# Patient Record
Sex: Male | Born: 1960
Health system: Southern US, Community
[De-identification: ages and names within clinical notes are randomized; demographics above are authoritative.]

## PROBLEM LIST (undated history)

## (undated) DIAGNOSIS — M109 Gout, unspecified: Secondary | ICD-10-CM

## (undated) DIAGNOSIS — I10 Essential (primary) hypertension: Secondary | ICD-10-CM

## (undated) DIAGNOSIS — E119 Type 2 diabetes mellitus without complications: Secondary | ICD-10-CM

## (undated) DIAGNOSIS — G8929 Other chronic pain: Secondary | ICD-10-CM

## (undated) DIAGNOSIS — M25569 Pain in unspecified knee: Secondary | ICD-10-CM

## (undated) DIAGNOSIS — E785 Hyperlipidemia, unspecified: Secondary | ICD-10-CM

## (undated) DIAGNOSIS — Z8739 Personal history of other diseases of the musculoskeletal system and connective tissue: Secondary | ICD-10-CM

## (undated) HISTORY — PX: COLONOSCOPY: SHX174

## (undated) HISTORY — PX: NO PAST SURGERIES: SHX2092

---

## 1999-01-11 ENCOUNTER — Encounter: Payer: Self-pay | Admitting: Emergency Medicine

## 1999-01-11 ENCOUNTER — Emergency Department (HOSPITAL_COMMUNITY): Admission: EM | Admit: 1999-01-11 | Discharge: 1999-01-11 | Payer: Self-pay | Admitting: Emergency Medicine

## 1999-08-12 ENCOUNTER — Emergency Department (HOSPITAL_COMMUNITY): Admission: EM | Admit: 1999-08-12 | Discharge: 1999-08-12 | Payer: Self-pay | Admitting: Emergency Medicine

## 2001-10-23 ENCOUNTER — Emergency Department (HOSPITAL_COMMUNITY): Admission: EM | Admit: 2001-10-23 | Discharge: 2001-10-23 | Payer: Self-pay | Admitting: Emergency Medicine

## 2001-10-23 ENCOUNTER — Encounter: Payer: Self-pay | Admitting: Emergency Medicine

## 2002-01-27 ENCOUNTER — Encounter: Payer: Self-pay | Admitting: Emergency Medicine

## 2002-01-27 ENCOUNTER — Emergency Department (HOSPITAL_COMMUNITY): Admission: EM | Admit: 2002-01-27 | Discharge: 2002-01-27 | Payer: Self-pay | Admitting: Emergency Medicine

## 2003-06-17 ENCOUNTER — Emergency Department (HOSPITAL_COMMUNITY): Admission: EM | Admit: 2003-06-17 | Discharge: 2003-06-17 | Payer: Self-pay | Admitting: *Deleted

## 2003-08-24 ENCOUNTER — Emergency Department (HOSPITAL_COMMUNITY): Admission: EM | Admit: 2003-08-24 | Discharge: 2003-08-24 | Payer: Self-pay | Admitting: Emergency Medicine

## 2003-09-15 ENCOUNTER — Emergency Department (HOSPITAL_COMMUNITY): Admission: EM | Admit: 2003-09-15 | Discharge: 2003-09-15 | Payer: Self-pay | Admitting: Emergency Medicine

## 2004-04-16 ENCOUNTER — Emergency Department (HOSPITAL_COMMUNITY): Admission: EM | Admit: 2004-04-16 | Discharge: 2004-04-16 | Payer: Self-pay | Admitting: Emergency Medicine

## 2004-10-04 ENCOUNTER — Emergency Department (HOSPITAL_COMMUNITY): Admission: EM | Admit: 2004-10-04 | Discharge: 2004-10-04 | Payer: Self-pay | Admitting: Emergency Medicine

## 2005-11-27 ENCOUNTER — Emergency Department (HOSPITAL_COMMUNITY): Admission: EM | Admit: 2005-11-27 | Discharge: 2005-11-27 | Payer: Self-pay | Admitting: Emergency Medicine

## 2005-12-07 ENCOUNTER — Emergency Department (HOSPITAL_COMMUNITY): Admission: EM | Admit: 2005-12-07 | Discharge: 2005-12-07 | Payer: Self-pay | Admitting: Emergency Medicine

## 2009-12-18 ENCOUNTER — Inpatient Hospital Stay (HOSPITAL_COMMUNITY): Admission: EM | Admit: 2009-12-18 | Discharge: 2009-12-23 | Payer: Self-pay | Admitting: Emergency Medicine

## 2010-05-21 ENCOUNTER — Emergency Department (HOSPITAL_COMMUNITY): Admission: EM | Admit: 2010-05-21 | Discharge: 2010-05-21 | Payer: Self-pay | Admitting: Emergency Medicine

## 2011-02-03 LAB — DIFFERENTIAL
Basophils Absolute: 0 10*3/uL (ref 0.0–0.1)
Basophils Absolute: 0 10*3/uL (ref 0.0–0.1)
Basophils Relative: 0 % (ref 0–1)
Eosinophils Absolute: 0 10*3/uL (ref 0.0–0.7)
Eosinophils Absolute: 0 10*3/uL (ref 0.0–0.7)
Eosinophils Relative: 0 % (ref 0–5)
Eosinophils Relative: 0 % (ref 0–5)
Lymphocytes Relative: 25 % (ref 12–46)
Lymphs Abs: 1.9 10*3/uL (ref 0.7–4.0)
Monocytes Absolute: 0.5 10*3/uL (ref 0.1–1.0)
Monocytes Absolute: 1.1 10*3/uL — ABNORMAL HIGH (ref 0.1–1.0)
Monocytes Relative: 9 % (ref 3–12)
Neutrophils Relative %: 69 % (ref 43–77)

## 2011-02-03 LAB — POCT I-STAT, CHEM 8
BUN: 14 mg/dL (ref 6–23)
Calcium, Ion: 1.12 mmol/L (ref 1.12–1.32)
Chloride: 108 mEq/L (ref 96–112)
HCT: 42 % (ref 39.0–52.0)
Hemoglobin: 14.3 g/dL (ref 13.0–17.0)
TCO2: 24 mmol/L (ref 0–100)

## 2011-02-03 LAB — SEDIMENTATION RATE: Sed Rate: 102 mm/hr — ABNORMAL HIGH (ref 0–16)

## 2011-02-03 LAB — CBC
Hemoglobin: 12.4 g/dL — ABNORMAL LOW (ref 13.0–17.0)
MCH: 24.4 pg — ABNORMAL LOW (ref 26.0–34.0)
MCHC: 32.3 g/dL (ref 30.0–36.0)
MCV: 75 fL — ABNORMAL LOW (ref 78.0–100.0)
MCV: 76.6 fL — ABNORMAL LOW (ref 78.0–100.0)
RBC: 5.05 MIL/uL (ref 4.22–5.81)
RBC: 4.99 MIL/uL (ref 4.22–5.81)
RDW: 15.2 % (ref 11.5–15.5)
WBC: 7.8 10*3/uL (ref 4.0–10.5)
WBC: 12 10*3/uL — ABNORMAL HIGH (ref 4.0–10.5)

## 2011-02-03 LAB — GLUCOSE, CAPILLARY

## 2011-02-03 LAB — BASIC METABOLIC PANEL
Creatinine, Ser: 0.95 mg/dL (ref 0.4–1.5)
GFR calc Af Amer: >60 mL/min (ref >60)
Glucose, Bld: 161 mg/dL — ABNORMAL HIGH (ref 70–99)

## 2011-02-06 LAB — IRON AND TIBC
Iron: 24 ug/dL — ABNORMAL LOW (ref 42–135)
Saturation Ratios: 14 % — ABNORMAL LOW (ref 20–55)
TIBC: 175 ug/dL — ABNORMAL LOW (ref 215–435)

## 2011-02-06 LAB — CBC
HCT: 32.6 % — ABNORMAL LOW (ref 39.0–52.0)
HCT: 35.6 % — ABNORMAL LOW (ref 39.0–52.0)
Hemoglobin: 10.8 g/dL — ABNORMAL LOW (ref 13.0–17.0)
Hemoglobin: 11 g/dL — ABNORMAL LOW (ref 13.0–17.0)
MCHC: 32.3 g/dL (ref 30.0–36.0)
MCHC: 32.9 g/dL (ref 30.0–36.0)
MCV: 76.2 fL — ABNORMAL LOW (ref 78.0–100.0)
MCV: 76.4 fL — ABNORMAL LOW (ref 78.0–100.0)
MCV: 76.8 fL — ABNORMAL LOW (ref 78.0–100.0)
Platelets: 272 10*3/uL (ref 150–400)
Platelets: 302 10*3/uL (ref 150–400)
RDW: 14.7 % (ref 11.5–15.5)
RDW: 14.8 % (ref 11.5–15.5)
RDW: 15 % (ref 11.5–15.5)
WBC: 10.9 10*3/uL — ABNORMAL HIGH (ref 4.0–10.5)

## 2011-02-06 LAB — DIFFERENTIAL
Basophils Absolute: 0 10*3/uL (ref 0.0–0.1)
Basophils Absolute: 0 10*3/uL (ref 0.0–0.1)
Basophils Relative: 0 % (ref 0–1)
Eosinophils Absolute: 0 10*3/uL (ref 0.0–0.7)
Eosinophils Absolute: 0 10*3/uL (ref 0.0–0.7)
Eosinophils Relative: 0 % (ref 0–5)
Lymphocytes Relative: 22 % (ref 12–46)
Monocytes Absolute: 0.6 10*3/uL (ref 0.1–1.0)
Monocytes Absolute: 0.7 10*3/uL (ref 0.1–1.0)
Neutro Abs: 10.8 10*3/uL — ABNORMAL HIGH (ref 1.7–7.7)

## 2011-02-06 LAB — URINALYSIS, MICROSCOPIC ONLY
Glucose, UA: NEGATIVE mg/dL
Ketones, ur: NEGATIVE mg/dL
Leukocytes, UA: NEGATIVE
Nitrite: NEGATIVE
Protein, ur: NEGATIVE mg/dL
Urobilinogen, UA: 1 mg/dL (ref 0.0–1.0)

## 2011-02-06 LAB — GLUCOSE, CAPILLARY
Glucose-Capillary: 109 mg/dL — ABNORMAL HIGH (ref 70–99)
Glucose-Capillary: 121 mg/dL — ABNORMAL HIGH (ref 70–99)
Glucose-Capillary: 124 mg/dL — ABNORMAL HIGH (ref 70–99)
Glucose-Capillary: 134 mg/dL — ABNORMAL HIGH (ref 70–99)
Glucose-Capillary: 141 mg/dL — ABNORMAL HIGH (ref 70–99)
Glucose-Capillary: 141 mg/dL — ABNORMAL HIGH (ref 70–99)
Glucose-Capillary: 167 mg/dL — ABNORMAL HIGH (ref 70–99)
Glucose-Capillary: 174 mg/dL — ABNORMAL HIGH (ref 70–99)
Glucose-Capillary: 183 mg/dL — ABNORMAL HIGH (ref 70–99)

## 2011-02-06 LAB — BASIC METABOLIC PANEL
BUN: 17 mg/dL (ref 6–23)
BUN: 17 mg/dL (ref 6–23)
CO2: 26 mEq/L (ref 19–32)
CO2: 27 mEq/L (ref 19–32)
Calcium: 8.8 mg/dL (ref 8.4–10.5)
Chloride: 103 mEq/L (ref 96–112)
Creatinine, Ser: 0.77 mg/dL (ref 0.4–1.5)
Glucose, Bld: 142 mg/dL — ABNORMAL HIGH (ref 70–99)
Glucose, Bld: 97 mg/dL (ref 70–99)
Potassium: 4 mEq/L (ref 3.5–5.1)
Sodium: 136 mEq/L (ref 135–145)

## 2011-02-06 LAB — RENAL FUNCTION PANEL
Albumin: 2.3 g/dL — ABNORMAL LOW (ref 3.5–5.2)
CO2: 27 mEq/L (ref 19–32)
GFR calc Af Amer: >60 mL/min (ref >60)
Glucose, Bld: 134 mg/dL — ABNORMAL HIGH (ref 70–99)
Potassium: 4 mEq/L (ref 3.5–5.1)

## 2011-02-06 LAB — URINE CULTURE
Colony Count: NO GROWTH
Culture: NO GROWTH

## 2011-02-06 LAB — ANAEROBIC CULTURE
Gram Stain: NONE SEEN
Gram Stain: NONE SEEN

## 2011-02-06 LAB — HEMOGLOBIN A1C: Hgb A1c MFr Bld: 8.3 % — ABNORMAL HIGH (ref 4.6–6.1)

## 2011-02-06 LAB — SYNOVIAL CELL COUNT + DIFF, W/ CRYSTALS: Crystals, Fluid: NONE SEEN

## 2011-02-06 LAB — BODY FLUID CULTURE
Culture: NO GROWTH
Gram Stain: NONE SEEN

## 2011-02-06 LAB — RETICULOCYTES: RBC.: 4.79 MIL/uL (ref 4.22–5.81)

## 2011-06-14 ENCOUNTER — Inpatient Hospital Stay (HOSPITAL_COMMUNITY)
Admission: EM | Admit: 2011-06-14 | Discharge: 2011-06-20 | DRG: 638 | Disposition: A | Discharge status: Home / Self Care | Payer: Self-pay | Attending: Internal Medicine | Admitting: Internal Medicine

## 2011-06-14 DIAGNOSIS — Z79899 Other long term (current) drug therapy: Secondary | ICD-10-CM

## 2011-06-14 DIAGNOSIS — K5289 Other specified noninfective gastroenteritis and colitis: Secondary | ICD-10-CM | POA: Diagnosis present

## 2011-06-14 DIAGNOSIS — E785 Hyperlipidemia, unspecified: Secondary | ICD-10-CM | POA: Diagnosis present

## 2011-06-14 DIAGNOSIS — N179 Acute kidney failure, unspecified: Secondary | ICD-10-CM | POA: Diagnosis present

## 2011-06-14 DIAGNOSIS — E1310 Other specified diabetes mellitus with ketoacidosis without coma: Principal | ICD-10-CM | POA: Diagnosis present

## 2011-06-14 DIAGNOSIS — E860 Dehydration: Secondary | ICD-10-CM | POA: Diagnosis present

## 2011-06-14 DIAGNOSIS — K219 Gastro-esophageal reflux disease without esophagitis: Secondary | ICD-10-CM | POA: Diagnosis present

## 2011-06-14 DIAGNOSIS — M109 Gout, unspecified: Secondary | ICD-10-CM | POA: Diagnosis present

## 2011-06-14 DIAGNOSIS — I10 Essential (primary) hypertension: Secondary | ICD-10-CM | POA: Diagnosis present

## 2011-06-14 LAB — POCT I-STAT 3, VENOUS BLOOD GAS (G3P V)
Patient temperature: 98
TCO2: 14 mmol/L (ref 0–100)
pCO2, Ven: 28.4 mmHg — ABNORMAL LOW (ref 45.0–50.0)
pH, Ven: 7.255 (ref 7.250–7.300)

## 2011-06-14 LAB — DIFFERENTIAL
Basophils Absolute: 0 10*3/uL (ref 0.0–0.1)
Basophils Relative: 0 % (ref 0–1)
Eosinophils Absolute: 0 10*3/uL (ref 0.0–0.7)
Eosinophils Relative: 0 % (ref 0–5)
Monocytes Absolute: 0.3 10*3/uL (ref 0.1–1.0)
Monocytes Relative: 5 % (ref 3–12)

## 2011-06-14 LAB — CBC
MCH: 25 pg — ABNORMAL LOW (ref 26.0–34.0)
MCHC: 34.6 g/dL (ref 30.0–36.0)
Platelets: 222 10*3/uL (ref 150–400)
RDW: 14 % (ref 11.5–15.5)

## 2011-06-14 LAB — GLUCOSE, CAPILLARY: Glucose-Capillary: >600 mg/dL (ref 70–99)

## 2011-06-15 ENCOUNTER — Emergency Department (HOSPITAL_COMMUNITY): Payer: Self-pay

## 2011-06-15 LAB — BASIC METABOLIC PANEL
BUN: 49 mg/dL — ABNORMAL HIGH (ref 6–23)
BUN: 37 mg/dL — ABNORMAL HIGH (ref 6–23)
Calcium: 9.4 mg/dL (ref 8.4–10.5)
Chloride: 88 mEq/L — ABNORMAL LOW (ref 96–112)
Creatinine, Ser: 1.74 mg/dL — ABNORMAL HIGH (ref 0.50–1.35)
GFR calc Af Amer: >60 mL/min (ref >60)
GFR calc non Af Amer: 42 mL/min — ABNORMAL LOW (ref >60)
Glucose, Bld: 260 mg/dL — ABNORMAL HIGH (ref 70–99)
Glucose, Bld: 409 mg/dL — ABNORMAL HIGH (ref 70–99)
Potassium: 3.4 mEq/L — ABNORMAL LOW (ref 3.5–5.1)
Sodium: 127 mEq/L — ABNORMAL LOW (ref 135–145)

## 2011-06-15 LAB — GLUCOSE, CAPILLARY
Glucose-Capillary: 210 mg/dL — ABNORMAL HIGH (ref 70–99)
Glucose-Capillary: 264 mg/dL — ABNORMAL HIGH (ref 70–99)
Glucose-Capillary: 304 mg/dL — ABNORMAL HIGH (ref 70–99)
Glucose-Capillary: 326 mg/dL — ABNORMAL HIGH (ref 70–99)
Glucose-Capillary: 374 mg/dL — ABNORMAL HIGH (ref 70–99)
Glucose-Capillary: 426 mg/dL — ABNORMAL HIGH (ref 70–99)
Glucose-Capillary: >600 mg/dL (ref 70–99)

## 2011-06-15 LAB — URINALYSIS, ROUTINE W REFLEX MICROSCOPIC
Ketones, ur: 40 mg/dL — AB
Leukocytes, UA: NEGATIVE
Nitrite: NEGATIVE
Protein, ur: NEGATIVE mg/dL
pH: 5.5 (ref 5.0–8.0)

## 2011-06-15 LAB — COMPREHENSIVE METABOLIC PANEL
Albumin: 4.2 g/dL (ref 3.5–5.2)
BUN: 62 mg/dL — ABNORMAL HIGH (ref 6–23)
Calcium: 10.3 mg/dL (ref 8.4–10.5)
Chloride: 71 mEq/L — ABNORMAL LOW (ref 96–112)
Creatinine, Ser: 2.2 mg/dL — ABNORMAL HIGH (ref 0.50–1.35)
Total Bilirubin: 0.4 mg/dL (ref 0.3–1.2)

## 2011-06-15 LAB — LIPASE, BLOOD: Lipase: 153 U/L — ABNORMAL HIGH (ref 11–59)

## 2011-06-15 LAB — CK TOTAL AND CKMB (NOT AT ARMC)
CK, MB: 4.3 ng/mL — ABNORMAL HIGH (ref 0.3–4.0)
Total CK: 122 U/L (ref 7–232)

## 2011-06-15 LAB — URINE MICROSCOPIC-ADD ON

## 2011-06-16 LAB — BASIC METABOLIC PANEL
CO2: 24 mEq/L (ref 19–32)
Chloride: 96 mEq/L (ref 96–112)
Creatinine, Ser: 1.21 mg/dL (ref 0.50–1.35)
GFR calc Af Amer: >60 mL/min (ref >60)
GFR calc non Af Amer: >60 mL/min (ref >60)
Glucose, Bld: 428 mg/dL — ABNORMAL HIGH (ref 70–99)
Potassium: 4.1 mEq/L (ref 3.5–5.1)
Sodium: 129 mEq/L — ABNORMAL LOW (ref 135–145)

## 2011-06-16 LAB — CBC
HCT: 36.7 % — ABNORMAL LOW (ref 39.0–52.0)
MCV: 69.4 fL — ABNORMAL LOW (ref 78.0–100.0)
Platelets: 163 10*3/uL (ref 150–400)
RBC: 5.29 MIL/uL (ref 4.22–5.81)
WBC: 8.9 10*3/uL (ref 4.0–10.5)

## 2011-06-16 LAB — GLUCOSE, CAPILLARY
Glucose-Capillary: 311 mg/dL — ABNORMAL HIGH (ref 70–99)
Glucose-Capillary: 252 mg/dL — ABNORMAL HIGH (ref 70–99)

## 2011-06-16 LAB — HEMOGLOBIN A1C: Hgb A1c MFr Bld: 17.1 % — ABNORMAL HIGH (ref <5.7)

## 2011-06-17 LAB — GLUCOSE, CAPILLARY
Glucose-Capillary: >600 mg/dL (ref 70–99)
Glucose-Capillary: 370 mg/dL — ABNORMAL HIGH (ref 70–99)
Glucose-Capillary: 329 mg/dL — ABNORMAL HIGH (ref 70–99)

## 2011-06-17 LAB — CBC
MCH: 24.2 pg — ABNORMAL LOW (ref 26.0–34.0)
MCHC: 34.3 g/dL (ref 30.0–36.0)
MCV: 70.6 fL — ABNORMAL LOW (ref 78.0–100.0)
Platelets: 134 10*3/uL — ABNORMAL LOW (ref 150–400)
RDW: 13.5 % (ref 11.5–15.5)
WBC: 7.9 10*3/uL (ref 4.0–10.5)

## 2011-06-17 LAB — BASIC METABOLIC PANEL
BUN: 15 mg/dL (ref 6–23)
Calcium: 9.4 mg/dL (ref 8.4–10.5)
Creatinine, Ser: 1.02 mg/dL (ref 0.50–1.35)
GFR calc Af Amer: >60 mL/min (ref >60)
GFR calc non Af Amer: >60 mL/min (ref >60)

## 2011-06-18 LAB — GLUCOSE, CAPILLARY
Glucose-Capillary: 370 mg/dL — ABNORMAL HIGH (ref 70–99)
Glucose-Capillary: 274 mg/dL — ABNORMAL HIGH (ref 70–99)
Glucose-Capillary: 239 mg/dL — ABNORMAL HIGH (ref 70–99)

## 2011-06-18 LAB — LIPID PANEL
Cholesterol: 192 mg/dL (ref 0–200)
Triglycerides: 181 mg/dL — ABNORMAL HIGH (ref <150)

## 2011-06-18 LAB — TSH: TSH: 1.24 u[IU]/mL (ref 0.350–4.500)

## 2011-06-19 LAB — GLUCOSE, CAPILLARY
Glucose-Capillary: 332 mg/dL — ABNORMAL HIGH (ref 70–99)
Glucose-Capillary: 249 mg/dL — ABNORMAL HIGH (ref 70–99)

## 2011-06-20 LAB — BASIC METABOLIC PANEL
CO2: 26 mEq/L (ref 19–32)
Calcium: 9.5 mg/dL (ref 8.4–10.5)
Creatinine, Ser: 0.99 mg/dL (ref 0.50–1.35)

## 2011-06-20 LAB — GLUCOSE, CAPILLARY: Glucose-Capillary: 311 mg/dL — ABNORMAL HIGH (ref 70–99)

## 2011-06-23 NOTE — Discharge Summary (Signed)
[name], [name]                ACCOUNT NO.:  [id number]  MEDICAL RECORD NO.:  [id number]  LOCATION:  2924                         FACILITY:  [facility]  PHYSICIAN:  Sherryl Manges, M.D.  DATE OF BIRTH:  1961/09/15  DATE OF ADMISSION:  06/14/2011 DATE OF DISCHARGE:  06/20/2011                              DISCHARGE SUMMARY   PRIMARY MD:  None.  DISCHARGE DIAGNOSES: 1. Newly diagnosed type 2 diabetes mellitus. 2. Diabetic ketoacidosis, secondary to newly diagnosed type 2 diabetes     mellitus above. 3. Acute gastroenteritis, likely precipitant of diabetic ketoacidosis. 4. Hypertension. 5. Gout.  DISCHARGE MEDICATIONS: 1. Aspirin enteric-coated 81 mg p.o. daily OTC. 2. NovoLog mix (70/30) 40 units subcutaneously b.i.d. with meals. 3. Insulin syringes 50 units as directed. 4. Metformin 500 mg p.o. b.i.d. with meals. 5. Simvastatin 10 mg p.o. daily. 6. Lisinopril 5 mg p.o. daily (was on 10 mg p.o. daily). 7. Allopurinol 100 mg p.o. daily. 8. Atenolol 50 mg p.o. daily. 9. Diclofenac 75 mg p.o. p.r.n. for knee pain. 10.Prevacid 15 mg p.o. daily.  Note:  Promethazine and hydrochlorothiazide have been discontinued.  PROCEDURES:  Chest x-ray, June 15, 2011.  This was a normal study.  CONSULTATIONS:  None.  ADMISSION HISTORY:  As in H and P notes of June 14, 2011, dictated by Dr. Orvan Falconer.  However, in brief, this is a 50 year old male, with known history of hypertension and gout, who presents to the emergency department with a history of nausea, vomiting, and diarrhea for 2 days, increasing weakness, polydipsia, polyuria, lightheadedness without fever or chills.  On initial evaluation in the emergency department, he was found to have a random blood glucose of 894, creatinine 2.2, bicarbonate 10, and potassium 5.5.  He was admitted for further evaluation, investigation, and management.  CLINICAL COURSE: 1. Diabetic ketoacidosis.  The patient has no known prior history of  diabetes mellitus.  He presents as described above and was found to     have a random blood glucose of 894 and acidosis.  He was managed     initially with glucose stabilizer protocol and aggressive     intravenous fluid hydration, with improvement in blood glucose     levels.  He was transitioned to NovoLog mix (70/30) and dosage was     titrated according to CBGs.  Appropriate diet was instituted.  As     of June 20, 2011, with judicious titration, his CBGs were down to     192.  Further improvement is anticipated.  2. Type 2 diabetes mellitus.  As described above, this is a new     diagnosis.  The patient underwent diabetic teaching during the     course of this hospitalization and his hemoglobin A1c was found to     be 17.1.  3. Acute gastroenteritis.  This was a culprit for #1 above and     responded to intravenous fluid hydration, antiemetics, and proton     pump inhibitors.  As of June 19, 2011, the patient was     asymptomatic. Likely, this was viral in origin and self-limited.  4. Hypertension.  The patient was managed with a combination of  beta-     blocker and low-dose ACE inhibitor.  5. Gout.  The patient remained asymptomatic from this viewpoint.  DISPOSITION:  The patient was on June 20, 2011, asymptomatic, was very keen to be discharged with no new issues, and was discharged accordingly.  ACTIVITY:  As tolerated.  DIET:  Heart healthy/carbohydrate modified.  FOLLOWUP INSTRUCTIONS:  The patient has been arranged a followup appointment with [facility].  I believe it will be on July 08, 2011, at 2:30 p.m.  Telephone [phone].  After that, he will follow up with Dr. Minna Antis at [facility] on July 15, 2011, at 2:30 p.m.  Telephone [phone].  All this has been communicated to the patient, he verbalized understanding.     Sherryl Manges, M.D.     CO/MEDQ  D:  06/20/2011  T:  06/21/2011  Job:  [id number]  cc:   Dr. Minna Antis  Electronically Signed by  Sherryl Manges M.D. on 06/23/2011 06:57:12 PM

## 2011-07-09 NOTE — H&P (Signed)
[name], [name]                ACCOUNT NO.:  [id number]  MEDICAL RECORD NO.:  [id number]  LOCATION:  [facility]                         FACILITY:  [facility]  PHYSICIAN:  Orvan Falconer, MD           DATE OF BIRTH:  1960-12-22  DATE OF ADMISSION:  06/14/2011 DATE OF DISCHARGE:                             HISTORY & PHYSICAL   PRIMARY CARE PHYSICIAN:  None.  ADVANCE DIRECTIVE:  Full code.  REASON FOR ADMISSION:  Hyperosmolar hyperglycemia with possible DKA.  HISTORY OF PRESENT ILLNESS:  This is a 50 year old male with history of hypertension and gout, presents to the emergency room complaining of nausea, vomiting and loose stool for 2 days.  He had been feeling very weak, having polydipsia and polyuria and lightheadedness.  He denied any fever or chills, cough, chest pain or shortness of breath.  He had never been diagnosed with diabetes before.  Evaluation in the emergency room showed blood glucose of 894 with creatinine of 2.2, bicarb of 10.  His potassium is 5.5.  His urinalysis is positive for glycosuria but otherwise no evidence of infection.  A venous pH was done and it was 7.23.  He has a white count of 7.0 thousand, hemoglobin of 14.5.  He has not had a chest x-ray, EKG or cardiac markers performed in the emergency room.  Hospitalist was asked to admit this patient for DKA.  PAST MEDICAL HISTORY:  Hypertension and gout.  CURRENT MEDICATIONS:  Allopurinol, atenolol, hydrochlorothiazide, lisinopril, Prevacid, Phenergan.  FAMILY HISTORY:  Father with diabetes.  Mother is alive and well.  SOCIAL HISTORY:  He works in [employer].  He is not married.  He has no children.  Denied tobacco, alcohol or drug use.  ALLERGIES:  No known drug allergies.  PHYSICAL EXAMINATION:  VITAL SIGNS:  Blood pressure 120/70, pulse of 68, respiratory rate of 20, temperature 98.0. GENERAL:  He is alert, orient and conversing but he appeared to be tired.  Skin turgor was poor. HEENT:  Sclerae are  nonicteric. NECK:  No lymphadenopathy. CARDIAC:  S1 and S2 regular.  No murmur, rub or gallop. LUNGS:  Clear.  No wheezes, rales or any evidence of consolidation. ABDOMEN:  Soft, nondistended, nontender.  No right upper quadrant tenderness.  No ascites. EXTREMITIES:  No edema.  He has no joint swelling or rash.  No calf tenderness. SKIN:  Warm and dry.  No evidence of shock. NEUROLOGIC:  Unremarkable. PSYCHIATRIC:  Unremarkable as well.  OBJECTIVE FINDINGS:  Serum sodium of 120, potassium of 5.5, chloride 71, bicarb of 10, blood glucose of 894, creatinine 2.2, BUN of 62.  Liver function tests are normal.  Venous pH is 7.25.  White count of 7.0 thousand, hemoglobin of 14.5, platelet count of 222,000.  IMPRESSION:  This is a 50 year old male presents with what appeared to be DKA, but because he had diarrhea, he might have had metabolic acidosis from the diarrhea, and clinically, he presents more like hyperosmolar hyperglycemia.  In any event, he will need fluid and insulin with more need for fluid than insulin.  We will admit him to the step-down unit. Because MI is a possible common cause  for hyperglycemia, he will need EKG and cardiac markers.  We will also get a chest x-ray to make sure he do not have occult pneumonia.  He is quite ill but stable.  We will use glucose stabilizer and IV fluid and we will admit him to [facility].  He is a full code.  When his creatinine normalizes, and he is able to have his medications reconciled, we will resume his medications pending improvements of his kidneys function.     Orvan Falconer, MD     PL/MEDQ  D:  06/15/2011  T:  06/15/2011  Job:  [id number]  Electronically Signed by Orvan Falconer  on 07/09/2011 10:09:57 PM

## 2013-07-15 ENCOUNTER — Emergency Department (HOSPITAL_COMMUNITY): Payer: Self-pay

## 2013-07-15 ENCOUNTER — Emergency Department (HOSPITAL_COMMUNITY)
Admission: EM | Admit: 2013-07-15 | Discharge: 2013-07-15 | Disposition: A | Discharge status: Home / Self Care | Payer: Self-pay | Attending: Emergency Medicine | Admitting: Emergency Medicine

## 2013-07-15 ENCOUNTER — Encounter (HOSPITAL_COMMUNITY): Payer: Self-pay | Admitting: Emergency Medicine

## 2013-07-15 DIAGNOSIS — M7021 Olecranon bursitis, right elbow: Secondary | ICD-10-CM

## 2013-07-15 DIAGNOSIS — M7020 Olecranon bursitis, unspecified elbow: Secondary | ICD-10-CM | POA: Insufficient documentation

## 2013-07-15 DIAGNOSIS — W2209XA Striking against other stationary object, initial encounter: Secondary | ICD-10-CM | POA: Insufficient documentation

## 2013-07-15 DIAGNOSIS — Y9389 Activity, other specified: Secondary | ICD-10-CM | POA: Insufficient documentation

## 2013-07-15 DIAGNOSIS — E119 Type 2 diabetes mellitus without complications: Secondary | ICD-10-CM | POA: Insufficient documentation

## 2013-07-15 DIAGNOSIS — I10 Essential (primary) hypertension: Secondary | ICD-10-CM | POA: Insufficient documentation

## 2013-07-15 DIAGNOSIS — Y92009 Unspecified place in unspecified non-institutional (private) residence as the place of occurrence of the external cause: Secondary | ICD-10-CM | POA: Insufficient documentation

## 2013-07-15 HISTORY — DX: Essential (primary) hypertension: I10

## 2013-07-15 LAB — CBC WITH DIFFERENTIAL/PLATELET
Basophils Absolute: 0 10*3/uL (ref 0.0–0.1)
Basophils Relative: 0 % (ref 0–1)
Eosinophils Absolute: 0 10*3/uL (ref 0.0–0.7)
Eosinophils Relative: 0 % (ref 0–5)
HCT: 38.9 % — ABNORMAL LOW (ref 39.0–52.0)
Hemoglobin: 12.3 g/dL — ABNORMAL LOW (ref 13.0–17.0)
Lymphocytes Relative: 20 % (ref 12–46)
Lymphs Abs: 1.9 10*3/uL (ref 0.7–4.0)
MCH: 23.9 pg — ABNORMAL LOW (ref 26.0–34.0)
MCHC: 31.6 g/dL (ref 30.0–36.0)
MCV: 75.5 fL — ABNORMAL LOW (ref 78.0–100.0)
Monocytes Absolute: 0.9 10*3/uL (ref 0.1–1.0)
Monocytes Relative: 9 % (ref 3–12)
Neutro Abs: 6.8 10*3/uL (ref 1.7–7.7)
Neutrophils Relative %: 71 % (ref 43–77)
Platelets: 171 10*3/uL (ref 150–400)
RBC: 5.15 MIL/uL (ref 4.22–5.81)
RDW: 15.6 % — ABNORMAL HIGH (ref 11.5–15.5)
WBC: 9.6 10*3/uL (ref 4.0–10.5)

## 2013-07-15 LAB — SEDIMENTATION RATE: Sed Rate: 21 mm/hr — ABNORMAL HIGH (ref 0–16)

## 2013-07-15 LAB — C-REACTIVE PROTEIN: CRP: 4.5 mg/dL — ABNORMAL HIGH (ref <0.60)

## 2013-07-15 MED ORDER — LEVOFLOXACIN 500 MG PO TABS
750.0000 mg | ORAL_TABLET | Freq: Once | ORAL | Status: AC
  Administered 2013-07-15: 750 mg via ORAL
  Filled 2013-07-15: qty 2
  Filled 2013-07-15: qty 1

## 2013-07-15 MED ORDER — KETOROLAC TROMETHAMINE 30 MG/ML IJ SOLN
30.0000 mg | Freq: Once | INTRAMUSCULAR | Status: AC
  Administered 2013-07-15: 30 mg via INTRAMUSCULAR
  Filled 2013-07-15: qty 1

## 2013-07-15 MED ORDER — OXYCODONE-ACETAMINOPHEN 5-325 MG PO TABS: 1.0000 | ORAL_TABLET | ORAL | Status: DC | PRN

## 2013-07-15 MED ORDER — BUPIVACAINE HCL (PF) 0.5 % IJ SOLN
10.0000 mL | Freq: Once | INTRAMUSCULAR | Status: AC
  Administered 2013-07-15: 10 mL
  Filled 2013-07-15: qty 30

## 2013-07-15 MED ORDER — LEVOFLOXACIN 500 MG PO TABS: 500.0000 mg | ORAL_TABLET | Freq: Every day | ORAL | Status: DC

## 2013-07-15 MED ORDER — CEPHALEXIN 500 MG PO CAPS
500.0000 mg | ORAL_CAPSULE | Freq: Once | ORAL | Status: AC
  Administered 2013-07-15: 500 mg via ORAL
  Filled 2013-07-15: qty 1

## 2013-07-15 NOTE — ED Notes (Signed)
Pt presenting to ed with c/o hitting right elbow on rail [place] his apartment [place] x days ago with worsening pain and swelling today pt states difficulty with raising his arm up today

## 2013-07-15 NOTE — ED Provider Notes (Signed)
CSN: [id number]     Arrival date & time 07/15/13  1455 History   First MD Initiated Contact with Patient 07/15/13 1510     Chief Complaint  Patient presents with  . abnormal bloodwork    (Consider location/radiation/quality/duration/timing/severity/associated sxs/prior Treatment) HPI  52 year old male called by myself to come back for further evaluation and workup. Gram stain from early teen sample showed gram variable rods. Possible contaminants. Gram-negative should not be though. Patient reports symptoms have not significantly changed since last evaluation. He is not immunocompromised. Is not systemically ill. Remains afebrile. Will obtain some baseline labs. Will tx empirically until culture resulted.     Past Medical History  Diagnosis Date  . Diabetes mellitus without complication   . Hypertension    History reviewed. No pertinent past surgical history. No family history on file. History  Substance Use Topics  . Smoking status: Never Smoker   . Smokeless tobacco: Not on file  . Alcohol Use: No    Review of Systems  All systems reviewed and negative, other than as noted in HPI.   Allergies  Review of patient's allergies indicates no known allergies.  Home Medications   Current Outpatient Rx  Name  Route  Sig  Dispense  Refill  . oxyCODONE-acetaminophen (PERCOCET/ROXICET) 5-325 MG per tablet   Oral   Take 1-2 tablets by mouth every 4 (four) hours as needed for pain.   17 tablet   0    BP 163/99  Pulse 73  Resp 19  SpO2 99% Physical Exam  Nursing note and vitals reviewed. Constitutional: He appears well-developed and well-nourished. No distress.  HENT:  Head: Normocephalic and atraumatic.  Eyes: Conjunctivae are normal. Right eye exhibits no discharge. Left eye exhibits no discharge.  Neck: Neck supple.  Cardiovascular: Normal rate, regular rhythm and normal heart sounds.  Exam reveals no gallop and no friction rub.   No murmur heard. Pulmonary/Chest:  Effort normal and breath sounds normal. No respiratory distress.  Abdominal: Soft. He exhibits no distension. There is no tenderness.  Musculoskeletal:  Exam consistent with earlier today. ROM somewhat improved. Remains worse with flexion. Mild erythema and fullness over olecranon.   Neurological: He is alert.  Skin: Skin is warm and dry.  Psychiatric: He has a normal mood and affect. His behavior is normal. Thought content normal.    ED Course  Procedures (including critical care time) Labs Review Labs Reviewed  CBC WITH DIFFERENTIAL - Abnormal; Notable for the following:    Hemoglobin 12.3 (*)    HCT 38.9 (*)    MCV 75.5 (*)    MCH 23.9 (*)    RDW 15.6 (*)    All other components within normal limits  SEDIMENTATION RATE - Abnormal; Notable for the following:    Sed Rate 21 (*)    All other components within normal limits  C-REACTIVE PROTEIN - Abnormal; Notable for the following:    CRP 4.5 (*)    All other components within normal limits  CULTURE, BLOOD (ROUTINE X 2)  CULTURE, BLOOD (ROUTINE X 2)   Imaging Review No results found.  MDM   1. Olecranon bursitis of right elbow     Assessment as above. Continue NSAIDs/pain meds. Script for levaquin. Return precautions discussed. Ortho FU.   Virgel Manifold, MD 07/22/13 [phone]

## 2013-07-15 NOTE — ED Notes (Signed)
Pt was seen in ED earlier and was called by [facility] EDP to come back into ED for abnormal lab work.

## 2013-07-15 NOTE — ED Provider Notes (Addendum)
CSN: [id number]     Arrival date & time 07/15/13  0715 History   First MD Initiated Contact with Patient 07/15/13 0725     Chief Complaint  Patient presents with  . Elbow Pain   (Consider location/radiation/quality/duration/timing/severity/associated sxs/prior Treatment) HPI  52 year old male with right elbow pain. On Monday patient bumped his arm against the wall. He noticed no laceration or abrasion. Had some mild localized discomfort at initially but nothing particularly concerning to him. Over the past 2 days he's had increasing pain, swelling and some redness over the elbow. No fevers or chills. No numbness, tingling or loss of strength. Has tried taking ibuprofen with some relief.  Past Medical History  Diagnosis Date  . Diabetes mellitus without complication   . Hypertension    History reviewed. No pertinent past surgical history. No family history on file. History  Substance Use Topics  . Smoking status: Never Smoker   . Smokeless tobacco: Not on file  . Alcohol Use: No    Review of Systems  All systems reviewed and negative, other than as noted in HPI.   Allergies  Review of patient's allergies indicates no known allergies.  Home Medications  No current outpatient prescriptions on file. BP 194/97  Pulse 77  Temp(Src) 98.9 F (37.2 C) (Oral)  Resp 20  SpO2 99% Physical Exam  Nursing note and vitals reviewed. Constitutional: He appears well-developed and well-nourished. No distress.  HENT:  Head: Normocephalic and atraumatic.  Eyes: Conjunctivae are normal. Right eye exhibits no discharge. Left eye exhibits no discharge.  Neck: Neck supple.  Cardiovascular: Normal rate, regular rhythm and normal heart sounds.  Exam reveals no gallop and no friction rub.   No murmur heard. Pulmonary/Chest: Effort normal and breath sounds normal. No respiratory distress.  Abdominal: Soft. He exhibits no distension. There is no tenderness.  Musculoskeletal: He exhibits no  edema and no tenderness.  Right elbow is erythematous, has increased warmth and swelling over the olecranon. Patient is able to fully extend at the elbow albeit with pain. Pain is increased with flexion. No breaks in skin noted. Neurovascular intact distally.  Neurological: He is alert.  Skin: Skin is warm and dry.  Psychiatric: He has a normal mood and affect. His behavior is normal. Thought content normal.    ED Course  Procedures (including critical care time) Labs Review  Apiration of blood/fluid Performed by: Virgel Manifold Consent obtained. Required items: required blood products, implants, devices, and special equipment available Patient identity confirmed: verbally with patient Time out: Immediately prior to procedure a "time out" was called to verify the correct patient, procedure, equipment, support staff and site/side marked as required. Preparation: Patient was prepped and draped in the usual sterile fashion.  Procedure:Pt's elbow was flexed to 90 degrees resting on bedside table. Small wheal made w/ 1% lidocaine. 18g needle used and directed at most dependent area. 3cc of viscous straw colored fluid obtained and sent for culture.   Patient tolerance: Patient tolerated the procedure well with no immediate complications.  Location of aspiration: R olecranon bursa  Injection of joint Date/Time: 07/15/2013 8:10 AM Performed by: Virgel Manifold Authorized by: Virgel Manifold Consent: Verbal consent obtained. Risks and benefits: risks, benefits and alternatives were discussed Consent given by: patient Required items: required blood products, implants, devices, and special equipment available Patient identity confirmed: verbally with patient and arm band Time out: Immediately prior to procedure a "time out" was called to verify the correct patient, procedure, equipment, support staff and site/side  marked as required. Preparation: Patient was prepped and draped in the usual  sterile fashion. Local anesthesia used: yes Anesthesia: local infiltration Local anesthetic: 50/50 mix of lidocaine 1% without epinephrine and bupivacaine 0.5% without epinephrine. No steroids. Anesthetic total: 3 ml Procedure: Bursa aspirated as above. Once fluid was aspirated, needle was held in position and new syringe containing anesthetic used to inject lido/bupivicaine Patient sedated: no Patient tolerance: Patient tolerated the procedure well with no immediate complications.          Labs Reviewed  BODY FLUID CULTURE   Imaging Review No results found.  MDM   1. Olecranon bursitis of right elbow      52 year old male with right elbow pain after minor trauma on Monday. Exam consistent with olecranon bursitis. Very strong suspicion for traumatic versus less likely septic. Patient is requesting injection so he can go to work. I think this is reasonable. Anesthetic injected after aspiration. No steroids used. We'll send fluid for culture although I do not think that this is infectious. Will treat symptoms. No antibiotics at this time.    Virgel Manifold, MD 07/15/13 [phone]   2:14 PM Received call from [facility]. Reporting that gram stain showed gram variable rods. This may or may not be contaminant.  Called pt at work at [employer]. Explained results and need to come to ED for further evaluation.      Virgel Manifold, MD 07/15/13 1459

## 2013-07-18 LAB — BODY FLUID CULTURE: Special Requests: NORMAL

## 2013-07-21 LAB — CULTURE, BLOOD (ROUTINE X 2)
Culture: NO GROWTH
Culture: NO GROWTH

## 2013-10-13 ENCOUNTER — Ambulatory Visit: Payer: No Typology Code available for payment source | Attending: Internal Medicine | Admitting: Internal Medicine

## 2013-10-13 ENCOUNTER — Encounter: Payer: Self-pay | Admitting: Internal Medicine

## 2013-10-13 VITALS — BP 187/143 | HR 69 | Temp 97.8°F | Resp 14 | Ht 66.0 in | Wt 265.6 lb

## 2013-10-13 DIAGNOSIS — I10 Essential (primary) hypertension: Secondary | ICD-10-CM | POA: Insufficient documentation

## 2013-10-13 DIAGNOSIS — E119 Type 2 diabetes mellitus without complications: Secondary | ICD-10-CM | POA: Insufficient documentation

## 2013-10-13 LAB — LIPID PANEL
LDL Cholesterol: 89 mg/dL (ref 0–99)
Triglycerides: 113 mg/dL (ref <150)

## 2013-10-13 LAB — COMPLETE METABOLIC PANEL WITH GFR
AST: 28 U/L (ref 0–37)
Alkaline Phosphatase: 55 U/L (ref 39–117)
BUN: 22 mg/dL (ref 6–23)
Creat: 1.18 mg/dL (ref 0.50–1.35)

## 2013-10-13 LAB — CK TOTAL AND CKMB (NOT AT ARMC)
Relative Index: 2.1 (ref 0.0–4.0)
Total CK: 481 U/L — ABNORMAL HIGH (ref 7–232)

## 2013-10-13 LAB — URIC ACID: Uric Acid, Serum: 9.9 mg/dL — ABNORMAL HIGH (ref 4.0–7.8)

## 2013-10-13 LAB — CBC WITH DIFFERENTIAL/PLATELET
Basophils Absolute: 0 10*3/uL (ref 0.0–0.1)
Basophils Relative: 0 % (ref 0–1)
HCT: 42.7 % (ref 39.0–52.0)
MCHC: 31.9 g/dL (ref 30.0–36.0)
Monocytes Absolute: 0.6 10*3/uL (ref 0.1–1.0)
Neutro Abs: 3.4 10*3/uL (ref 1.7–7.7)
Neutrophils Relative %: 56 % (ref 43–77)
RDW: 17.4 % — ABNORMAL HIGH (ref 11.5–15.5)

## 2013-10-13 MED ORDER — INSULIN ASPART PROT & ASPART (70-30 MIX) 100 UNIT/ML ~~LOC~~ SUSP: 40.0000 [IU] | Freq: Two times a day (BID) | SUBCUTANEOUS | Status: DC

## 2013-10-13 MED ORDER — SIMVASTATIN 20 MG PO TABS: 20.0000 mg | ORAL_TABLET | Freq: Every evening | ORAL | Status: DC

## 2013-10-13 MED ORDER — MELOXICAM 15 MG PO TABS: 15.0000 mg | ORAL_TABLET | Freq: Every day | ORAL | Status: DC

## 2013-10-13 MED ORDER — TRIAMTERENE-HCTZ 37.5-25 MG PO TABS: 1.0000 | ORAL_TABLET | Freq: Every day | ORAL | Status: DC

## 2013-10-13 MED ORDER — TRAMADOL HCL 50 MG PO TABS: 50.0000 mg | ORAL_TABLET | Freq: Four times a day (QID) | ORAL | Status: DC | PRN

## 2013-10-13 MED ORDER — METFORMIN HCL 500 MG PO TABS: 500.0000 mg | ORAL_TABLET | Freq: Two times a day (BID) | ORAL | Status: DC

## 2013-10-13 MED ORDER — ATENOLOL 50 MG PO TABS: 50.0000 mg | ORAL_TABLET | Freq: Every day | ORAL | Status: DC

## 2013-10-13 MED ORDER — DICLOFENAC SODIUM 75 MG PO TBEC: 75.0000 mg | DELAYED_RELEASE_TABLET | Freq: Two times a day (BID) | ORAL | Status: DC

## 2013-10-13 NOTE — Progress Notes (Signed)
Pt is here to establish care. Pt is hypertensive and a diabetic. Hasn't had an HBA1c x2 years. Pt requests check up.

## 2013-10-14 LAB — VITAMIN D 25 HYDROXY (VIT D DEFICIENCY, FRACTURES): Vit D, 25-Hydroxy: 19 ng/mL — ABNORMAL LOW (ref 30–89)

## 2014-01-13 ENCOUNTER — Ambulatory Visit: Payer: Self-pay | Admitting: Internal Medicine

## 2015-03-17 ENCOUNTER — Encounter (HOSPITAL_COMMUNITY): Payer: Self-pay | Admitting: Emergency Medicine

## 2015-03-17 ENCOUNTER — Emergency Department (HOSPITAL_COMMUNITY)
Admission: EM | Admit: 2015-03-17 | Discharge: 2015-03-17 | Disposition: A | Discharge status: Home / Self Care | Payer: Worker's Compensation | Attending: Emergency Medicine | Admitting: Emergency Medicine

## 2015-03-17 ENCOUNTER — Emergency Department (HOSPITAL_COMMUNITY): Payer: Worker's Compensation

## 2015-03-17 DIAGNOSIS — Z79899 Other long term (current) drug therapy: Secondary | ICD-10-CM | POA: Diagnosis not present

## 2015-03-17 DIAGNOSIS — T1490XA Injury, unspecified, initial encounter: Secondary | ICD-10-CM

## 2015-03-17 DIAGNOSIS — S6991XA Unspecified injury of right wrist, hand and finger(s), initial encounter: Secondary | ICD-10-CM | POA: Diagnosis present

## 2015-03-17 DIAGNOSIS — Y9289 Other specified places as the place of occurrence of the external cause: Secondary | ICD-10-CM | POA: Insufficient documentation

## 2015-03-17 DIAGNOSIS — Z791 Long term (current) use of non-steroidal anti-inflammatories (NSAID): Secondary | ICD-10-CM | POA: Diagnosis not present

## 2015-03-17 DIAGNOSIS — Y990 Civilian activity done for income or pay: Secondary | ICD-10-CM | POA: Diagnosis not present

## 2015-03-17 DIAGNOSIS — Z794 Long term (current) use of insulin: Secondary | ICD-10-CM | POA: Diagnosis not present

## 2015-03-17 DIAGNOSIS — Y9389 Activity, other specified: Secondary | ICD-10-CM | POA: Diagnosis not present

## 2015-03-17 DIAGNOSIS — S6391XA Sprain of unspecified part of right wrist and hand, initial encounter: Secondary | ICD-10-CM | POA: Diagnosis not present

## 2015-03-17 DIAGNOSIS — I10 Essential (primary) hypertension: Secondary | ICD-10-CM | POA: Diagnosis not present

## 2015-03-17 DIAGNOSIS — E119 Type 2 diabetes mellitus without complications: Secondary | ICD-10-CM | POA: Diagnosis not present

## 2015-03-17 DIAGNOSIS — W228XXA Striking against or struck by other objects, initial encounter: Secondary | ICD-10-CM | POA: Diagnosis not present

## 2015-03-17 MED ORDER — IBUPROFEN 800 MG PO TABS
800.0000 mg | ORAL_TABLET | Freq: Once | ORAL | Status: AC
  Administered 2015-03-17: 800 mg via ORAL
  Filled 2015-03-17: qty 1

## 2015-03-17 MED ORDER — MELOXICAM 15 MG PO TABS: 15.0000 mg | ORAL_TABLET | Freq: Every day | ORAL | Status: DC

## 2015-03-17 NOTE — Discharge Instructions (Signed)
Joint Sprain A sprain is a tear or stretch in the ligaments that hold a joint together. Severe sprains may need as long as 3-6 weeks of immobilization and/or exercises to heal completely. Sprained joints should be rested and protected. If not, they can become unstable and prone to re-injury. Proper treatment can reduce your pain, shorten the period of disability, and reduce the risk of repeated injuries. TREATMENT   Rest and elevate the injured joint to reduce pain and swelling.  Apply ice packs to the injury for 20-30 minutes every 2-3 hours for the next 2-3 days.  Keep the injury wrapped in a compression bandage or splint as long as the joint is painful or as instructed by your caregiver.  Do not use the injured joint until it is completely healed to prevent re-injury and chronic instability. Follow the instructions of your caregiver.  Long-term sprain management may require exercises and/or treatment by a physical therapist. Taping or special braces may help stabilize the joint until it is completely better. SEEK MEDICAL CARE IF:   You develop increased pain or swelling of the joint.  You develop increasing redness and warmth of the joint.  You develop a fever.  It becomes stiff.  Your hand or foot gets cold or numb. Document Released: 12/12/2004 Document Revised: 01/27/2012 Document Reviewed: 11/21/2008 [facility] Patient Information 2015 [place], [place]. This information is not intended to replace advice given to you by your health care provider. Make sure you discuss any questions you have with your health care provider.

## 2015-03-17 NOTE — Progress Notes (Signed)
Orthopedic Tech Progress Note Patient Details:  [name] 09-05-1961 [id number]  Ortho Devices Type of Ortho Device: Ace wrap, Velcro wrist splint Ortho Device/Splint Interventions: Application   Cammer, Theodoro Parma 03/17/2015, 7:01 PM

## 2015-03-17 NOTE — ED Provider Notes (Signed)
CSN: [id number]     Arrival date & time 03/17/15  1549 History  This chart was scribed for [name] Moras, PA-C, working with Wandra Arthurs, MD by Marti Sleigh, ED Scribe. This patient was seen in room [name] and the patient's care was started at 4:42 PM.    Chief Complaint  Patient presents with  . Wrist Pain    Patient is a 54 y.o. male presenting with wrist pain. The history is provided by the patient. No language interpreter was used.  Wrist Pain  HPI Comments: [name] is a 54 y.o. male who presents to the Emergency Department complaining of right hand pain with associated swelling for the last week. Pt states he hit his hand on some coffee mugs at work one week ago and has had pain and limited movement ever since. Pt states at the time he put ice on his  hand. Pt was seen by PCP for his right hand injury and his PCP ordered a right hand x-ray at [facility], but pt had reported to the ED due to worsening pain and. Pt states he is unable to bend his fingers due to pain. Pt states he has taken ibuprofen with some relief of his pain.   Past Medical History  Diagnosis Date  . Diabetes mellitus without complication   . Hypertension    History reviewed. No pertinent past surgical history. Family History  Problem Relation Age of Onset  . Diabetes Mother   . Diabetes Father    History  Substance Use Topics  . Smoking status: Never Smoker   . Smokeless tobacco: Not on file  . Alcohol Use: No    Review of Systems  Constitutional: Negative for fever and chills.  Musculoskeletal: Positive for joint swelling and arthralgias.  Skin: Negative for color change and wound.  Neurological: Positive for weakness (Secondary to pain). Negative for numbness.    Allergies  Review of patient's allergies indicates no known allergies.  Home Medications   Prior to Admission medications   Medication Sig Start Date End Date Taking? Authorizing Provider  atenolol (TENORMIN) 50 MG  tablet Take 1 tablet (50 mg total) by mouth daily. 10/13/13   Reyne Dumas, MD  diclofenac (VOLTAREN) 75 MG EC tablet Take 1 tablet (75 mg total) by mouth 2 (two) times daily. 10/13/13   Reyne Dumas, MD  insulin aspart protamine- aspart (NOVOLOG MIX 70/30) (70-30) 100 UNIT/ML injection Inject 0.4 mLs (40 Units total) into the skin 2 (two) times daily with a meal. 10/13/13   Reyne Dumas, MD  meloxicam (MOBIC) 15 MG tablet Take 1 tablet (15 mg total) by mouth daily. 10/13/13   Reyne Dumas, MD  metFORMIN (GLUCOPHAGE) 500 MG tablet Take 1 tablet (500 mg total) by mouth 2 (two) times daily with a meal. 10/13/13   Reyne Dumas, MD  oxyCODONE-acetaminophen (PERCOCET/ROXICET) 5-325 MG per tablet Take 1-2 tablets by mouth every 4 (four) hours as needed for pain. 07/15/13   Virgel Manifold, MD  simvastatin (ZOCOR) 20 MG tablet Take 1 tablet (20 mg total) by mouth every evening. 10/13/13   Reyne Dumas, MD  traMADol (ULTRAM) 50 MG tablet Take 1 tablet (50 mg total) by mouth every 6 (six) hours as needed. 10/13/13   Reyne Dumas, MD  triamterene-hydrochlorothiazide (MAXZIDE-25) 37.5-25 MG per tablet Take 1 tablet by mouth daily. 10/13/13   Reyne Dumas, MD   BP 137/82 mmHg  Pulse 85  Temp(Src) 99.1 F (37.3 C) (Oral)  Resp 18  SpO2  99% Physical Exam  Constitutional: He is oriented to person, place, and time. He appears well-developed and well-nourished. No distress.  HENT:  Head: Normocephalic and atraumatic.  Eyes: Pupils are equal, round, and reactive to light.  Neck: Neck supple.  Cardiovascular: Normal rate.   Pulmonary/Chest: Effort normal. No respiratory distress.  Musculoskeletal: Normal range of motion.  Right eolbow and shoulder intact. Right radial pulse intact. Decreased right wrist flexion and extension due to pain, normal supination and pronation. Hand is moderately tender throughout. No bruising noted. Tenderness noted to right anatomical snuff box. Cap refill intact.   Neurological: He  is alert and oriented to person, place, and time. Coordination normal.  Skin: Skin is warm and dry. He is not diaphoretic.  Psychiatric: He has a normal mood and affect. His behavior is normal.  Nursing note and vitals reviewed.   ED Course  Procedures  DIAGNOSTIC STUDIES: Oxygen Saturation is 99% on RA, normal by my interpretation.    COORDINATION OF CARE: 4:48 PM Discussed treatment plan with pt at bedside and pt agreed to plan.  6:40 PM X-ray of right wrist and right hand shows no acute abnormalities. Suspects hand/wrist sprain from the injury. Rice therapy discussed. Wrist brace provide for support.  Labs Review Labs Reviewed - No data to display  Imaging Review Dg Wrist Complete Right  03/17/2015   CLINICAL DATA:  Initial encounter for trauma 1 week ago. Pain centered at wrist and up to second metacarpal/MCP joint.  EXAM: RIGHT WRIST - COMPLETE 3+ VIEW  COMPARISON:  Hand films of same date and hand films of 05/21/2010  FINDINGS: No acute fracture or dislocation. Scaphoid intact. Mild degenerate changes involve the radial aspect of the carpus with joint space narrowing and subchondral sclerosis.  IMPRESSION: No acute osseous abnormality.   Electronically Signed   By: Abigail Miyamoto M.D.   On: 03/17/2015 18:21   Dg Hand Complete Right  03/17/2015   CLINICAL DATA:  Initial encounter for trauma to right hand/wrist 1 week ago with pain centered about the wrist and second digit.  EXAM: RIGHT HAND - COMPLETE 3+ VIEW  COMPARISON:  Wrist films same date in the hand films of 05/21/2010  FINDINGS: Overlap of fingers on the lateral view. No acute fracture or dislocation. Degenerate changes involve metacarpal phalangeal joints, most marked at the third.  IMPRESSION: No acute osseous abnormality.   Electronically Signed   By: Abigail Miyamoto M.D.   On: 03/17/2015 18:24     EKG Interpretation None      MDM   Final diagnoses:  Injury  Hand sprain, right, initial encounter    BP 128/80 mmHg   Pulse 66  Temp(Src) 98.8 F (37.1 C) (Oral)  Resp 16  SpO2 100%  I have reviewed nursing notes and vital signs. I personally reviewed the imaging tests through PACS system  I reviewed available ER/hospitalization records thought the EMR   I personally performed the services described in this documentation, which was scribed in my presence. The recorded information has been reviewed and is accurate.     [name] Moras, PA-C 03/17/15 Rewey Yao, MD 03/18/15 [phone]

## 2015-03-17 NOTE — ED Notes (Signed)
Pt reports right wrist swelling/pain post injury last week.

## 2015-10-17 ENCOUNTER — Inpatient Hospital Stay (HOSPITAL_COMMUNITY)
Admission: EM | Admit: 2015-10-17 | Discharge: 2015-10-19 | DRG: 558 | Disposition: A | Discharge status: Home / Self Care | Payer: Self-pay | Attending: Internal Medicine | Admitting: Internal Medicine

## 2015-10-17 ENCOUNTER — Emergency Department (HOSPITAL_COMMUNITY): Payer: Self-pay

## 2015-10-17 ENCOUNTER — Encounter (HOSPITAL_COMMUNITY): Payer: Self-pay | Admitting: Emergency Medicine

## 2015-10-17 DIAGNOSIS — E109 Type 1 diabetes mellitus without complications: Secondary | ICD-10-CM | POA: Diagnosis present

## 2015-10-17 DIAGNOSIS — M10021 Idiopathic gout, right elbow: Secondary | ICD-10-CM | POA: Diagnosis present

## 2015-10-17 DIAGNOSIS — E119 Type 2 diabetes mellitus without complications: Secondary | ICD-10-CM

## 2015-10-17 DIAGNOSIS — I10 Essential (primary) hypertension: Secondary | ICD-10-CM | POA: Diagnosis present

## 2015-10-17 DIAGNOSIS — Z7984 Long term (current) use of oral hypoglycemic drugs: Secondary | ICD-10-CM

## 2015-10-17 DIAGNOSIS — M109 Gout, unspecified: Secondary | ICD-10-CM | POA: Diagnosis present

## 2015-10-17 DIAGNOSIS — Z794 Long term (current) use of insulin: Secondary | ICD-10-CM

## 2015-10-17 DIAGNOSIS — E1149 Type 2 diabetes mellitus with other diabetic neurological complication: Secondary | ICD-10-CM

## 2015-10-17 DIAGNOSIS — M71121 Other infective bursitis, right elbow: Principal | ICD-10-CM | POA: Diagnosis present

## 2015-10-17 DIAGNOSIS — Z79899 Other long term (current) drug therapy: Secondary | ICD-10-CM

## 2015-10-17 DIAGNOSIS — E785 Hyperlipidemia, unspecified: Secondary | ICD-10-CM | POA: Diagnosis present

## 2015-10-17 HISTORY — DX: Type 2 diabetes mellitus without complications: E11.9

## 2015-10-17 HISTORY — DX: Hyperlipidemia, unspecified: E78.5

## 2015-10-17 HISTORY — DX: Personal history of other diseases of the musculoskeletal system and connective tissue: Z87.39

## 2015-10-17 LAB — CBC WITH DIFFERENTIAL/PLATELET
Basophils Absolute: 0 10*3/uL (ref 0.0–0.1)
Basophils Relative: 0 %
EOS PCT: 1 %
Eosinophils Absolute: 0.1 10*3/uL (ref 0.0–0.7)
HCT: 38.2 % — ABNORMAL LOW (ref 39.0–52.0)
Hemoglobin: 12.3 g/dL — ABNORMAL LOW (ref 13.0–17.0)
LYMPHS ABS: 1.8 10*3/uL (ref 0.7–4.0)
LYMPHS PCT: 17 %
MCH: 23.6 pg — AB (ref 26.0–34.0)
MCHC: 32.2 g/dL (ref 30.0–36.0)
MCV: 73.3 fL — AB (ref 78.0–100.0)
MONO ABS: 1 10*3/uL (ref 0.1–1.0)
MONOS PCT: 9 %
Neutro Abs: 7.9 10*3/uL — ABNORMAL HIGH (ref 1.7–7.7)
Neutrophils Relative %: 73 %
PLATELETS: 262 10*3/uL (ref 150–400)
RBC: 5.21 MIL/uL (ref 4.22–5.81)
RDW: 16.6 % — AB (ref 11.5–15.5)
WBC: 10.9 10*3/uL — ABNORMAL HIGH (ref 4.0–10.5)

## 2015-10-17 LAB — BASIC METABOLIC PANEL
Anion gap: 12 (ref 5–15)
BUN: 19 mg/dL (ref 6–20)
CO2: 24 mmol/L (ref 22–32)
CREATININE: 1.18 mg/dL (ref 0.61–1.24)
Calcium: 9.8 mg/dL (ref 8.9–10.3)
Chloride: 98 mmol/L — ABNORMAL LOW (ref 101–111)
GFR calc Af Amer: >60 mL/min (ref >60)
GFR calc non Af Amer: >60 mL/min (ref >60)
Glucose, Bld: 101 mg/dL — ABNORMAL HIGH (ref 65–99)
POTASSIUM: 4.2 mmol/L (ref 3.5–5.1)
Sodium: 134 mmol/L — ABNORMAL LOW (ref 135–145)

## 2015-10-17 LAB — CBG MONITORING, ED: Glucose-Capillary: 151 mg/dL — ABNORMAL HIGH (ref 65–99)

## 2015-10-17 MED ORDER — MORPHINE SULFATE (PF) 4 MG/ML IV SOLN
4.0000 mg | Freq: Once | INTRAVENOUS | Status: AC
  Administered 2015-10-17: 4 mg via INTRAVENOUS
  Filled 2015-10-17: qty 1

## 2015-10-17 MED ORDER — VANCOMYCIN HCL IN DEXTROSE 1-5 GM/200ML-% IV SOLN
1000.0000 mg | Freq: Once | INTRAVENOUS | Status: DC
  Filled 2015-10-17: qty 200

## 2015-10-17 MED ORDER — LIDOCAINE HCL (PF) 1 % IJ SOLN
5.0000 mL | Freq: Once | INTRAMUSCULAR | Status: AC
  Administered 2015-10-17: 5 mL
  Filled 2015-10-17: qty 5

## 2015-10-17 MED ORDER — ACETAMINOPHEN 160 MG/5ML PO SOLN
650.0000 mg | Freq: Once | ORAL | Status: AC
Start: 2015-10-17 — End: 2015-10-17
  Administered 2015-10-17: 650 mg via ORAL
  Filled 2015-10-17: qty 20.3

## 2015-10-17 MED ORDER — LIDOCAINE HCL 1 % IJ SOLN: 10.0000 mL | Freq: Once | INTRAMUSCULAR | Status: DC

## 2015-10-17 NOTE — ED Provider Notes (Signed)
MSE was initiated and I personally evaluated the patient and placed orders (if any) at  8:27 PM on October 17, 2015. S: [name] is a 54 y.o male with a hx of gout, DM and HTN who presents to the Emergency Department complaining of a gradually worsening 8/10 right hand pain and swelling onset 8 days ago. He reports associated swelling of the right elbow. He denies going to see a provider for his hand. He states that he has been using icy hot and wrapped his lower hand in a partial cast and ace wrap. He reports that he received the partial cast from [facility] from a previous injury that occurred a while ago. He adds that he has been taking Ibuprofen with no relief and his last dose was yesterday. 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 He denies any recent falls or injuries. Pt is right handed. He denies any fever, chills.  O: Filed Vitals:   10/17/15 1831  BP: 127/76  Pulse: 89  Temp: 100.8 F (38.2 C)  Resp: 18    Musculoskeletal: Partial cast and Ace bandage was removed. Significant swelling to the right hand and elbow. 2+ radial pulse. <2 second cap refill. Unable to flex and extend finger secondary to swelling.  A: Patient is febrile and diabetic. Will need further workup to rule out septic joint/bursitis/gout.  P: Basic labs were ordered. Tylenol was ordered.  The patient appears stable so that the remainder of the MSE may be completed by another provider.  Ottie Glazier, PA-C 10/17/15 2028  Ripley Fraise, MD 10/18/15 [phone]

## 2015-10-17 NOTE — ED Provider Notes (Signed)
CSN: [id number]     Arrival date & time 10/17/15  1807 History   First MD Initiated Contact with Patient 10/17/15 1913     Chief Complaint  Patient presents with  . Hand Pain   Patient gave verbal permission to utilize photo for medical documentation only The image was not stored on any personal device   Patient is a 54 y.o. male presenting with wrist pain. The history is provided by the patient.  Wrist Pain This is a new problem. The current episode started more than 2 days ago. The problem occurs daily. The problem has been gradually worsening. The symptoms are aggravated by bending. The symptoms are relieved by rest.  Patient reports he has had right elbow/forearm/wrist pain/swelling for past week.  He denies trauma He reports it is worsening It is worsened with movement of his arm, and improved with rest He does not recall having fever He denies vomiting.   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 Diabetes mellitus without complication ([place])   . Hypertension    History reviewed. No pertinent past surgical history. Family History  Problem Relation Age of Onset  . Diabetes Mother   . Diabetes Father    Social History  Substance Use Topics  . Smoking status: Never Smoker   . Smokeless tobacco: None  . Alcohol Use: No    Review of Systems  Gastrointestinal: Negative for vomiting.  Musculoskeletal: Positive for joint swelling and arthralgias.  All other systems reviewed and are negative.     Allergies  Review of patient's allergies indicates no known allergies.  Home Medications   Prior to Admission medications   Medication Sig Start Date End Date Taking? Authorizing Provider  atenolol (TENORMIN) 50 MG tablet Take 1 tablet (50 mg total) by mouth daily. 10/13/13   Reyne Dumas, MD  diclofenac (VOLTAREN) 75 MG EC tablet Take 1 tablet (75 mg total) by mouth 2 (two) times daily. 10/13/13   Reyne Dumas, MD  insulin  aspart protamine- aspart (NOVOLOG MIX 70/30) (70-30) 100 UNIT/ML injection Inject 0.4 mLs (40 Units total) into the skin 2 (two) times daily with a meal. 10/13/13   Reyne Dumas, MD  meloxicam (MOBIC) 15 MG tablet Take 1 tablet (15 mg total) by mouth daily. 03/17/15   Domenic Moras, PA-C  metFORMIN (GLUCOPHAGE) 500 MG tablet Take 1 tablet (500 mg total) by mouth 2 (two) times daily with a meal. 10/13/13   Reyne Dumas, MD  oxyCODONE-acetaminophen (PERCOCET/ROXICET) 5-325 MG per tablet Take 1-2 tablets by mouth every 4 (four) hours as needed for pain. 07/15/13   Virgel Manifold, MD  simvastatin (ZOCOR) 20 MG tablet Take 1 tablet (20 mg total) by mouth every evening. 10/13/13   Reyne Dumas, MD  traMADol (ULTRAM) 50 MG tablet Take 1 tablet (50 mg total) by mouth every 6 (six) hours as needed. 10/13/13   Reyne Dumas, MD  triamterene-hydrochlorothiazide (MAXZIDE-25) 37.5-25 MG per tablet Take 1 tablet by mouth daily. 10/13/13   Reyne Dumas, MD   BP 110/76 mmHg  Pulse 66  Temp(Src) 100.8 F (38.2 C) (Oral)  Resp 18  Wt 108.665 kg  SpO2 95% Physical Exam CONSTITUTIONAL: Well developed/well nourished HEAD: Normocephalic/atraumatic EYES: EOMI ENMT: Mucous membranes moist NECK: supple no meningeal signs SPINE/BACK:entire spine nontender CV: S1/S2 noted, no murmurs/rubs/gallops noted LUNGS: Lungs are clear to auscultation bilaterally, no apparent distress ABDOMEN: soft, nontender, no rebound or guarding, bowel sounds noted  throughout abdomen NEURO: Pt is awake/alert/appropriate, moves all extremitiesx4.  No facial droop.   EXTREMITIES: pulses normal/equal, ROM is limited in both right elbow/wrist.  Diffuse soft tissue swelling to right elbow/forearm/wrist/hand.  No crepitus noted.  No abscess noted.  Olecranon bursitis noted See photo:  SKIN: warm, color normal PSYCH: no abnormalities of mood noted, alert and oriented to situation  ED Course  .Joint Aspiration/Arthrocentesis Date/Time: 10/17/2015  11:57 PM Performed by: Ripley Fraise Authorized by: Ripley Fraise Consent: Verbal consent obtained. Written consent obtained. Risks and benefits: risks, benefits and alternatives were discussed Consent given by: patient Patient identity confirmed: verbally with patient and provided demographic data Time out: Immediately prior to procedure a "time out" was called to verify the correct patient, procedure, equipment, support staff and site/side marked as required. Indications: joint swelling,  pain and possible septic joint  Body area: elbow Joint: right elbow Local anesthesia used: yes Anesthesia: local infiltration Local anesthetic: lidocaine 1% without epinephrine Anesthetic total: 5 ml Patient sedated: no Preparation: Patient was prepped and draped in the usual sterile fashion. Needle gauge: 18 G Aspirate: cloudy and foul-smelling Patient tolerance: Patient tolerated the procedure well with no immediate complications    [id number] AM Pt with fever/leukocytosis.  He has obvious olecranon bursitis on exam and diffuse edema on his arm Concern for septic joint Cultures ordered Consult ortho 12:20 AM D/w dr Berenice Primas with ortho Recommend monitoring and admit to medicine and he will see in hospital 12:31 AM D/w dr gardner, will admit to triad service, med-surg  Labs Review Labs Reviewed  CBC WITH DIFFERENTIAL/PLATELET - Abnormal; Notable for the following:    WBC 10.9 (*)    Hemoglobin 12.3 (*)    HCT 38.2 (*)    MCV 73.3 (*)    MCH 23.6 (*)    RDW 16.6 (*)    Neutro Abs 7.9 (*)    All other components within normal limits  BASIC METABOLIC PANEL - Abnormal; Notable for the following:    Sodium 134 (*)    Chloride 98 (*)    Glucose, Bld 101 (*)    All other components within normal limits  CBG MONITORING, ED - Abnormal; Notable for the following:    Glucose-Capillary 151 (*)    All other components within normal limits  BODY FLUID CULTURE  GRAM STAIN  CULTURE, BLOOD  (ROUTINE X 2)  CULTURE, BLOOD (ROUTINE X 2)  SYNOVIAL CELL COUNT + DIFF, W/ CRYSTALS    Imaging Review Dg Elbow Complete Right  10/17/2015  CLINICAL DATA:  54 year old male with right hand injury. Patient reports right elbow tenderness. EXAM: RIGHT ELBOW - COMPLETE 3+ VIEW COMPARISON:  None. FINDINGS: There is no evidence of fracture, dislocation, or joint effusion. A 5 mm bony spur noted from the olecranon. There is mild soft tissue thickening of the posterior elbow and proximal forearm. Clinical correlation is recommended to evaluate for olecranon bursitis. IMPRESSION: No acute fracture or dislocation. Mild soft tissues swelling over the olecranon. Clinical correlation is recommended to evaluate for olecranon bursitis. Electronically Signed   By: Anner Crete M.D.   On: 10/17/2015 20:40   Dg Wrist Complete Right  10/17/2015  CLINICAL DATA:  Injury last week EXAM: RIGHT WRIST - COMPLETE 3+ VIEW COMPARISON:  03/17/2015 FINDINGS: No acute fracture. No dislocation. Soft tissue swelling about the wrist is noted. IMPRESSION: No acute bony pathology. Electronically Signed   By: Marybelle Killings M.D.   On: 10/17/2015 20:33   Dg Hand Complete Right  10/17/2015  CLINICAL DATA:  Right handpain and swelling after recent trauma, pain is worse EXAM: RIGHT HAND - COMPLETE 3+ VIEW COMPARISON:  None. FINDINGS: Mild arthritic change third and fourth metacarpophalangeal joints. Limited evaluation of the interphalangeal joints due to persistent flexion of the patient's fingers. No significant degenerative change, fracture, or dislocation appreciated involving these. Capitate and hamate both show several small cystic areas. These appear similar to the prior study. There is soft tissue swelling involving the hand diffusely. IMPRESSION: Nonspecific soft tissue swelling with mild degenerative change. No acute findings and no significant change from 03/17/2015 except for soft tissue swelling. Electronically Signed   By:  Skipper Cliche M.D.   On: 10/17/2015 20:33   I have personally reviewed and evaluated these images and lab results as part of my medical decision-making.   Medications  lidocaine (PF) (XYLOCAINE) 1 % injection 5 mL (not administered)  vancomycin (VANCOCIN) IVPB 1000 mg/200 mL premix (not administered)  morphine 4 MG/ML injection 4 mg (not administered)  acetaminophen (TYLENOL) solution 650 mg (650 mg Oral Given 10/17/15 2109)  morphine 4 MG/ML injection 4 mg (4 mg Intravenous Given 10/17/15 2239)    MDM   Final diagnoses:  Septic olecranon bursitis of right elbow    Nursing notes including past medical history and social history reviewed and considered in documentation Labs/vital reviewed myself and considered during evaluation xrays/imaging reviewed by myself and considered during evaluation     Ripley Fraise, MD 10/18/15 0031

## 2015-10-17 NOTE — ED Notes (Addendum)
Pt injured his right hand last week and was seen at [facility] long and had splint applied last Monday. Pt states swelling in worse and pain is not improving. Pt also c/o of a tender area on right elbow. Elbow is warm to touch. Pt also reports hx of gout and feels like he has that in both lower legs.

## 2015-10-18 ENCOUNTER — Encounter (HOSPITAL_COMMUNITY): Payer: Self-pay | Admitting: General Practice

## 2015-10-18 DIAGNOSIS — M109 Gout, unspecified: Secondary | ICD-10-CM | POA: Diagnosis present

## 2015-10-18 DIAGNOSIS — Z794 Long term (current) use of insulin: Secondary | ICD-10-CM

## 2015-10-18 DIAGNOSIS — E1149 Type 2 diabetes mellitus with other diabetic neurological complication: Secondary | ICD-10-CM

## 2015-10-18 DIAGNOSIS — E119 Type 2 diabetes mellitus without complications: Secondary | ICD-10-CM

## 2015-10-18 DIAGNOSIS — M71121 Other infective bursitis, right elbow: Principal | ICD-10-CM

## 2015-10-18 HISTORY — DX: Gout, unspecified: M10.9

## 2015-10-18 LAB — SYNOVIAL CELL COUNT + DIFF, W/ CRYSTALS
EOSINOPHILS-SYNOVIAL: 1 % (ref 0–1)
Lymphocytes-Synovial Fld: 25 % — ABNORMAL HIGH (ref 0–20)
Monocyte-Macrophage-Synovial Fluid: 28 % — ABNORMAL LOW (ref 50–90)
Neutrophil, Synovial: 46 % — ABNORMAL HIGH (ref 0–25)
WBC, SYNOVIAL: 2260 /mm3 — AB (ref 0–200)

## 2015-10-18 LAB — GLUCOSE, CAPILLARY
GLUCOSE-CAPILLARY: 57 mg/dL — AB (ref 65–99)
GLUCOSE-CAPILLARY: 119 mg/dL — AB (ref 65–99)
GLUCOSE-CAPILLARY: 91 mg/dL (ref 65–99)
GLUCOSE-CAPILLARY: 155 mg/dL — AB (ref 65–99)
GLUCOSE-CAPILLARY: 124 mg/dL — AB (ref 65–99)
Glucose-Capillary: 170 mg/dL — ABNORMAL HIGH (ref 65–99)

## 2015-10-18 MED ORDER — SIMVASTATIN 20 MG PO TABS
20.0000 mg | ORAL_TABLET | Freq: Every evening | ORAL | Status: DC
  Administered 2015-10-18: 20 mg via ORAL
  Filled 2015-10-18: qty 1

## 2015-10-18 MED ORDER — MELOXICAM 15 MG PO TABS
15.0000 mg | ORAL_TABLET | Freq: Every day | ORAL | Status: DC
  Administered 2015-10-18 – 2015-10-19 (×2): 15 mg via ORAL
  Filled 2015-10-18: qty 1
  Filled 2015-10-18 (×2): qty 2
  Filled 2015-10-18: qty 1

## 2015-10-18 MED ORDER — DICLOFENAC SODIUM 75 MG PO TBEC
75.0000 mg | DELAYED_RELEASE_TABLET | Freq: Two times a day (BID) | ORAL | Status: DC
  Administered 2015-10-18: 75 mg via ORAL
  Filled 2015-10-18 (×2): qty 1

## 2015-10-18 MED ORDER — OXYCODONE-ACETAMINOPHEN 5-325 MG PO TABS
1.0000 | ORAL_TABLET | ORAL | Status: DC | PRN
  Administered 2015-10-18 (×2): 2 via ORAL
  Filled 2015-10-18 (×2): qty 2

## 2015-10-18 MED ORDER — MORPHINE SULFATE (PF) 4 MG/ML IV SOLN
4.0000 mg | Freq: Once | INTRAVENOUS | Status: AC
  Administered 2015-10-18: 4 mg via INTRAVENOUS
  Filled 2015-10-18: qty 1

## 2015-10-18 MED ORDER — INSULIN ASPART 100 UNIT/ML ~~LOC~~ SOLN
0.0000 [IU] | SUBCUTANEOUS | Status: DC
  Administered 2015-10-18 (×2): 3 [IU] via SUBCUTANEOUS
  Administered 2015-10-18: 2 [IU] via SUBCUTANEOUS
  Administered 2015-10-18: 3 [IU] via SUBCUTANEOUS
  Administered 2015-10-19 (×2): 2 [IU] via SUBCUTANEOUS
  Filled 2015-10-18: qty 1

## 2015-10-18 MED ORDER — ATENOLOL 50 MG PO TABS
50.0000 mg | ORAL_TABLET | Freq: Every day | ORAL | Status: DC
  Administered 2015-10-18 – 2015-10-19 (×2): 50 mg via ORAL
  Filled 2015-10-18 (×2): qty 1

## 2015-10-18 MED ORDER — TRIAMTERENE-HCTZ 37.5-25 MG PO TABS: 1.0000 | ORAL_TABLET | Freq: Every day | ORAL | Status: DC

## 2015-10-18 MED ORDER — PREDNISONE 50 MG PO TABS
60.0000 mg | ORAL_TABLET | Freq: Every day | ORAL | Status: DC
  Administered 2015-10-19: 60 mg via ORAL
  Filled 2015-10-18: qty 1

## 2015-10-18 MED ORDER — VANCOMYCIN HCL IN DEXTROSE 750-5 MG/150ML-% IV SOLN
750.0000 mg | Freq: Two times a day (BID) | INTRAVENOUS | Status: DC
  Administered 2015-10-18 – 2015-10-19 (×2): 750 mg via INTRAVENOUS
  Filled 2015-10-18 (×3): qty 150

## 2015-10-18 MED ORDER — DEXTROSE 50 % IV SOLN
INTRAVENOUS | Status: AC
  Filled 2015-10-18: qty 50

## 2015-10-18 MED ORDER — INFLUENZA VAC SPLIT QUAD 0.5 ML IM SUSY
0.5000 mL | PREFILLED_SYRINGE | INTRAMUSCULAR | Status: AC
  Administered 2015-10-19: 0.5 mL via INTRAMUSCULAR
  Filled 2015-10-18: qty 0.5

## 2015-10-18 MED ORDER — VANCOMYCIN HCL 10 G IV SOLR
2000.0000 mg | INTRAVENOUS | Status: AC
  Administered 2015-10-18: 2000 mg via INTRAVENOUS
  Filled 2015-10-18: qty 2000

## 2015-10-18 MED ORDER — DEXTROSE 50 % IV SOLN
25.0000 mL | Freq: Once | INTRAVENOUS | Status: AC
  Administered 2015-10-18: 25 mL via INTRAVENOUS

## 2015-10-18 MED ORDER — SODIUM CHLORIDE 0.9 % IV SOLN
INTRAVENOUS | Status: DC
Start: 2015-10-18 — End: 2015-10-19
  Administered 2015-10-18: 01:00:00 via INTRAVENOUS
  Administered 2015-10-18: 1000 mL via INTRAVENOUS
  Administered 2015-10-18 – 2015-10-19 (×2): via INTRAVENOUS

## 2015-10-18 MED ORDER — INSULIN GLARGINE 100 UNIT/ML ~~LOC~~ SOLN
20.0000 [IU] | Freq: Every day | SUBCUTANEOUS | Status: DC
  Administered 2015-10-18 – 2015-10-19 (×2): 20 [IU] via SUBCUTANEOUS
  Filled 2015-10-18 (×2): qty 0.2

## 2015-10-18 NOTE — Progress Notes (Signed)
Patient admitted after midnight for bursitis-- please see H&P for full documentation. Seen by ortho-- plan for IV abx until culture back and then d/c on PO abx-- follow up with ortho office in 7-10 days Non-operative [name] Bear DO

## 2015-10-18 NOTE — Progress Notes (Signed)
The patient's right upper extremity is still moderately painful and swollen. He is on IV antibiotics. It is moderately improved. His right elbow is still tender and swollen around the olecranon. Minimal redness noted. We will plan on seeing him in the office in approximately 7 days as an outpatient. I explained this to the patient. This is a nonoperative problem at this point. Aspiration of the right elbow showed monosodium urate crystals. This could be consistent with gout which is fairly common in the olecranon region. Unless there is a medical reason not to do this would probably put him on a six-day 10 mg prednisone Dosepak. I would still cover him with oral antibiotics. We will plan on seeing him in one week in the office.

## 2015-10-18 NOTE — Progress Notes (Signed)
ANTIBIOTIC CONSULT NOTE - INITIAL  Pharmacy Consult for Vancomycin Indication: septic bursitis  No Known Allergies  Patient Measurements: Height: 5\' 6"  (167.6 cm) Weight: 239 lb 9 oz (108.665 kg) IBW/kg (Calculated) : 63.8  Vital Signs: Temp: 100.8 F (38.2 C) (11/29 1831) Temp Source: Oral (11/29 1831) BP: 110/76 mmHg (11/29 2215) Pulse Rate: 66 (11/29 2330) Intake/Output from previous day:   Intake/Output from this shift:    Labs:  Recent Labs  10/17/15 2033  WBC 10.9*  HGB 12.3*  PLT 262  CREATININE 1.18   Estimated Creatinine Clearance: 80.9 mL/min (by C-G formula based on Cr of 1.18). No results for input(s): VANCOTROUGH, VANCOPEAK, VANCORANDOM, GENTTROUGH, GENTPEAK, GENTRANDOM, TOBRATROUGH, TOBRAPEAK, TOBRARND, AMIKACINPEAK, AMIKACINTROU, AMIKACIN in the last 72 hours.   Microbiology: No results found for this or any previous visit (from the past 720 hour(s)).  Medical History: Past Medical History  Diagnosis Date  . Diabetes mellitus without complication ([place])   . Hypertension     Medications:  Awaiting med rec  Assessment: 54 y.o. M presents with R hand pain and swelling. To begin vancomycin for septic bursitis. Est normalized CrCl 70-75 ml/min. WBC elevated to 10.9. Tm 100.8.  Goal of Therapy:  Vancomycin trough level 10-15 mcg/ml  Plan:  Vancomycin 2000mg  IV now then 750mg  IV q12h F/u micro data, renal function, and pt's clinical condition Will f/u trough at [facility]  Sherlon Handing, PharmD, BCPS Clinical pharmacist, pager [phone] 10/18/2015,12:39 AM

## 2015-10-18 NOTE — Consult Note (Signed)
Reason for Consult:right elbow pain and swelling Referring Physician: hospitalists  [name] is an 54 y.o. male.  HPI: the patient is a 55 shared all male with a several day history of right elbow pain.  He's been wearing a wrist splint earlier.  Because of continued points of elbow pain he presented the emergency room.  Emergency room physician felt that he had a septic olecranon bursitis and aspiration was performed.  Thick material was removed from the elbow of about 2 cc.the patient is admitted to the medical service and we are consult for evaluation and management of bursitis.  Past Medical History  Diagnosis Date  . Hypertension   . Type II diabetes mellitus ([place])   . Hyperlipidemia   . Hx of gout   . Septic arthritis of elbow, right ([place]) 10/17/2015    Past Surgical History  Procedure Laterality Date  . No past surgeries      Family History  Problem Relation Age of Onset  . Diabetes Mother   . Diabetes Father     Social History:  reports that he has never smoked. He has never used smokeless tobacco. He reports that he does not drink alcohol or use illicit drugs.  Allergies: No Known Allergies  Medications: I have reviewed the patient's current medications.  Results for orders placed or performed during the hospital encounter of 10/17/15 (from the past 48 hour(s))  CBG monitoring, ED     Status: Abnormal   Collection Time: 10/17/15  6:33 PM  Result Value Ref Range   Glucose-Capillary 151 (H) 65 - 99 mg/dL  CBC with Differential     Status: Abnormal   Collection Time: 10/17/15  8:33 PM  Result Value Ref Range   WBC 10.9 (H) 4.0 - 10.5 K/uL   RBC 5.21 4.22 - 5.81 MIL/uL   Hemoglobin 12.3 (L) 13.0 - 17.0 g/dL   HCT 38.2 (L) 39.0 - 52.0 %   MCV 73.3 (L) 78.0 - 100.0 fL   MCH 23.6 (L) 26.0 - 34.0 pg   MCHC 32.2 30.0 - 36.0 g/dL   RDW 16.6 (H) 11.5 - 15.5 %   Platelets 262 150 - 400 K/uL   Neutrophils Relative % 73 %   Neutro Abs 7.9 (H) 1.7 - 7.7 K/uL   Lymphocytes Relative 17 %   Lymphs Abs 1.8 0.7 - 4.0 K/uL   Monocytes Relative 9 %   Monocytes Absolute 1.0 0.1 - 1.0 K/uL   Eosinophils Relative 1 %   Eosinophils Absolute 0.1 0.0 - 0.7 K/uL   Basophils Relative 0 %   Basophils Absolute 0.0 0.0 - 0.1 K/uL  Basic metabolic panel     Status: Abnormal   Collection Time: 10/17/15  8:33 PM  Result Value Ref Range   Sodium 134 (L) 135 - 145 mmol/L   Potassium 4.2 3.5 - 5.1 mmol/L   Chloride 98 (L) 101 - 111 mmol/L   CO2 24 22 - 32 mmol/L   Glucose, Bld 101 (H) 65 - 99 mg/dL   BUN 19 6 - 20 mg/dL   Creatinine, Ser 1.18 0.61 - 1.24 mg/dL   Calcium 9.8 8.9 - 10.3 mg/dL   GFR calc non Af Amer >60 >60 mL/min   GFR calc Af Amer >60 >60 mL/min    Comment: (NOTE) The eGFR has been calculated using the CKD EPI equation. This calculation has not been validated in all clinical situations. eGFR's persistently <60 mL/min signify possible Chronic Kidney Disease.  Anion gap 12 5 - 15  Synovial cell count + diff, w/ crystals     Status: Abnormal   Collection Time: 10/17/15 11:50 PM  Result Value Ref Range   Color, Synovial STRAW (A) YELLOW   Appearance-Synovial TURBID (A) CLEAR   Crystals, Fluid INTRACELLULAR MONOSODIUM URATE CRYSTALS     Comment: EXTRACELLULAR MONOSODIUM URATE CRYSTALS   WBC, Synovial 2260 (H) 0 - 200 /cu mm   Neutrophil, Synovial 46 (H) 0 - 25 %   Lymphocytes-Synovial Fld 25 (H) 0 - 20 %   Monocyte-Macrophage-Synovial Fluid 28 (L) 50 - 90 %   Eosinophils-Synovial 1 0 - 1 %  Body fluid culture     Status: None (Preliminary result)   Collection Time: 10/17/15 11:50 PM  Result Value Ref Range   Specimen Description SYNOVIAL FLUID    Special Requests  LEFT ELBOW    Gram Stain      MODERATE WBC PRESENT,BOTH PMN AND MONONUCLEAR NO ORGANISMS SEEN    Culture PENDING    Report Status PENDING     Dg Elbow Complete Right  10/17/2015  CLINICAL DATA:  53 year old male with right hand injury. Patient reports right elbow  tenderness. EXAM: RIGHT ELBOW - COMPLETE 3+ VIEW COMPARISON:  None. FINDINGS: There is no evidence of fracture, dislocation, or joint effusion. A 5 mm bony spur noted from the olecranon. There is mild soft tissue thickening of the posterior elbow and proximal forearm. Clinical correlation is recommended to evaluate for olecranon bursitis. IMPRESSION: No acute fracture or dislocation. Mild soft tissues swelling over the olecranon. Clinical correlation is recommended to evaluate for olecranon bursitis. Electronically Signed   By: Anner Crete M.D.   On: 10/17/2015 20:40   Dg Wrist Complete Right  10/17/2015  CLINICAL DATA:  Injury last week EXAM: RIGHT WRIST - COMPLETE 3+ VIEW COMPARISON:  03/17/2015 FINDINGS: No acute fracture. No dislocation. Soft tissue swelling about the wrist is noted. IMPRESSION: No acute bony pathology. Electronically Signed   By: Marybelle Killings M.D.   On: 10/17/2015 20:33   Dg Hand Complete Right  10/17/2015  CLINICAL DATA:  Right handpain and swelling after recent trauma, pain is worse EXAM: RIGHT HAND - COMPLETE 3+ VIEW COMPARISON:  None. FINDINGS: Mild arthritic change third and fourth metacarpophalangeal joints. Limited evaluation of the interphalangeal joints due to persistent flexion of the patient's fingers. No significant degenerative change, fracture, or dislocation appreciated involving these. Capitate and hamate both show several small cystic areas. These appear similar to the prior study. There is soft tissue swelling involving the hand diffusely. IMPRESSION: Nonspecific soft tissue swelling with mild degenerative change. No acute findings and no significant change from 03/17/2015 except for soft tissue swelling. Electronically Signed   By: Skipper Cliche M.D.   On: 10/17/2015 20:33    ROS   ROS: I have reviewed the patient's review of systems thoroughly and there are no positive responses as relates to the HPI. Exam: Blood pressure 111/70, pulse 69, temperature  98.8 F (37.1 C), temperature source Oral, resp. rate 17, height '5\' 5"'  (1.651 m), weight 108.183 kg (238 lb 8 oz), SpO2 97 %. Physical Exam Well-developed well-nourished patient in no acute distress. Alert and oriented x3 HEENT:within normal limits Cardiac: Regular rate and rhythm Pulmonary: Lungs clear to auscultation Abdomen: Soft and nontender.  Normal active bowel sounds  Musculoskeletal: (right elbow: Mild turns palpation.  Ability to rotate and flex and extend the arm without severe pain.  He significant return to palpation around  the elbow an area of olecranon bursa. Assessment/Plan: 54 year old male admitted to the medicine service with concern for septic bursitis.  He is currently being treated with IV antibiotic therapy and has had an aspiration of the olecranon bursa in the emergency room.//I do not believe this is an operative case.  I believe the patient should have IV antibiotic therapy for a couple of days until his culture results are available.  Advised that point would be per internal medicine and/or infectious disease.  I will see him back in my office in 7-10 days and if he continues to have significant pain in this area we could consider an olecranon bursectomy but at this point I think this should be old be treated nonoperatively.  We will follow him while in the hospital.  Any questions please call Dr. Dorna Leitz at [phone].  Nyleah Mcginnis L 10/18/2015, 2:41 AM

## 2015-10-18 NOTE — Care Management Note (Signed)
Case Management Note  Patient Details  Name: [name] MRN: [id number] Date of Birth: 1961-06-18  Subjective/Objective:    Date: 10/18/15 Spoke with patient at the bedside.  Introduced self as Tourist information centre manager and explained role in discharge planning and how to be reached.  Verified patient lives in town, alone.  Expressed potential need for no other DME.  Verified patient anticipates to go to SNF at time of discharge.  Physical therapy will work with patient again tomorrow to see where he is with mobility and make final recs .  Patient confirmed needing help with their medication.  Patient  takes the bus  Or cab to MD appointments.  Verified patient has  No PCP he will follow up with [facility] and [facility].   Plan: CM will continue to follow for discharge planning and [facility] resources.                 Action/Plan:   Expected Discharge Date:                  Expected Discharge Plan:  Skilled Nursing Facility  In-House Referral:  Clinical Social Work  Discharge planning Services  CM Consult  Post Acute Care Choice:    Choice offered to:     DME Arranged:    DME Agency:     HH Arranged:    [place] Agency:     Status of Service:  In process, will continue to follow  Medicare Important Message Given:    Date Medicare IM Given:    Medicare IM give by:    Date Additional Medicare IM Given:    Additional Medicare Important Message give by:     If discussed at [place] of Stay Meetings, dates discussed:    Additional Comments:  Zenon Mayo, RN 10/18/2015, 3:54 PM

## 2015-10-18 NOTE — Progress Notes (Signed)
Nutrition Brief Note  Patient identified on the Malnutrition Screening Tool (MST) Report  Wt Readings from Last 15 Encounters:  10/18/15 238 lb 8 oz (108.183 kg)  10/13/13 265 lb 9.6 oz (120.475 kg)   [name] is a 54 y.o. male with h/o DM he states is type 1, bursitis of the R elbow in 2014 that grew out bacillus species but apparently was not operated on and apparently resolved with empiric levaquin treatment at that time.  Pt admitted with septic bursitis of rt elbow. He reports that his appetite was decreased 1 day PTA, due to "feeling sick". Appetite is fair at baseline and pt reveals he consumes 2 meals per day. He shares with this RD that he has made great efforts to better control his blood sugars (noted Hgb has been decreased from 8 to below 7 per chart review). He checks his CBGS at home and yields results in 110-120 range. He consumed about 75% of his breakfast this morning.   He reveals UBW is around 240#.   Nutrition-Focused physical exam completed. Findings are no fat depletion, no muscle depletion, and moderate edema.   Discussed importance of good PO intake to promote healing. Commended pt on his good compliance with self-management efforts.   Body mass index is 39.69 kg/(m^2). Patient meets criteria for obesity, class II based on current BMI.   Current diet order is Carb Modified, patient is consuming approximately 75% of meals at this time. Labs and medications reviewed.   No nutrition interventions warranted at this time. If nutrition issues arise, please consult RD.   Yaneth Fairbairn A. Jimmye Norman, RD, LDN, CDE Pager: [phone] After hours Pager: [phone]

## 2015-10-18 NOTE — Clinical Social Work Note (Signed)
Clinical Social Work Assessment  Patient Details  Name: [name] MRN: [id number] Date of Birth: 1961/08/19  Date of referral:  10/18/15               Reason for consult:  Facility Placement                Permission sought to share information with:  Facility Art therapist granted to share information::  No (awaiting re-evaluation by PT)  Name::        Agency::     Relationship::     Contact Information:     Housing/Transportation Living arrangements for the past 2 months:  Apartment Source of Information:  Patient Patient Interpreter Needed:  None Criminal Activity/Legal Involvement Pertinent to Current Situation/Hospitalization:  No - Comment as needed Significant Relationships:  Neighbor, Parents Lives with:  Self Do you feel safe going back to the place where you live?  Yes Need for family participation in patient care:  No (Coment)  Care giving concerns:  Pt lives alone with limited support from neighbors- pt is currently unable to ambulate due to arthritis and does not know how he would care for himself at home   Social Worker assessment / plan:  CSW spoke with pt concerning PT recommendation for SNF  Employment status:  [name] information:  Self Pay (Medicaid Pending) PT Recommendations:  [facility] / Referral to community resources:  [place]  Patient/Family's Response to care:  Patient is not sure what he wants to do at this time- pt expressed strong preference for returning home but he understands the concerns with his mobility.  Pt reports that when he has had flares of arthritis in the past he has remained bedbound until he recovers.  PT has planned to revisit pt tomorrow morning to reassess mobility- pt states he is very motivated to show improvement when working with them tomorrow and does not agree to look at SNF until he sees PT again.  Patient/Family's Understanding of and  Emotional Response to Diagnosis, Current Treatment, and Prognosis:  Pt seems to have good understanding of current condition- pt is very hopeful that he can return home and continue living independently after this hospitalization.  Emotional Assessment Appearance:  Appears stated age Attitude/Demeanor/Rapport:    Affect (typically observed):  Appropriate, Pleasant Orientation:  Oriented to Self, Oriented to Place, Oriented to  Time, Oriented to Situation Alcohol / Substance use:  Not Applicable Psych involvement (Current and /or in the community):  No (Comment)  Discharge Needs  Concerns to be addressed:  Financial / Insurance Concerns, Home Safety Concerns, Care Coordination Readmission within the last 30 days:  No Current discharge risk:  Physical Impairment Barriers to Discharge:  Continued Medical Work up   [employer], LCSW 10/18/2015, 4:02 PM

## 2015-10-18 NOTE — H&P (Signed)
[facility] History and Physical  [name] [id number] DOB: 1961-10-07 DOA: 10/17/2015  Referring physician: EDP PCP: Angelica Chessman, MD   Chief Complaint: Elbow and hand pain   HPI: [name] is a 54 y.o. male with h/o DM he states is type 1, bursitis of the R elbow in 2014 that grew out bacillus species but apparently was not operated on and apparently resolved with empiric levaquin treatment at that time.  Patient presents to the ED with c/o one week history of right elbow / wrist / forearm swelling and pain.  No trauma.  This has been worsening over the past week.  He was noted to be febrile to 100.8 in the ED with WBC of 10.9k.  EDP aspirated his olecranon bursa and got back foul smelling cloudy fluid which was sent off to the lab for culture.  Ortho will see patient in consult and hospitalist has been asked to admit.  Review of Systems: Systems reviewed.  As above, otherwise negative  Past Medical History  Diagnosis Date  . Hypertension   . Type II diabetes mellitus ([place])   . Hyperlipidemia   . Hx of gout    History reviewed. No pertinent past surgical history. Social History:  reports that he has never smoked. He does not have any smokeless tobacco history on file. He reports that he does not drink alcohol or use illicit drugs.  No Known Allergies  Family History  Problem Relation Age of Onset  . Diabetes Mother   . Diabetes Father      Prior to Admission medications   Medication Sig Start Date End Date Taking? Authorizing Provider  atenolol (TENORMIN) 50 MG tablet Take 1 tablet (50 mg total) by mouth daily. 10/13/13  Yes Reyne Dumas, MD  diclofenac (VOLTAREN) 75 MG EC tablet Take 1 tablet (75 mg total) by mouth 2 (two) times daily. 10/13/13  Yes Reyne Dumas, MD  ibuprofen (ADVIL,MOTRIN) 200 MG tablet Take 200 mg by mouth every 6 (six) hours as needed for moderate pain.   Yes Historical Provider, MD  insulin aspart protamine- aspart (NOVOLOG  MIX 70/30) (70-30) 100 UNIT/ML injection Inject 0.4 mLs (40 Units total) into the skin 2 (two) times daily with a meal. 10/13/13  Yes Reyne Dumas, MD  meloxicam (MOBIC) 15 MG tablet Take 1 tablet (15 mg total) by mouth daily. 03/17/15  Yes Domenic Moras, PA-C  metFORMIN (GLUCOPHAGE) 500 MG tablet Take 1 tablet (500 mg total) by mouth 2 (two) times daily with a meal. 10/13/13  Yes Reyne Dumas, MD  oxyCODONE-acetaminophen (PERCOCET/ROXICET) 5-325 MG per tablet Take 1-2 tablets by mouth every 4 (four) hours as needed for pain. 07/15/13  Yes Virgel Manifold, MD  simvastatin (ZOCOR) 20 MG tablet Take 1 tablet (20 mg total) by mouth every evening. 10/13/13  Yes Reyne Dumas, MD   Physical Exam: Filed Vitals:   10/17/15 2300 10/17/15 2330  BP:    Pulse: 72 66  Temp:    Resp:      BP 110/76 mmHg  Pulse 66  Temp(Src) 100.8 F (38.2 C) (Oral)  Resp 18  Ht 5\' 6"  (1.676 m)  Wt 108.665 kg (239 lb 9 oz)  BMI 38.68 kg/m2  SpO2 95%  General Appearance:    Alert, oriented, no distress, appears stated age  Head:    Normocephalic, atraumatic  Eyes:    PERRL, EOMI, sclera non-icteric        Nose:   Nares without drainage or epistaxis. Mucosa,  turbinates normal  Throat:   Moist mucous membranes. Oropharynx without erythema or exudate.  Neck:   Supple. No carotid bruits.  No thyromegaly.  No lymphadenopathy.   Back:     No CVA tenderness, no spinal tenderness  Lungs:     Clear to auscultation bilaterally, without wheezes, rhonchi or rales  Chest wall:    No tenderness to palpitation  Heart:    Regular rate and rhythm without murmurs, gallops, rubs  Abdomen:     Soft, non-tender, nondistended, normal bowel sounds, no organomegaly  Genitalia:    deferred  Rectal:    deferred  Extremities:   No clubbing, cyanosis or edema.  Pulses:   2+ and symmetric all extremities  Skin:   Skin color, texture, turgor normal, no rashes or lesions  Lymph nodes:   Cervical, supraclavicular, and axillary nodes normal   Neurologic:   CNII-XII intact. Normal strength, sensation and reflexes      throughout    Labs on Admission:  Basic Metabolic Panel:  Recent Labs Lab 10/17/15 2033  NA 134*  K 4.2  CL 98*  CO2 24  GLUCOSE 101*  BUN 19  CREATININE 1.18  CALCIUM 9.8   Liver Function Tests: No results for input(s): AST, ALT, ALKPHOS, BILITOT, PROT, ALBUMIN in the last 168 hours. No results for input(s): LIPASE, AMYLASE in the last 168 hours. No results for input(s): AMMONIA in the last 168 hours. CBC:  Recent Labs Lab 10/17/15 2033  WBC 10.9*  NEUTROABS 7.9*  HGB 12.3*  HCT 38.2*  MCV 73.3*  PLT 262   Cardiac Enzymes: No results for input(s): CKTOTAL, CKMB, CKMBINDEX, TROPONINI in the last 168 hours.  BNP (last 3 results) No results for input(s): PROBNP in the last 8760 hours. CBG:  Recent Labs Lab 10/17/15 1833  GLUCAP 151*    Radiological Exams on Admission: Dg Elbow Complete Right  10/17/2015  CLINICAL DATA:  55 year old male with right hand injury. Patient reports right elbow tenderness. EXAM: RIGHT ELBOW - COMPLETE 3+ VIEW COMPARISON:  None. FINDINGS: There is no evidence of fracture, dislocation, or joint effusion. A 5 mm bony spur noted from the olecranon. There is mild soft tissue thickening of the posterior elbow and proximal forearm. Clinical correlation is recommended to evaluate for olecranon bursitis. IMPRESSION: No acute fracture or dislocation. Mild soft tissues swelling over the olecranon. Clinical correlation is recommended to evaluate for olecranon bursitis. Electronically Signed   By: Anner Crete M.D.   On: 10/17/2015 20:40   Dg Wrist Complete Right  10/17/2015  CLINICAL DATA:  Injury last week EXAM: RIGHT WRIST - COMPLETE 3+ VIEW COMPARISON:  03/17/2015 FINDINGS: No acute fracture. No dislocation. Soft tissue swelling about the wrist is noted. IMPRESSION: No acute bony pathology. Electronically Signed   By: Marybelle Killings M.D.   On: 10/17/2015 20:33   Dg  Hand Complete Right  10/17/2015  CLINICAL DATA:  Right handpain and swelling after recent trauma, pain is worse EXAM: RIGHT HAND - COMPLETE 3+ VIEW COMPARISON:  None. FINDINGS: Mild arthritic change third and fourth metacarpophalangeal joints. Limited evaluation of the interphalangeal joints due to persistent flexion of the patient's fingers. No significant degenerative change, fracture, or dislocation appreciated involving these. Capitate and hamate both show several small cystic areas. These appear similar to the prior study. There is soft tissue swelling involving the hand diffusely. IMPRESSION: Nonspecific soft tissue swelling with mild degenerative change. No acute findings and no significant change from 03/17/2015 except for soft tissue  swelling. Electronically Signed   By: Skipper Cliche M.D.   On: 10/17/2015 20:33    EKG: Independently reviewed.  Assessment/Plan Principal Problem:   Septic bursitis of elbow Active Problems:   IDDM (insulin dependent diabetes mellitus) ([place])   1. Septic bursitis of elbow - 1. Empiric vanc 2. Cultures pending 3. NPO 4. SCDs only for dvt PPX 5. Ortho to eval 2. IDDM - 1. Hold home meds 2. lantus 20 units daily ordered 3. Q4H SSI ordered while NPO    Code Status: Full  Family Communication: No family in room Disposition Plan: Admit to inpatient   Time spent: 90 min  Maeci Kalbfleisch M. [facility] Pager [phone]  If 7AM-7PM, please contact the day team taking care of the patient [email or web address] Password [email or web address] 10/18/2015, 12:48 AM

## 2015-10-18 NOTE — Evaluation (Signed)
Physical Therapy Evaluation Patient Details Name: [name] MRN: [id number] DOB: October 10, 1961 Today's Date: 10/18/2015   History of Present Illness  Patient is a 54 y/o male presents with right elbow / wrist / forearm swelling and pain. In ED, pt febrile to 100.8 with WBC of 10.9k.EDP aspirated his olecranon bursa and got back foul smelling cloudy fluid which was sent off to the lab for culture. PMH of DM, bursitis of the R elbow in 2014 that grew out bacillus species but apparently was not operated on and resovled with empiric levaquin., HTN, HLD,gout.   Clinical Impression  Patient presents with pain in bil knees/elbow and weakness limiting safe mobility. Required Mod A to stand and to transfer to chair today. Limited by pain in knees.Might benefit from use of RW for support. Pt lives alone and does not have support at home. Would really benefit from ST SNF to maximize independence and mobility prior to return home.    Follow Up Recommendations SNF;Supervision/Assistance - 24 hour    Equipment Recommendations  Other (comment) (TBD, may need RW)    Recommendations for Other Services OT consult     Precautions / Restrictions Precautions Precautions: Fall Restrictions Weight Bearing Restrictions: No      Mobility  Bed Mobility Overal bed mobility: Needs Assistance Bed Mobility: Supine to Sit     Supine to sit: Supervision;HOB elevated     General bed mobility comments: Increased time and use of rails for support.   Transfers Overall transfer level: Needs assistance Equipment used: Straight cane Transfers: Sit to/from [facility] Sit to Stand: Mod assist Stand pivot transfers: Mod assist       General transfer comment: Mod A to boost from EOB with cues for hand placement/technique. Increased difficulty extending bil knees and obtaining upright posture. Requires BUE support - one on cane and one on therapist for handheld assist. Mod A SPT bed to chair  with difficulty placing weight through BLEs.   Ambulation/Gait                Stairs            Wheelchair Mobility    Modified Rankin (Stroke Patients Only)       Balance Overall balance assessment: Needs assistance Sitting-balance support: Feet supported;No upper extremity supported Sitting balance-Leahy Scale: Good     Standing balance support: During functional activity Standing balance-Leahy Scale: Poor Standing balance comment: relient on BUEs and Min-mod A for balance/support.                              Pertinent Vitals/Pain Pain Assessment: Faces Faces Pain Scale: Hurts even more Pain Location: right elbow, bil knees Pain Descriptors / Indicators: Sore;Aching Pain Intervention(s): Monitored during session;Repositioned    Home Living Family/patient expects to be discharged to:: Private residence Living Arrangements: Alone   Type of Home: Apartment       Home Layout: One level Home Equipment: Cane - single point      Prior Function Level of Independence: Independent with assistive device(s)         Comments: Pt reports using SPC as needed. Works for [employer] in the [employer]. Does not drive. Takes the bus everywhere.     Hand Dominance        Extremity/Trunk Assessment   Upper Extremity Assessment: Defer to OT evaluation           Lower Extremity Assessment:  (  reports arthritis in Bil knees.)         Communication   Communication: No difficulties  Cognition Arousal/Alertness: Awake/alert Behavior During Therapy: WFL for tasks assessed/performed Overall Cognitive Status: Within Functional Limits for tasks assessed (questionable safety awareness.)                      General Comments General comments (skin integrity, edema, etc.): Lengthy discussion with patient re: disposition, rehab, importance of mobility esp with arthritis.    Exercises        Assessment/Plan    PT Assessment  Patient needs continued PT services  PT Diagnosis Difficulty walking;Generalized weakness;Acute pain   PT Problem List Decreased strength;Pain;Decreased activity tolerance;Decreased balance;Decreased mobility;Decreased safety awareness  PT Treatment Interventions Balance training;Gait training;Therapeutic activities;Therapeutic exercise;Functional mobility training;Patient/family education   PT Goals (Current goals can be found in the Care Plan section) Acute Rehab PT Goals Patient Stated Goal: to go home, "i need to go back to work tomorrow." PT Goal Formulation: With patient Time For Goal Achievement: 11/01/15 Potential to Achieve Goals: Fair    Frequency Min 3X/week   Barriers to discharge Decreased caregiver support Pt lives alone    Co-evaluation               End of Session Equipment Utilized During Treatment: Gait belt Activity Tolerance: Patient limited by pain Patient left: in chair;with call bell/phone within reach Nurse Communication: Mobility status (tech notified of transfer technique back to bed)         Time: [id number] PT Time Calculation (min) (ACUTE ONLY): 23 min   Charges:   PT Evaluation $Initial PT Evaluation Tier I: 1 Procedure PT Treatments $Therapeutic Activity: 8-22 mins   PT G Codes:        Trimaine Maser A Merion Caton 10/18/2015, 2:15 PM Wray Kearns, [place], DPT [phone]

## 2015-10-19 DIAGNOSIS — E119 Type 2 diabetes mellitus without complications: Secondary | ICD-10-CM

## 2015-10-19 DIAGNOSIS — I10 Essential (primary) hypertension: Secondary | ICD-10-CM

## 2015-10-19 DIAGNOSIS — M109 Gout, unspecified: Secondary | ICD-10-CM

## 2015-10-19 DIAGNOSIS — Z794 Long term (current) use of insulin: Secondary | ICD-10-CM

## 2015-10-19 DIAGNOSIS — E785 Hyperlipidemia, unspecified: Secondary | ICD-10-CM

## 2015-10-19 LAB — GLUCOSE, CAPILLARY
GLUCOSE-CAPILLARY: 104 mg/dL — AB (ref 65–99)
GLUCOSE-CAPILLARY: 123 mg/dL — AB (ref 65–99)
GLUCOSE-CAPILLARY: 136 mg/dL — AB (ref 65–99)
GLUCOSE-CAPILLARY: 88 mg/dL (ref 65–99)
GLUCOSE-CAPILLARY: 90 mg/dL (ref 65–99)

## 2015-10-19 MED ORDER — COLCHICINE 0.6 MG PO TABS: 0.6000 mg | ORAL_TABLET | Freq: Two times a day (BID) | ORAL | Status: DC

## 2015-10-19 MED ORDER — PREDNISONE 10 MG PO TABS
ORAL_TABLET | ORAL | Status: DC
Start: 2015-10-19 — End: 2016-01-01

## 2015-10-19 MED ORDER — COLCHICINE 0.6 MG PO TABS: 0.6000 mg | ORAL_TABLET | Freq: Every day | ORAL | Status: DC

## 2015-10-19 MED ORDER — COLCHICINE 0.6 MG PO TABS
0.6000 mg | ORAL_TABLET | Freq: Every day | ORAL | Status: DC
  Administered 2015-10-19: 0.6 mg via ORAL
  Filled 2015-10-19: qty 1

## 2015-10-19 NOTE — NC FL2 (Signed)
[place] LEVEL OF CARE SCREENING TOOL     IDENTIFICATION  Patient Name: [name] Birthdate: 18-Jul-1961 Sex: male Admission Date (Current Location): 10/17/2015  [facility] and [place] Number: Herbalist and Address:  [facility], [place] [address] [place], [place] [address]      Provider Number: [id number]  Attending Physician Name and Address:  Jonetta Osgood, MD  Relative Name and Phone Number:       Current Level of Care: Hospital Recommended Level of Care: [place] Prior Approval Number:    Date Approved/Denied:   PASRR Number: [id number]  Discharge Plan: SNF    Current Diagnoses: Patient Active Problem List   Diagnosis Date Noted  . Septic bursitis of elbow 10/18/2015  . IDDM (insulin dependent diabetes mellitus) ([place]) 10/18/2015    Orientation ACTIVITIES/SOCIAL BLADDER RESPIRATION    Self, Time, Situation, Place    Continent Normal  BEHAVIORAL SYMPTOMS/MOOD NEUROLOGICAL BOWEL NUTRITION STATUS      Continent Diet (see DC summary)  PHYSICIAN VISITS COMMUNICATION OF NEEDS Height & Weight Skin    Verbally 5\' 5"  (165.1 cm) 238 lbs. Normal          AMBULATORY STATUS RESPIRATION    Assist extensive Normal      Personal Care Assistance Level of Assistance  Bathing, Dressing Bathing Assistance: Limited assistance   Dressing Assistance: Limited assistance      Functional Limitations Info                SPECIAL CARE FACTORS FREQUENCY  PT (By licensed PT)     PT Frequency: 5/wk             Additional Factors Info  Code Status, Allergies, Insulin Sliding Scale Code Status Info: FULL Allergies Info: NKA   Insulin Sliding Scale Info: 6/day       Current Medications (10/19/2015):  This is the current hospital active medication list Current Facility-Administered Medications  Medication Dose Route Frequency Provider Last Rate Last Dose  . 0.9 %  sodium chloride infusion    Intravenous Continuous Etta Quill, DO 125 mL/hr at 10/19/15 [id number]    . atenolol (TENORMIN) tablet 50 mg  50 mg Oral Daily Etta Quill, DO   50 mg at 10/18/15 1005  . Influenza vac split quadrivalent PF (FLUARIX) injection 0.5 mL  0.5 mL Intramuscular Tomorrow-1000 Jared M Gardner, DO      . insulin aspart (novoLOG) injection 0-15 Units  0-15 Units Subcutaneous 6 times per day Etta Quill, DO   2 Units at 10/19/15 0458  . insulin glargine (LANTUS) injection 20 Units  20 Units Subcutaneous Daily Etta Quill, DO   20 Units at 10/18/15 1006  . meloxicam (MOBIC) tablet 15 mg  15 mg Oral Daily Etta Quill, DO   15 mg at 10/18/15 1005  . oxyCODONE-acetaminophen (PERCOCET/ROXICET) 5-325 MG per tablet 1-2 tablet  1-2 tablet Oral Q4H PRN Etta Quill, DO   2 tablet at 10/18/15 1233  . predniSONE (DELTASONE) tablet 60 mg  60 mg Oral Q breakfast Geradine Girt, DO      . simvastatin (ZOCOR) tablet 20 mg  20 mg Oral QPM Etta Quill, DO   20 mg at 10/18/15 1756  . vancomycin (VANCOCIN) IVPB 750 mg/150 ml premix  750 mg Intravenous Q12H Franky Macho, RPH   750 mg at 10/19/15 [id number]     Discharge Medications: Please see discharge summary  for a list of discharge medications.  Relevant Imaging Results:  Relevant Lab Results:  Recent Labs    Additional Information SS#: [id number]  Cranford Mon, LCSW

## 2015-10-19 NOTE — Progress Notes (Signed)
Physical Therapy Treatment Patient Details Name: [name] MRN: [id number] DOB: 05/20/1961 Today's Date: 10/19/2015    History of Present Illness Patient is a 54 y/o male presents with right elbow / wrist / forearm swelling and pain. In ED, pt febrile to 100.8 with WBC of 10.9k.EDP aspirated his olecranon bursa and got back foul smelling cloudy fluid which was sent off to the lab for culture. PMH of DM, bursitis of the R elbow in 2014 that grew out bacillus species but apparently was not operated on and resovled with empiric levaquin., HTN, HLD,gout.     PT Comments    Pt is getting up to walk with PT unexpectedly but is demonstrating some safe mobility to maneuver inside.  His plan is to get home with a friend and will follow up with outdoor mobility there.  No especially great difficulty expected to transition home now.  Follow Up Recommendations  Home health PT     Equipment Recommendations  None recommended by PT    Recommendations for Other Services OT consult     Precautions / Restrictions Precautions Precautions: Fall Restrictions Weight Bearing Restrictions: No    Mobility  Bed Mobility Overal bed mobility: Modified Independent                Transfers Overall transfer level: Modified independent Equipment used: None Transfers: Sit to/from [facility] Sit to Stand: Modified independent (Device/Increase time) Stand pivot transfers: Modified independent (Device/Increase time)          Ambulation/Gait Ambulation/Gait assistance: Supervision Ambulation Distance (Feet): 120 Feet Assistive device: 1 person hand held assist (for safety) Gait Pattern/deviations: Step-to pattern;Step-through pattern;Wide base of support;Drifts right/left;Decreased weight shift to right Gait velocity: reduced Gait velocity interpretation: Below normal speed for age/gender     Stairs            Wheelchair Mobility    Modified Rankin (Stroke  Patients Only)       Balance Overall balance assessment: Modified Independent Sitting-balance support: Feet supported Sitting balance-Leahy Scale: Good     Standing balance support: No upper extremity supported Standing balance-Leahy Scale: Fair                      Cognition Arousal/Alertness: Awake/alert Behavior During Therapy: WFL for tasks assessed/performed Overall Cognitive Status: Within Functional Limits for tasks assessed (very unconcerned about safety)       Memory: Decreased recall of precautions              Exercises      General Comments General comments (skin integrity, edema, etc.): Pt insisted on trying to walk farther and was at supervisory level on ideal level surface,  Will likely have outdoor issues and will recommend HHPT continue to come see him.      Pertinent Vitals/Pain Pain Assessment: No/denies pain    Home Living                      Prior Function            PT Goals (current goals can now be found in the care plan section)      Frequency  Min 3X/week    PT Plan      Co-evaluation             End of Session   Activity Tolerance: Patient tolerated treatment well Patient left: in chair;with call bell/phone within reach;with chair alarm set     Time:  [id number] PT Time Calculation (min) (ACUTE ONLY): 13 min  Charges:  $Gait Training: 8-22 mins                    G Codes:      Ramond Dial 10-27-2015, 3:18 PM   Mee Hives, PT MS Acute Rehab Dept. Number: [phone] [phone] and [place] [phone]

## 2015-10-19 NOTE — Care Management Note (Signed)
Case Management Note  Patient Details  Name: [name] MRN: [id number] Date of Birth: June 11, 1961  Subjective/Objective:   Patient is for dc today to home, he has no insurance so will not be able to get hhpt unless he pays privately which he says he can not pay.  Patient states his brother will be picking him up today.  NCM gave patient [facility] [employer] and informed him about his follow up apt and NCM assisted patient with Match Letter for medications colchicine 0.6 mg which is $193 and patient does not have funds.                  Action/Plan:   Expected Discharge Date:                  Expected Discharge Plan:  Home/Self Care  In-House Referral:  Clinical Social Work  Discharge planning Services  CM Consult  Post Acute Care Choice:    Choice offered to:     DME Arranged:    DME Agency:     HH Arranged:    [place] Agency:     Status of Service:  Completed, signed off  Medicare Important Message Given:    Date Medicare IM Given:    Medicare IM give by:    Date Additional Medicare IM Given:    Additional Medicare Important Message give by:     If discussed at [place] of Stay Meetings, dates discussed:    Additional Comments:  Zenon Mayo, RN 10/19/2015, 11:57 AM

## 2015-10-19 NOTE — Discharge Summary (Addendum)
PATIENT DETAILS Name: [name] Age: 54 y.o. Sex: male Date of Birth: 1961/05/22 MRN: [id number]. Admitting Physician: Etta Quill, DO [id number] LEWIS, MD  Admit Date: 10/17/2015 Discharge date: 10/19/2015  Recommendations for Outpatient Follow-up:  1. Optimize prophylactic treatment for Gout 2. Ensure follow up with Orthopedics.  3. Please repeat CBC/BMET at next visit 4. Please follow blood and synovial fluid cultures till final  PRIMARY DISCHARGE DIAGNOSIS:  Principal Problem:   Acute Gouty Arthritis of elbow Active Problems:   IDDM (insulin dependent diabetes mellitus) ([place])     PAST MEDICAL HISTORY: Past Medical History  Diagnosis Date  . Hypertension   . Type II diabetes mellitus ([place])   . Hyperlipidemia   . Hx of gout   . Septic arthritis of elbow, right ([place]) 10/17/2015    DISCHARGE MEDICATIONS: Current Discharge Medication List    START taking these medications   Details  colchicine 0.6 MG tablet Take 1 tablet (0.6 mg total) by mouth daily. Qty: 30 tablet, Refills: 0    predniSONE (DELTASONE) 10 MG tablet Take 6 tablets (60 mg) daily for 2 days, then, Take 5 tablets (50 mg) daily for 2 days, then, Take 4 tablets (40 mg) daily for 2 days, then, Take 3 tablets (30 mg) daily for 2 days, then, Take 2 tablets (20 mg) daily for 2 days, then, Take 1 tablets (10 mg) daily for 1 days, then stop Qty: 29 tablet, Refills: 0      CONTINUE these medications which have NOT CHANGED   Details  atenolol (TENORMIN) 50 MG tablet Take 1 tablet (50 mg total) by mouth daily. Qty: 120 tablet, Refills: 2    ibuprofen (ADVIL,MOTRIN) 200 MG tablet Take 200 mg by mouth every 6 (six) hours as needed for moderate pain.    insulin aspart protamine- aspart (NOVOLOG MIX 70/30) (70-30) 100 UNIT/ML injection Inject 0.4 mLs (40 Units total) into the skin 2 (two) times daily with a meal. Qty: 10 mL, Refills: 10    metFORMIN (GLUCOPHAGE) 500 MG tablet Take 1 tablet  (500 mg total) by mouth 2 (two) times daily with a meal. Qty: 60 tablet, Refills: 2    oxyCODONE-acetaminophen (PERCOCET/ROXICET) 5-325 MG per tablet Take 1-2 tablets by mouth every 4 (four) hours as needed for pain. Qty: 17 tablet, Refills: 0    simvastatin (ZOCOR) 20 MG tablet Take 1 tablet (20 mg total) by mouth every evening. Qty: 60 tablet, Refills: 2      STOP taking these medications     meloxicam (MOBIC) 15 MG tablet      diclofenac (VOLTAREN) 75 MG EC tablet         ALLERGIES:  No Known Allergies  BRIEF HPI:  See H&P, Labs, Consult and Test reports for all details in brief, patient is a 54 yo with PMHx of DM-2, Gout-presented with right elblow pain and swelling  CONSULTATIONS:   orthopedic surgery  PERTINENT RADIOLOGIC STUDIES: Dg Elbow Complete Right  10/17/2015  CLINICAL DATA:  54 year old male with right hand injury. Patient reports right elbow tenderness. EXAM: RIGHT ELBOW - COMPLETE 3+ VIEW COMPARISON:  None. FINDINGS: There is no evidence of fracture, dislocation, or joint effusion. A 5 mm bony spur noted from the olecranon. There is mild soft tissue thickening of the posterior elbow and proximal forearm. Clinical correlation is recommended to evaluate for olecranon bursitis. IMPRESSION: No acute fracture or dislocation. Mild soft tissues swelling over the olecranon. Clinical correlation is recommended to evaluate for olecranon bursitis.  Electronically Signed   By: Anner Crete M.D.   On: 10/17/2015 20:40   Dg Wrist Complete Right  10/17/2015  CLINICAL DATA:  Injury last week EXAM: RIGHT WRIST - COMPLETE 3+ VIEW COMPARISON:  03/17/2015 FINDINGS: No acute fracture. No dislocation. Soft tissue swelling about the wrist is noted. IMPRESSION: No acute bony pathology. Electronically Signed   By: Marybelle Killings M.D.   On: 10/17/2015 20:33   Dg Hand Complete Right  10/17/2015  CLINICAL DATA:  Right handpain and swelling after recent trauma, pain is worse EXAM: RIGHT  HAND - COMPLETE 3+ VIEW COMPARISON:  None. FINDINGS: Mild arthritic change third and fourth metacarpophalangeal joints. Limited evaluation of the interphalangeal joints due to persistent flexion of the patient's fingers. No significant degenerative change, fracture, or dislocation appreciated involving these. Capitate and hamate both show several small cystic areas. These appear similar to the prior study. There is soft tissue swelling involving the hand diffusely. IMPRESSION: Nonspecific soft tissue swelling with mild degenerative change. No acute findings and no significant change from 03/17/2015 except for soft tissue swelling. Electronically Signed   By: Skipper Cliche M.D.   On: 10/17/2015 20:33     PERTINENT LAB RESULTS: CBC:  Recent Labs  10/17/15 2033  WBC 10.9*  HGB 12.3*  HCT 38.2*  PLT 262   CMET CMP     Component Value Date/Time   NA 134* 10/17/2015 2033   K 4.2 10/17/2015 2033   CL 98* 10/17/2015 2033   CO2 24 10/17/2015 2033   GLUCOSE 101* 10/17/2015 2033   BUN 19 10/17/2015 2033   CREATININE 1.18 10/17/2015 2033   CREATININE 1.18 10/13/2013 1054   CALCIUM 9.8 10/17/2015 2033   PROT 7.7 10/13/2013 1054   ALBUMIN 4.4 10/13/2013 1054   AST 28 10/13/2013 1054   ALT 28 10/13/2013 1054   ALKPHOS 55 10/13/2013 1054   BILITOT 0.6 10/13/2013 1054   GFRNONAA >60 10/17/2015 2033   GFRNONAA 70 10/13/2013 1054   GFRAA >60 10/17/2015 2033   GFRAA 80 10/13/2013 1054    GFR Estimated Creatinine Clearance: 79.3 mL/min (by C-G formula based on Cr of 1.18). No results for input(s): LIPASE, AMYLASE in the last 72 hours. No results for input(s): CKTOTAL, CKMB, CKMBINDEX, TROPONINI in the last 72 hours. Invalid input(s): POCBNP No results for input(s): DDIMER in the last 72 hours. No results for input(s): HGBA1C in the last 72 hours. No results for input(s): CHOL, HDL, LDLCALC, TRIG, CHOLHDL, LDLDIRECT in the last 72 hours. No results for input(s): TSH, T4TOTAL, T3FREE,  THYROIDAB in the last 72 hours.  Invalid input(s): FREET3 No results for input(s): VITAMINB12, FOLATE, FERRITIN, TIBC, IRON, RETICCTPCT in the last 72 hours. Coags: No results for input(s): INR in the last 72 hours.  Invalid input(s): PT Microbiology: Recent Results (from the past 240 hour(s))  Body fluid culture     Status: None (Preliminary result)   Collection Time: 10/17/15 11:50 PM  Result Value Ref Range Status   Specimen Description SYNOVIAL FLUID  Final   Special Requests  LEFT ELBOW  Final   Gram Stain   Final    MODERATE WBC PRESENT,BOTH PMN AND MONONUCLEAR NO ORGANISMS SEEN    Culture NO GROWTH < 12 HOURS  Final   Report Status PENDING  Incomplete     BRIEF HOSPITAL COURSE:  Acute Gouty Arthritis of left elbow:Initially thought to have septic bursitis and started on IV Antibioitcs. Ortho was consulted and recommended medical management with IV Abx and  outpatient follow up. A arthrocentesis was done on  admission which showed only 2260 WBC's and showed crystals. This was then thought to be more likely from acute gouty arthritis. Patient was then started on prednisone on 11/30 with rapid improvement this am (per patient 80-90% better). Synovial fluid cultures are also negative. We will discontinue all Antibiotics, start tapering prednisone and low dose colcichine and then discharge home. Note on day of discharge-significant reduction in right elbow swelling (now just minimal) and very mild pain on generous palpation/squeezing of the elbow. I have asked him to follow with PCP and ortho MD for further continued care.   DM-2:Continue regular insulin regimen and Metformin on discharge.   Dyslipidemia:continue statin  [id number] Atenolol  TODAY-DAY OF DISCHARGE:  Subjective:   [name] today has no headache,no chest abdominal pain,no new weakness tingling or numbness, feels much better wants to go home today.   Objective:   Blood pressure 116/67, pulse  62, temperature 98.6 F (37 C), temperature source Oral, resp. rate 18, height 5\' 5"  (1.651 m), weight 108.183 kg (238 lb 8 oz), SpO2 100 %.  Intake/Output Summary (Last 24 hours) at 10/19/15 1023 Last data filed at 10/19/15 0629  Gross per 24 hour  Intake 3205.42 ml  Output    900 ml  Net 2305.42 ml   Filed Weights   10/17/15 1831 10/18/15 0146  Weight: 108.665 kg (239 lb 9 oz) 108.183 kg (238 lb 8 oz)    Exam Awake Alert, Oriented *3, No new F.N deficits, Normal affect [place].AT,PERRAL Supple Neck,No JVD, No cervical lymphadenopathy appriciated.  Symmetrical Chest wall movement, Good air movement bilaterally, CTAB RRR,No Gallops,Rubs or new Murmurs, No Parasternal Heave +ve B.Sounds, Abd Soft, Non tender, No organomegaly appriciated, No rebound -guarding or rigidity. No Cyanosis, Clubbing or edema, No new Rash or bruise  DISCHARGE CONDITION: Stable  DISPOSITION: Home  DISCHARGE INSTRUCTIONS:    Activity:  As tolerated   Get Medicines reviewed and adjusted: Please take all your medications with you for your next visit with your Primary MD  Please request your Primary MD to go over all hospital tests and procedure/radiological results at the follow up, please ask your Primary MD to get all Hospital records sent to his/her office.  If you experience worsening of your admission symptoms, develop shortness of breath, life threatening emergency, suicidal or homicidal thoughts you must seek medical attention immediately by calling 911 or calling your MD immediately  if symptoms less severe.  You must read complete instructions/literature along with all the possible adverse reactions/side effects for all the Medicines you take and that have been prescribed to you. Take any new Medicines after you have completely understood and accpet all the possible adverse reactions/side effects.   Do not drive when taking Pain medications.   Do not take more than prescribed Pain, Sleep and  Anxiety Medications  Special Instructions: If you have smoked or chewed Tobacco  in the last 2 yrs please stop smoking, stop any regular Alcohol  and or any Recreational drug use.  Wear Seat belts while driving.  Please note  You were cared for by a hospitalist during your hospital stay. Once you are discharged, your primary care physician will handle any further medical issues. Please note that NO REFILLS for any discharge medications will be authorized once you are discharged, as it is imperative that you return to your primary care physician (or establish a relationship with a primary care physician if you do not have one)  for your aftercare needs so that they can reassess your need for medications and monitor your lab values.   Diet recommendation: Diabetic Diet Heart Healthy diet  Discharge Instructions    Call MD for:  redness, tenderness, or signs of infection (pain, swelling, redness, odor or green/yellow discharge around incision site)    Complete by:  As directed      Diet - low sodium heart healthy    Complete by:  As directed      Diet Carb Modified    Complete by:  As directed      Increase activity slowly    Complete by:  As directed            Follow-up Information    Follow up with [facility] On 11/01/2015.   Why:  12 noon for hospital follow up   Contact information:   [address] [place] [phone]      Follow up with GRAVES,JOHN L, MD. Schedule an appointment as soon as possible for a visit in 10 days.   Specialty:  Orthopedic Surgery   Why:  Hospital follow up   Contact information:   [address] [place] [address] [phone]       Total Time spent on discharge equals 45 minutes.  SignedOren Binet 10/19/2015 10:23 AM

## 2015-10-19 NOTE — Progress Notes (Signed)
Pharmacist Provided - Patient Medication Education Prior to Discharge   [name] is an 54 y.o. male who presented to [facility] on 10/17/2015 with a chief complaint of  Chief Complaint  Patient presents with  . Hand Pain     []  Patient will be discharged with 2 new medications []  Patient being discharged without any new medications  The following medications were discussed with the patient: colchicine, prednisone, diclofenac, meloxicam   Pain Control medications: [x]  Yes    []  No  Diabetes Medications: []  Yes    []  No  Heart Failure Medications: []  Yes    []  No  Anticoagulation Medications:  []  Yes    []  No  Antibiotics at discharge: []  Yes    []  No  Allergy Assessment Completed and Updated: [x]  Yes    []  No Identified Patient Allergies: No Known Allergies   Medication Adherence Assessment: []  Excellent (no doses missed/week)      []  Good (1 dose missed/week)      []  Partial (2-3 doses missed/week)      [x]  Poor (>3 doses missed/week)  Barriers to Obtaining Medications: []  Yes [x]  No  [name] expressed no concern with obtaining medications   Assessment: Discussed proper dosing and adverse effects for colchicine and prednisone. Pt states he has experienced with these medications previously. Also discussed discontinuation of diclofenac and meloxicam.Instructed to follow up at [facility]. Seen at [facility] previously but not in the past two years.   Time spent preparing for discharge counseling: 10 min Time spent counseling patient: 20 min  [name], PharmD 10/19/2015, 4:22 PM

## 2015-10-21 LAB — BODY FLUID CULTURE: CULTURE: NO GROWTH

## 2015-10-23 LAB — CULTURE, BLOOD (ROUTINE X 2)
Culture: NO GROWTH
Culture: NO GROWTH

## 2015-11-01 ENCOUNTER — Inpatient Hospital Stay: Payer: Self-pay | Admitting: Family Medicine

## 2015-12-04 ENCOUNTER — Emergency Department (HOSPITAL_COMMUNITY)
Admission: EM | Admit: 2015-12-04 | Discharge: 2015-12-04 | Disposition: A | Discharge status: Home / Self Care | Payer: Self-pay | Attending: Emergency Medicine | Admitting: Emergency Medicine

## 2015-12-04 ENCOUNTER — Emergency Department (HOSPITAL_COMMUNITY): Payer: Self-pay

## 2015-12-04 ENCOUNTER — Encounter (HOSPITAL_COMMUNITY): Payer: Self-pay | Admitting: Emergency Medicine

## 2015-12-04 DIAGNOSIS — Z79899 Other long term (current) drug therapy: Secondary | ICD-10-CM | POA: Insufficient documentation

## 2015-12-04 DIAGNOSIS — M25462 Effusion, left knee: Secondary | ICD-10-CM | POA: Insufficient documentation

## 2015-12-04 DIAGNOSIS — M109 Gout, unspecified: Secondary | ICD-10-CM | POA: Insufficient documentation

## 2015-12-04 DIAGNOSIS — M25562 Pain in left knee: Secondary | ICD-10-CM | POA: Insufficient documentation

## 2015-12-04 DIAGNOSIS — Z794 Long term (current) use of insulin: Secondary | ICD-10-CM | POA: Insufficient documentation

## 2015-12-04 DIAGNOSIS — M25461 Effusion, right knee: Secondary | ICD-10-CM | POA: Insufficient documentation

## 2015-12-04 DIAGNOSIS — M25561 Pain in right knee: Secondary | ICD-10-CM | POA: Insufficient documentation

## 2015-12-04 DIAGNOSIS — E785 Hyperlipidemia, unspecified: Secondary | ICD-10-CM | POA: Insufficient documentation

## 2015-12-04 DIAGNOSIS — I10 Essential (primary) hypertension: Secondary | ICD-10-CM | POA: Insufficient documentation

## 2015-12-04 DIAGNOSIS — Z7984 Long term (current) use of oral hypoglycemic drugs: Secondary | ICD-10-CM | POA: Insufficient documentation

## 2015-12-04 MED ORDER — HYDROCODONE-ACETAMINOPHEN 5-325 MG PO TABS: 1.0000 | ORAL_TABLET | Freq: Four times a day (QID) | ORAL | Status: DC | PRN

## 2015-12-04 NOTE — ED Notes (Signed)
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

## 2015-12-04 NOTE — ED Notes (Signed)
PA student at bedside.

## 2015-12-04 NOTE — ED Notes (Signed)
Patient was alert, oriented and stable upon discharge. RN went over AVS and patient had no further questions.  

## 2015-12-04 NOTE — ED Provider Notes (Signed)
CSN: [id number]     Arrival date & time 12/04/15  1434 History  By signing my name below, I, Rayna Sexton, attest that this documentation has been prepared under the direction and in the presence of [employer], PA-C. Electronically Signed: Rayna Sexton, ED Scribe. 12/04/2015. 4:00 PM.    Chief Complaint  Patient presents with  . Leg Pain   The history is provided by the patient. No language interpreter was used.    HPI Comments: [name] is a 55 y.o. male with a hx of DM, HTN, HLD, septic joints and gout who presents to the Emergency Department complaining of constant, moderate, bilateral knee pain that radiates to his ankles onset 1 week ago. He notes elevating his legs, applying ice/heat and taking ibuprofen and denies any relief. Pt denies any hx of similar symptoms noting he works at [employer] and stands for long periods of time. He notes having taken his insulin regularly and denies any recent blood glucose abnormalities. Pt notes scaling, dryness and mild swelling to his bilateral LE's which he says is chronic, unchanged and is unsure of the cause. He denies fevers, chills, numbness, tingling, n/v or any other associated symptoms at this time.    Past Medical History  Diagnosis Date  . Hypertension   . Type II diabetes mellitus ([place])   . Hyperlipidemia   . Hx of gout   . Septic arthritis of elbow, right ([place]) 10/17/2015   Past Surgical History  Procedure Laterality Date  . No past surgeries     Family History  Problem Relation Age of Onset  . Diabetes Mother   . Diabetes Father    Social History  Substance Use Topics  . Smoking status: Never Smoker   . Smokeless tobacco: Never Used  . Alcohol Use: No    Review of Systems  Constitutional: Negative for fever and chills.  Cardiovascular: Positive for leg swelling.  Gastrointestinal: Negative for nausea and vomiting.  Musculoskeletal: Positive for myalgias, joint swelling and arthralgias.  Skin:  Negative for color change and wound.  Neurological: Negative for numbness.    Allergies  Review of patient's allergies indicates no known allergies.  Home Medications   Prior to Admission medications   Medication Sig Start Date End Date Taking? Authorizing Provider  atenolol (TENORMIN) 50 MG tablet Take 1 tablet (50 mg total) by mouth daily. 10/13/13   Reyne Dumas, MD  colchicine 0.6 MG tablet Take 1 tablet (0.6 mg total) by mouth daily. 10/19/15   Shanker Kristeen Mans, MD  ibuprofen (ADVIL,MOTRIN) 200 MG tablet Take 200 mg by mouth every 6 (six) hours as needed for moderate pain.    Historical Provider, MD  insulin aspart protamine- aspart (NOVOLOG MIX 70/30) (70-30) 100 UNIT/ML injection Inject 0.4 mLs (40 Units total) into the skin 2 (two) times daily with a meal. 10/13/13   Reyne Dumas, MD  metFORMIN (GLUCOPHAGE) 500 MG tablet Take 1 tablet (500 mg total) by mouth 2 (two) times daily with a meal. 10/13/13   Reyne Dumas, MD  oxyCODONE-acetaminophen (PERCOCET/ROXICET) 5-325 MG per tablet Take 1-2 tablets by mouth every 4 (four) hours as needed for pain. 07/15/13   Virgel Manifold, MD  predniSONE (DELTASONE) 10 MG tablet Take 6 tablets (60 mg) daily for 2 days, then, Take 5 tablets (50 mg) daily for 2 days, then, Take 4 tablets (40 mg) daily for 2 days, then, Take 3 tablets (30 mg) daily for 2 days, then, Take 2 tablets (20 mg) daily  for 2 days, then, Take 1 tablets (10 mg) daily for 1 days, then stop 10/19/15   Jonetta Osgood, MD  simvastatin (ZOCOR) 20 MG tablet Take 1 tablet (20 mg total) by mouth every evening. 10/13/13   Reyne Dumas, MD   BP 131/74 mmHg  Pulse 79  Temp(Src) 98.2 F (36.8 C) (Oral)  SpO2 100%    Physical Exam  Constitutional: He is oriented to person, place, and time. He appears well-developed and well-nourished.  HENT:  Head: Normocephalic and atraumatic.  Eyes: EOM are normal.  Neck: Normal range of motion.  Cardiovascular: Normal rate, regular rhythm and  normal heart sounds.   Pulses:      Dorsalis pedis pulses are 2+ on the right side, and 2+ on the left side.  Pulmonary/Chest: Effort normal and breath sounds normal. No respiratory distress.  Abdominal: Soft.  Musculoskeletal: He exhibits tenderness.  TTP of knees bilaterally. No obvious edema, erythema or warmth.  Pain with ROM of the knees bilaterally.  ROM somewhat limited secondary to pain.  Neurological: He is alert and oriented to person, place, and time.  Distal sensation of bilateral feet intact  Skin: Skin is warm and dry.  Psychiatric: He has a normal mood and affect.  Nursing note and vitals reviewed.   ED Course  Procedures  DIAGNOSTIC STUDIES: Oxygen Saturation is 100% on RA, normal by my interpretation.    COORDINATION OF CARE: 3:57 PM Pt presents today due to bilateral knee pain. Discussed treatment plan with pt at bedside including DG's of his bilateral knees and reevaluation based on results of the imaging. Pt agreed to plan.  Labs Review Labs Reviewed - No data to display  Imaging Review Dg Knee Complete 4 Views Left  12/04/2015  CLINICAL DATA:  One week history of bilateral knee pain. EXAM: LEFT KNEE - COMPLETE 4+ VIEW; RIGHT KNEE - COMPLETE 4+ VIEW COMPARISON:  Radiographs 12/19/2009 and 05/04/2008 FINDINGS: Bilateral knees: Progressive and significant tricompartmental degenerative changes involving both knees. There is joint space narrowing, osteophytic spurring and subchondral cystic change. No chondrocalcinosis. Bilateral joint effusions. No acute fracture or osteochondral lesion. 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 No acute fracture. Bilateral joint effusions. Electronically Signed   By: Marijo Sanes M.D.   On: 12/04/2015 16:35   Dg Knee Complete 4 Views Right  12/04/2015  CLINICAL DATA:  One week history of bilateral knee pain. EXAM: LEFT KNEE - COMPLETE 4+ VIEW; RIGHT KNEE - COMPLETE 4+ VIEW COMPARISON:   Radiographs 12/19/2009 and 05/04/2008 FINDINGS: Bilateral knees: Progressive and significant tricompartmental degenerative changes involving both knees. There is joint space narrowing, osteophytic spurring and subchondral cystic change. No chondrocalcinosis. Bilateral joint effusions. No acute fracture or osteochondral lesion. 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 No acute fracture. Bilateral joint effusions. Electronically Signed   By: Marijo Sanes M.D.   On: 12/04/2015 16:35   I have personally reviewed and evaluated these images as part of my medical decision-making.   EKG Interpretation None      MDM   Final diagnoses:  None  Patient presents today with a chief complaint of bilateral knee pain.  No acute injury or trauma.  No erythema or warmth of the knees.  Patient afebrile.  Therefore, doubt septic joint.  Patient neurovascularly intact.  2+ DP pulses bilaterally.  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  Patient given referral to Orthopedics.  Feel that the patient is stable for discharge.  Return precautions  given.    I personally performed the services described in this documentation, which was scribed in my presence. The recorded information has been reviewed and is accurate.    Hyman Bible, PA-C 12/05/15 [place] Liu, MD 12/06/15 2020

## 2015-12-23 ENCOUNTER — Inpatient Hospital Stay (HOSPITAL_COMMUNITY)
Admission: EM | Admit: 2015-12-23 | Discharge: 2016-01-01 | DRG: 489 | Disposition: A | Discharge status: Skilled Nursing Facility | Payer: Self-pay | Attending: Internal Medicine | Admitting: Internal Medicine

## 2015-12-23 ENCOUNTER — Encounter (HOSPITAL_COMMUNITY): Payer: Self-pay | Admitting: Emergency Medicine

## 2015-12-23 DIAGNOSIS — S83206A Unspecified tear of unspecified meniscus, current injury, right knee, initial encounter: Secondary | ICD-10-CM

## 2015-12-23 DIAGNOSIS — S83282A Other tear of lateral meniscus, current injury, left knee, initial encounter: Secondary | ICD-10-CM | POA: Diagnosis present

## 2015-12-23 DIAGNOSIS — M546 Pain in thoracic spine: Secondary | ICD-10-CM

## 2015-12-23 DIAGNOSIS — D649 Anemia, unspecified: Secondary | ICD-10-CM | POA: Diagnosis present

## 2015-12-23 DIAGNOSIS — D638 Anemia in other chronic diseases classified elsewhere: Secondary | ICD-10-CM | POA: Diagnosis present

## 2015-12-23 DIAGNOSIS — M94262 Chondromalacia, left knee: Secondary | ICD-10-CM | POA: Diagnosis present

## 2015-12-23 DIAGNOSIS — M25561 Pain in right knee: Secondary | ICD-10-CM | POA: Diagnosis present

## 2015-12-23 DIAGNOSIS — S83207A Unspecified tear of unspecified meniscus, current injury, left knee, initial encounter: Secondary | ICD-10-CM

## 2015-12-23 DIAGNOSIS — E559 Vitamin D deficiency, unspecified: Secondary | ICD-10-CM | POA: Diagnosis present

## 2015-12-23 DIAGNOSIS — I10 Essential (primary) hypertension: Secondary | ICD-10-CM | POA: Diagnosis present

## 2015-12-23 DIAGNOSIS — M899 Disorder of bone, unspecified: Secondary | ICD-10-CM | POA: Diagnosis present

## 2015-12-23 DIAGNOSIS — M6588 Other synovitis and tenosynovitis, other site: Secondary | ICD-10-CM | POA: Diagnosis present

## 2015-12-23 DIAGNOSIS — E785 Hyperlipidemia, unspecified: Secondary | ICD-10-CM | POA: Diagnosis present

## 2015-12-23 DIAGNOSIS — E119 Type 2 diabetes mellitus without complications: Secondary | ICD-10-CM | POA: Diagnosis present

## 2015-12-23 DIAGNOSIS — M25461 Effusion, right knee: Secondary | ICD-10-CM

## 2015-12-23 DIAGNOSIS — M6250 Muscle wasting and atrophy, not elsewhere classified, unspecified site: Secondary | ICD-10-CM | POA: Diagnosis present

## 2015-12-23 DIAGNOSIS — L409 Psoriasis, unspecified: Secondary | ICD-10-CM | POA: Diagnosis present

## 2015-12-23 DIAGNOSIS — Z794 Long term (current) use of insulin: Secondary | ICD-10-CM

## 2015-12-23 DIAGNOSIS — M109 Gout, unspecified: Secondary | ICD-10-CM | POA: Insufficient documentation

## 2015-12-23 DIAGNOSIS — R262 Difficulty in walking, not elsewhere classified: Secondary | ICD-10-CM

## 2015-12-23 DIAGNOSIS — M11861 Other specified crystal arthropathies, right knee: Secondary | ICD-10-CM

## 2015-12-23 DIAGNOSIS — M11862 Other specified crystal arthropathies, left knee: Secondary | ICD-10-CM

## 2015-12-23 DIAGNOSIS — E1149 Type 2 diabetes mellitus with other diabetic neurological complication: Secondary | ICD-10-CM

## 2015-12-23 DIAGNOSIS — M170 Bilateral primary osteoarthritis of knee: Principal | ICD-10-CM | POA: Diagnosis present

## 2015-12-23 DIAGNOSIS — M25562 Pain in left knee: Secondary | ICD-10-CM

## 2015-12-23 DIAGNOSIS — M94261 Chondromalacia, right knee: Secondary | ICD-10-CM | POA: Diagnosis present

## 2015-12-23 DIAGNOSIS — M1A9XX0 Chronic gout, unspecified, without tophus (tophi): Secondary | ICD-10-CM | POA: Diagnosis present

## 2015-12-23 DIAGNOSIS — G8929 Other chronic pain: Secondary | ICD-10-CM | POA: Diagnosis present

## 2015-12-23 DIAGNOSIS — M25462 Effusion, left knee: Secondary | ICD-10-CM

## 2015-12-23 DIAGNOSIS — IMO0001 Reserved for inherently not codable concepts without codable children: Secondary | ICD-10-CM

## 2015-12-23 DIAGNOSIS — D509 Iron deficiency anemia, unspecified: Secondary | ICD-10-CM | POA: Diagnosis present

## 2015-12-23 DIAGNOSIS — Z833 Family history of diabetes mellitus: Secondary | ICD-10-CM

## 2015-12-23 DIAGNOSIS — M129 Arthropathy, unspecified: Secondary | ICD-10-CM

## 2015-12-23 DIAGNOSIS — S83241A Other tear of medial meniscus, current injury, right knee, initial encounter: Secondary | ICD-10-CM | POA: Diagnosis present

## 2015-12-23 DIAGNOSIS — Z7401 Bed confinement status: Secondary | ICD-10-CM

## 2015-12-23 DIAGNOSIS — M549 Dorsalgia, unspecified: Secondary | ICD-10-CM | POA: Diagnosis present

## 2015-12-23 DIAGNOSIS — M5136 Other intervertebral disc degeneration, lumbar region: Secondary | ICD-10-CM | POA: Diagnosis present

## 2015-12-23 DIAGNOSIS — R52 Pain, unspecified: Secondary | ICD-10-CM | POA: Diagnosis present

## 2015-12-23 DIAGNOSIS — M1710 Unilateral primary osteoarthritis, unspecified knee: Secondary | ICD-10-CM | POA: Insufficient documentation

## 2015-12-23 HISTORY — DX: Gout, unspecified: M10.9

## 2015-12-23 HISTORY — DX: Pain in unspecified knee: M25.569

## 2015-12-23 HISTORY — DX: Other chronic pain: G89.29

## 2015-12-23 LAB — CBC WITH DIFFERENTIAL/PLATELET
Basophils Absolute: 0 10*3/uL (ref 0.0–0.1)
Basophils Relative: 0 %
EOS ABS: 0.1 10*3/uL (ref 0.0–0.7)
EOS PCT: 1 %
HCT: 37.7 % — ABNORMAL LOW (ref 39.0–52.0)
Hemoglobin: 12 g/dL — ABNORMAL LOW (ref 13.0–17.0)
Lymphocytes Relative: 19 %
Lymphs Abs: 1.6 10*3/uL (ref 0.7–4.0)
MCH: 22.9 pg — AB (ref 26.0–34.0)
MCHC: 31.8 g/dL (ref 30.0–36.0)
MCV: 71.9 fL — ABNORMAL LOW (ref 78.0–100.0)
MONO ABS: 0.4 10*3/uL (ref 0.1–1.0)
MONOS PCT: 5 %
Neutro Abs: 6.3 10*3/uL (ref 1.7–7.7)
Neutrophils Relative %: 75 %
PLATELETS: 240 10*3/uL (ref 150–400)
RBC: 5.24 MIL/uL (ref 4.22–5.81)
RDW: 16 % — ABNORMAL HIGH (ref 11.5–15.5)
WBC: 8.4 10*3/uL (ref 4.0–10.5)

## 2015-12-23 LAB — BASIC METABOLIC PANEL
Anion gap: 13 (ref 5–15)
BUN: 21 mg/dL — AB (ref 6–20)
CO2: 21 mmol/L — ABNORMAL LOW (ref 22–32)
CREATININE: 0.78 mg/dL (ref 0.61–1.24)
Calcium: 9.7 mg/dL (ref 8.9–10.3)
Chloride: 106 mmol/L (ref 101–111)
GFR calc Af Amer: >60 mL/min (ref >60)
GLUCOSE: 123 mg/dL — AB (ref 65–99)
Potassium: 4.2 mmol/L (ref 3.5–5.1)
SODIUM: 140 mmol/L (ref 135–145)

## 2015-12-23 LAB — SEDIMENTATION RATE: Sed Rate: 111 mm/hr — ABNORMAL HIGH (ref 0–16)

## 2015-12-23 MED ORDER — INSULIN ASPART 100 UNIT/ML ~~LOC~~ SOLN
4.0000 [IU] | Freq: Three times a day (TID) | SUBCUTANEOUS | Status: DC
  Administered 2015-12-26 – 2015-12-27 (×4): 4 [IU] via SUBCUTANEOUS

## 2015-12-23 MED ORDER — ONDANSETRON HCL 4 MG/2ML IJ SOLN: 4.0000 mg | Freq: Four times a day (QID) | INTRAMUSCULAR | Status: DC | PRN

## 2015-12-23 MED ORDER — HYDROMORPHONE HCL 1 MG/ML IJ SOLN: 1.0000 mg | Freq: Once | INTRAMUSCULAR | Status: DC

## 2015-12-23 MED ORDER — HYDROMORPHONE HCL 1 MG/ML IJ SOLN
1.0000 mg | Freq: Once | INTRAMUSCULAR | Status: AC
  Administered 2015-12-23: 1 mg via INTRAVENOUS
  Filled 2015-12-23: qty 1

## 2015-12-23 MED ORDER — INSULIN GLARGINE 100 UNIT/ML ~~LOC~~ SOLN
25.0000 [IU] | Freq: Every day | SUBCUTANEOUS | Status: DC
  Administered 2015-12-24 – 2015-12-28 (×6): 25 [IU] via SUBCUTANEOUS
  Filled 2015-12-23 (×7): qty 0.25

## 2015-12-23 MED ORDER — ACETAMINOPHEN 650 MG RE SUPP: 650.0000 mg | Freq: Four times a day (QID) | RECTAL | Status: DC | PRN

## 2015-12-23 MED ORDER — ENOXAPARIN SODIUM 40 MG/0.4ML ~~LOC~~ SOLN
40.0000 mg | Freq: Every day | SUBCUTANEOUS | Status: DC
  Administered 2015-12-24 – 2016-01-01 (×8): 40 mg via SUBCUTANEOUS
  Filled 2015-12-23 (×8): qty 0.4

## 2015-12-23 MED ORDER — ATENOLOL 50 MG PO TABS
50.0000 mg | ORAL_TABLET | Freq: Every day | ORAL | Status: DC
  Administered 2015-12-24 – 2016-01-01 (×8): 50 mg via ORAL
  Filled 2015-12-23 (×8): qty 1

## 2015-12-23 MED ORDER — ACETAMINOPHEN 325 MG PO TABS: 650.0000 mg | ORAL_TABLET | Freq: Four times a day (QID) | ORAL | Status: DC | PRN

## 2015-12-23 MED ORDER — INSULIN ASPART 100 UNIT/ML ~~LOC~~ SOLN
0.0000 [IU] | Freq: Three times a day (TID) | SUBCUTANEOUS | Status: DC
  Administered 2015-12-24: 2 [IU] via SUBCUTANEOUS
  Administered 2015-12-26 – 2015-12-27 (×3): 3 [IU] via SUBCUTANEOUS
  Administered 2015-12-27 – 2015-12-31 (×4): 2 [IU] via SUBCUTANEOUS
  Administered 2015-12-31 – 2016-01-01 (×2): 3 [IU] via SUBCUTANEOUS

## 2015-12-23 MED ORDER — SODIUM CHLORIDE 0.9 % IV BOLUS (SEPSIS)
1000.0000 mL | Freq: Once | INTRAVENOUS | Status: AC
  Administered 2015-12-23: 1000 mL via INTRAVENOUS

## 2015-12-23 MED ORDER — SENNOSIDES-DOCUSATE SODIUM 8.6-50 MG PO TABS: 1.0000 | ORAL_TABLET | Freq: Every evening | ORAL | Status: DC | PRN

## 2015-12-23 MED ORDER — KETOROLAC TROMETHAMINE 15 MG/ML IJ SOLN
15.0000 mg | Freq: Four times a day (QID) | INTRAMUSCULAR | Status: AC | PRN
  Administered 2015-12-24 – 2015-12-26 (×3): 15 mg via INTRAVENOUS
  Filled 2015-12-23 (×3): qty 1

## 2015-12-23 MED ORDER — DOCUSATE SODIUM 100 MG PO CAPS
100.0000 mg | ORAL_CAPSULE | Freq: Two times a day (BID) | ORAL | Status: DC
  Administered 2015-12-24 – 2015-12-28 (×10): 100 mg via ORAL
  Filled 2015-12-23 (×10): qty 1

## 2015-12-23 MED ORDER — KETOROLAC TROMETHAMINE 15 MG/ML IJ SOLN
15.0000 mg | Freq: Once | INTRAMUSCULAR | Status: AC
  Administered 2015-12-23: 15 mg via INTRAVENOUS
  Filled 2015-12-23: qty 1

## 2015-12-23 MED ORDER — BISACODYL 5 MG PO TBEC
5.0000 mg | DELAYED_RELEASE_TABLET | Freq: Every day | ORAL | Status: DC | PRN
  Administered 2015-12-31: 5 mg via ORAL
  Filled 2015-12-23: qty 1

## 2015-12-23 MED ORDER — HYDROMORPHONE HCL 1 MG/ML IJ SOLN
1.0000 mg | INTRAMUSCULAR | Status: DC | PRN
  Administered 2015-12-24: 1 mg via INTRAVENOUS
  Filled 2015-12-23: qty 1

## 2015-12-23 MED ORDER — ONDANSETRON HCL 4 MG PO TABS: 4.0000 mg | ORAL_TABLET | Freq: Four times a day (QID) | ORAL | Status: DC | PRN

## 2015-12-23 MED ORDER — HYDROCODONE-ACETAMINOPHEN 5-325 MG PO TABS
1.0000 | ORAL_TABLET | ORAL | Status: DC | PRN
  Administered 2015-12-25: 2 via ORAL
  Administered 2015-12-25: 1 via ORAL
  Administered 2015-12-25 (×2): 2 via ORAL
  Administered 2015-12-25: 1 via ORAL
  Administered 2015-12-26 – 2015-12-27 (×3): 2 via ORAL
  Filled 2015-12-23: qty 1
  Filled 2015-12-23 (×3): qty 2
  Filled 2015-12-23: qty 1
  Filled 2015-12-23 (×4): qty 2

## 2015-12-23 NOTE — ED Provider Notes (Signed)
CSN: [id number]     Arrival date & time 12/23/15  1922 History   First MD Initiated Contact with Patient 12/23/15 1936     Chief Complaint  Patient presents with  . Back Pain     (Consider location/radiation/quality/duration/timing/severity/associated sxs/prior Treatment) HPI Comments: 55 year old male with history of severe arthritis knees, insulin diabetic, elbow septic bursitis presents with worsening knee pain. Patient has had this for months and was seen in the ER 2 weeks prior. Patient has severe arthritis and was told in the past he had let the swelling go down. Patient was supposed to follow-up with orthopedics and never did. Patient says he is been almost bed bound due to pain since he left the ER. No fevers or chills. No new injuries. Patient has intermittent chronic back pain no worsening back pain, no focal weakness primarily pain in the knees.  Patient is a 55 y.o. male presenting with back pain. The history is provided by the patient and medical records.  Back Pain Associated symptoms: no abdominal pain, no chest pain, no dysuria, no fever and no headaches     Past Medical History  Diagnosis Date  . Hypertension   . Type II diabetes mellitus ([place])   . Hyperlipidemia   . Hx of gout   . Septic arthritis of elbow, right ([place]) 10/17/2015   Past Surgical History  Procedure Laterality Date  . No past surgeries     Family History  Problem Relation Age of Onset  . Diabetes Mother   . Diabetes Father    Social History  Substance Use Topics  . Smoking status: Never Smoker   . Smokeless tobacco: Never Used  . Alcohol Use: No    Review of Systems  Constitutional: Positive for appetite change. Negative for fever and chills.  HENT: Negative for congestion.   Eyes: Negative for visual disturbance.  Respiratory: Negative for shortness of breath.   Cardiovascular: Negative for chest pain.  Gastrointestinal: Negative for vomiting and abdominal pain.  Genitourinary: Negative  for dysuria and flank pain.  Musculoskeletal: Positive for back pain, joint swelling and arthralgias. Negative for neck pain and neck stiffness.  Skin: Negative for rash.  Neurological: Negative for light-headedness and headaches.      Allergies  Review of patient's allergies indicates no known allergies.  Home Medications   Prior to Admission medications   Medication Sig Start Date End Date Taking? Authorizing Provider  atenolol (TENORMIN) 50 MG tablet Take 1 tablet (50 mg total) by mouth daily. 10/13/13  Yes Reyne Dumas, MD  colchicine 0.6 MG tablet Take 1 tablet (0.6 mg total) by mouth daily. 10/19/15  Yes Shanker Kristeen Mans, MD  HYDROcodone-acetaminophen (NORCO/VICODIN) 5-325 MG tablet Take 1-2 tablets by mouth every 6 (six) hours as needed. Patient taking differently: Take 1-2 tablets by mouth every 6 (six) hours as needed for moderate pain.  12/04/15  Yes Heather Laisure, PA-C  ibuprofen (ADVIL,MOTRIN) 200 MG tablet Take 600 mg by mouth every 6 (six) hours as needed for moderate pain.    Yes Historical Provider, MD  insulin aspart protamine- aspart (NOVOLOG MIX 70/30) (70-30) 100 UNIT/ML injection Inject 0.4 mLs (40 Units total) into the skin 2 (two) times daily with a meal. 10/13/13  Yes Reyne Dumas, MD  metFORMIN (GLUCOPHAGE) 500 MG tablet Take 1 tablet (500 mg total) by mouth 2 (two) times daily with a meal. 10/13/13  Yes Reyne Dumas, MD  predniSONE (DELTASONE) 10 MG tablet Take 6 tablets (60 mg) daily for 2  days, then, Take 5 tablets (50 mg) daily for 2 days, then, Take 4 tablets (40 mg) daily for 2 days, then, Take 3 tablets (30 mg) daily for 2 days, then, Take 2 tablets (20 mg) daily for 2 days, then, Take 1 tablets (10 mg) daily for 1 days, then stop Patient not taking: Reported on 12/04/2015 10/19/15   Jonetta Osgood, MD  simvastatin (ZOCOR) 20 MG tablet Take 1 tablet (20 mg total) by mouth every evening. Patient not taking: Reported on 12/04/2015 10/13/13   Reyne Dumas,  MD   BP 126/93 mmHg  Pulse 84  Temp(Src) 98.5 F (36.9 C) (Oral)  Resp 20  Ht 5\' 5"  (1.651 m)  Wt 238 lb (107.956 kg)  BMI 39.61 kg/m2  SpO2 95% Physical Exam  Constitutional: He is oriented to person, place, and time. He appears well-developed and well-nourished.  HENT:  Head: Normocephalic and atraumatic.  Eyes: Conjunctivae are normal. Right eye exhibits no discharge. Left eye exhibits no discharge.  Neck: Normal range of motion. Neck supple. No tracheal deviation present.  Cardiovascular: Normal rate and regular rhythm.   Pulmonary/Chest: Effort normal and breath sounds normal.  Abdominal: Soft. He exhibits no distension. There is no tenderness. There is no guarding.  Musculoskeletal: He exhibits edema and tenderness.  Patient has moderate knee effusion is bilateral, diffuse tenderness bilateral anterior knees. Patient has significant tenderness with flexion of both knees. No rash appreciated. Difficult exam due to pain.  Neurological: He is alert and oriented to person, place, and time.  Patient has sensation to palpation lower legs, difficult neuro exam due to pain in knees  Skin: Skin is warm. No rash noted.  Psychiatric: He has a normal mood and affect.  Nursing note and vitals reviewed.   ED Course  Procedures (including critical care time) Labs Review Labs Reviewed  CBC WITH DIFFERENTIAL/PLATELET - Abnormal; Notable for the following:    Hemoglobin 12.0 (*)    HCT 37.7 (*)    MCV 71.9 (*)    MCH 22.9 (*)    RDW 16.0 (*)    All other components within normal limits  BASIC METABOLIC PANEL - Abnormal; Notable for the following:    CO2 21 (*)    Glucose, Bld 123 (*)    BUN 21 (*)    All other components within normal limits  SEDIMENTATION RATE    Imaging Review No results found. I have personally reviewed and evaluated these images and lab results as part of my medical decision-making.   EKG Interpretation None      MDM   Final diagnoses:  Bilateral  knee pain  Arthritis of knee  Inability to ambulate due to knee   Patient resents with worsening bilateral knee pain. No fever no what blood cell count. Patient's had this for 2 weeks. Patient has been bedbound discussed with fianc. With significant pain, inability to ambulate in the ER despite pain meds. Discussed with [facility] hospitalist for admission for physical therapy pain control and possible orthopedic consult. Reviewed recent x-rays of the knees severe arthritis.  The patients results and plan were reviewed and discussed.   Any x-rays performed were independently reviewed by myself.   Differential diagnosis were considered with the presenting HPI.  Medications  HYDROmorphone (DILAUDID) injection 1 mg (1 mg Intravenous Given 12/23/15 2146)  sodium chloride 0.9 % bolus 1,000 mL (1,000 mLs Intravenous New Bag/Given 12/23/15 2146)  HYDROmorphone (DILAUDID) injection 1 mg (1 mg Intravenous Given 12/23/15 2232)  Filed Vitals:   12/23/15 2021 12/23/15 2100 12/23/15 2200 12/23/15 2230  BP: 137/80 123/88 128/88 126/93  Pulse: 78 82 79 84  Temp:      TempSrc:      Resp: 20     Height:      Weight:      SpO2: 99% 96% 94% 95%    Final diagnoses:  Bilateral knee pain  Arthritis of knee  Inability to ambulate due to knee    Admission/ observation were discussed with the admitting physician, patient and/or family and they are comfortable with the plan.      Elnora Morrison, MD 12/23/15 [phone]

## 2015-12-23 NOTE — ED Notes (Signed)
Zavitz MD at bedside.

## 2015-12-23 NOTE — H&P (Signed)
[facility] History and Physical  [name] [id number] DOB: 09/25/1961 DOA: 12/23/2015  Referring physician: ED physician PCP: Lenor Derrick, MD  Specialists: none   Chief Complaint:  Disabling knee pain, bilateral   HPI: [name] is a 55 y.o. 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 Mr. Menne was seen in the emergency department on 12/04/2015 with severe bilateral knee pain. Radiographs were obtained and demonstrated tricompartmental osteoarthritis of bilateral knees. His pain was adequately controlled and outpatient follow-up with orthopedic surgery was scheduled. Unfortunately, since returning home, the patient has been unable to get out of bed due to pain, therefore missing his outpatient orthopedic consultation.  Patient's knee pain is been going on for many years, progressively worsening. He has long history of working on his feet, most recently in a kitchen. He describes his pain as constant, severe, achy, localized to the bilateral knees, worse with any movement, and only slightly alleviated with ice, heat, Advil, and Norco. There is been no recent trauma, no discoloration or gross deformity, and no significant swelling or erythema of the knees. Patient denies any recent fever, chills, sore throat, or dysuria. Mr. Fedder also denies chest pain, palpitations, headache, or focal numbness or weakness. He reports being unable to walk, not due to weakness, but due to the excruciating pain that results. He denies numbness in the lower distal extremities. He denies incontinence, weight loss, fevers, or sweats.  In ED, patient was found to be afebrile, saturating well on room air, and with vital signs stable. Basic blood work  was obtained, revealing a microcytic anemia appears stable relative to her priors. Chem panel reveals modest elevation in serum glucose and elevated [id number] ratio. The patient was bolused with 1 L of normal saline in the emergency department, treated with 1 mg IV push Dilaudid 2, remained hemodynamically stable, but still unable to ambulate secondary to pain. He will be admitted under observation status for pain control and further evaluation of his complaints.  Where does patient live?   At home     Can patient participate in ADLs?  Yes      Review of Systems:   General: no fevers, chills, sweats, weight change, poor appetite, or fatigue HEENT: no blurry vision, hearing changes or sore throat Pulm: no dyspnea, cough, or wheeze CV: no chest pain or palpitations Abd: no nausea, vomiting, abdominal pain, diarrhea, or constipation GU: no dysuria, hematuria, increased urinary frequency, or urgency  Ext: no leg edema Neuro: no focal weakness, numbness, or tingling, no vision change or hearing loss Skin: no rash, no wounds MSK: No muscle spasm, no deformity, no red, hot, or swollen joint Heme: No easy bruising or bleeding Travel history: No recent long distant travel    Allergy: No Known Allergies  Past Medical History  Diagnosis Date  . Hypertension   . Type II diabetes mellitus ([place])   . Hyperlipidemia   . Hx of gout   . Septic arthritis of elbow, right ([place]) 10/17/2015    Past Surgical History  Procedure Laterality Date  . No past surgeries      Social History:  reports that he has never smoked. He has never used smokeless tobacco. He reports that he does not drink alcohol or use illicit drugs.  Family History:  Family History  Problem Relation Age of Onset  . Diabetes Mother   . Diabetes Father      Prior to Admission medications   Medication Sig Start Date End Date Taking? Authorizing Provider  atenolol (TENORMIN) 50 MG tablet Take 1 tablet (50 mg total) by mouth  daily. 10/13/13  Yes Reyne Dumas, MD  colchicine 0.6 MG tablet Take 1 tablet (0.6 mg total) by mouth daily. 10/19/15  Yes Shanker Kristeen Mans, MD  HYDROcodone-acetaminophen (NORCO/VICODIN) 5-325 MG tablet Take 1-2 tablets by mouth every 6 (six) hours as needed. Patient taking differently: Take 1-2 tablets by mouth every 6 (six) hours as needed for moderate pain.  12/04/15  Yes Heather Laisure, PA-C  ibuprofen (ADVIL,MOTRIN) 200 MG tablet Take 600 mg by mouth every 6 (six) hours as needed for moderate pain.    Yes Historical Provider, MD  insulin aspart protamine- aspart (NOVOLOG MIX 70/30) (70-30) 100 UNIT/ML injection Inject 0.4 mLs (40 Units total) into the skin 2 (two) times daily with a meal. 10/13/13  Yes Reyne Dumas, MD  metFORMIN (GLUCOPHAGE) 500 MG tablet Take 1 tablet (500 mg total) by mouth 2 (two) times daily with a meal. 10/13/13  Yes Reyne Dumas, MD  predniSONE (DELTASONE) 10 MG tablet Take 6 tablets (60 mg) daily for 2 days, then, Take 5 tablets (50 mg) daily for 2 days, then, Take 4 tablets (40 mg) daily for 2 days, then, Take 3 tablets (30 mg) daily for 2 days, then, Take 2 tablets (20 mg) daily for 2 days, then, Take 1 tablets (10 mg) daily for 1 days, then stop Patient not taking: Reported on 12/04/2015 10/19/15   Jonetta Osgood, MD  simvastatin (ZOCOR) 20 MG tablet Take 1 tablet (20 mg total) by mouth every evening. Patient not taking: Reported on 12/04/2015 10/13/13   Reyne Dumas, MD    Physical Exam: Filed Vitals:   12/23/15 2021 12/23/15 2100 12/23/15 2200 12/23/15 2230  BP: 137/80 123/88 128/88 126/93  Pulse: 78 82 79 84  Temp:      TempSrc:      Resp: 20   18  Height:      Weight:      SpO2: 99% 96% 94% 95%   General: Not in acute distress, but obvious discomfort HEENT:       Eyes: PERRL, EOMI, no scleral icterus or conjunctival pallor.       ENT: No discharge from the ears or nose, no pharyngeal ulcers, petechiae or exudate, no tonsillar enlargement.         Neck: No JVD, no bruit, no appreciable mass Heme: No cervical adenopathy, no pallor Cardiac: S1/S2, RRR, No murmurs, No gallops or rubs. Pulm: Good air movement bilaterally. No rales, wheezing, rhonchi or rubs. Abd: Soft, nondistended, nontender, no rebound pain or gaurding, no mass or organomegaly, BS present. Ext: No LE edema bilaterally. 2+DP/PT pulse bilaterally.  Musculoskeletal: No gross deformity, no red, hot, swollen joints. LE ROM severely limited by pain; knees with slight swelling b/l, but no erythema or heat. Knee pain reproduced with palpation, particularly the anterior aspect. Lochman's neg b/l; stable to valgus/varus stressing b/l.  No bony spine tenderness, but paraspinal soft tissues tender at mid back.  Skin: No rashes or wounds on exposed surfaces  Neuro: Alert, oriented X3, cranial nerves II-XII grossly intact, sensation to light touch intact in distal LEs b/l. Moves both feet, strength testing limited by pain. . No focal findings Psych: Patient is not overtly psychotic, appropriate mood  and affect.  Labs on Admission:  Basic Metabolic Panel:  Recent Labs Lab 12/23/15 2152  NA 140  K 4.2  CL 106  CO2 21*  GLUCOSE 123*  BUN 21*  CREATININE 0.78  CALCIUM 9.7   Liver Function Tests: No results for input(s): AST, ALT, ALKPHOS, BILITOT, PROT, ALBUMIN in the last 168 hours. No results for input(s): LIPASE, AMYLASE in the last 168 hours. No results for input(s): AMMONIA in the last 168 hours. CBC:  Recent Labs Lab 12/23/15 2152  WBC 8.4  NEUTROABS 6.3  HGB 12.0*  HCT 37.7*  MCV 71.9*  PLT 240   Cardiac Enzymes: No results for input(s): CKTOTAL, CKMB, CKMBINDEX, TROPONINI in the last 168 hours.  BNP (last 3 results) No results for input(s): BNP in the last 8760 hours.  ProBNP (last 3 results) No results for input(s): PROBNP in the last 8760 hours.  CBG: No results for input(s): GLUCAP in the last 168 hours.  Radiological Exams on Admission: No  results found.  EKG:  Not done in ED, will obtain as appropriate   Assessment/Plan  1. Osteoarthritis with intractable bilateral knee pain  - Radiographs b/l knees obtained 12/04/15, demonstrating severe and progressive tricompartmental degeneration bilaterally  - Pain not adequately controlled with heat, ice, Advil, and Norco at home; pt bed-bound since mid-January d/t pain with movement - No erythema or heat to suggest gout or infectious process; no systemic s/s of infxn; suspect this all severe OA  - Missed outpt ortho consultation d/t disabling pain and resulting inability to get out of bed  - Pain improved with Toradol and Dilaudid in ED, will continue with these agent for now, transitioning to orals as tolerated  - Pt has never had intraarticular injection, may be an option as he is hoping to avoid any near-term surgical intervention if possible  - PT/OT consultation requested; pt may need wheelchair at time of discharge    2. Sub-acute mid-back pain  - No wt-loss, sweats, fevers, or radiation - Pt attributes to not being out of bed in ~3 wks - Will obtain plain radiographs to r/o mass, treat pain as above   3. Hypertension  - At goal so far in ED - Will continue home-dose atenalol 50 mg qD  - Adjust regimen prn   4. Insulin-dependent DM  - Taking metformin 500 mg BID, and Novolog 70/30 40 units sq BID  - Will hold home meds while inpt  - Check CBG with meals and qHS  - Basal coverage with Lantus 25 units qHS, mealtime coverage, and sliding-scale correctional  - Will likely need to escalate dosing, monitoring CBG  - A1c pending   5. 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 Chronic gout  - Typically in Rt elbow, occasionally in feet  - Does not appear to be active at this time  - Check uric acid, monitor      DVT ppx:  SQ Lovenox     Code Status: Full  code Family Communication:  Yes, patient's fiance at bed side Disposition Plan: Admit to inpatient   Date of Service 12/23/2015    Vianne Bulls, MD [facility] Pager [phone]  If 7PM-7AM, please contact night-coverage [email or web address] Password [facility] 12/23/2015, 11:31 PM

## 2015-12-23 NOTE — ED Notes (Signed)
Rolled patient to assess for skin breakdown, no abnormalities noted.

## 2015-12-23 NOTE — ED Notes (Signed)
Pt arrives via EMS from home with c/o back pain ongoing since his last visit with hospital in January. Pt has not gotten out of bed since returning from hospital. Does not tolerate movement well. VSS. Alert and oriented x4, states today he just got tired of being in the bed so he called 911.

## 2015-12-23 NOTE — ED Notes (Signed)
Attempted IV access x1, unsuccessful. Second RN to attempt.

## 2015-12-23 NOTE — ED Notes (Signed)
Pt refused XRAY

## 2015-12-24 ENCOUNTER — Encounter (HOSPITAL_COMMUNITY): Payer: Self-pay | Admitting: Orthopedic Surgery

## 2015-12-24 DIAGNOSIS — M1710 Unilateral primary osteoarthritis, unspecified knee: Secondary | ICD-10-CM | POA: Insufficient documentation

## 2015-12-24 DIAGNOSIS — R262 Difficulty in walking, not elsewhere classified: Secondary | ICD-10-CM | POA: Insufficient documentation

## 2015-12-24 DIAGNOSIS — D509 Iron deficiency anemia, unspecified: Secondary | ICD-10-CM | POA: Insufficient documentation

## 2015-12-24 LAB — GLUCOSE, CAPILLARY
GLUCOSE-CAPILLARY: 105 mg/dL — AB (ref 65–99)
GLUCOSE-CAPILLARY: 115 mg/dL — AB (ref 65–99)
Glucose-Capillary: 113 mg/dL — ABNORMAL HIGH (ref 65–99)
Glucose-Capillary: 122 mg/dL — ABNORMAL HIGH (ref 65–99)
Glucose-Capillary: 96 mg/dL (ref 65–99)

## 2015-12-24 LAB — IRON AND TIBC
IRON: 53 ug/dL (ref 45–182)
Saturation Ratios: 31 % (ref 17.9–39.5)
TIBC: 171 ug/dL — AB (ref 250–450)
UIBC: 118 ug/dL

## 2015-12-24 LAB — C-REACTIVE PROTEIN: CRP: 9.4 mg/dL — ABNORMAL HIGH (ref <1.0)

## 2015-12-24 LAB — FERRITIN: FERRITIN: 415 ng/mL — AB (ref 24–336)

## 2015-12-24 LAB — URIC ACID: URIC ACID, SERUM: 7.2 mg/dL (ref 4.4–7.6)

## 2015-12-24 MED ORDER — METHYLPREDNISOLONE ACETATE 40 MG/ML IJ SUSP
40.0000 mg | INTRAMUSCULAR | Status: AC
  Filled 2015-12-24: qty 1

## 2015-12-24 MED ORDER — LIDOCAINE-EPINEPHRINE 1 %-1:100000 IJ SOLN
30.0000 mL | INTRAMUSCULAR | Status: AC
  Filled 2015-12-24: qty 30

## 2015-12-24 MED ORDER — METHYLPREDNISOLONE ACETATE 40 MG/ML IJ SUSP
40.0000 mg | INTRAMUSCULAR | Status: AC
  Filled 2015-12-24 (×2): qty 1

## 2015-12-24 MED ORDER — BUPIVACAINE HCL (PF) 0.5 % IJ SOLN
30.0000 mL | INTRAMUSCULAR | Status: AC
  Filled 2015-12-24: qty 30

## 2015-12-24 NOTE — Progress Notes (Signed)
Occupational Therapy Evaluation Patient Details Name: [name] MRN: [id number] DOB: 30-May-1961 Today's Date: 12/24/2015    History of Present Illness 55 y.o. 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 Pt with recent hospital visit in January 2017 and pt reports he has not gotten out bed since that time due to severe arthritis and swelling in bilateral knees. PMH significant for HTN, DM type II, HLD, gout, and septic arthritis of R elbow.   Clinical Impression   PTA, pt was mostly bed bound and required assistance with all ADLs. Pt currently presents with severe bilateral knee pain, decreased activity tolerance, and some cognitive deficits (unsure if this is baseline). Pt required mod-max+2 assist for bed mobility and to progress to sitting EOB. Pt completed grooming and bathing tasks sitting EOB with set up assist and required mod verbal cues to sequence through tasks. Pt will benefit from continued acute OT to increase independence and safety with ADLs and mobility. Recommend SNF for post-acute rehab stay and 3in1, RW, and wheelchair with cushion.    Follow Up Recommendations  SNF;Supervision/Assistance - 24 hour    Equipment Recommendations  3 in 1 bedside comode;Other (comment);Wheelchair (measurements OT);Wheelchair cushion (measurements OT) (RW-2 wheeled)    Recommendations for Other Services       Precautions / Restrictions Precautions Precautions: Fall Restrictions Weight Bearing Restrictions: No      Mobility Bed Mobility Overal bed mobility: Needs Assistance;+2 for physical assistance Bed Mobility: Rolling;Supine to Sit;Sit to Supine Rolling: Mod assist;+2 for physical assistance   Supine to sit: Max assist;+2 for physical assistance Sit to supine: Max assist;+2 for physical assistance   General bed mobility comments: Mod-max +2 assist to progress bilateral LE on/off bed, to pivot hips and scoot to EOB with use of bed pad.  Sequencing verbal cues for hand placement and to pull trunk up to sitting position.  Transfers                 General transfer comment: Unable to progress with transfers due to pain. Pt agreeable to very small movements of distal LEs while sitting EOB, scooting a washcloth on the floor with his foot.    Balance Overall balance assessment: Needs assistance Sitting-balance support: No upper extremity supported;Feet supported Sitting balance-Leahy Scale: Fair Sitting balance - Comments: min guard assist for sitting EOB with occassional use of UE for support                                    ADL Overall ADL's : Needs assistance/impaired     Grooming: Wash/dry face;Oral care;Set up;Sitting   Upper Body Bathing: Set up;Sitting   Lower Body Bathing: Set up;Sitting/lateral leans Lower Body Bathing Details (indicate cue type and reason): for front perineal bathing Upper Body Dressing : Set up;Sitting           Toileting- Clothing Manipulation and Hygiene: Maximal assistance;+2 for physical assistance;Bed level       Functional mobility during ADLs: Maximal assistance;+2 for physical assistance General ADL Comments: Pt with severe bilateral knee pain and unable to tolerate weight-bearing on LEs. Completed grooming and bathing tasks sitting EOB with set up assist. Pt with poor hygiene - bloody saliva noted after brushing teeth and pt washed perineal area and then face with same wash cloth - cued to use different wash cloth. Pt also incontinent of bowel discovered when returning to bed -  required max +2 assist to roll and complete pericare.     Vision Vision Assessment?: No apparent visual deficits   Perception     Praxis      Pertinent Vitals/Pain Pain Assessment: Faces Faces Pain Scale: Hurts whole lot Pain Location: bilateral knees Pain Descriptors / Indicators: Aching;Sore Pain Intervention(s): Limited activity within patient's tolerance;Monitored  during session;Repositioned     Hand Dominance Right   Extremity/Trunk Assessment Upper Extremity Assessment Upper Extremity Assessment: Overall WFL for tasks assessed   Lower Extremity Assessment Lower Extremity Assessment: RLE deficits/detail;LLE deficits/detail RLE Deficits / Details: Severe pain with light touch and movement, no AROM or PROM tolerated, resting position in approx 10 degrees of flexion, suspect probable hamstring shortening/tightness RLE: Unable to fully assess due to pain LLE Deficits / Details: Severe pain with light touch and movement (slightly less painful than R side), AROM minimal (~20 degrees) at knee, no PROM tolerated, resting position in approx 10 degrees of flexion, suspect probable hamstring shortening/tightness LLE: Unable to fully assess due to pain   Cervical / Trunk Assessment Cervical / Trunk Assessment: Normal   Communication Communication Communication: No difficulties   Cognition Arousal/Alertness: Awake/alert Behavior During Therapy: Anxious Overall Cognitive Status: Impaired/Different from baseline Area of Impairment: Following commands;Problem solving       Following Commands: Follows one step commands inconsistently;Follows one step commands with increased time     Problem Solving: Slow processing;Decreased initiation;Requires verbal cues;Requires tactile cues General Comments: Pt repeating single words stated by therapists throughout session and continually yelled "I can't, I can't. Please ma'am" even when no movement occurring and was unable to verbalize what was wrong. Pt had difficulty following commands consistently and would not respond to questions asked. RN notified.   General Comments       Exercises       Shoulder Instructions      Home Living Family/patient expects to be discharged to:: Private residence Living Arrangements: Alone Available Help at Discharge: Friend(s);Available 24 hours/day Type of Home:  Apartment Home Access: Level entry     Home Layout: One level     Bathroom Shower/Tub: Teacher, early years/pre: Standard     Home Equipment: Cane - single point          Prior Functioning/Environment Level of Independence: Needs assistance  Gait / Transfers Assistance Needed: Mostly bed bound - girlfriend assisted moving bilateral LE off bed so pt could sit EOB ADL's / Homemaking Assistance Needed: Uses urinal jug and bed pan, girlfriend assists with pericare, dressing, bathing, and all IADLs   Comments: Pt reports he has independent, 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    OT Diagnosis: Generalized weakness;Cognitive deficits;Acute pain   OT Problem List: Decreased strength;Decreased range of motion;Decreased activity tolerance;Impaired balance (sitting and/or standing);Decreased coordination;Decreased cognition;Decreased safety awareness;Decreased knowledge of use of DME or AE;Obesity;Pain   OT Treatment/Interventions: Self-care/ADL training;Therapeutic exercise;Energy conservation;DME and/or AE instruction;Therapeutic activities;Cognitive remediation/compensation;Patient/family education;Balance training    OT Goals(Current goals can be found in the care plan section) Acute Rehab OT Goals Patient Stated Goal: to get out of this bed OT Goal Formulation: With patient Time For Goal Achievement: 01/07/16 Potential to Achieve Goals: Fair ADL Goals Pt Will Perform Lower Body Bathing: with min guard assist;sitting/lateral leans Pt Will Perform Lower Body Dressing: with min guard assist;sitting/lateral leans Pt Will Transfer to Toilet: with mod assist;stand pivot transfer;bedside commode Pt Will Perform Toileting - Clothing Manipulation and hygiene: with min assist;sitting/lateral leans  OT Frequency: Min 2X/week   Barriers to D/C: Decreased caregiver support  Unsure of assistance available for pt       Co-evaluation PT/OT/SLP  Co-Evaluation/Treatment: Yes Reason for Co-Treatment: Complexity of the patient's impairments (multi-system involvement);For patient/therapist safety PT goals addressed during session: Mobility/safety with mobility;Balance OT goals addressed during session: ADL's and self-care;Strengthening/ROM      End of Session Nurse Communication: Mobility status;Other (comment) (Mental status during session, and incontinence)  Activity Tolerance: Patient limited by pain Patient left: in bed;with call bell/phone within reach;with bed alarm set   Time: [id number] OT Time Calculation (min): 33 min Charges:  OT General Charges $OT Visit: 1 Procedure OT Evaluation $OT Eval Moderate Complexity: 1 Procedure G-Codes:    Redmond Baseman 12-27-2015, 5:19 PM

## 2015-12-24 NOTE — Consult Note (Signed)
[facility]edic [facility] Service (OTS) Consult   Reason for Consult: Bilateral knee pain Referring Physician: Nada Libman, MD (internal medicine)   HPI: [name] is an 55 y.o. black male with history of diabetes, hypertension, gout and hyperlipidemia hospital yesterday bilateral knee pain. Patient was admitted to the medicine service for pain control. He was recently seen in the emergency department around 12/04/2015 with similar complaints. X-rays were performed at that time which demonstrated severe tricompartmental degenerative joint disease in both knees. Patient was instructed to follow-up with Dr. Berenice Primas whom he has seen before for olecranon bursitis back in November 2016. Patient's pain was so severe that he has been unable to walk for the last 5 days or so. He has been bedridden. He has been unable to follow-up with orthopedics. We were contacted to see the patient as the hospitalist service was having difficulty getting in touch with [facility].   Currently patient is in 5 N. room 15 complains of severe bilateral knee pain, right worse than the left. He has been unable to ambulate for the last several days. denies any fevers or chills no recent illnesses. He has been feeling well otherwise other than his bilateral knee pain. Denies any additional joint pain  Patient states that his diabetes is fairly well-controlled. His sugars usually run in the 120s.   Patient has been using combination of ice and heat as well as NSAIDs for pain control. Recently they have had little benefit at this time. Does not recall the last time he had a gout flare in his knee.  Past Medical History  Diagnosis Date  . Hypertension   . Type II diabetes mellitus ([place])   . Hyperlipidemia   . Hx of gout   . Septic bursitis of elbow 10/18/2015    Past Surgical History  Procedure Laterality Date  . No past surgeries      Family History  Problem Relation Age of Onset  . Diabetes Mother   . Diabetes Father      Social History:  reports that he has never smoked. He has never used smokeless tobacco. He reports that he does not drink alcohol or use illicit drugs.  Allergies: No Known Allergies  Medications:  I have reviewed the patient's current medications. Prior to Admission:  Prescriptions prior to admission  Medication Sig Dispense Refill Last Dose  . atenolol (TENORMIN) 50 MG tablet Take 1 tablet (50 mg total) by mouth daily. 120 tablet 2 12/23/2015 at 0700  . colchicine 0.6 MG tablet Take 1 tablet (0.6 mg total) by mouth daily. 30 tablet 0 Past Month at Unknown time  . HYDROcodone-acetaminophen (NORCO/VICODIN) 5-325 MG tablet Take 1-2 tablets by mouth every 6 (six) hours as needed. (Patient taking differently: Take 1-2 tablets by mouth every 6 (six) hours as needed for moderate pain. ) 15 tablet 0 Past Week at Unknown time  . ibuprofen (ADVIL,MOTRIN) 200 MG tablet Take 600 mg by mouth every 6 (six) hours as needed for moderate pain.    12/22/2015 at Unknown time  . insulin aspart protamine- aspart (NOVOLOG MIX 70/30) (70-30) 100 UNIT/ML injection Inject 0.4 mLs (40 Units total) into the skin 2 (two) times daily with a meal. 10 mL 10 12/22/2015 at Unknown time  . metFORMIN (GLUCOPHAGE) 500 MG tablet Take 1 tablet (500 mg total) by mouth 2 (two) times daily with a meal. 60 tablet 2 12/21/2015  . predniSONE (DELTASONE) 10 MG tablet Take 6 tablets (60 mg) daily for 2 days, then,  Take 5 tablets (50 mg) daily for 2 days, then, Take 4 tablets (40 mg) daily for 2 days, then, Take 3 tablets (30 mg) daily for 2 days, then, Take 2 tablets (20 mg) daily for 2 days, then, Take 1 tablets (10 mg) daily for 1 days, then stop (Patient not taking: Reported on 12/04/2015) 29 tablet 0 Not Taking at Unknown time  . simvastatin (ZOCOR) 20 MG tablet Take 1 tablet (20 mg total) by mouth every evening. (Patient not taking: Reported on 12/04/2015) 60 tablet 2 Not Taking at Unknown time    Results for orders placed or  performed during the hospital encounter of 12/23/15 (from the past 48 hour(s))  CBC with Differential/Platelet     Status: Abnormal   Collection Time: 12/23/15  9:52 PM  Result Value Ref Range   WBC 8.4 4.0 - 10.5 K/uL   RBC 5.24 4.22 - 5.81 MIL/uL   Hemoglobin 12.0 (L) 13.0 - 17.0 g/dL   HCT 37.7 (L) 39.0 - 52.0 %   MCV 71.9 (L) 78.0 - 100.0 fL   MCH 22.9 (L) 26.0 - 34.0 pg   MCHC 31.8 30.0 - 36.0 g/dL   RDW 16.0 (H) 11.5 - 15.5 %   Platelets 240 150 - 400 K/uL   Neutrophils Relative % 75 %   Neutro Abs 6.3 1.7 - 7.7 K/uL   Lymphocytes Relative 19 %   Lymphs Abs 1.6 0.7 - 4.0 K/uL   Monocytes Relative 5 %   Monocytes Absolute 0.4 0.1 - 1.0 K/uL   Eosinophils Relative 1 %   Eosinophils Absolute 0.1 0.0 - 0.7 K/uL   Basophils Relative 0 %   Basophils Absolute 0.0 0.0 - 0.1 K/uL  Basic metabolic panel     Status: Abnormal   Collection Time: 12/23/15  9:52 PM  Result Value Ref Range   Sodium 140 135 - 145 mmol/L   Potassium 4.2 3.5 - 5.1 mmol/L   Chloride 106 101 - 111 mmol/L   CO2 21 (L) 22 - 32 mmol/L   Glucose, Bld 123 (H) 65 - 99 mg/dL   BUN 21 (H) 6 - 20 mg/dL   Creatinine, Ser 0.78 0.61 - 1.24 mg/dL   Calcium 9.7 8.9 - 10.3 mg/dL   GFR calc non Af Amer >60 >60 mL/min   GFR calc Af Amer >60 >60 mL/min    Comment: (NOTE) The eGFR has been calculated using the CKD EPI equation. This calculation has not been validated in all clinical situations. eGFR's persistently <60 mL/min signify possible Chronic Kidney Disease.    Anion gap 13 5 - 15  Sedimentation rate     Status: Abnormal   Collection Time: 12/23/15  9:52 PM  Result Value Ref Range   Sed Rate 111 (H) 0 - 16 mm/hr  Glucose, capillary     Status: Abnormal   Collection Time: 12/24/15 12:02 AM  Result Value Ref Range   Glucose-Capillary 115 (H) 65 - 99 mg/dL  Uric acid     Status: None   Collection Time: 12/24/15  1:27 AM  Result Value Ref Range   Uric Acid, Serum 7.2 4.4 - 7.6 mg/dL  C-reactive protein      Status: Abnormal   Collection Time: 12/24/15  1:27 AM  Result Value Ref Range   CRP 9.4 (H) <1.0 mg/dL  Ferritin     Status: Abnormal   Collection Time: 12/24/15  1:27 AM  Result Value Ref Range   Ferritin 415 (H) 24 - 336  ng/mL  Iron and TIBC     Status: Abnormal   Collection Time: 12/24/15  1:27 AM  Result Value Ref Range   Iron 53 45 - 182 ug/dL   TIBC 171 (L) 250 - 450 ug/dL   Saturation Ratios 31 17.9 - 39.5 %   UIBC 118 ug/dL  Glucose, capillary     Status: Abnormal   Collection Time: 12/24/15  7:06 AM  Result Value Ref Range   Glucose-Capillary 113 (H) 65 - 99 mg/dL    No results found.  Review of Systems  Constitutional: Negative for fever and chills.  Respiratory: Negative for shortness of breath and wheezing.   Cardiovascular: Negative for chest pain and palpitations.  Gastrointestinal: Negative for nausea, vomiting and abdominal pain.  Genitourinary: Negative for dysuria.  Musculoskeletal:       Bilateral knee arthralgia  Neurological: Negative for tingling, sensory change and headaches.   Blood pressure 122/89, pulse 80, temperature 98.4 F (36.9 C), temperature source Oral, resp. rate 16, height '5\' 5"'  (1.651 m), weight 107.956 kg (238 lb), SpO2 98 %. Physical Exam  Constitutional: He is cooperative.  Older appearing black male Does appear uncomfortable  Cardiovascular: Regular rhythm.   Pulmonary/Chest:  Breathing is unlabored  Musculoskeletal:  Bilateral lower extremities Inspection:   No gross deformities   No effusions appreciated bilaterally   No erythema around his knees bilaterally Bony eval:   Exquisite tenderness with palpation of his knees bilaterally    Ankles and hips are unremarkable Soft tissue:    Patient with scabbed lesions over his lower legs bilaterally    No drainage appreciated, no erythema    Skin is very dry    muscle atrophy of the quads noted bilaterally ROM:    Patient demonstrates good active and passive range of motion  of his ankles    Patient has pain with attempted range of motion of his knees bilaterally    Also really unable to move his right knee passively as it feels as if it's ankylosed    Left knee does allow some passive motion but patient does not tolerate much beyond 30 Sensation:    DPN, SPN, TN sensory functions grossly intact bilaterally Motor:    EHL, FHL, and tibialis, posterior tibialis, peroneals and gastrocsoleus complex motor function are intact bilaterally Vascular:   + DP pulses bilaterally   Neurological: He is alert.  Nursing note and vitals reviewed.     Imaging  Bilateral knee x-rays from 12/04/2015  Extensive tricompartmental degenerative joint disease with significant joint space narrowing. Extensive osteophyte spurring as well as subchondral cystic changes. Right knee appears to be slightly worse than the left but both are severe  Assessment/Plan:  55 year old black male uncontrollable bilateral knee pain with end-stage bilateral knee degenerative joint disease  1. End-stage bilateral knee DJD with uncontrollable pain and severe limitations on ADLs   Patient has been admitted to the medicine service for pain control  Patient does not appear to have significant knee effusions. No erythema. No fever or elevated white count  I do not think that his knee pain is related to septic joints but rather his extensive osteoarthritis and possibly acute gout flare.  If I am able to obtain fluid on aspiration we will send it down but I do not expect this will be the case  Plan for bilateral knee injections with local anesthetic as well as steroid  As I plan on injecting both knees will use a 40 mg of Depo-Medrol  in each knee for total of 80 mg of Depo-Medrol. We'll need to monitor patient's sugars closely   Physical therapy and occupational therapy consults  Continue with cryo-and heat therapy  Could consider TENS unit or e-stim if easily accessible   Based on patient's  symptoms it would appear that he is in need of arthroplasties of both knees to regain function and quality of life   2. Pain management:  Continue with current regimen  will add Celebrex to see if this helps     3. Medical issues    diabetes   Monitor sugars closely after injections   May need to adjust sliding scale   Hopeful that there will be little systemic effect as these are intra-articular injections but again it is a fairly high dose  4. DVT/PE prophylaxis:   defer to medicine service  5. Metabolic Bone Disease:   patient does have a history of vitamin D deficiency based off of labs in 2014  We will recheck his vitamin D  6. Activity:   PT and OT consults after injections have been completed  Weight-bear as tolerated bilaterally  7. Dispo:   PT and OT evaluations  will contact Dr. Berenice Primas to see if we can expedite an office visit   Jari Pigg, PA-C [facility] [phone] (P) 12/24/2015, 10:25 AM

## 2015-12-24 NOTE — Procedures (Addendum)
Clinician: Jari Pigg, PA-C  Specimen: none  Complications: none  Intra-articular medications: 40 mg depo-medrol each knee, 2 cc 0.5% marcaine each knee and 2 cc 1% lidocaine with epi each knee   Description  Utilizing sterile technique the lateral joint line of the Right was prepped with alcohol swab, followed by betadine swabs x 3.  The SQ tissue was then infiltrated with 5 cc of 1% lidocaine.  After adequate anesthesia of the skin was achieved, an 18 G needle on a 10 cc syringe was then advanced into the knee joint. A palpable pop was appreciated upon entrance to the knee.  I was unable to get any return fluid. The 18 G needle was maintained and syringes were exchanged. A syringe containing the mixture noted above as placed on the needle and then injected into the joint. Needle was removed without difficulty. Gauze and tape were applied to the wound area.   This sequence was repeated for the Left knee as well. Pt tolerated procedures well.   Utilizing sterile technique the lateral joint line of the left was prepped with alcohol swab, followed by betadine swabs x 3.  The SQ tissue was then infiltrated with 5 cc of 1% lidocaine.  After adequate anesthesia of the skin was achieved, an 18 G needle on a 10 cc syringe was then advanced into the knee joint. A palpable pop was appreciated upon entrance to the knee.  I was unable to get any return fluid. The 18 G needle was maintained and syringes were exchanged. A syringe containing the mixture noted above as placed on the needle and then injected into the joint. Needle was removed without difficulty. Gauze and tape were applied to the wound area.      Pt can participate with therapies as he can tolerate. WBAT, no ROM restrictions. 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 He will need to follow up with a joint replacement surgeon as soon as possible to explore his options. Pt was supposed to follow up with Dr. Berenice Primas and he can  reschedule this appointment at discharge.  He does not need to follow up with [place] as we do not do primary TKA's but we are more than happy and willing to facilitate care for the pt   Jari Pigg, PA-C [facility] [phone] (P)

## 2015-12-24 NOTE — Evaluation (Signed)
Physical Therapy Evaluation Patient Details Name: [name] MRN: [id number] DOB: 15-Feb-1961 Today's Date: 12/24/2015   History of Present Illness  55 y.o. 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 Pt with recent hospital visit in January 2017 and pt reports he has not gotten out bed since that time due to severe arthritis and swelling in bilateral knees. PMH significant for HTN, DM type II, HLD, gout, and septic arthritis of R elbow.  Clinical Impression  Pt admitted with above diagnosis. Pt currently with functional limitations due to the deficits listed below (see PT Problem List). On eval, pt required +2 [name] assist for bed mobility. He was unable to progress passed EOB sitting due to bilateral knee pain. Pt will benefit from skilled PT to increase their independence and safety with mobility to allow discharge to the venue listed below.       Follow Up Recommendations SNF;Supervision/Assistance - 24 hour    Equipment Recommendations  Wheelchair (measurements PT);Rolling walker with 5" wheels;Wheelchair cushion (measurements PT)    Recommendations for Other Services       Precautions / Restrictions Precautions Precautions: Fall Restrictions Weight Bearing Restrictions: No      Mobility  Bed Mobility Overal bed mobility: Needs Assistance;+2 for physical assistance Bed Mobility: Rolling;Supine to Sit;Sit to Supine Rolling: Mod assist;+2 for physical assistance   Supine to sit: [name] assist;+2 for physical assistance Sit to supine: [name] assist;+2 for physical assistance   General bed mobility comments: Mod-[name] +2 assist to progress bilateral LE on/off bed, to pivot hips and scoot to EOB with use of bed pad. Sequencing verbal cues for hand placement and to pull trunk up to sitting position.  Transfers                 General transfer comment: Unable to progress with transfers due to pain. Pt agreeable to very small movements of distal  LEs while sitting EOB, scooting a washcloth on the floor with his foot.  Ambulation/Gait             General Gait Details: unable due to pain  Stairs            Wheelchair Mobility    Modified Rankin (Stroke Patients Only)       Balance Overall balance assessment: Needs assistance Sitting-balance support: No upper extremity supported;Feet supported Sitting balance-Leahy Scale: Fair Sitting balance - Comments: min guard assist for sitting EOB with occassional use of UE for support                                     Pertinent Vitals/Pain Pain Assessment: Faces Faces Pain Scale: Hurts whole lot Pain Location: bilateral knees Pain Descriptors / Indicators: Aching;Sore Pain Intervention(s): Limited activity within patient's tolerance;Monitored during session;Repositioned    Home Living Family/patient expects to be discharged to:: Private residence Living Arrangements: Alone Available Help at Discharge: Friend(s);Available 24 hours/day Type of Home: Apartment Home Access: Level entry     Home Layout: One level Home Equipment: Cane - single point      Prior Function Level of Independence: Needs assistance   Gait / Transfers Assistance Needed: Mostly bed bound - girlfriend assisted moving bilateral LE off bed so pt could sit EOB  ADL's / Homemaking Assistance Needed: Uses urinal jug and bed pan, girlfriend assists with pericare, dressing, bathing, and all IADLs  Comments: Pt reports he has independent,  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     Hand Dominance   Dominant Hand: Right    Extremity/Trunk Assessment   Upper Extremity Assessment: Overall WFL for tasks assessed           Lower Extremity Assessment: RLE deficits/detail;LLE deficits/detail RLE Deficits / Details: Severe pain with light touch and movement, no AROM or PROM tolerated, resting position in approx 10 degrees of flexion, suspect probable  hamstring shortening/tightness LLE Deficits / Details: Severe pain with light touch and movement (slightly less painful than R side), AROM minimal (~20 degrees) at knee, no PROM tolerated, resting position in approx 10 degrees of flexion, suspect probable hamstring shortening/tightness  Cervical / Trunk Assessment: Normal  Communication   Communication: No difficulties  Cognition Arousal/Alertness: Awake/alert Behavior During Therapy: Anxious Overall Cognitive Status: Impaired/Different from baseline Area of Impairment: Following commands;Problem solving       Following Commands: Follows one step commands inconsistently;Follows one step commands with increased time     Problem Solving: Slow processing;Decreased initiation;Requires verbal cues;Requires tactile cues General Comments: Pt repeating single words stated by therapists throughout session and continually yelled "I can't, I can't. Please ma'am" even when no movement occurring and was unable to verbalize what was wrong. Pt had difficulty following commands consistently and would not respond to questions asked. RN notified.    General Comments General comments (skin integrity, edema, etc.): Overall very poor hygiene. Dry, scaly skin noted BLE distally. Pt with bloody saliva after brushing teeth and remote BM discovered while performing bed mobility.     Exercises        Assessment/Plan    PT Assessment Patient needs continued PT services  PT Diagnosis Difficulty walking;Acute pain;Generalized weakness;Altered mental status   PT Problem List Decreased strength;Decreased range of motion;Decreased activity tolerance;Decreased balance;Decreased mobility;Pain;Decreased knowledge of use of DME;Decreased cognition  PT Treatment Interventions DME instruction;Gait training;Functional mobility training;Therapeutic activities;Therapeutic exercise;Wheelchair mobility training;Patient/family education;Cognitive remediation;Balance training    PT Goals (Current goals can be found in the Care Plan section) Acute Rehab PT Goals Patient Stated Goal: to get out of this bed PT Goal Formulation: With patient Time For Goal Achievement: 12/24/15 Potential to Achieve Goals: Fair    Frequency Min 3X/week   Barriers to discharge        Co-evaluation PT/OT/SLP Co-Evaluation/Treatment: Yes Reason for Co-Treatment: Complexity of the patient's impairments (multi-system involvement);For patient/therapist safety PT goals addressed during session: Mobility/safety with mobility;Balance OT goals addressed during session: ADL's and self-care;Strengthening/ROM       End of Session   Activity Tolerance: Patient limited by pain Patient left: in bed;with call bell/phone within reach;with bed alarm set Nurse Communication: Mobility status;Other (comment) (cognition diffirent from baseline)    Functional Assessment Tool Used: clinical judgement Functional Limitation: Mobility: Walking and moving around Mobility: Walking and Moving Around Current Status [phone]): At least 80 percent but less than 100 percent impaired, limited or restricted Mobility: Walking and Moving Around Goal Status [phone]): At least 20 percent but less than 40 percent impaired, limited or restricted    Time: 1531-1610 PT Time Calculation (min) (ACUTE ONLY): 39 min   Charges:   PT Evaluation $PT Eval High Complexity: 1 Procedure     PT G Codes:   PT G-Codes **NOT FOR INPATIENT CLASS** Functional Assessment Tool Used: clinical judgement Functional Limitation: Mobility: Walking and moving around Mobility: Walking and Moving Around Current Status [id number]): At least 80 percent but less than 100 percent impaired, limited or restricted  Mobility: Walking and Moving Around Goal Status [phone]): At least 20 percent but less than 40 percent impaired, limited or restricted    Lorriane Shire 12/24/2015, 4:55 PM

## 2015-12-24 NOTE — Progress Notes (Signed)
[name] [id number] DOB: 1961-10-30 DOA: 12/23/2015 PCP: Lenor Derrick, MD  Brief narrative: 55 year old male Insulin-dependent diabetes Hypertension Gout Bilateral arthritis in knees-severe Was seen in the emergency room and 16th of January and given pain medication but has not been mobile  represented to the emergency room 12/23/15 with severe lower extremity pain and inability to mobilize  Past medical history-As per Problem list Chart reviewed as below-   Consultants:  Orthopedics  Procedures:  Bilateral knee injections   Antibiotics:  None   Subjective   Alert pleasant in severe pain Significant hyperalgesia Eating fairly No fever no chills no lower extremity swelling   Objective      Objective: Filed Vitals:   12/23/15 2200 12/23/15 2230 12/23/15 2359 12/24/15 0616  BP: 128/88 126/93 145/92 122/89  Pulse: 79 84 87 80  Temp:   98.3 F (36.8 C) 98.4 F (36.9 C)  TempSrc:   Oral Oral  Resp:  18 18 16   Height:      Weight:      SpO2: 94% 95% 98% 98%    Intake/Output Summary (Last 24 hours) at 12/24/15 0925 Last data filed at 12/24/15 0908  Gross per 24 hour  Intake   1240 ml  Output      0 ml  Net   1240 ml    Exam:  General: EOMI NCAT Cardiovascular: S1-S2 no murmur rub or gallop Respiratory: Clinically clear no added sound Abdomen: Soft nontender nondistended no rebound no guarding Skin significant tenderness to both knees and significant hyperkeratosis of lower extremities as well Neuro power limited by pain after  Data Reviewed: Basic Metabolic Panel:  Recent Labs Lab 12/23/15 2152  NA 140  K 4.2  CL 106  CO2 21*  GLUCOSE 123*  BUN 21*  CREATININE 0.78  CALCIUM 9.7   Liver Function Tests: No results for input(s): AST, ALT, ALKPHOS, BILITOT, PROT, ALBUMIN in the last 168 hours. No results for input(s): LIPASE, AMYLASE in the last 168 hours. No results for input(s): AMMONIA in the last 168  hours. CBC:  Recent Labs Lab 12/23/15 2152  WBC 8.4  NEUTROABS 6.3  HGB 12.0*  HCT 37.7*  MCV 71.9*  PLT 240   Cardiac Enzymes: No results for input(s): CKTOTAL, CKMB, CKMBINDEX, TROPONINI in the last 168 hours. BNP: Invalid input(s): POCBNP CBG:  Recent Labs Lab 12/24/15 0002 12/24/15 0706  GLUCAP 115* 113*    No results found for this or any previous visit (from the past 240 hour(s)).   Studies:              All Imaging reviewed and is as per above notation   Scheduled Meds: . atenolol  50 mg Oral Daily  . docusate sodium  100 mg Oral BID  . enoxaparin (LOVENOX) injection  40 mg Subcutaneous Daily  . insulin aspart  0-15 Units Subcutaneous TID WC  . insulin aspart  4 Units Subcutaneous TID WC  . insulin glargine  25 Units Subcutaneous QHS   Continuous Infusions:    Assessment/Plan:  1. Severe tricompartmental bilateral knee arthritis-appreciate orthopedics input. It appears he was given colchicine and a steroid taper on discharge from the ED recently which has not really helped Monitor patient once injections are done. We will get therapy to see him at around the same time and see if we can make a decision regarding management 2. Insulin-dependent diabetes mellitus-usual dose of 7030 insulin 40 twice a day . He was started on Lantus 25  units. We will keep him on sliding scale and monitor and may need changes to resistant coverage overnight as he will receive Solu-Medrol and injections.  3. Gout-this is clinically quiescent.  We will continue patient's usual home medications colchicine in the next 24 hours if needed 4. Microcytic anemia-monitor. Iron studies pending. 5. Hypertension continue atenolol 50 daily 6. Back pain-improved.  Monitor.  Hold xray for now    Updated fiance at the bedside Inpatient  Verneita Griffes, MD  [facility] Pager [phone] 12/24/2015, 9:25 AM

## 2015-12-25 ENCOUNTER — Encounter (HOSPITAL_COMMUNITY): Payer: Self-pay | Admitting: Orthopedic Surgery

## 2015-12-25 LAB — GLUCOSE, CAPILLARY
GLUCOSE-CAPILLARY: 95 mg/dL (ref 65–99)
GLUCOSE-CAPILLARY: 119 mg/dL — AB (ref 65–99)
Glucose-Capillary: 81 mg/dL (ref 65–99)
Glucose-Capillary: 80 mg/dL (ref 65–99)

## 2015-12-25 LAB — HEMOGLOBIN A1C
HEMOGLOBIN A1C: 6.9 % — AB (ref 4.8–5.6)
Mean Plasma Glucose: 151 mg/dL

## 2015-12-25 MED ORDER — CELECOXIB 200 MG PO CAPS
200.0000 mg | ORAL_CAPSULE | Freq: Every day | ORAL | Status: DC
  Administered 2015-12-25 – 2015-12-27 (×3): 200 mg via ORAL
  Filled 2015-12-25 (×3): qty 1

## 2015-12-25 NOTE — Progress Notes (Signed)
[name] [id number] DOB: 1961-03-24 DOA: 12/23/2015 PCP: Lenor Derrick, MD  Brief narrative: 55 year old male Insulin-dependent diabetes Hypertension Gout Bilateral arthritis in knees-severe Was seen in the emergency room and 16th of January and given pain medication but has not been mobile  represented to the emergency room 12/23/15 with severe lower extremity pain and inability to mobilize  Past medical history-As per Problem list Chart reviewed as below-   Consultants:  Orthopedics  Procedures:  Bilateral knee injections   Antibiotics:  None   Subjective   Severe Le pain still but slightly better on the L>R side No cp No n/v   Objective      Objective: Filed Vitals:   12/24/15 1241 12/24/15 2100 12/25/15 0230 12/25/15 0500  BP: 142/88 82/50 97/51  105/55  Pulse: 73 84  73  Temp: 99 F (37.2 C) 99.7 F (37.6 C)  99 F (37.2 C)  TempSrc: Oral Oral  Oral  Resp: 18 18  20   Height:      Weight:      SpO2: 97% 99%  98%    Intake/Output Summary (Last 24 hours) at 12/25/15 1358 Last data filed at 12/25/15 0500  Gross per 24 hour  Intake      0 ml  Output    300 ml  Net   -300 ml    Exam:  General: EOMI NCAT Cardiovascular: S1-S2 no murmur rub or gallop Respiratory: Clinically clear no added sound Abdomen: Soft nontender nondistended no rebound no guarding Skin significant tenderness to both knees and significant hyperkeratosis of lower extremities as well Neuro power limited by pain after  Data Reviewed: Basic Metabolic Panel:  Recent Labs Lab 12/23/15 2152  NA 140  K 4.2  CL 106  CO2 21*  GLUCOSE 123*  BUN 21*  CREATININE 0.78  CALCIUM 9.7   Liver Function Tests: No results for input(s): AST, ALT, ALKPHOS, BILITOT, PROT, ALBUMIN in the last 168 hours. No results for input(s): LIPASE, AMYLASE in the last 168 hours. No results for input(s): AMMONIA in the last 168 hours. CBC:  Recent Labs Lab 12/23/15 2152  WBC 8.4    NEUTROABS 6.3  HGB 12.0*  HCT 37.7*  MCV 71.9*  PLT 240   Cardiac Enzymes: No results for input(s): CKTOTAL, CKMB, CKMBINDEX, TROPONINI in the last 168 hours. BNP: Invalid input(s): POCBNP CBG:  Recent Labs Lab 12/24/15 1243 12/24/15 1640 12/24/15 2148 12/25/15 0644 12/25/15 1143  GLUCAP 122* 96 105* 81 80    No results found for this or any previous visit (from the past 240 hour(s)).   Studies:              All Imaging reviewed and is as per above notation   Scheduled Meds: . atenolol  50 mg Oral Daily  . celecoxib  200 mg Oral Daily  . docusate sodium  100 mg Oral BID  . enoxaparin (LOVENOX) injection  40 mg Subcutaneous Daily  . insulin aspart  0-15 Units Subcutaneous TID WC  . insulin aspart  4 Units Subcutaneous TID WC  . insulin glargine  25 Units Subcutaneous QHS   Continuous Infusions:    Assessment/Plan:  1. Severe tricompartmental bilateral knee arthritis-appreciate orthopedics input. It appears he was given colchicine and a steroid taper on discharge from the ED recently which has not really helped.  patient still in severe pain.  Primary orthopedic service Dr. Berenice Primas consulted for eval.  Might need SNF but patient not enthusiastic about this plan 2.  Insulin-dependent diabetes mellitus-usual dose of 7030 insulin 40 twice a day . He was started on Lantus 25 units. We will keep him on sliding scale.  Monitor closely-well controlled currently 3. Gout-this is clinically quiescent.  We will continue patient's usual home medications colchicine in the next 24 hours if needed 4. Microcytic anemia-monitor. Iron studies shoe Iron 53, saturation31 so no need for IV iron and slow replacement as OP.  Should consider OP colonoscopy if not done prior 5. Hypertension continue atenolol 50 daily.  Controlled.   Monitor the patient's pressures as drops at night 6. Back pain-improved.  Monitor.  Hold xray for now   No family at the bedside Inpatient  Verneita Griffes,  MD  [facility] Pager [phone] 12/25/2015, 1:58 PM

## 2015-12-25 NOTE — Clinical Social Work Note (Signed)
CSW spoke with patient to discuss SNF placement option, patient states he does not want to go to SNF would like to return back home.  CSW notified Case manager, CSW to sign off, please reconsult if other social work needs arise.  Jones Broom. [place], MSW, [place] 12/25/2015 6:39 PM

## 2015-12-25 NOTE — Progress Notes (Signed)
Physical Therapy Treatment Patient Details Name: [name] MRN: [id number] DOB: [birth year] Today's Date: 12/25/2015    History of Present Illness 55 y.o. 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 Pt with recent hospital visit in January 2017 and pt reports he has not gotten out bed since that time due to severe arthritis and swelling in bilateral knees. PMH significant for HTN, DM type II, HLD, gout, and septic arthritis of R elbow.    PT Comments    Patient limited by pain but willing to participate in PT. Attempted sit to stand with Stedy but unable to stand due to pain. Pt would benefit from  AP and/or later scoot transfer training next session. Continue to progress as tolerated with anticipated d/c to SNF for further skilled PT services.    Follow Up Recommendations  SNF;Supervision/Assistance - 24 hour     Equipment Recommendations  Wheelchair (measurements PT);Rolling walker with 5" wheels;Wheelchair cushion (measurements PT)    Recommendations for Other Services       Precautions / Restrictions Precautions Precautions: Fall Restrictions Weight Bearing Restrictions: No    Mobility  Bed Mobility Overal bed mobility: Needs Assistance;+2 for physical assistance Bed Mobility: Supine to Sit;Sit to Supine     Supine to sit: Max assist;+2 for physical assistance;HOB elevated Sit to supine: Max assist;+2 for physical assistance   General bed mobility comments: assistance to bring bilat LE to EOB and elevate trunk into sitting; assistance to guide trunk and bring bilat LE into bed when returned to supine; use of bed rails with verbal and tactile cues for technqiue  Transfers Overall transfer level: Needs assistance   Transfers: Sit to/from Stand Sit to Stand: +2 physical assistance;Max assist;+2 safety/equipment;From elevated surface         General transfer comment: attempted sit to stand with stedy and max A +2 but unable to  elevate from EOB due to pain in bilat knees  Ambulation/Gait                 Stairs            Wheelchair Mobility    Modified Rankin (Stroke Patients Only)       Balance Overall balance assessment: Needs assistance Sitting-balance support: Feet supported;Single extremity supported Sitting balance-Leahy Scale: Fair                              Cognition Arousal/Alertness: Awake/alert Behavior During Therapy: Anxious Overall Cognitive Status: Impaired/Different from baseline Area of Impairment: Following commands;Problem solving       Following Commands: Follows one step commands inconsistently;Follows one step commands with increased time     Problem Solving: Slow processing;Decreased initiation;Requires verbal cues;Requires tactile cues General Comments: pt repeating to hold on and please ma'am without any movement; pt inconsistently answered questions asked by therapist    Exercises General Exercises - Lower Extremity Ankle Circles/Pumps: AROM;Both;10 reps;Supine Quad Sets: AROM;Both;5 reps;Supine Long Arc Quad: AAROM;Both;5 reps;Seated    General Comments General comments (skin integrity, edema, etc.): pt reported that he has not ambulated in "a long time" but could not remember how long and that he spends most of his time in bed due to not being able to stand      Pertinent Vitals/Pain Pain Assessment: Faces Faces Pain Scale: Hurts worst Pain Location: bilat knees Pain Descriptors / Indicators: Grimacing;Guarding;Crying;Sore Pain Intervention(s): Limited activity within patient's tolerance;Monitored during session;Premedicated before session;Repositioned  Home Living                      Prior Function            PT Goals (current goals can now be found in the care plan section) Acute Rehab PT Goals Patient Stated Goal: to get out of this bed Progress towards PT goals: Progressing toward goals    Frequency  Min  3X/week    PT Plan Current plan remains appropriate    Co-evaluation             End of Session Equipment Utilized During Treatment: Gait belt;Other (comment) Charlaine Dalton) Activity Tolerance: Patient limited by pain Patient left: in bed;with call bell/phone within reach;with family/visitor present     Time: [id number] PT Time Calculation (min) (ACUTE ONLY): 35 min  Charges:  $Therapeutic Activity: 23-37 mins                    G Codes:      Salina April, PTA Pager: [phone]   12/25/2015, 4:39 PM

## 2015-12-25 NOTE — Progress Notes (Signed)
[facility] Progress Note  Subjective  Looks better this am States some relief in B knees after injections. R hurts more than L still  Worked with PT yesterday, only got to the EOB   ROS As above  Objective   BP 105/55 mmHg  Pulse 73  Temp(Src) 99 F (37.2 C) (Oral)  Resp 20  Ht 5\' 5"  (1.651 m)  Wt 107.956 kg (238 lb)  BMI 39.61 kg/m2  SpO2 98%  Intake/Output      02/05 0701 - 02/06 0700 02/06 0701 - 02/07 0700   P.O. 240    I.V. (mL/kg)     Total Intake(mL/kg) 240 (2.2)    Urine (mL/kg/hr) 625 (0.2)    Total Output 625     Net -385            Labs CBG (last 3)   Recent Labs  12/24/15 1640 12/24/15 2148 12/25/15 0644  GLUCAP 96 105* 81      Exam  Gen: resting comfortably in bed, NAD, remains afebrile  Ext:       Bilateral lower extremities  Really unable to range knees at all   Feels like there is a mechanical block  Significant muscle wasting of quads B   Scaly lesions B lower legs  No signs of infection distally   exts warm   + DP pulses    Assessment and Plan   POD/HD#: 33  55 year old black male uncontrollable bilateral knee pain with end-stage bilateral knee degenerative joint disease  1. End-stage bilateral knee DJD with uncontrollable pain and severe limitations on ADLs             WBAT B  ROM as tolerated B   Ice/heat PRN  Continue with therapies    Suspect pt has had little to no ROM at his knees for quite some time given amount of muscle wasting to his quads   Did contact Gaspar Skeeters, PA-C (Dr. Berenice Primas), they will review pts xrays and possibly eval in hospital   Continue to work on placement as therapies recommending SNF     2. Pain management:             Continue with current regimen             will add Celebrex to see if this helps                           3. Medical issues                diabetes                         sugars stable     4. DVT/PE prophylaxis:              defer to medicine  service  5. Metabolic Bone Disease:              patient does have a history of vitamin D deficiency based off of labs in 2014             new vitamin D pending   6. Activity:              PT and OT              Weight-bear as tolerated bilaterally  7. Dispo:              PT  and OT   Further ortho evals              will contact Dr. Berenice Primas to see if we can expedite an office visit    Jari Pigg, PA-C [facility] [phone] (O) 12/25/2015 9:38 AM

## 2015-12-26 LAB — GLUCOSE, CAPILLARY
GLUCOSE-CAPILLARY: 158 mg/dL — AB (ref 65–99)
GLUCOSE-CAPILLARY: 200 mg/dL — AB (ref 65–99)
Glucose-Capillary: 77 mg/dL (ref 65–99)
Glucose-Capillary: 95 mg/dL (ref 65–99)

## 2015-12-26 LAB — COMPREHENSIVE METABOLIC PANEL
ALBUMIN: 2 g/dL — AB (ref 3.5–5.0)
ALT: 42 U/L (ref 17–63)
ANION GAP: 11 (ref 5–15)
AST: 28 U/L (ref 15–41)
Alkaline Phosphatase: 104 U/L (ref 38–126)
BILIRUBIN TOTAL: 1 mg/dL (ref 0.3–1.2)
BUN: 24 mg/dL — AB (ref 6–20)
CO2: 24 mmol/L (ref 22–32)
Calcium: 9.1 mg/dL (ref 8.9–10.3)
Chloride: 102 mmol/L (ref 101–111)
Creatinine, Ser: 0.81 mg/dL (ref 0.61–1.24)
GFR calc Af Amer: >60 mL/min (ref >60)
GFR calc non Af Amer: >60 mL/min (ref >60)
GLUCOSE: 74 mg/dL (ref 65–99)
POTASSIUM: 3.9 mmol/L (ref 3.5–5.1)
SODIUM: 137 mmol/L (ref 135–145)
TOTAL PROTEIN: 8.3 g/dL — AB (ref 6.5–8.1)

## 2015-12-26 LAB — VITAMIN D 25 HYDROXY (VIT D DEFICIENCY, FRACTURES): VIT D 25 HYDROXY: 14.2 ng/mL — AB (ref 30.0–100.0)

## 2015-12-26 MED ORDER — DEXAMETHASONE SODIUM PHOSPHATE 10 MG/ML IJ SOLN
10.0000 mg | Freq: Four times a day (QID) | INTRAMUSCULAR | Status: AC
  Administered 2015-12-26 – 2015-12-27 (×3): 10 mg via INTRAVENOUS
  Filled 2015-12-26 (×4): qty 1

## 2015-12-26 MED ORDER — BUPIVACAINE HCL (PF) 0.5 % IJ SOLN
20.0000 mL | Freq: Once | INTRAMUSCULAR | Status: AC
  Administered 2015-12-26: 20 mL
  Filled 2015-12-26: qty 20

## 2015-12-26 MED ORDER — DEXAMETHASONE SODIUM PHOSPHATE 10 MG/ML IJ SOLN: 10.0000 mg | Freq: Once | INTRAMUSCULAR | Status: DC

## 2015-12-26 MED ORDER — METHYLPREDNISOLONE ACETATE 80 MG/ML IJ SUSP
160.0000 mg | Freq: Once | INTRAMUSCULAR | Status: AC
  Administered 2015-12-26: 160 mg via INTRA_ARTICULAR
  Filled 2015-12-26: qty 2

## 2015-12-26 MED ORDER — VITAMIN D (ERGOCALCIFEROL) 1.25 MG (50000 UNIT) PO CAPS
50000.0000 [IU] | ORAL_CAPSULE | ORAL | Status: DC
  Administered 2015-12-26: 50000 [IU] via ORAL
  Filled 2015-12-26: qty 1

## 2015-12-26 NOTE — Progress Notes (Signed)
Occupational Therapy Treatment Patient Details Name: [name] MRN: [id number] DOB: 07-Feb-1961 Today's Date: 12/26/2015    History of present illness 55 y.o. 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 Pt with recent hospital visit in January 2017 and pt reports he has not gotten out bed since that time due to severe arthritis and swelling in bilateral knees. PMH significant for HTN, DM type II, HLD, gout, and septic arthritis of R elbow.   OT comments  Pt making gradual progress towards occupational therapy goals and is still significantly limited by bilateral knee pain. Attempted x3 sit-stand transfers with max +2 assist. Used 2 person hand-held assist and bed pad underneath pt to facilitate hip extension with minimal success and pt was unable to achieve full standing position. Pt cannot tolerate weight-bearing on bilateral knees to allow for further transfer practice. Will continue to follow acutely.   Follow Up Recommendations  SNF;Supervision/Assistance - 24 hour    Equipment Recommendations  3 in 1 bedside comode;Other (comment);Wheelchair (measurements OT);Wheelchair cushion (measurements OT) (RW-2 wheeled)    Recommendations for Other Services      Precautions / Restrictions Precautions Precautions: Fall Restrictions Weight Bearing Restrictions: No       Mobility Bed Mobility               General bed mobility comments: Pt sitting EOB after receiving bilateral knee injections  Transfers Overall transfer level: Needs assistance Equipment used: 2 person hand held assist Transfers: Sit to/from Stand Sit to Stand: Max assist;+2 physical assistance         General transfer comment: Attempted sit-stand x3 without success using 2 person hand-held assist and bed pad underneath pt's pelvis to facilitate hip extension. Pt able to lift buttocks off bed approximately 3-4 inches which is an improvement from previous PT and OT sessions. Pt  unable to tolerate any more attempts at weight-bearing on bilateral LE or pivotal steps to chair    Balance Overall balance assessment: Needs assistance Sitting-balance support: Bilateral upper extremity supported;Feet supported Sitting balance-Leahy Scale: Fair Sitting balance - Comments: Due to pain                           ADL Overall ADL's : Needs assistance/impaired                 Upper Body Dressing : Set up;Sitting   Lower Body Dressing: Moderate assistance;Sitting/lateral leans Lower Body Dressing Details (indicate cue type and reason): Unable to reach bilateral feet and cannot tolerate crossing ankle-over-knee             Functional mobility during ADLs: Maximal assistance;+2 for physical assistance General ADL Comments: Focus of session on attempting transfers - pt received injection 10 minutes before session      Vision                     Perception     Praxis      Cognition   Behavior During Therapy: Anxious Overall Cognitive Status: No family/caregiver present to determine baseline cognitive functioning Area of Impairment: Following commands;Problem solving        Following Commands: Follows one step commands inconsistently;Follows one step commands with increased time     Problem Solving: Slow processing;Decreased initiation;Requires verbal cues;Requires tactile cues      Extremity/Trunk Assessment               Exercises  Shoulder Instructions       General Comments      Pertinent Vitals/ Pain       Pain Assessment: 0-10 Pain Score: 9  Pain Location: bilateral knees Pain Descriptors / Indicators: Aching;Grimacing;Crying Pain Intervention(s): Limited activity within patient's tolerance;Monitored during session;Premedicated before session;Repositioned  Home Living                                          Prior Functioning/Environment              Frequency       Progress  Toward Goals  OT Goals(current goals can now be found in the care plan section)  Progress towards OT goals: Progressing toward goals  Acute Rehab OT Goals Patient Stated Goal: to get out of this bed OT Goal Formulation: With patient Time For Goal Achievement: 01/07/16 Potential to Achieve Goals: Fair ADL Goals Pt Will Perform Lower Body Bathing: with min guard assist;sitting/lateral leans Pt Will Perform Lower Body Dressing: with min guard assist;sitting/lateral leans Pt Will Transfer to Toilet: with mod assist;stand pivot transfer;bedside commode Pt Will Perform Toileting - Clothing Manipulation and hygiene: with min assist;sitting/lateral leans  Plan Discharge plan remains appropriate    Co-evaluation                 End of Session Equipment Utilized During Treatment: Gait belt;Rolling walker   Activity Tolerance Patient limited by pain   Patient Left in bed;with call bell/phone within reach;with bed alarm set (sitting EOB)   Nurse Communication Mobility status        Time: [id number] OT Time Calculation (min): 13 min  Charges: OT General Charges $OT Visit: 1 Procedure OT Treatments $Self Care/Home Management : 8-22 mins  Redmond Baseman. OTR/L Pager: [phone] 12/26/2015, 2:39 PM

## 2015-12-26 NOTE — Consult Note (Signed)
Reason for Consult:Bilateral intractable knee pain Referring Physician: Dr. Legrand Como handy and hospitalists  [name] is an 55 y.o. male.  HPI: this is a 55 year old male unknown to our service who has a long history of bilateral knee pain.  Recently he began having severely worsening bilateral knee pain.  He was admitted to the hospital for severe bilateral knee pain.  He underwent cortisone injection which has helped some that he continues to have significant bilateral knee pain.  Her trauma service admitted him and began initial treatment and we're consulted for further evaluation and possible treatment options.  Past Medical History  Diagnosis Date  . Hypertension   . Type II diabetes mellitus ([place])   . Hyperlipidemia   . Hx of gout   . Gouty bursitis, elbow  10/18/2015    Past Surgical History  Procedure Laterality Date  . No past surgeries      Family History  Problem Relation Age of Onset  . Diabetes Mother   . Diabetes Father     Social History:  reports that he has never smoked. He has never used smokeless tobacco. He reports that he does not drink alcohol or use illicit drugs.  Allergies: No Known Allergies  Medications: I have reviewed the patient's current medications.  Results for orders placed or performed during the hospital encounter of 12/23/15 (from the past 48 hour(s))  Glucose, capillary     Status: Abnormal   Collection Time: 12/24/15 12:43 PM  Result Value Ref Range   Glucose-Capillary 122 (H) 65 - 99 mg/dL  Glucose, capillary     Status: None   Collection Time: 12/24/15  4:40 PM  Result Value Ref Range   Glucose-Capillary 96 65 - 99 mg/dL  Glucose, capillary     Status: Abnormal   Collection Time: 12/24/15  9:48 PM  Result Value Ref Range   Glucose-Capillary 105 (H) 65 - 99 mg/dL  VITAMIN D 25 Hydroxy (Vit-D Deficiency, Fractures)     Status: Abnormal   Collection Time: 12/25/15  2:53 AM  Result Value Ref Range   Vit D, 25-Hydroxy 14.2 (L)  30.0 - 100.0 ng/mL    Comment: (NOTE) Vitamin D deficiency has been defined by the [place] practice guideline as a level of serum 25-OH vitamin D less than 20 ng/mL (1,2). The Endocrine Society went on to further define vitamin D insufficiency as a level between 21 and 29 ng/mL (2). 1. IOM (Institute of Medicine). 2010. Dietary reference   intakes for calcium and D. [place]: The   [employer]. 2. Holick MF, Binkley Vazquez, Bischoff-Ferrari HA, et al.   Evaluation, treatment, and prevention of vitamin D   deficiency: an Endocrine Society clinical practice   guideline. JCEM. 2011 Jul; 96(7):1911-30. Performed At: [facility] [place], [place] [id number] Lindon Romp MD [id number]   Glucose, capillary     Status: None   Collection Time: 12/25/15  6:44 AM  Result Value Ref Range   Glucose-Capillary 81 65 - 99 mg/dL  Glucose, capillary     Status: None   Collection Time: 12/25/15 11:43 AM  Result Value Ref Range   Glucose-Capillary 80 65 - 99 mg/dL  Glucose, capillary     Status: None   Collection Time: 12/25/15  4:05 PM  Result Value Ref Range   Glucose-Capillary 95 65 - 99 mg/dL  Glucose, capillary     Status: Abnormal   Collection Time: 12/25/15  9:27 PM  Result Value Ref Range   Glucose-Capillary 119 (H) 65 - 99 mg/dL  Comprehensive metabolic panel     Status: Abnormal   Collection Time: 12/26/15  2:45 AM  Result Value Ref Range   Sodium 137 135 - 145 mmol/L   Potassium 3.9 3.5 - 5.1 mmol/L   Chloride 102 101 - 111 mmol/L   CO2 24 22 - 32 mmol/L   Glucose, Bld 74 65 - 99 mg/dL   BUN 24 (H) 6 - 20 mg/dL   Creatinine, Ser 0.81 0.61 - 1.24 mg/dL   Calcium 9.1 8.9 - 10.3 mg/dL   Total Protein 8.3 (H) 6.5 - 8.1 g/dL   Albumin 2.0 (L) 3.5 - 5.0 g/dL   AST 28 15 - 41 U/L   ALT 42 17 - 63 U/L   Alkaline Phosphatase 104 38 - 126 U/L   Total Bilirubin 1.0 0.3 - 1.2 mg/dL   GFR calc non Af Amer  >60 >60 mL/min   GFR calc Af Amer >60 >60 mL/min    Comment: (NOTE) The eGFR has been calculated using the CKD EPI equation. This calculation has not been validated in all clinical situations. eGFR's persistently <60 mL/min signify possible Chronic Kidney Disease.    Anion gap 11 5 - 15  Glucose, capillary     Status: None   Collection Time: 12/26/15  6:57 AM  Result Value Ref Range   Glucose-Capillary 77 65 - 99 mg/dL    No results found.  ROS  ROS: I have reviewed the patient's review of systems thoroughly and there are no positive responses as relates to the HPI. Blood pressure 136/86, pulse 72, temperature 98.5 F (36.9 C), temperature source Oral, resp. rate 20, height '5\' 5"'  (1.651 m), weight 107.956 kg (238 lb), SpO2 96 %. Physical Exam Well-developed well-nourished patient in no acute distress. Alert and oriented x3 HEENT:within normal limits Cardiac: Regular rate and rhythm Pulmonary: Lungs clear to auscultation Abdomen: Soft and nontender.  Normal active bowel sounds  Musculoskeletal: (bilateral lower extremity shows severe plaque psoriasis from just below the knee down.he has pain with all range of motion.  He has no instability.  There is mild warmth.  Is no erythema.) Assessment/Plan: 55 yo male with bilateral intractable knee pain in the setting of prolonged off and on knee pain.  He has had injection therapy which has begun to help some.//There is absolutely no indication for emergent knee replacement.  Ultimately the patient may need knee replacement but certainly is not a decision we would make at this point.  Severe knee pain such as this comes from some inflammatory situation which in his case could be related to his psoriasis or could be related to gout or is just some exacerbation of his long-standing mechanical problems.  Injection therapy has begun to be helpful and we will proceed with a larger injection today.  I would also start him on a steroid Dosepak and I  would follow him up as an outpatient.  He will need supportive pain medication until his outpatient appointment.  I also feel that rheumatology consult may be appropriate.  We will be happy to follow him as an outpatient until his psoriasis is under better control he is not a candidate for joint replacement at this time anyway.  We will be happy to follow him as an outpatient.  Aurelio Mccamy L 12/26/2015, 7:49 AM

## 2015-12-26 NOTE — Progress Notes (Addendum)
Subjective: Since still complains of incapacitating bilateral knee pain with inability to bend his knees are weight-bear on them. He does have a past history of gouty arthritis with positive aspiration for monosodium urate crystals. He got some improvement with a steroid injection in both knees a couple of days ago by Ainsley Spinner PA-C. He continues to have significant knee pain in both knees of acute onset.  Objective: Vital signs in last 24 hours: Temp:  [98.4 F (36.9 C)-98.6 F (37 C)] 98.5 F (36.9 C) (02/07 0655) Pulse Rate:  [69-98] 72 (02/07 0655) Resp:  [18-20] 20 (02/07 0655) BP: (126-136)/(78-86) 136/86 mmHg (02/07 0655) SpO2:  [96 %-98 %] 96 % (02/07 0655)  Intake/Output from previous day: 02/06 0701 - 02/07 0700 In: 240 [P.O.:240] Out: 1175 [Urine:1175] Intake/Output this shift: Total I/O In: 240 [P.O.:240] Out: -    Recent Labs  12/23/15 2152  HGB 12.0*    Recent Labs  12/23/15 2152  WBC 8.4  RBC 5.24  HCT 37.7*  PLT 240    Recent Labs  12/23/15 2152 12/26/15 0245  NA 140 137  K 4.2 3.9  CL 106 102  CO2 21* 24  BUN 21* 24*  CREATININE 0.78 0.81  GLUCOSE 123* 74  CALCIUM 9.7 9.1   No results for input(s): LABPT, INR in the last 72 hours. Bilateral knee exams: He has exquisite tenderness of both knees. His range of motion is only -30 to about 45 of flexion. He does not have effusions. He has no ligamentous laxity. Both calves are soft and nontender. There is no redness on either knee. He has full range of motion of both hips without pain.   Assessment/Plan: Incapacitating bilateral knee pain with swelling. osteoarthritis bilateral knees. He was arthritic radiographically, but they were not weightbearing x-rays. Possible gout of knees as etiology. Plan: Today I attempted to aspirate the right knee but was unable to get any fluid out. I then under sterile conditions injected both knees with 2 mL 80 mg per mL Depo-Medrol mixture with 8 mL of 0.5%  plain Marcaine. After the injections he was able to move his knees a little better. I will order 3 doses of IV Decadron 10 mg every 6 hours.Marland Kitchen He will have continued diabetes management. If his knee pain is not improved tomorrow than we should consider getting MRI scans of both knees. We will follow the patient.   Joeline Freer G 12/26/2015, 1:33 PM

## 2015-12-26 NOTE — Progress Notes (Addendum)
[name] [id number] DOB: 1961-06-26 DOA: 12/23/2015 PCP: Lenor Derrick, MD  Brief narrative: 55 year old male Insulin-dependent diabetes Hypertension Gout Bilateral arthritis in knees-severe Probable psoriasis Was seen in the emergency room and 16th of January and given pain medication but has not been mobile  represented to the emergency room 12/23/15 with severe lower extremity pain and inability to mobilize  Past medical history-As per Problem list Chart reviewed as below-   Consultants:  Orthopedics  Procedures:  Bilateral knee injections   Antibiotics:  None   Subjective   Severe Le pain still but slightly better on the L>R side Was only able to stand at the bedside Tells me is scheduled for another injection later today by ortho No cp n/v/weakness   Objective      Objective: Filed Vitals:   12/25/15 0500 12/25/15 1439 12/25/15 2100 12/26/15 0655  BP: 105/55 126/78 134/85 136/86  Pulse: 73 69 98 72  Temp: 99 F (37.2 C) 98.4 F (36.9 C) 98.6 F (37 C) 98.5 F (36.9 C)  TempSrc: Oral Oral Oral Oral  Resp: 20 18 18 20   Height:      Weight:      SpO2: 98% 98% 98% 96%    Intake/Output Summary (Last 24 hours) at 12/26/15 1220 Last data filed at 12/26/15 0730  Gross per 24 hour  Intake    480 ml  Output   1175 ml  Net   -695 ml    Exam:  General: EOMI NCAT Cardiovascular: S1-S2 no murmur rub or gallop Respiratory: Clinically clear no added sound Abdomen: Soft nontender nondistended no rebound no guarding Skin significant tenderness to both knees and significant hyperkeratosis of lower extremities as well Neuro power limited by pain after  Data Reviewed: Basic Metabolic Panel:  Recent Labs Lab 12/23/15 2152 12/26/15 0245  NA 140 137  K 4.2 3.9  CL 106 102  CO2 21* 24  GLUCOSE 123* 74  BUN 21* 24*  CREATININE 0.78 0.81  CALCIUM 9.7 9.1   Liver Function Tests:  Recent Labs Lab 12/26/15 0245  AST 28  ALT 42    ALKPHOS 104  BILITOT 1.0  PROT 8.3*  ALBUMIN 2.0*   No results for input(s): LIPASE, AMYLASE in the last 168 hours. No results for input(s): AMMONIA in the last 168 hours. CBC:  Recent Labs Lab 12/23/15 2152  WBC 8.4  NEUTROABS 6.3  HGB 12.0*  HCT 37.7*  MCV 71.9*  PLT 240   Cardiac Enzymes: No results for input(s): CKTOTAL, CKMB, CKMBINDEX, TROPONINI in the last 168 hours. BNP: Invalid input(s): POCBNP CBG:  Recent Labs Lab 12/25/15 0644 12/25/15 1143 12/25/15 1605 12/25/15 2127 12/26/15 0657  GLUCAP 81 80 95 119* 77    No results found for this or any previous visit (from the past 240 hour(s)).   Studies:              All Imaging reviewed and is as per above notation   Scheduled Meds: . atenolol  50 mg Oral Daily  . bupivacaine  20 mL Infiltration Once  . celecoxib  200 mg Oral Daily  . docusate sodium  100 mg Oral BID  . enoxaparin (LOVENOX) injection  40 mg Subcutaneous Daily  . insulin aspart  0-15 Units Subcutaneous TID WC  . insulin aspart  4 Units Subcutaneous TID WC  . insulin glargine  25 Units Subcutaneous QHS  . methylPREDNISolone acetate  160 mg Intra-articular Once  . methylPREDNISolone acetate  160 mg  Intra-articular Once  . Vitamin D (Ergocalciferol)  50,000 Units Oral Q7 days   Continuous Infusions:    Assessment/Plan:  1. Severe tricompartmental bilateral knee arthritis-appreciate orthopedics inputpatient still in severe pain.  Primary orthopedic service Dr. Berenice Primas consulted for eval-will get Knee injections today  Might need SNF but patient not enthusiastic about this plan-I had a logn chat with him about options on 12/26/15 2. Insulin-dependent diabetes mellitus-usual dose of 7030 insulin 40 twice a day . He was started on Lantus 25 units. We will keep him on sliding scale. Blood sugars are well controlled int he 70 range today 3. Gout-this is clinically quiescent.  We will continue patient's usual home medications colchicine in the next  24 hours if needed. 4. Possible psoriasis-there is no Skin or Rheum service that comes to the Hospital.  Patient will need OP follow up. 5. Microcytic anemia-monitor. Iron studies shoe Iron 53, saturation31 so no need for IV iron and slow replacement as OP.  Should consider OP colonoscopy if not done prior 6. Hypertension continue atenolol 50 daily.  Controlled.   Monitor the patient's pressures as drops at night 7. Back pain-improved.  Monitor.  Hold xray for now 8. Vit D def.  placed on once weekly replacement of Vit D 50,000 units   No family at the bedside Inpatient  Verneita Griffes, MD  [facility] Pager [phone] 12/26/2015, 12:20 PM

## 2015-12-27 ENCOUNTER — Observation Stay (HOSPITAL_COMMUNITY): Payer: Self-pay

## 2015-12-27 LAB — COMPREHENSIVE METABOLIC PANEL
ALBUMIN: 2.1 g/dL — AB (ref 3.5–5.0)
ALT: 43 U/L (ref 17–63)
ANION GAP: 15 (ref 5–15)
AST: 26 U/L (ref 15–41)
Alkaline Phosphatase: 104 U/L (ref 38–126)
BILIRUBIN TOTAL: 0.7 mg/dL (ref 0.3–1.2)
BUN: 23 mg/dL — AB (ref 6–20)
CHLORIDE: 102 mmol/L (ref 101–111)
CO2: 20 mmol/L — AB (ref 22–32)
Calcium: 9.6 mg/dL (ref 8.9–10.3)
Creatinine, Ser: 0.83 mg/dL (ref 0.61–1.24)
GFR calc Af Amer: >60 mL/min (ref >60)
GFR calc non Af Amer: >60 mL/min (ref >60)
GLUCOSE: 174 mg/dL — AB (ref 65–99)
POTASSIUM: 3.7 mmol/L (ref 3.5–5.1)
SODIUM: 137 mmol/L (ref 135–145)
Total Protein: 8.4 g/dL — ABNORMAL HIGH (ref 6.5–8.1)

## 2015-12-27 LAB — SEDIMENTATION RATE: SED RATE: 115 mm/h — AB (ref 0–16)

## 2015-12-27 LAB — CBC WITH DIFFERENTIAL/PLATELET
BASOS ABS: 0 10*3/uL (ref 0.0–0.1)
BASOS PCT: 0 %
EOS ABS: 0 10*3/uL (ref 0.0–0.7)
Eosinophils Relative: 0 %
HEMATOCRIT: 33.7 % — AB (ref 39.0–52.0)
Hemoglobin: 10.6 g/dL — ABNORMAL LOW (ref 13.0–17.0)
Lymphocytes Relative: 15 %
Lymphs Abs: 1 10*3/uL (ref 0.7–4.0)
MCH: 22.1 pg — ABNORMAL LOW (ref 26.0–34.0)
MCHC: 31.5 g/dL (ref 30.0–36.0)
MCV: 70.4 fL — ABNORMAL LOW (ref 78.0–100.0)
MONO ABS: 0.2 10*3/uL (ref 0.1–1.0)
Monocytes Relative: 3 %
NEUTROS ABS: 5.9 10*3/uL (ref 1.7–7.7)
Neutrophils Relative %: 82 %
PLATELETS: 284 10*3/uL (ref 150–400)
RBC: 4.79 MIL/uL (ref 4.22–5.81)
RDW: 15.8 % — AB (ref 11.5–15.5)
WBC: 7.2 10*3/uL (ref 4.0–10.5)

## 2015-12-27 LAB — GLUCOSE, CAPILLARY
Glucose-Capillary: 155 mg/dL — ABNORMAL HIGH (ref 65–99)
Glucose-Capillary: 167 mg/dL — ABNORMAL HIGH (ref 65–99)
Glucose-Capillary: 143 mg/dL — ABNORMAL HIGH (ref 65–99)
Glucose-Capillary: 114 mg/dL — ABNORMAL HIGH (ref 65–99)

## 2015-12-27 LAB — C-REACTIVE PROTEIN: CRP: 14.1 mg/dL — AB (ref <1.0)

## 2015-12-27 MED ORDER — LORAZEPAM 2 MG/ML IJ SOLN: 1.0000 mg | Freq: Four times a day (QID) | INTRAMUSCULAR | Status: DC | PRN

## 2015-12-27 MED ORDER — COLCHICINE 0.6 MG PO TABS
0.6000 mg | ORAL_TABLET | Freq: Every day | ORAL | Status: DC
  Administered 2015-12-27 – 2016-01-01 (×5): 0.6 mg via ORAL
  Filled 2015-12-27 (×5): qty 1

## 2015-12-27 NOTE — Progress Notes (Signed)
Physical Therapy Treatment Patient Details Name: [name] MRN: [id number] DOB: Nov 17, 1961 Today's Date: 12/27/2015    History of Present Illness 55 y.o. 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 Pt with recent hospital visit in January 2017 and pt reports he has not gotten out bed since that time due to severe arthritis and swelling in bilateral knees. PMH significant for HTN, DM type II, HLD, gout, and septic arthritis of R elbow.    PT Comments    Patient with improved bed mobility this session. Current plan remains appropriate.   Follow Up Recommendations  SNF;Supervision/Assistance - 24 hour     Equipment Recommendations  Wheelchair (measurements PT);Rolling walker with 5" wheels;Wheelchair cushion (measurements PT)    Recommendations for Other Services       Precautions / Restrictions Precautions Precautions: Fall Restrictions Weight Bearing Restrictions: No    Mobility  Bed Mobility Overal bed mobility: Needs Assistance;+2 for physical assistance       Supine to sit: Min assist;Mod assist;+2 for physical assistance Sit to supine: Mod assist   General bed mobility comments: assist with bilat LE into bed and vc for technique; pt able to scoot in bed without assist with bilat UE and bed rails  Transfers Overall transfer level: Needs assistance Equipment used: Rolling walker (2 wheeled) Transfers: Sit to/from [employer] Transfers Sit to Stand: Max assist;+2 physical assistance Stand pivot transfers: Max assist;+2 physical assistance       General transfer comment: assistance to power up into standing and maintain balance throughout transfers; bilat knees flexed throughout; max vc for technique/sequencing and posture  Ambulation/Gait             General Gait Details: unable    Stairs            Wheelchair Mobility    Modified Rankin (Stroke Patients Only)       Balance Overall balance  assessment: Needs assistance Sitting-balance support: No upper extremity supported;Feet supported Sitting balance-Leahy Scale: Fair     Standing balance support: Bilateral upper extremity supported Standing balance-Leahy Scale: Zero                      Cognition Arousal/Alertness: Awake/alert Behavior During Therapy: Anxious Overall Cognitive Status: No family/caregiver present to determine baseline cognitive functioning Area of Impairment: Following commands;Problem solving       Following Commands: Follows one step commands inconsistently;Follows one step commands with increased time     Problem Solving: Slow processing;Decreased initiation;Requires verbal cues;Requires tactile cues      Exercises General Exercises - Lower Extremity Ankle Circles/Pumps: AROM;Both;10 reps;Supine Quad Sets: AROM;Both;10 reps;Seated Long Arc Quad: AAROM;Both;10 reps;Seated    General Comments        Pertinent Vitals/Pain Pain Assessment: Faces Pain Score: 7  Faces Pain Scale: Hurts little more Pain Location: bilat knees Pain Descriptors / Indicators: Sore Pain Intervention(s): Limited activity within patient's tolerance;Monitored during session;Repositioned    Home Living                      Prior Function            PT Goals (current goals can now be found in the care plan section) Acute Rehab PT Goals Patient Stated Goal: to get out of this bed Progress towards PT goals: Progressing toward goals    Frequency  Min 3X/week    PT Plan Current plan remains appropriate    Co-evaluation  End of Session Equipment Utilized During Treatment: Gait belt Activity Tolerance: Patient tolerated treatment well Patient left: in chair;with call bell/phone within reach;with family/visitor present     Time: [id number] PT Time Calculation (min) (ACUTE ONLY): 17 min  Charges:  $Therapeutic Activity: 8-22 mins                    G Codes:       Salina April, PTA Pager: [phone]   12/27/2015, 4:32 PM

## 2015-12-27 NOTE — Progress Notes (Signed)
Subjective: Still c/o bilat knee pain and inability to walk. Not OOB yet. Pain " a little better" after repeat steroid injections in knees and 3 doses of IV steroids.  Objective: Vital signs in last 24 hours: Temp:  [98 F (36.7 C)-99.7 F (37.6 C)] 98 F (36.7 C) (02/08 0533) Pulse Rate:  [76-91] 76 (02/08 0533) Resp:  [16-18] 16 (02/08 0533) BP: (127-148)/(88-92) 148/92 mmHg (02/08 0533) SpO2:  [98 %-99 %] 98 % (02/08 0533)  Intake/Output from previous day: 02/07 0701 - 02/08 0700 In: 600 [P.O.:600] Out: 1300 [Urine:1300] Intake/Output this shift: Total I/O In: 120 [P.O.:120] Out: -   No results for input(s): HGB in the last 72 hours. No results for input(s): WBC, RBC, HCT, PLT in the last 72 hours.  Recent Labs  12/26/15 0245  NA 137  K 3.9  CL 102  CO2 24  BUN 24*  CREATININE 0.81  GLUCOSE 74  CALCIUM 9.1   No results for input(s): LABPT, INR in the last 72 hours. Bilat knees: Rom Bilat -15 degrees to about 30 degrees of flexion. No redness. Calves soft. C/o pain with any attempts at ROM.   Assessment/Plan: Puzzling bilat knee pain with DJD ? Related to psoriasis.. Doubt septic knees Plan: Ordered MRIs of both knees with contrast Labs per Ainsley Spinner PA-C Will follow. Pt seen by Dr Berenice Primas today.   Ashara Lounsbury G 12/27/2015, 11:26 AM

## 2015-12-27 NOTE — Progress Notes (Signed)
Nutrition Brief Note  Patient identified on the Malnutrition Screening Tool (MST) Report  Wt Readings from Last 15 Encounters:  12/23/15 238 lb (107.956 kg)  10/18/15 238 lb 8 oz (108.183 kg)  10/13/13 265 lb 9.6 oz (120.475 kg)    Body mass index is 39.61 kg/(m^2). Patient meets criteria for class II obesity based on current BMI. Weight has been stable.   Current diet order is carbohydrate modifed, patient is consuming mostly 75-100% of meals at this time. Appetite is fine currently and PTA. Labs and medications reviewed.   No nutrition interventions warranted at this time. If nutrition issues arise, please consult RD.   Corrin Parker, MS, RD, LDN Pager # [phone] After hours/ weekend pager # [phone]

## 2015-12-27 NOTE — Progress Notes (Signed)
[name] [id number] DOB: [birth year] DOA: 12/23/2015 PCP: Lenor Derrick, MD  Brief narrative: 55 year old male Insulin-dependent diabetes Hypertension Gout Bilateral arthritis in knees-severe Probable psoriasis Was seen in the emergency room and 16th of January and given pain medication but has not been mobile  represented to the emergency room 12/23/15 with severe lower extremity pain and inability to mobilize  Orthopedics consulted and initally did injection to Knees 2/5 He was seen again and higher dose steroids injected on 2/7  Currently working up for further Auaotimmune cause sof arhtritis  Past medical history-As per Problem list Chart reviewed as below-   Consultants:  Orthopedics  Procedures:  Bilateral knee injections   Antibiotics:  None   Subjective   3/10 pain LLE 7/10 LLE Seems very worried about outcome    Objective      Objective: Filed Vitals:   12/26/15 0655 12/26/15 1502 12/26/15 2106 12/27/15 0533  BP: 136/86 127/88 131/90 148/92  Pulse: 72 78 91 76  Temp: 98.5 F (36.9 C) 98.1 F (36.7 C) 99.7 F (37.6 C) 98 F (36.7 C)  TempSrc: Oral Oral Oral Oral  Resp: 20 18 18 16   Height:      Weight:      SpO2: 96% 98% 99% 98%    Intake/Output Summary (Last 24 hours) at 12/27/15 1145 Last data filed at 12/27/15 0830  Gross per 24 hour  Intake    480 ml  Output   1300 ml  Net   -820 ml    Exam:  General: EOMI NCAT Cardiovascular: S1-S2 no murmur rub or gallop Respiratory: Clinically clear no added sound Abdomen: Soft nontender nondistended no rebound no guarding Skin significant tenderness to both knees and significant hyperkeratosis of lower extremities as well Neuro power limited by pain after  Data Reviewed: Basic Metabolic Panel:  Recent Labs Lab 12/23/15 2152 12/26/15 0245  NA 140 137  K 4.2 3.9  CL 106 102  CO2 21* 24  GLUCOSE 123* 74  BUN 21* 24*  CREATININE 0.78 0.81  CALCIUM 9.7 9.1   Liver  Function Tests:  Recent Labs Lab 12/26/15 0245  AST 28  ALT 42  ALKPHOS 104  BILITOT 1.0  PROT 8.3*  ALBUMIN 2.0*   No results for input(s): LIPASE, AMYLASE in the last 168 hours. No results for input(s): AMMONIA in the last 168 hours. CBC:  Recent Labs Lab 12/23/15 2152  WBC 8.4  NEUTROABS 6.3  HGB 12.0*  HCT 37.7*  MCV 71.9*  PLT 240   Cardiac Enzymes: No results for input(s): CKTOTAL, CKMB, CKMBINDEX, TROPONINI in the last 168 hours. BNP: Invalid input(s): POCBNP CBG:  Recent Labs Lab 12/26/15 0657 12/26/15 1225 12/26/15 1814 12/26/15 2141 12/27/15 0627  GLUCAP 77 95 158* 200* 155*    No results found for this or any previous visit (from the past 240 hour(s)).   Studies:              All Imaging reviewed and is as per above notation   Scheduled Meds: . atenolol  50 mg Oral Daily  . celecoxib  200 mg Oral Daily  . docusate sodium  100 mg Oral BID  . enoxaparin (LOVENOX) injection  40 mg Subcutaneous Daily  . insulin aspart  0-15 Units Subcutaneous TID WC  . insulin aspart  4 Units Subcutaneous TID WC  . insulin glargine  25 Units Subcutaneous QHS  . Vitamin D (Ergocalciferol)  50,000 Units Oral Q7 days   Continuous Infusions:  Assessment/Plan:  1. Severe tricompartmental bilateral knee arthritis-appreciate orthopedics inputpatient still in severe pain.  Primary orthopedic service Dr. Berenice Primas consulted for eval-and received higher dose injection in knee and has been place don IV steroids Might need SNF but patient not enthusiastic about this plan--MRI both knees pending--Autoimmune labs ordere dby Orthopedics--recommend caution with toradol ordered by Ortho as could affect kidney function 2. Insulin-dependent diabetes mellitus-usual dose of 7030 insulin 40 twice a day . He was started on Lantus 25 units. We will keep him on sliding scale. Blood sugars are fairlycontrolled in the 77-200 range.  Increase insulin as prn.  metfromin on hold 3. Gout-this  is clinically quiescent.  restart  colchicine  4. Possible psoriasis-there is no Skin or Rheum service that comes to the Hospital.  Patient will need OP follow up. 5. Microcytic anemia-monitor. Iron studies show Iron 53, saturation 31 so no need for IV iron and slow replacement as OP.  Should consider OP colonoscopy if not done prior 6. Hypertension continue atenolol 50 daily. mod Controlled.  Increase atenolol in am if persistently above sys 140 7. Back pain-improved.  Monitor.  Hold xray for now 8. Vit D def.  placed on once weekly replacement of Vit D 50,000 units   No family at the bedside Inpatient  Verneita Griffes, MD  [facility] Pager [phone] 12/27/2015, 11:45 AM

## 2015-12-27 NOTE — Progress Notes (Signed)
New orders in for ativan prn. Offered patient prn Ativan prior to going down for MRI again but patient refused. Patient is told scan needs to be done but refuses tonight for any MRI or xrays to be done. Offer to try in the morning first thing and patient said he wants to "play it by ear". Patient explained that it has to be done and will try again in the morning.

## 2015-12-27 NOTE — Progress Notes (Signed)
Orthopaedic Trauma Service Progress Note  Subjective  Pt seen and evaluated by Dr. Berenice Primas and Gaspar Skeeters, PA-C yesterday. Appreciate their assistance Another attempt was made at aspiration of R knee. This did not yield any fluid.  Pt then received another 80 mg of depo-medrol in each knee, along with marcaine Pt also received 3 doses of IV decadron yesterday as well   Pt feels a little better this am but not improved as would be expected with arthritic or gouty flare   Pt states that his knees have been hurting him around 4 weeks. He has not been to work in 4 weeks Denies any antecedent illnesses Denies travel outside of the state  No family hx of autoimmune diseases Pt denies hx of STI's  Denies back pain or other current joint pain other than B knees   Hx of gout, that was in his R olecranon bursa (11/16)  Diabetes under fair control - hgba1c: 6.9%  Sed rate: 111 mm/hr CRP: 9.4 mg/dL Uric acid: 7.2 mg/dL (normal) Vitamin d : 14.2 ng/mL  Pt afebrile, no elevate WBC count or left shift    Review of Systems  Constitutional: Negative for fever, chills, weight loss and malaise/fatigue.  Cardiovascular: Negative for chest pain and palpitations.  Gastrointestinal: Negative for nausea, vomiting and abdominal pain.  Neurological: Negative for headaches.      Objective   BP 148/92 mmHg  Pulse 76  Temp(Src) 98 F (36.7 C) (Oral)  Resp 16  Ht 5\' 5"  (1.651 m)  Wt 107.956 kg (238 lb)  BMI 39.61 kg/m2  SpO2 98%  Intake/Output      02/07 0701 - 02/08 0700 02/08 0701 - 02/09 0700   P.O. 600    Total Intake(mL/kg) 600 (5.6)    Urine (mL/kg/hr) 1300 (0.5)    Total Output 1300     Net -700          Urine Occurrence 1 x      Labs  See above   Exam  Gen: resting in bed, NAD Ext:      B Lower Extremities  No change in exam  Pt trying to actively move knees but only able to achieve about 30 degrees of flexion   Assessment and Plan   POD/HD#: 4  1.  Uncontrollable B knee pain, oligoarthropathy   Would have expected more significant improvement with additional steroid injections  MRI's of knees ordered   ? If this is of more systemic etiology then endstage DJD   Will check additional labs    Consider RA, seroneg arthropathy   Check RPR, hepatitis panel   Lyme panel       Continue with therapies    2. Pain management:  Continue with current management   May need to consider oral steroids    3. Medical issues   Continue per medicine service   4. Metabolic Bone Disease:  Vitamin d deficiency    Check additional labs    Likely related to diabetes   5. Dispo:  Continue with current care  Follow up on labs and MRI's     Jari Pigg, PA-C [facility] [phone] (O) 12/27/2015 9:17 AM

## 2015-12-27 NOTE — Progress Notes (Signed)
Pt brought down for bilat knee MRI's without and with contrast.  Pt stated he was claustrophobic when he came down, we explained that his head would no be in the scanner and he agreed to try.  Pt was in scanner and was unable to hold still for exam, then stated that he did not want to continue he was too scared.  Pt will have to be medicated for not only claustrophobia but pain so maybe he would be able to hold still for images.  Floor nurse called and offered to get med order tonight but pt did not want to continue further.

## 2015-12-27 NOTE — Progress Notes (Signed)
Physical Therapy Treatment Patient Details Name: [name] MRN: [id number] DOB: 01-15-1961 Today's Date: 12/27/2015    History of Present Illness 55 y.o. 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 Pt with recent hospital visit in January 2017 and pt reports he has not gotten out bed since that time due to severe arthritis and swelling in bilateral knees. PMH significant for HTN, DM type II, HLD, gout, and septic arthritis of R elbow.    PT Comments    Patient is making progress toward mobility goals and with less c/o pain this session. Continue to progress as tolerated.   Follow Up Recommendations  SNF;Supervision/Assistance - 24 hour     Equipment Recommendations  Wheelchair (measurements PT);Rolling walker with 5" wheels;Wheelchair cushion (measurements PT)    Recommendations for Other Services       Precautions / Restrictions Precautions Precautions: Fall Restrictions Weight Bearing Restrictions: No    Mobility  Bed Mobility Overal bed mobility: Needs Assistance;+2 for physical assistance       Supine to sit: Min assist;Mod assist;+2 for physical assistance     General bed mobility comments: assistance to bring bilat LE to EOB and elevate trunk into sitting; vc for technique  Transfers Overall transfer level: Needs assistance Equipment used: Rolling walker (2 wheeled) Transfers: Sit to/from [facility] Sit to Stand: Max assist;+2 physical assistance Stand pivot transfers: Max assist;+2 physical assistance       General transfer comment: assistance to power up into standing and maintain balance throughout transfers; max vc for techniique/sequencin and posture  Ambulation/Gait             General Gait Details: unable    Stairs            Wheelchair Mobility    Modified Rankin (Stroke Patients Only)       Balance Overall balance assessment: Needs assistance Sitting-balance support: No  upper extremity supported;Feet supported Sitting balance-Leahy Scale: Fair     Standing balance support: Bilateral upper extremity supported Standing balance-Leahy Scale: Zero                      Cognition Arousal/Alertness: Awake/alert Behavior During Therapy: Anxious Overall Cognitive Status: No family/caregiver present to determine baseline cognitive functioning Area of Impairment: Following commands;Problem solving       Following Commands: Follows one step commands inconsistently;Follows one step commands with increased time     Problem Solving: Slow processing;Decreased initiation;Requires verbal cues;Requires tactile cues      Exercises General Exercises - Lower Extremity Ankle Circles/Pumps: AROM;Both;10 reps;Supine Quad Sets: AROM;Both;10 reps;Seated Long Arc Quad: AAROM;Both;10 reps;Seated    General Comments        Pertinent Vitals/Pain Pain Assessment: 0-10 Pain Score: 7  Pain Location: R knee more than L knee Pain Descriptors / Indicators: Sore Pain Intervention(s): Limited activity within patient's tolerance;Monitored during session;Premedicated before session;Repositioned    Home Living                      Prior Function            PT Goals (current goals can now be found in the care plan section) Acute Rehab PT Goals Patient Stated Goal: to get out of this bed Progress towards PT goals: Progressing toward goals    Frequency  Min 3X/week    PT Plan Current plan remains appropriate    Co-evaluation  End of Session Equipment Utilized During Treatment: Gait belt Activity Tolerance: Patient tolerated treatment well Patient left: in chair;with call bell/phone within reach;with family/visitor present     Time: [id number] PT Time Calculation (min) (ACUTE ONLY): 19 min  Charges:  $Therapeutic Activity: 8-22 mins                    G Codes:      [place] April, PTA Pager: [phone]   12/27/2015, 4:28 PM

## 2015-12-28 ENCOUNTER — Observation Stay (HOSPITAL_COMMUNITY): Payer: Self-pay

## 2015-12-28 ENCOUNTER — Encounter (HOSPITAL_COMMUNITY): Payer: Self-pay | Admitting: General Practice

## 2015-12-28 DIAGNOSIS — M25562 Pain in left knee: Secondary | ICD-10-CM

## 2015-12-28 DIAGNOSIS — G8929 Other chronic pain: Secondary | ICD-10-CM

## 2015-12-28 DIAGNOSIS — M5489 Other dorsalgia: Secondary | ICD-10-CM

## 2015-12-28 DIAGNOSIS — M25561 Pain in right knee: Secondary | ICD-10-CM

## 2015-12-28 DIAGNOSIS — R52 Pain, unspecified: Secondary | ICD-10-CM

## 2015-12-28 LAB — CYCLIC CITRUL PEPTIDE ANTIBODY, IGG/IGA: CCP ANTIBODIES IGG/IGA: 6 U (ref 0–19)

## 2015-12-28 LAB — ROCKY MTN SPOTTED FVR ABS PNL(IGG+IGM)
RMSF IGG: NEGATIVE
RMSF IgM: 0.48 index (ref 0.00–0.89)

## 2015-12-28 LAB — BASIC METABOLIC PANEL
Anion gap: 10 (ref 5–15)
BUN: 25 mg/dL — AB (ref 6–20)
CHLORIDE: 104 mmol/L (ref 101–111)
CO2: 26 mmol/L (ref 22–32)
CREATININE: 0.73 mg/dL (ref 0.61–1.24)
Calcium: 9.3 mg/dL (ref 8.9–10.3)
GFR calc Af Amer: >60 mL/min (ref >60)
GFR calc non Af Amer: >60 mL/min (ref >60)
GLUCOSE: 109 mg/dL — AB (ref 65–99)
Potassium: 3.6 mmol/L (ref 3.5–5.1)
SODIUM: 140 mmol/L (ref 135–145)

## 2015-12-28 LAB — CBC
HCT: 33.4 % — ABNORMAL LOW (ref 39.0–52.0)
HEMOGLOBIN: 10.6 g/dL — AB (ref 13.0–17.0)
MCH: 22.7 pg — AB (ref 26.0–34.0)
MCHC: 31.7 g/dL (ref 30.0–36.0)
MCV: 71.7 fL — AB (ref 78.0–100.0)
Platelets: 259 10*3/uL (ref 150–400)
RBC: 4.66 MIL/uL (ref 4.22–5.81)
RDW: 16.3 % — ABNORMAL HIGH (ref 11.5–15.5)
WBC: 7.7 10*3/uL (ref 4.0–10.5)

## 2015-12-28 LAB — HEPATITIS PANEL, ACUTE
HCV Ab: <0.1 s/co ratio (ref 0.0–0.9)
HEP B C IGM: NEGATIVE
HEP B S AG: NEGATIVE
Hep A IgM: NEGATIVE

## 2015-12-28 LAB — PREALBUMIN: Prealbumin: 14.4 mg/dL — ABNORMAL LOW (ref 18–38)

## 2015-12-28 LAB — PHOSPHORUS: Phosphorus: 3.2 mg/dL (ref 2.5–4.6)

## 2015-12-28 LAB — GLUCOSE, CAPILLARY
GLUCOSE-CAPILLARY: 98 mg/dL (ref 65–99)
GLUCOSE-CAPILLARY: 108 mg/dL — AB (ref 65–99)
Glucose-Capillary: 113 mg/dL — ABNORMAL HIGH (ref 65–99)
Glucose-Capillary: 111 mg/dL — ABNORMAL HIGH (ref 65–99)

## 2015-12-28 LAB — ANTINUCLEAR ANTIBODIES, IFA: ANTINUCLEAR ANTIBODIES, IFA: NEGATIVE

## 2015-12-28 LAB — MAGNESIUM: Magnesium: 1.8 mg/dL (ref 1.7–2.4)

## 2015-12-28 LAB — RHEUMATOID FACTOR: Rhuematoid fact SerPl-aCnc: 13.5 IU/mL (ref 0.0–13.9)

## 2015-12-28 LAB — RPR: RPR Ser Ql: NONREACTIVE

## 2015-12-28 LAB — HIV ANTIBODY (ROUTINE TESTING W REFLEX): HIV Screen 4th Generation wRfx: NONREACTIVE

## 2015-12-28 LAB — TSH: TSH: 0.994 u[IU]/mL (ref 0.350–4.500)

## 2015-12-28 MED ORDER — OXYCODONE-ACETAMINOPHEN 5-325 MG PO TABS
1.0000 | ORAL_TABLET | ORAL | Status: DC | PRN
  Administered 2015-12-28 – 2015-12-30 (×6): 2 via ORAL
  Administered 2015-12-30: 1 via ORAL
  Administered 2015-12-31: 2 via ORAL
  Filled 2015-12-28 (×8): qty 2

## 2015-12-28 MED ORDER — INSULIN ASPART 100 UNIT/ML ~~LOC~~ SOLN
3.0000 [IU] | Freq: Three times a day (TID) | SUBCUTANEOUS | Status: DC
  Administered 2015-12-30 – 2016-01-01 (×5): 3 [IU] via SUBCUTANEOUS

## 2015-12-28 MED ORDER — SENNOSIDES-DOCUSATE SODIUM 8.6-50 MG PO TABS
1.0000 | ORAL_TABLET | Freq: Two times a day (BID) | ORAL | Status: DC
  Administered 2015-12-28 – 2016-01-01 (×6): 1 via ORAL
  Filled 2015-12-28 (×5): qty 1

## 2015-12-28 MED ORDER — NAPROXEN 250 MG PO TABS
500.0000 mg | ORAL_TABLET | Freq: Two times a day (BID) | ORAL | Status: DC
  Administered 2015-12-28: 500 mg via ORAL
  Filled 2015-12-28: qty 2

## 2015-12-28 MED ORDER — PANTOPRAZOLE SODIUM 40 MG PO TBEC
40.0000 mg | DELAYED_RELEASE_TABLET | Freq: Every day | ORAL | Status: DC
  Administered 2015-12-30 – 2016-01-01 (×3): 40 mg via ORAL
  Filled 2015-12-28 (×3): qty 1

## 2015-12-28 NOTE — Progress Notes (Signed)
Orthopaedic Trauma Service Progress Note  Subjective  Patient seen and evaluated this morning No real changes So with bilateral knee pain Was out of bed to chair yesterday As of 11:30 this morning he has been in bed and not in a chair yet  Patient unable to go through with MRI yesterday and does not want MRI at this time  Several labs are still pending however rheumatoid factor is negative Pre-albumin as low as is albumin  ROS As above  Objective   BP 142/84 mmHg  Pulse 61  Temp(Src) 98 F (36.7 C) (Oral)  Resp 18  Ht 5\' 5"  (1.651 m)  Wt 107.956 kg (238 lb)  BMI 39.61 kg/m2  SpO2 100%  Intake/Output      02/08 0701 - 02/09 0700 02/09 0701 - 02/10 0700   P.O. 1000    Total Intake(mL/kg) 1000 (9.3)    Urine (mL/kg/hr) 1200 (0.5) 400 (0.6)   Total Output 1200 400   Net -200 -400            Exam  Gen: Lying in bed. He was quite comfortable appearing when I walked in the room and he was talking to the social worker however after talking to him for a little bit and having him starting him of his legs he was in excruciating pain  Ext:       Bilateral lower extremities  No change in exam     Assessment and Plan   POD/HD#:   1. Uncontrollable B knee pain, oligoarthropathy, seronegative                Continue with therapies  Still awaiting additonal autoimmu  Negative RF, RPR, HIV, hepatitis    xrays of Lumbar spine and pelvis show DDD. No obvious sacroiliitis or evidence of ankylosing spondylitis    May want to consider dc'ing on steroid dose pack    Agree with percocet   Will likely need Rheum follow up and either steroid or DMARD   May also be severe case of endstage ankylosing arthritis but again being bilateral with minimal relief with intra-articular steroids very unusual                2. Pain management:             Continue with current management               May need to consider oral steroids    3. Medical issues    Continue per medicine service   4. Metabolic Bone Disease:             Vitamin d deficiency                           Check additional labs                           Likely related to diabetes     Supplement   5. Dispo:             Continue with current care             Follow up on labs and MRI's    Jari Pigg, PA-C [facility] [phone] (O) 12/28/2015 1:15 PM

## 2015-12-28 NOTE — Progress Notes (Signed)
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 Insulin-dependent diabetes mellitus-usual dose of 7030 insulin 40 twice a day .  -continue lantus, SSI, cut down meal coverage  3. Gout- stable, on colchicine   4. Severe Chronic hyperkeratosis/not typical for psoriasis, needs Derm FU -there is no Skin or Rheum service that comes to the Hospital.  Patient will need OP follow up.  5. Anemia of chronic disease-stable, monitor  6. Hypertension continue atenolol 50 daily. mod Controlled  7. Back pain-improved.    8. Vit D def.  placed on once weekly replacement of Vit D 50,000 units  Full Code No family at the bedside Dispo: recommended SNF, he will d/w girlfriend and notify us   Consultants:  Orthopedics  Procedures:  Bilateral knee injections   Antibiotics:  None   Subjective   Still in mod amount of pain, some  improvement noted    Objective      Objective: Filed Vitals:   12/27/15 0533 12/27/15 1442 12/27/15 2101 12/28/15 0500  BP: 148/92 122/85 128/85 142/84  Pulse: 76 72 65 61  Temp: 98 F (36.7 C) 98.1 F (36.7 C) 98.2 F (36.8 C) 98 F (36.7 C)  TempSrc: Oral Oral Oral Oral  Resp: '16 18 18 18  ' Height:      Weight:      SpO2: 98% 99% 96% 100%    Intake/Output Summary (Last 24 hours) at 12/28/15 1047 Last data filed at 12/28/15 0606  Gross per 24 hour  Intake    880 ml  Output   1000 ml  Net   -120 ml    Exam:  General: AAOx3, no distress Cardiovascular: S1-S2 no murmur rub or gallop Respiratory: Clinically clear no added sound Abdomen: Soft nontender nondistended no rebound no guarding Skin moderate tenderness to both knees, swelling, injection site noted and severe hyperkeratosis of lower extremitiesll  Data Reviewed: Basic Metabolic Panel:  Recent Labs Lab 12/23/15 2152 12/26/15 0245 12/27/15 1114 12/28/15 0726  NA 140 137 137 140  K 4.2 3.9 3.7 3.6  CL 106 102 102 104  CO2 21* 24 20* 26  GLUCOSE 123* 74 174* 109*  BUN 21* 24* 23* 25*  CREATININE 0.78 0.81 0.83 0.73  CALCIUM 9.7 9.1 9.6 9.3  MG  --   --   --  1.8  PHOS  --   --   --  3.2   Liver Function Tests:  Recent Labs Lab 12/26/15 0245 12/27/15 1114  AST 28 26  ALT 42 43  ALKPHOS 104 104  BILITOT 1.0 0.7  PROT 8.3* 8.4*  ALBUMIN 2.0* 2.1*   No results for input(s): LIPASE, AMYLASE in the last 168 hours. No results for input(s): AMMONIA in the last 168 hours. CBC:  Recent Labs Lab 12/23/15 2152 12/27/15 1114 12/28/15 0726  WBC 8.4 7.2 7.7  NEUTROABS 6.3 5.9  --   HGB 12.0* 10.6* 10.6*  HCT 37.7* 33.7* 33.4*  MCV 71.9* 70.4* 71.7*  PLT 240 284 259   Cardiac Enzymes: No results for input(s): CKTOTAL, CKMB, CKMBINDEX, TROPONINI in the last 168 hours. BNP: Invalid input(s): POCBNP CBG:  Recent Labs Lab 12/27/15 0627 12/27/15 1218 12/27/15 1651 12/27/15 2158  12/28/15 0706  GLUCAP 155* 167* 143* 114* 98    No results found for this or any previous visit (from the past 240 hour(s)).   Studies:              All Imaging reviewed and is as per above notation   Scheduled Meds: . atenolol  50 mg Oral Daily  . colchicine  0.6 mg Oral Daily  . docusate sodium  100 mg Oral BID  . enoxaparin (LOVENOX) injection  40 mg Subcutaneous Daily  . insulin aspart  0-15 Units Subcutaneous TID WC  . insulin aspart  4 Units Subcutaneous TID WC  . insulin glargine  25 Units Subcutaneous QHS  . naproxen  500 mg Oral BID WC  . Vitamin D (Ergocalciferol)  50,000 Units Oral Q7 days   Continuous Infusions:    Domenic Polite, MD  [facility] Pager [phone] 12/28/2015, 10:47 AM

## 2015-12-28 NOTE — Progress Notes (Signed)
Subjective: Still complains of bilateral knee pain.  Some mild improvement.  Does not want  to go to skilled nursing facility.  Was out of bed to chair yesterday.Patient reports that he could not handle the MRI.  He does not want to do an MRI.  Objective: Vital signs in last 24 hours: Temp:  [98 F (36.7 C)-98.2 F (36.8 C)] 98 F (36.7 C) (02/09 0500) Pulse Rate:  [61-72] 61 (02/09 0500) Resp:  [18] 18 (02/09 0500) BP: (122-142)/(84-85) 142/84 mmHg (02/09 0500) SpO2:  [96 %-100 %] 100 % (02/09 0500)  Intake/Output from previous day: 02/08 0701 - 02/09 0700 In: 1000 [P.O.:1000] Out: 1200 [Urine:1200] Intake/Output this shift: Total I/O In: -  Out: 400 [Urine:400]   Recent Labs  12/27/15 1114 12/28/15 0726  HGB 10.6* 10.6*    Recent Labs  12/27/15 1114 12/28/15 0726  WBC 7.2 7.7  RBC 4.79 4.66  HCT 33.7* 33.4*  PLT 284 259    Recent Labs  12/27/15 1114 12/28/15 0726  NA 137 140  K 3.7 3.6  CL 102 104  CO2 20* 26  BUN 23* 25*  CREATININE 0.83 0.73  GLUCOSE 174* 109*  CALCIUM 9.6 9.3  ESR 115 CRP 14.1. Rheumatoid factor   , 13.5  X-rays of lumbar spine and pelvis show lumbar DDD.  No significant hip arthritis. Bilateral knee exams: Less swelling of both knees.  Still has very limited range of motion.  Apparent pain through range of motion.  Full range of motion of both hips.   Lumbar spine is nontender.  Assessment/Plan: Puzzling bilateral incapacitating knee pain. 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 Plan: Will increase pain medication to Percocet 5 mg 1 or 2 every 3-4 hours as needed for pain. .  Once he is able to ambulate with a walker he can be discharged home on oral pain meds.Would use Percocet 5 mg one or 2 every 4-6 hours as needed for pain.  Will probably need referral to rheumatology clinic.  His knee pain can be managed as an outpatient. Followup with Dr. Berenice Primas in 5-7 days.    Mineral G 12/28/2015, 12:47 PM

## 2015-12-28 NOTE — Clinical Social Work Note (Signed)
CSW spoke with patient and his girlfriend, patient still would like to go home instead of a SNF.  CSW explained patient the benefit of going to a SNF for short term rehab, patient stated he would still rather go home, but will talk with his girlfriend about it.  CSW to follow up with patient on Friday to see what his decision was.  Jones Broom. [place], MSW, [place] 12/28/2015 5:54 PM

## 2015-12-29 ENCOUNTER — Observation Stay (HOSPITAL_COMMUNITY): Payer: Self-pay | Admitting: Certified Registered Nurse Anesthetist

## 2015-12-29 ENCOUNTER — Encounter (HOSPITAL_COMMUNITY)
Admission: EM | Disposition: A | Discharge status: Skilled Nursing Facility | Payer: Self-pay | Source: Home / Self Care | Attending: Internal Medicine

## 2015-12-29 ENCOUNTER — Encounter (HOSPITAL_COMMUNITY): Payer: Self-pay | Admitting: Certified Registered Nurse Anesthetist

## 2015-12-29 DIAGNOSIS — M11861 Other specified crystal arthropathies, right knee: Secondary | ICD-10-CM

## 2015-12-29 DIAGNOSIS — S83206A Unspecified tear of unspecified meniscus, current injury, right knee, initial encounter: Secondary | ICD-10-CM

## 2015-12-29 DIAGNOSIS — S83207A Unspecified tear of unspecified meniscus, current injury, left knee, initial encounter: Secondary | ICD-10-CM

## 2015-12-29 DIAGNOSIS — S83207D Unspecified tear of unspecified meniscus, current injury, left knee, subsequent encounter: Secondary | ICD-10-CM

## 2015-12-29 DIAGNOSIS — M11862 Other specified crystal arthropathies, left knee: Secondary | ICD-10-CM

## 2015-12-29 HISTORY — PX: KNEE ARTHROSCOPY: SHX127

## 2015-12-29 LAB — BASIC METABOLIC PANEL
ANION GAP: 7 (ref 5–15)
BUN: 30 mg/dL — AB (ref 6–20)
CALCIUM: 9.2 mg/dL (ref 8.9–10.3)
CO2: 26 mmol/L (ref 22–32)
CREATININE: 0.8 mg/dL (ref 0.61–1.24)
Chloride: 106 mmol/L (ref 101–111)
GFR calc Af Amer: >60 mL/min (ref >60)
GLUCOSE: 97 mg/dL (ref 65–99)
Potassium: 4 mmol/L (ref 3.5–5.1)
Sodium: 139 mmol/L (ref 135–145)

## 2015-12-29 LAB — GLUCOSE, CAPILLARY
GLUCOSE-CAPILLARY: 82 mg/dL (ref 65–99)
GLUCOSE-CAPILLARY: 137 mg/dL — AB (ref 65–99)
GLUCOSE-CAPILLARY: 100 mg/dL — AB (ref 65–99)
Glucose-Capillary: 98 mg/dL (ref 65–99)
Glucose-Capillary: 82 mg/dL (ref 65–99)
Glucose-Capillary: 95 mg/dL (ref 65–99)

## 2015-12-29 LAB — GRAM STAIN

## 2015-12-29 LAB — CBC
HCT: 32.6 % — ABNORMAL LOW (ref 39.0–52.0)
Hemoglobin: 10.4 g/dL — ABNORMAL LOW (ref 13.0–17.0)
MCH: 23.1 pg — ABNORMAL LOW (ref 26.0–34.0)
MCHC: 31.9 g/dL (ref 30.0–36.0)
MCV: 72.3 fL — AB (ref 78.0–100.0)
PLATELETS: 236 10*3/uL (ref 150–400)
RBC: 4.51 MIL/uL (ref 4.22–5.81)
RDW: 16.4 % — AB (ref 11.5–15.5)
WBC: 6.9 10*3/uL (ref 4.0–10.5)

## 2015-12-29 LAB — SEX HORMONE BINDING GLOBULIN: SEX HORMONE BINDING: 30.7 nmol/L (ref 19.3–76.4)

## 2015-12-29 LAB — PTH, INTACT AND CALCIUM
CALCIUM TOTAL (PTH): 9 mg/dL (ref 8.7–10.2)
PTH: 13 pg/mL — AB (ref 15–65)

## 2015-12-29 LAB — TESTOSTERONE: TESTOSTERONE: 612 ng/dL (ref 348–1197)

## 2015-12-29 LAB — SURGICAL PCR SCREEN
MRSA, PCR: NEGATIVE
STAPHYLOCOCCUS AUREUS: NEGATIVE

## 2015-12-29 LAB — TESTOSTERONE, FREE: Testosterone, Free: 14.4 pg/mL (ref 7.2–24.0)

## 2015-12-29 LAB — CALCIUM, IONIZED: CALCIUM, IONIZED, SERUM: 5.6 mg/dL (ref 4.5–5.6)

## 2015-12-29 SURGERY — ARTHROSCOPY, KNEE, BILATERAL
Anesthesia: General | Site: Knee | Laterality: Bilateral

## 2015-12-29 MED ORDER — FENTANYL CITRATE (PF) 250 MCG/5ML IJ SOLN
INTRAMUSCULAR | Status: AC
  Filled 2015-12-29: qty 5

## 2015-12-29 MED ORDER — FENTANYL CITRATE (PF) 100 MCG/2ML IJ SOLN
INTRAMUSCULAR | Status: DC | PRN
  Administered 2015-12-29 (×2): 25 ug via INTRAVENOUS
  Administered 2015-12-29: 50 ug via INTRAVENOUS
  Administered 2015-12-29 (×2): 25 ug via INTRAVENOUS
  Administered 2015-12-29 (×2): 50 ug via INTRAVENOUS

## 2015-12-29 MED ORDER — PROMETHAZINE HCL 25 MG/ML IJ SOLN: 6.2500 mg | INTRAMUSCULAR | Status: DC | PRN

## 2015-12-29 MED ORDER — MIDAZOLAM HCL 2 MG/2ML IJ SOLN
INTRAMUSCULAR | Status: AC
  Filled 2015-12-29: qty 2

## 2015-12-29 MED ORDER — ONDANSETRON HCL 4 MG/2ML IJ SOLN
INTRAMUSCULAR | Status: DC | PRN
  Administered 2015-12-29: 4 mg via INTRAVENOUS

## 2015-12-29 MED ORDER — INSULIN GLARGINE 100 UNIT/ML ~~LOC~~ SOLN
20.0000 [IU] | Freq: Every day | SUBCUTANEOUS | Status: DC
  Administered 2015-12-29 – 2015-12-31 (×3): 20 [IU] via SUBCUTANEOUS
  Filled 2015-12-29 (×4): qty 0.2

## 2015-12-29 MED ORDER — KETOROLAC TROMETHAMINE 30 MG/ML IJ SOLN
INTRAMUSCULAR | Status: AC
  Administered 2015-12-29: 30 mg via INTRAVENOUS
  Filled 2015-12-29: qty 1

## 2015-12-29 MED ORDER — HYDROMORPHONE HCL 1 MG/ML IJ SOLN
INTRAMUSCULAR | Status: AC
  Filled 2015-12-29: qty 1

## 2015-12-29 MED ORDER — SODIUM CHLORIDE 0.9 % IR SOLN
Status: DC | PRN
  Administered 2015-12-29 (×3): 3000 mL

## 2015-12-29 MED ORDER — CEFAZOLIN SODIUM 1-5 GM-% IV SOLN
1.0000 g | Freq: Three times a day (TID) | INTRAVENOUS | Status: AC
  Administered 2015-12-29 – 2015-12-30 (×3): 1 g via INTRAVENOUS
  Filled 2015-12-29 (×3): qty 50

## 2015-12-29 MED ORDER — KETOROLAC TROMETHAMINE 30 MG/ML IJ SOLN
15.0000 mg | Freq: Three times a day (TID) | INTRAMUSCULAR | Status: AC
  Administered 2015-12-29 – 2015-12-30 (×2): 15 mg via INTRAVENOUS
  Filled 2015-12-29 (×2): qty 1

## 2015-12-29 MED ORDER — MIDAZOLAM HCL 5 MG/5ML IJ SOLN
INTRAMUSCULAR | Status: DC | PRN
  Administered 2015-12-29 (×2): 1 mg via INTRAVENOUS

## 2015-12-29 MED ORDER — BUPIVACAINE HCL 0.5 % IJ SOLN
INTRAMUSCULAR | Status: DC | PRN
  Administered 2015-12-29 (×2): 20 mL

## 2015-12-29 MED ORDER — ONDANSETRON HCL 4 MG/2ML IJ SOLN
INTRAMUSCULAR | Status: AC
  Filled 2015-12-29: qty 2

## 2015-12-29 MED ORDER — ARTIFICIAL TEARS OP OINT
TOPICAL_OINTMENT | OPHTHALMIC | Status: AC
  Filled 2015-12-29: qty 3.5

## 2015-12-29 MED ORDER — PROPOFOL 10 MG/ML IV BOLUS
INTRAVENOUS | Status: AC
  Filled 2015-12-29: qty 20

## 2015-12-29 MED ORDER — LACTATED RINGERS IV SOLN
INTRAVENOUS | Status: DC
  Administered 2015-12-29: 09:00:00 via INTRAVENOUS

## 2015-12-29 MED ORDER — LIDOCAINE HCL (CARDIAC) 20 MG/ML IV SOLN
INTRAVENOUS | Status: DC | PRN
  Administered 2015-12-29: 100 mg via INTRAVENOUS

## 2015-12-29 MED ORDER — KETOROLAC TROMETHAMINE 30 MG/ML IJ SOLN
30.0000 mg | Freq: Once | INTRAMUSCULAR | Status: AC
  Administered 2015-12-29: 30 mg via INTRAVENOUS

## 2015-12-29 MED ORDER — BUPIVACAINE HCL (PF) 0.5 % IJ SOLN
INTRAMUSCULAR | Status: AC
  Filled 2015-12-29: qty 60

## 2015-12-29 MED ORDER — HYDROMORPHONE HCL 1 MG/ML IJ SOLN
0.2500 mg | INTRAMUSCULAR | Status: DC | PRN
  Administered 2015-12-29: 0.5 mg via INTRAVENOUS

## 2015-12-29 MED ORDER — CEFAZOLIN SODIUM-DEXTROSE 2-3 GM-% IV SOLR
INTRAVENOUS | Status: DC | PRN
  Administered 2015-12-29: 2 g via INTRAVENOUS

## 2015-12-29 MED ORDER — PROPOFOL 10 MG/ML IV BOLUS
INTRAVENOUS | Status: DC | PRN
  Administered 2015-12-29: 200 mg via INTRAVENOUS

## 2015-12-29 SURGICAL SUPPLY — 53 items
BANDAGE ELASTIC 6 VELCRO ST LF (GAUZE/BANDAGES/DRESSINGS) ×4 IMPLANT
BLADE CUDA 5.5 (BLADE) IMPLANT
BLADE CUTTER GATOR 3.5 (BLADE) IMPLANT
BLADE GREAT WHITE 4.2 (BLADE) ×2 IMPLANT
BLADE GREAT WHITE 4.2MM (BLADE) ×1
BNDG COHESIVE 4X5 TAN STRL (GAUZE/BANDAGES/DRESSINGS) ×2 IMPLANT
BNDG GAUZE ELAST 4 BULKY (GAUZE/BANDAGES/DRESSINGS) ×4 IMPLANT
CONT SPECI 4OZ STER CLIK (MISCELLANEOUS) ×8 IMPLANT
CUFF TOURNIQUET SINGLE 44IN (TOURNIQUET CUFF) IMPLANT
DECANTER SPIKE VIAL GLASS SM (MISCELLANEOUS) ×4 IMPLANT
DRAPE ARTHROSCOPY W/POUCH 114 (DRAPES) ×6 IMPLANT
DRAPE EXTREMITY BILATERAL (DRAPES) ×2 IMPLANT
DRAPE IMP U-DRAPE 54X76 (DRAPES) ×2 IMPLANT
DRAPE PROXIMA HALF (DRAPES) ×8 IMPLANT
DRAPE STERI 35X30 U-POUCH (DRAPES) ×4 IMPLANT
DRAPE U-SHAPE 47X51 STRL (DRAPES) ×6 IMPLANT
DRSG ADAPTIC 3X8 NADH LF (GAUZE/BANDAGES/DRESSINGS) ×2 IMPLANT
DRSG EMULSION OIL 3X3 NADH (GAUZE/BANDAGES/DRESSINGS) ×6 IMPLANT
DRSG PAD ABDOMINAL 8X10 ST (GAUZE/BANDAGES/DRESSINGS) ×6 IMPLANT
DURAPREP 26ML APPLICATOR (WOUND CARE) ×3 IMPLANT
FILTER STRAW FLUID ASPIR (MISCELLANEOUS) ×6 IMPLANT
GAUZE SPONGE 4X4 12PLY STRL (GAUZE/BANDAGES/DRESSINGS) ×6 IMPLANT
GLOVE BIOGEL PI IND STRL 6.5 (GLOVE) IMPLANT
GLOVE BIOGEL PI IND STRL 8 (GLOVE) ×2 IMPLANT
GLOVE BIOGEL PI INDICATOR 6.5 (GLOVE) ×6
GLOVE BIOGEL PI INDICATOR 8 (GLOVE) ×4
GLOVE ECLIPSE 7.5 STRL STRAW (GLOVE) ×6 IMPLANT
GLOVE SURG SS PI 6.0 STRL IVOR (GLOVE) ×2 IMPLANT
GOWN STRL REUS W/ TWL LRG LVL3 (GOWN DISPOSABLE) ×1 IMPLANT
GOWN STRL REUS W/ TWL XL LVL3 (GOWN DISPOSABLE) ×3 IMPLANT
GOWN STRL REUS W/TWL LRG LVL3 (GOWN DISPOSABLE) ×3
GOWN STRL REUS W/TWL XL LVL3 (GOWN DISPOSABLE) ×9
KIT BASIN OR (CUSTOM PROCEDURE TRAY) ×3 IMPLANT
KIT ROOM TURNOVER OR (KITS) ×6 IMPLANT
NDL 18GX1X1/2 (RX/OR ONLY) (NEEDLE) ×2 IMPLANT
NEEDLE 18GX1X1/2 (RX/OR ONLY) (NEEDLE) ×6 IMPLANT
PACK ARTHROSCOPY DSU (CUSTOM PROCEDURE TRAY) ×3 IMPLANT
PAD ABD 8X10 STRL (GAUZE/BANDAGES/DRESSINGS) ×2 IMPLANT
PAD ARMBOARD 7.5X6 YLW CONV (MISCELLANEOUS) ×6 IMPLANT
PAD CAST 4YDX4 CTTN HI CHSV (CAST SUPPLIES) ×4 IMPLANT
PADDING CAST COTTON 4X4 STRL (CAST SUPPLIES) ×12
SET ARTHROSCOPY TUBING (MISCELLANEOUS) ×3
SET ARTHROSCOPY TUBING LN (MISCELLANEOUS) ×1 IMPLANT
SPONGE GAUZE 4X4 12PLY STER LF (GAUZE/BANDAGES/DRESSINGS) ×2 IMPLANT
STOCKINETTE IMPERVIOUS 9X36 MD (GAUZE/BANDAGES/DRESSINGS) ×2 IMPLANT
SUT ETHILON 4 0 PS 2 18 (SUTURE) ×6 IMPLANT
SWAB COLLECTION DEVICE MRSA (MISCELLANEOUS) ×4 IMPLANT
SWAB CULTURE LIQUID MINI MALE (MISCELLANEOUS) ×4 IMPLANT
SYR 5ML LL (SYRINGE) ×6 IMPLANT
SYR CONTROL 10ML LL (SYRINGE) ×2 IMPLANT
TOWEL OR 17X24 6PK STRL BLUE (TOWEL DISPOSABLE) ×3 IMPLANT
TOWEL OR 17X26 10 PK STRL BLUE (TOWEL DISPOSABLE) ×3 IMPLANT
WRAP KNEE MAXI GEL POST OP (GAUZE/BANDAGES/DRESSINGS) ×6 IMPLANT

## 2015-12-29 NOTE — Anesthesia Procedure Notes (Signed)
Procedure Name: LMA Insertion Date/Time: 12/29/2015 9:34 AM Performed by: [place] Pink Pre-anesthesia Checklist: Patient identified, Emergency Drugs available, Suction available, Patient being monitored and Timeout performed Patient Re-evaluated:Patient Re-evaluated prior to inductionOxygen Delivery Method: Circle system utilized Preoxygenation: Pre-oxygenation with 100% oxygen Intubation Type: IV induction Ventilation: Mask ventilation without difficulty LMA: LMA inserted LMA Size: 5.0 Number of attempts: 1 Placement Confirmation: positive ETCO2 and breath sounds checked- equal and bilateral Tube secured with: Tape Dental Injury: Teeth and Oropharynx as per pre-operative assessment

## 2015-12-29 NOTE — Transfer of Care (Signed)
Immediate Anesthesia Transfer of Care Note  Patient: [name]  Procedure(s) Performed: Procedure(s): ARTHROSCOPY KNEE BILATERAL WITH  PARTIAL MEDIAL AND LATERAL MENISCECTOMY AND FOUR COMPARTMENT SYNOVECTOMY , CHONDRALPLASTIES, PATELLA-FEMORAL CHONDRALPLASTIES BILATERALLY (Bilateral)  Patient Location: PACU  Anesthesia Type:General  Level of Consciousness: awake, alert  and oriented  Airway & Oxygen Therapy: Patient Spontanous Breathing and Patient connected to nasal cannula oxygen  Post-op Assessment: Report given to RN and Post -op Vital signs reviewed and stable  Post vital signs: Reviewed and stable  Last Vitals:  Filed Vitals:   12/29/15 0612 12/29/15 0824  BP: 140/79 156/93  Pulse: 54 58  Temp: 36.5 C 36.8 C  Resp: 16 18    Complications: No apparent anesthesia complications

## 2015-12-29 NOTE — Anesthesia Preprocedure Evaluation (Addendum)
Anesthesia Evaluation  Patient identified by MRN, date of birth, ID band Patient awake    Reviewed: Allergy & Precautions  Airway Mallampati: II  TM Distance: >3 FB Neck ROM: Full    Dental   Pulmonary neg pulmonary ROS,    breath sounds clear to auscultation       Cardiovascular hypertension,  Rhythm:Regular Rate:Normal     Neuro/Psych    GI/Hepatic negative GI ROS, Neg liver ROS,   Endo/Other  diabetes  Renal/GU negative Renal ROS     Musculoskeletal   Abdominal   Peds  Hematology   Anesthesia Other Findings   Reproductive/Obstetrics                            Anesthesia Physical Anesthesia Plan  ASA: III  Anesthesia Plan: General   Post-op Pain Management:    Induction: Intravenous  Airway Management Planned: LMA  Additional Equipment:   Intra-op Plan:   Post-operative Plan: Extubation in OR  Informed Consent: I have reviewed the patients History and Physical, chart, labs and discussed the procedure including the risks, benefits and alternatives for the proposed anesthesia with the patient or authorized representative who has indicated his/her understanding and acceptance.   Dental advisory given  Plan Discussed with: CRNA and Anesthesiologist  Anesthesia Plan Comments:         Anesthesia Quick Evaluation

## 2015-12-29 NOTE — Progress Notes (Signed)
Subjective: Pt still c/o severe bilat knee pain.  Minimal improvement with medicine and injections.  Pt still unable to flex and extend knees well. Continues to c/o severe pain.   Objective: Vital signs in last 24 hours: Temp:  [97.7 F (36.5 C)-98.2 F (36.8 C)] 97.7 F (36.5 C) (02/10 0612) Pulse Rate:  [54-66] 54 (02/10 0612) Resp:  [16-18] 16 (02/10 0612) BP: (102-140)/(72-92) 140/79 mmHg (02/10 0612) SpO2:  [99 %-100 %] 100 % (02/10 0612)  Intake/Output from previous day: 02/09 0701 - 02/10 0700 In: 720 [P.O.:720] Out: 1150 [Urine:1150] Intake/Output this shift:     Recent Labs  12/27/15 1114 12/28/15 0726 12/29/15 0604  HGB 10.6* 10.6* 10.4*    Recent Labs  12/28/15 0726 12/29/15 0604  WBC 7.7 6.9  RBC 4.66 4.51  HCT 33.4* 32.6*  PLT 259 236    Recent Labs  12/28/15 0726 12/29/15 0604  NA 140 139  K 3.6 4.0  CL 104 106  CO2 26 26  BUN 25* 30*  CREATININE 0.73 0.80  GLUCOSE 109* 97  CALCIUM 9.3 9.2   No results for input(s): LABPT, INR in the last 72 hours.  Neurologically intact ABD soft Neurovascular intact Sensation intact distally Compartment soft hyperkeratosis bilat le.  painful rom with severly limited rom bilat  Assessment/Plan: 55 yo male with relatively sudden and severe onset bilat knee pain who has failed injection of cortisone X2 and continues to have severe pain and inability to walk. Pt unable to undergo mri as he is unable to do that. // I had a long discussion with the patient and his girlfried and i feel that arthroscopy will give [place] information about his condition and perhaps will give him symptomatic relief.   He understands the risks, benefits and limitations of the procedure and wants to proceed.  I will plan on doing this procedure when OR time available.  I spoke with his internal med coverage and they agree with the procedure.     Cristina Mattern L 12/29/2015, 8:21 AM

## 2015-12-29 NOTE — Clinical Social Work Note (Signed)
Clinical Social Work Assessment  Patient Details  Name: [name] MRN: [id number] Date of Birth: 1961/08/26  Date of referral:  12/29/15               Reason for consult:  Facility Placement                Permission sought to share information with:  Facility Art therapist granted to share information::  Yes, Verbal Permission Granted  Name::        Agency::  SNF admissions  Relationship::     Contact Information:     Housing/Transportation Living arrangements for the past 2 months:  Single Family Home Source of Information:  Patient Patient Interpreter Needed:  None Criminal Activity/Legal Involvement Pertinent to Current Situation/Hospitalization:  No - Comment as needed Significant Relationships:  Significant Other Lives with:  Significant Other Do you feel safe going back to the place where you live?  No (Patient is feel he needs some short term rehab before he can go home.) Need for family participation in patient care:  Yes (Comment) (Patient requests that his girlfriend be involved in decision making)  Care giving concerns: Patient needs some short term rehab before he can safely return back home.   Social Worker assessment / plan:  Patient is a pleasant 55 year old male who lives with his girlfriend.  Patient is alert and oriented x4 and able to make his own decisions.  Patient expressed that he has not been to SNF rehab before, and would rather go home, but realizes that he needs some short term rehab before he can safely return home.  CSW explained process for SNF search to patient and told him that since he does not have insurance his options will be limited on where he can go for rehab, patient expressed understanding.  Patient expressed concerns that he will not be home with his girlfriend, but understands that he will only be at SNF for short period of time.  Patient is a self-pay, and states he has applied for Medicaid.  Patient expresses concern  that if he goes to facility and he does not like it he will not be able to leave.  CSW explained to patient that it is his right to leave if he decides it is not working out for him.  Patient expressed he did not have any other questions.   Employment status:  Unemployed Forensic scientist:  Self Pay (Medicaid Pending) PT Recommendations:  [place] / Referral to community resources:  [place]  Patient/Family's Response to care:  Patient agreeable to going to SNF for short term rehab.  Patient/Family's Understanding of and Emotional Response to Diagnosis, Current Treatment, and Prognosis: Patient aware of current treatment plan and diagnosis.  Emotional Assessment Appearance:  Appears stated age Attitude/Demeanor/Rapport:    Affect (typically observed):  Appropriate, Calm, Stable Orientation:  Oriented to Self, Oriented to Place, Oriented to  Time, Oriented to Situation Alcohol / Substance use:  Not Applicable Psych involvement (Current and /or in the community):  No (Comment)  Discharge Needs  Concerns to be addressed:  No discharge needs identified Readmission within the last 30 days:  No Current discharge risk:  None Barriers to Discharge:  Inadequate or no insurance   [name] 12/29/2015, 6:26 PM

## 2015-12-29 NOTE — Brief Op Note (Signed)
12/23/2015 - 12/29/2015  10:58 AM  PATIENT:  [name]  55 y.o. male  PRE-OPERATIVE DIAGNOSIS:  arthritis of the knee  POST-OPERATIVE DIAGNOSIS:  arthritis of the knee  PROCEDURE:  Procedure(s): ARTHROSCOPY KNEE BILATERAL WITH  PARTIAL MEDIAL AND LATERAL MENISCECTOMY AND FOUR COMPARTMENT SYNOVECTOMY , CHONDRALPLASTIES, PATELLA-FEMORAL CHONDRALPLASTIES BILATERALLY (Bilateral)  SURGEON:  Surgeon(s) and Role:    * Dorna Leitz, MD - Primary  PHYSICIAN ASSISTANT:   ASSISTANTS: bethune   ANESTHESIA:   general  EBL:     BLOOD ADMINISTERED:none  DRAINS: none   LOCAL MEDICATIONS USED:  MARCAINE     SPECIMEN:  Source of Specimen:  synovium bilateral knees  DISPOSITION OF SPECIMEN:  PATHOLOGY  COUNTS:  YES  TOURNIQUET:  * No tourniquets in log *  DICTATION: .Other Dictation: Dictation Number [id number]  PLAN OF CARE: Admit to inpatient   PATIENT DISPOSITION:  PACU - hemodynamically stable.   Delay start of Pharmacological VTE agent (>24hrs) due to surgical blood loss or risk of bleeding: no

## 2015-12-29 NOTE — Progress Notes (Signed)
[name] [id number] DOB: [birth year] DOA: 12/23/2015 PCP: Lenor Derrick, MD  Brief narrative: 55 year old male Insulin-dependent diabetes, Hypertension, Gout, Bilateral arthritis in knees-severe Was seen in the emergency room and 16th of January and given pain medication but has not been mobile and presented to the emergency room 12/23/15 with severe lower extremity pain and inability to mobilize  Orthopedics consulted and underwent Steroid injection to Knees 2/5 He was seen again and higher dose steroids injected on 2/7 Unable to undergo MRI despite 2 attempts with sedation 2/10 Bilateral arthroscopies  Assessment/Plan:  1. 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-appreciate orthopedics input -underwent Steroid injection to Knees 2/5 and then again had higher dose steroids injected 2/7 -ESR 115, CRP 14, RF negative -He has severe chronic hyperkeratosis does not look like typical Psoriasis though -was given IV decadron -now on Toradol PRN, add Naproxen -Unable to tolerate MRI -Today 2/10 underwent Bilateral Arthroscopies with PARTIAL MEDIAL AND LATERAL MENISCECTOMY AND FOUR COMPARTMENT SYNOVECTOMY , CHONDRALPLASTIES, PATELLA-FEMORAL CHONDRALPLASTIES BILATERALLY  -gram stain negative -will need Rheum FU and Ortho FU for Knee replacement -strongly advised pt again to consider Rehab for Discharge soon to prevent longterm disability  2. Insulin-dependent diabetes mellitus-usual dose of 7030 insulin 40 twice a day .  -continue lantus, SSI, cut down lantus and  meal coverage  3. Gout- stable, on colchicine   4. Severe Chronic hyperkeratosis/not typical for psoriasis, needs Derm FU -there is no Skin or Rheum service that comes to the Hospital.  Patient will need OP follow up.  5. Anemia of chronic disease-stable, monitor  6. Hypertension continue atenolol 50 daily. mod Controlled  7. Back pain-improved.    8. Vit D def.  placed on once weekly  replacement of Vit D 50,000 units  Full Code No family at the bedside, friend at bedside Dispo: Strongly recommended SNF   Consultants:  Orthopedics  Procedures:  Bilateral knee injections   Antibiotics:  None   Subjective   Still in mod amount of pain, some improvement noted    Objective      Objective: Filed Vitals:   12/29/15 1130 12/29/15 1145 12/29/15 1200 12/29/15 1403  BP: 147/80 154/93 144/96   Pulse: 54 58 57   Temp:   99 F (37.2 C)   TempSrc:      Resp: '10 13 11   ' Height:      Weight:      SpO2: 100% 100% 100% 100%    Intake/Output Summary (Last 24 hours) at 12/29/15 1420 Last data filed at 12/29/15 1200  Gross per 24 hour  Intake   1140 ml  Output    790 ml  Net    350 ml    Exam:  General: AAOx3, no distress Cardiovascular: S1-S2 no murmur rub or gallop Respiratory: Clinically clear no added sound Abdomen: Soft nontender nondistended no rebound no guarding Skin moderate tenderness to both knees, swelling, injection site noted and severe hyperkeratosis of lower extremitiesll  Data Reviewed: Basic Metabolic Panel:  Recent Labs Lab 12/23/15 2152 12/26/15 0245 12/27/15 1114 12/28/15 0726 12/29/15 0604  NA 140 137 137 140 139  K 4.2 3.9 3.7 3.6 4.0  CL 106 102 102 104 106  CO2 21* 24 20* 26 26  GLUCOSE 123* 74 174* 109* 97  BUN 21* 24* 23* 25* 30*  CREATININE 0.78 0.81 0.83 0.73 0.80  CALCIUM 9.7 9.1 9.6 9.3  9.0 9.2  MG  --   --   --  1.8  --  PHOS  --   --   --  3.2  --    Liver Function Tests:  Recent Labs Lab 12/26/15 0245 12/27/15 1114  AST 28 26  ALT 42 43  ALKPHOS 104 104  BILITOT 1.0 0.7  PROT 8.3* 8.4*  ALBUMIN 2.0* 2.1*   No results for input(s): LIPASE, AMYLASE in the last 168 hours. No results for input(s): AMMONIA in the last 168 hours. CBC:  Recent Labs Lab 12/23/15 2152 12/27/15 1114 12/28/15 0726 12/29/15 0604  WBC 8.4 7.2 7.7 6.9  NEUTROABS 6.3 5.9  --   --   HGB 12.0* 10.6* 10.6* 10.4*    HCT 37.7* 33.7* 33.4* 32.6*  MCV 71.9* 70.4* 71.7* 72.3*  PLT 240 284 259 236   Cardiac Enzymes: No results for input(s): CKTOTAL, CKMB, CKMBINDEX, TROPONINI in the last 168 hours. BNP: Invalid input(s): POCBNP CBG:  Recent Labs Lab 12/28/15 2129 12/29/15 0617 12/29/15 0830 12/29/15 1114 12/29/15 1235  GLUCAP 111* 98 82 82 95    Recent Results (from the past 240 hour(s))  Surgical pcr screen     Status: None   Collection Time: 12/29/15  8:38 AM  Result Value Ref Range Status   MRSA, PCR NEGATIVE NEGATIVE Final   Staphylococcus aureus NEGATIVE NEGATIVE Final    Comment:        The Xpert SA Assay (FDA approved for NASAL specimens in patients over 24 years of age), is one component of a comprehensive surveillance program.  Test performance has been validated by [facility] for patients greater than or equal to 20 year old. It is not intended to diagnose infection nor to guide or monitor treatment.   Gram stain     Status: None   Collection Time: 12/29/15  9:58 AM  Result Value Ref Range Status   Specimen Description FLUID SYNOVIAL LEFT KNEE  Final   Special Requests NONE  Final   Gram Stain   Final    FEW WBC PRESENT, PREDOMINANTLY MONONUCLEAR NO ORGANISMS SEEN    Report Status 12/29/2015 FINAL  Final  Gram stain     Status: None   Collection Time: 12/29/15 10:00 AM  Result Value Ref Range Status   Specimen Description FLUID SYNOVIAL RIGHT KNEE  Final   Special Requests NONE  Final   Gram Stain   Final    FEW WBC PRESENT,BOTH PMN AND MONONUCLEAR NO ORGANISMS SEEN    Report Status 12/29/2015 FINAL  Final     Studies:              All Imaging reviewed and is as per above notation   Scheduled Meds: . atenolol  50 mg Oral Daily  .  ceFAZolin (ANCEF) IV  1 g Intravenous 3 times per day  . colchicine  0.6 mg Oral Daily  . enoxaparin (LOVENOX) injection  40 mg Subcutaneous Daily  . HYDROmorphone      . insulin aspart  0-15 Units Subcutaneous TID WC  .  insulin aspart  3 Units Subcutaneous TID WC  . insulin glargine  25 Units Subcutaneous QHS  . ketorolac  15 mg Intravenous 3 times per day on Sun Mon Tue Wed Thu Sat  . pantoprazole  40 mg Oral Q1200  . senna-docusate  1 tablet Oral BID  . Vitamin D (Ergocalciferol)  50,000 Units Oral Q7 days   Continuous Infusions: . lactated ringers 50 mL/hr at 12/29/15 8032     Domenic Polite, MD  [facility] Pager [phone] 12/29/2015, 2:20  PM    LOS: 0 days

## 2015-12-29 NOTE — Clinical Social Work Note (Addendum)
CSW spoke with patient and his girlfriend to present bed offers.  Patient agreed to go to SNF for short term rehab, patient will be going to [facility] once he is medically ready for discharge and orders have been received.  CSW contacted Fransisco Beau at [facility] at [phone] to find out if they can admit over the weekend, he said yes if patient is ready they can accept him.  CSW to continue to follow patient's progress.  [name]. [place], MSW, [place] 12/29/2015 5:37 PM

## 2015-12-29 NOTE — Anesthesia Postprocedure Evaluation (Signed)
Anesthesia Post Note  Patient: [name]  Procedure(s) Performed: Procedure(s) (LRB): ARTHROSCOPY KNEE BILATERAL WITH  PARTIAL MEDIAL AND LATERAL MENISCECTOMY AND FOUR COMPARTMENT SYNOVECTOMY , CHONDRALPLASTIES, PATELLA-FEMORAL CHONDRALPLASTIES BILATERALLY (Bilateral)  Patient location during evaluation: PACU Anesthesia Type: General Level of consciousness: awake Vital Signs Assessment: post-procedure vital signs reviewed and stable Respiratory status: spontaneous breathing Cardiovascular status: stable Anesthetic complications: no    Last Vitals:  Filed Vitals:   12/29/15 1130 12/29/15 1145  BP: 147/80 154/93  Pulse: 54 58  Temp:    Resp: 10 13    Last Pain:  Filed Vitals:   12/29/15 1147  PainSc: Asleep                 EDWARDS,Samadhi Mahurin

## 2015-12-29 NOTE — Progress Notes (Signed)
Physical Therapy Treatment Patient Details Name: [name] MRN: [id number] DOB: 1961/02/01 Today's Date: 12/29/2015    History of Present Illness 55 y.o. 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 2/10 s/p bil knee scope PMH significant for HTN, DM type II, HLD, gout, and septic arthritis of R elbow.    PT Comments    Pt with increased mobility and ROm with bil knees today s/p bil knee scope. Pt continues to have limited strength and ROM but able to stand and pivot OOB as well as perform grossly 30degrees flexion bil knees. Pt educated for HEP with encouragement to perform even with assist of belt throughout the day as well as to increase mobility with nursing staff. Goals updated but  D/C plans remains the same. Will continue to follow.   Follow Up Recommendations  SNF;Supervision/Assistance - 24 hour     Equipment Recommendations  Wheelchair (measurements PT);Rolling walker with 5" wheels;Wheelchair cushion (measurements PT)    Recommendations for Other Services       Precautions / Restrictions Precautions Precautions: Fall Restrictions Weight Bearing Restrictions: Yes RLE Weight Bearing: Weight bearing as tolerated LLE Weight Bearing: Weight bearing as tolerated    Mobility  Bed Mobility Overal bed mobility: Modified Independent             General bed mobility comments: mod I with rail and HOB 20 degrees  Transfers Overall transfer level: Needs assistance   Transfers: Sit to/from Stand Sit to Stand: Mod assist;+2 physical assistance;From elevated surface Stand pivot transfers: Min assist;+2 physical assistance       General transfer comment: pt required elevated bed with cues for sequence, anterior translation and 2 attempts before able to rise from surface, minguard to control descent to chair. Min assist with cues for posture and sequence with small pivotal steps to chair with assist to direct RW  Ambulation/Gait             General Gait Details: pt denied attempting further than pivot   Stairs            Wheelchair Mobility    Modified Rankin (Stroke Patients Only)       Balance                                    Cognition Arousal/Alertness: Awake/alert Behavior During Therapy: WFL for tasks assessed/performed Overall Cognitive Status: Within Functional Limits for tasks assessed                      Exercises General Exercises - Lower Extremity Ankle Circles/Pumps: AROM;Seated;Both;10 reps Short Arc Quad: AAROM;Both;10 reps Hip ABduction/ADduction: AAROM;Both;10 reps Straight Leg Raises: AAROM;Both;10 reps    General Comments        Pertinent Vitals/Pain Pain Score: 5  Pain Location: bil knees Pain Descriptors / Indicators: Throbbing;Sore Pain Intervention(s): Limited activity within patient's tolerance;Monitored during session;Repositioned;Premedicated before session;Ice applied    Home Living                      Prior Function            PT Goals (current goals can now be found in the care plan section) Acute Rehab PT Goals Patient Stated Goal: to be able to walk  PT Goal Formulation: With patient Time For Goal Achievement: 01/12/16 Potential to Achieve Goals: Fair Progress towards PT goals:  Goals met and updated - see care plan    Frequency       PT Plan Current plan remains appropriate    Co-evaluation             End of Session Equipment Utilized During Treatment: Gait belt Activity Tolerance: Patient tolerated treatment well Patient left: in chair;with call bell/phone within reach     Time: 2241-1464 PT Time Calculation (min) (ACUTE ONLY): 21 min  Charges:  $Therapeutic Activity: 8-22 mins                    G Codes:      Melford Aase Jan 10, 2016, 2:08 PM Elwyn Reach, [place]

## 2015-12-29 NOTE — Clinical Social Work Placement (Signed)
   CLINICAL SOCIAL WORK PLACEMENT  NOTE  Date:  12/29/2015  Patient Details  Name: [name] MRN: [id number] Date of Birth: [birth year]  Clinical Social Work is seeking post-discharge placement for this patient at the [place] level of care (*CSW will initial, date and re-position this form in  chart as items are completed):  Yes   Patient/family provided with [facility] Work Department's list of facilities offering this level of care within the geographic area requested by the patient (or if unable, by the patient's family).  Yes   Patient/family informed of their freedom to choose among providers that offer the needed level of care, that participate in Medicare, Medicaid or managed care program needed by the patient, have an available bed and are willing to accept the patient.  Yes   Patient/family informed of [facility]'s ownership interest in [facility] and [facility], as well as of the fact that they are under no obligation to receive care at these facilities.  PASRR submitted to EDS on 12/29/15     PASRR number received on 12/29/15     Existing PASRR number confirmed on       FL2 transmitted to all facilities in geographic area requested by pt/family on 12/29/15     FL2 transmitted to all facilities within larger geographic area on 12/29/15     Patient informed that his/her managed care company has contracts with or will negotiate with certain facilities, including the following:        Yes   Patient/family informed of bed offers received.  Patient chooses bed at [facility] recommends and patient chooses bed at      Patient to be transferred to [facility] on  .  Patient to be transferred to facility by       Patient family notified on   of transfer.  Name of family member notified:        PHYSICIAN Please sign FL2     Additional Comment:     _______________________________________________ Ross Ludwig, LCSWA 12/29/2015, 6:33 PM

## 2015-12-29 NOTE — NC FL2 (Signed)
[place] LEVEL OF CARE SCREENING TOOL     IDENTIFICATION  Patient Name: [name] Birthdate: [birth year] Sex: male Admission Date (Current Location): 12/23/2015  [facility] and [place] Number:  Herbalist and Address:  [facility] [place], [place] [address] [place], [place] [address]      Provider Number: [id number]  Attending Physician Name and Address:  Domenic Polite, MD  Relative Name and Phone Number:  [name], Mother [phone]    Current Level of Care: Hospital Recommended Level of Care: [place] Prior Approval Number:    Date Approved/Denied:   PASRR Number:    Discharge Plan: SNF    Current Diagnoses: Patient Active Problem List   Diagnosis Date Noted  . Acute meniscal tear of right knee 12/29/2015  . Acute meniscal tear of left knee 12/29/2015  . Other specified crystal arthropathies, left knee 12/29/2015  . Other specified crystal arthropathies, right knee 12/29/2015  . Arthritis of knee   . Inability to ambulate due to knee   . Microcytic anemia   . Bilateral knee pain 12/23/2015  . Hypertension 12/23/2015  . Chronic mid back pain 12/23/2015  . Gout 12/23/2015  . Osteoarthritis of both knees 12/23/2015  . Intractable pain 12/23/2015  . Normocytic anemia 12/23/2015  . Gouty bursitis, elbow  10/18/2015  . IDDM (insulin dependent diabetes mellitus) ([place]) 10/18/2015    Orientation RESPIRATION BLADDER Height & Weight     Self, Time, Situation, Place  O2 (2L per minute) Continent Weight: 238 lb (107.956 kg) Height:  5\' 5"  (165.1 cm)  BEHAVIORAL SYMPTOMS/MOOD NEUROLOGICAL BOWEL NUTRITION STATUS      Continent Diet  AMBULATORY STATUS COMMUNICATION OF NEEDS Skin   Extensive Assist Verbally Surgical wounds                       Personal Care Assistance Level of Assistance  Bathing, Dressing Bathing Assistance: Limited assistance   Dressing Assistance: Limited assistance      Functional Limitations Info             SPECIAL CARE FACTORS FREQUENCY  PT (By licensed PT)     PT Frequency: 5x a day              Contractures      Additional Factors Info  Code Status, Allergies, Insulin Sliding Scale Code Status Info: Full Code Allergies Info: NKA   Insulin Sliding Scale Info: 3x a day       Current Medications (12/29/2015):  This is the current hospital active medication list Current Facility-Administered Medications  Medication Dose Route Frequency Provider Last Rate Last Dose  . acetaminophen (TYLENOL) tablet 650 mg  650 mg Oral Q6H PRN Vianne Bulls, MD       Or  . acetaminophen (TYLENOL) suppository 650 mg  650 mg Rectal Q6H PRN Vianne Bulls, MD      . atenolol (TENORMIN) tablet 50 mg  50 mg Oral Daily Vianne Bulls, MD   50 mg at 12/28/15 1100  . bisacodyl (DULCOLAX) EC tablet 5 mg  5 mg Oral Daily PRN Vianne Bulls, MD      . ceFAZolin (ANCEF) IVPB 1 g/50 mL premix  1 g Intravenous 3 times per day Gary Fleet, PA-C      . colchicine tablet 0.6 mg  0.6 mg Oral Daily Nita Sells, MD   0.6 mg at 12/28/15 1100  . enoxaparin (LOVENOX) injection 40  mg  40 mg Subcutaneous Daily Vianne Bulls, MD   40 mg at 12/28/15 1100  . HYDROmorphone (DILAUDID) 1 MG/ML injection           . HYDROmorphone (DILAUDID) injection 1 mg  1 mg Intravenous Q2H PRN Vianne Bulls, MD   1 mg at 12/24/15 1217  . insulin aspart (novoLOG) injection 0-15 Units  0-15 Units Subcutaneous TID WC Vianne Bulls, MD   2 Units at 12/27/15 1700  . insulin aspart (novoLOG) injection 3 Units  3 Units Subcutaneous TID WC Domenic Polite, MD   3 Units at 12/28/15 1245  . insulin glargine (LANTUS) injection 25 Units  25 Units Subcutaneous QHS Vianne Bulls, MD   25 Units at 12/28/15 2202  . ketorolac (TORADOL) 30 MG/ML injection 15 mg  15 mg Intravenous 3 times per day on Sun Mon Tue Wed Thu Sat Gary Fleet, PA-C      . lactated ringers infusion   Intravenous Continuous  Finis Bud, MD 50 mL/hr at 12/29/15 [id number]    . LORazepam (ATIVAN) injection 1-2 mg  1-2 mg Intravenous Q6H PRN Nita Sells, MD      . ondansetron (ZOFRAN) tablet 4 mg  4 mg Oral Q6H PRN Vianne Bulls, MD       Or  . ondansetron (ZOFRAN) injection 4 mg  4 mg Intravenous Q6H PRN Vianne Bulls, MD      . oxyCODONE-acetaminophen (PERCOCET/ROXICET) 5-325 MG per tablet 1-2 tablet  1-2 tablet Oral Q3H PRN Gary Fleet, PA-C   2 tablet at 12/28/15 2202  . pantoprazole (PROTONIX) EC tablet 40 mg  40 mg Oral Q1200 Domenic Polite, MD   40 mg at 12/28/15 1235  . senna-docusate (Senokot-S) tablet 1 tablet  1 tablet Oral QHS PRN Vianne Bulls, MD      . senna-docusate (Senokot-S) tablet 1 tablet  1 tablet Oral BID Domenic Polite, MD   1 tablet at 12/28/15 2202  . Vitamin D (Ergocalciferol) (DRISDOL) capsule 50,000 Units  50,000 Units Oral Q7 days Ainsley Spinner, PA-C   50,000 Units at 12/26/15 1233     Discharge Medications: Please see discharge summary for a list of discharge medications.  Relevant Imaging Results:  Relevant Lab Results:   Additional Information SS#: [id number]  Anell Barr

## 2015-12-30 LAB — CREATININE, SERUM
Creatinine, Ser: 0.67 mg/dL (ref 0.61–1.24)
GFR calc Af Amer: >60 mL/min (ref >60)
GFR calc non Af Amer: >60 mL/min (ref >60)

## 2015-12-30 LAB — CBC
HCT: 33.2 % — ABNORMAL LOW (ref 39.0–52.0)
Hemoglobin: 10.3 g/dL — ABNORMAL LOW (ref 13.0–17.0)
MCH: 22.7 pg — ABNORMAL LOW (ref 26.0–34.0)
MCHC: 31 g/dL (ref 30.0–36.0)
MCV: 73.3 fL — ABNORMAL LOW (ref 78.0–100.0)
Platelets: 209 10*3/uL (ref 150–400)
RBC: 4.53 MIL/uL (ref 4.22–5.81)
RDW: 16.7 % — ABNORMAL HIGH (ref 11.5–15.5)
WBC: 7.4 10*3/uL (ref 4.0–10.5)

## 2015-12-30 LAB — BASIC METABOLIC PANEL
Anion gap: 9 (ref 5–15)
BUN: 27 mg/dL — ABNORMAL HIGH (ref 6–20)
CO2: 23 mmol/L (ref 22–32)
Calcium: 8.6 mg/dL — ABNORMAL LOW (ref 8.9–10.3)
Chloride: 105 mmol/L (ref 101–111)
Creatinine, Ser: 0.8 mg/dL (ref 0.61–1.24)
GFR calc Af Amer: >60 mL/min (ref >60)
GFR calc non Af Amer: >60 mL/min (ref >60)
Glucose, Bld: 103 mg/dL — ABNORMAL HIGH (ref 65–99)
Potassium: 4.3 mmol/L (ref 3.5–5.1)
Sodium: 137 mmol/L (ref 135–145)

## 2015-12-30 LAB — GLUCOSE, CAPILLARY
Glucose-Capillary: 95 mg/dL (ref 65–99)
Glucose-Capillary: 99 mg/dL (ref 65–99)
Glucose-Capillary: 135 mg/dL — ABNORMAL HIGH (ref 65–99)

## 2015-12-30 MED ORDER — PREDNISONE 20 MG PO TABS
50.0000 mg | ORAL_TABLET | Freq: Every day | ORAL | Status: DC
  Administered 2015-12-30 – 2015-12-31 (×2): 50 mg via ORAL
  Filled 2015-12-30 (×2): qty 2

## 2015-12-30 NOTE — Op Note (Signed)
NAMECLERANCE, UZZLE                ACCOUNT NO.:  [id number]  MEDICAL RECORD NO.:  [id number]  LOCATION:  5N15C                        FACILITY:  [facility]  PHYSICIAN:  Alta Corning, M.D.   DATE OF BIRTH:  Jul 18, 1961  DATE OF PROCEDURE:  12/29/2015 DATE OF DISCHARGE:                              OPERATIVE REPORT   PREOPERATIVE DIAGNOSIS:  Bilateral severe knee arthritis with unrelenting pain and failure of cortisone injection and IV steroids to help ease or modify pain in a patient, who suddenly became nonambulatory.  POSTOPERATIVE DIAGNOSES: 1. Severe chondromalacia tricompartmental, right and left knees. 2. Severe synovial proliferation, all compartments right and left     knees. 3. Medial and lateral meniscal tearing, right and left knees.  PROCEDURE: 1. Left knee arthroscopy with partial medial and partial lateral     meniscectomy. 2. Left knee arthroscopy with 4-compartment synovectomy. 3. Left knee arthroscopy with chondroplasty of the medial, lateral,     and patellofemoral joints. 4. Right knee arthroscopy with medial and lateral meniscal     debridement. 5. Right knee arthroscopy with 4-compartment synovectomy. 6. Right knee arthroscopy with chondroplasty medial, lateral, and     patellofemoral joints.  BRIEF HISTORY:  Mr. Grubert is a 55 year old male, who presented to the hospital about a week ago with sudden onset of severe and unrelenting pain in bilateral knees and inability to walk or bend his knees.  He was admitted to the medical service and Orthopedics was consulted.  Trauma surgeon was evaluating him and got [place] involved because he was not certain exactly what the diagnosis was or where to go at a very high sed rate and CRP, but his knees had no warmth, erythema, or effusion.  He had tremendous difficulty bending his knees.  He had tremendous difficulty standing.  Cortisone injections were performed bilaterally with minimal success.  We repeated the cortisone  injections bilateral just to make sure that we got a good dose and appropriate location, and this again gave him minimal relief.  The patient did not have a white count, did not have a fever, did not look sick, but continued to complain of severe and unrelenting pain.  We felt that a gadolinium- enhanced MRI bilateral knees would be the appropriate course of action just to make sure there was not something untoward going on either mechanically or in terms of infection.  The patient refused to have MRIs performed which showed and left [place] in a difficult situation as to what was his diagnosis and what was going on.  At that point, I had a discussion with the patient and his girlfriend about treatment options. I felt that it may be reasonable to pursue arthroscopic intervention just to see if we could one helped him, but to aid and trying to make a diagnosis.  After a long discussion of understanding that we would probably not hurt him although success in the case was certainly no guarantee.  We felt that we gained some diagnostic ability he was accepting and desirous of proceeding with arthroscopy.  He was brought to the operating room for this procedure.  DESCRIPTION OF PROCEDURE:  The patient was brought to the operating  room.  After adequate anesthesia obtained with general anesthetic, the patient was placed supine on the operating table.  Both knees were then evaluated under anesthesia.  They did come within about 10 degrees of full extension, but with gentle manipulation, we were able to come straight both sides.  Left knee was flexing to about 90, but with manipulation and some breakdown of scar tissue, went to about 125 degrees.  Left knee was stopped at about 80 and then with manipulation, it went to about 125 degrees.  Once that was done, it easily went back through that range of motion.  At this point, both legs were prepped and draped in usual sterile fashion.  Attention was  turned to the left knee. Initially, a routine arthroscopic examination revealed that there was significant synovitic reaction superiorly, anteriorly, medially, and laterally.  Four-compartment synovectomy was performed and 6 L of fluid was instilled throughout the knee during the arthroscopic procedure.  As we were able to get some of the synovium broken down, we could see there was significant chondromalacia of the patellofemoral compartment with significant crystalline deposition.  We debrided this thoroughly and went into the medial compartment, where there was significant chondromalacia which was debrided.  Medial meniscus was torn.  We debrided this with a straight biting forceps and meniscal rim was contoured down with the suction shaver.  Went anteriorly, where there was significant synovium.  Took some synovial biopsies to sent to path. Went laterally, where there was significant synovial reaction.  There was a lateral meniscal tear and there was significant chondromalacia in the lateral compartment.  We debrided the meniscus tear and the chondromalacia in the lateral compartment.  Once this was completed, we then turned back to the knee, where we did a thorough irrigation and continued debridement of the synovial proliferation within the knee, and this went for 6 L of irrigant fluid and probably over about 25-minute period of time.  Once we were satisfied that we had debrided this knee adequately and thoroughly and taken down.  There were some pretty significant bands up in the suprapatellar pouch.  We then removed the instruments from the left knee.  Of note when we put the instruments in the left knee, we got some fluid maybe 5 mL of bloody type fluid.  We sent this for Gram stain, culture, crystals and AFB and fungal cultures. We then, at this point, turned to the left knee.  Upon introducing instruments into the left knee, we got about 8 or 10 mL of bloody-type fluid.  This  was sent for gram stain, culture, crystals, and AFB and fungal cultures.  We then did a routine arthroscopic examination of the right knee.  Left knee was done first.  The right knee which showed that there was proliferative synovium in the superior pouch which was debrided.  There was chondromalacia in the patellofemoral joint which was debrided.  We went over medially, where the medial gutter was debrided thoroughly to gain entrance into the medial compartment, where the medial meniscus had evidence of fraying and tearing and the medial femoral condyle had some chondromalacia.  There was some synovium which had burred into the articular surface, and we got some of this and sent to path as well.  We debrided out this medial compartment, went centrally and debrided this out, went laterally and did a debridement. There was an anterior horn lateral meniscal tear which was debrided as well as some chondromalacia full-thickness in the lateral compartment. This  was debrided thoroughly, went back up in the lateral gutter and debrided this as well, so essentially 4-compartment synovectomy and chondroplasty of all compartments, medial and lateral meniscal debridement.  Once this was completed, we irrigated 6 L through the knee while we continued to debride and once this was completed, we were satisfied that we had done an adequate debridement and washout.  The instruments were removed.  Put stitches in each of the portals just because of the concern of hygiene and the issues related to the hyperkeratosis of both legs.  Sterile compressive dressings were applied, and the patient was taken to the recovery and noted to be in satisfactory condition.  Of note, we did not give antibiotics prior to the procedure because we wanted to send cultures.  As soon as cultures have been taken, we started the antibiotics and we will continue that for 24 hours.  At this point, the patient was taken to the recovery  room and was noted to be in satisfactory condition.  We will attempt to start physical therapy on the patient as soon as possible.     Alta Corning, M.D.     Corliss Skains  D:  12/29/2015  T:  12/30/2015  Job:  [id number]

## 2015-12-30 NOTE — Progress Notes (Signed)
[name]:[id number] DOB: 07/04/1961 DOA: 12/23/2015 PCP: Lenor Derrick, MD  Brief narrative: 55 year old male Insulin-dependent diabetes, Hypertension, Gout, Bilateral arthritis in knees-severe Was seen in the emergency room and 16th of January and given pain medication but has not been mobile and presented to the emergency room 12/23/15 with severe lower extremity pain and inability to mobilize  Orthopedics consulted and underwent Steroid injection to Knees 2/5 He was seen again and higher dose steroids injected on 2/7 Unable to undergo MRI despite 2 attempts with sedation 2/10 Bilateral arthroscopies  Assessment/Plan:  1. 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-appreciate orthopedics input -underwent Steroid injection to Knees 2/5 and then again had higher dose steroids injected 2/7 -ESR 115, CRP 14, ANa negative, RF negative, CCP negative -He has severe chronic hyperkeratosis does not look like typical Psoriasis though -was given IV decadron -now on Toradol PRN,  Naproxen -Unable to tolerate MRI -On 2/10 underwent Bilateral Arthroscopies with PARTIAL MEDIAL AND LATERAL MENISCECTOMY AND FOUR COMPARTMENT SYNOVECTOMY , CHONDRALPLASTIES, PATELLA-FEMORAL CHONDRALPLASTIES BILATERALLY  -gram stain negative, cultures negative thus far -d/w Dr.Graves will give Steroids, prednisone 57m now and slow taper as outpatient -will need Rheum FU and Ortho FU for Knee replacement -strongly advised pt again to consider Rehab for Discharge soon to prevent longterm disability, plan for DC to Rehab tomorrow  2. Insulin-dependent diabetes mellitus-usual dose of 7030 insulin 40 twice a day .  -continue lantus, SSI, cut down lantus and  meal coverage  3. Gout- stable, on colchicine   4. Severe Chronic hyperkeratosis/not typical for psoriasis, needs Derm FU -there is no Skin or Rheum service that comes to the Hospital.  Patient will need OP follow up.  5. Anemia of  chronic disease-stable, monitor  6. Hypertension continue atenolol 50 daily. mod Controlled  7. Back pain-improved.    8. Vit D def.  placed on once weekly replacement of Vit D 50,000 units  Full Code No family at the bedside Dispo: Strongly recommended SNF, DC to SNF tomorrow if stable   Consultants:  Orthopedics  Procedures:  Bilateral knee injections   Antibiotics:  None   Subjective   Still in mod amount of pain, some improvement noted, s/p OR yetserday    Objective      Objective: Filed Vitals:   12/29/15 1403 12/29/15 2112 12/29/15 2314 12/30/15 0535  BP:  111/68 108/71 133/72  Pulse:  82 72 90  Temp:  99.6 F (37.6 C) 98.7 F (37.1 C) 99 F (37.2 C)  TempSrc:  Oral Oral Oral  Resp:  '14 14 16  ' Height:      Weight:      SpO2: 100% 98% 97% 99%    Intake/Output Summary (Last 24 hours) at 12/30/15 1245 Last data filed at 12/30/15 0820  Gross per 24 hour  Intake 1022.5 ml  Output    975 ml  Net   47.5 ml    Exam:  General: AAOx3, no distress Cardiovascular: S1-S2 no murmur rub or gallop Respiratory: Clinically clear no added sound Abdomen: Soft nontender nondistended no rebound no guarding Skin moderate tenderness to both knees, swelling, injection site noted and severe hyperkeratosis of lower extremitiesll  Data Reviewed: Basic Metabolic Panel:  Recent Labs Lab 12/26/15 0245 12/27/15 1114 12/28/15 0726 12/29/15 0604 12/30/15 0003 12/30/15 0550  NA 137 137 140 139 137  --   K 3.9 3.7 3.6 4.0 4.3  --   CL 102 102 104 106 105  --   CO2 24 20*  '26 26 23  ' --   GLUCOSE 74 174* 109* 97 103*  --   BUN 24* 23* 25* 30* 27*  --   CREATININE 0.81 0.83 0.73 0.80 0.80 0.67  CALCIUM 9.1 9.6 9.3  9.0 9.2 8.6*  --   MG  --   --  1.8  --   --   --   PHOS  --   --  3.2  --   --   --    Liver Function Tests:  Recent Labs Lab 12/26/15 0245 12/27/15 1114  AST 28 26  ALT 42 43  ALKPHOS 104 104  BILITOT 1.0 0.7  PROT 8.3* 8.4*  ALBUMIN 2.0*  2.1*   No results for input(s): LIPASE, AMYLASE in the last 168 hours. No results for input(s): AMMONIA in the last 168 hours. CBC:  Recent Labs Lab 12/23/15 2152 12/27/15 1114 12/28/15 0726 12/29/15 0604 12/30/15 0550  WBC 8.4 7.2 7.7 6.9 7.4  NEUTROABS 6.3 5.9  --   --   --   HGB 12.0* 10.6* 10.6* 10.4* 10.3*  HCT 37.7* 33.7* 33.4* 32.6* 33.2*  MCV 71.9* 70.4* 71.7* 72.3* 73.3*  PLT 240 284 259 236 209   Cardiac Enzymes: No results for input(s): CKTOTAL, CKMB, CKMBINDEX, TROPONINI in the last 168 hours. BNP: Invalid input(s): POCBNP CBG:  Recent Labs Lab 12/29/15 1235 12/29/15 1627 12/29/15 2110 12/30/15 0608 12/30/15 1129  GLUCAP 95 137* 100* 95 99    Recent Results (from the past 240 hour(s))  Surgical pcr screen     Status: None   Collection Time: 12/29/15  8:38 AM  Result Value Ref Range Status   MRSA, PCR NEGATIVE NEGATIVE Final   Staphylococcus aureus NEGATIVE NEGATIVE Final    Comment:        The Xpert SA Assay (FDA approved for NASAL specimens in patients over 29 years of age), is one component of a comprehensive surveillance program.  Test performance has been validated by [facility] for patients greater than or equal to 110 year old. It is not intended to diagnose infection nor to guide or monitor treatment.   Anaerobic culture     Status: None (Preliminary result)   Collection Time: 12/29/15  9:58 AM  Result Value Ref Range Status   Specimen Description FLUID SYNOVIAL LEFT KNEE  Final   Special Requests NONE  Final   Gram Stain   Final    FEW WBC PRESENT, PREDOMINANTLY MONONUCLEAR NO ORGANISMS SEEN Performed at [facility]    Culture PENDING  Incomplete   Report Status PENDING  Incomplete  Fungus Culture with Smear     Status: None (Preliminary result)   Collection Time: 12/29/15  9:58 AM  Result Value Ref Range Status   Specimen Description FLUID SYNOVIAL LEFT KNEE  Final   Special Requests NONE  Final   Fungal Smear    Final    NO YEAST OR FUNGAL ELEMENTS SEEN Performed at [facility]    Culture   Final    CULTURE IN PROGRESS FOR FOUR WEEKS Performed at [facility]    Report Status PENDING  Incomplete  Gram stain     Status: None   Collection Time: 12/29/15  9:58 AM  Result Value Ref Range Status   Specimen Description FLUID SYNOVIAL LEFT KNEE  Final   Special Requests NONE  Final   Gram Stain   Final    FEW WBC PRESENT, PREDOMINANTLY MONONUCLEAR NO ORGANISMS SEEN  Report Status 12/29/2015 FINAL  Final  Body fluid culture     Status: None (Preliminary result)   Collection Time: 12/29/15  9:58 AM  Result Value Ref Range Status   Specimen Description FLUID SYNOVIAL LEFT KNEE  Final   Special Requests NONE  Final   Gram Stain   Final    FEW WBC PRESENT, PREDOMINANTLY MONONUCLEAR NO ORGANISMS SEEN    Culture NO GROWTH < 24 HOURS  Final   Report Status PENDING  Incomplete  Anaerobic culture     Status: None (Preliminary result)   Collection Time: 12/29/15 10:00 AM  Result Value Ref Range Status   Specimen Description FLUID SYNOVIAL RIGHT KNEE  Final   Special Requests NONE  Final   Gram Stain   Final    ABUNDANT WBC PRESENT, PREDOMINANTLY MONONUCLEAR NO ORGANISMS SEEN Performed at [facility]    Culture PENDING  Incomplete   Report Status PENDING  Incomplete  Body fluid culture     Status: None (Preliminary result)   Collection Time: 12/29/15 10:00 AM  Result Value Ref Range Status   Specimen Description FLUID SYNOVIAL RIGHT KNEE  Final   Special Requests NONE  Final   Gram Stain   Final    FEW WBC PRESENT,BOTH PMN AND MONONUCLEAR NO ORGANISMS SEEN    Culture NO GROWTH < 24 HOURS  Final   Report Status PENDING  Incomplete  Fungus Culture with Smear     Status: None (Preliminary result)   Collection Time: 12/29/15 10:00 AM  Result Value Ref Range Status   Specimen Description FLUID SYNOVIAL RIGHT KNEE  Final   Special Requests NONE  Final   Fungal  Smear   Final    NO YEAST OR FUNGAL ELEMENTS SEEN Performed at [facility]    Culture   Final    CULTURE IN PROGRESS FOR FOUR WEEKS Performed at [facility]    Report Status PENDING  Incomplete  Gram stain     Status: None   Collection Time: 12/29/15 10:00 AM  Result Value Ref Range Status   Specimen Description FLUID SYNOVIAL RIGHT KNEE  Final   Special Requests NONE  Final   Gram Stain   Final    FEW WBC PRESENT,BOTH PMN AND MONONUCLEAR NO ORGANISMS SEEN    Report Status 12/29/2015 FINAL  Final     Studies:              All Imaging reviewed and is as per above notation   Scheduled Meds: . atenolol  50 mg Oral Daily  .  ceFAZolin (ANCEF) IV  1 g Intravenous 3 times per day  . colchicine  0.6 mg Oral Daily  . enoxaparin (LOVENOX) injection  40 mg Subcutaneous Daily  . insulin aspart  0-15 Units Subcutaneous TID WC  . insulin aspart  3 Units Subcutaneous TID WC  . insulin glargine  20 Units Subcutaneous QHS  . pantoprazole  40 mg Oral Q1200  . predniSONE  50 mg Oral Q breakfast  . senna-docusate  1 tablet Oral BID  . Vitamin D (Ergocalciferol)  50,000 Units Oral Q7 days   Continuous Infusions:     Domenic Polite, MD  [facility] Pager [phone] 12/30/2015, 12:45 PM    LOS: 1 day

## 2015-12-30 NOTE — Progress Notes (Signed)
Subjective: 1 Day Post-Op Procedure(s) (LRB): ARTHROSCOPY KNEE BILATERAL WITH  PARTIAL MEDIAL AND LATERAL MENISCECTOMY AND FOUR COMPARTMENT SYNOVECTOMY , CHONDRALPLASTIES, PATELLA-FEMORAL CHONDRALPLASTIES BILATERALLY (Bilateral) Patient reports pain as moderate.  Some improvement in pain postoperatively. Has been getting out of bed to chair.  Objective: Vital signs in last 24 hours: Temp:  [98.7 F (37.1 C)-99.6 F (37.6 C)] 99 F (37.2 C) (02/11 0535) Pulse Rate:  [72-90] 90 (02/11 0535) Resp:  [14-16] 16 (02/11 0535) BP: (108-133)/(68-72) 133/72 mmHg (02/11 0535) SpO2:  [97 %-100 %] 99 % (02/11 0535)  Intake/Output from previous day: 02/10 0701 - 02/11 0700 In: 1802.5 [I.V.:1752.5; IV Piggyback:50] Out: 865 [Urine:825; Blood:40] Intake/Output this shift: Total I/O In: 120 [P.O.:120] Out: 150 [Urine:150]   Recent Labs  12/28/15 0726 12/29/15 0604 12/30/15 0550  HGB 10.6* 10.4* 10.3*    Recent Labs  12/29/15 0604 12/30/15 0550  WBC 6.9 7.4  RBC 4.51 4.53  HCT 32.6* 33.2*  PLT 236 209    Recent Labs  12/29/15 0604 12/30/15 0003 12/30/15 0550  NA 139 137  --   K 4.0 4.3  --   CL 106 105  --   CO2 26 23  --   BUN 30* 27*  --   CREATININE 0.80 0.80 0.67  GLUCOSE 97 103*  --   CALCIUM 9.2 8.6*  --    Gram stain negative. Cultures negative thus far. Bilateral knee exams: Bilateral knee dressings intact. Calves soft. Has significant hyperkeratosis of both lower extremities. Still has pain with range of motion, but improved.  Assessment/Plan: 1 Day Post-Op Procedure(s) (LRB): ARTHROSCOPY KNEE BILATERAL WITH  PARTIAL MEDIAL AND LATERAL MENISCECTOMY AND FOUR COMPARTMENT SYNOVECTOMY , CHONDRALPLASTIES, PATELLA-FEMORAL CHONDRALPLASTIES BILATERALLY (Bilateral) Plan: Start oral prednisone taper. Physical therapy. Weight-bear as tolerated bilaterally. Should be ready for discharge home or to skilled nursing facility anytime. Will need follow-up with Dr. Berenice Primas in  one week. We will continue to see while in the hospital.   Gary Fleet G 12/30/2015, 1:21 PM

## 2015-12-31 DIAGNOSIS — S83206D Unspecified tear of unspecified meniscus, current injury, right knee, subsequent encounter: Secondary | ICD-10-CM

## 2015-12-31 LAB — BASIC METABOLIC PANEL
ANION GAP: 7 (ref 5–15)
BUN: 21 mg/dL — ABNORMAL HIGH (ref 6–20)
CALCIUM: 8.7 mg/dL — AB (ref 8.9–10.3)
CHLORIDE: 104 mmol/L (ref 101–111)
CO2: 26 mmol/L (ref 22–32)
CREATININE: 0.7 mg/dL (ref 0.61–1.24)
GFR calc non Af Amer: >60 mL/min (ref >60)
Glucose, Bld: 127 mg/dL — ABNORMAL HIGH (ref 65–99)
POTASSIUM: 4.1 mmol/L (ref 3.5–5.1)
Sodium: 137 mmol/L (ref 135–145)

## 2015-12-31 LAB — SEDIMENTATION RATE: SED RATE: 95 mm/h — AB (ref 0–16)

## 2015-12-31 LAB — GLUCOSE, CAPILLARY
GLUCOSE-CAPILLARY: 98 mg/dL (ref 65–99)
GLUCOSE-CAPILLARY: 124 mg/dL — AB (ref 65–99)
GLUCOSE-CAPILLARY: 120 mg/dL — AB (ref 65–99)
Glucose-Capillary: 147 mg/dL — ABNORMAL HIGH (ref 65–99)

## 2015-12-31 LAB — CBC
HEMATOCRIT: 28.6 % — AB (ref 39.0–52.0)
Hemoglobin: 8.9 g/dL — ABNORMAL LOW (ref 13.0–17.0)
MCH: 22.3 pg — ABNORMAL LOW (ref 26.0–34.0)
MCHC: 31.1 g/dL (ref 30.0–36.0)
MCV: 71.5 fL — ABNORMAL LOW (ref 78.0–100.0)
Platelets: 197 10*3/uL (ref 150–400)
RBC: 4 MIL/uL — ABNORMAL LOW (ref 4.22–5.81)
RDW: 16.4 % — ABNORMAL HIGH (ref 11.5–15.5)
WBC: 6.8 10*3/uL (ref 4.0–10.5)

## 2015-12-31 MED ORDER — HYDROMORPHONE HCL 2 MG PO TABS
2.0000 mg | ORAL_TABLET | ORAL | Status: DC | PRN
  Administered 2016-01-01 (×2): 2 mg via ORAL
  Filled 2015-12-31: qty 1
  Filled 2015-12-31: qty 2
  Filled 2015-12-31: qty 1

## 2015-12-31 MED ORDER — HYDROMORPHONE HCL 2 MG PO TABS: 2.0000 mg | ORAL_TABLET | ORAL | Status: DC | PRN

## 2015-12-31 MED ORDER — SENNOSIDES-DOCUSATE SODIUM 8.6-50 MG PO TABS: 1.0000 | ORAL_TABLET | Freq: Two times a day (BID) | ORAL | Status: DC

## 2015-12-31 MED ORDER — BISACODYL 5 MG PO TBEC: 5.0000 mg | DELAYED_RELEASE_TABLET | Freq: Every day | ORAL | Status: DC | PRN

## 2015-12-31 MED ORDER — PREDNISONE 20 MG PO TABS: 60.0000 mg | ORAL_TABLET | Freq: Every day | ORAL | Status: DC

## 2015-12-31 MED ORDER — PREDNISONE 10 MG PO TABS
10.0000 mg | ORAL_TABLET | Freq: Once | ORAL | Status: AC
  Administered 2015-12-31: 10 mg via ORAL
  Filled 2015-12-31: qty 1

## 2015-12-31 MED ORDER — PREDNISONE 20 MG PO TABS
60.0000 mg | ORAL_TABLET | Freq: Every day | ORAL | Status: DC
  Administered 2016-01-01: 60 mg via ORAL
  Filled 2015-12-31: qty 3

## 2015-12-31 NOTE — Progress Notes (Signed)
Per MD, Pt not ready for d/c today.  Facility aware.  Bernita Raisin, [place] [facility] [phone]

## 2015-12-31 NOTE — Progress Notes (Addendum)
Subjective: 2 Days Post-Op Procedure(s) (LRB): ARTHROSCOPY KNEE BILATERAL WITH  PARTIAL MEDIAL AND LATERAL MENISCECTOMY AND FOUR COMPARTMENT SYNOVECTOMY , CHONDRALPLASTIES, PATELLA-FEMORAL CHONDRALPLASTIES BILATERALLY (Bilateral) Patient reports pain as 8 on 0-10 scale.  The patient reports that he has severe pain in both knees. He reports that he is unable to go home because of this pain. He does not think he can walk.  Objective: Vital signs in last 24 hours: Temp:  [97.3 F (36.3 C)-98.5 F (36.9 C)] 97.3 F (36.3 C) (02/12 0650) Pulse Rate:  [59-73] 59 (02/12 0650) Resp:  [16] 16 (02/12 0650) BP: (116-150)/(69-84) 150/84 mmHg (02/12 0650) SpO2:  [98 %-100 %] 98 % (02/12 0650)  Intake/Output from previous day: 02/11 0701 - 02/12 0700 In: 360 [P.O.:360] Out: 1175 [Urine:1175] Intake/Output this shift:     Recent Labs  12/29/15 0604 12/30/15 0550 12/31/15 0330  HGB 10.4* 10.3* 8.9*    Recent Labs  12/30/15 0550 12/31/15 0330  WBC 7.4 6.8  RBC 4.53 4.00*  HCT 33.2* 28.6*  PLT 209 197    Recent Labs  12/30/15 0003 12/30/15 0550 12/31/15 0330  NA 137  --  137  K 4.3  --  4.1  CL 105  --  104  CO2 23  --  26  BUN 27*  --  21*  CREATININE 0.80 0.67 0.70  GLUCOSE 103*  --  127*  CALCIUM 8.6*  --  8.7*   cultures negative. No results for input(s): LABPT, INR in the last 72 hours. Bilateral knee exams: Dressings are clean and dry. He does not have significant effusions. He does have pain with any range of motion of both knees. Both calves are soft and nontender.   Assessment/Plan: 2 Days Post-Op Procedure(s) (LRB): ARTHROSCOPY KNEE BILATERAL WITH  PARTIAL MEDIAL AND LATERAL MENISCECTOMY AND FOUR COMPARTMENT SYNOVECTOMY , CHONDRALPLASTIES, PATELLA-FEMORAL CHONDRALPLASTIES BILATERALLY (Bilateral) Seronegative DJD with crystalline deposit disease. Gout. Severe unrelenting pain. Plan: Will use oral Dilaudid for pain management. I would not use IV pain management  at this point. He reports that he does not feel like he can make it at home. Therefore he will need to go to a skilled nursing facility. He will need a follow-up with Dr. Berenice Primas in his office in 7-10 days. Continue oral steroids.   Halea Lieb G 12/31/2015, 10:08 AM

## 2015-12-31 NOTE — Discharge Summary (Addendum)
Physician Discharge Summary  [name] UXN:[id number] DOB: 12/29/1960 DOA: 12/23/2015  PCP: Lenor Derrick, MD  Admit date: 12/23/2015 Discharge date: 01/01/2016  Time spent: 45 minutes  Recommendations for Outpatient Follow-up:  1. PCP in 1 week 2. Dr.Graves, Orthopedics in 7-10days 3. 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   IDDM (insulin dependent diabetes mellitus) ([place])   Bilateral knee pain   Hypertension   Chronic mid back pain   Osteoarthritis of both knees   Normocytic anemia   Arthritis of knee   Inability to ambulate due to knee   Microcytic anemia   Acute meniscal tear of right knee   Acute meniscal tear of left knee   Other specified crystal arthropathies, left knee   Other specified crystal arthropathies, right knee   Discharge Condition: stable  Diet recommendation: Diabetic  Filed Weights   12/23/15 1935  Weight: 107.956 kg (238 lb)    History of present illness:  Chief Complaint: Disabling knee pain, bilateral   HPI: [name] is a 55 y.o. male with PMH of insulin-dependent diabetes mellitus, hypertension, chronic gout, and severe tricompartmental osteoarthritis of the bilateral knees presented to the ED with intractable bilateral knee pain, as well as subacute mid back pain, leaving patient essentially bedbound for the past 3 weeks. [name] was seen in the emergency department on 12/04/2015 with severe bilateral knee pain. Radiographs were obtained and demonstrated tricompartmental osteoarthritis of bilateral knees. His pain was adequately controlled and outpatient follow-up with orthopedic surgery was scheduled. Unfortunately, since returning home, the patient has been unable to get out of bed due to pain, therefore missed his outpatient orthopedic consultation. Patient's knee pain was been going on for many years, progressively worsening. He  has long history of working on his feet, most recently in a kitchen. He described his pain as constant, severe, achy, localized to the bilateral knees, worse with any movement, and only slightly alleviated with ice, heat, Advil, and Norco. There is been no recent trauma, no discoloration or gross deformity, and no significant swelling or erythema of the knees.  He reported being unable to walk, not due to weakness, but due to the excruciating pain that results  Hospital Course:  1. 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 -Orthopedics Dr.Graves consulted -underwent Steroid injection to Knees 2/5 and then again had higher dose steroids injected 2/7 -ESR 115, CRP 14, ANa negative, RF negative, CCP negative, Etiology unclear, could be combination of Gout and OA -He has severe chronic hyperkeratosis does not look like typical Psoriasis though -was given IV decadron and then Toradol PRN, Naproxen without much response -Unable to tolerate MRI despite 2 attempts with sedation -Then on 2/10 Dr.Graves took him to the OR, he underwent Bilateral Arthroscopies with PARTIAL MEDIAL AND LATERAL MENISCECTOMY AND FOUR COMPARTMENT SYNOVECTOMY , CHONDRALPLASTIES, PATELLA-FEMORAL CHONDRALPLASTIES BILATERALLY  -gram stain negative, cultures negative thus far -d/w Dr.Graves will give Steroids, prednisone 61m daily and slow taper as outpatient, Cut down by 173mevery 3days for now -will need Rheum FU and Ortho FU for Knee replacement -strongly advised pt to consider Rehab for Discharge soon to prevent longterm disability, plan for DC to Rehab today -[place] [facility] for RHeum FU, they will review labs/notes and call pt with FU if appropriate  2. Insulin-dependent diabetes mellitus-usual dose of 7030 insulin 40 twice a day . -continue lantus, SSI, cut down lantus and meal coverage  3. Gout- stable, on colchicine  4. Severe Chronic hyperkeratosis/not typical for psoriasis,  needs Derm FU -there is no Skin or Rheum service that comes to the Hospital. Patient will need OP follow up.  5. Anemia of chronic disease-stable, monitor  6. Hypertension continue atenolol 50 daily. mod Controlled  7. Back pain-improved.  8. Vit D def. placed on once weekly replacement of Vit D 50,000 units   Procedures:  2/5:Steroid injection to Knees  2/7: again had higher dose steroids injected 2/10 underwent Bilateral Arthroscopies with PARTIAL MEDIAL AND LATERAL MENISCECTOMY AND FOUR COMPARTMENT SYNOVECTOMY , [place], [place]   Consultations:  Ortho Dr.Graves  Discharge Exam: Filed Vitals:   12/30/15 2252 12/31/15 0650  BP: 116/77 150/84  Pulse: 71 59  Temp: 98.2 F (36.8 C) 97.3 F (36.3 C)  Resp: 16 16    General: AAOx3 Cardiovascular: S1S2/RRR Respiratory: CTAB  Discharge Instructions   Discharge Instructions    Diet Carb Modified    Complete by:  As directed      Increase activity slowly    Complete by:  As directed           Current Discharge Medication List    START taking these medications   Details  bisacodyl (DULCOLAX) 5 MG EC tablet Take 1 tablet (5 mg total) by mouth daily as needed for moderate constipation. Qty: 30 tablet, Refills: 0    HYDROmorphone (DILAUDID) 2 MG tablet Take 1-2 tablets (2-4 mg total) by mouth every 4 (four) hours as needed for severe pain. Qty: 30 tablet, Refills: 0    senna-docusate (SENOKOT-S) 8.6-50 MG tablet Take 1 tablet by mouth 2 (two) times daily.      CONTINUE these medications which have CHANGED   Details  predniSONE (DELTASONE) 20 MG tablet Take 3 tablets (60 mg total) by mouth daily with breakfast. For 1 week, then decrease by 71m every 3days      CONTINUE these medications which have NOT CHANGED   Details  atenolol (TENORMIN) 50 MG tablet Take 1 tablet (50 mg total) by mouth daily. Qty: 120 tablet, Refills: 2    colchicine 0.6 MG tablet  Take 1 tablet (0.6 mg total) by mouth daily. Qty: 30 tablet, Refills: 0    insulin aspart protamine- aspart (NOVOLOG MIX 70/30) (70-30) 100 UNIT/ML injection Inject 0.4 mLs (40 Units total) into the skin 2 (two) times daily with a meal. Qty: 10 mL, Refills: 10    metFORMIN (GLUCOPHAGE) 500 MG tablet Take 1 tablet (500 mg total) by mouth 2 (two) times daily with a meal. Qty: 60 tablet, Refills: 2    simvastatin (ZOCOR) 20 MG tablet Take 1 tablet (20 mg total) by mouth every evening. Qty: 60 tablet, Refills: 2      STOP taking these medications     HYDROcodone-acetaminophen (NORCO/VICODIN) 5-325 MG tablet      ibuprofen (ADVIL,MOTRIN) 200 MG tablet        No Known Allergies Follow-up Information    Follow up with GRAVES,JOHN L, MD. Schedule an appointment as soon as possible for a visit in 5 days.   Specialty:  Orthopedic Surgery   Contact information:   [address] [place] [phone]      Follow up with ATobie Lords MD In 1 week.   Specialty:  Rheumatology   Contact information:   [address] [phone]       The results of significant diagnostics from this  hospitalization (including imaging, microbiology, ancillary and laboratory) are listed below for reference.    Significant Diagnostic Studies: Dg Lumbar Spine 2-3 Views  12/28/2015  CLINICAL DATA:  Working up for autoimmune disease.no injury.soreness in hips/lower back EXAM: LUMBAR SPINE - 2-3 VIEW COMPARISON:  None. FINDINGS: There are moderate degenerative changes in the lumbar spine, with disc height loss and anterior osteophytes at multiple levels. No suspicious lytic or blastic lesions are identified. No acute fracture or traumatic subluxation identified. Significant amount of stool throughout belt the visualized portion of the abdomen. IMPRESSION: Degenerative disc disease. Electronically Signed   By: Nolon Nations M.D.   On: 12/28/2015 11:16    Dg Pelvis 1-2 Views  12/28/2015  CLINICAL DATA:  Working up for autoimmune disease.soreness in pelvis/lower back,no injury EXAM: PELVIS - 1-2 VIEW COMPARISON:  None. FINDINGS: There is no evidence of pelvic fracture or diastasis. No pelvic bone lesions are seen. SI joints have a normal appearance. There is mild degenerative change in the hips bilaterally. IMPRESSION: No evidence for acute  abnormality. Electronically Signed   By: Nolon Nations M.D.   On: 12/28/2015 11:13   Dg Knee Complete 4 Views Left  12/04/2015  CLINICAL DATA:  One week history of bilateral knee pain. EXAM: LEFT KNEE - COMPLETE 4+ VIEW; RIGHT KNEE - COMPLETE 4+ VIEW COMPARISON:  Radiographs 12/19/2009 and 05/04/2008 FINDINGS: Bilateral knees: Progressive and significant tricompartmental degenerative changes involving both knees. There is joint space narrowing, osteophytic spurring and subchondral cystic change. No chondrocalcinosis. Bilateral joint effusions. No acute fracture or osteochondral lesion. 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 No acute fracture. Bilateral joint effusions. Electronically Signed   By: Marijo Sanes M.D.   On: 12/04/2015 16:35   Dg Knee Complete 4 Views Right  12/04/2015  CLINICAL DATA:  One week history of bilateral knee pain. EXAM: LEFT KNEE - COMPLETE 4+ VIEW; RIGHT KNEE - COMPLETE 4+ VIEW COMPARISON:  Radiographs 12/19/2009 and 05/04/2008 FINDINGS: Bilateral knees: Progressive and significant tricompartmental degenerative changes involving both knees. There is joint space narrowing, osteophytic spurring and subchondral cystic change. No chondrocalcinosis. Bilateral joint effusions. No acute fracture or osteochondral lesion. 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 No acute fracture. Bilateral joint effusions. Electronically Signed   By: Marijo Sanes M.D.   On: 12/04/2015 16:35    Microbiology: Recent  Results (from the past 240 hour(s))  Surgical pcr screen     Status: None   Collection Time: 12/29/15  8:38 AM  Result Value Ref Range Status   MRSA, PCR NEGATIVE NEGATIVE Final   Staphylococcus aureus NEGATIVE NEGATIVE Final    Comment:        The Xpert SA Assay (FDA approved for NASAL specimens in patients over 28 years of age), is one component of a comprehensive surveillance program.  Test performance has been validated by [facility] for patients greater than or equal to 66 year old. It is not intended to diagnose infection nor to guide or monitor treatment.   Anaerobic culture     Status: None (Preliminary result)   Collection Time: 12/29/15  9:58 AM  Result Value Ref Range Status   Specimen Description FLUID SYNOVIAL LEFT KNEE  Final   Special Requests NONE  Final   Gram Stain   Final    FEW WBC PRESENT, PREDOMINANTLY MONONUCLEAR NO ORGANISMS SEEN Performed at [facility]    Culture   Final    NO ANAEROBES ISOLATED; CULTURE IN PROGRESS FOR 5 DAYS  Performed at [facility]    Report Status PENDING  Incomplete  Fungus Culture with Smear     Status: None (Preliminary result)   Collection Time: 12/29/15  9:58 AM  Result Value Ref Range Status   Specimen Description FLUID SYNOVIAL LEFT KNEE  Final   Special Requests NONE  Final   Fungal Smear   Final    NO YEAST OR FUNGAL ELEMENTS SEEN Performed at [facility]    Culture   Final    CULTURE IN PROGRESS FOR FOUR WEEKS Performed at [facility]    Report Status PENDING  Incomplete  Gram stain     Status: None   Collection Time: 12/29/15  9:58 AM  Result Value Ref Range Status   Specimen Description FLUID SYNOVIAL LEFT KNEE  Final   Special Requests NONE  Final   Gram Stain   Final    FEW WBC PRESENT, PREDOMINANTLY MONONUCLEAR NO ORGANISMS SEEN    Report Status 12/29/2015 FINAL  Final  Body fluid culture     Status: None (Preliminary result)   Collection Time: 12/29/15  9:58  AM  Result Value Ref Range Status   Specimen Description FLUID SYNOVIAL LEFT KNEE  Final   Special Requests NONE  Final   Gram Stain   Final    FEW WBC PRESENT, PREDOMINANTLY MONONUCLEAR NO ORGANISMS SEEN    Culture NO GROWTH 2 DAYS  Final   Report Status PENDING  Incomplete  AFB culture with smear (NOT at [facility])     Status: None (Preliminary result)   Collection Time: 12/29/15  9:58 AM  Result Value Ref Range Status   Specimen Description FLUID SYNOVIAL LEFT KNEE  Final   Special Requests NONE  Final   Acid Fast Smear   Final    NO ACID FAST BACILLI SEEN Performed at [facility]    Culture   Final    CULTURE WILL BE EXAMINED FOR 6 WEEKS BEFORE ISSUING A FINAL REPORT Performed at [facility]    Report Status PENDING  Incomplete  Anaerobic culture     Status: None (Preliminary result)   Collection Time: 12/29/15 10:00 AM  Result Value Ref Range Status   Specimen Description FLUID SYNOVIAL RIGHT KNEE  Final   Special Requests NONE  Final   Gram Stain   Final    ABUNDANT WBC PRESENT, PREDOMINANTLY MONONUCLEAR NO ORGANISMS SEEN Performed at [facility]    Culture   Final    NO ANAEROBES ISOLATED; CULTURE IN PROGRESS FOR 5 DAYS Performed at [facility]    Report Status PENDING  Incomplete  Body fluid culture     Status: None (Preliminary result)   Collection Time: 12/29/15 10:00 AM  Result Value Ref Range Status   Specimen Description FLUID SYNOVIAL RIGHT KNEE  Final   Special Requests NONE  Final   Gram Stain   Final    FEW WBC PRESENT,BOTH PMN AND MONONUCLEAR NO ORGANISMS SEEN    Culture NO GROWTH 2 DAYS  Final   Report Status PENDING  Incomplete  Fungus Culture with Smear     Status: None (Preliminary result)   Collection Time: 12/29/15 10:00 AM  Result Value Ref Range Status   Specimen Description FLUID SYNOVIAL RIGHT KNEE  Final   Special Requests NONE  Final   Fungal Smear   Final    NO YEAST OR FUNGAL ELEMENTS  SEEN Performed at [facility]  Final    CULTURE IN PROGRESS FOR FOUR WEEKS Performed at [facility]    Report Status PENDING  Incomplete  Gram stain     Status: None   Collection Time: 12/29/15 10:00 AM  Result Value Ref Range Status   Specimen Description FLUID SYNOVIAL RIGHT KNEE  Final   Special Requests NONE  Final   Gram Stain   Final    FEW WBC PRESENT,BOTH PMN AND MONONUCLEAR NO ORGANISMS SEEN    Report Status 12/29/2015 FINAL  Final  AFB culture with smear (NOT at [facility])     Status: None (Preliminary result)   Collection Time: 12/29/15 10:00 AM  Result Value Ref Range Status   Specimen Description FLUID SYNOVIAL RIGHT KNEE  Final   Special Requests NONE  Final   Acid Fast Smear   Final    NO ACID FAST BACILLI SEEN Performed at [facility]    Culture   Final    CULTURE WILL BE EXAMINED FOR 6 WEEKS BEFORE ISSUING A FINAL REPORT Performed at [facility]    Report Status PENDING  Incomplete     Labs: Basic Metabolic Panel:  Recent Labs Lab 12/27/15 1114 12/28/15 0726 12/29/15 0604 12/30/15 0003 12/30/15 0550 12/31/15 0330  NA 137 140 139 137  --  137  K 3.7 3.6 4.0 4.3  --  4.1  CL 102 104 106 105  --  104  CO2 20* '26 26 23  ' --  26  GLUCOSE 174* 109* 97 103*  --  127*  BUN 23* 25* 30* 27*  --  21*  CREATININE 0.83 0.73 0.80 0.80 0.67 0.70  CALCIUM 9.6 9.3  9.0 9.2 8.6*  --  8.7*  MG  --  1.8  --   --   --   --   PHOS  --  3.2  --   --   --   --    Liver Function Tests:  Recent Labs Lab 12/26/15 0245 12/27/15 1114  AST 28 26  ALT 42 43  ALKPHOS 104 104  BILITOT 1.0 0.7  PROT 8.3* 8.4*  ALBUMIN 2.0* 2.1*   No results for input(s): LIPASE, AMYLASE in the last 168 hours. No results for input(s): AMMONIA in the last 168 hours. CBC:  Recent Labs Lab 12/27/15 1114 12/28/15 0726 12/29/15 0604 12/30/15 0550 12/31/15 0330  WBC 7.2 7.7 6.9 7.4 6.8  NEUTROABS 5.9  --   --   --   --   HGB 10.6*  10.6* 10.4* 10.3* 8.9*  HCT 33.7* 33.4* 32.6* 33.2* 28.6*  MCV 70.4* 71.7* 72.3* 73.3* 71.5*  PLT 284 259 236 209 197   Cardiac Enzymes: No results for input(s): CKTOTAL, CKMB, CKMBINDEX, TROPONINI in the last 168 hours. BNP: BNP (last 3 results) No results for input(s): BNP in the last 8760 hours.  ProBNP (last 3 results) No results for input(s): PROBNP in the last 8760 hours.  CBG:  Recent Labs Lab 12/30/15 0608 12/30/15 1129 12/30/15 1619 12/31/15 0648 12/31/15 1214  GLUCAP 95 99 135* 98 124*       Signed:  Hadden Steig MD.  [facility] 12/31/2015, 1:53 PM

## 2016-01-01 ENCOUNTER — Encounter (HOSPITAL_COMMUNITY): Payer: Self-pay | Admitting: Orthopedic Surgery

## 2016-01-01 LAB — BODY FLUID CULTURE
CULTURE: NO GROWTH
CULTURE: NO GROWTH

## 2016-01-01 LAB — GLUCOSE, CAPILLARY
Glucose-Capillary: 155 mg/dL — ABNORMAL HIGH (ref 65–99)
Glucose-Capillary: 79 mg/dL (ref 65–99)
Glucose-Capillary: 105 mg/dL — ABNORMAL HIGH (ref 65–99)
Glucose-Capillary: 190 mg/dL — ABNORMAL HIGH (ref 65–99)

## 2016-01-01 LAB — CBC
HCT: 28.4 % — ABNORMAL LOW (ref 39.0–52.0)
Hemoglobin: 8.7 g/dL — ABNORMAL LOW (ref 13.0–17.0)
MCH: 21.9 pg — AB (ref 26.0–34.0)
MCHC: 30.6 g/dL (ref 30.0–36.0)
MCV: 71.5 fL — AB (ref 78.0–100.0)
PLATELETS: 213 10*3/uL (ref 150–400)
RBC: 3.97 MIL/uL — ABNORMAL LOW (ref 4.22–5.81)
RDW: 16.6 % — AB (ref 11.5–15.5)
WBC: 7.2 10*3/uL (ref 4.0–10.5)

## 2016-01-01 LAB — LYME, WESTERN BLOT, SERUM (REFLEXED)
IGG P28 AB.: ABSENT
IGM P23 AB.: ABSENT
IGM P41 AB.: ABSENT
IgG P18 Ab.: ABSENT
IgG P30 Ab.: ABSENT
IgG P39 Ab.: ABSENT
IgG P41 Ab.: ABSENT
IgG P93 Ab.: ABSENT
IgM P39 Ab.: ABSENT
LYME IGG WB: NEGATIVE
LYME IGM WB: NEGATIVE

## 2016-01-01 LAB — BASIC METABOLIC PANEL
Anion gap: 6 (ref 5–15)
BUN: 22 mg/dL — AB (ref 6–20)
CHLORIDE: 106 mmol/L (ref 101–111)
CO2: 24 mmol/L (ref 22–32)
CREATININE: 0.71 mg/dL (ref 0.61–1.24)
Calcium: 8.8 mg/dL — ABNORMAL LOW (ref 8.9–10.3)
GFR calc Af Amer: >60 mL/min (ref >60)
GFR calc non Af Amer: >60 mL/min (ref >60)
Glucose, Bld: 94 mg/dL (ref 65–99)
Potassium: 3.8 mmol/L (ref 3.5–5.1)
SODIUM: 136 mmol/L (ref 135–145)

## 2016-01-01 LAB — HLA-B27 ANTIGEN: HLA-B27: NEGATIVE

## 2016-01-01 LAB — TESTOSTERONE, % FREE: Testosterone-% Free: 2.2 % — ABNORMAL HIGH (ref 0.2–0.7)

## 2016-01-01 LAB — B. BURGDORFI ANTIBODIES: B BURGDORFERI AB IGG+ IGM: 1.31 {ISR} — AB (ref 0.00–0.90)

## 2016-01-01 NOTE — Progress Notes (Signed)
Report called to [facility]

## 2016-01-01 NOTE — Clinical Social Work Note (Addendum)
CSW notified [facility] that patient should be ready for discharge today, SNF confirmed they can take patient today.  CSW to arrange transportation for patient, patient to be transferred via EMS transportation via [place].  Patient has notified his family members.  Patient to be d/c'ed today to [facility].  RN to call report to [phone].  Jones Broom. [place], MSW, [place] 01/01/2016 11:33 AM

## 2016-01-01 NOTE — Progress Notes (Signed)
Physical Therapy Treatment Patient Details Name: [name] MRN: [id number] DOB: 18-Oct-1961 Today's Date: 01/01/2016    History of Present Illness 55 y.o. 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 2/10 s/p bil knee scope PMH significant for HTN, DM type II, HLD, gout, and septic arthritis of R elbow.    PT Comments    Pt in chair on arrival initially stating he has moved back and forth bed to chair over the weekend and then stated he has been up in chair for several days. Pt with increased bil LE ROM and HEP today but decreased ability to perform sit to stand trials and currently will require maximove transfer for bed to chair with nursing notified. Pt with education for need to maintain mobility and not static position on any surface. Will continue to follow to maximize strength and function.   Follow Up Recommendations  SNF;Supervision/Assistance - 24 hour     Equipment Recommendations       Recommendations for Other Services       Precautions / Restrictions Precautions Precautions: Fall Restrictions Weight Bearing Restrictions: No RLE Weight Bearing: Weight bearing as tolerated LLE Weight Bearing: Weight bearing as tolerated    Mobility  Bed Mobility               General bed mobility comments: pt in chair on arrival  Transfers Overall transfer level: Needs assistance   Transfers: Sit to/from Stand Sit to Stand: Max assist;+2 physical assistance         General transfer comment: attempted to stand from chair x 4 trials with use of belt and pad to assist sacral elevation. Max cues for hand placement and anterior translation. Pt able to rise from surface to clear sacrum but would not push through arms and extend hips to achieve fully upright position. Pt with decreased ability to follow commands and perform transfers today compared to last session.   Ambulation/Gait                 Stairs             Wheelchair Mobility    Modified Rankin (Stroke Patients Only)       Balance                                    Cognition Arousal/Alertness: Awake/alert Behavior During Therapy: Flat affect Overall Cognitive Status: Impaired/Different from baseline Area of Impairment: Following commands       Following Commands: Follows one step commands inconsistently     Problem Solving: Slow processing;Decreased initiation      Exercises General Exercises - Lower Extremity Long Arc Quad: AAROM;Both;10 reps;Seated Hip ABduction/ADduction: AAROM;Both;10 reps;Seated Hip Flexion/Marching: AAROM;Seated;Both;10 reps    General Comments        Pertinent Vitals/Pain Pain Assessment: 0-10 Pain Score: 3  Pain Location: bil knees Pain Descriptors / Indicators: Aching Pain Intervention(s): Limited activity within patient's tolerance;Monitored during session;Repositioned    Home Living                      Prior Function            PT Goals (current goals can now be found in the care plan section) Progress towards PT goals: Not progressing toward goals - comment    Frequency       PT Plan Current plan remains appropriate  Co-evaluation             End of Session Equipment Utilized During Treatment: Gait belt Activity Tolerance: Patient tolerated treatment well Patient left: in chair;with call bell/phone within reach;with nursing/sitter in room     Time: 0913-0936 PT Time Calculation (min) (ACUTE ONLY): 23 min  Charges:  $Therapeutic Exercise: 8-22 mins $Therapeutic Activity: 8-22 mins                    G Codes:      Melford Aase 01/05/2016, 10:21 AM Elwyn Reach, [place]

## 2016-01-03 LAB — ANAEROBIC CULTURE

## 2016-01-26 LAB — FUNGUS CULTURE W SMEAR
Fungal Smear: NONE SEEN
Fungal Smear: NONE SEEN

## 2016-02-10 LAB — AFB CULTURE WITH SMEAR (NOT AT ARMC)
ACID FAST SMEAR: NONE SEEN
Acid Fast Smear: NONE SEEN

## 2016-10-09 ENCOUNTER — Emergency Department (HOSPITAL_COMMUNITY): Payer: Self-pay

## 2016-10-09 ENCOUNTER — Encounter (HOSPITAL_COMMUNITY): Payer: Self-pay | Admitting: *Deleted

## 2016-10-09 ENCOUNTER — Inpatient Hospital Stay (HOSPITAL_COMMUNITY)
Admission: EM | Admit: 2016-10-09 | Discharge: 2016-10-13 | DRG: 728 | Disposition: A | Discharge status: Home Health | Payer: Self-pay | Attending: Internal Medicine | Admitting: Internal Medicine

## 2016-10-09 DIAGNOSIS — R52 Pain, unspecified: Secondary | ICD-10-CM

## 2016-10-09 DIAGNOSIS — E119 Type 2 diabetes mellitus without complications: Secondary | ICD-10-CM | POA: Diagnosis present

## 2016-10-09 DIAGNOSIS — Z794 Long term (current) use of insulin: Secondary | ICD-10-CM

## 2016-10-09 DIAGNOSIS — E785 Hyperlipidemia, unspecified: Secondary | ICD-10-CM | POA: Diagnosis present

## 2016-10-09 DIAGNOSIS — B353 Tinea pedis: Secondary | ICD-10-CM | POA: Diagnosis present

## 2016-10-09 DIAGNOSIS — Z7952 Long term (current) use of systemic steroids: Secondary | ICD-10-CM

## 2016-10-09 DIAGNOSIS — I10 Essential (primary) hypertension: Secondary | ICD-10-CM | POA: Diagnosis present

## 2016-10-09 DIAGNOSIS — Z23 Encounter for immunization: Secondary | ICD-10-CM

## 2016-10-09 DIAGNOSIS — D509 Iron deficiency anemia, unspecified: Secondary | ICD-10-CM | POA: Diagnosis present

## 2016-10-09 DIAGNOSIS — N492 Inflammatory disorders of scrotum: Principal | ICD-10-CM | POA: Diagnosis present

## 2016-10-09 DIAGNOSIS — I2510 Atherosclerotic heart disease of native coronary artery without angina pectoris: Secondary | ICD-10-CM | POA: Diagnosis present

## 2016-10-09 DIAGNOSIS — B351 Tinea unguium: Secondary | ICD-10-CM | POA: Diagnosis present

## 2016-10-09 DIAGNOSIS — Z79899 Other long term (current) drug therapy: Secondary | ICD-10-CM

## 2016-10-09 DIAGNOSIS — Z8249 Family history of ischemic heart disease and other diseases of the circulatory system: Secondary | ICD-10-CM

## 2016-10-09 DIAGNOSIS — B9620 Unspecified Escherichia coli [E. coli] as the cause of diseases classified elsewhere: Secondary | ICD-10-CM | POA: Diagnosis present

## 2016-10-09 DIAGNOSIS — Z833 Family history of diabetes mellitus: Secondary | ICD-10-CM

## 2016-10-09 DIAGNOSIS — M10029 Idiopathic gout, unspecified elbow: Secondary | ICD-10-CM | POA: Diagnosis present

## 2016-10-09 LAB — BASIC METABOLIC PANEL
Anion gap: 10 (ref 5–15)
BUN: 12 mg/dL (ref 6–20)
CHLORIDE: 103 mmol/L (ref 101–111)
CO2: 25 mmol/L (ref 22–32)
Calcium: 10 mg/dL (ref 8.9–10.3)
Creatinine, Ser: 0.9 mg/dL (ref 0.61–1.24)
Glucose, Bld: 153 mg/dL — ABNORMAL HIGH (ref 65–99)
POTASSIUM: 3.4 mmol/L — AB (ref 3.5–5.1)
SODIUM: 138 mmol/L (ref 135–145)

## 2016-10-09 LAB — CBC WITH DIFFERENTIAL/PLATELET
BASOS ABS: 0 10*3/uL (ref 0.0–0.1)
BASOS PCT: 0 %
Eosinophils Absolute: 0.1 10*3/uL (ref 0.0–0.7)
Eosinophils Relative: 1 %
HEMATOCRIT: 37.1 % — AB (ref 39.0–52.0)
HEMOGLOBIN: 12.1 g/dL — AB (ref 13.0–17.0)
LYMPHS PCT: 20 %
Lymphs Abs: 1.8 10*3/uL (ref 0.7–4.0)
MCH: 23 pg — ABNORMAL LOW (ref 26.0–34.0)
MCHC: 32.6 g/dL (ref 30.0–36.0)
MCV: 70.4 fL — AB (ref 78.0–100.0)
MONO ABS: 0.5 10*3/uL (ref 0.1–1.0)
MONOS PCT: 6 %
NEUTROS ABS: 6.7 10*3/uL (ref 1.7–7.7)
NEUTROS PCT: 73 %
Platelets: 201 10*3/uL (ref 150–400)
RBC: 5.27 MIL/uL (ref 4.22–5.81)
RDW: 16.9 % — AB (ref 11.5–15.5)
WBC: 9.1 10*3/uL (ref 4.0–10.5)

## 2016-10-09 LAB — GLUCOSE, CAPILLARY: Glucose-Capillary: 149 mg/dL — ABNORMAL HIGH (ref 65–99)

## 2016-10-09 MED ORDER — IBUPROFEN 400 MG PO TABS
400.0000 mg | ORAL_TABLET | ORAL | Status: DC | PRN
  Administered 2016-10-09 – 2016-10-12 (×6): 400 mg via ORAL
  Filled 2016-10-09 (×7): qty 1

## 2016-10-09 MED ORDER — VANCOMYCIN HCL IN DEXTROSE 1-5 GM/200ML-% IV SOLN
1000.0000 mg | Freq: Once | INTRAVENOUS | Status: AC
  Administered 2016-10-09: 1000 mg via INTRAVENOUS
  Filled 2016-10-09: qty 200

## 2016-10-09 MED ORDER — PNEUMOCOCCAL VAC POLYVALENT 25 MCG/0.5ML IJ INJ
0.5000 mL | INJECTION | INTRAMUSCULAR | Status: AC
  Administered 2016-10-10: 0.5 mL via INTRAMUSCULAR
  Filled 2016-10-09: qty 0.5

## 2016-10-09 MED ORDER — INSULIN ASPART 100 UNIT/ML ~~LOC~~ SOLN: 0.0000 [IU] | SUBCUTANEOUS | Status: DC

## 2016-10-09 MED ORDER — ACETAMINOPHEN 650 MG RE SUPP: 650.0000 mg | Freq: Four times a day (QID) | RECTAL | Status: DC | PRN

## 2016-10-09 MED ORDER — ACETAMINOPHEN 325 MG PO TABS
650.0000 mg | ORAL_TABLET | Freq: Four times a day (QID) | ORAL | Status: DC | PRN
  Administered 2016-10-09 – 2016-10-13 (×3): 650 mg via ORAL
  Filled 2016-10-09 (×3): qty 2

## 2016-10-09 MED ORDER — SIMVASTATIN 20 MG PO TABS: 20.0000 mg | ORAL_TABLET | Freq: Every evening | ORAL | Status: DC

## 2016-10-09 MED ORDER — HYDROMORPHONE HCL 1 MG/ML IJ SOLN: 1.0000 mg | Freq: Once | INTRAMUSCULAR | Status: DC

## 2016-10-09 MED ORDER — INSULIN GLARGINE 100 UNIT/ML ~~LOC~~ SOLN
35.0000 [IU] | Freq: Every day | SUBCUTANEOUS | Status: DC
  Administered 2016-10-09 – 2016-10-12 (×4): 35 [IU] via SUBCUTANEOUS
  Filled 2016-10-09 (×5): qty 0.35

## 2016-10-09 MED ORDER — OXYCODONE HCL 5 MG PO TABS
5.0000 mg | ORAL_TABLET | Freq: Four times a day (QID) | ORAL | Status: DC | PRN
  Administered 2016-10-09 – 2016-10-12 (×4): 5 mg via ORAL
  Filled 2016-10-09 (×4): qty 1

## 2016-10-09 MED ORDER — VANCOMYCIN HCL IN DEXTROSE 1-5 GM/200ML-% IV SOLN
1000.0000 mg | Freq: Three times a day (TID) | INTRAVENOUS | Status: DC
  Filled 2016-10-09: qty 200

## 2016-10-09 MED ORDER — VANCOMYCIN HCL IN DEXTROSE 1-5 GM/200ML-% IV SOLN
1000.0000 mg | Freq: Three times a day (TID) | INTRAVENOUS | Status: DC
  Filled 2016-10-09 (×2): qty 200

## 2016-10-09 MED ORDER — SODIUM CHLORIDE 0.9 % IV SOLN
INTRAVENOUS | Status: DC
  Administered 2016-10-09 (×2): via INTRAVENOUS
  Administered 2016-10-10: 1000 mL via INTRAVENOUS

## 2016-10-09 MED ORDER — ONDANSETRON HCL 4 MG/2ML IJ SOLN
4.0000 mg | Freq: Once | INTRAMUSCULAR | Status: DC
  Filled 2016-10-09: qty 2

## 2016-10-09 MED ORDER — ATENOLOL 50 MG PO TABS
50.0000 mg | ORAL_TABLET | Freq: Every day | ORAL | Status: DC
  Administered 2016-10-10 – 2016-10-13 (×4): 50 mg via ORAL
  Filled 2016-10-09 (×4): qty 1

## 2016-10-09 MED ORDER — ONDANSETRON HCL 4 MG PO TABS: 4.0000 mg | ORAL_TABLET | Freq: Four times a day (QID) | ORAL | Status: DC | PRN

## 2016-10-09 MED ORDER — HYDROMORPHONE HCL 2 MG/ML IJ SOLN
1.0000 mg | Freq: Once | INTRAMUSCULAR | Status: AC
  Administered 2016-10-09: 1 mg via INTRAVENOUS
  Filled 2016-10-09: qty 1

## 2016-10-09 MED ORDER — SENNA 8.6 MG PO TABS
1.0000 | ORAL_TABLET | Freq: Two times a day (BID) | ORAL | Status: DC
  Administered 2016-10-09 – 2016-10-13 (×8): 8.6 mg via ORAL
  Filled 2016-10-09 (×8): qty 1

## 2016-10-09 MED ORDER — LIDOCAINE-EPINEPHRINE (PF) 2 %-1:200000 IJ SOLN
20.0000 mL | Freq: Once | INTRAMUSCULAR | Status: AC
  Administered 2016-10-09: 20 mL via INTRADERMAL
  Filled 2016-10-09: qty 20

## 2016-10-09 MED ORDER — LIDOCAINE HCL (PF) 1 % IJ SOLN
5.0000 mL | Freq: Once | INTRAMUSCULAR | Status: DC
  Filled 2016-10-09: qty 5

## 2016-10-09 MED ORDER — ONDANSETRON HCL 4 MG/2ML IJ SOLN: 4.0000 mg | Freq: Four times a day (QID) | INTRAMUSCULAR | Status: DC | PRN

## 2016-10-09 MED ORDER — SODIUM CHLORIDE 0.9 % IV SOLN
2000.0000 mg | Freq: Once | INTRAVENOUS | Status: AC
  Administered 2016-10-09: 2000 mg via INTRAVENOUS
  Filled 2016-10-09: qty 2000

## 2016-10-09 MED ORDER — VANCOMYCIN HCL IN DEXTROSE 1-5 GM/200ML-% IV SOLN
1000.0000 mg | Freq: Two times a day (BID) | INTRAVENOUS | Status: DC
  Administered 2016-10-10 – 2016-10-12 (×5): 1000 mg via INTRAVENOUS
  Filled 2016-10-09 (×6): qty 200

## 2016-10-09 MED ORDER — ENOXAPARIN SODIUM 40 MG/0.4ML ~~LOC~~ SOLN
40.0000 mg | SUBCUTANEOUS | Status: DC
  Administered 2016-10-09 – 2016-10-12 (×4): 40 mg via SUBCUTANEOUS
  Filled 2016-10-09 (×4): qty 0.4

## 2016-10-09 MED ORDER — INSULIN ASPART 100 UNIT/ML ~~LOC~~ SOLN
0.0000 [IU] | Freq: Three times a day (TID) | SUBCUTANEOUS | Status: DC
  Administered 2016-10-10 – 2016-10-13 (×2): 2 [IU] via SUBCUTANEOUS

## 2016-10-09 NOTE — ED Notes (Signed)
Pt. Not complaining of nausea at this time, no zofran given

## 2016-10-09 NOTE — ED Triage Notes (Signed)
Pt reports using bodywash and now has swelling and drainage from groin area.

## 2016-10-09 NOTE — ED Notes (Signed)
Report given.

## 2016-10-09 NOTE — H&P (Signed)
Date: 10/09/2016               Patient Name:  [name] MRN: [id number]  DOB: 1961/07/24 Age / Sex: 55 y.o., male   PCP: Lenor Derrick, MD         Medical Service: Internal Medicine Teaching Service         Attending Physician: Dr. Sid Falcon, MD    First Contact: Dr. Jari Favre Pager: [phone]  Second Contact: Dr. Tiburcio Pea Pager: [phone]       After Hours (After 5p/  First Contact Pager: [phone]  weekends / holidays): Second Contact Pager: [phone]   Chief Complaint: Scrotal pain  History of Present Illness: [name] is a 55yo male with PMH of T2DM, HTN, and HLD presenting to the [facility] with scrotal pain. Patient endorses that he had a small pimple develop after starting use of body wash instead of regular soap with progressive irritation, tenderness, and increase in size since then. He states that the area started draining purulent and bloody fluid on its own one day prior to admission. He states he has used hydrogen peroxide and neosporin with no relief. Patient denies fevers, chills, dysuria, pyuria, hematuria, nausea, vomiting, abdominal pain, diarrhea, appetite change, headache, chest pain, shortness of breath or cough. He denies previous similar episodes.   Patient also endorses chronic lesions on bilateral lower extremities that he states have been there since he was a teenager. These lesions are nonpruritic, nontender, nonbleeding, and do not appear to be spreading as far as the patient can tell. He denies previous work up for these lesions.   In the ED, patient was evaluated by Urology who performed bedside I&D and began IV Vanc.   Labs:  WBC 9.1, Hgb 12.1, Hct 37.1, MCV 70.4, Plts 201 Na 138, K 3.4, Cl 103, CO2 25, glu 153, BUN 12, Cr 0.9, Ca 10  Meds:  Current Meds  Medication Sig  . atenolol (TENORMIN) 50 MG tablet Take 1 tablet (50 mg total) by mouth daily.  . bisacodyl (DULCOLAX) 5 MG EC tablet Take 1 tablet (5 mg total) by mouth daily as needed for  moderate constipation.  . insulin aspart protamine- aspart (NOVOLOG MIX 70/30) (70-30) 100 UNIT/ML injection Inject 0.4 mLs (40 Units total) into the skin 2 (two) times daily with a meal.  . metFORMIN (GLUCOPHAGE) 500 MG tablet Take 1 tablet (500 mg total) by mouth 2 (two) times daily with a meal.  . predniSONE (DELTASONE) 20 MG tablet Take 3 tablets (60 mg total) by mouth daily with breakfast. For 1 week, then decrease by 10mg  every 3days  . senna-docusate (SENOKOT-S) 8.6-50 MG tablet Take 1 tablet by mouth 2 (two) times daily.  . simvastatin (ZOCOR) 20 MG tablet Take 1 tablet (20 mg total) by mouth every evening.   Allergies: Allergies as of 10/09/2016  . (No Known Allergies)   Past Medical History:  Diagnosis Date  . Gouty bursitis, elbow  10/18/2015  . Hx of gout   . Hyperlipidemia   . Hypertension   . Knee pain, chronic   . Type II diabetes mellitus ([place])     Family History: Mother with DM and HTN  Social History: Denies past or current tobacco, alcohol or drug use. Lives in [place].   Review of Systems: A complete ROS was negative except as per HPI.   Physical Exam: Blood pressure (!) 176/95, pulse 91, temperature 98.6 F (37 C), temperature source Oral, resp. rate 18,  SpO2 100 %. Constitutional: NAD, pleasant Eyes: no scleral icterus, EOMI ENT: oropharynx clear of erythema or exudates CV: RRR, no murmurs, rubs or gallops appreciated. Pulses intact throughout, minimal LE edema. Resp: CTAB, no increased work of breathing, no wheezing or crackles appreciated Abd: soft, +BS, NDNT. Heme/Lymph: left sided inguinal lymphadenopathy Neuro: CN 2-12 grossly intact Psych: affect and behavior appropriate, alert and oriented x 3 Skin: diffuse crusted lesions on bilateral LE that are nontender, non-indurated and non-erythematous; appear that they can be easily displaced from skin. Extensive dry skin over bilateral feet with fungal growth on bil plantar aspects of feet, and  thickened, cracked nails. Right sided scrotal wound packed and bandaged with mild sanguinous drainage on dressing.  US scrotum -R with doppler 10/09/2016: No defined right testicle was identified. Severe heterogeneous soft tissue mass with adjacent scrotal edema noted on the right. These changes could be secondary to a right testicular/ scrotal infection and/or trauma. An underlying torsion cannot be excluded. Tumor cannot be excluded --Pulsed Doppler interrogation of both testes demonstrates normal low resistance arterial and venous waveforms on the left.  Assessment & Plan by Problem: Active Problems:   Scrotal abscess  Scrotal Abscess: Patient presented with a couple week history of increasing swelling, tenderness and discomfort in his scrotum, [place] consistent with scrotal abscess. He is afebrile, without leukocytosis. Urology evaluated patient in ED and proceeded with bedside I&D which produced about 2ml of seropurulent fluid which was extracted. Gram stain of fluid did not reveal WBC or organisms --urology following - appreciate their assistance --IV vanc --f/u wound culture and narrow spectrum as appropriate --f/u AM CBC --tylenol, ibuprofen, oxy IR PRN for pain control  T2DM: Patient with history of T2DM managed at home with Novolog mix 35U BID and metformin 500mg  BID. On admission CBG 153.  --Lantus 35U qhs --SSI-S TIC wc  Hypertension: Patient with HTN managed at home with atenolol 50mg  daily. Hypertensive at admission. --continue atenolol 50mg  daily --consider adding Ace-I in diabetic with hypertension for renal protection.   HDL: Patient continued on simvastatin 20mg  daily.   LE lesions: Unsure of etiology of these chronic lesions. Would consider outpatient biopsy.  Onychomycosis and tenia pedis: Consider starting terbinafine 250mg  daily x 12 weeks. Consider outpatient podiatry referral.   Dispo: Admit patient to Inpatient with expected length of stay greater than  2 midnights.  Signed: Alphonzo Grieve, MD 10/09/2016, 7:37 PM  Pager [phone]

## 2016-10-09 NOTE — Consult Note (Signed)
Urology Consult  Consulting MD: Lacretia Leigh, M.D.  CC: Right testicular swelling/pain  HPI: This is a 55 year old male with diabetes, presenting the emergency room with a several day history of right testicular pain, swelling, and yesterday, purulent drainage.  Evaluation revealed significant induration of the skin and testicle, as well as a draining sinus.  Urologic consultation is requested due to the significant swelling in this area, as well as the patient's medical issues/diabetes.  PMH: Past Medical History:  Diagnosis Date  . Gouty bursitis, elbow  10/18/2015  . Hx of gout   . Hyperlipidemia   . Hypertension   . Knee pain, chronic   . Type II diabetes mellitus (HCC)     PSH: Past Surgical History:  Procedure Laterality Date  . KNEE ARTHROSCOPY Bilateral 12/29/2015   Procedure: ARTHROSCOPY KNEE BILATERAL WITH  PARTIAL MEDIAL AND LATERAL MENISCECTOMY AND FOUR COMPARTMENT SYNOVECTOMY , CHONDRALPLASTIES, PATELLA-FEMORAL CHONDRALPLASTIES BILATERALLY;  Surgeon: Dorna Leitz, MD;  Location: [place];  Service: Orthopedics;  Laterality: Bilateral;  . NO PAST SURGERIES      Allergies: No Known Allergies  Medications:  (Not in a hospital admission)   Social History: Social History   Social History  . Marital status: Single    Spouse name: N/A  . Number of children: N/A  . Years of education: N/A   Occupational History  . Not on file.   Social History Main Topics  . Smoking status: Never Smoker  . Smokeless tobacco: Never Used  . Alcohol use No  . Drug use: No  . Sexual activity: Not Currently   Other Topics Concern  . Not on file   Social History Narrative  . No narrative on file    Family History: Family History  Problem Relation Age of Onset  . Diabetes Mother   . Diabetes Father     Review of Systems: Positive: Right testicular pain, swelling, right scrotal drainage Negative:   A further 10 point review of systems was negative except what is listed  in the HPI.  Physical Exam: @VITALS2 @ General: No acute distress.  Awake. Head:  Normocephalic.  Atraumatic. ENT:  EOMI.  Mucous membranes moist Neck:  Supple.  No lymphadenopathy. CV:  S1 present. S2 present. Regular rate. Pulmonary: Equal effort bilaterally.  Clear to auscultation bilaterally. Abdomen: Obese, nontender Skin:  Normal turgor.  No visible rash. Extremity: No gross deformity of bilateral upper extremities.  No gross deformity of    bilateral lower extremities. Neurologic: Alert. Appropriate mood.  Penis:  Uncircumcised.  No lesions. Urethra:  Orthotopic meatus. Scrotum: Anteriorly, scrotal skin is tender and indurated.  Draining sinus in the right anterior superior scrotum.  Just below this, there is still a fluctuant area.  There is significant tenderness of the entire right hemiscrotum and the underlying, indurated testicle. Testicles: Descended bilaterally.  Significant right testicular tenderness/induration Epididymis: Palpable bilaterally.    Studies:  Recent Labs     10/09/16  1110  HGB  12.1*  WBC  9.1  PLT  201    Recent Labs     10/09/16  1110  NA  138  K  3.4*  CL  103  CO2  25  BUN  12  CREATININE  0.90  CALCIUM  10.0  GFRNONAA  >60  GFRAA  >60     No results for input(s): INR, APTT in the last 72 hours.  Invalid input(s): PT   Invalid input(s): ABG  After sterile prep and drape, and following  patient's verbal consent, his right anterior superior hemi-scrotum was infiltrated with 2% lidocaine with epinephrine.  A 2 centimeter incision was then made deep into the subcutaneous tissue, into the abscess cavity.  Hemostat was used to break up loculations and approximate 30 mL of seropurulent material was liberated.  This was cultured.  The cavity was then packed with one quarter inch iodoform gauze.  Gauze dressing was placed over top of this.  Patient tolerated procedure well.  Assessment:  Scrotal abscess with testicular induration in a  diabetic 55 year old male  Plan: I would recommend admission to the hospital for medical management with broad-spectrum antibiotics over the next day or 2, as the patient is a diabetic, and I think that this probably has been a chronic process.  We will follow over the next 2-3 days.  He will need follow-up next week in our office.  For now, packing can be left in until his follow-up in the office.    [phone]

## 2016-10-09 NOTE — Progress Notes (Addendum)
Pharmacy Antibiotic Note  [name] is a 55 y.o. male admitted on 10/09/2016 with purulent abscess of right testicle. He is now s/p source control with I&D and pharmacy has been consulted for vancomycin dosing. No weight entered yet, but previous weight from February is in line with nurses estimate of ~100 kg. CrCl between 92-125 mL/min. Currently afebrile, wbc wnl. Surgical cultures sent.   Of note, pt received 1gm IV Vancomycin ~ 1100 today.  Since this was 8 hours ago, will continue with full loading dose.   Plan: Vancomycin 2000mg  x1 Vancomycin 1000 mg IV every 12 hours.  Goal trough 10-15 mcg/mL. Monitor trough at [facility] Monitor c/s - ability to de-escalate Monitor renal function    Temp (24hrs), Avg:98.1 F (36.7 C), Min:97.6 F (36.4 C), Max:98.6 F (37 C)   Recent Labs Lab 10/09/16 1110  WBC 9.1  CREATININE 0.90    CrCl cannot be calculated (Unknown ideal weight.).    No Known Allergies  Antimicrobials this admission: Vancomycin 11/22 >>   Dose adjustments this admission: Dosing for obesity  Microbiology results: 11/22 Surgical Cx: Sent   Thank you for allowing pharmacy to be a part of this patient's care.  Dierdre Harness, [place] Sieve, PharmD Clinical Pharmacy Resident [phone] (Pager) 10/09/2016 6:55 PM   Horton Chin, Pharm.D., BCPS Clinical Pharmacist Pager [phone] 10/09/2016 7:15 PM

## 2016-10-09 NOTE — ED Provider Notes (Signed)
[facility] DEPT Provider Note   CSN: [id number] Arrival date & time: 10/09/16  1033     History   Chief Complaint Chief Complaint  Patient presents with  . Allergic Reaction    HPI [name] is a 55 y.o. male.  55 year old male presents with several-day history of swelling and purulent drainage from his right scrotum. Denies any fever or chills. No perineal discomfort. Patient has had some right groin pain with this. No abdominal discomfort. Pain is sharp and worse with movement to his scrotum and better with remaining still. Has been using topical medications without relief.      Past Medical History:  Diagnosis Date  . Gouty bursitis, elbow  10/18/2015  . Hx of gout   . Hyperlipidemia   . Hypertension   . Knee pain, chronic   . Type II diabetes mellitus [facility])     Patient Active Problem List   Diagnosis Date Noted  . Acute meniscal tear of right knee 12/29/2015  . Acute meniscal tear of left knee 12/29/2015  . Other specified crystal arthropathies, left knee 12/29/2015  . Other specified crystal arthropathies, right knee 12/29/2015  . Arthritis of knee   . Inability to ambulate due to knee   . Microcytic anemia   . Bilateral knee pain 12/23/2015  . Hypertension 12/23/2015  . Chronic mid back pain 12/23/2015  . Gout 12/23/2015  . Osteoarthritis of both knees 12/23/2015  . Intractable pain 12/23/2015  . Normocytic anemia 12/23/2015  . Gouty bursitis, elbow  10/18/2015  . IDDM (insulin dependent diabetes mellitus) ([place]) 10/18/2015    Past Surgical History:  Procedure Laterality Date  . KNEE ARTHROSCOPY Bilateral 12/29/2015   Procedure: ARTHROSCOPY KNEE BILATERAL WITH  PARTIAL MEDIAL AND LATERAL MENISCECTOMY AND FOUR COMPARTMENT SYNOVECTOMY , CHONDRALPLASTIES, PATELLA-FEMORAL CHONDRALPLASTIES BILATERALLY;  Surgeon: Dorna Leitz, MD;  Location: [place];  Service: Orthopedics;  Laterality: Bilateral;  . NO PAST SURGERIES         Home Medications     Prior to Admission medications   Medication Sig Start Date End Date Taking? Authorizing Provider  atenolol (TENORMIN) 50 MG tablet Take 1 tablet (50 mg total) by mouth daily. 10/13/13   Reyne Dumas, MD  bisacodyl (DULCOLAX) 5 MG EC tablet Take 1 tablet (5 mg total) by mouth daily as needed for moderate constipation. 12/31/15   Domenic Polite, MD  colchicine 0.6 MG tablet Take 1 tablet (0.6 mg total) by mouth daily. 10/19/15   Shanker Kristeen Mans, MD  HYDROmorphone (DILAUDID) 2 MG tablet Take 1-2 tablets (2-4 mg total) by mouth every 4 (four) hours as needed for severe pain. 12/31/15   Domenic Polite, MD  insulin aspart protamine- aspart (NOVOLOG MIX 70/30) (70-30) 100 UNIT/ML injection Inject 0.4 mLs (40 Units total) into the skin 2 (two) times daily with a meal. 10/13/13   Reyne Dumas, MD  metFORMIN (GLUCOPHAGE) 500 MG tablet Take 1 tablet (500 mg total) by mouth 2 (two) times daily with a meal. 10/13/13   Reyne Dumas, MD  predniSONE (DELTASONE) 20 MG tablet Take 3 tablets (60 mg total) by mouth daily with breakfast. For 1 week, then decrease by 10mg  every 3days 01/01/16   Domenic Polite, MD  senna-docusate (SENOKOT-S) 8.6-50 MG tablet Take 1 tablet by mouth 2 (two) times daily. 12/31/15   Domenic Polite, MD  simvastatin (ZOCOR) 20 MG tablet Take 1 tablet (20 mg total) by mouth every evening. Patient not taking: Reported on 12/04/2015 10/13/13   Reyne Dumas, MD  Family History Family History  Problem Relation Age of Onset  . Diabetes Mother   . Diabetes Father     Social History Social History  Substance Use Topics  . Smoking status: Never Smoker  . Smokeless tobacco: Never Used  . Alcohol use No     Allergies   Patient has no known allergies.   Review of Systems Review of Systems  All other systems reviewed and are negative.    Physical Exam Updated Vital Signs BP (!) 191/136 (BP Location: Right Arm)   Pulse 62   Temp 97.6 F (36.4 C) (Oral)   Resp (!) 64   SpO2 99%    Physical Exam  Constitutional: He is oriented to person, place, and time. He appears well-developed and well-nourished.  Non-toxic appearance. No distress.  HENT:  Head: Normocephalic and atraumatic.  Eyes: Conjunctivae, EOM and lids are normal. Pupils are equal, round, and reactive to light.  Neck: Normal range of motion. Neck supple. No tracheal deviation present. No thyroid mass present.  Cardiovascular: Normal rate, regular rhythm and normal heart sounds.  Exam reveals no gallop.   No murmur heard. Pulmonary/Chest: Effort normal and breath sounds normal. No stridor. No respiratory distress. He has no decreased breath sounds. He has no wheezes. He has no rhonchi. He has no rales.  Abdominal: Soft. Normal appearance and bowel sounds are normal. He exhibits no distension. There is no tenderness. There is no rebound and no CVA tenderness.  Genitourinary:     Musculoskeletal: Normal range of motion. He exhibits no edema or tenderness.  Neurological: He is alert and oriented to person, place, and time. He has normal strength. No cranial nerve deficit or sensory deficit. GCS eye subscore is 4. GCS verbal subscore is 5. GCS motor subscore is 6.  Skin: Skin is warm and dry. No abrasion and no rash noted.  Psychiatric: He has a normal mood and affect. His speech is normal and behavior is normal.  Nursing note and vitals reviewed.    ED Treatments / Results  Labs (all labs ordered are listed, but only abnormal results are displayed) Labs Reviewed  CBC WITH DIFFERENTIAL/PLATELET  BASIC METABOLIC PANEL    EKG  EKG Interpretation None       Radiology No results found.  Procedures Procedures (including critical care time)  Medications Ordered in ED Medications  0.9 %  sodium chloride infusion (not administered)  HYDROmorphone (DILAUDID) injection 1 mg (not administered)  ondansetron (ZOFRAN) injection 4 mg (not administered)     Initial Impression / Assessment and Plan /  ED Course  I have reviewed the triage vital signs and the nursing notes.  Pertinent labs & imaging results that were available during my care of the patient were reviewed by me and considered in my medical decision making (see chart for details).  Clinical Course   Patient started on IV vancomycin.  Patient seen by urologist and abscess drain. Recommends that patient be admitted to the medicine service for IV antibiotics  Final Clinical Impressions(s) / ED Diagnoses   Final diagnoses:  None    New Prescriptions New Prescriptions   No medications on file     Lacretia Leigh, MD 10/09/16 1501

## 2016-10-09 NOTE — ED Notes (Signed)
Attempted report 

## 2016-10-10 DIAGNOSIS — R52 Pain, unspecified: Secondary | ICD-10-CM

## 2016-10-10 LAB — BASIC METABOLIC PANEL
ANION GAP: 9 (ref 5–15)
BUN: 12 mg/dL (ref 6–20)
CALCIUM: 9.3 mg/dL (ref 8.9–10.3)
CO2: 22 mmol/L (ref 22–32)
Chloride: 107 mmol/L (ref 101–111)
Creatinine, Ser: 0.79 mg/dL (ref 0.61–1.24)
GFR calc Af Amer: >60 mL/min (ref >60)
GFR calc non Af Amer: >60 mL/min (ref >60)
GLUCOSE: 109 mg/dL — AB (ref 65–99)
POTASSIUM: 3.8 mmol/L (ref 3.5–5.1)
Sodium: 138 mmol/L (ref 135–145)

## 2016-10-10 LAB — CBC
HEMATOCRIT: 35.7 % — AB (ref 39.0–52.0)
Hemoglobin: 11.3 g/dL — ABNORMAL LOW (ref 13.0–17.0)
MCH: 22.3 pg — AB (ref 26.0–34.0)
MCHC: 31.7 g/dL (ref 30.0–36.0)
MCV: 70.6 fL — AB (ref 78.0–100.0)
Platelets: 182 10*3/uL (ref 150–400)
RBC: 5.06 MIL/uL (ref 4.22–5.81)
RDW: 16.7 % — AB (ref 11.5–15.5)
WBC: 5.9 10*3/uL (ref 4.0–10.5)

## 2016-10-10 LAB — LIPID PANEL
CHOL/HDL RATIO: 4.5 ratio
CHOLESTEROL: 158 mg/dL (ref 0–200)
HDL: 35 mg/dL — ABNORMAL LOW (ref >40)
LDL CALC: 100 mg/dL — AB (ref 0–99)
TRIGLYCERIDES: 117 mg/dL (ref <150)
VLDL: 23 mg/dL (ref 0–40)

## 2016-10-10 LAB — GLUCOSE, CAPILLARY
GLUCOSE-CAPILLARY: 132 mg/dL — AB (ref 65–99)
GLUCOSE-CAPILLARY: 81 mg/dL (ref 65–99)
Glucose-Capillary: 113 mg/dL — ABNORMAL HIGH (ref 65–99)
Glucose-Capillary: 115 mg/dL — ABNORMAL HIGH (ref 65–99)

## 2016-10-10 LAB — FERRITIN: Ferritin: 200 ng/mL (ref 24–336)

## 2016-10-10 MED ORDER — FERROUS SULFATE 325 (65 FE) MG PO TABS
325.0000 mg | ORAL_TABLET | Freq: Every day | ORAL | Status: DC
  Administered 2016-10-11 – 2016-10-13 (×3): 325 mg via ORAL
  Filled 2016-10-10 (×3): qty 1

## 2016-10-10 MED ORDER — ASPIRIN EC 81 MG PO TBEC
81.0000 mg | DELAYED_RELEASE_TABLET | Freq: Every day | ORAL | Status: DC
  Administered 2016-10-10 – 2016-10-13 (×4): 81 mg via ORAL
  Filled 2016-10-10 (×4): qty 1

## 2016-10-10 MED ORDER — LISINOPRIL 5 MG PO TABS
5.0000 mg | ORAL_TABLET | Freq: Every day | ORAL | Status: DC
  Administered 2016-10-10 – 2016-10-13 (×4): 5 mg via ORAL
  Filled 2016-10-10 (×4): qty 1

## 2016-10-10 MED ORDER — ATORVASTATIN CALCIUM 40 MG PO TABS
40.0000 mg | ORAL_TABLET | Freq: Every day | ORAL | Status: DC
  Administered 2016-10-10 – 2016-10-12 (×3): 40 mg via ORAL
  Filled 2016-10-10 (×4): qty 1

## 2016-10-10 NOTE — Progress Notes (Signed)
Right hemiscrotum still swollen and tender Dressing damp Labs OK Continue iv antibiotics Will need to stay at least to saturday

## 2016-10-10 NOTE — Progress Notes (Signed)
   Subjective:   No acute events overnight. Pt feeling well and watching TV. Pain well controlled. He denies abdominal pain, nausea, vomiting, shortness of breath or chest pain.   He states that he would be interested in following up with [facility] clinic as his PCP after discharge.  Objective:  Vital signs in last 24 hours: Vitals:   10/09/16 1954 10/09/16 2044 10/10/16 0445 10/10/16 0950  BP:  103/70 133/86 139/64  Pulse:  83 67 82  Resp:  18 17 18   Temp:  98.2 F (36.8 C) 97.8 F (36.6 C) 97.8 F (36.6 C)  TempSrc:  Oral Oral Oral  SpO2:  98% 100% 100%  Weight: 96.6 kg (213 lb)     Height: 5\' 5"  (1.651 m)      General: Vital signs reviewed. Patient in no acute distress Cardiovascular: regular rate, rhythm, no murmur appreciated  Pulmonary/Chest: Clear to auscultation bilaterally, no wheezes, rales, or rhonchi. Abdominal: Soft, non-tender, non-distended, BS + GU: right scrotal wound packed and bandaged. Somewhat tender to palpation in right inguinal area.  Extremities: No lower extremity edema bilaterally, Skin: chronic eczematous changes on both legs bilaterally with some crusting and peeling. Has onychomycosis.  Assessment/Plan:  Active Problems:   Scrotal abscess  Scrotal abscess:  S/p day 1 I&D per urology, on IV vanc. Patient still has tenderness but overall pain is well controlled and improving. Gram stain without evidence of WBC or organisms and culture is pending. -appreciate urology recs - tentative d/c date from urology on 11/25 -continue IV vanc -follow cultures -tylenol, ibuprofen and oxy IR PRN for pain control   DM:  CBGs have been well controlled -lantus 35 units daily -SSI TID WC -added lisinopril -added atorvastatin and stopped simvastatin -added aspirin  HTN:  Home regimen is atenolol. -continue atenolol -added lisinopril mainly for DM  HLD: -changed from simvastatin to atorvastatin due to history of diabetes -check lipid panel  Microcytic  iron deficiency anemia:  MCV of 70 with elevated RDW suggestive of iron-deficiency anemia. He has had iron studies in the past (most recently in Feb 2017 before knee procedure). -start oral iron therapy daily  -consider ferritin check at outpatient appointment once infection has resolved as it may be falsely elevated.   Severe onychomycosis -outpatient follow up with podiatry  DVT ppx: lovenox Code full  Dispo: Anticipated discharge in approximately 1-2 day(s).   Alphonzo Grieve, MD 10/10/2016, 12:49 PM

## 2016-10-11 LAB — BASIC METABOLIC PANEL
Anion gap: 11 (ref 5–15)
BUN: 11 mg/dL (ref 6–20)
CHLORIDE: 103 mmol/L (ref 101–111)
CO2: 23 mmol/L (ref 22–32)
CREATININE: 0.84 mg/dL (ref 0.61–1.24)
Calcium: 9.6 mg/dL (ref 8.9–10.3)
GFR calc Af Amer: >60 mL/min (ref >60)
GFR calc non Af Amer: >60 mL/min (ref >60)
GLUCOSE: 101 mg/dL — AB (ref 65–99)
Potassium: 3.6 mmol/L (ref 3.5–5.1)
SODIUM: 137 mmol/L (ref 135–145)

## 2016-10-11 LAB — CBC
HCT: 35.9 % — ABNORMAL LOW (ref 39.0–52.0)
HEMOGLOBIN: 11.2 g/dL — AB (ref 13.0–17.0)
MCH: 22 pg — AB (ref 26.0–34.0)
MCHC: 31.2 g/dL (ref 30.0–36.0)
MCV: 70.7 fL — ABNORMAL LOW (ref 78.0–100.0)
Platelets: 187 10*3/uL (ref 150–400)
RBC: 5.08 MIL/uL (ref 4.22–5.81)
RDW: 16.6 % — ABNORMAL HIGH (ref 11.5–15.5)
WBC: 6.3 10*3/uL (ref 4.0–10.5)

## 2016-10-11 LAB — HEMOGLOBIN A1C
HEMOGLOBIN A1C: 7.7 % — AB (ref 4.8–5.6)
MEAN PLASMA GLUCOSE: 174 mg/dL

## 2016-10-11 LAB — GLUCOSE, CAPILLARY
GLUCOSE-CAPILLARY: 120 mg/dL — AB (ref 65–99)
Glucose-Capillary: 119 mg/dL — ABNORMAL HIGH (ref 65–99)
Glucose-Capillary: 145 mg/dL — ABNORMAL HIGH (ref 65–99)
Glucose-Capillary: 140 mg/dL — ABNORMAL HIGH (ref 65–99)

## 2016-10-11 MED ORDER — DEXTROSE 5 % IV SOLN
1.0000 g | INTRAVENOUS | Status: DC
  Administered 2016-10-11: 1 g via INTRAVENOUS
  Filled 2016-10-11 (×2): qty 10

## 2016-10-11 NOTE — Care Management Note (Signed)
Case Management Note  Patient Details  Name: [name] MRN: [id number] Date of Birth: Jan 05, 1961  Subjective/Objective:      CM following for progression and d/c planning.               Action/Plan: 10/11/2016 Met with pt re [place] needs, and have asked [facility] to meet with pt and attempt to qualify for [facility], as pt is uninsured. HH explained to pt as, HH is unable to provide daily dressing changes in the home and is dependent upon pt, family or friends to learn to change the dressing and assist the pt. This was explained in detail to the pt as the concept of [place] services regardless to pt insurance status. Pt voices understanding and states that his girlfriend works all the time, however, would be able to learn to change his dressing . This was discussed with pt attending.   Expected Discharge Date:                  Expected Discharge Plan:  [place]  In-House Referral:  Clinical Social Work  Discharge planning Services  CM Consult  Post Acute Care Choice:  Home Health Choice offered to:  Patient  DME Arranged:  N/A DME Agency:  NA  HH Arranged:  RN [place] Agency:  [facility]  Status of Service:  In process, will continue to follow  If discussed at Long Length of Stay Meetings, dates discussed:    Additional Comments:  Adron Bene, RN 10/11/2016, 11:32 AM

## 2016-10-11 NOTE — Discharge Summary (Signed)
Name: [name] MRN: [id number] DOB: 1960-12-08 55 y.o. PCP: Lenor Derrick, MD  Date of Admission: 10/09/2016 10:58 AM Date of Discharge: 10/13/2016 Attending Physician: Gilles Chiquito  Discharge Diagnosis: Scrotal Abscess Active Problems:   Scrotal abscess   Discharge Medications:   Medication List    STOP taking these medications   colchicine 0.6 MG tablet   HYDROmorphone 2 MG tablet Commonly known as:  DILAUDID   predniSONE 20 MG tablet Commonly known as:  DELTASONE   senna-docusate 8.6-50 MG tablet Commonly known as:  Senokot-S   simvastatin 20 MG tablet Commonly known as:  ZOCOR     TAKE these medications   aspirin 81 MG EC tablet Take 1 tablet (81 mg total) by mouth daily.   atenolol 50 MG tablet Commonly known as:  TENORMIN Take 1 tablet (50 mg total) by mouth daily.   atorvastatin 40 MG tablet Commonly known as:  LIPITOR Take 1 tablet (40 mg total) by mouth daily at 6 PM.   bisacodyl 5 MG EC tablet Commonly known as:  DULCOLAX Take 1 tablet (5 mg total) by mouth daily as needed for moderate constipation.   ciprofloxacin 500 MG tablet Commonly known as:  CIPRO Take 1 tablet (500 mg total) by mouth 2 (two) times daily. For 2 weeks starting 10/12/16   ferrous sulfate 325 (65 FE) MG tablet Take 1 tablet (325 mg total) by mouth daily with breakfast.   HYDROcodone-acetaminophen 5-325 MG tablet Commonly known as:  NORCO Take 1 tablet by mouth every 8 (eight) hours as needed for moderate pain.   insulin aspart protamine- aspart (70-30) 100 UNIT/ML injection Commonly known as:  NOVOLOG MIX 70/30 Inject 0.25 mLs (25 Units total) into the skin 2 (two) times daily with a meal. What changed:  how much to take   lisinopril 5 MG tablet Commonly known as:  PRINIVIL,ZESTRIL Take 1 tablet (5 mg total) by mouth daily.   metFORMIN 500 MG tablet Commonly known as:  GLUCOPHAGE Take 1 tablet (500 mg total) by mouth 2 (two) times daily with a meal.       Disposition and follow-up:   [name] was discharged from [facility] in Good condition.  At the hospital follow up visit please address:  1.   -Has he followed up with urology? Was informed to follow up within week of discharge. -Has home health been by to check on his wound and repack? -How is his pain controlled? -Any fever or chills? Any increased swelling, drainage or pain? -Please re-evaluate patient's CBG's and adjust insulin regimen (was on Novolog 70/30 mix 35U BID at home but discharged at 25U BID based on insulin requirement during hospitalization. A1c was 7.7. -Please refer patient to outpatient podiatry for severe onychomycosis as able in setting of no insurance coverage.  2.  Labs / imaging needed at time of follow-up: CBC for WBC and anemia; BMP for kidney function change with addition of lisinopril; consider ferritin check once infection has cleared  3.  Pending labs/ test needing follow-up: none  Follow-up Appointments: Follow-up Information    [facility] Follow up.   Why:  Home Health RN for dressing changes Contact information: [place] [address] [place]. Call.   Why:  Call clinic Case Manager to schedule follow up appointment on Monday 11/27 [phone]  Contact information: [place]  [place] [address] [phone]       Jorja Loa, MD Follow up.   Specialty:  Urology Why:  Call for appointment to be seen later this week to check your wound Contact information: [place] [address] [phone]          Hospital Course by problem list: Active Problems:   Scrotal abscess   Right sided scrotal abscess: Patient presented with two weeks of progressive swelling, tenderness and recent draining of scrotal lesion. Urology evaluated patient at bedside in the ED and  performed I&D of the abscess after scrotal ultrasound revealed abscess and no interruption of blood flow - yielded 46ml sero-purulent fluid. The wound was packed and patient placed on IV antibiotics pending culture and sensitivities. On admission, patient was afebrile, without leukocytosis, with stable vital signs. During hospitalization, swelling and tenderness was progressively decreasing and patient was able to ambulate without problem. Pain was well controlled with minimal opioids. Cultures of the abscess yielded E coli that was sensitive to ciprofloxacin, ceftriaxone,and bactrim. At discharge, patient was placed on 2 week course of ciprofloxacin 500mg  BID. He was to call urology to set up appointment for wound check. He was set up with [facility] wound care since he does not have adequate support and is unable to do it for himself.   T2DM: Patient with history of T2DM managed at home with Novolog mix 35U BID and metformin 500mg  BID. While inpatient we managed with with lantus and SS-insulin. A1c was 7.7. Patient was discharged with decreased home Novolog mix at 25U BID based on his insulin requirement during hospitalization. Please address this at follow up visits for further adjustment of insulin regimen. He was continued on his metformin 500mg  BID.   HTN: Patient with history of hypertension controlled with home regimen of atenolol 50mg  daily. In setting of T2DM, we began lisinopril 5mg  daily as well. His BP continued to be well controlled and he did not have any hypotension. Please check BMP for kidney function change with addition of lisinopril.  HLD: Patient was originally on simvastatin 20mg  daily. Lipid panel was checked and showed LDL 100. ASCVD risk was 33% so he was transitioned to atorvastatin 40mg  daily.   Microcytic iron-deficiency anemia: Patient with MCV 70 and elevated RDW suggestive of iron-deficiency anemia. He denies known bleeding. He was started on oral iron therapy. Please consider  ferritin check or formal iron panel at outpatient visit once infection has cleared so there would not be false elevation of ferritin.   Severe onychomycosis, tinea pedis: Patient with bilateral severe onychomycosis. Please consider outpatient referral to podiatry.   Discharge Vitals:   BP 136/89 (BP Location: Right Arm)   Pulse (!) 57   Temp 98.2 F (36.8 C) (Oral)   Resp 18   Ht 5\' 5"  (1.651 m)   Wt 214 lb (97.1 kg)   SpO2 100%   BMI 35.61 kg/m   Pertinent Labs, Studies, and Procedures:  CBC Latest Ref Rng & Units 10/12/2016 10/11/2016 10/10/2016  WBC 4.0 - 10.5 K/uL 6.8 6.3 5.9  Hemoglobin 13.0 - 17.0 g/dL 11.1(L) 11.2(L) 11.3(L)  Hematocrit 39.0 - 52.0 % 34.7(L) 35.9(L) 35.7(L)  Platelets 150 - 400 K/uL 178 187 182   BMP Latest Ref Rng & Units 10/12/2016 10/11/2016 10/10/2016  Glucose 65 - 99 mg/dL 92 101(H) 109(H)  BUN 6 - 20 mg/dL 15 11 12   Creatinine 0.61 - 1.24 mg/dL 0.99 0.84 0.79  Sodium 135 - 145 mmol/L 138 137  138  Potassium 3.5 - 5.1 mmol/L 3.9 3.6 3.8  Chloride 101 - 111 mmol/L 103 103 107  CO2 22 - 32 mmol/L 26 23 22   Calcium 8.9 - 10.3 mg/dL 9.4 9.6 9.3    Ref. Range 10/10/2016 11:25  Cholesterol Latest Ref Range: 0 - 200 mg/dL 158  Triglycerides Latest Ref Range: <150 mg/dL 117  HDL Cholesterol Latest Ref Range: >40 mg/dL 35 (L)  LDL (calc) Latest Ref Range: 0 - 99 mg/dL 100 (H)  VLDL Latest Ref Range: 0 - 40 mg/dL 23  Total CHOL/HDL Ratio Latest Units: RATIO 4.5   Escherichia coli      MIC    AMPICILLIN >=32 RESIST... Resistant    AMPICILLIN/SULBACTAM 16 INTERMED... Intermediate    CEFAZOLIN <=4 SENSITIVE "><=4 SENSITIVE  Sensitive    CEFEPIME <=1 SENSITIVE "><=1 SENSITIVE  Sensitive    CEFTAZIDIME <=1 SENSITIVE "><=1 SENSITIVE  Sensitive    CEFTRIAXONE <=1 SENSITIVE "><=1 SENSITIVE  Sensitive    CIPROFLOXACIN <=0.25 SENSITIVE "><=0.25 SENS... Sensitive    Extended ESBL NEGATIVE  Sensitive    GENTAMICIN <=1 SENSITIVE "><=1 SENSITIVE  Sensitive     IMIPENEM <=0.25 SENSITIVE "><=0.25 SENS... Sensitive    PIP/TAZO <=4 SENSITIVE "><=4 SENSITIVE  Sensitive    TRIMETH/SULFA <=20 SENSITIVE "><=20 SENSIT... Sensitive    HbA1c 10/10/2016: 7.7   Discharge Instructions: Discharge Instructions    Call MD for:  redness, tenderness, or signs of infection (pain, swelling, redness, odor or green/yellow discharge around incision site)    Complete by:  As directed    Call MD for:  temperature >100.4    Complete by:  As directed    Diet - low sodium heart healthy    Complete by:  As directed    Diet - low sodium heart healthy    Complete by:  As directed    Discharge instructions    Complete by:  As directed    Pick up ciprofloxacin today and begin taking it. You will take it every 12 hours for 2 weeks.  Please call the urology office tomorrow (number listed in this paperwork) to set up a follow up appointment with them later this week.  Please call the [facility] to set up a hospital follow up appointment. They can also be your new primary care providers and have great resources for medications.   Increase activity slowly    Complete by:  As directed    Increase activity slowly    Complete by:  As directed       Signed: Alphonzo Grieve, MD 10/17/2016, 7:49 AM   Pager [phone]

## 2016-10-11 NOTE — Progress Notes (Signed)
   Subjective:  No acute events overnight. Patient states he still has pain in his groin but is stable and he has been able to sleep through the night. Denies chest pain, shortness of breath, abdominal pain, N/V.   Patient states he has not looked at his wound yet and does not think he will in the future.   Objective:  Vital signs in last 24 hours: Vitals:   10/10/16 0950 10/10/16 1718 10/10/16 2147 10/11/16 0455  BP: 139/64 (!) 163/93 131/81 (!) 151/90  Pulse: 82 65 64 63  Resp: 18 18 18 18   Temp: 97.8 F (36.6 C) 99.5 F (37.5 C) 98 F (36.7 C) 98 F (36.7 C)  TempSrc: Oral Oral Oral Oral  SpO2: 100% 100% 96% 98%  Weight:   97.8 kg (215 lb 11.2 oz)   Height:       General: Vital signs reviewed. Patient in no acute distress Cardiovascular: regular rate, rhythm, no murmur appreciated  Pulmonary/Chest: Clear to auscultation bilaterally, no wheezes, rales, or rhonchi. Abdominal: Soft, non-tender, non-distended, BS + GU: right scrotal wound packed and bandaged with small amount of drainage present Extremities: No lower extremity edema bilaterally, Skin: chronic eczematous changes on both legs bilaterally with some crusting and peeling; onychomycosis.  Assessment/Plan:  Active Problems:   Scrotal abscess  Scrotal abscess:  S/p day 2 I&D per urology, on IV vanc. Patient still has tenderness but overall pain is well controlled and improving. Gram stain now showing G negative rods. -appreciate urology recs - tentative d/c date from urology on 11/25 - will need to plan for wound management at discharge -continue IV vanc -follow up cultures -tylenol, ibuprofen and oxy IR PRN for pain control   DM:  CBGs have been well controlled -lantus 35 units daily -SSI TID WC -started on lisinopril, atorvastatin  HTN:  Home regimen is atenolol. -continue atenolol -lisinopril 5mg  daily  HLD: -changed from simvastatin to atorvastatin due to history of diabetes, ASCVD risk 33% -lipid  panel - LDL 100  Microcytic iron deficiency anemia:  MCV of 70 with elevated RDW suggestive of iron-deficiency anemia. He has had iron studies in the past (most recently in Feb 2017 before knee procedure). -start oral iron therapy daily  -consider ferritin check at outpatient appointment once infection has resolved as it may be falsely elevated.   Severe onychomycosis -outpatient follow up with podiatry  DVT ppx: lovenox Code full  Dispo: Anticipated discharge in approximately 1-2 day(s); plans to follow up in [facility] clinic for establishing PCP.   Alphonzo Grieve, MD 10/11/2016, 7:32 AM

## 2016-10-12 LAB — CBC
HCT: 34.7 % — ABNORMAL LOW (ref 39.0–52.0)
HEMOGLOBIN: 11.1 g/dL — AB (ref 13.0–17.0)
MCH: 22.6 pg — ABNORMAL LOW (ref 26.0–34.0)
MCHC: 32 g/dL (ref 30.0–36.0)
MCV: 70.7 fL — ABNORMAL LOW (ref 78.0–100.0)
Platelets: 178 10*3/uL (ref 150–400)
RBC: 4.91 MIL/uL (ref 4.22–5.81)
RDW: 16.8 % — ABNORMAL HIGH (ref 11.5–15.5)
WBC: 6.8 10*3/uL (ref 4.0–10.5)

## 2016-10-12 LAB — BASIC METABOLIC PANEL
Anion gap: 9 (ref 5–15)
BUN: 15 mg/dL (ref 6–20)
CALCIUM: 9.4 mg/dL (ref 8.9–10.3)
CHLORIDE: 103 mmol/L (ref 101–111)
CO2: 26 mmol/L (ref 22–32)
CREATININE: 0.99 mg/dL (ref 0.61–1.24)
GFR calc non Af Amer: >60 mL/min (ref >60)
Glucose, Bld: 92 mg/dL (ref 65–99)
Potassium: 3.9 mmol/L (ref 3.5–5.1)
SODIUM: 138 mmol/L (ref 135–145)

## 2016-10-12 LAB — GLUCOSE, CAPILLARY
GLUCOSE-CAPILLARY: 84 mg/dL (ref 65–99)
GLUCOSE-CAPILLARY: 119 mg/dL — AB (ref 65–99)
GLUCOSE-CAPILLARY: 77 mg/dL (ref 65–99)
Glucose-Capillary: 119 mg/dL — ABNORMAL HIGH (ref 65–99)

## 2016-10-12 MED ORDER — INSULIN ASPART PROT & ASPART (70-30 MIX) 100 UNIT/ML ~~LOC~~ SUSP: 25.0000 [IU] | Freq: Two times a day (BID) | SUBCUTANEOUS | 10 refills | Status: DC

## 2016-10-12 MED ORDER — FERROUS SULFATE 325 (65 FE) MG PO TABS: 325.0000 mg | ORAL_TABLET | Freq: Every day | ORAL | 3 refills | Status: DC

## 2016-10-12 MED ORDER — HYDROCODONE-ACETAMINOPHEN 5-325 MG PO TABS: 1.0000 | ORAL_TABLET | Freq: Three times a day (TID) | ORAL | 0 refills | Status: DC | PRN

## 2016-10-12 MED ORDER — LISINOPRIL 5 MG PO TABS: 5.0000 mg | ORAL_TABLET | Freq: Every day | ORAL | 1 refills | Status: DC

## 2016-10-12 MED ORDER — OXYCODONE HCL 5 MG PO TABS
5.0000 mg | ORAL_TABLET | Freq: Four times a day (QID) | ORAL | Status: DC | PRN
  Administered 2016-10-12 – 2016-10-13 (×2): 5 mg via ORAL
  Filled 2016-10-12 (×2): qty 1

## 2016-10-12 MED ORDER — ATORVASTATIN CALCIUM 40 MG PO TABS: 40.0000 mg | ORAL_TABLET | Freq: Every day | ORAL | 1 refills | Status: DC

## 2016-10-12 MED ORDER — CIPROFLOXACIN HCL 500 MG PO TABS: 500.0000 mg | ORAL_TABLET | Freq: Two times a day (BID) | ORAL | 0 refills | Status: DC

## 2016-10-12 MED ORDER — ASPIRIN 81 MG PO TBEC: 81.0000 mg | DELAYED_RELEASE_TABLET | Freq: Every day | ORAL | 1 refills | Status: DC

## 2016-10-12 MED ORDER — CIPROFLOXACIN HCL 500 MG PO TABS
500.0000 mg | ORAL_TABLET | Freq: Two times a day (BID) | ORAL | Status: DC
  Administered 2016-10-12 – 2016-10-13 (×3): 500 mg via ORAL
  Filled 2016-10-12 (×3): qty 1

## 2016-10-12 NOTE — Progress Notes (Signed)
   Subjective:  No acute events overnight. Patient states he still has pain in his groin but is stable. He understands the need to follow up with urology outpatient in 1 week, and knows that he will be on oral antibiotics after discharge.   Objective:  Vital signs in last 24 hours: Vitals:   10/11/16 1704 10/11/16 2135 10/12/16 0445 10/12/16 0459  BP: 138/84 126/81 135/84   Pulse: 66 66 (!) 57   Resp: 17 18 16    Temp: 98.5 F (36.9 C) 98.3 F (36.8 C) 98.2 F (36.8 C)   TempSrc: Oral Oral Oral   SpO2: 99% 99% 96%   Weight:  216 lb 1.6 oz (98 kg)  216 lb 0.8 oz (98 kg)  Height:       General: Vital signs reviewed. Patient in no acute distress Cardiovascular: regular rate, rhythm, no murmur appreciated  Pulmonary/Chest: Clear to auscultation bilaterally, no wheezes, rales, or rhonchi. Abdominal: Soft, non-tender, non-distended,  GU: right scrotal wound packed and bandaged with small amount of drainage present  Assessment/Plan:  Active Problems:   Scrotal abscess  Scrotal abscess:  Patient still has tenderness but overall pain is well controlled and improving. Gram stain now showing G negative rods- Ecoli sensitive to cipro. Seen by urology today and pt will follow up with them. Transition to 2 weeks of oral cipro. Pt will change outside dressings and urology will change the packing at the appointment. Stopped IV antibiotics today.  Eval by PT and do not recommend any interventions.  -transition to cipro today -follow up with urology    DM:  CBGs have been well controlled -lantus 35 units daily -SSI TID WC- has not required much -started on lisinopril, atorvastatin  HTN:  Home regimen is atenolol. -continue atenolol -lisinopril 5mg  daily  HLD: -changed from simvastatin to atorvastatin due to history of diabetes, ASCVD risk 33% -lipid panel - LDL 100  Microcytic iron deficiency anemia:  MCV of 70 with elevated RDW suggestive of iron-deficiency anemia. He has had  iron studies in the past (most recently in Feb 2017 before knee procedure). -start oral iron therapy daily  -consider ferritin check at outpatient appointment once infection has resolved as it may be falsely elevated.   Severe onychomycosis -outpatient follow up with podiatry  DVT ppx: lovenox Code full  Dispo: Anticipated discharge in approximately today. Burgess Estelle, MD 10/12/2016, 10:48 AM

## 2016-10-12 NOTE — Evaluation (Signed)
Physical Therapy Evaluation Patient Details Name: [name] MRN: [id number] DOB: [birth year] Today's Date: 10/12/2016   History of Present Illness   [name] is a 55yo man with PMH of DM2, HTN, HLD who presented with scrotal pain to the [facility] ED. Found to have scrotal abscess.   Clinical Impression  Pt admitted with above diagnosis. Pt currently with functional limitations due to the deficits listed below (see PT Problem List). Pt ambulated to doorway and back with great effort and heavy UE use on RW. Min A given for safety. Instructed to ambulate to bathroom with nursing in between PT sessions to increase mobility. Will follow acutely but do not expect him to need PT as pain subsides.  Pt will benefit from skilled PT to increase their independence and safety with mobility to allow discharge to the venue listed below.       Follow Up Recommendations No PT follow up    Equipment Recommendations  None recommended by PT    Recommendations for Other Services       Precautions / Restrictions Precautions Precautions: None Restrictions Weight Bearing Restrictions: No      Mobility  Bed Mobility Overal bed mobility: Needs Assistance Bed Mobility: Supine to Sit     Supine to sit: Supervision     General bed mobility comments: pt did not need physical assist to get to EOB but took 3 tries and multiple vc's due to pain with mvmt  Transfers Overall transfer level: Needs assistance Equipment used: Rolling walker (2 wheeled) Transfers: Sit to/from Stand Sit to Stand: Min guard         General transfer comment: pt very painful but able to stand without physical assist  Ambulation/Gait Ambulation/Gait assistance: Min assist Ambulation Distance (Feet): 25 Feet Assistive device: Rolling walker (2 wheeled) Gait Pattern/deviations: Wide base of support;Trunk flexed;Shuffle Gait velocity: decreased Gait velocity interpretation: <1.8 ft/sec, indicative of risk for recurrent  falls General Gait Details: pt needed step by step vc's for use of RW, kept leaning fwd over front of RW, increased wt through [employer] Mobility    Modified Rankin (Stroke Patients Only)       Balance Overall balance assessment: Needs assistance Sitting-balance support: Feet supported Sitting balance-Leahy Scale: Fair Sitting balance - Comments: pt had difficulty maintaining sitting due to pain, had a hard time finding a balance point that he could tolerate   Standing balance support: Bilateral upper extremity supported Standing balance-Leahy Scale: Poor Standing balance comment: unable to maintain standing without UE support                             Pertinent Vitals/Pain Pain Assessment: 0-10 Pain Score: 6  Pain Location: scrotum Pain Descriptors / Indicators: Aching;Discomfort Pain Intervention(s): Limited activity within patient's tolerance;Repositioned    Home Living Family/patient expects to be discharged to:: Private residence Living Arrangements: Spouse/significant other Available Help at Discharge: Friend(s);Available 24 hours/day Type of Home: Apartment Home Access: Level entry     Home Layout: One level Home Equipment: Cane - single point;Wheelchair - [employer] - 2 wheels      Prior Function Level of Independence: Needs assistance   Gait / Transfers Assistance Needed: pt uses RW for ambulation in apt, w/c for out of apt, he reports because of pain in his knees  ADL's / Homemaking Assistance Needed: girlfriend helps him with ADL's but  she is working a lot. Neighbors check on him        Hand Dominance   Dominant Hand: Right    Extremity/Trunk Assessment   Upper Extremity Assessment: Overall WFL for tasks assessed           Lower Extremity Assessment: Overall WFL for tasks assessed      Cervical / Trunk Assessment: Kyphotic  Communication   Communication: No difficulties  Cognition  Arousal/Alertness: Awake/alert Behavior During Therapy: WFL for tasks assessed/performed Overall Cognitive Status: Within Functional Limits for tasks assessed                      General Comments General comments (skin integrity, edema, etc.): instructed pt to ambulate with nursing into bathroom for toileting to increase mobility    Exercises     Assessment/Plan    PT Assessment Patient needs continued PT services  PT Problem List Decreased activity tolerance;Decreased balance;Decreased mobility          PT Treatment Interventions DME instruction;Gait training;Functional mobility training;Therapeutic activities;Therapeutic exercise;Balance training;Patient/family education    PT Goals (Current goals can be found in the Care Plan section)  Acute Rehab PT Goals Patient Stated Goal: return home PT Goal Formulation: With patient Time For Goal Achievement: 10/26/16 Potential to Achieve Goals: Good    Frequency Min 3X/week   Barriers to discharge Decreased caregiver support      Co-evaluation               End of Session Equipment Utilized During Treatment: Gait belt Activity Tolerance: Patient limited by pain Patient left: in chair;with call bell/phone within reach;with chair alarm set Nurse Communication: Mobility status         Time: 4401-0272 PT Time Calculation (min) (ACUTE ONLY): 31 min   Charges:   PT Evaluation $PT Eval Moderate Complexity: 1 Procedure PT Treatments $Gait Training: 8-22 mins   PT G Codes:      Leighton Roach, PT  Acute Rehab Services  [place] 10/12/2016, 10:33 AM

## 2016-10-12 NOTE — Discharge Instructions (Signed)
Please follow up with the primary doctor Please [facility] with the [facility] for dressing changes.  Please follow up with the urologist for the packing changes. Please take the antibiotics for 2 weeks.

## 2016-10-12 NOTE — Progress Notes (Signed)
Right scrotum draining mild blood and much less swollen Change dressing E coli Send home with 10 days of po antibiotics when able Will need dressing changes by home nurse/patient  (Patient should be able to do) See Dr Vernie Shanks in f/up

## 2016-10-13 LAB — GLUCOSE, CAPILLARY
Glucose-Capillary: 84 mg/dL (ref 65–99)
Glucose-Capillary: 134 mg/dL — ABNORMAL HIGH (ref 65–99)

## 2016-10-13 NOTE — Progress Notes (Signed)
Reviewed discharge instructions and medications with patient; all questions answered. Discussed at length the importance of taking antibiotics as prescribed; patient verbalized understanding. Provided extensive teaching regarding dressing changes with teach back.  Patient leaving unit via his wheelchair in stable condition with all of his belongings and printed prescription.  Assessment is as charted.

## 2016-10-13 NOTE — Progress Notes (Signed)
  Subjective: Patient reports that scrotal wound feeling a little bit better.  Less tender.  Objective: Vital signs in last 24 hours: Temp:  [98.1 F (36.7 C)-98.4 F (36.9 C)] 98.1 F (36.7 C) (11/26 0550) Pulse Rate:  [52-62] 52 (11/26 0550) Resp:  [18] 18 (11/26 0550) BP: (132-146)/(73-85) 139/73 (11/26 0550) SpO2:  [98 %-100 %] 100 % (11/26 0550) Weight:  [97.1 kg (214 lb)] 97.1 kg (214 lb) (11/25 2052)  Intake/Output from previous day: 11/25 0701 - 11/26 0700 In: 840 [P.O.:840] Out: 1050 [Urine:1050] Intake/Output this shift: No intake/output data recorded.  Physical Exam:  Constitutional: Vital signs reviewed. WD WN in NAD   Eyes: PERRL, No scleral icterus.   Pulmonary/Chest: Normal effort Abdominal: Soft. Non-tender, non-distended, bowel sounds are normal, no masses, organomegaly, or guarding present.  Genitourinary: Less induration of right-sided scrotal skin.  Less tenderness.  Still significant induration of testicle.   Lab Results:  Recent Labs  10/11/16 0453 10/12/16 0513  HGB 11.2* 11.1*  HCT 35.9* 34.7*   BMET  Recent Labs  10/11/16 0453 10/12/16 0513  NA 137 138  K 3.6 3.9  CL 103 103  CO2 23 26  GLUCOSE 101* 92  BUN 11 15  CREATININE 0.84 0.99  CALCIUM 9.6 9.4   No results for input(s): LABPT, INR in the last 72 hours. No results for input(s): LABURIN in the last 72 hours. Results for orders placed or performed during the hospital encounter of 10/09/16  Aerobic/Anaerobic Culture (surgical/deep wound)     Status: None (Preliminary result)   Collection Time: 10/09/16  2:30 PM  Result Value Ref Range Status   Specimen Description ABSCESS SCROTUM  Final   Special Requests Normal  Final   Gram Stain NO WBC SEEN NO ORGANISMS SEEN   Final   Culture   Final    FEW ESCHERICHIA COLI NO ANAEROBES ISOLATED; CULTURE IN PROGRESS FOR 5 DAYS    Report Status PENDING  Incomplete   Organism ID, Bacteria ESCHERICHIA COLI  Final      Susceptibility    Escherichia coli - MIC*    AMPICILLIN >=32 RESISTANT Resistant     CEFAZOLIN <=4 SENSITIVE Sensitive     CEFEPIME <=1 SENSITIVE Sensitive     CEFTAZIDIME <=1 SENSITIVE Sensitive     CEFTRIAXONE <=1 SENSITIVE Sensitive     CIPROFLOXACIN <=0.25 SENSITIVE Sensitive     GENTAMICIN <=1 SENSITIVE Sensitive     IMIPENEM <=0.25 SENSITIVE Sensitive     TRIMETH/SULFA <=20 SENSITIVE Sensitive     AMPICILLIN/SULBACTAM 16 INTERMEDIATE Intermediate     PIP/TAZO <=4 SENSITIVE Sensitive     Extended ESBL NEGATIVE Sensitive     * FEW ESCHERICHIA COLI    Studies/Results: No results found.  Assessment/Plan:   Status post incision and drainage of scrotal abscess.  Culture positive for sensitive Escherichia coli.  I would suggest sending home on cephalexin 500 milligrams 3 times a day for 10 days, rather than Cipro (cephalosporins generally better tolerated than fluoroquinolones with less side effects).    I notified the patient that he should follow-up in my office later this week.   LOS: 4 days   Franchot Gallo M 10/13/2016, 7:06 AM

## 2016-10-13 NOTE — Progress Notes (Signed)
   Subjective:  Patient endorses feeling better and pain being under good control. He states he is excited to go home to see his family. He was able to walk yesterday without too much discomfort which was reassuring to him. Patient denies further complaints at this time.   Objective:  Vital signs in last 24 hours: Vitals:   10/12/16 0459 10/12/16 1000 10/12/16 2052 10/13/16 0550  BP:  132/83 (!) 146/85 139/73  Pulse:  (!) 58 62 (!) 52  Resp:  18 18 18   Temp:  98.2 F (36.8 C) 98.4 F (36.9 C) 98.1 F (36.7 C)  TempSrc:  Oral Oral Oral  SpO2:  98% 98% 100%  Weight: 98 kg (216 lb 0.8 oz)  97.1 kg (214 lb)   Height:       General: Vital signs reviewed. Patient in no acute distress Cardiovascular: regular rate, rhythm, no murmur appreciated  Pulmonary/Chest: Clear to auscultation bilaterally, no wheezing or crackles appreciated. Abdominal: Soft, non-tender, non-distended,  GU: right scrotal wound packed and bandaged with small amount of drainage present  Assessment/Plan:  Active Problems:   Scrotal abscess  Scrotal abscess:  Patient still has tenderness but overall pain is well controlled and improving. Gram stain now showing G negative rods- Ecoli sensitive to cipro. Seen by urology today and pt will follow up with them. Transition to 2 weeks of oral cipro. Pt will change outside dressings and urology will change the packing at the appointment. Stopped IV antibiotics today.  Eval by PT and do not recommend any interventions. -Cipro x 14 days - End date 12/08 -follow up with urology - patient to call to set up appointment on 11/27.  DM:  CBGs have been well controlled -lantus 35 units daily -SSI TID WC- has not required much -started on lisinopril, atorvastatin  HTN:  Home regimen is atenolol. -continue atenolol -lisinopril 5mg  daily  HLD: -changed from simvastatin to atorvastatin due to history of diabetes, ASCVD risk 33% -lipid panel - LDL 100  Microcytic iron  deficiency anemia:  MCV of 70 with elevated RDW suggestive of iron-deficiency anemia. He has had iron studies in the past (most recently in Feb 2017 before knee procedure). -start oral iron therapy daily  -consider ferritin check at outpatient appointment once infection has resolved as it may be falsely elevated.   Severe onychomycosis -outpatient follow up with podiatry  DVT ppx: lovenox Code full  Dispo: Anticipated discharge today. Patient to follow up with [facility] and urology - he is to call both tomorrow to set up appointments.  Alphonzo Grieve, MD 10/13/2016, 8:41 AM

## 2016-10-14 ENCOUNTER — Telehealth: Payer: Self-pay

## 2016-10-14 LAB — AEROBIC/ANAEROBIC CULTURE (SURGICAL/DEEP WOUND): SPECIAL REQUESTS: NORMAL

## 2016-10-14 LAB — AEROBIC/ANAEROBIC CULTURE W GRAM STAIN (SURGICAL/DEEP WOUND): Gram Stain: NONE SEEN

## 2016-10-14 NOTE — Telephone Encounter (Signed)
Message received from Laurena Slimmer, RN CM requesting a hospital follow up appointment for  the patient at [facility]. Call placed to # [phone] and the call was answered as [place] [facility] [employer] and this CM was informed that Mr Willner no longer works there. No other contact # for rhe patient was noted.  Call placed to his emergency contact, [name] ( mother) # [phone] and the phone rang endlessly, no option to leave a voicemail message.   Update provided to Wendi Maya, RN CM

## 2016-10-16 ENCOUNTER — Telehealth: Payer: Self-pay

## 2016-10-16 NOTE — Telephone Encounter (Signed)
Call received from [place], [place] with [facility]. She stated that she was with the patient and had the phone on speaker. The patient then confirmed his identity/birth date. He was interested in scheduling an appointment at [facility] and an appointment was scheduled for 10/18/16 @ 1630. This CM and Linus Orn stressed the importance of coming to that appointment. This CM also explained the services provided at [facility] including pharmacy assistance and social work.  The patient stated that he can take a bus to the clinic but it may be difficult as he is in a wheelchair but if he is not able to take the bus, he will take a cab. He noted that there is not anyone available to drive him to the clinic. He said that his significant other, [name], can meet him at the clinic because she works until 1500/1530. The clinic address was then confirmed.   This CM informed him that the contact # that we have for him is for [facility]. He said that he is no longer there and provided the cell # for [name] # [phone].  He said that she works during the day but a message can be left and she will get it to him,.  Linus Orn stated that she will call the patient on Friday, 10/18/16 in the morning to remind him of the importance of keeping the appointment at [facility].    Update provided to Laurena Slimmer, RN CM

## 2016-10-18 ENCOUNTER — Ambulatory Visit: Payer: Self-pay | Attending: Physician Assistant | Admitting: Physician Assistant

## 2016-10-18 VITALS — BP 140/108 | HR 87 | Temp 98.6°F | Resp 16

## 2016-10-18 DIAGNOSIS — N492 Inflammatory disorders of scrotum: Secondary | ICD-10-CM | POA: Insufficient documentation

## 2016-10-18 DIAGNOSIS — Z7982 Long term (current) use of aspirin: Secondary | ICD-10-CM | POA: Insufficient documentation

## 2016-10-18 DIAGNOSIS — I10 Essential (primary) hypertension: Secondary | ICD-10-CM | POA: Insufficient documentation

## 2016-10-18 DIAGNOSIS — E7800 Pure hypercholesterolemia, unspecified: Secondary | ICD-10-CM | POA: Insufficient documentation

## 2016-10-18 DIAGNOSIS — Z794 Long term (current) use of insulin: Secondary | ICD-10-CM | POA: Insufficient documentation

## 2016-10-18 DIAGNOSIS — IMO0001 Reserved for inherently not codable concepts without codable children: Secondary | ICD-10-CM

## 2016-10-18 DIAGNOSIS — Z79899 Other long term (current) drug therapy: Secondary | ICD-10-CM | POA: Insufficient documentation

## 2016-10-18 DIAGNOSIS — E119 Type 2 diabetes mellitus without complications: Secondary | ICD-10-CM | POA: Insufficient documentation

## 2016-10-18 DIAGNOSIS — M109 Gout, unspecified: Secondary | ICD-10-CM | POA: Insufficient documentation

## 2016-10-18 DIAGNOSIS — D509 Iron deficiency anemia, unspecified: Secondary | ICD-10-CM | POA: Insufficient documentation

## 2016-10-18 LAB — CBC WITH DIFFERENTIAL/PLATELET
BASOS ABS: 0 {cells}/uL (ref 0–200)
Basophils Relative: 0 %
EOS ABS: 68 {cells}/uL (ref 15–500)
Eosinophils Relative: 1 %
HEMATOCRIT: 36.4 % — AB (ref 38.5–50.0)
HEMOGLOBIN: 11.5 g/dL — AB (ref 13.2–17.1)
LYMPHS ABS: 1972 {cells}/uL (ref 850–3900)
Lymphocytes Relative: 29 %
MCH: 22.7 pg — AB (ref 27.0–33.0)
MCHC: 31.6 g/dL — AB (ref 32.0–36.0)
MCV: 71.9 fL — AB (ref 80.0–100.0)
MONO ABS: 408 {cells}/uL (ref 200–950)
MPV: 9.4 fL (ref 7.5–12.5)
Monocytes Relative: 6 %
NEUTROS PCT: 64 %
Neutro Abs: 4352 cells/uL (ref 1500–7800)
Platelets: 241 10*3/uL (ref 140–400)
RBC: 5.06 MIL/uL (ref 4.20–5.80)
RDW: 18 % — ABNORMAL HIGH (ref 11.0–15.0)
WBC: 6.8 10*3/uL (ref 3.8–10.8)

## 2016-10-18 LAB — GLUCOSE, POCT (MANUAL RESULT ENTRY): POC Glucose: 80 mg/dl (ref 70–99)

## 2016-10-18 MED ORDER — ATORVASTATIN CALCIUM 40 MG PO TABS: 40.0000 mg | ORAL_TABLET | Freq: Every day | ORAL | 1 refills | Status: DC

## 2016-10-18 MED ORDER — "INSULIN SYRINGE-NEEDLE U-100 30G X 5/16"" 0.5 ML MISC": 1 refills | Status: DC

## 2016-10-18 MED ORDER — ASPIRIN 81 MG PO TBEC: 81.0000 mg | DELAYED_RELEASE_TABLET | Freq: Every day | ORAL | 1 refills | Status: DC

## 2016-10-18 MED ORDER — TRUE METRIX METER W/DEVICE KIT
1.0000 | PACK | Freq: Three times a day (TID) | 0 refills | Status: DC
Start: 2016-10-18 — End: 2018-08-10

## 2016-10-18 MED ORDER — INSULIN ASPART PROT & ASPART (70-30 MIX) 100 UNIT/ML ~~LOC~~ SUSP: 25.0000 [IU] | Freq: Two times a day (BID) | SUBCUTANEOUS | 10 refills | Status: DC

## 2016-10-18 MED ORDER — TRUEPLUS LANCETS 28G MISC: 1.0000 | Freq: Three times a day (TID) | 2 refills | Status: DC

## 2016-10-18 MED ORDER — METFORMIN HCL 500 MG PO TABS: 500.0000 mg | ORAL_TABLET | Freq: Two times a day (BID) | ORAL | 2 refills | Status: DC

## 2016-10-18 MED ORDER — FERROUS SULFATE 325 (65 FE) MG PO TABS: 325.0000 mg | ORAL_TABLET | Freq: Every day | ORAL | 3 refills | Status: DC

## 2016-10-18 MED ORDER — GLUCOSE BLOOD VI STRP: ORAL_STRIP | 12 refills | Status: DC

## 2016-10-18 MED ORDER — ATENOLOL 50 MG PO TABS: 50.0000 mg | ORAL_TABLET | Freq: Every day | ORAL | 2 refills | Status: DC

## 2016-10-18 MED FILL — TRUE METRIX TEST STRIP: 30 days supply | Qty: 100 | Fill #0

## 2016-10-18 MED FILL — ATORVASTATIN 40 MG TABLET: 40 | 30 days supply | Qty: 30 | Fill #0

## 2016-10-18 MED FILL — FERROUS SULFATE 325 MG TAB: 325 (65 FE) | 30 days supply | Qty: 30 | Fill #0

## 2016-10-18 MED FILL — metFORMIN HCL 500 MG TABS: 500 | 30 days supply | Qty: 60 | Fill #0

## 2016-10-18 MED FILL — TRUEplus LANCETS 28G MISC: 30 days supply | Qty: 100 | Fill #0

## 2016-10-18 MED FILL — TRUE METRIX BLOOD GLUCOSE M: W/DEVICE | 1 days supply | Qty: 1 | Fill #0

## 2016-10-18 MED FILL — !NOVOLOG MIX 70/30 VIAL: 70-30/ML | 20 days supply | Qty: 10 | Fill #0

## 2016-10-18 MED FILL — TRUEPLUS SYR 0.5ML 30GX5/16: 30G X 5/16" | 30 days supply | Qty: 100 | Fill #0

## 2016-10-18 NOTE — Patient Instructions (Addendum)
Make appt for next week:    Jorja Loa, MD Follow up.   Specialty:  Urology Why:  Call for appointment to be seen later this week to check your wound Contact information: [place] [address] [phone]  Check blood sugars 3 times daily and record and bring to next visit.

## 2016-10-18 NOTE — Progress Notes (Signed)
[name], is a 55 y.o. male  WGY:[id number]  XBL:[id number]  DOB - [birth year]  Subjective:  Chief Complaint and HPI: [name] is a 55 y.o. male here today to establish care and for a follow up visit after being inpatient for scrotal abscess 10/09/2016-10/13/2016. He was discharged on Cipro.  Home health is coming to his house 2 X/week. His gf is helping him change the dressings and with wound care daily.  He denies f/c.  Pain is improving.  He is feeling better overall.  He was supposed to f/up with urology this week but did not schedule the appt.  He needs RFs on all his meds and needs a glucometer.   From Hospital note:  PMH of DM2, HTN, HLD who presented with scrotal pain to the [facility] ED.  In the ED, he was evaluated by Urology with an ultrasound and an abscess was drained with packing left in place.  He further noted to have chronic LE wounds, and severely dry skin which he reports being there for many years.  He was started on IV vancomycin and admitted.   He is compliant on his insulin regimen and meds.  He is out of atenolol and lisinopril for BP.  He is not having any new complaints today.  He says he has Type 1 DM, the ED note says type 2.  Due to the fact he is on metformin and has an A1C of 7.7 and does not check his sugars regularly, and no ketoacidosis,  I doubt he is type 1, but this is unclear. He is a new patient to [place] today.    Patient is a poor historian.   ED/Hospital notes reviewed.     ROS:   Constitutional:  No f/c, No night sweats, No unexplained weight loss. EENT:  No vision changes, No blurry vision, No hearing changes. No mouth, throat, or ear problems.  Respiratory: No cough, No SOB Cardiac: No CP, no palpitations GI:  No abd pain, No N/V/D. GU: No Urinary s/sx Musculoskeletal: No joint pain Neuro: No headache, no dizziness, no motor weakness.  Skin: No rash Endocrine:  No polydipsia. No polyuria.  Psych: Denies SI/HI  No problems  updated.  ALLERGIES: No Known Allergies  PAST MEDICAL HISTORY: Past Medical History:  Diagnosis Date  . Gouty bursitis, elbow  10/18/2015  . Hx of gout   . Hyperlipidemia   . Hypertension   . Knee pain, chronic   . Type II diabetes mellitus ([place])     MEDICATIONS AT HOME: Prior to Admission medications   Medication Sig Start Date End Date Taking? Authorizing Provider  colchicine 0.6 MG tablet Take 0.6 mg by mouth daily.   Yes Historical Provider, MD  aspirin 81 MG EC tablet Take 1 tablet (81 mg total) by mouth daily. 10/18/16   Argentina Donovan, PA-C  atenolol (TENORMIN) 50 MG tablet Take 1 tablet (50 mg total) by mouth daily. 10/18/16   Argentina Donovan, PA-C  atorvastatin (LIPITOR) 40 MG tablet Take 1 tablet (40 mg total) by mouth daily at 6 PM. 10/18/16   Argentina Donovan, PA-C  bisacodyl (DULCOLAX) 5 MG EC tablet Take 1 tablet (5 mg total) by mouth daily as needed for moderate constipation. 12/31/15   Domenic Polite, MD  Blood Glucose Monitoring Suppl (TRUE METRIX METER) w/Device KIT 1 Device by Does not apply route 3 (three) times daily. 10/18/16   Argentina Donovan, PA-C  ciprofloxacin (CIPRO) 500 MG tablet Take 1 tablet (  500 mg total) by mouth 2 (two) times daily. For 2 weeks starting 10/12/16 10/12/16   Burgess Estelle, MD  ferrous sulfate 325 (65 FE) MG tablet Take 1 tablet (325 mg total) by mouth daily with breakfast. 10/18/16   Argentina Donovan, PA-C  glucose blood (TRUE METRIX BLOOD GLUCOSE TEST) test strip Use as instructed 10/18/16   Argentina Donovan, PA-C  HYDROcodone-acetaminophen (NORCO) 5-325 MG tablet Take 1 tablet by mouth every 8 (eight) hours as needed for moderate pain. 10/12/16   Burgess Estelle, MD  insulin aspart protamine- aspart (NOVOLOG MIX 70/30) (70-30) 100 UNIT/ML injection Inject 0.25 mLs (25 Units total) into the skin 2 (two) times daily with a meal. 10/18/16   Argentina Donovan, PA-C  Insulin Syringe-Needle U-100 (SAFETY INSULIN SYRINGES) 30G X 5/16" 0.5 ML MISC Use  for insulin injection as directed 10/18/16   Argentina Donovan, PA-C  lisinopril (PRINIVIL,ZESTRIL) 5 MG tablet Take 1 tablet (5 mg total) by mouth daily. 10/13/16   Burgess Estelle, MD  metFORMIN (GLUCOPHAGE) 500 MG tablet Take 1 tablet (500 mg total) by mouth 2 (two) times daily with a meal. 10/18/16   Argentina Donovan, PA-C  TRUEPLUS LANCETS 28G MISC 1 Device by Does not apply route 3 (three) times daily. 10/18/16   Argentina Donovan, PA-C     Objective:  EXAM:   Vitals:   10/18/16 1609 10/18/16 1617  BP: (!) 169/117 (!) 140/108  Pulse: 87   Resp: 16   Temp: 98.6 F (37 C)   TempSrc: Oral   SpO2: 98%     General appearance : A&OX3. NAD. Non-toxic-appearing, in a wheel chair HEENT: Atraumatic and Normocephalic.  PERRLA. EOM intact.   Neck: supple, no JVD. No cervical lymphadenopathy. No thyromegaly Chest/Lungs:  Breathing-non-labored, Good air entry bilaterally, breath sounds normal without rales, rhonchi, or wheezing  CVS: S1 S2 regular, no murmurs, gallops, rubs  I did not examine the scrotum per the patient's request and that home health and his gf is checking regularly.   Extremities: Bilateral Lower Ext shows no edema, both legs are warm to touch with = pulse throughout Neurology:  CN II-XII grossly intact, Non focal.   Psych:  TP linear. J/I poor to fair. Normal speech. Appropriate eye contact and affect.  Skin:  No Rash  Data Review Lab Results  Component Value Date   HGBA1C 7.7 (H) 10/10/2016   HGBA1C 6.9 (H) 12/24/2015   HGBA1C 6.7 10/13/2013     Assessment & Plan   1. IDDM (insulin dependent diabetes mellitus) (HCC) - POCT glucose (manual entry) - insulin aspart protamine- aspart (NOVOLOG MIX 70/30) (70-30) 100 UNIT/ML injection; Inject 0.25 mLs (25 Units total) into the skin 2 (two) times daily with a meal.  Dispense: 10 mL; Refill: 10 - metFORMIN (GLUCOPHAGE) 500 MG tablet; Take 1 tablet (500 mg total) by mouth 2 (two) times daily with a meal.  Dispense: 60  tablet; Refill: 2 I am not sure why he is on metformin since he is listed as type 1 diabetic.  I discussed this with the clinical pharmacist, Nicoletta Ba, and we will leave him on it for now and allow the PCP he is assigned to decide whether or not it should be continued from there.   - Blood Glucose Monitoring Suppl (TRUE METRIX METER) w/Device KIT; 1 Device by Does not apply route 3 (three) times daily.  Dispense: 1 kit; Refill: 0 - glucose blood (TRUE METRIX BLOOD GLUCOSE TEST) test strip; Use  as instructed  Dispense: 100 each; Refill: 12 - TRUEPLUS LANCETS 28G MISC; 1 Device by Does not apply route 3 (three) times daily.  Dispense: 90 each; Refill: 2 - Insulin Syringe-Needle U-100 (SAFETY INSULIN SYRINGES) 30G X 5/16" 0.5 ML MISC; Use for insulin injection as directed  Dispense: 100 each; Refill: 1  2. Essential hypertension Out of meds-uncontrolled.   - aspirin 81 MG EC tablet; Take 1 tablet (81 mg total) by mouth daily.  Dispense: 90 tablet; Refill: 1 - atenolol (TENORMIN) 50 MG tablet; Take 1 tablet (50 mg total) by mouth daily.  Dispense: 90 tablet; Refill: 2 - Comprehensive metabolic panel  3. Scrotal abscess Continue Cipro and home health.  Make f/up appt eityh Dr. Corrinne Eagle information provided again.    4. Microcytic anemia - ferrous sulfate 325 (65 FE) MG tablet; Take 1 tablet (325 mg total) by mouth daily with breakfast.  Dispense: 30 tablet; Refill: 3 - CBC with Differential/Platelet  5. Pure hypercholesterolemia - atorvastatin (LIPITOR) 40 MG tablet; Take 1 tablet (40 mg total) by mouth daily at 6 PM.  Dispense: 30 tablet; Refill: 1   Patient have been counseled extensively about nutrition and exercise and adequate water intake.    Return in about 3 weeks (around 11/08/2016) for establish care; f/up diabetes and BP.  The patient was given clear instructions to go to ER or return to medical center if symptoms don't improve, worsen or new problems develop. The patient  verbalized understanding. The patient was told to call to get lab results if they haven't heard anything in the next week.     Freeman Caldron, PA-C [facility] [place]   10/18/2016, 4:50 PMPatient ID: [name], male   DOB: 12/27/1960, 55 y.o.   MRN: [id number]

## 2016-10-19 LAB — COMPREHENSIVE METABOLIC PANEL
ALBUMIN: 3.9 g/dL (ref 3.6–5.1)
ALT: 11 U/L (ref 9–46)
AST: 14 U/L (ref 10–35)
Alkaline Phosphatase: 77 U/L (ref 40–115)
BILIRUBIN TOTAL: 0.6 mg/dL (ref 0.2–1.2)
BUN: 9 mg/dL (ref 7–25)
CALCIUM: 9.6 mg/dL (ref 8.6–10.3)
CO2: 26 mmol/L (ref 20–31)
Chloride: 105 mmol/L (ref 98–110)
Creat: 0.95 mg/dL (ref 0.70–1.33)
Glucose, Bld: 79 mg/dL (ref 65–99)
POTASSIUM: 3.5 mmol/L (ref 3.5–5.3)
Sodium: 141 mmol/L (ref 135–146)
Total Protein: 8.2 g/dL — ABNORMAL HIGH (ref 6.1–8.1)

## 2016-10-22 ENCOUNTER — Other Ambulatory Visit: Payer: Self-pay | Admitting: Pharmacist

## 2016-10-22 MED ORDER — METOPROLOL SUCCINATE ER 25 MG PO TB24: 25.0000 mg | ORAL_TABLET | Freq: Every day | ORAL | 0 refills | Status: DC

## 2016-10-22 MED FILL — METOPROLOL SUCC ER 25 MG TA: 25 | 30 days supply | Qty: 30 | Fill #0

## 2016-10-22 NOTE — Telephone Encounter (Signed)
Atenolol on backorder, switched to metoprolol succinate at equivalent dose. Patient will need office visit to assess response to metoprolol

## 2016-10-25 ENCOUNTER — Telehealth: Payer: Self-pay

## 2016-10-25 NOTE — Telephone Encounter (Signed)
Contacted pt to go over lab results pt didn't answer lvm asking him to give me a call back at his earliest convenience

## 2016-10-30 ENCOUNTER — Telehealth: Payer: Self-pay | Admitting: Internal Medicine

## 2016-10-30 NOTE — Telephone Encounter (Signed)
Amy stated his bp has not been good since he has started the metoprolol. Amy stated she will go back an check an patient bp tomorrow or friday

## 2016-10-30 NOTE — Telephone Encounter (Signed)
Amy from [place] called to speak with nurse regarding pt's blood pressure reading. Pt bp yesterday was 115/100 and today its 170/110. Pt is currently taking to medications for bp. Amy would like to know if PCP wants to increase dosage or prescribe new medication. Please follow up.

## 2016-10-30 NOTE — Telephone Encounter (Signed)
Will forward to Dr. Langeland 

## 2016-10-30 NOTE — Telephone Encounter (Signed)
Please call. I would advised rechecking the blood pressure today, maybe he was stressed. No changes on meds for now. Would recd keeping appt w/ Dr Jarold Song on 12/21. thx

## 2016-11-01 ENCOUNTER — Telehealth: Payer: Self-pay

## 2016-11-01 NOTE — Telephone Encounter (Signed)
Will forward to [employer]

## 2016-11-01 NOTE — Telephone Encounter (Signed)
Patient contacted regarding his appt and bringing all his meds in with him when he comes.  Writer LVM and encouraged him to call back if he has any questions.

## 2016-11-01 NOTE — Telephone Encounter (Signed)
Amy from [place] called to let the nurse know that pt. BP on 12/14 was 160/100 and today is was 170/108.  She also stated that pt. Heart rate was 60 and irregular. Pt. Was also having headaches. She would like to know if the PCP would like to increase his lisinopril But he is only taking 5 mg. Please f/u

## 2016-11-01 NOTE — Telephone Encounter (Signed)
Looks like his BP swings from high to low. I will need him to bring in all his meds for his visit with me.

## 2016-11-04 ENCOUNTER — Telehealth: Payer: Self-pay | Admitting: Internal Medicine

## 2016-11-04 NOTE — Telephone Encounter (Signed)
Will forward to [facility]

## 2016-11-04 NOTE — Telephone Encounter (Signed)
Amy, from [facility] called to speak with nurse to give her patient's bp reading 160/100.

## 2016-11-04 NOTE — Telephone Encounter (Signed)
Dr. Jarold Song aware.

## 2016-11-05 NOTE — Telephone Encounter (Signed)
We'll see him on his scheduled appointment.

## 2016-11-05 NOTE — Telephone Encounter (Signed)
Per Dr. Jarold Song patient will be seen for his scheduled appt.

## 2016-11-07 ENCOUNTER — Encounter: Payer: Self-pay | Admitting: Family Medicine

## 2016-11-07 ENCOUNTER — Ambulatory Visit: Payer: Self-pay | Attending: Family Medicine | Admitting: Family Medicine

## 2016-11-07 VITALS — BP 170/104 | HR 69 | Temp 98.3°F | Ht 65.0 in

## 2016-11-07 DIAGNOSIS — N492 Inflammatory disorders of scrotum: Secondary | ICD-10-CM | POA: Insufficient documentation

## 2016-11-07 DIAGNOSIS — E785 Hyperlipidemia, unspecified: Secondary | ICD-10-CM | POA: Insufficient documentation

## 2016-11-07 DIAGNOSIS — M25561 Pain in right knee: Secondary | ICD-10-CM

## 2016-11-07 DIAGNOSIS — M170 Bilateral primary osteoarthritis of knee: Secondary | ICD-10-CM | POA: Insufficient documentation

## 2016-11-07 DIAGNOSIS — E119 Type 2 diabetes mellitus without complications: Secondary | ICD-10-CM | POA: Insufficient documentation

## 2016-11-07 DIAGNOSIS — M25562 Pain in left knee: Secondary | ICD-10-CM

## 2016-11-07 DIAGNOSIS — G8929 Other chronic pain: Secondary | ICD-10-CM

## 2016-11-07 DIAGNOSIS — Z7982 Long term (current) use of aspirin: Secondary | ICD-10-CM | POA: Insufficient documentation

## 2016-11-07 DIAGNOSIS — I10 Essential (primary) hypertension: Secondary | ICD-10-CM | POA: Insufficient documentation

## 2016-11-07 DIAGNOSIS — Z794 Long term (current) use of insulin: Secondary | ICD-10-CM | POA: Insufficient documentation

## 2016-11-07 DIAGNOSIS — E1169 Type 2 diabetes mellitus with other specified complication: Secondary | ICD-10-CM

## 2016-11-07 LAB — GLUCOSE, POCT (MANUAL RESULT ENTRY): POC GLUCOSE: 124 mg/dL — AB (ref 70–99)

## 2016-11-07 MED ORDER — LISINOPRIL 20 MG PO TABS: 20.0000 mg | ORAL_TABLET | Freq: Every day | ORAL | 3 refills | Status: DC

## 2016-11-07 MED FILL — LISINOPRIL 20 MG TABLET: 20 | 30 days supply | Qty: 30 | Fill #0

## 2016-11-07 NOTE — Progress Notes (Signed)
Not taking atenolol

## 2016-11-07 NOTE — Patient Instructions (Signed)
Diabetes Mellitus and Food It is important for you to manage your blood sugar (glucose) level. Your blood glucose level can be greatly affected by what you eat. Eating healthier foods in the appropriate amounts throughout the day at about the same time each day will help you control your blood glucose level. It can also help slow or prevent worsening of your diabetes mellitus. Healthy eating may even help you improve the level of your blood pressure and reach or maintain a healthy weight. General recommendations for healthful eating and cooking habits include:  Eating meals and snacks regularly. Avoid going long periods of time without eating to lose weight.  Eating a diet that consists mainly of plant-based foods, such as fruits, vegetables, nuts, legumes, and whole grains.  Using low-heat cooking methods, such as baking, instead of high-heat cooking methods, such as deep frying.  Work with your dietitian to make sure you understand how to use the Nutrition Facts information on food labels. How can food affect me? Carbohydrates Carbohydrates affect your blood glucose level more than any other type of food. Your dietitian will help you determine how many carbohydrates to eat at each meal and teach you how to count carbohydrates. Counting carbohydrates is important to keep your blood glucose at a healthy level, especially if you are using insulin or taking certain medicines for diabetes mellitus. Alcohol Alcohol can cause sudden decreases in blood glucose (hypoglycemia), especially if you use insulin or take certain medicines for diabetes mellitus. Hypoglycemia can be a life-threatening condition. Symptoms of hypoglycemia (sleepiness, dizziness, and disorientation) are similar to symptoms of having too much alcohol. If your health care provider has given you approval to drink alcohol, do so in moderation and use the following guidelines:  Women should not have more than one drink per day, and men  should not have more than two drinks per day. One drink is equal to: ? 12 oz of beer. ? 5 oz of wine. ? 1 oz of hard liquor.  Do not drink on an empty stomach.  Keep yourself hydrated. Have water, diet soda, or unsweetened iced tea.  Regular soda, juice, and other mixers might contain a lot of carbohydrates and should be counted.  What foods are not recommended? As you make food choices, it is important to remember that all foods are not the same. Some foods have fewer nutrients per serving than other foods, even though they might have the same number of calories or carbohydrates. It is difficult to get your body what it needs when you eat foods with fewer nutrients. Examples of foods that you should avoid that are high in calories and carbohydrates but low in nutrients include:  Trans fats (most processed foods list trans fats on the Nutrition Facts label).  Regular soda.  Juice.  Candy.  Sweets, such as cake, pie, doughnuts, and cookies.  Fried foods.  What foods can I eat? Eat nutrient-rich foods, which will nourish your body and keep you healthy. The food you should eat also will depend on several factors, including:  The calories you need.  The medicines you take.  Your weight.  Your blood glucose level.  Your blood pressure level.  Your cholesterol level.  You should eat a variety of foods, including:  Protein. ? Lean cuts of meat. ? Proteins low in saturated fats, such as fish, egg whites, and beans. Avoid processed meats.  Fruits and vegetables. ? Fruits and vegetables that may help control blood glucose levels, such as apples,   mangoes, and yams.  Dairy products. ? Choose fat-free or low-fat dairy products, such as milk, yogurt, and cheese.  Grains, bread, pasta, and rice. ? Choose whole grain products, such as multigrain bread, whole oats, and brown rice. These foods may help control blood pressure.  Fats. ? Foods containing healthful fats, such as  nuts, avocado, olive oil, canola oil, and fish.  Does everyone with diabetes mellitus have the same meal plan? Because every person with diabetes mellitus is different, there is not one meal plan that works for everyone. It is very important that you meet with a dietitian who will help you create a meal plan that is just right for you. This information is not intended to replace advice given to you by your health care provider. Make sure you discuss any questions you have with your health care provider. Document Released: 08/01/2005 Document Revised: 04/11/2016 Document Reviewed: 10/01/2013 Elsevier Interactive Patient Education  2017 [employer]  

## 2016-11-07 NOTE — Progress Notes (Signed)
Subjective:  Patient ID: [name], male    DOB: Oct 18, 1961  Age: 55 y.o. MRN: [id number]  CC: Diabetes and Hypertension (BP has been running high for a week)   HPI [name] is a 55 year old male with a history of type 2 diabetes mellitus, hypertension, hyperlipidemia, osteoarthritis of the knee who presents today for follow-up visit.  There have been calls from his home care nurse with reports of elevated blood pressure despite compliance with antihypertensives. The patient informs me he has been taking 10 mg of lisinopril and his blood pressure is still 170/104 today.  He also has a scrotal abscess which is being dressed by his home care nurse daily; he is yet to follow-up with his surgeon- Dr Luberta Robertson but denies any drainage from abscess and reports improvement. Denies fever.  He currently has pain in both knees and is unable to bear weight on the knees ever since his knee surgery in 2016; he ambulates with a walker or wheelchair.  Past Medical History:  Diagnosis Date  . Gouty bursitis, elbow  10/18/2015  . Hx of gout   . Hyperlipidemia   . Hypertension   . Knee pain, chronic   . Type II diabetes mellitus ([place])     Past Surgical History:  Procedure Laterality Date  . KNEE ARTHROSCOPY Bilateral 12/29/2015   Procedure: ARTHROSCOPY KNEE BILATERAL WITH  PARTIAL MEDIAL AND LATERAL MENISCECTOMY AND FOUR COMPARTMENT SYNOVECTOMY , CHONDRALPLASTIES, PATELLA-FEMORAL CHONDRALPLASTIES BILATERALLY;  Surgeon: Dorna Leitz, MD;  Location: [facility];  Service: Orthopedics;  Laterality: Bilateral;  . NO PAST SURGERIES      No Known Allergies   Outpatient Medications Prior to Visit  Medication Sig Dispense Refill  . aspirin 81 MG EC tablet Take 1 tablet (81 mg total) by mouth daily. 90 tablet 1  . atorvastatin (LIPITOR) 40 MG tablet Take 1 tablet (40 mg total) by mouth daily at 6 PM. 30 tablet 1  . bisacodyl (DULCOLAX) 5 MG EC tablet Take 1 tablet (5 mg total) by mouth daily as  needed for moderate constipation. 30 tablet 0  . Blood Glucose Monitoring Suppl (TRUE METRIX METER) w/Device KIT 1 Device by Does not apply route 3 (three) times daily. 1 kit 0  . ferrous sulfate 325 (65 FE) MG tablet Take 1 tablet (325 mg total) by mouth daily with breakfast. 30 tablet 3  . glucose blood (TRUE METRIX BLOOD GLUCOSE TEST) test strip Use as instructed 100 each 12  . insulin aspart protamine- aspart (NOVOLOG MIX 70/30) (70-30) 100 UNIT/ML injection Inject 0.25 mLs (25 Units total) into the skin 2 (two) times daily with a meal. 10 mL 10  . Insulin Syringe-Needle U-100 (SAFETY INSULIN SYRINGES) 30G X 5/16" 0.5 ML MISC Use for insulin injection as directed 100 each 1  . metFORMIN (GLUCOPHAGE) 500 MG tablet Take 1 tablet (500 mg total) by mouth 2 (two) times daily with a meal. 60 tablet 2  . metoprolol succinate (TOPROL-XL) 25 MG 24 hr tablet Take 1 tablet (25 mg total) by mouth daily. 30 tablet 0  . TRUEPLUS LANCETS 28G MISC 1 Device by Does not apply route 3 (three) times daily. 90 each 2  . lisinopril (PRINIVIL,ZESTRIL) 5 MG tablet Take 1 tablet (5 mg total) by mouth daily. 30 tablet 1  . colchicine 0.6 MG tablet Take 0.6 mg by mouth daily.    Marland Kitchen HYDROcodone-acetaminophen (NORCO) 5-325 MG tablet Take 1 tablet by mouth every 8 (eight) hours as needed for moderate pain. (  Patient not taking: Reported on 11/07/2016) 20 tablet 0  . atenolol (TENORMIN) 50 MG tablet Take 1 tablet (50 mg total) by mouth daily. (Patient not taking: Reported on 11/07/2016) 90 tablet 2  . ciprofloxacin (CIPRO) 500 MG tablet Take 1 tablet (500 mg total) by mouth 2 (two) times daily. For 2 weeks starting 10/12/16 28 tablet 0   No facility-administered medications prior to visit.     ROS Review of Systems  Constitutional: Negative for activity change and appetite change.  HENT: Negative for sinus pressure and sore throat.   Eyes: Negative for visual disturbance.  Respiratory: Negative for cough, chest tightness  and shortness of breath.   Cardiovascular: Negative for chest pain and leg swelling.  Gastrointestinal: Negative for abdominal distention, abdominal pain, constipation and diarrhea.  Endocrine: Negative.   Genitourinary: Negative for dysuria.  Musculoskeletal:       See hpi  Skin: Positive for wound. Negative for rash.  Allergic/Immunologic: Negative.   Neurological: Negative for weakness, light-headedness and numbness.  Psychiatric/Behavioral: Negative for dysphoric mood and suicidal ideas.    Objective:  BP (!) 170/104 (BP Location: Right Arm, Patient Position: Sitting, Cuff Size: Large)   Pulse 69   Temp 98.3 F (36.8 C) (Oral)   Ht _0  (1.651 m)   SpO2 99%   BP/Weight 11/07/2016 10/18/2016 25/49/8264  Systolic BP 158 309 407  Diastolic BP 680 881 89  Wt. (Lbs) - - -  BMI - - -      Physical Exam  Constitutional: He is oriented to person, place, and time. He appears well-developed and well-nourished.  Cardiovascular: Normal rate, normal heart sounds and intact distal pulses.   No murmur heard. Pulmonary/Chest: Effort normal and breath sounds normal. He has no wheezes. He has no rales. He exhibits no tenderness.  Abdominal: Soft. Bowel sounds are normal. He exhibits no distension and no mass. There is no tenderness.  Genitourinary:  Genitourinary Comments: 0.5 x0.5cm scrotal abscess with no drainage with granulation tissue on base  Musculoskeletal: He exhibits tenderness (tenderness on flexion, extension; unstable).  Neurological: He is alert and oriented to person, place, and time.  Skin: Skin is warm and dry.     Lab Results  Component Value Date   HGBA1C 7.7 (H) 10/10/2016    Assessment & Plan:   1. Type 2 diabetes mellitus with other specified complication, without long-term current use of insulin (HCC) Not fully optimized with A1c of 7.7 Continue diabetic diet and medications - Glucose (CBG)  2. Essential hypertension Uncontrolled Increase dose of  lisinopril We'll check her potassium level at next visit - lisinopril (PRINIVIL,ZESTRIL) 20 MG tablet; Take 1 tablet (20 mg total) by mouth daily.  Dispense: 30 tablet; Refill: 3  3. Scrotal abscess Healing Continue daily dressing changes at home  4. Bilateral knee osteoarthritis Intermittent pain High risk for fall.  Meds ordered this encounter  Medications  . lisinopril (PRINIVIL,ZESTRIL) 20 MG tablet    Sig: Take 1 tablet (20 mg total) by mouth daily.    Dispense:  30 tablet    Refill:  3    Discontinue previous dose    Follow-up: Return in about 1 month (around 12/08/2016) for follow up on Hypertension.   Arnoldo Morale MD

## 2016-11-22 MED FILL — FERROUS SULFATE 325 MG TAB: 325 (65 FE) | 30 days supply | Qty: 30 | Fill #1

## 2016-11-22 MED FILL — ?METFORMIN HCL 500MG TABLET: 500 | 30 days supply | Qty: 60 | Fill #1

## 2016-12-09 ENCOUNTER — Ambulatory Visit: Payer: Self-pay | Attending: Family Medicine | Admitting: Family Medicine

## 2016-12-09 ENCOUNTER — Encounter: Payer: Self-pay | Admitting: Family Medicine

## 2016-12-09 ENCOUNTER — Telehealth: Payer: Self-pay

## 2016-12-09 VITALS — BP 160/106 | HR 87 | Temp 98.4°F | Ht 65.0 in | Wt 245.0 lb

## 2016-12-09 DIAGNOSIS — E785 Hyperlipidemia, unspecified: Secondary | ICD-10-CM | POA: Insufficient documentation

## 2016-12-09 DIAGNOSIS — Z9119 Patient's noncompliance with other medical treatment and regimen: Secondary | ICD-10-CM | POA: Insufficient documentation

## 2016-12-09 DIAGNOSIS — Z79899 Other long term (current) drug therapy: Secondary | ICD-10-CM | POA: Insufficient documentation

## 2016-12-09 DIAGNOSIS — Z7982 Long term (current) use of aspirin: Secondary | ICD-10-CM | POA: Insufficient documentation

## 2016-12-09 DIAGNOSIS — IMO0001 Reserved for inherently not codable concepts without codable children: Secondary | ICD-10-CM

## 2016-12-09 DIAGNOSIS — Z794 Long term (current) use of insulin: Secondary | ICD-10-CM | POA: Insufficient documentation

## 2016-12-09 DIAGNOSIS — I10 Essential (primary) hypertension: Secondary | ICD-10-CM | POA: Insufficient documentation

## 2016-12-09 DIAGNOSIS — E119 Type 2 diabetes mellitus without complications: Secondary | ICD-10-CM | POA: Insufficient documentation

## 2016-12-09 LAB — GLUCOSE, POCT (MANUAL RESULT ENTRY): POC Glucose: 127 mg/dl — AB (ref 70–99)

## 2016-12-09 MED ORDER — METOPROLOL SUCCINATE ER 25 MG PO TB24: 25.0000 mg | ORAL_TABLET | Freq: Every day | ORAL | 5 refills | Status: DC

## 2016-12-09 MED ORDER — CLONIDINE HCL 0.1 MG PO TABS
0.2000 mg | ORAL_TABLET | Freq: Once | ORAL | Status: AC
  Administered 2016-12-09: 0.2 mg via ORAL

## 2016-12-09 MED ORDER — AMLODIPINE BESYLATE 10 MG PO TABS: 10.0000 mg | ORAL_TABLET | Freq: Every day | ORAL | 5 refills | Status: DC

## 2016-12-09 MED FILL — AMLODIPINE BESYLATE 10 MG T: 10 | 30 days supply | Qty: 30 | Fill #0

## 2016-12-09 MED FILL — ?LISINOPRIL 20 MG TABLET: 20 | 30 days supply | Qty: 30 | Fill #1

## 2016-12-09 MED FILL — METOPROLOL SUCC ER 25 MG TA: 25 | 30 days supply | Qty: 30 | Fill #0

## 2016-12-09 NOTE — Telephone Encounter (Signed)
Met with the patient when he was in the clinic today and assisted him with completing a SCAT application. The completed application was faxed to # [phone] and the patient was instructed to contact the SCAT office at # [phone] at the end of the week to inquire about scheduling an eligibility assessment.  Also provided him with an application for the [employer] and explained to him what information he needs to collect. Then explained that he needs to schedule an appointment to meet with the [employer] Financial Counselor to review the application. He stated that he understood.  He stated that he has a disability application pending. Encouraged him to follow up to check on the status of the disability application and to inquire at DSS if he is eligible for food stamps and/or medicaid.

## 2016-12-09 NOTE — Progress Notes (Signed)
Subjective:  Patient ID: [name], male    DOB: 27-May-1961  Age: 56 y.o. MRN: [id number]  CC: Diabetes and Hypertension   HPI [name] is a 56 year old male with a history of type 2 diabetes mellitus, hypertension, hyperlipidemia, gout, osteoarthritis of the knee who presents today for follow-up on his high blood pressure and blood pressure is severely elevated at 201/102.  He informs me he ran out of lisinopril and review of his med list indicates he has not been taking metoprolol either. He has not been compliant with a low-sodium diet. Denies headaches or chest pains.  For diabetes he remains on metformin. Denies any recent gout flares.  Past Medical History:  Diagnosis Date  . Gouty bursitis, elbow  10/18/2015  . Hx of gout   . Hyperlipidemia   . Hypertension   . Knee pain, chronic   . Type II diabetes mellitus ([place])     Past Surgical History:  Procedure Laterality Date  . KNEE ARTHROSCOPY Bilateral 12/29/2015   Procedure: ARTHROSCOPY KNEE BILATERAL WITH  PARTIAL MEDIAL AND LATERAL MENISCECTOMY AND FOUR COMPARTMENT SYNOVECTOMY , CHONDRALPLASTIES, PATELLA-FEMORAL CHONDRALPLASTIES BILATERALLY;  Surgeon: Dorna Leitz, MD;  Location: [facility];  Service: Orthopedics;  Laterality: Bilateral;  . NO PAST SURGERIES      No Known Allergies   Outpatient Medications Prior to Visit  Medication Sig Dispense Refill  . aspirin 81 MG EC tablet Take 1 tablet (81 mg total) by mouth daily. 90 tablet 1  . atorvastatin (LIPITOR) 40 MG tablet Take 1 tablet (40 mg total) by mouth daily at 6 PM. 30 tablet 1  . bisacodyl (DULCOLAX) 5 MG EC tablet Take 1 tablet (5 mg total) by mouth daily as needed for moderate constipation. 30 tablet 0  . Blood Glucose Monitoring Suppl (TRUE METRIX METER) w/Device KIT 1 Device by Does not apply route 3 (three) times daily. 1 kit 0  . ferrous sulfate 325 (65 FE) MG tablet Take 1 tablet (325 mg total) by mouth daily with breakfast. 30 tablet 3  .  glucose blood (TRUE METRIX BLOOD GLUCOSE TEST) test strip Use as instructed 100 each 12  . insulin aspart protamine- aspart (NOVOLOG MIX 70/30) (70-30) 100 UNIT/ML injection Inject 0.25 mLs (25 Units total) into the skin 2 (two) times daily with a meal. 10 mL 10  . Insulin Syringe-Needle U-100 (SAFETY INSULIN SYRINGES) 30G X 5/16" 0.5 ML MISC Use for insulin injection as directed 100 each 1  . lisinopril (PRINIVIL,ZESTRIL) 20 MG tablet Take 1 tablet (20 mg total) by mouth daily. 30 tablet 3  . metFORMIN (GLUCOPHAGE) 500 MG tablet Take 1 tablet (500 mg total) by mouth 2 (two) times daily with a meal. 60 tablet 2  . TRUEPLUS LANCETS 28G MISC 1 Device by Does not apply route 3 (three) times daily. 90 each 2  . metoprolol succinate (TOPROL-XL) 25 MG 24 hr tablet Take 1 tablet (25 mg total) by mouth daily. 30 tablet 0  . colchicine 0.6 MG tablet Take 0.6 mg by mouth daily.    Marland Kitchen HYDROcodone-acetaminophen (NORCO) 5-325 MG tablet Take 1 tablet by mouth every 8 (eight) hours as needed for moderate pain. (Patient not taking: Reported on 11/07/2016) 20 tablet 0   No facility-administered medications prior to visit.     ROS Review of Systems Constitutional: Negative for activity change and appetite change.  HENT: Negative for sinus pressure and sore throat.   Eyes: Negative for visual disturbance.  Respiratory: Negative for cough, chest  tightness and shortness of breath.   Cardiovascular: Negative for chest pain and leg swelling.  Gastrointestinal: Negative for abdominal distention, abdominal pain, constipation and diarrhea.  Endocrine: Negative.   Genitourinary: Negative for dysuria.  Musculoskeletal:       See hpi  Skin:  Negative for rash.  Allergic/Immunologic: Negative.   Neurological: Negative for weakness, light-headedness and numbness.  Psychiatric/Behavioral: Negative for dysphoric mood and suicidal ideas.   Objective:  BP (!) 201/102 (BP Location: Right Arm, Patient Position: Sitting,  Cuff Size: Large)   Pulse 87   Temp 98.4 F (36.9 C) (Oral)   Ht _0  (1.651 m)   Wt 245 lb (111.1 kg)   SpO2 98%   BMI 40.77 kg/m   BP/Weight 12/09/2016 11/07/2016 14/02/8184  Systolic BP 631 497 026  Diastolic BP 378 588 502  Wt. (Lbs) 245 - -  BMI 40.77 - -      Physical Exam Constitutional: He is oriented to person, place, and time. He appears well-developed and well-nourished.  Cardiovascular: Normal rate, normal heart sounds and intact distal pulses.   No murmur heard. Pulmonary/Chest: Effort normal and breath sounds normal. He has no wheezes. He has no rales. He exhibits no tenderness.  Abdominal: Soft. Bowel sounds are normal. He exhibits no distension and no mass. There is no tenderness.  Musculoskeletal: He exhibits tenderness (tenderness on flexion, extension; unstable).  Neurological: He is alert and oriented to person, place, and time.   Lab Results  Component Value Date   HGBA1C 7.7 (H) 10/10/2016    Assessment & Plan:   1. IDDM (insulin dependent diabetes mellitus) ([place]) Not fully optimized with A1c of 7.7 Continue current management - Glucose (CBG)  2. Essential hypertension UncontrolledDue to noncompliance Clonidine 0.2 mg administered Amlodipine added to regimen noncompliance with antihypertensives have been emphasized Low-sodium diet. Complete metabolic panel at next visit - cloNIDine (CATAPRES) tablet 0.2 mg; Take 2 tablets (0.2 mg total) by mouth once.   Meds ordered this encounter  Medications  . metoprolol succinate (TOPROL-XL) 25 MG 24 hr tablet    Sig: Take 1 tablet (25 mg total) by mouth daily.    Dispense:  30 tablet    Refill:  5  . amLODipine (NORVASC) 10 MG tablet    Sig: Take 1 tablet (10 mg total) by mouth daily.    Dispense:  30 tablet    Refill:  5  . cloNIDine (CATAPRES) tablet 0.2 mg    Follow-up: Return in about 3 weeks (around 12/30/2016) for Follow-up on hypertension.   Arnoldo Morale MD

## 2016-12-20 ENCOUNTER — Ambulatory Visit: Payer: Self-pay

## 2016-12-26 ENCOUNTER — Encounter: Payer: Self-pay | Admitting: Family Medicine

## 2016-12-26 ENCOUNTER — Ambulatory Visit: Payer: Self-pay | Attending: Family Medicine | Admitting: Family Medicine

## 2016-12-26 VITALS — BP 170/91 | HR 81 | Temp 98.6°F | Ht 65.0 in | Wt 267.6 lb

## 2016-12-26 DIAGNOSIS — M109 Gout, unspecified: Secondary | ICD-10-CM | POA: Insufficient documentation

## 2016-12-26 DIAGNOSIS — IMO0001 Reserved for inherently not codable concepts without codable children: Secondary | ICD-10-CM

## 2016-12-26 DIAGNOSIS — I10 Essential (primary) hypertension: Secondary | ICD-10-CM | POA: Insufficient documentation

## 2016-12-26 DIAGNOSIS — Z794 Long term (current) use of insulin: Secondary | ICD-10-CM

## 2016-12-26 DIAGNOSIS — G4709 Other insomnia: Secondary | ICD-10-CM

## 2016-12-26 DIAGNOSIS — G4700 Insomnia, unspecified: Secondary | ICD-10-CM | POA: Insufficient documentation

## 2016-12-26 DIAGNOSIS — Z7982 Long term (current) use of aspirin: Secondary | ICD-10-CM | POA: Insufficient documentation

## 2016-12-26 DIAGNOSIS — E785 Hyperlipidemia, unspecified: Secondary | ICD-10-CM | POA: Insufficient documentation

## 2016-12-26 DIAGNOSIS — E119 Type 2 diabetes mellitus without complications: Secondary | ICD-10-CM | POA: Insufficient documentation

## 2016-12-26 DIAGNOSIS — Z7984 Long term (current) use of oral hypoglycemic drugs: Secondary | ICD-10-CM | POA: Insufficient documentation

## 2016-12-26 LAB — GLUCOSE, POCT (MANUAL RESULT ENTRY): POC GLUCOSE: 171 mg/dL — AB (ref 70–99)

## 2016-12-26 MED ORDER — GLUCOSE BLOOD VI STRP: ORAL_STRIP | 12 refills | Status: DC

## 2016-12-26 MED ORDER — TRAZODONE HCL 50 MG PO TABS: 50.0000 mg | ORAL_TABLET | Freq: Every day | ORAL | 3 refills | Status: DC

## 2016-12-26 MED ORDER — TRUEPLUS LANCETS 28G MISC: 1.0000 | Freq: Three times a day (TID) | 12 refills | Status: DC

## 2016-12-26 MED FILL — FERROUS SULFATE 325 MG TAB: 325 (65 FE) | 30 days supply | Qty: 30 | Fill #2

## 2016-12-26 MED FILL — TRUE METRIX TEST STRIP: 33 days supply | Qty: 100 | Fill #0

## 2016-12-26 MED FILL — TRUEplus LANCETS 28G MISC: 90 days supply | Qty: 90 | Fill #0

## 2016-12-26 MED FILL — ATORVASTATIN 40 MG TABLET: 40 | 30 days supply | Qty: 30 | Fill #1

## 2016-12-26 MED FILL — ?METFORMIN HCL 500MG TABLET: 500 | 30 days supply | Qty: 60 | Fill #2

## 2016-12-26 MED FILL — traZODone HCL 50 MG TABS: 50 | 30 days supply | Qty: 30 | Fill #0

## 2016-12-26 NOTE — Progress Notes (Signed)
Subjective:  Patient ID: [name], male    DOB: [birth year]  Age: 56 y.o. MRN: [id number]  CC: Hypertension; Diabetes; and Insomnia   HPI [name] is a 56 year old male with a history of type 2 diabetes mellitus, hypertension, hyperlipidemia, gout, osteoarthritis of the knee who presents today for follow-up of Hypertension and his BP is elevated-he endorses not taking his medications this morning. "Thought he was not supposed to take any medications when going to the doctor's office".  He complains of insomnia and multiple nighttime awakenings. Denies caffeine use.  Past Medical History:  Diagnosis Date  . Gouty bursitis, elbow  10/18/2015  . Hx of gout   . Hyperlipidemia   . Hypertension   . Knee pain, chronic   . Type II diabetes mellitus ([place])     Past Surgical History:  Procedure Laterality Date  . KNEE ARTHROSCOPY Bilateral 12/29/2015   Procedure: ARTHROSCOPY KNEE BILATERAL WITH  PARTIAL MEDIAL AND LATERAL MENISCECTOMY AND FOUR COMPARTMENT SYNOVECTOMY , CHONDRALPLASTIES, PATELLA-FEMORAL CHONDRALPLASTIES BILATERALLY;  Surgeon: Dorna Leitz, MD;  Location: [facility];  Service: Orthopedics;  Laterality: Bilateral;  . NO PAST SURGERIES      No Known Allergies   Outpatient Medications Prior to Visit  Medication Sig Dispense Refill  . amLODipine (NORVASC) 10 MG tablet Take 1 tablet (10 mg total) by mouth daily. 30 tablet 5  . aspirin 81 MG EC tablet Take 1 tablet (81 mg total) by mouth daily. 90 tablet 1  . atorvastatin (LIPITOR) 40 MG tablet Take 1 tablet (40 mg total) by mouth daily at 6 PM. 30 tablet 1  . bisacodyl (DULCOLAX) 5 MG EC tablet Take 1 tablet (5 mg total) by mouth daily as needed for moderate constipation. 30 tablet 0  . Blood Glucose Monitoring Suppl (TRUE METRIX METER) w/Device KIT 1 Device by Does not apply route 3 (three) times daily. 1 kit 0  . ferrous sulfate 325 (65 FE) MG tablet Take 1 tablet (325 mg total) by mouth daily with breakfast. 30  tablet 3  . insulin aspart protamine- aspart (NOVOLOG MIX 70/30) (70-30) 100 UNIT/ML injection Inject 0.25 mLs (25 Units total) into the skin 2 (two) times daily with a meal. 10 mL 10  . Insulin Syringe-Needle U-100 (SAFETY INSULIN SYRINGES) 30G X 5/16" 0.5 ML MISC Use for insulin injection as directed 100 each 1  . lisinopril (PRINIVIL,ZESTRIL) 20 MG tablet Take 1 tablet (20 mg total) by mouth daily. 30 tablet 3  . metFORMIN (GLUCOPHAGE) 500 MG tablet Take 1 tablet (500 mg total) by mouth 2 (two) times daily with a meal. 60 tablet 2  . metoprolol succinate (TOPROL-XL) 25 MG 24 hr tablet Take 1 tablet (25 mg total) by mouth daily. 30 tablet 5  . glucose blood (TRUE METRIX BLOOD GLUCOSE TEST) test strip Use as instructed 100 each 12  . TRUEPLUS LANCETS 28G MISC 1 Device by Does not apply route 3 (three) times daily. 90 each 2  . colchicine 0.6 MG tablet Take 0.6 mg by mouth daily.    Marland Kitchen HYDROcodone-acetaminophen (NORCO) 5-325 MG tablet Take 1 tablet by mouth every 8 (eight) hours as needed for moderate pain. (Patient not taking: Reported on 11/07/2016) 20 tablet 0   No facility-administered medications prior to visit.     ROS Review of Systems  Constitutional: Negative for activity change and appetite change.  HENT: Negative for sinus pressure and sore throat.   Respiratory: Negative for chest tightness, shortness of breath and wheezing.  Cardiovascular: Negative for chest pain and palpitations.  Gastrointestinal: Negative for abdominal distention, abdominal pain and constipation.  Genitourinary: Negative.   Musculoskeletal: Negative.   Psychiatric/Behavioral: Positive for sleep disturbance. Negative for behavioral problems and dysphoric mood.    Objective:  BP (!) 170/91 (BP Location: Right Arm, Patient Position: Sitting, Cuff Size: Large)   Pulse 81   Temp 98.6 F (37 C) (Oral)   Ht _0  (1.651 m)   Wt 267 lb 9.6 oz (121.4 kg)   SpO2 99%   BMI 44.53 kg/m   BP/Weight 12/26/2016  12/09/2016 37/08/6268  Systolic BP 485 462 703  Diastolic BP 91 500 938  Wt. (Lbs) 267.6 245 -  BMI 44.53 40.77 -      Physical Exam Constitutional: He is oriented to person, place, and time. He appears well-developed and well-nourished.  Cardiovascular: Normal rate, normal heart sounds and intact distal pulses.   No murmur heard. Pulmonary/Chest: Effort normal and breath sounds normal. He has no wheezes. He has no rales. He exhibits no tenderness.  Abdominal: Soft. Bowel sounds are normal. He exhibits no distension and no mass. There is no tenderness.  Musculoskeletal: He exhibits tenderness (tenderness on flexion, extension; unstable).  Neurological: He is alert and oriented to person, place, and time.    Lab Results  Component Value Date   HGBA1C 7.7 (H) 10/10/2016    Assessment & Plan:   1.Type 2 diabetes mellitus Not fully optimized with A1c of 7.7 Continue medications and lifestyle modifications - Glucose (CBG) - glucose blood (TRUE METRIX BLOOD GLUCOSE TEST) test strip; Use as instructed 3 times daily  Dispense: 100 each; Refill: 12 - TRUEPLUS LANCETS 28G MISC; 1 Device by Does not apply route 3 (three) times daily.  Dispense: 90 each; Refill: 12  2. Other insomnia Discussed sleep hygiene - traZODone (DESYREL) 50 MG tablet; Take 1 tablet (50 mg total) by mouth at bedtime.  Dispense: 30 tablet; Refill: 3  3. Essential hypertension Uncontrolled as he is yet to take his antihypertensives Low-sodium diet Advised to take medications prior to coming in for appointment.   Meds ordered this encounter  Medications  . glucose blood (TRUE METRIX BLOOD GLUCOSE TEST) test strip    Sig: Use as instructed 3 times daily    Dispense:  100 each    Refill:  12  . TRUEPLUS LANCETS 28G MISC    Sig: 1 Device by Does not apply route 3 (three) times daily.    Dispense:  90 each    Refill:  12  . traZODone (DESYREL) 50 MG tablet    Sig: Take 1 tablet (50 mg total) by mouth at  bedtime.    Dispense:  30 tablet    Refill:  3    Follow-up: Return in about 1 month (around 01/23/2017) for Follow-up on hypertension.   Arnoldo Morale MD

## 2017-01-21 ENCOUNTER — Other Ambulatory Visit: Payer: Self-pay | Admitting: Physician Assistant

## 2017-01-21 ENCOUNTER — Ambulatory Visit: Payer: Self-pay | Attending: Family Medicine | Admitting: Family Medicine

## 2017-01-21 ENCOUNTER — Encounter: Payer: Self-pay | Admitting: Family Medicine

## 2017-01-21 ENCOUNTER — Encounter: Payer: Self-pay | Admitting: Licensed Clinical Social Worker

## 2017-01-21 VITALS — BP 198/100 | HR 88 | Temp 98.5°F | Ht 65.0 in | Wt 241.0 lb

## 2017-01-21 DIAGNOSIS — E119 Type 2 diabetes mellitus without complications: Secondary | ICD-10-CM | POA: Insufficient documentation

## 2017-01-21 DIAGNOSIS — Z79899 Other long term (current) drug therapy: Secondary | ICD-10-CM | POA: Insufficient documentation

## 2017-01-21 DIAGNOSIS — Z794 Long term (current) use of insulin: Secondary | ICD-10-CM | POA: Insufficient documentation

## 2017-01-21 DIAGNOSIS — M1A00X0 Idiopathic chronic gout, unspecified site, without tophus (tophi): Secondary | ICD-10-CM | POA: Insufficient documentation

## 2017-01-21 DIAGNOSIS — Z9119 Patient's noncompliance with other medical treatment and regimen: Secondary | ICD-10-CM | POA: Insufficient documentation

## 2017-01-21 DIAGNOSIS — IMO0001 Reserved for inherently not codable concepts without codable children: Secondary | ICD-10-CM

## 2017-01-21 DIAGNOSIS — E785 Hyperlipidemia, unspecified: Secondary | ICD-10-CM | POA: Insufficient documentation

## 2017-01-21 DIAGNOSIS — I10 Essential (primary) hypertension: Secondary | ICD-10-CM

## 2017-01-21 DIAGNOSIS — Z7982 Long term (current) use of aspirin: Secondary | ICD-10-CM | POA: Insufficient documentation

## 2017-01-21 DIAGNOSIS — L603 Nail dystrophy: Secondary | ICD-10-CM | POA: Insufficient documentation

## 2017-01-21 DIAGNOSIS — M170 Bilateral primary osteoarthritis of knee: Secondary | ICD-10-CM | POA: Insufficient documentation

## 2017-01-21 LAB — GLUCOSE, POCT (MANUAL RESULT ENTRY): POC GLUCOSE: 134 mg/dL — AB (ref 70–99)

## 2017-01-21 LAB — POCT GLYCOSYLATED HEMOGLOBIN (HGB A1C): HEMOGLOBIN A1C: 7

## 2017-01-21 MED ORDER — CLONIDINE HCL 0.1 MG PO TABS
0.2000 mg | ORAL_TABLET | Freq: Once | ORAL | Status: AC
  Administered 2017-01-21: 0.2 mg via ORAL

## 2017-01-21 MED ORDER — METFORMIN HCL 500 MG PO TABS: 500.0000 mg | ORAL_TABLET | Freq: Two times a day (BID) | ORAL | 3 refills | Status: DC

## 2017-01-21 MED ORDER — ATORVASTATIN CALCIUM 40 MG PO TABS: 40.0000 mg | ORAL_TABLET | Freq: Every day | ORAL | 3 refills | Status: DC

## 2017-01-21 MED FILL — traZODone HCL 50 MG TABS: 50 | 30 days supply | Qty: 30 | Fill #1

## 2017-01-21 MED FILL — METOPROLOL SUCC ER 25 MG TA: 25 | 30 days supply | Qty: 30 | Fill #1

## 2017-01-21 MED FILL — metFORMIN HCL 500 MG TABS: 500 | 30 days supply | Qty: 60 | Fill #0

## 2017-01-21 MED FILL — ?LISINOPRIL 20 MG TABLET: 20 | 30 days supply | Qty: 30 | Fill #2

## 2017-01-21 MED FILL — FERROUS SULFATE 325 MG TAB: 325 (65 FE) | 30 days supply | Qty: 30 | Fill #3

## 2017-01-21 MED FILL — AMLODIPINE BESYLATE 10 MG T: 10 | 30 days supply | Qty: 30 | Fill #1

## 2017-01-21 NOTE — Progress Notes (Signed)
Subjective:    Patient ID: [name], male    DOB: 10-14-1961, 56 y.o.   MRN: [id number]  HPI [name] is a 56 year old African American male with a history of type 2 diabetes mellitus, hypertension, hyperlipidemia, gout, osteoarthritis of the knee who presents today for follow-up on his high blood pressure.  Today his blood pressure is elevated (198/100), he informs me today that he ran out of all his blood pressure medications four days ago. He expressed frustration about not not being able to afford his medications or get around due to his unemployment issues and problem getting disability. He feels depressed but denies suicide or homicidal ideation. He has not been compliant with a low-sodium diet or exercises. He denies headaches or chest pains.  For diabetes he remains on metformin and insulin, he came in today with a log of his fasting blood sugars and random blood sugar. His log shows a blood sugar in the range of 100-120s. He denies diarrhea, denies tingling or numbness in his extremities. He has not had an eye exam or a podiatric foot exam. Denies any recent gout flares. He takes a statin, denies arthralgia. He presents today with a complaints of continual pain in his bilateral knee. He denies taking any thing for pain. He endorsed not being able to stand for long periods of times due to pain, he uses walker for ambulation around his house. Today he is on a wheel chair. He denies swelling, or redness of his knees, he denies fever or chills.  Past Medical History:  Diagnosis Date  . Gouty bursitis, elbow  10/18/2015  . Hx of gout   . Hyperlipidemia   . Hypertension   . Knee pain, chronic   . Type II diabetes mellitus ([place])    Past Surgical History:  Procedure Laterality Date  . KNEE ARTHROSCOPY Bilateral 12/29/2015   Procedure: ARTHROSCOPY KNEE BILATERAL WITH  PARTIAL MEDIAL AND LATERAL MENISCECTOMY AND FOUR COMPARTMENT SYNOVECTOMY , CHONDRALPLASTIES, PATELLA-FEMORAL  CHONDRALPLASTIES BILATERALLY;  Surgeon: Dorna Leitz, MD;  Location: [facility];  Service: Orthopedics;  Laterality: Bilateral;  . NO PAST SURGERIES     Current Outpatient Prescriptions on File Prior to Visit  Medication Sig Dispense Refill  . amLODipine (NORVASC) 10 MG tablet Take 1 tablet (10 mg total) by mouth daily. 30 tablet 5  . aspirin 81 MG EC tablet Take 1 tablet (81 mg total) by mouth daily. 90 tablet 1  . atorvastatin (LIPITOR) 40 MG tablet Take 1 tablet (40 mg total) by mouth daily at 6 PM. 30 tablet 1  . Blood Glucose Monitoring Suppl (TRUE METRIX METER) w/Device KIT 1 Device by Does not apply route 3 (three) times daily. 1 kit 0  . ferrous sulfate 325 (65 FE) MG tablet Take 1 tablet (325 mg total) by mouth daily with breakfast. 30 tablet 3  . glucose blood (TRUE METRIX BLOOD GLUCOSE TEST) test strip Use as instructed 3 times daily 100 each 12  . HYDROcodone-acetaminophen (NORCO) 5-325 MG tablet Take 1 tablet by mouth every 8 (eight) hours as needed for moderate pain. 20 tablet 0  . insulin aspart protamine- aspart (NOVOLOG MIX 70/30) (70-30) 100 UNIT/ML injection Inject 0.25 mLs (25 Units total) into the skin 2 (two) times daily with a meal. 10 mL 10  . Insulin Syringe-Needle U-100 (SAFETY INSULIN SYRINGES) 30G X 5/16" 0.5 ML MISC Use for insulin injection as directed 100 each 1  . lisinopril (PRINIVIL,ZESTRIL) 20 MG tablet Take 1 tablet (20  mg total) by mouth daily. 30 tablet 3  . metFORMIN (GLUCOPHAGE) 500 MG tablet Take 1 tablet (500 mg total) by mouth 2 (two) times daily with a meal. 60 tablet 2  . metoprolol succinate (TOPROL-XL) 25 MG 24 hr tablet Take 1 tablet (25 mg total) by mouth daily. 30 tablet 5  . traZODone (DESYREL) 50 MG tablet Take 1 tablet (50 mg total) by mouth at bedtime. 30 tablet 3  . TRUEPLUS LANCETS 28G MISC 1 Device by Does not apply route 3 (three) times daily. 90 each 12  . bisacodyl (DULCOLAX) 5 MG EC tablet Take 1 tablet (5 mg total) by mouth daily as needed  for moderate constipation. (Patient not taking: Reported on 01/21/2017) 30 tablet 0  . colchicine 0.6 MG tablet Take 0.6 mg by mouth daily.     No current facility-administered medications on file prior to visit.    No Known Allergies   Review of Systems  Constitutional: Negative for appetite change, chills, fatigue, fever and unexpected weight change.  HENT: Negative.   Eyes: Negative for photophobia and visual disturbance.  Respiratory: Negative for cough, chest tightness and shortness of breath.   Cardiovascular: Negative for chest pain, palpitations and leg swelling.  Gastrointestinal: Negative for abdominal pain, blood in stool, constipation, diarrhea and vomiting.  Endocrine: Negative for polydipsia, polyphagia and polyuria.  Genitourinary: Negative for decreased urine volume and dysuria.  Musculoskeletal: Positive for gait problem (from knee pain). Negative for joint swelling.  Skin: Positive for color change (dryness on bilateral lower legs). Negative for wound.  Allergic/Immunologic: Negative.   Neurological: Positive for headaches. Negative for dizziness, weakness, light-headedness and numbness.  Psychiatric/Behavioral: Negative for suicidal ideas.       Objective: BP (!) 198/100 (BP Location: Left Arm, Cuff Size: Large)   Pulse 88   Temp 98.5 F (36.9 C) (Oral)   Ht _0  (1.651 m)   Wt 241 lb (109.3 kg)   SpO2 98%   BMI 40.10 kg/m    HgbeA1C 7% down from 7.7% at last visit.   Physical Exam  Constitutional: He is oriented to person, place, and time. He appears well-nourished. He appears distressed.  Obese, unkempt  HENT:  Head: Normocephalic.  Mouth/Throat: Oropharynx is clear and moist.  Eyes: Conjunctivae and EOM are normal. Pupils are equal, round, and reactive to light.  Neck: Normal range of motion. No thyromegaly present.  Cardiovascular: Normal rate, regular rhythm, normal heart sounds and intact distal pulses.   No murmur heard. Pulmonary/Chest: Effort  normal and breath sounds normal. No respiratory distress.  Abdominal: Soft. Bowel sounds are normal.  Musculoskeletal: He exhibits tenderness (bilateral knees and left foot).  Lymphadenopathy:    He has no cervical adenopathy.  Neurological: He is alert and oriented to person, place, and time.  Skin: Skin is warm and dry.  Dry scaly raised patches on bilateral lower legs and foot. Thick, untrimmed toes nails, dry thick calluses on bilateral heels and soles. No ulcers or wound, no erythema or swelling noted  Psychiatric: Thought content normal.  Te[place],       Assessment & Plan:  1. IDDM (insulin dependent diabetes mellitus) ([place])  controlled, HgbA1C 7% down from 7.7%.   Continue current medication regimen, low carb diet encouraged    Patient asked to make appointment with Opthalmologist or Optometrist for eye exam. Aim for 30 minutes of exercise most days. Rethink what you drink. Water is great! Aim for 2-3 Carb Choices per meal (30-45 grams) +/- 1  either way  Aim for 0-15 Carbs per snack if hungry  Include protein in moderation with your meals and snacks  Consider reading food labels for Total Carbohydrate and Fat Grams of foods  Consider checking BG at alternate times per day  Continue taking medication as directed Be mindful about how much sugar you are adding to beverages and other foods. Fruit Punch - find one with no sugar  Measure and decrease portions of carbohydrate foods  Make your plate and don't go back for seconds  - Glucose (CBG) - HgB A1c - Ambulatory referral to Podiatry  2. Essential hypertension Not controlled, patient runs out of medications and has problem refilling them. Will give Clonidine per clinic protocol. He was advised to pick up meds today at the clinic's pharmacy. Will see him back in 2 weeks to evaluate his blood pressure Patient to see a personal from LCSW to discussed options for social issues. Low salt diet discussed and encouraged, do not add  extra salt to meals. - Glucose (CBG) - HgB A1c - cloNIDine (CATAPRES) tablet 0.2 mg; Take 2 tablets (0.2 mg total) by mouth once.  3. Primary osteoarthritis of both knees     This is a chronic condition, continue with supportive therapy. Ice and heat pack as needed. Take over the counter Acetaminophen as needed for pain.  4. Idiopathic chronic gout without tophus, unspecified site Symptom currently controlled.  5. Dystrophic nail     Proper foot hygiene discussed and encouraged. - Ambulatory referral to [facility] maintenance reviewed Diet and exercise encouraged Continue all meds Follow up  In 2 weeks   Jari Favre, RN, BSN, AGNP-Student

## 2017-01-21 NOTE — BH Specialist Note (Signed)
Session Start time: 10:20 AM   End Time: 10:50 AM Total Time:  30 minutes Type of Service: [place] Interpreter: No.   Interpreter Name & Language: N/A # [facility] Visits July 2017-June 2018: 1st   SUBJECTIVE: [name] is a 56 y.o. male  Pt. was referred by Dr. Jarold [name] for:  depression. Pt. reports the following symptoms/concerns: overwhelming feelings of sadness, difficulty sleeping, low energy/motivation, and frustration regarding inability to provide for the household Duration of problem:  Ongoing Severity: moderate Previous treatment: None reported   OBJECTIVE: Mood: Depressed & Affect: Appropriate Risk of harm to self or others: Pt denied SI/HI/AVH Assessments administered: PHQ-9; GAD-7  LIFE CONTEXT:  Family & Social: Pt resides with his girlfriend, [name]. School/ Work: Pt is unemployed. He is uninsured and has applied for disability.  Self-Care: No report of substance use Life changes: Pt is experiencing financial strain resulting in difficulty obtaining medication and increased feelings of sadness What is important to pt/family (values): Family, Good Health, and Independence   GOALS ADDRESSED:  Decrease symptoms of depression Increase knowledge of coping skills  INTERVENTIONS: Solution Focused, Strength-based and Supportive   ASSESSMENT:  Pt currently experiencing depression triggered by ongoing medical concerns and financial strain. Pt reports overwhelming feelings of sadness, difficulty sleeping, low energy/motivation, and frustration regarding inability to provide for the household. He receives support from significant other. Pt may benefit from psychotherapy and medication management. LCSWA educated pt on the cycle of depression and discussed healthy coping skills to decrease symptoms. Pt verbalized understanding of how mental and physical health correlates to one another, in addition, to the importance of medication compliance to  manage diabetes and hypertension. LCSWA strongly encouraged pt to schedule appointment with Financial Counseling to assist with financial strain and transportation. Pt was provided buss passes to assist in returning home, in addition, to resources for crisis intervention, therapy, and medication management.     PLAN: 1. F/U with behavioral health clinician: Pt was encouraged to contact [facility] if symptoms worsen or fail to improve to schedule behavioral appointments at [facility]. 2. Behavioral Health meds: Desyrel 3. Behavioral recommendations: LCSWA recommends that pt apply healthy coping skills discussed and schedule appointment with financial counseling. Pt is encouraged to schedule follow up appointment with LCSWA 4. Referral: Brief Counseling/Psychotherapy, Screening Tool(s)  Administered, [employer], Problem-solving teaching/coping strategies, Psychoeducation and Supportive Counseling 5. From scale of 1-10, how likely are you to follow plan: 7/10   Rebekah Chesterfield, MSW, [place] Worker 01/23/17 11:12 AM  Warmhandoff:   Warm Hand Off Completed.

## 2017-01-21 NOTE — Progress Notes (Signed)
Need refills- "all meds", has been out of BP meds  Needs to see [facility]

## 2017-02-04 ENCOUNTER — Ambulatory Visit: Payer: Self-pay | Admitting: Family Medicine

## 2017-02-25 ENCOUNTER — Other Ambulatory Visit: Payer: Self-pay | Admitting: Physician Assistant

## 2017-02-25 DIAGNOSIS — D509 Iron deficiency anemia, unspecified: Secondary | ICD-10-CM

## 2017-02-25 MED FILL — !NOVOLOG MIX 70/30 VIAL: 70-30/ML | 20 days supply | Qty: 10 | Fill #1

## 2017-02-25 MED FILL — LISINOPRIL 20 MG TABLET: 20 | 30 days supply | Qty: 30 | Fill #3

## 2017-02-25 MED FILL — AMLODIPINE BESYLATE 10 MG T: 10 | 30 days supply | Qty: 30 | Fill #2

## 2017-02-25 MED FILL — TRUEPLUS SYR 0.5ML 30GX5/16: 30G X 5/16" | 30 days supply | Qty: 100 | Fill #1

## 2017-02-25 MED FILL — metFORMIN HCL 500 MG TABS: 500 | 30 days supply | Qty: 60 | Fill #1

## 2017-02-25 MED FILL — FERROUS SULFATE 325 MG TAB: 325 (65 FE) | 30 days supply | Qty: 30 | Fill #0

## 2017-02-25 MED FILL — METOPROLOL SUCC ER 25 MG TA: 25 | 30 days supply | Qty: 30 | Fill #2

## 2017-02-25 MED FILL — ?ATORVASTATIN 40MG TABLET: 40 | 30 days supply | Qty: 30 | Fill #0

## 2017-03-18 ENCOUNTER — Ambulatory Visit: Payer: Self-pay | Admitting: Family Medicine

## 2017-03-20 ENCOUNTER — Ambulatory Visit: Payer: Self-pay | Attending: Family Medicine | Admitting: Family Medicine

## 2017-03-20 ENCOUNTER — Encounter: Payer: Self-pay | Admitting: Family Medicine

## 2017-03-20 VITALS — BP 150/89 | HR 82 | Temp 97.6°F | Resp 18 | Ht 65.0 in | Wt 249.0 lb

## 2017-03-20 DIAGNOSIS — IMO0001 Reserved for inherently not codable concepts without codable children: Secondary | ICD-10-CM

## 2017-03-20 DIAGNOSIS — E119 Type 2 diabetes mellitus without complications: Secondary | ICD-10-CM | POA: Insufficient documentation

## 2017-03-20 DIAGNOSIS — Z794 Long term (current) use of insulin: Secondary | ICD-10-CM | POA: Insufficient documentation

## 2017-03-20 DIAGNOSIS — Z7982 Long term (current) use of aspirin: Secondary | ICD-10-CM | POA: Insufficient documentation

## 2017-03-20 DIAGNOSIS — E785 Hyperlipidemia, unspecified: Secondary | ICD-10-CM | POA: Insufficient documentation

## 2017-03-20 DIAGNOSIS — M170 Bilateral primary osteoarthritis of knee: Secondary | ICD-10-CM | POA: Insufficient documentation

## 2017-03-20 DIAGNOSIS — M109 Gout, unspecified: Secondary | ICD-10-CM | POA: Insufficient documentation

## 2017-03-20 DIAGNOSIS — I10 Essential (primary) hypertension: Secondary | ICD-10-CM | POA: Insufficient documentation

## 2017-03-20 LAB — GLUCOSE, POCT (MANUAL RESULT ENTRY): POC GLUCOSE: 163 mg/dL — AB (ref 70–99)

## 2017-03-20 MED ORDER — LISINOPRIL-HYDROCHLOROTHIAZIDE 20-25 MG PO TABS: 1.0000 | ORAL_TABLET | Freq: Every day | ORAL | 3 refills | Status: DC

## 2017-03-20 NOTE — Progress Notes (Signed)
F/U HTN Taking medication daily  No tobacco user  No suicidal thoughts in the past two weeks  C/C Shortness on Knee, Bilateral Hx Surgery 2017 Pain scale #6

## 2017-03-20 NOTE — Patient Instructions (Signed)

## 2017-03-20 NOTE — Progress Notes (Signed)
Subjective:  Patient ID: [name], male    DOB: [birth year]  Age: 56 y.o. MRN: [id number]  CC: Hypertension   HPI [name]  is a 56 year old male with a history of type 2 diabetes mellitus(A1c 7.0), hypertension, hyperlipidemia, gout, osteoarthritis of the knee who presents today for follow-up of Hypertension.  He endorses compliance with his medications and his blood pressure is improved compared to his last visit but is still elevated.  He does have improvement in his bilateral knee pain as he has been undergoing physical therapy and has improved from using a wheelchair to now ambulating with a walker. He takes Advil as needed for knee pain with control of symptoms.   Past Medical History:  Diagnosis Date  . Gouty bursitis, elbow  10/18/2015  . Hx of gout   . Hyperlipidemia   . Hypertension   . Knee pain, chronic   . Type II diabetes mellitus ([place])     Past Surgical History:  Procedure Laterality Date  . KNEE ARTHROSCOPY Bilateral 12/29/2015   Procedure: ARTHROSCOPY KNEE BILATERAL WITH  PARTIAL MEDIAL AND LATERAL MENISCECTOMY AND FOUR COMPARTMENT SYNOVECTOMY , CHONDRALPLASTIES, PATELLA-FEMORAL CHONDRALPLASTIES BILATERALLY;  Surgeon: Dorna Leitz, MD;  Location: [place];  Service: Orthopedics;  Laterality: Bilateral;  . NO PAST SURGERIES      No Known Allergies   Outpatient Medications Prior to Visit  Medication Sig Dispense Refill  . amLODipine (NORVASC) 10 MG tablet Take 1 tablet (10 mg total) by mouth daily. 30 tablet 5  . aspirin 81 MG EC tablet Take 1 tablet (81 mg total) by mouth daily. 90 tablet 1  . atorvastatin (LIPITOR) 40 MG tablet Take 1 tablet (40 mg total) by mouth daily at 6 PM. 30 tablet 3  . Blood Glucose Monitoring Suppl (TRUE METRIX METER) w/Device KIT 1 Device by Does not apply route 3 (three) times daily. 1 kit 0  . ferrous sulfate 325 (65 FE) MG tablet TAKE 1 TABLET BY MOUTH DAILY WITH BREAKFAST. 30 tablet 3  . glucose blood (TRUE METRIX  BLOOD GLUCOSE TEST) test strip Use as instructed 3 times daily 100 each 12  . insulin aspart protamine- aspart (NOVOLOG MIX 70/30) (70-30) 100 UNIT/ML injection Inject 0.25 mLs (25 Units total) into the skin 2 (two) times daily with a meal. 10 mL 10  . Insulin Syringe-Needle U-100 (SAFETY INSULIN SYRINGES) 30G X 5/16" 0.5 ML MISC Use for insulin injection as directed 100 each 1  . metFORMIN (GLUCOPHAGE) 500 MG tablet Take 1 tablet (500 mg total) by mouth 2 (two) times daily with a meal. 60 tablet 3  . metoprolol succinate (TOPROL-XL) 25 MG 24 hr tablet Take 1 tablet (25 mg total) by mouth daily. 30 tablet 5  . traZODone (DESYREL) 50 MG tablet Take 1 tablet (50 mg total) by mouth at bedtime. 30 tablet 3  . TRUEPLUS LANCETS 28G MISC 1 Device by Does not apply route 3 (three) times daily. 90 each 12  . lisinopril (PRINIVIL,ZESTRIL) 20 MG tablet Take 1 tablet (20 mg total) by mouth daily. 30 tablet 3  . bisacodyl (DULCOLAX) 5 MG EC tablet Take 1 tablet (5 mg total) by mouth daily as needed for moderate constipation. (Patient not taking: Reported on 01/21/2017) 30 tablet 0  . colchicine 0.6 MG tablet Take 0.6 mg by mouth daily.    Marland Kitchen HYDROcodone-acetaminophen (NORCO) 5-325 MG tablet Take 1 tablet by mouth every 8 (eight) hours as needed for moderate pain. (Patient not taking: Reported on  03/20/2017) 20 tablet 0   No facility-administered medications prior to visit.     ROS Review of Systems Constitutional: Negative for activity change and appetite change.  HENT: Negative for sinus pressure and sore throat.   Respiratory: Negative for chest tightness, shortness of breath and wheezing.   Cardiovascular: Negative for chest pain and palpitations, Positive for pedal edema.  Gastrointestinal: Negative for abdominal distention, abdominal pain and constipation.  Genitourinary: Negative.   Musculoskeletal: Positive for knee pain  Skin: No rash Neurological: No seizures, no headache Psychiatric/Behavioral:  Negative for behavioral problems and dysphoric mood.   Objective:  BP (!) 150/89 (BP Location: Left Arm, Patient Position: Sitting, Cuff Size: Large)   Pulse 82   Temp 97.6 F (36.4 C) (Oral)   Resp 18   Ht '5\' 5"'  (1.651 m)   Wt 249 lb (112.9 kg)   SpO2 98%   BMI 41.44 kg/m   BP/Weight 03/20/2017 03/25/4834 0/05/5731  Systolic BP 256 720 919  Diastolic BP 89 802 91  Wt. (Lbs) 249 241 267.6  BMI 41.44 40.1 44.53      Physical Exam Constitutional: He is oriented to person, place, and time. He appears well-developed and well-nourished.  Cardiovascular: Normal rate, normal heart sounds and intact distal pulses. Bilateral 1+ nonpitting pedal edema   No murmur heard. Pulmonary/Chest: Effort normal and breath sounds normal. He has no wheezes. He has no rales. He exhibits no tenderness.  Abdominal: Soft. Bowel sounds are normal. He exhibits no distension and no mass. There is no tenderness.  Musculoskeletal: He exhibits tenderness (tenderness on flexion, extension; unstable).  Neurological: He is alert and oriented to person, place, and time.  Skin: Xerosis with associated flaking and scabs in  lower extremities  Lab Results  Component Value Date   HGBA1C 7.0 01/21/2017    CMP Latest Ref Rng & Units 10/18/2016 10/12/2016 10/11/2016  Glucose 65 - 99 mg/dL 79 92 101(H)  BUN 7 - 25 mg/dL '9 15 11  ' Creatinine 0.70 - 1.33 mg/dL 0.95 0.99 0.84  Sodium 135 - 146 mmol/L 141 138 137  Potassium 3.5 - 5.3 mmol/L 3.5 3.9 3.6  Chloride 98 - 110 mmol/L 105 103 103  CO2 20 - 31 mmol/L '26 26 23  ' Calcium 8.6 - 10.3 mg/dL 9.6 9.4 9.6  Total Protein 6.1 - 8.1 g/dL 8.2(H) - -  Total Bilirubin 0.2 - 1.2 mg/dL 0.6 - -  Alkaline Phos 40 - 115 U/L 77 - -  AST 10 - 35 U/L 14 - -  ALT 9 - 46 U/L 11 - -    Lipid Panel     Component Value Date/Time   CHOL 158 10/10/2016 1125   TRIG 117 10/10/2016 1125   HDL 35 (L) 10/10/2016 1125   CHOLHDL 4.5 10/10/2016 1125   VLDL 23 10/10/2016 1125   LDLCALC 100  (H) 10/10/2016 1125    Lab Results  Component Value Date   HGBA1C 7.0 01/21/2017    Assessment & Plan:   1. IDDM (insulin dependent diabetes mellitus) ([place]) Controlled with A1c of 7.0 from 01/2017 Continue current medications Diabetic diet  2. Essential hypertension Uncontrolled Switched from lisinopril to lisinopril/HCTZ HCTZ will help with pedal edema CMET at next visit Low-sodium diet - lisinopril/Hydrochlorothiazide (PRINZIDE,ZESTORETIC) 20-25 MG tablet; Take 1 tablet by mouth daily.  Dispense: 30 tablet; Refill: 3  3. Primary osteoarthritis of both knees Status post cortisone injections in the past Controlled on OTC Advil Continue physical therapy   Meds ordered this  encounter  Medications  . lisinopril-hydrochlorothiazide (PRINZIDE,ZESTORETIC) 20-25 MG tablet    Sig: Take 1 tablet by mouth daily.    Dispense:  30 tablet    Refill:  3    Discontinue Lisinopril    Follow-up: Return in about 1 month (around 04/20/2017) for Follow-up of hypertension.   Arnoldo Morale MD

## 2017-03-21 ENCOUNTER — Telehealth: Payer: Self-pay | Admitting: Family Medicine

## 2017-03-21 NOTE — Telephone Encounter (Signed)
Called pt, pt dropped off CAFA/OC application but needs to schedule an appointment to start process. NO VM

## 2017-03-27 ENCOUNTER — Ambulatory Visit: Payer: Self-pay | Admitting: Family Medicine

## 2017-03-31 ENCOUNTER — Other Ambulatory Visit: Payer: Self-pay | Admitting: Physician Assistant

## 2017-03-31 DIAGNOSIS — Z794 Long term (current) use of insulin: Principal | ICD-10-CM

## 2017-03-31 DIAGNOSIS — E119 Type 2 diabetes mellitus without complications: Principal | ICD-10-CM

## 2017-03-31 DIAGNOSIS — IMO0001 Reserved for inherently not codable concepts without codable children: Secondary | ICD-10-CM

## 2017-03-31 MED FILL — !NOVOLOG MIX 70/30 VIAL: 70-30/ML | 20 days supply | Qty: 10 | Fill #2

## 2017-03-31 MED FILL — ATORVASTATIN 40 MG TABLET: 40 | 30 days supply | Qty: 30 | Fill #1

## 2017-03-31 MED FILL — METOPROLOL SUCC ER 25 MG TA: 25 | 30 days supply | Qty: 30 | Fill #3

## 2017-03-31 MED FILL — LISINOPRIL-HCTZ 20-25 MG TA: 20-25 | 30 days supply | Qty: 30 | Fill #0

## 2017-03-31 MED FILL — TRUE METRIX TEST STRIP: 33 days supply | Qty: 100 | Fill #1

## 2017-03-31 MED FILL — ?METFORMIN HCL 500MG TABLET: 500 | 30 days supply | Qty: 60 | Fill #2

## 2017-03-31 MED FILL — TRUEplus LANCETS 28G MISC: 90 days supply | Qty: 90 | Fill #1

## 2017-03-31 MED FILL — AMLODIPINE BESYLATE 10 MG T: 10 | 30 days supply | Qty: 30 | Fill #3

## 2017-03-31 MED FILL — FERROUS SULFATE 325 MG TAB: 325 (65 FE) | 30 days supply | Qty: 30 | Fill #1

## 2017-04-01 MED FILL — TRUEPLUS SYR 0.5ML 30GX5/16: 30G X 5/16" | 25 days supply | Qty: 100 | Fill #0

## 2017-04-29 ENCOUNTER — Other Ambulatory Visit: Payer: Self-pay | Admitting: Family Medicine

## 2017-04-29 ENCOUNTER — Ambulatory Visit: Payer: Self-pay | Attending: Family Medicine

## 2017-04-29 DIAGNOSIS — Z794 Long term (current) use of insulin: Principal | ICD-10-CM

## 2017-04-29 DIAGNOSIS — E119 Type 2 diabetes mellitus without complications: Principal | ICD-10-CM

## 2017-04-29 DIAGNOSIS — IMO0001 Reserved for inherently not codable concepts without codable children: Secondary | ICD-10-CM

## 2017-04-29 MED FILL — LISINOPRIL-HCTZ 20-25 MG TA: 20-25 | 30 days supply | Qty: 30 | Fill #1

## 2017-04-29 MED FILL — ?METFORMIN HCL 500MG TABLET: 500 | 30 days supply | Qty: 60 | Fill #3

## 2017-04-29 MED FILL — FERROUS SULFATE 325 MG TAB: 325 (65 FE) | 30 days supply | Qty: 30 | Fill #2

## 2017-04-29 MED FILL — METOPROLOL SUCC ER 25 MG TA: 25 | 30 days supply | Qty: 30 | Fill #4

## 2017-04-29 MED FILL — AMLODIPINE BESYLATE 10 MG T: 10 | 30 days supply | Qty: 30 | Fill #4

## 2017-05-05 MED FILL — ATORVASTATIN 40 MG TABLET: 40 | 30 days supply | Qty: 30 | Fill #2

## 2017-05-07 ENCOUNTER — Ambulatory Visit: Payer: Self-pay | Attending: Family Medicine | Admitting: Family Medicine

## 2017-05-07 ENCOUNTER — Encounter: Payer: Self-pay | Admitting: Family Medicine

## 2017-05-07 VITALS — BP 135/83 | HR 96 | Temp 98.0°F | Wt 247.4 lb

## 2017-05-07 DIAGNOSIS — R05 Cough: Secondary | ICD-10-CM | POA: Insufficient documentation

## 2017-05-07 DIAGNOSIS — I10 Essential (primary) hypertension: Secondary | ICD-10-CM | POA: Insufficient documentation

## 2017-05-07 DIAGNOSIS — Z79899 Other long term (current) drug therapy: Secondary | ICD-10-CM | POA: Insufficient documentation

## 2017-05-07 DIAGNOSIS — M109 Gout, unspecified: Secondary | ICD-10-CM | POA: Insufficient documentation

## 2017-05-07 DIAGNOSIS — Z7982 Long term (current) use of aspirin: Secondary | ICD-10-CM | POA: Insufficient documentation

## 2017-05-07 DIAGNOSIS — IMO0001 Reserved for inherently not codable concepts without codable children: Secondary | ICD-10-CM

## 2017-05-07 DIAGNOSIS — E119 Type 2 diabetes mellitus without complications: Secondary | ICD-10-CM | POA: Insufficient documentation

## 2017-05-07 DIAGNOSIS — R059 Cough, unspecified: Secondary | ICD-10-CM

## 2017-05-07 DIAGNOSIS — E785 Hyperlipidemia, unspecified: Secondary | ICD-10-CM | POA: Insufficient documentation

## 2017-05-07 DIAGNOSIS — Z9889 Other specified postprocedural states: Secondary | ICD-10-CM | POA: Insufficient documentation

## 2017-05-07 DIAGNOSIS — Z794 Long term (current) use of insulin: Secondary | ICD-10-CM | POA: Insufficient documentation

## 2017-05-07 LAB — POCT GLYCOSYLATED HEMOGLOBIN (HGB A1C): Hemoglobin A1C: 14

## 2017-05-07 LAB — GLUCOSE, POCT (MANUAL RESULT ENTRY): POC Glucose: 507 mg/dl — AB (ref 70–99)

## 2017-05-07 MED ORDER — CETIRIZINE HCL 10 MG PO TABS: 10.0000 mg | ORAL_TABLET | Freq: Every day | ORAL | 1 refills | Status: DC

## 2017-05-07 MED ORDER — INSULIN ASPART 100 UNIT/ML ~~LOC~~ SOLN
25.0000 [IU] | Freq: Once | SUBCUTANEOUS | Status: AC
  Administered 2017-05-07: 25 [IU] via SUBCUTANEOUS

## 2017-05-07 MED ORDER — METFORMIN HCL 500 MG PO TABS: 1000.0000 mg | ORAL_TABLET | Freq: Two times a day (BID) | ORAL | 3 refills | Status: DC

## 2017-05-07 MED FILL — ?CETIRIZINE HCL 10 MG TABLE: 10 | 30 days supply | Qty: 30 | Fill #0

## 2017-05-07 MED FILL — ?METFORMIN HCL 500MG TABLET: 500 | 30 days supply | Qty: 120 | Fill #0

## 2017-05-08 MED FILL — !NOVOLOG MIX 70/30 VIAL: 70-30/ML | 20 days supply | Qty: 10 | Fill #3

## 2017-05-08 NOTE — Progress Notes (Signed)
Subjective:  Patient ID: [name], male    DOB: 10-14-1961  Age: 56 y.o. MRN: [id number]  CC: Hypertension   HPI FEDOR KAZMIERSKI s a 56 year old male with a history of type 2 diabetes mellitus(A1c 14.0 up from 7.0, three months ago ), hypertension, hyperlipidemia, gout, osteoarthritis of the knee who presents today for follow-up of Hypertension.  At his last visit I had switched from lisinopril to lisinopril/HCTZ for optimization of blood pressure and also because he complained of pedal edema and his blood pressure is better today.  His blood sugar is 507 in the clinic today and this is greater than 2 hours post breakfast. He endorses forgetting to take his NovoLog 70/30 today and on further questioning he is unsure if he has missed his doses on other occasions.  I have reviewed his blood sugar log which reveals his fasting sugars in the 300-400 range in the last 1 week but then he has fluctuating sugars between 180-300 and he is unable to differentiate between his fasting and random sugars. Denies episodes of hypoglycemia.  Yesterday he started been taking OTC Coricidin for cough which he has had for the last 2 days with associated postnasal drip    Past Medical History:  Diagnosis Date  . Gouty bursitis, elbow  10/18/2015  . Hx of gout   . Hyperlipidemia   . Hypertension   . Knee pain, chronic   . Type II diabetes mellitus ([place])     Past Surgical History:  Procedure Laterality Date  . KNEE ARTHROSCOPY Bilateral 12/29/2015   Procedure: ARTHROSCOPY KNEE BILATERAL WITH  PARTIAL MEDIAL AND LATERAL MENISCECTOMY AND FOUR COMPARTMENT SYNOVECTOMY , CHONDRALPLASTIES, PATELLA-FEMORAL CHONDRALPLASTIES BILATERALLY;  Surgeon: Dorna Leitz, MD;  Location: [facility];  Service: Orthopedics;  Laterality: Bilateral;  . NO PAST SURGERIES      No Known Allergies   Outpatient Medications Prior to Visit  Medication Sig Dispense Refill  . amLODipine (NORVASC) 10 MG tablet Take 1 tablet (10 mg  total) by mouth daily. 30 tablet 5  . aspirin 81 MG EC tablet Take 1 tablet (81 mg total) by mouth daily. 90 tablet 1  . atorvastatin (LIPITOR) 40 MG tablet Take 1 tablet (40 mg total) by mouth daily at 6 PM. 30 tablet 3  . Blood Glucose Monitoring Suppl (TRUE METRIX METER) w/Device KIT 1 Device by Does not apply route 3 (three) times daily. 1 kit 0  . colchicine 0.6 MG tablet Take 0.6 mg by mouth daily.    . ferrous sulfate 325 (65 FE) MG tablet TAKE 1 TABLET BY MOUTH DAILY WITH BREAKFAST. 30 tablet 3  . glucose blood (TRUE METRIX BLOOD GLUCOSE TEST) test strip Use as instructed 3 times daily 100 each 12  . insulin aspart protamine- aspart (NOVOLOG MIX 70/30) (70-30) 100 UNIT/ML injection Inject 0.25 mLs (25 Units total) into the skin 2 (two) times daily with a meal. 10 mL 10  . lisinopril-hydrochlorothiazide (PRINZIDE,ZESTORETIC) 20-25 MG tablet Take 1 tablet by mouth daily. 30 tablet 3  . metoprolol succinate (TOPROL-XL) 25 MG 24 hr tablet Take 1 tablet (25 mg total) by mouth daily. 30 tablet 5  . TRUEPLUS INSULIN SYRINGE 30G X 5/16" 0.5 ML MISC USE FOR INSULIN INJECTION AS DIRECTED 100 each 2  . TRUEPLUS LANCETS 28G MISC 1 Device by Does not apply route 3 (three) times daily. 90 each 12  . metFORMIN (GLUCOPHAGE) 500 MG tablet Take 1 tablet (500 mg total) by mouth 2 (two) times daily with  a meal. 60 tablet 3  . bisacodyl (DULCOLAX) 5 MG EC tablet Take 1 tablet (5 mg total) by mouth daily as needed for moderate constipation. (Patient not taking: Reported on 01/21/2017) 30 tablet 0  . HYDROcodone-acetaminophen (NORCO) 5-325 MG tablet Take 1 tablet by mouth every 8 (eight) hours as needed for moderate pain. (Patient not taking: Reported on 03/20/2017) 20 tablet 0  . traZODone (DESYREL) 50 MG tablet Take 1 tablet (50 mg total) by mouth at bedtime. (Patient not taking: Reported on 05/07/2017) 30 tablet 3   No facility-administered medications prior to visit.     ROS Review of Systems Constitutional:  Negative for activity change and appetite change.  HENT: Negative for sinus pressure and sore throat.   Respiratory: Negative for chest tightness, shortness of breath and wheezing.   Cardiovascular: Negative for chest pain and palpitations, Positive for pedal edema.  Gastrointestinal: Negative for abdominal distention, abdominal pain and constipation.  Genitourinary: Negative.   Musculoskeletal: Positive for knee pain  Skin: old rash on legs Neurological: No seizures, no headache Psychiatric/Behavioral: Negative for behavioral problems and dysphoric mood.   Objective:  BP 135/83   Pulse 96   Temp 98 F (36.7 C) (Oral)   Wt 247 lb 6.4 oz (112.2 kg)   SpO2 97%   BMI 41.17 kg/m   BP/Weight 05/07/2017 03/23/2129 06/24/5783  Systolic BP 696 295 284  Diastolic BP 83 89 132  Wt. (Lbs) 247.4 249 241  BMI 41.17 41.44 40.1      Physical Exam Constitutional: He is oriented to person, place, and time. He appears well-developed and well-nourished.  Cardiovascular: Normal rate, normal heart sounds and intact distal pulses. Bilateral 1+ nonpitting pedal edema   No murmur heard. Pulmonary/Chest: Effort normal and breath sounds normal. He has no wheezes. He has no rales. He exhibits no tenderness.  Abdominal: Soft. Bowel sounds are normal. He exhibits no distension and no mass. There is no tenderness.  Musculoskeletal: He exhibits tenderness (tenderness on flexion, extension; unstable).  Neurological: He is alert and oriented to person, place, and time.  Skin: Xerosis with associated flaking and scabs in  lower extremities   Lab Results  Component Value Date   HGBA1C 14.0 05/07/2017    Assessment & Plan:   1. IDDM (insulin dependent diabetes mellitus) ([place]) Uncontrolled with A1c of 14.0 which has trended up significantly from 7.0 three months ago Compliance with medications cannot be ascertained - he does have some problems with comprehension Blood sugar is 507 today and he endorses  forgetting to take his morning dose of NovoLog 70/30 25 units of NovoLog administered, patient observed and blood sugar repeated Increased dose of metformin I'll review blood sugar log at next visit and up titrate his NovoLog 70/30 Schedule with clinical pharmacist for diabetic teaching re: Diabetic diet, monitoring her blood sugars - POCT glucose (manual entry) - POCT glycosylated hemoglobin (Hb A1C) - metFORMIN (GLUCOPHAGE) 500 MG tablet; Take 2 tablets (1,000 mg total) by mouth 2 (two) times daily with a meal.  Dispense: 120 tablet; Refill: 3 - insulin aspart (novoLOG) injection 25 Units; Inject 0.25 mLs (25 Units total) into the skin once.  2. Cough Could be secondary to postnasal drip Advised to use Zyrtec  3. Essential hypertension Blood pressure is controlled Continue antihypertensive   Meds ordered this encounter  Medications  . metFORMIN (GLUCOPHAGE) 500 MG tablet    Sig: Take 2 tablets (1,000 mg total) by mouth 2 (two) times daily with a meal.  Dispense:  120 tablet    Refill:  3    Discontinue previous dose  . cetirizine (ZYRTEC) 10 MG tablet    Sig: Take 1 tablet (10 mg total) by mouth daily.    Dispense:  30 tablet    Refill:  1  . insulin aspart (novoLOG) injection 25 Units    Follow-up: Return in about 1 month (around 06/06/2017) for follow up of Diabetes; please schedule with stcaey - next avaialble for diabetic teaching.   Arnoldo Morale MD

## 2017-05-13 ENCOUNTER — Ambulatory Visit: Payer: Self-pay | Attending: Family Medicine | Admitting: Pharmacist

## 2017-05-13 DIAGNOSIS — Z794 Long term (current) use of insulin: Secondary | ICD-10-CM | POA: Insufficient documentation

## 2017-05-13 DIAGNOSIS — IMO0001 Reserved for inherently not codable concepts without codable children: Secondary | ICD-10-CM

## 2017-05-13 DIAGNOSIS — E119 Type 2 diabetes mellitus without complications: Secondary | ICD-10-CM | POA: Insufficient documentation

## 2017-05-13 NOTE — Patient Instructions (Addendum)
Thanks for coming to see [name]!  Follow up with Dr. Jarold Song as directed  Replace ice cream with [place] yogurt Try to eat salads with chicken, cabbage and fish, and broccoli and [place] slices You can eat watermelon and cantaloupe  You can try the diet juices or V8 You can eat cottage cheese  Carbohydrate Counting for Diabetes Mellitus, Adult Carbohydrate counting is a method for keeping track of how many carbohydrates you eat. Eating carbohydrates naturally increases the amount of sugar (glucose) in the blood. Counting how many carbohydrates you eat helps keep your blood glucose within normal limits, which helps you manage your diabetes (diabetes mellitus). It is important to know how many carbohydrates you can safely have in each meal. This is different for every person. A diet and nutrition specialist (registered dietitian) can help you make a meal plan and calculate how many carbohydrates you should have at each meal and snack. Carbohydrates are found in the following foods:  Grains, such as breads and cereals.  Dried beans and soy products.  Starchy vegetables, such as potatoes, peas, and corn.  Fruit and fruit juices.  Milk and yogurt.  Sweets and snack foods, such as cake, cookies, candy, chips, and soft drinks.  How do I count carbohydrates? There are two ways to count carbohydrates in food. You can use either of the methods or a combination of both. Reading "Nutrition Facts" on packaged food The "Nutrition Facts" list is included on the labels of almost all packaged foods and beverages in the [place] It includes:  The serving size.  Information about nutrients in each serving, including the grams (g) of carbohydrate per serving.  To use the "Nutrition Facts":  Decide how many servings you will have.  Multiply the number of servings by the number of carbohydrates per serving.  The resulting number is the total amount of carbohydrates that you will be having.  Learning standard  serving sizes of other foods When you eat foods containing carbohydrates that are not packaged or do not include "Nutrition Facts" on the label, you need to measure the servings in order to count the amount of carbohydrates:  Measure the foods that you will eat with a food scale or measuring cup, if needed.  Decide how many standard-size servings you will eat.  Multiply the number of servings by 15. Most carbohydrate-rich foods have about 15 g of carbohydrates per serving. ? For example, if you eat 8 oz (170 g) of strawberries, you will have eaten 2 servings and 30 g of carbohydrates (2 servings x 15 g = 30 g).  For foods that have more than one food mixed, such as soups and casseroles, you must count the carbohydrates in each food that is included.  The following list contains standard serving sizes of common carbohydrate-rich foods. Each of these servings has about 15 g of carbohydrates:   hamburger bun or  English muffin.   oz (15 mL) syrup.   oz (14 g) jelly.  1 slice of bread.  1 six-inch tortilla.  3 oz (85 g) cooked rice or pasta.  4 oz (113 g) cooked dried beans.  4 oz (113 g) starchy vegetable, such as peas, corn, or potatoes.  4 oz (113 g) hot cereal.  4 oz (113 g) mashed potatoes or  of a large baked potato.  4 oz (113 g) canned or frozen fruit.  4 oz (120 mL) fruit juice.  4-6 crackers.  6 chicken nuggets.  6 oz (170 g) unsweetened  dry cereal.  6 oz (170 g) plain fat-free yogurt or yogurt sweetened with artificial sweeteners.  8 oz (240 mL) milk.  8 oz (170 g) fresh fruit or one small piece of fruit.  24 oz (680 g) popped popcorn.  Example of carbohydrate counting Sample meal  3 oz (85 g) chicken breast.  6 oz (170 g) brown rice.  4 oz (113 g) corn.  8 oz (240 mL) milk.  8 oz (170 g) strawberries with sugar-free whipped topping. Carbohydrate calculation 1. Identify the foods that contain  carbohydrates: ? Rice. ? Corn. ? Milk. ? Strawberries. 2. Calculate how many servings you have of each food: ? 2 servings rice. ? 1 serving corn. ? 1 serving milk. ? 1 serving strawberries. 3. Multiply each number of servings by 15 g: ? 2 servings rice x 15 g = 30 g. ? 1 serving corn x 15 g = 15 g. ? 1 serving milk x 15 g = 15 g. ? 1 serving strawberries x 15 g = 15 g. 4. Add together all of the amounts to find the total grams of carbohydrates eaten: ? 30 g + 15 g + 15 g + 15 g = 75 g of carbohydrates total. This information is not intended to replace advice given to you by your health care provider. Make sure you discuss any questions you have with your health care provider. Document Released: 11/04/2005 Document Revised: 05/24/2016 Document Reviewed: 04/17/2016 Elsevier Interactive Patient Education  2018 [employer].   Type 2 Diabetes Mellitus, Diagnosis, Adult Type 2 diabetes (type 2 diabetes mellitus) is a long-term (chronic) disease. It may be caused by one or both of these problems:  Your body does not make enough of a hormone called insulin.  Your body does not react in a normal way to insulin that it makes.  Insulin lets sugars (glucose) go into cells in the body. This gives you energy. If you have type 2 diabetes, sugars cannot get into cells. This causes high blood sugar (hyperglycemia). Your doctor will set treatment goals for you. Generally, you should have these blood sugar levels:  Before meals (preprandial): 80-130 mg/dL (4.4-7.2 mmol/L).  After meals (postprandial): below 180 mg/dL (10 mmol/L).  A1c (hemoglobin A1c) level: less than 7%.  Follow these instructions at home: Questions to Ask Your Doctor  You may want to ask these questions:  Do I need to meet with a diabetes educator?  Where can I find a support group for people with diabetes?  What equipment will I need to care for myself at home?  What diabetes medicines do I need? When should I take  them?  How often do I need to check my blood sugar?  What number can I call if I have questions?  When is my next doctor's visit?  General instructions  Take over-the-counter and prescription medicines only as told by your doctor.  Keep all follow-up visits as told by your doctor. This is important. Contact a doctor if:  Your blood sugar is at or above 240 mg/dL (13.3 mmol/L) for 2 days in a row.  You have been sick or have had a fever for 2 days or more and you are not getting better.  You have any of these problems for more than 6 hours: ? You cannot eat or drink. ? You feel sick to your stomach (nauseous). ? You throw up (vomit). ? You have watery poop (diarrhea). Get help right away if:  Your blood sugar is  lower than 54 mg/dL (3 mmol/L).  You get confused.  You have trouble: ? Thinking clearly. ? Breathing.  You have moderate or large ketone levels in your pee (urine). This information is not intended to replace advice given to you by your health care provider. Make sure you discuss any questions you have with your health care provider. Document Released: 08/13/2008 Document Revised: 04/11/2016 Document Reviewed: 12/08/2015 Elsevier Interactive Patient Education  2018 [employer].   Type 2 Diabetes Mellitus, Self Care, Adult When you have type 2 diabetes (type 2 diabetes mellitus), you must keep your blood sugar (glucose) under control. You can do this with:  Nutrition.  Exercise.  Lifestyle changes.  Medicines or insulin, if needed.  Support from your doctors and others.  How do I manage my blood sugar?  Check your blood sugar level every day, as often as told.  Call your doctor if your blood sugar is above your goal numbers for 2 tests in a row.  Have your A1c (hemoglobin A1c) level checked at least two times a year. Have it checked more often if your doctor tells you to. Your doctor will set treatment goals for you. Generally, you should have  these blood sugar levels:  Before meals (preprandial): 80-130 mg/dL (4.4-7.2 mmol/L).  After meals (postprandial): lower than 180 mg/dL (10 mmol/L).  A1c level: less than 7%.  What do I need to know about high blood sugar? High blood sugar is called hyperglycemia. Know the signs of high blood sugar. Signs may include:  Feeling: ? Thirsty. ? Hungry. ? Very tired.  Needing to pee (urinate) more than usual.  Blurry vision.  What do I need to know about low blood sugar? Low blood sugar is called hypoglycemia. This is when blood sugar is at or below 70 mg/dL (3.9 mmol/L). Symptoms may include:  Feeling: ? Hungry. ? Worried or nervous (anxious). ? Sweaty and clammy. ? Confused. ? Dizzy. ? Sleepy. ? Sick to your stomach (nauseous).  Having: ? A fast heartbeat (palpitations). ? A headache. ? A change in your vision. ? Jerky movements that you cannot control (seizure). ? Nightmares. ? Tingling or no feeling (numbness) around the mouth, lips, or tongue.  Having trouble with: ? Talking. ? Paying attention (concentrating). ? Moving (coordination). ? Sleeping.  Shaking.  Passing out (fainting).  Getting upset easily (irritability).  Treating low blood sugar  To treat low blood sugar, eat or drink something sugary right away. If you can think clearly and swallow safely, follow the 15:15 rule:  Take 15 grams of a fast-acting carb (carbohydrate). Some fast-acting carbs are: ? 1 tube of glucose gel. ? 3 sugar tablets (glucose pills). ? 6-8 pieces of hard candy. ? 4 oz (120 mL) of fruit juice. ? 4 oz (120 mL) regular (not diet) soda.  Check your blood sugar 15 minutes after you take the carb.  If your blood sugar is still at or below 70 mg/dL (3.9 mmol/L), take 15 grams of a carb again.  If your blood sugar does not go above 70 mg/dL (3.9 mmol/L) after 3 tries, get help right away.  After your blood sugar goes back to normal, eat a meal or a snack within 1  hour.  Treating very low blood sugar If your blood sugar is at or below 54 mg/dL (3 mmol/L), you have very low blood sugar (severe hypoglycemia). This is an emergency. Do not wait to see if the symptoms will go away. Get medical help right away.  Call your local emergency services (911 in the [place] Do not drive yourself to the hospital. If you have very low blood sugar and you cannot eat or drink, you may need a glucagon shot (injection). A family member or friend should learn how to check your blood sugar and how to give you a glucagon shot. Ask your doctor if you need to have a glucagon shot kit at home. What else is important to manage my diabetes? Medicine Follow these instructions about insulin and diabetes medicines:  Take them as told by your doctor.  Adjust them as told by your doctor.  Do not run out of them.  Having diabetes can raise your risk for other long-term conditions. These include heart or kidney disease. Your doctor may prescribe medicines to help prevent problems from diabetes. Food   Make healthy food choices. These include: ? Chicken, fish, egg whites, and beans. ? Oats, whole wheat, bulgur, brown rice, quinoa, and millet. ? Fresh fruits and vegetables. ? Low-fat dairy products. ? Nuts, avocado, olive oil, and canola oil.  Make a food plan with a specialist (dietitian).  Follow instructions from your doctor about what you cannot eat or drink.  Drink enough fluid to keep your pee (urine) clear or pale yellow.  Eat healthy snacks between healthy meals.  Keep track of carbs that you eat. Read food labels. Learn food serving sizes.  Follow your sick day plan when you cannot eat or drink normally. Make this plan with your doctor so it is ready to use. Activity  Exercise at least 3 times a week.  Do not go more than 2 days without exercising.  Talk with your doctor before you start a new exercise. Your doctor may need to adjust your insulin, medicines, or  food. Lifestyle   Do not use any tobacco products. These include cigarettes, chewing tobacco, and e-cigarettes.If you need help quitting, ask your doctor.  Ask your doctor how much alcohol is safe for you.  Learn to deal with stress. If you need help with this, ask your doctor. Body care  Stay up to date with your shots (immunizations).  Have your eyes and feet checked by a doctor as often as told.  Check your skin and feet every day. Check for cuts, bruises, redness, blisters, or sores.  Brush your teeth and gums two times a day.  Floss at least one time a day.  Go to the dentist least one time every 6 months.  Stay at a healthy weight. General instructions   Take over-the-counter and prescription medicines only as told by your doctor.  Share your diabetes care plan with: ? Your work or school. ? People you live with.  Check your pee (urine) for ketones: ? When you are sick. ? As told by your doctor.  Carry a card or wear jewelry that says that you have diabetes.  Ask your doctor: ? Do I need to meet with a diabetes educator? ? Where can I find a support group for people with diabetes?  Keep all follow-up visits as told by your doctor. This is important. Where to find more information: To learn more about diabetes, visit:  American Diabetes Association: www.diabetes.org  American Association of Diabetes Educators: www.diabeteseducator.org/patient-resources  This information is not intended to replace advice given to you by your health care provider. Make sure you discuss any questions you have with your health care provider. Document Released: 02/26/2016 Document Revised: 04/11/2016 Document Reviewed: 12/08/2015 Elsevier Interactive Patient Education  2018 [employer].

## 2017-05-13 NOTE — Progress Notes (Signed)
    S:     Chief Complaint  Patient presents with  . Diabetes    Patient arrives in good spirits.  Presents for diabetes education at the request of Dr. Jarold Song. Patient was referred on 05/07/17.  Patient was last seen by Primary Care Provider on 05/07/17.   Patient reports adherence with medications.  Current diabetes medications include: Novolog 70/30 25 units BID  He reports that he is motivated to make changes because he is afraid that he will have a stroke.   Patient denies hypoglycemic events.  Patient reported dietary habits: Trying to eat better. He has ideas on what to eat but wants to know if he is correct. He reports drinking juice and will add water to his orange juice to make to less sugary. He also reports eating a lot of ice cream and mac and cheese.  Patient reported exercise habits: none   Patient reports nocturia.  Patient reports neuropathy. Patient reports visual changes. Patient reports self foot exams.    O:  Physical Exam   ROS   Lab Results  Component Value Date   HGBA1C 14.0 05/07/2017   There were no vitals filed for this visit.  Home fasting CBG: 300s    A/P: Diabetes longstanding currently UNcontrolled based on A1c of 14. Patient denies hypoglycemic events and is able to verbalize appropriate hypoglycemia management plan. Patient reports adherence with medication. Control is suboptimal due to noncompliance to medications in past, dietary indiscretion, and sedentary lifestyle.  Provided education on the following: what diabetes is, what the A1c represents, goal A1c, goal blood glucose readings, and dietary changes that can lower blood glucose. Patient came up with meal ideas that were low carb that he will try to eat. He appears to be very motivated to make changes upon understanding the complications of diabetes.  Next A1C anticipated September 2018.    Written patient instructions provided.  Total time in face to face counseling 30 minutes.     Follow up in Pharmacist Clinic Visit PRN, next visit with Dr. Jarold Song.   Patient seen with Maple Mirza, PharmD Candidate and Mechele Claude, PharmD Candidate

## 2017-05-19 ENCOUNTER — Other Ambulatory Visit: Payer: Self-pay | Admitting: *Deleted

## 2017-05-19 DIAGNOSIS — Z794 Long term (current) use of insulin: Principal | ICD-10-CM

## 2017-05-19 DIAGNOSIS — IMO0001 Reserved for inherently not codable concepts without codable children: Secondary | ICD-10-CM

## 2017-05-19 DIAGNOSIS — E119 Type 2 diabetes mellitus without complications: Principal | ICD-10-CM

## 2017-05-19 MED ORDER — INSULIN ASPART PROT & ASPART (70-30 MIX) 100 UNIT/ML ~~LOC~~ SUSP: 25.0000 [IU] | Freq: Two times a day (BID) | SUBCUTANEOUS | 3 refills | Status: DC

## 2017-05-19 NOTE — Telephone Encounter (Signed)
PRINTED FOR PASS PROGRAM 

## 2017-05-26 ENCOUNTER — Other Ambulatory Visit: Payer: Self-pay | Admitting: *Deleted

## 2017-05-26 DIAGNOSIS — IMO0001 Reserved for inherently not codable concepts without codable children: Secondary | ICD-10-CM

## 2017-05-26 DIAGNOSIS — E119 Type 2 diabetes mellitus without complications: Principal | ICD-10-CM

## 2017-05-26 DIAGNOSIS — Z794 Long term (current) use of insulin: Principal | ICD-10-CM

## 2017-05-26 MED ORDER — INSULIN ASPART PROT & ASPART (70-30 MIX) 100 UNIT/ML ~~LOC~~ SUSP: 25.0000 [IU] | Freq: Two times a day (BID) | SUBCUTANEOUS | 3 refills | Status: DC

## 2017-05-26 NOTE — Telephone Encounter (Signed)
PRINTED FOR PASS PROGRAM 

## 2017-05-29 ENCOUNTER — Other Ambulatory Visit: Payer: Self-pay | Admitting: Family Medicine

## 2017-05-29 DIAGNOSIS — E119 Type 2 diabetes mellitus without complications: Principal | ICD-10-CM

## 2017-05-29 DIAGNOSIS — IMO0001 Reserved for inherently not codable concepts without codable children: Secondary | ICD-10-CM

## 2017-05-29 DIAGNOSIS — Z794 Long term (current) use of insulin: Principal | ICD-10-CM

## 2017-05-29 DIAGNOSIS — I10 Essential (primary) hypertension: Secondary | ICD-10-CM

## 2017-05-29 MED FILL — AMLODIPINE BESYLATE 10 MG T: 10 | 30 days supply | Qty: 30 | Fill #5

## 2017-05-29 MED FILL — ?CETIRIZINE HCL 10 MG TABLE: 10 | 30 days supply | Qty: 30 | Fill #1

## 2017-05-29 MED FILL — FERROUS SULFATE 325 MG TAB: 325 (65 FE) | 30 days supply | Qty: 30 | Fill #3

## 2017-05-29 MED FILL — METOPROLOL SUCC ER 25 MG TA: 25 | 30 days supply | Qty: 30 | Fill #5

## 2017-05-29 MED FILL — !NOVOLOG MIX 70/30 VIAL: 70-30/ML | 20 days supply | Qty: 10 | Fill #4

## 2017-05-29 MED FILL — ?ATORVASTATIN 40MG TABLET: 40 | 30 days supply | Qty: 30 | Fill #3

## 2017-05-29 MED FILL — LISINOPRIL-HCTZ 20-25 MG TA: 20-25 | 30 days supply | Qty: 30 | Fill #2

## 2017-06-04 ENCOUNTER — Encounter: Payer: Self-pay | Admitting: Family Medicine

## 2017-06-04 ENCOUNTER — Ambulatory Visit: Payer: Self-pay | Attending: Family Medicine | Admitting: Family Medicine

## 2017-06-04 VITALS — BP 153/82 | HR 80 | Temp 97.7°F | Ht 65.0 in | Wt 253.2 lb

## 2017-06-04 DIAGNOSIS — Z7982 Long term (current) use of aspirin: Secondary | ICD-10-CM | POA: Insufficient documentation

## 2017-06-04 DIAGNOSIS — Z79899 Other long term (current) drug therapy: Secondary | ICD-10-CM | POA: Insufficient documentation

## 2017-06-04 DIAGNOSIS — I10 Essential (primary) hypertension: Secondary | ICD-10-CM | POA: Insufficient documentation

## 2017-06-04 DIAGNOSIS — E785 Hyperlipidemia, unspecified: Secondary | ICD-10-CM | POA: Insufficient documentation

## 2017-06-04 DIAGNOSIS — E119 Type 2 diabetes mellitus without complications: Secondary | ICD-10-CM | POA: Insufficient documentation

## 2017-06-04 DIAGNOSIS — M109 Gout, unspecified: Secondary | ICD-10-CM | POA: Insufficient documentation

## 2017-06-04 DIAGNOSIS — Z794 Long term (current) use of insulin: Secondary | ICD-10-CM | POA: Insufficient documentation

## 2017-06-04 DIAGNOSIS — IMO0001 Reserved for inherently not codable concepts without codable children: Secondary | ICD-10-CM

## 2017-06-04 LAB — GLUCOSE, POCT (MANUAL RESULT ENTRY): POC GLUCOSE: 304 mg/dL — AB (ref 70–99)

## 2017-06-04 MED ORDER — INSULIN ASPART PROT & ASPART (70-30 MIX) 100 UNIT/ML ~~LOC~~ SUSP: 35.0000 [IU] | Freq: Two times a day (BID) | SUBCUTANEOUS | 3 refills | Status: DC

## 2017-06-04 MED ORDER — METOPROLOL SUCCINATE ER 50 MG PO TB24: 50.0000 mg | ORAL_TABLET | Freq: Every day | ORAL | 3 refills | Status: DC

## 2017-06-04 MED ORDER — INSULIN ASPART PROT & ASPART (70-30 MIX) 100 UNIT/ML ~~LOC~~ SUSP
35.0000 [IU] | Freq: Two times a day (BID) | SUBCUTANEOUS | 3 refills | Status: DC
Start: 2017-06-04 — End: 2017-08-11

## 2017-06-04 MED FILL — METOPROLOL SUCC ER 50 MG TA: 50 | 30 days supply | Qty: 30 | Fill #0

## 2017-06-04 MED FILL — !NOVOLOG MIX 70/30 VIAL: 70-30/ML | 28 days supply | Qty: 20 | Fill #0

## 2017-06-04 NOTE — Patient Instructions (Signed)
-  Increase NovoLog 70/30 to 35 units twice daily. -Increase Toprol-XL to 50 mg daily.

## 2017-06-04 NOTE — Progress Notes (Signed)
Subjective:  Patient ID: [name], male    DOB: [birth year]  Age: 56 y.o. MRN: [id number]  CC: Diabetes   HPI [name] is a 56 year old male with a history of type 2 diabetes mellitus(A1c 14.0 up from 7.0),hypertension, hyperlipidemia, gout, osteoarthritis of the knee who presents today for follow-up of Hypertension and diabetes mellitus.  His blood sugar in the clinic is 304 and he endorses compliance with his medications but not with exercise or diabetic diet. He denies hypoglycemia and does not have his blood sugar log with him. He has been taking 25 units twice daily of NovoLog 70/30  His blood pressure is elevated today despite taking his antihypertensive. He denies chest pains, shortness of breath.  He has no other acute concerns today.  Past Medical History:  Diagnosis Date  . Gouty bursitis, elbow  10/18/2015  . Hx of gout   . Hyperlipidemia   . Hypertension   . Knee pain, chronic   . Type II diabetes mellitus ([place])     Past Surgical History:  Procedure Laterality Date  . KNEE ARTHROSCOPY Bilateral 12/29/2015   Procedure: ARTHROSCOPY KNEE BILATERAL WITH  PARTIAL MEDIAL AND LATERAL MENISCECTOMY AND FOUR COMPARTMENT SYNOVECTOMY , CHONDRALPLASTIES, PATELLA-FEMORAL CHONDRALPLASTIES BILATERALLY;  Surgeon: Dorna Leitz, MD;  Location: [facility];  Service: Orthopedics;  Laterality: Bilateral;  . NO PAST SURGERIES      No Known Allergies    Outpatient Medications Prior to Visit  Medication Sig Dispense Refill  . amLODipine (NORVASC) 10 MG tablet Take 1 tablet (10 mg total) by mouth daily. 30 tablet 5  . aspirin 81 MG EC tablet Take 1 tablet (81 mg total) by mouth daily. 90 tablet 1  . atorvastatin (LIPITOR) 40 MG tablet Take 1 tablet (40 mg total) by mouth daily at 6 PM. 30 tablet 3  . Blood Glucose Monitoring Suppl (TRUE METRIX METER) w/Device KIT 1 Device by Does not apply route 3 (three) times daily. 1 kit 0  . cetirizine (ZYRTEC) 10 MG tablet Take 1 tablet (10  mg total) by mouth daily. 30 tablet 1  . colchicine 0.6 MG tablet Take 0.6 mg by mouth daily.    . ferrous sulfate 325 (65 FE) MG tablet TAKE 1 TABLET BY MOUTH DAILY WITH BREAKFAST. 30 tablet 3  . glucose blood (TRUE METRIX BLOOD GLUCOSE TEST) test strip Use as instructed 3 times daily 100 each 12  . lisinopril-hydrochlorothiazide (PRINZIDE,ZESTORETIC) 20-25 MG tablet Take 1 tablet by mouth daily. 30 tablet 3  . metFORMIN (GLUCOPHAGE) 500 MG tablet Take 2 tablets (1,000 mg total) by mouth 2 (two) times daily with a meal. 120 tablet 3  . traZODone (DESYREL) 50 MG tablet Take 1 tablet (50 mg total) by mouth at bedtime. 30 tablet 3  . TRUEPLUS INSULIN SYRINGE 30G X 5/16" 0.5 ML MISC USE FOR INSULIN INJECTION AS DIRECTED 100 each 2  . TRUEPLUS LANCETS 28G MISC 1 Device by Does not apply route 3 (three) times daily. 90 each 12  . insulin aspart protamine- aspart (NOVOLOG MIX 70/30) (70-30) 100 UNIT/ML injection Inject 0.25 mLs (25 Units total) into the skin 2 (two) times daily with a meal. 60 mL 3  . metoprolol succinate (TOPROL-XL) 25 MG 24 hr tablet Take 1 tablet (25 mg total) by mouth daily. 30 tablet 5   No facility-administered medications prior to visit.     ROS Review of Systems Constitutional: Negative for activity change and appetite change.  HENT: Negative for sinus pressure and  sore throat.   Respiratory: Negative for chest tightness, shortness of breath and wheezing.   Cardiovascular: Negative for chest pain and palpitations, Positive for pedal edema.  Gastrointestinal: Negative for abdominal distention, abdominal pain and constipation.  Genitourinary: Negative.   Musculoskeletal: Positive for knee pain  Skin: old rash on legs Neurological: No seizures, no headache Psychiatric/Behavioral: Negative for behavioral problems and dysphoric mood.   Objective:  BP (!) 153/82   Pulse 80   Temp 97.7 F (36.5 C) (Oral)   Ht 5' 5" (1.651 m)   Wt 253 lb 3.2 oz (114.9 kg)   SpO2 97%    BMI 42.13 kg/m   BP/Weight 06/04/2017 5/64/3329 03/18/8840  Systolic BP 660 630 160  Diastolic BP 82 83 89  Wt. (Lbs) 253.2 247.4 249  BMI 42.13 41.17 41.44      Physical Exam Constitutional: He is oriented to person, place, and time. He appears well-developed and well-nourished.  Cardiovascular: Normal rate, normal heart sounds and intact distal pulses. Bilateral 1+ nonpitting pedal edema   No murmur heard. Pulmonary/Chest: Effort normal and breath sounds normal. He has no wheezes. He has no rales. He exhibits no tenderness.  Abdominal: Soft. Bowel sounds are normal. He exhibits no distension and no mass. There is no tenderness.  Musculoskeletal: He exhibits tenderness (tenderness on flexion, extension; unstable).  Neurological: He is alert and oriented to person, place, and time.  Skin: Xerosis with associated flaking and scabs in  lower extremities  Lab Results  Component Value Date   HGBA1C 14.0 05/07/2017   CMP Latest Ref Rng & Units 10/18/2016 10/12/2016 10/11/2016  Glucose 65 - 99 mg/dL 79 92 101(H)  BUN 7 - 25 mg/dL _0 Creatinine 0.70 - 1.33 mg/dL 0.95 0.99 0.84  Sodium 135 - 146 mmol/L 141 138 137  Potassium 3.5 - 5.3 mmol/L 3.5 3.9 3.6  Chloride 98 - 110 mmol/L 105 103 103  CO2 20 - 31 mmol/L _1 Calcium 8.6 - 10.3 mg/dL 9.6 9.4 9.6  Total Protein 6.1 - 8.1 g/dL 8.2(H) - -  Total Bilirubin 0.2 - 1.2 mg/dL 0.6 - -  Alkaline Phos 40 - 115 U/L 77 - -  AST 10 - 35 U/L 14 - -  ALT 9 - 46 U/L 11 - -    Lipid Panel     Component Value Date/Time   CHOL 158 10/10/2016 1125   TRIG 117 10/10/2016 1125   HDL 35 (L) 10/10/2016 1125   CHOLHDL 4.5 10/10/2016 1125   VLDL 23 10/10/2016 1125   LDLCALC 100 (H) 10/10/2016 1125     Assessment & Plan:   1. IDDM (insulin dependent diabetes mellitus) ([place]) Uncontrolled with A1c of 14.0, CBG of 304 Increased dose of NovoLog 70/30 Diabetic diet - POCT glucose (manual entry) - insulin aspart protamine- aspart  (NOVOLOG MIX 70/30) (70-30) 100 UNIT/ML injection; Inject 0.35 mLs (35 Units total) into the skin 2 (two) times daily with a meal.  Dispense: 60 mL; Refill: 3  2. Essential hypertension Uncontrolled Low-sodium diet Increased dose of metoprolol Low-sodium diet, lifestyle modification - metoprolol succinate (TOPROL-XL) 50 MG 24 hr tablet; Take 1 tablet (50 mg total) by mouth daily.  Dispense: 30 tablet; Refill: 3   Meds ordered this encounter  Medications  . metoprolol succinate (TOPROL-XL) 50 MG 24 hr tablet    Sig: Take 1 tablet (50 mg total) by mouth daily.    Dispense:  30 tablet    Refill:  3  Discontinue previous dose  . insulin aspart protamine- aspart (NOVOLOG MIX 70/30) (70-30) 100 UNIT/ML injection    Sig: Inject 0.35 mLs (35 Units total) into the skin 2 (two) times daily with a meal.    Dispense:  60 mL    Refill:  3    Discontinue previous dose    Follow-up: Return in about 3 months (around 09/04/2017) for Follow-up on diabetes mellitus.   This note has been created with Surveyor, quantity. Any transcriptional errors are unintentional.     Arnoldo Morale MD

## 2017-07-03 ENCOUNTER — Other Ambulatory Visit: Payer: Self-pay | Admitting: Family Medicine

## 2017-07-03 DIAGNOSIS — IMO0001 Reserved for inherently not codable concepts without codable children: Secondary | ICD-10-CM

## 2017-07-03 DIAGNOSIS — E119 Type 2 diabetes mellitus without complications: Principal | ICD-10-CM

## 2017-07-03 DIAGNOSIS — I10 Essential (primary) hypertension: Secondary | ICD-10-CM

## 2017-07-03 DIAGNOSIS — Z794 Long term (current) use of insulin: Principal | ICD-10-CM

## 2017-07-03 DIAGNOSIS — D509 Iron deficiency anemia, unspecified: Secondary | ICD-10-CM

## 2017-07-03 MED FILL — METOPROLOL SUCC ER 50 MG TA: 50 | 30 days supply | Qty: 30 | Fill #1

## 2017-07-03 MED FILL — ?ATORVASTATIN 40MG TABLET: 40 | 30 days supply | Qty: 30 | Fill #0

## 2017-07-03 MED FILL — TRUE METRIX TEST STRIP: 33 days supply | Qty: 100 | Fill #2

## 2017-07-03 MED FILL — LISINOPRIL-HCTZ 20-25 MG TA: 20-25 | 30 days supply | Qty: 30 | Fill #3

## 2017-07-03 MED FILL — ?CETIRIZINE HCL 10 MG TABLE: 10 | 30 days supply | Qty: 30 | Fill #0

## 2017-07-03 MED FILL — TRUEPLUS SYR 0.5ML 30GX5/16: 30G X 5/16" | 25 days supply | Qty: 100 | Fill #1

## 2017-07-03 MED FILL — AMLODIPINE BESYLATE 10 MG T: 10 | 30 days supply | Qty: 30 | Fill #0

## 2017-07-03 MED FILL — FERROUS SULFATE 325 MG TAB: 325 (65 FE) | 30 days supply | Qty: 30 | Fill #0

## 2017-08-06 ENCOUNTER — Other Ambulatory Visit: Payer: Self-pay | Admitting: Family Medicine

## 2017-08-06 DIAGNOSIS — I10 Essential (primary) hypertension: Secondary | ICD-10-CM

## 2017-08-06 MED FILL — LISINOPRIL-HCTZ 20-25 MG TA: 20-25 | 30 days supply | Qty: 30 | Fill #0

## 2017-08-06 MED FILL — TRUEplus LANCETS 28G MISC: 30 days supply | Qty: 100 | Fill #1

## 2017-08-06 MED FILL — ?METFORMIN HCL 500MG TABLET: 500 | 30 days supply | Qty: 120 | Fill #1

## 2017-08-06 MED FILL — METOPROLOL SUCC ER 50 MG TA: 50 | 30 days supply | Qty: 30 | Fill #2

## 2017-08-06 MED FILL — !NOVOLOG MIX 70/30 VIAL: 70-30/ML | 28 days supply | Qty: 20 | Fill #1

## 2017-08-06 MED FILL — AMLODIPINE BESYLATE 10 MG T: 10 | 30 days supply | Qty: 30 | Fill #1

## 2017-08-06 MED FILL — FERROUS SULFATE 325 MG TAB: 325 (65 FE) | 30 days supply | Qty: 30 | Fill #1

## 2017-08-06 MED FILL — ?CETIRIZINE HCL 10 MG TABLE: 10 | 30 days supply | Qty: 30 | Fill #1

## 2017-08-06 MED FILL — ?ATORVASTATIN 40MG TABLET: 40 | 30 days supply | Qty: 30 | Fill #1

## 2017-08-11 ENCOUNTER — Ambulatory Visit: Payer: Self-pay | Attending: Family Medicine | Admitting: Family Medicine

## 2017-08-11 ENCOUNTER — Encounter: Payer: Self-pay | Admitting: Family Medicine

## 2017-08-11 VITALS — BP 116/71 | HR 89 | Temp 98.3°F | Ht 65.0 in | Wt 251.4 lb

## 2017-08-11 DIAGNOSIS — Z794 Long term (current) use of insulin: Secondary | ICD-10-CM | POA: Insufficient documentation

## 2017-08-11 DIAGNOSIS — G4709 Other insomnia: Secondary | ICD-10-CM

## 2017-08-11 DIAGNOSIS — I10 Essential (primary) hypertension: Secondary | ICD-10-CM | POA: Insufficient documentation

## 2017-08-11 DIAGNOSIS — E785 Hyperlipidemia, unspecified: Secondary | ICD-10-CM | POA: Insufficient documentation

## 2017-08-11 DIAGNOSIS — IMO0001 Reserved for inherently not codable concepts without codable children: Secondary | ICD-10-CM

## 2017-08-11 DIAGNOSIS — E119 Type 2 diabetes mellitus without complications: Secondary | ICD-10-CM | POA: Insufficient documentation

## 2017-08-11 DIAGNOSIS — M170 Bilateral primary osteoarthritis of knee: Secondary | ICD-10-CM | POA: Insufficient documentation

## 2017-08-11 DIAGNOSIS — M109 Gout, unspecified: Secondary | ICD-10-CM | POA: Insufficient documentation

## 2017-08-11 DIAGNOSIS — Z7982 Long term (current) use of aspirin: Secondary | ICD-10-CM | POA: Insufficient documentation

## 2017-08-11 DIAGNOSIS — G4700 Insomnia, unspecified: Secondary | ICD-10-CM | POA: Insufficient documentation

## 2017-08-11 DIAGNOSIS — G44209 Tension-type headache, unspecified, not intractable: Secondary | ICD-10-CM | POA: Insufficient documentation

## 2017-08-11 DIAGNOSIS — Z79899 Other long term (current) drug therapy: Secondary | ICD-10-CM | POA: Insufficient documentation

## 2017-08-11 DIAGNOSIS — G4489 Other headache syndrome: Secondary | ICD-10-CM | POA: Insufficient documentation

## 2017-08-11 LAB — GLUCOSE, POCT (MANUAL RESULT ENTRY)
POC GLUCOSE: 412 mg/dL — AB (ref 70–99)
POC Glucose: 398 mg/dl — AB (ref 70–99)

## 2017-08-11 LAB — POCT GLYCOSYLATED HEMOGLOBIN (HGB A1C): Hemoglobin A1C: >15

## 2017-08-11 MED ORDER — AMLODIPINE BESYLATE 10 MG PO TABS: 10.0000 mg | ORAL_TABLET | Freq: Every day | ORAL | 5 refills | Status: DC

## 2017-08-11 MED ORDER — INSULIN ASPART 100 UNIT/ML ~~LOC~~ SOLN
25.0000 [IU] | Freq: Once | SUBCUTANEOUS | Status: AC
  Administered 2017-08-11: 25 [IU] via SUBCUTANEOUS

## 2017-08-11 MED ORDER — TRAZODONE HCL 50 MG PO TABS: 50.0000 mg | ORAL_TABLET | Freq: Every day | ORAL | 3 refills | Status: DC

## 2017-08-11 MED ORDER — BUTALBITAL-APAP-CAFFEINE 50-325-40 MG PO TABS: 1.0000 | ORAL_TABLET | Freq: Four times a day (QID) | ORAL | 0 refills | Status: DC | PRN

## 2017-08-11 MED ORDER — INSULIN ASPART PROT & ASPART (70-30 MIX) 100 UNIT/ML ~~LOC~~ SUSP: 45.0000 [IU] | Freq: Two times a day (BID) | SUBCUTANEOUS | 3 refills | Status: DC

## 2017-08-11 MED ORDER — METFORMIN HCL 500 MG PO TABS: 1000.0000 mg | ORAL_TABLET | Freq: Two times a day (BID) | ORAL | 3 refills | Status: DC

## 2017-08-11 MED ORDER — ATORVASTATIN CALCIUM 40 MG PO TABS: ORAL_TABLET | ORAL | 5 refills | Status: DC

## 2017-08-11 MED ORDER — METOPROLOL SUCCINATE ER 50 MG PO TB24: 50.0000 mg | ORAL_TABLET | Freq: Every day | ORAL | 3 refills | Status: DC

## 2017-08-11 MED FILL — BUTALB-ACETAMIN-CAFF 50-325: 50-325-40 | 7 days supply | Qty: 30 | Fill #0

## 2017-08-11 MED FILL — traZODone HCL 50 MG TABS: 50 | 30 days supply | Qty: 30 | Fill #0

## 2017-08-11 NOTE — Patient Instructions (Signed)
Diabetes Mellitus and Food It is important for you to manage your blood sugar (glucose) level. Your blood glucose level can be greatly affected by what you eat. Eating healthier foods in the appropriate amounts throughout the day at about the same time each day will help you control your blood glucose level. It can also help slow or prevent worsening of your diabetes mellitus. Healthy eating may even help you improve the level of your blood pressure and reach or maintain a healthy weight. General recommendations for healthful eating and cooking habits include:  Eating meals and snacks regularly. Avoid going long periods of time without eating to lose weight.  Eating a diet that consists mainly of plant-based foods, such as fruits, vegetables, nuts, legumes, and whole grains.  Using low-heat cooking methods, such as baking, instead of high-heat cooking methods, such as deep frying.  Work with your dietitian to make sure you understand how to use the Nutrition Facts information on food labels. How can food affect me? Carbohydrates Carbohydrates affect your blood glucose level more than any other type of food. Your dietitian will help you determine how many carbohydrates to eat at each meal and teach you how to count carbohydrates. Counting carbohydrates is important to keep your blood glucose at a healthy level, especially if you are using insulin or taking certain medicines for diabetes mellitus. Alcohol Alcohol can cause sudden decreases in blood glucose (hypoglycemia), especially if you use insulin or take certain medicines for diabetes mellitus. Hypoglycemia can be a life-threatening condition. Symptoms of hypoglycemia (sleepiness, dizziness, and disorientation) are similar to symptoms of having too much alcohol. If your health care provider has given you approval to drink alcohol, do so in moderation and use the following guidelines:  Women should not have more than one drink per day, and men  should not have more than two drinks per day. One drink is equal to: ? 12 oz of beer. ? 5 oz of wine. ? 1 oz of hard liquor.  Do not drink on an empty stomach.  Keep yourself hydrated. Have water, diet soda, or unsweetened iced tea.  Regular soda, juice, and other mixers might contain a lot of carbohydrates and should be counted.  What foods are not recommended? As you make food choices, it is important to remember that all foods are not the same. Some foods have fewer nutrients per serving than other foods, even though they might have the same number of calories or carbohydrates. It is difficult to get your body what it needs when you eat foods with fewer nutrients. Examples of foods that you should avoid that are high in calories and carbohydrates but low in nutrients include:  Trans fats (most processed foods list trans fats on the Nutrition Facts label).  Regular soda.  Juice.  Candy.  Sweets, such as cake, pie, doughnuts, and cookies.  Fried foods.  What foods can I eat? Eat nutrient-rich foods, which will nourish your body and keep you healthy. The food you should eat also will depend on several factors, including:  The calories you need.  The medicines you take.  Your weight.  Your blood glucose level.  Your blood pressure level.  Your cholesterol level.  You should eat a variety of foods, including:  Protein. ? Lean cuts of meat. ? Proteins low in saturated fats, such as fish, egg whites, and beans. Avoid processed meats.  Fruits and vegetables. ? Fruits and vegetables that may help control blood glucose levels, such as apples,   mangoes, and yams.  Dairy products. ? Choose fat-free or low-fat dairy products, such as milk, yogurt, and cheese.  Grains, bread, pasta, and rice. ? Choose whole grain products, such as multigrain bread, whole oats, and brown rice. These foods may help control blood pressure.  Fats. ? Foods containing healthful fats, such as  nuts, avocado, olive oil, canola oil, and fish.  Does everyone with diabetes mellitus have the same meal plan? Because every person with diabetes mellitus is different, there is not one meal plan that works for everyone. It is very important that you meet with a dietitian who will help you create a meal plan that is just right for you. This information is not intended to replace advice given to you by your health care provider. Make sure you discuss any questions you have with your health care provider. Document Released: 08/01/2005 Document Revised: 04/11/2016 Document Reviewed: 10/01/2013 Elsevier Interactive Patient Education  2017 [employer]  

## 2017-08-12 ENCOUNTER — Ambulatory Visit: Payer: Self-pay | Attending: Family Medicine

## 2017-08-12 DIAGNOSIS — E119 Type 2 diabetes mellitus without complications: Secondary | ICD-10-CM | POA: Insufficient documentation

## 2017-08-12 DIAGNOSIS — IMO0001 Reserved for inherently not codable concepts without codable children: Secondary | ICD-10-CM

## 2017-08-12 DIAGNOSIS — Z794 Long term (current) use of insulin: Secondary | ICD-10-CM | POA: Insufficient documentation

## 2017-08-12 NOTE — Progress Notes (Signed)
Subjective:  Patient ID: LAKSHYA MCGILLICUDDY, male    DOB: 1961-07-02  Age: 56 y.o. MRN: [id number]  CC: Headache and Diabetes   HPI KRYSTOPHER KUENZEL  is a 56 year old male with a history of type 2 diabetes mellitus(A1c >15.0),hypertension, hyperlipidemia, gout, osteoarthritis of the knee who presents today for follow-up of Hypertension and diabetes mellitus.  At his last visit his NovoLog 70/30 has been increased to 35 units twice daily and he endorses compliance however blood sugar in the clinic is 412 today; 25 units of NovoLog has been administered. He informs me he tries to eat right but does not exercise much.  With regards to his hypertension he has been compliant with his medications and denies any adverse effects. He also tolerates his statins and does not have myalgias.  He complains of temporal headaches which he has had from 4 days ago and sometimes has frontal headaches. Denies postnasal drip, urinary symptoms, blurry vision, nausea or vomiting. Pain is rated at moderate.  Past Medical History:  Diagnosis Date  . Gouty bursitis, elbow  10/18/2015  . Hx of gout   . Hyperlipidemia   . Hypertension   . Knee pain, chronic   . Type II diabetes mellitus ([place])     Past Surgical History:  Procedure Laterality Date  . KNEE ARTHROSCOPY Bilateral 12/29/2015   Procedure: ARTHROSCOPY KNEE BILATERAL WITH  PARTIAL MEDIAL AND LATERAL MENISCECTOMY AND FOUR COMPARTMENT SYNOVECTOMY , CHONDRALPLASTIES, PATELLA-FEMORAL CHONDRALPLASTIES BILATERALLY;  Surgeon: Dorna Leitz, MD;  Location: [facility];  Service: Orthopedics;  Laterality: Bilateral;  . NO PAST SURGERIES      No Known Allergies   Outpatient Medications Prior to Visit  Medication Sig Dispense Refill  . aspirin 81 MG EC tablet Take 1 tablet (81 mg total) by mouth daily. 90 tablet 1  . Blood Glucose Monitoring Suppl (TRUE METRIX METER) w/Device KIT 1 Device by Does not apply route 3 (three) times daily. 1 kit 0  . cetirizine (ZYRTEC)  10 MG tablet TAKE 1 TABLET BY MOUTH DAILY. 30 tablet 1  . colchicine 0.6 MG tablet Take 0.6 mg by mouth daily.    . ferrous sulfate 325 (65 FE) MG tablet TAKE 1 TABLET BY MOUTH DAILY WITH BREAKFAST. 30 tablet 2  . glucose blood (TRUE METRIX BLOOD GLUCOSE TEST) test strip Use as instructed 3 times daily 100 each 12  . lisinopril-hydrochlorothiazide (PRINZIDE,ZESTORETIC) 20-25 MG tablet TAKE 1 TABLET BY MOUTH DAILY. 30 tablet 3  . TRUEPLUS INSULIN SYRINGE 30G X 5/16" 0.5 ML MISC USE FOR INSULIN INJECTION AS DIRECTED 100 each 2  . TRUEPLUS LANCETS 28G MISC 1 Device by Does not apply route 3 (three) times daily. 90 each 12  . amLODipine (NORVASC) 10 MG tablet TAKE 1 TABLET BY MOUTH DAILY. 30 tablet 2  . atorvastatin (LIPITOR) 40 MG tablet TAKE 1 TABLET BY MOUTH DAILY AT 6 PM. 30 tablet 2  . insulin aspart protamine- aspart (NOVOLOG MIX 70/30) (70-30) 100 UNIT/ML injection Inject 0.35 mLs (35 Units total) into the skin 2 (two) times daily with a meal. 60 mL 3  . metFORMIN (GLUCOPHAGE) 500 MG tablet Take 2 tablets (1,000 mg total) by mouth 2 (two) times daily with a meal. 120 tablet 3  . metoprolol succinate (TOPROL-XL) 50 MG 24 hr tablet Take 1 tablet (50 mg total) by mouth daily. 30 tablet 3  . traZODone (DESYREL) 50 MG tablet Take 1 tablet (50 mg total) by mouth at bedtime. 30 tablet 3   No  facility-administered medications prior to visit.     ROS Review of Systems  Constitutional: Negative for activity change and appetite change.  HENT: Negative for sinus pressure and sore throat.   Eyes: Negative for visual disturbance.  Respiratory: Negative for cough, chest tightness and shortness of breath.   Cardiovascular: Negative for chest pain and leg swelling.  Gastrointestinal: Negative for abdominal distention, abdominal pain, constipation and diarrhea.  Endocrine: Negative.   Genitourinary: Negative for dysuria.  Musculoskeletal: Negative for joint swelling and myalgias.  Skin: Negative for  rash.  Allergic/Immunologic: Negative.   Neurological: Negative for weakness, light-headedness and numbness.  Psychiatric/Behavioral: Negative for dysphoric mood and suicidal ideas.    Objective:  BP 116/71   Pulse 89   Temp 98.3 F (36.8 C) (Oral)   Ht 5' 5" (1.651 m)   Wt 251 lb 6.4 oz (114 kg)   SpO2 98%   BMI 41.84 kg/m   BP/Weight 08/11/2017 06/04/2017 1/61/0960  Systolic BP 454 098 119  Diastolic BP 71 82 83  Wt. (Lbs) 251.4 253.2 247.4  BMI 41.84 42.13 41.17      Physical Exam  Constitutional: He is oriented to person, place, and time. He appears well-developed and well-nourished.  Cardiovascular: Normal rate, normal heart sounds and intact distal pulses.   No murmur heard. Pulmonary/Chest: Effort normal and breath sounds normal. He has no wheezes. He has no rales. He exhibits no tenderness.  Abdominal: Soft. Bowel sounds are normal. He exhibits no distension and no mass. There is no tenderness.  Musculoskeletal: Normal range of motion. He exhibits no tenderness (on ROM of both knees; crepitus present bilaterally).  Neurological: He is alert and oriented to person, place, and time.  Psychiatric: He has a normal mood and affect.     Lab Results  Component Value Date   HGBA1C >15 08/11/2017    Assessment & Plan:   1. IDDM (insulin dependent diabetes mellitus) ([place]) Uncontrolled with A1c of greater than 15 ? Poor technique of insulin administration CBG of 412 and clinic, 25 units of NovoLog administered and repeat CBG after 30 minutes was 398 Increased dose of NovoLog 70/30 Demonstrated proper technique of insulin administration and patient was able to verbalize and demonstrate as well. - metoprolol succinate (TOPROL-XL) 50 MG 24 hr tablet; Take 1 tablet (50 mg total) by mouth daily.  Dispense: 30 tablet; Refill: 3 - insulin aspart protamine- aspart (NOVOLOG MIX 70/30) (70-30) 100 UNIT/ML injection; Inject 0.45 mLs (45 Units total) into the skin 2 (two) times  daily with a meal.  Dispense: 60 mL; Refill: 3 - metFORMIN (GLUCOPHAGE) 500 MG tablet; Take 2 tablets (1,000 mg total) by mouth 2 (two) times daily with a meal.  Dispense: 120 tablet; Refill: 3 - amLODipine (NORVASC) 10 MG tablet; Take 1 tablet (10 mg total) by mouth daily.  Dispense: 30 tablet; Refill: 5 - atorvastatin (LIPITOR) 40 MG tablet; TAKE 1 TABLET BY MOUTH DAILY AT 6 PM.  Dispense: 30 tablet; Refill: 5 - Comprehensive metabolic panel; Future - Lipid panel; Future - Microalbumin/Creatinine Ratio, Urine; Future - POCT glucose (manual entry) - insulin aspart (novoLOG) injection 25 Units; Inject 0.25 mLs (25 Units total) into the skin once. - POCT glucose (manual entry) - POCT glycosylated hemoglobin (Hb A1C)  2. Other insomnia Controlled - traZODone (DESYREL) 50 MG tablet; Take 1 tablet (50 mg total) by mouth at bedtime.  Dispense: 30 tablet; Refill: 3  3. Essential hypertension Controlled - metoprolol succinate (TOPROL-XL) 50 MG 24 hr tablet; Take  1 tablet (50 mg total) by mouth daily.  Dispense: 30 tablet; Refill: 3 - amLODipine (NORVASC) 10 MG tablet; Take 1 tablet (10 mg total) by mouth daily.  Dispense: 30 tablet; Refill: 5 - atorvastatin (LIPITOR) 40 MG tablet; TAKE 1 TABLET BY MOUTH DAILY AT 6 PM.  Dispense: 30 tablet; Refill: 5  4. Other headache syndrome Unknown etiology Currently on antihistamine Will treat for tension headache   5. Primary osteoarthritis of both knees OTC NSAIDs as needed  Meds ordered this encounter  Medications  . butalbital-acetaminophen-caffeine (FIORICET, ESGIC) 50-325-40 MG tablet    Sig: Take 1 tablet by mouth every 6 (six) hours as needed for headache.    Dispense:  30 tablet    Refill:  0  . traZODone (DESYREL) 50 MG tablet    Sig: Take 1 tablet (50 mg total) by mouth at bedtime.    Dispense:  30 tablet    Refill:  3  . metoprolol succinate (TOPROL-XL) 50 MG 24 hr tablet    Sig: Take 1 tablet (50 mg total) by mouth daily.     Dispense:  30 tablet    Refill:  3    Discontinue previous dose  . insulin aspart protamine- aspart (NOVOLOG MIX 70/30) (70-30) 100 UNIT/ML injection    Sig: Inject 0.45 mLs (45 Units total) into the skin 2 (two) times daily with a meal.    Dispense:  60 mL    Refill:  3    Discontinue previous dose  . metFORMIN (GLUCOPHAGE) 500 MG tablet    Sig: Take 2 tablets (1,000 mg total) by mouth 2 (two) times daily with a meal.    Dispense:  120 tablet    Refill:  3    Discontinue previous dose  . amLODipine (NORVASC) 10 MG tablet    Sig: Take 1 tablet (10 mg total) by mouth daily.    Dispense:  30 tablet    Refill:  5  . atorvastatin (LIPITOR) 40 MG tablet    Sig: TAKE 1 TABLET BY MOUTH DAILY AT 6 PM.    Dispense:  30 tablet    Refill:  5  . insulin aspart (novoLOG) injection 25 Units    Follow-up: Return in about 3 months (around 11/10/2017) for Follow-up on diabetes mellitus.   Arnoldo Morale MD

## 2017-08-12 NOTE — Progress Notes (Signed)
Patient here for lab visit only 

## 2017-08-13 LAB — COMPREHENSIVE METABOLIC PANEL
A/G RATIO: 1.5 (ref 1.2–2.2)
ALBUMIN: 4.6 g/dL (ref 3.5–5.5)
ALK PHOS: 112 IU/L (ref 39–117)
ALT: 20 IU/L (ref 0–44)
AST: 23 IU/L (ref 0–40)
BILIRUBIN TOTAL: 0.3 mg/dL (ref 0.0–1.2)
BUN / CREAT RATIO: 15 (ref 9–20)
BUN: 18 mg/dL (ref 6–24)
CHLORIDE: 98 mmol/L (ref 96–106)
CO2: 20 mmol/L (ref 20–29)
Calcium: 9.6 mg/dL (ref 8.7–10.2)
Creatinine, Ser: 1.24 mg/dL (ref 0.76–1.27)
GFR calc Af Amer: 74 mL/min/{1.73_m2} (ref >59)
GFR calc non Af Amer: 64 mL/min/{1.73_m2} (ref >59)
GLOBULIN, TOTAL: 3 g/dL (ref 1.5–4.5)
Glucose: 282 mg/dL — ABNORMAL HIGH (ref 65–99)
POTASSIUM: 4.4 mmol/L (ref 3.5–5.2)
SODIUM: 137 mmol/L (ref 134–144)
Total Protein: 7.6 g/dL (ref 6.0–8.5)

## 2017-08-13 LAB — LIPID PANEL
CHOLESTEROL TOTAL: 111 mg/dL (ref 100–199)
Chol/HDL Ratio: 3.7 ratio (ref 0.0–5.0)
HDL: 30 mg/dL — ABNORMAL LOW (ref >39)
LDL CALC: 51 mg/dL (ref 0–99)
Triglycerides: 152 mg/dL — ABNORMAL HIGH (ref 0–149)
VLDL Cholesterol Cal: 30 mg/dL (ref 5–40)

## 2017-08-13 LAB — MICROALBUMIN / CREATININE URINE RATIO
Creatinine, Urine: 85.9 mg/dL
MICROALB/CREAT RATIO: 104.4 mg/g{creat} — AB (ref 0.0–30.0)
MICROALBUM., U, RANDOM: 89.7 ug/mL

## 2017-08-14 ENCOUNTER — Telehealth: Payer: Self-pay

## 2017-08-14 NOTE — Telephone Encounter (Signed)
Pt was called and a VM was left informing pt to return phone call for lab results.

## 2017-08-14 NOTE — Telephone Encounter (Signed)
Pt was called and informed of lab results. 

## 2017-09-04 ENCOUNTER — Other Ambulatory Visit: Payer: Self-pay | Admitting: Family Medicine

## 2017-09-04 DIAGNOSIS — Z794 Long term (current) use of insulin: Principal | ICD-10-CM

## 2017-09-04 DIAGNOSIS — D509 Iron deficiency anemia, unspecified: Secondary | ICD-10-CM

## 2017-09-04 DIAGNOSIS — E119 Type 2 diabetes mellitus without complications: Principal | ICD-10-CM

## 2017-09-04 DIAGNOSIS — I10 Essential (primary) hypertension: Secondary | ICD-10-CM

## 2017-09-04 DIAGNOSIS — IMO0001 Reserved for inherently not codable concepts without codable children: Secondary | ICD-10-CM

## 2017-09-04 MED FILL — FERROUS SULFATE 325 MG TAB: 325 (65 FE) | 30 days supply | Qty: 30 | Fill #2

## 2017-09-04 MED FILL — LISINOPRIL-HCTZ 20-25 MG TA: 20-25 | 30 days supply | Qty: 30 | Fill #1

## 2017-09-04 MED FILL — METOPROLOL SUCC ER 50 MG TA: 50 | 30 days supply | Qty: 30 | Fill #3

## 2017-09-04 MED FILL — TRUEPLUS SYR 0.5ML 30GX5/16: 30G X 5/16" | 25 days supply | Qty: 100 | Fill #2

## 2017-09-04 MED FILL — !NOVOLOG MIX 70/30 VIAL: 70-30/ML | 33 days supply | Qty: 30 | Fill #0

## 2017-09-04 MED FILL — AMLODIPINE BESYLATE 10 MG T: 10 | 30 days supply | Qty: 30 | Fill #2

## 2017-09-04 MED FILL — ?ATORVASTATIN 40MG TABLET: 40 | 30 days supply | Qty: 30 | Fill #2

## 2017-09-08 ENCOUNTER — Encounter: Payer: Self-pay | Admitting: Family Medicine

## 2017-09-08 ENCOUNTER — Ambulatory Visit: Payer: Self-pay | Attending: Family Medicine | Admitting: Family Medicine

## 2017-09-08 VITALS — BP 143/90 | HR 81 | Temp 98.1°F | Ht 65.0 in | Wt 268.4 lb

## 2017-09-08 DIAGNOSIS — I10 Essential (primary) hypertension: Secondary | ICD-10-CM

## 2017-09-08 DIAGNOSIS — L603 Nail dystrophy: Secondary | ICD-10-CM

## 2017-09-08 DIAGNOSIS — M1710 Unilateral primary osteoarthritis, unspecified knee: Secondary | ICD-10-CM | POA: Insufficient documentation

## 2017-09-08 DIAGNOSIS — E119 Type 2 diabetes mellitus without complications: Secondary | ICD-10-CM

## 2017-09-08 DIAGNOSIS — M109 Gout, unspecified: Secondary | ICD-10-CM | POA: Insufficient documentation

## 2017-09-08 DIAGNOSIS — E1165 Type 2 diabetes mellitus with hyperglycemia: Secondary | ICD-10-CM | POA: Insufficient documentation

## 2017-09-08 DIAGNOSIS — IMO0001 Reserved for inherently not codable concepts without codable children: Secondary | ICD-10-CM

## 2017-09-08 DIAGNOSIS — E1149 Type 2 diabetes mellitus with other diabetic neurological complication: Secondary | ICD-10-CM

## 2017-09-08 DIAGNOSIS — Z794 Long term (current) use of insulin: Secondary | ICD-10-CM

## 2017-09-08 DIAGNOSIS — E1140 Type 2 diabetes mellitus with diabetic neuropathy, unspecified: Secondary | ICD-10-CM | POA: Insufficient documentation

## 2017-09-08 DIAGNOSIS — E785 Hyperlipidemia, unspecified: Secondary | ICD-10-CM | POA: Insufficient documentation

## 2017-09-08 DIAGNOSIS — Z79899 Other long term (current) drug therapy: Secondary | ICD-10-CM | POA: Insufficient documentation

## 2017-09-08 DIAGNOSIS — Z7982 Long term (current) use of aspirin: Secondary | ICD-10-CM | POA: Insufficient documentation

## 2017-09-08 LAB — GLUCOSE, POCT (MANUAL RESULT ENTRY): POC GLUCOSE: 164 mg/dL — AB (ref 70–99)

## 2017-09-08 MED ORDER — GABAPENTIN 300 MG PO CAPS: 300.0000 mg | ORAL_CAPSULE | Freq: Two times a day (BID) | ORAL | 3 refills | Status: DC

## 2017-09-08 MED ORDER — CETIRIZINE HCL 10 MG PO TABS: 10.0000 mg | ORAL_TABLET | Freq: Every day | ORAL | 3 refills | Status: DC

## 2017-09-08 MED FILL — ?CETIRIZINE HCL 10 MG TABLE: 10 | 30 days supply | Qty: 30 | Fill #0

## 2017-09-08 MED FILL — GABAPENTIN 300 MG CAPSULE: 300 | 30 days supply | Qty: 60 | Fill #0

## 2017-09-08 NOTE — Progress Notes (Signed)
Subjective:  Patient ID: [name], male    DOB: 1961-01-02  Age: 56 y.o. MRN: [id number]  CC: Diabetes   HPI [name]  is a 56 year old male with a history of type 2 diabetes mellitus(A1c >15.0), hypertension, hyperlipidemia, gout, osteoarthritis of the knee who presents today for follow-up of Hypertension and diabetes mellitus.  At his last visit he had received instructions on proper administration of insulin as he had had persistent hyperglycemia despite compliance with his insulin. His blood sugar today in the clinic is 164 and his blood sugars have been in the 170s at home revealing significant improvement. He is also working on a diabetic diet.  He complains of burning in his feet and associated pain which has been present for several months. He has never had a podiatry exam he states.  His blood pressure is slightly elevated but he endorses compliance with his antihypertensive.  Past Medical History:  Diagnosis Date  . Gouty bursitis, elbow  10/18/2015  . Hx of gout   . Hyperlipidemia   . Hypertension   . Knee pain, chronic   . Type II diabetes mellitus ([place])     Past Surgical History:  Procedure Laterality Date  . KNEE ARTHROSCOPY Bilateral 12/29/2015   Procedure: ARTHROSCOPY KNEE BILATERAL WITH  PARTIAL MEDIAL AND LATERAL MENISCECTOMY AND FOUR COMPARTMENT SYNOVECTOMY , CHONDRALPLASTIES, PATELLA-FEMORAL CHONDRALPLASTIES BILATERALLY;  Surgeon: Dorna Leitz, MD;  Location: [facility];  Service: Orthopedics;  Laterality: Bilateral;  . NO PAST SURGERIES      No Known Allergies    Outpatient Medications Prior to Visit  Medication Sig Dispense Refill  . amLODipine (NORVASC) 10 MG tablet Take 1 tablet (10 mg total) by mouth daily. 30 tablet 5  . aspirin 81 MG EC tablet Take 1 tablet (81 mg total) by mouth daily. 90 tablet 1  . atorvastatin (LIPITOR) 40 MG tablet TAKE 1 TABLET BY MOUTH DAILY AT 6 PM. 30 tablet 5  . Blood Glucose Monitoring Suppl (TRUE METRIX  METER) w/Device KIT 1 Device by Does not apply route 3 (three) times daily. 1 kit 0  . butalbital-acetaminophen-caffeine (FIORICET, ESGIC) 50-325-40 MG tablet Take 1 tablet by mouth every 6 (six) hours as needed for headache. 30 tablet 0  . colchicine 0.6 MG tablet Take 0.6 mg by mouth daily.    . ferrous sulfate 325 (65 FE) MG tablet TAKE 1 TABLET BY MOUTH DAILY WITH BREAKFAST. 30 tablet 2  . glucose blood (TRUE METRIX BLOOD GLUCOSE TEST) test strip Use as instructed 3 times daily 100 each 12  . insulin aspart protamine- aspart (NOVOLOG MIX 70/30) (70-30) 100 UNIT/ML injection Inject 0.45 mLs (45 Units total) into the skin 2 (two) times daily with a meal. 60 mL 3  . lisinopril-hydrochlorothiazide (PRINZIDE,ZESTORETIC) 20-25 MG tablet TAKE 1 TABLET BY MOUTH DAILY. 30 tablet 3  . metFORMIN (GLUCOPHAGE) 500 MG tablet Take 2 tablets (1,000 mg total) by mouth 2 (two) times daily with a meal. 120 tablet 3  . metoprolol succinate (TOPROL-XL) 50 MG 24 hr tablet Take 1 tablet (50 mg total) by mouth daily. 30 tablet 3  . traZODone (DESYREL) 50 MG tablet Take 1 tablet (50 mg total) by mouth at bedtime. 30 tablet 3  . TRUEPLUS INSULIN SYRINGE 30G X 5/16" 0.5 ML MISC USE FOR INSULIN INJECTION AS DIRECTED 100 each 2  . TRUEPLUS LANCETS 28G MISC 1 Device by Does not apply route 3 (three) times daily. 90 each 12  . cetirizine (ZYRTEC) 10 MG  tablet TAKE 1 TABLET BY MOUTH DAILY. 30 tablet 1   No facility-administered medications prior to visit.     ROS Review of Systems  Constitutional: Negative for activity change and appetite change.  HENT: Negative for sinus pressure and sore throat.   Eyes: Negative for visual disturbance.  Respiratory: Negative for cough, chest tightness and shortness of breath.   Cardiovascular: Negative for chest pain and leg swelling.  Gastrointestinal: Negative for abdominal distention, abdominal pain, constipation and diarrhea.  Endocrine: Negative.   Genitourinary: Negative for  dysuria.  Musculoskeletal: Negative for joint swelling and myalgias.  Skin: Negative for rash.  Allergic/Immunologic: Negative.   Neurological: Positive for numbness. Negative for weakness and light-headedness.  Psychiatric/Behavioral: Negative for dysphoric mood and suicidal ideas.    Objective:  BP (!) 143/90   Pulse 81   Temp 98.1 F (36.7 C) (Oral)   Ht 5' 5" (1.651 m)   Wt 268 lb 6.4 oz (121.7 kg)   SpO2 99%   BMI 44.66 kg/m   BP/Weight 09/08/2017 08/11/2017 3/72/9426  Systolic BP 270 048 498  Diastolic BP 90 71 82  Wt. (Lbs) 268.4 251.4 253.2  BMI 44.66 41.84 42.13      Physical Exam  Constitutional: He is oriented to person, place, and time. He appears well-developed and well-nourished.  Cardiovascular: Normal rate, normal heart sounds and intact distal pulses.   No murmur heard. Pulmonary/Chest: Effort normal and breath sounds normal. He has no wheezes. He has no rales. He exhibits no tenderness.  Abdominal: Soft. Bowel sounds are normal. He exhibits no distension and no mass. There is no tenderness.  Musculoskeletal: Normal range of motion. He exhibits edema (1+ bilateral nonpitting edema of both dorsum). He exhibits no tenderness.  Crepitus on range of motion of both knees; no tenderness  Neurological: He is alert and oriented to person, place, and time.  Hyperesthesia of both feet.  Skin:  Lichenified skin on feet dystrophic toenails.    Lab Results  Component Value Date   HGBA1C >15 08/11/2017     Assessment & Plan:   1. IDDM (insulin dependent diabetes mellitus) ([place]) Uncontrolled with A1c of greater than 15 Blood sugar logs revealed improvement No regimen change - POCT glucose (manual entry)  2. Essential hypertension Slightly elevated above goal of less than 130/80 Low-sodium, DASH diet No regimen change today.  3. Dystrophic nail Discussed diabetic foot care - Ambulatory referral to Podiatry  4. Other diabetic neurological complication  associated with type 2 diabetes mellitus ([place]) Uncontrolled Poorly controlled diabetes could be contributory - Ambulatory referral to Podiatry - gabapentin (NEURONTIN) 300 MG capsule; Take 1 capsule (300 mg total) by mouth 2 (two) times daily.  Dispense: 60 capsule; Refill: 3   Meds ordered this encounter  Medications  . cetirizine (ZYRTEC) 10 MG tablet    Sig: Take 1 tablet (10 mg total) by mouth daily.    Dispense:  30 tablet    Refill:  3  . gabapentin (NEURONTIN) 300 MG capsule    Sig: Take 1 capsule (300 mg total) by mouth 2 (two) times daily.    Dispense:  60 capsule    Refill:  3    Follow-up: Return in about 3 months (around 12/09/2017) for follow up on Diabetes.   Arnoldo Morale MD

## 2017-09-08 NOTE — Patient Instructions (Signed)
Diabetes Mellitus and Food It is important for you to manage your blood sugar (glucose) level. Your blood glucose level can be greatly affected by what you eat. Eating healthier foods in the appropriate amounts throughout the day at about the same time each day will help you control your blood glucose level. It can also help slow or prevent worsening of your diabetes mellitus. Healthy eating may even help you improve the level of your blood pressure and reach or maintain a healthy weight. General recommendations for healthful eating and cooking habits include:  Eating meals and snacks regularly. Avoid going long periods of time without eating to lose weight.  Eating a diet that consists mainly of plant-based foods, such as fruits, vegetables, nuts, legumes, and whole grains.  Using low-heat cooking methods, such as baking, instead of high-heat cooking methods, such as deep frying.  Work with your dietitian to make sure you understand how to use the Nutrition Facts information on food labels. How can food affect me? Carbohydrates Carbohydrates affect your blood glucose level more than any other type of food. Your dietitian will help you determine how many carbohydrates to eat at each meal and teach you how to count carbohydrates. Counting carbohydrates is important to keep your blood glucose at a healthy level, especially if you are using insulin or taking certain medicines for diabetes mellitus. Alcohol Alcohol can cause sudden decreases in blood glucose (hypoglycemia), especially if you use insulin or take certain medicines for diabetes mellitus. Hypoglycemia can be a life-threatening condition. Symptoms of hypoglycemia (sleepiness, dizziness, and disorientation) are similar to symptoms of having too much alcohol. If your health care provider has given you approval to drink alcohol, do so in moderation and use the following guidelines:  Women should not have more than one drink per day, and men  should not have more than two drinks per day. One drink is equal to: ? 12 oz of beer. ? 5 oz of wine. ? 1 oz of hard liquor.  Do not drink on an empty stomach.  Keep yourself hydrated. Have water, diet soda, or unsweetened iced tea.  Regular soda, juice, and other mixers might contain a lot of carbohydrates and should be counted.  What foods are not recommended? As you make food choices, it is important to remember that all foods are not the same. Some foods have fewer nutrients per serving than other foods, even though they might have the same number of calories or carbohydrates. It is difficult to get your body what it needs when you eat foods with fewer nutrients. Examples of foods that you should avoid that are high in calories and carbohydrates but low in nutrients include:  Trans fats (most processed foods list trans fats on the Nutrition Facts label).  Regular soda.  Juice.  Candy.  Sweets, such as cake, pie, doughnuts, and cookies.  Fried foods.  What foods can I eat? Eat nutrient-rich foods, which will nourish your body and keep you healthy. The food you should eat also will depend on several factors, including:  The calories you need.  The medicines you take.  Your weight.  Your blood glucose level.  Your blood pressure level.  Your cholesterol level.  You should eat a variety of foods, including:  Protein. ? Lean cuts of meat. ? Proteins low in saturated fats, such as fish, egg whites, and beans. Avoid processed meats.  Fruits and vegetables. ? Fruits and vegetables that may help control blood glucose levels, such as apples,   mangoes, and yams.  Dairy products. ? Choose fat-free or low-fat dairy products, such as milk, yogurt, and cheese.  Grains, bread, pasta, and rice. ? Choose whole grain products, such as multigrain bread, whole oats, and brown rice. These foods may help control blood pressure.  Fats. ? Foods containing healthful fats, such as  nuts, avocado, olive oil, canola oil, and fish.  Does everyone with diabetes mellitus have the same meal plan? Because every person with diabetes mellitus is different, there is not one meal plan that works for everyone. It is very important that you meet with a dietitian who will help you create a meal plan that is just right for you. This information is not intended to replace advice given to you by your health care provider. Make sure you discuss any questions you have with your health care provider. Document Released: 08/01/2005 Document Revised: 04/11/2016 Document Reviewed: 10/01/2013 Elsevier Interactive Patient Education  2017 [employer]  

## 2017-10-06 ENCOUNTER — Other Ambulatory Visit: Payer: Self-pay | Admitting: Family Medicine

## 2017-10-06 DIAGNOSIS — D509 Iron deficiency anemia, unspecified: Secondary | ICD-10-CM

## 2017-10-06 MED FILL — ?ATORVASTATIN 40MG TABLET: 40 | 30 days supply | Qty: 30 | Fill #0

## 2017-10-06 MED FILL — AMLODIPINE BESYLATE 10 MG T: 10 | 30 days supply | Qty: 30 | Fill #0

## 2017-10-06 MED FILL — ?METFORMIN HCL 500MG TABLET: 500 | 30 days supply | Qty: 120 | Fill #2

## 2017-10-06 MED FILL — FERROUS SULFATE 325 MG TAB: 325 (65 FE) | 30 days supply | Qty: 30 | Fill #0

## 2017-10-06 MED FILL — METOPROLOL SUCC ER 50 MG TA: 50 | 30 days supply | Qty: 30 | Fill #0

## 2017-10-06 MED FILL — ?CETIRIZINE HCL 10 MG TABLE: 10 | 30 days supply | Qty: 30 | Fill #1

## 2017-10-06 MED FILL — LISINOPRIL-HCTZ 20-25 MG TA: 20-25 | 30 days supply | Qty: 30 | Fill #2

## 2017-10-06 MED FILL — !NOVOLOG MIX 70/30 VIAL: 70-30/ML | 33 days supply | Qty: 30 | Fill #1

## 2017-10-06 MED FILL — GABAPENTIN 300 MG CAPSULE: 300 | 30 days supply | Qty: 60 | Fill #1

## 2017-11-06 MED FILL — TRUE METRIX TEST STRIP: 33 days supply | Qty: 100 | Fill #3

## 2017-11-06 MED FILL — METOPROLOL SUCC ER 50 MG TA: 50 | 30 days supply | Qty: 30 | Fill #1

## 2017-11-06 MED FILL — LISINOPRIL-HCTZ 20-25 MG TA: 20-25 | 30 days supply | Qty: 30 | Fill #3

## 2017-11-06 MED FILL — AMLODIPINE BESYLATE 10 MG T: 10 | 30 days supply | Qty: 30 | Fill #1

## 2017-11-06 MED FILL — GABAPENTIN 300 MG CAPSULE: 300 | 30 days supply | Qty: 60 | Fill #2

## 2017-11-06 MED FILL — FERROUS SULFATE 325 MG TAB: 325 (65 FE) | 30 days supply | Qty: 30 | Fill #1

## 2017-11-06 MED FILL — !NOVOLOG MIX 70/30 VIAL: 70-30/ML | 33 days supply | Qty: 30 | Fill #2

## 2017-11-06 MED FILL — ?ATORVASTATIN 40MG TABLET: 40 | 30 days supply | Qty: 30 | Fill #1

## 2017-11-06 MED FILL — ?CETIRIZINE HCL 10 MG TABLE: 10 | 30 days supply | Qty: 30 | Fill #2

## 2017-11-12 ENCOUNTER — Ambulatory Visit: Payer: Self-pay | Admitting: Family Medicine

## 2017-11-25 ENCOUNTER — Other Ambulatory Visit: Payer: Self-pay | Admitting: Family Medicine

## 2017-11-25 DIAGNOSIS — E119 Type 2 diabetes mellitus without complications: Principal | ICD-10-CM

## 2017-11-25 DIAGNOSIS — IMO0001 Reserved for inherently not codable concepts without codable children: Secondary | ICD-10-CM

## 2017-11-25 DIAGNOSIS — Z794 Long term (current) use of insulin: Principal | ICD-10-CM

## 2017-12-01 ENCOUNTER — Encounter: Payer: Self-pay | Admitting: Family Medicine

## 2017-12-01 ENCOUNTER — Ambulatory Visit: Payer: Self-pay | Attending: Family Medicine | Admitting: Family Medicine

## 2017-12-01 VITALS — BP 130/81 | HR 83 | Temp 97.9°F | Ht 65.0 in | Wt 268.0 lb

## 2017-12-01 DIAGNOSIS — Z9889 Other specified postprocedural states: Secondary | ICD-10-CM | POA: Insufficient documentation

## 2017-12-01 DIAGNOSIS — Z7982 Long term (current) use of aspirin: Secondary | ICD-10-CM | POA: Insufficient documentation

## 2017-12-01 DIAGNOSIS — E1169 Type 2 diabetes mellitus with other specified complication: Secondary | ICD-10-CM

## 2017-12-01 DIAGNOSIS — Z794 Long term (current) use of insulin: Secondary | ICD-10-CM | POA: Insufficient documentation

## 2017-12-01 DIAGNOSIS — Z79899 Other long term (current) drug therapy: Secondary | ICD-10-CM | POA: Insufficient documentation

## 2017-12-01 DIAGNOSIS — E119 Type 2 diabetes mellitus without complications: Secondary | ICD-10-CM

## 2017-12-01 DIAGNOSIS — G4700 Insomnia, unspecified: Secondary | ICD-10-CM | POA: Insufficient documentation

## 2017-12-01 DIAGNOSIS — L603 Nail dystrophy: Secondary | ICD-10-CM

## 2017-12-01 DIAGNOSIS — E1142 Type 2 diabetes mellitus with diabetic polyneuropathy: Secondary | ICD-10-CM | POA: Insufficient documentation

## 2017-12-01 DIAGNOSIS — G4709 Other insomnia: Secondary | ICD-10-CM

## 2017-12-01 DIAGNOSIS — L03115 Cellulitis of right lower limb: Secondary | ICD-10-CM

## 2017-12-01 DIAGNOSIS — I10 Essential (primary) hypertension: Secondary | ICD-10-CM

## 2017-12-01 DIAGNOSIS — E785 Hyperlipidemia, unspecified: Secondary | ICD-10-CM | POA: Insufficient documentation

## 2017-12-01 DIAGNOSIS — L03116 Cellulitis of left lower limb: Secondary | ICD-10-CM | POA: Insufficient documentation

## 2017-12-01 DIAGNOSIS — E1149 Type 2 diabetes mellitus with other diabetic neurological complication: Secondary | ICD-10-CM

## 2017-12-01 DIAGNOSIS — M109 Gout, unspecified: Secondary | ICD-10-CM | POA: Insufficient documentation

## 2017-12-01 LAB — POCT GLYCOSYLATED HEMOGLOBIN (HGB A1C): Hemoglobin A1C: 12.9

## 2017-12-01 LAB — GLUCOSE, POCT (MANUAL RESULT ENTRY): POC GLUCOSE: 196 mg/dL — AB (ref 70–99)

## 2017-12-01 MED ORDER — LISINOPRIL-HYDROCHLOROTHIAZIDE 20-25 MG PO TABS: 1.0000 | ORAL_TABLET | Freq: Every day | ORAL | 3 refills | Status: DC

## 2017-12-01 MED ORDER — CETIRIZINE HCL 10 MG PO TABS: 10.0000 mg | ORAL_TABLET | Freq: Every day | ORAL | 3 refills | Status: DC

## 2017-12-01 MED ORDER — GABAPENTIN 300 MG PO CAPS: 300.0000 mg | ORAL_CAPSULE | Freq: Two times a day (BID) | ORAL | 3 refills | Status: DC

## 2017-12-01 MED ORDER — TRAZODONE HCL 50 MG PO TABS: 50.0000 mg | ORAL_TABLET | Freq: Every day | ORAL | 3 refills | Status: DC

## 2017-12-01 MED ORDER — AMLODIPINE BESYLATE 10 MG PO TABS: 10.0000 mg | ORAL_TABLET | Freq: Every day | ORAL | 5 refills | Status: DC

## 2017-12-01 MED ORDER — ASPIRIN 81 MG PO TBEC: 81.0000 mg | DELAYED_RELEASE_TABLET | Freq: Every day | ORAL | 1 refills | Status: DC

## 2017-12-01 MED ORDER — METOPROLOL SUCCINATE ER 50 MG PO TB24: 50.0000 mg | ORAL_TABLET | Freq: Every day | ORAL | 3 refills | Status: DC

## 2017-12-01 MED ORDER — ATORVASTATIN CALCIUM 40 MG PO TABS: ORAL_TABLET | ORAL | 5 refills | Status: DC

## 2017-12-01 MED ORDER — INSULIN ASPART PROT & ASPART (70-30 MIX) 100 UNIT/ML ~~LOC~~ SUSP: 50.0000 [IU] | Freq: Two times a day (BID) | SUBCUTANEOUS | 3 refills | Status: DC

## 2017-12-01 MED ORDER — METFORMIN HCL 500 MG PO TABS: 1000.0000 mg | ORAL_TABLET | Freq: Two times a day (BID) | ORAL | 3 refills | Status: DC

## 2017-12-01 MED ORDER — CEPHALEXIN 500 MG PO CAPS
500.0000 mg | ORAL_CAPSULE | Freq: Four times a day (QID) | ORAL | 0 refills | Status: DC
Start: 2017-12-01 — End: 2018-03-23

## 2017-12-01 MED ORDER — FUROSEMIDE 20 MG PO TABS: 20.0000 mg | ORAL_TABLET | Freq: Every day | ORAL | 0 refills | Status: DC

## 2017-12-01 MED FILL — !NOVOLOG MIX 70/30 VIAL: 70-30/ML | 30 days supply | Qty: 30 | Fill #0

## 2017-12-01 MED FILL — CEPHALEXIN 500 MG CAPSULE: 500 | 10 days supply | Qty: 40 | Fill #0

## 2017-12-01 MED FILL — ?FUROSEMIDE 20 MG TABLET: 20 | 20 days supply | Qty: 20 | Fill #0

## 2017-12-01 MED FILL — ?METFORMIN HCL 500MG TABLET: 500 | 30 days supply | Qty: 120 | Fill #0

## 2017-12-01 MED FILL — traZODone HCL 50 MG TABS: 50 | 30 days supply | Qty: 30 | Fill #2

## 2017-12-01 NOTE — Progress Notes (Signed)
Subjective:  Patient ID: [name], male    DOB: 01/30/1961  Age: 57 y.o. MRN: [id number]  CC: Diabetes   HPI [name]  is a 57 year old male with a history of type 2 diabetes mellitus(A1c 12.9), hypertension, hyperlipidemia, gout, osteoarthritis of the knee who presents today for follow-up of Hypertension and diabetes mellitus.  His A1c is 12.9 which has improved from greater than 15 previously and he endorses cutting out sweets, avoiding sodas and drinking water and is concerned that his diabetes is still uncontrolled despite his compliant with medications as well.. I have reviewed his blood sugar log from his meter which reveals random sugars in the 200-300 range. His neuropathy is controlled on gabapentin.  He has been compliant with his antihypertensive and tolerates his statin with no complaints of adverse effects. He complains of bilateral lower extremity swelling and pain and is unable to give me an exact duration of symptoms.  He denies fever or recent trauma.  Past Medical History:  Diagnosis Date  . Gouty bursitis, elbow  10/18/2015  . Hx of gout   . Hyperlipidemia   . Hypertension   . Knee pain, chronic   . Type II diabetes mellitus ([place])     Past Surgical History:  Procedure Laterality Date  . KNEE ARTHROSCOPY Bilateral 12/29/2015   Procedure: ARTHROSCOPY KNEE BILATERAL WITH  PARTIAL MEDIAL AND LATERAL MENISCECTOMY AND FOUR COMPARTMENT SYNOVECTOMY , CHONDRALPLASTIES, PATELLA-FEMORAL CHONDRALPLASTIES BILATERALLY;  Surgeon: Dorna Leitz, MD;  Location: [facility];  Service: Orthopedics;  Laterality: Bilateral;  . NO PAST SURGERIES      No Known Allergies   Outpatient Medications Prior to Visit  Medication Sig Dispense Refill  . Blood Glucose Monitoring Suppl (TRUE METRIX METER) w/Device KIT 1 Device by Does not apply route 3 (three) times daily. 1 kit 0  . butalbital-acetaminophen-caffeine (FIORICET, ESGIC) 50-325-40 MG tablet Take 1 tablet by mouth every 6  (six) hours as needed for headache. 30 tablet 0  . colchicine 0.6 MG tablet Take 0.6 mg by mouth daily.    . FEROSUL 325 (65 Fe) MG tablet TAKE 1 TABLET BY MOUTH DAILY WITH BREAKFAST. 30 tablet 2  . glucose blood (TRUE METRIX BLOOD GLUCOSE TEST) test strip Use as instructed 3 times daily 100 each 12  . TRUEPLUS INSULIN SYRINGE 30G X 5/16" 0.5 ML MISC USE FOR INSULIN INJECTION AS DIRECTED 100 each 2  . TRUEPLUS LANCETS 28G MISC 1 Device by Does not apply route 3 (three) times daily. 90 each 12  . amLODipine (NORVASC) 10 MG tablet Take 1 tablet (10 mg total) by mouth daily. 30 tablet 5  . aspirin 81 MG EC tablet Take 1 tablet (81 mg total) by mouth daily. 90 tablet 1  . atorvastatin (LIPITOR) 40 MG tablet TAKE 1 TABLET BY MOUTH DAILY AT 6 PM. 30 tablet 5  . cetirizine (ZYRTEC) 10 MG tablet Take 1 tablet (10 mg total) by mouth daily. 30 tablet 3  . gabapentin (NEURONTIN) 300 MG capsule Take 1 capsule (300 mg total) by mouth 2 (two) times daily. 60 capsule 3  . insulin aspart protamine- aspart (NOVOLOG MIX 70/30) (70-30) 100 UNIT/ML injection Inject 0.45 mLs (45 Units total) into the skin 2 (two) times daily with a meal. 60 mL 3  . lisinopril-hydrochlorothiazide (PRINZIDE,ZESTORETIC) 20-25 MG tablet TAKE 1 TABLET BY MOUTH DAILY. 30 tablet 3  . metFORMIN (GLUCOPHAGE) 500 MG tablet Take 2 tablets (1,000 mg total) by mouth 2 (two) times daily with a meal.  120 tablet 3  . metoprolol succinate (TOPROL-XL) 50 MG 24 hr tablet Take 1 tablet (50 mg total) by mouth daily. 30 tablet 3  . traZODone (DESYREL) 50 MG tablet Take 1 tablet (50 mg total) by mouth at bedtime. 30 tablet 3   No facility-administered medications prior to visit.     ROS Review of Systems  Constitutional: Negative for activity change and appetite change.  HENT: Negative for sinus pressure and sore throat.   Eyes: Negative for visual disturbance.  Respiratory: Negative for cough, chest tightness and shortness of breath.     Cardiovascular: Positive for leg swelling. Negative for chest pain.  Gastrointestinal: Negative for abdominal distention, abdominal pain, constipation and diarrhea.  Endocrine: Negative.   Genitourinary: Negative for dysuria.  Musculoskeletal:       See hpi  Skin: Negative for rash.  Allergic/Immunologic: Negative.   Neurological: Negative for weakness, light-headedness and numbness.  Psychiatric/Behavioral: Negative for dysphoric mood and suicidal ideas.    Objective:  BP 130/81   Pulse 83   Temp 97.9 F (36.6 C) (Oral)   Ht '5\' 5"'  (1.651 m)   Wt 268 lb (121.6 kg)   SpO2 99%   BMI 44.60 kg/m   BP/Weight 12/01/2017 09/08/2017 9/79/8921  Systolic BP 194 174 081  Diastolic BP 81 90 71  Wt. (Lbs) 268 268.4 251.4  BMI 44.6 44.66 41.84      Physical Exam  Constitutional: He is oriented to person, place, and time. He appears well-developed and well-nourished.  Cardiovascular: Normal rate and normal heart sounds.  No murmur heard. Able to palpate dorsalis pedis due to pedal edema  Pulmonary/Chest: Effort normal and breath sounds normal. He has no wheezes. He has no rales. He exhibits no tenderness.  Abdominal: Soft. Bowel sounds are normal. He exhibits no distension and no mass. There is no tenderness.  Musculoskeletal: He exhibits edema (3+ bilateral lower extremity edema up to the knees with associated erthema and warts on left calf) and tenderness (tenderness to palpation of boh legs).  Neurological: He is alert and oriented to person, place, and time.  Skin: Skin is warm and dry.  Psychiatric: He has a normal mood and affect.    CMP Latest Ref Rng & Units 08/12/2017 10/18/2016 10/12/2016  Glucose 65 - 99 mg/dL 282(H) 79 92  BUN 6 - 24 mg/dL '18 9 15  ' Creatinine 0.76 - 1.27 mg/dL 1.24 0.95 0.99  Sodium 134 - 144 mmol/L 137 141 138  Potassium 3.5 - 5.2 mmol/L 4.4 3.5 3.9  Chloride 96 - 106 mmol/L 98 105 103  CO2 20 - 29 mmol/L '20 26 26  ' Calcium 8.7 - 10.2 mg/dL 9.6 9.6  9.4  Total Protein 6.0 - 8.5 g/dL 7.6 8.2(H) -  Total Bilirubin 0.0 - 1.2 mg/dL 0.3 0.6 -  Alkaline Phos 39 - 117 IU/L 112 77 -  AST 0 - 40 IU/L 23 14 -  ALT 0 - 44 IU/L 20 11 -    Lipid Panel     Component Value Date/Time   CHOL 111 08/12/2017 0900   TRIG 152 (H) 08/12/2017 0900   HDL 30 (L) 08/12/2017 0900   CHOLHDL 3.7 08/12/2017 0900   CHOLHDL 4.5 10/10/2016 1125   VLDL 23 10/10/2016 1125   LDLCALC 51 08/12/2017 0900    Lab Results  Component Value Date   HGBA1C 12.9 12/01/2017     Assessment & Plan:   1. Type 2 diabetes mellitus with other specified complication, with long-term current  use of insulin (HCC) Uncontrolled with A1c of 12.9 which has improved from greater than 15 previously Increase dose of NovoLog 70/30 Encouraged to continue with diabetic diet and lifestyle modifications as tolerated - POCT glucose (manual entry) - POCT glycosylated hemoglobin (Hb A1C) - metFORMIN (GLUCOPHAGE) 500 MG tablet; Take 2 tablets (1,000 mg total) by mouth 2 (two) times daily with a meal.  Dispense: 120 tablet; Refill: 3 - insulin aspart protamine- aspart (NOVOLOG MIX 70/30) (70-30) 100 UNIT/ML injection; Inject 0.5 mLs (50 Units total) into the skin 2 (two) times daily with a meal.  Dispense: 60 mL; Refill: 3 - atorvastatin (LIPITOR) 40 MG tablet; TAKE 1 TABLET BY MOUTH DAILY AT 6 PM.  Dispense: 30 tablet; Refill: 5 - amLODipine (NORVASC) 10 MG tablet; Take 1 tablet (10 mg total) by mouth daily.  Dispense: 30 tablet; Refill: 5 - aspirin 81 MG EC tablet; Take 1 tablet (81 mg total) by mouth daily.  Dispense: 90 tablet; Refill: 1 - metoprolol succinate (TOPROL-XL) 50 MG 24 hr tablet; Take 1 tablet (50 mg total) by mouth daily.  Dispense: 30 tablet; Refill: 3  2. Other diabetic neurological complication associated with type 2 diabetes mellitus (HCC) Controlled - gabapentin (NEURONTIN) 300 MG capsule; Take 1 capsule (300 mg total) by mouth 2 (two) times daily.  Dispense: 60 capsule;  Refill: 3 - Ambulatory referral to Podiatry  3. Essential hypertension Controlled - atorvastatin (LIPITOR) 40 MG tablet; TAKE 1 TABLET BY MOUTH DAILY AT 6 PM.  Dispense: 30 tablet; Refill: 5 - amLODipine (NORVASC) 10 MG tablet; Take 1 tablet (10 mg total) by mouth daily.  Dispense: 30 tablet; Refill: 5 - aspirin 81 MG EC tablet; Take 1 tablet (81 mg total) by mouth daily.  Dispense: 90 tablet; Refill: 1 - lisinopril-hydrochlorothiazide (PRINZIDE,ZESTORETIC) 20-25 MG tablet; Take 1 tablet by mouth daily.  Dispense: 30 tablet; Refill: 3 - metoprolol succinate (TOPROL-XL) 50 MG 24 hr tablet; Take 1 tablet (50 mg total) by mouth daily.  Dispense: 30 tablet; Refill: 3  4. Other insomnia Controlled - traZODone (DESYREL) 50 MG tablet; Take 1 tablet (50 mg total) by mouth at bedtime.  Dispense: 30 tablet; Refill: 3  5. Bilateral cellulitis of lower leg Currently with bilateral lower extremity cellulitis superimposed on chronic venous insufficiency Advised to obtain compression stockings once acute episode resolves. - cephALEXin (KEFLEX) 500 MG capsule; Take 1 capsule (500 mg total) by mouth 4 (four) times daily.  Dispense: 40 capsule; Refill: 0 - furosemide (LASIX) 20 MG tablet; Take 1 tablet (20 mg total) by mouth daily.  Dispense: 20 tablet; Refill: 0  6. Dystrophic nail - Ambulatory referral to Podiatry   Meds ordered this encounter  Medications  . metFORMIN (GLUCOPHAGE) 500 MG tablet    Sig: Take 2 tablets (1,000 mg total) by mouth 2 (two) times daily with a meal.    Dispense:  120 tablet    Refill:  3    Discontinue previous dose  . insulin aspart protamine- aspart (NOVOLOG MIX 70/30) (70-30) 100 UNIT/ML injection    Sig: Inject 0.5 mLs (50 Units total) into the skin 2 (two) times daily with a meal.    Dispense:  60 mL    Refill:  3    Discontinue previous dose  . gabapentin (NEURONTIN) 300 MG capsule    Sig: Take 1 capsule (300 mg total) by mouth 2 (two) times daily.    Dispense:   60 capsule    Refill:  3  . atorvastatin (LIPITOR)  40 MG tablet    Sig: TAKE 1 TABLET BY MOUTH DAILY AT 6 PM.    Dispense:  30 tablet    Refill:  5  . amLODipine (NORVASC) 10 MG tablet    Sig: Take 1 tablet (10 mg total) by mouth daily.    Dispense:  30 tablet    Refill:  5  . aspirin 81 MG EC tablet    Sig: Take 1 tablet (81 mg total) by mouth daily.    Dispense:  90 tablet    Refill:  1  . cetirizine (ZYRTEC) 10 MG tablet    Sig: Take 1 tablet (10 mg total) by mouth daily.    Dispense:  30 tablet    Refill:  3  . lisinopril-hydrochlorothiazide (PRINZIDE,ZESTORETIC) 20-25 MG tablet    Sig: Take 1 tablet by mouth daily.    Dispense:  30 tablet    Refill:  3  . metoprolol succinate (TOPROL-XL) 50 MG 24 hr tablet    Sig: Take 1 tablet (50 mg total) by mouth daily.    Dispense:  30 tablet    Refill:  3    Discontinue previous dose  . traZODone (DESYREL) 50 MG tablet    Sig: Take 1 tablet (50 mg total) by mouth at bedtime.    Dispense:  30 tablet    Refill:  3  . cephALEXin (KEFLEX) 500 MG capsule    Sig: Take 1 capsule (500 mg total) by mouth 4 (four) times daily.    Dispense:  40 capsule    Refill:  0  . furosemide (LASIX) 20 MG tablet    Sig: Take 1 tablet (20 mg total) by mouth daily.    Dispense:  20 tablet    Refill:  0    Follow-up: Return in about 3 weeks (around 12/22/2017) for Follow-up of lower extremity cellulitis.   Arnoldo Morale MD

## 2017-12-01 NOTE — Patient Instructions (Signed)

## 2017-12-03 MED FILL — METOPROLOL SUCCINATE ER 50: 50 | 30 days supply | Qty: 30 | Fill #2

## 2017-12-03 MED FILL — AMLODIPINE BESYLATE 10 MG T: 10 | 30 days supply | Qty: 30 | Fill #2

## 2017-12-03 MED FILL — GABAPENTIN 300 MG CAPSULE: 300 | 30 days supply | Qty: 60 | Fill #3

## 2017-12-03 MED FILL — FERROUS SULFATE 325 MG TAB: 325 (65 FE) | 30 days supply | Qty: 30 | Fill #2

## 2017-12-03 MED FILL — ?ATORVASTATIN 40MG TABLET: 40 | 30 days supply | Qty: 30 | Fill #2

## 2017-12-03 MED FILL — LISINOPRIL-HCTZ 20-25 MG TA: 20-25 | 30 days supply | Qty: 30 | Fill #0

## 2017-12-03 MED FILL — ?CETIRIZINE HCL 10 MG TABLE: 10 | 30 days supply | Qty: 30 | Fill #3

## 2017-12-05 ENCOUNTER — Ambulatory Visit: Payer: Self-pay | Attending: Family Medicine

## 2017-12-22 ENCOUNTER — Encounter: Payer: Self-pay | Admitting: Family Medicine

## 2017-12-22 ENCOUNTER — Ambulatory Visit: Payer: Self-pay | Attending: Family Medicine | Admitting: Family Medicine

## 2017-12-22 VITALS — BP 122/78 | HR 92 | Temp 97.5°F | Ht 65.0 in | Wt 273.0 lb

## 2017-12-22 DIAGNOSIS — R109 Unspecified abdominal pain: Secondary | ICD-10-CM | POA: Insufficient documentation

## 2017-12-22 DIAGNOSIS — E119 Type 2 diabetes mellitus without complications: Secondary | ICD-10-CM

## 2017-12-22 DIAGNOSIS — R059 Cough, unspecified: Secondary | ICD-10-CM

## 2017-12-22 DIAGNOSIS — Z7982 Long term (current) use of aspirin: Secondary | ICD-10-CM | POA: Insufficient documentation

## 2017-12-22 DIAGNOSIS — Z794 Long term (current) use of insulin: Secondary | ICD-10-CM | POA: Insufficient documentation

## 2017-12-22 DIAGNOSIS — I10 Essential (primary) hypertension: Secondary | ICD-10-CM | POA: Insufficient documentation

## 2017-12-22 DIAGNOSIS — E785 Hyperlipidemia, unspecified: Secondary | ICD-10-CM | POA: Insufficient documentation

## 2017-12-22 DIAGNOSIS — E11649 Type 2 diabetes mellitus with hypoglycemia without coma: Secondary | ICD-10-CM | POA: Insufficient documentation

## 2017-12-22 DIAGNOSIS — Z79899 Other long term (current) drug therapy: Secondary | ICD-10-CM | POA: Insufficient documentation

## 2017-12-22 DIAGNOSIS — M109 Gout, unspecified: Secondary | ICD-10-CM | POA: Insufficient documentation

## 2017-12-22 DIAGNOSIS — M7918 Myalgia, other site: Secondary | ICD-10-CM | POA: Insufficient documentation

## 2017-12-22 DIAGNOSIS — E1165 Type 2 diabetes mellitus with hyperglycemia: Secondary | ICD-10-CM | POA: Insufficient documentation

## 2017-12-22 DIAGNOSIS — R05 Cough: Secondary | ICD-10-CM | POA: Insufficient documentation

## 2017-12-22 DIAGNOSIS — IMO0001 Reserved for inherently not codable concepts without codable children: Secondary | ICD-10-CM

## 2017-12-22 LAB — GLUCOSE, POCT (MANUAL RESULT ENTRY): POC GLUCOSE: 55 mg/dL — AB (ref 70–99)

## 2017-12-22 MED ORDER — BENZONATATE 100 MG PO CAPS: 100.0000 mg | ORAL_CAPSULE | Freq: Two times a day (BID) | ORAL | 0 refills | Status: DC | PRN

## 2017-12-22 MED ORDER — METHOCARBAMOL 750 MG PO TABS: 750.0000 mg | ORAL_TABLET | Freq: Two times a day (BID) | ORAL | 1 refills | Status: DC | PRN

## 2017-12-22 MED ORDER — KETOROLAC TROMETHAMINE 60 MG/2ML IM SOLN
60.0000 mg | Freq: Once | INTRAMUSCULAR | Status: AC
  Administered 2017-12-22: 60 mg via INTRAMUSCULAR

## 2017-12-22 MED FILL — METHOCARBAMOL 750 MG TABS: 750 | 30 days supply | Qty: 60 | Fill #0

## 2017-12-22 MED FILL — BENZONATATE 100 MG CAPSULE: 100 | 10 days supply | Qty: 20 | Fill #0

## 2017-12-22 NOTE — Progress Notes (Signed)
Subjective:  Patient ID: [name], male    DOB: 11-06-1961  Age: 57 y.o. MRN: [id number]  CC: Cough; Hypertension; and Diabetes   HPI [name]  is a 57 year old male with a history of type 2 diabetes mellitus(A1c 12.9), hypertension, hyperlipidemia, gout, osteoarthritis of the knee who presents today for follow-up of Hypertension and diabetes mellitus.  He was treated for bilateral lower extremity cellulitis at his last office visit and reports resolution of symptoms; he has completed his course of antibiotics.  Today he complains of a 2-week history of cough productive of blood-tinged sputum, stuffy nose and associated right upper quadrant pain whenever he coughs.  He denies the presence of fever but endorses chills.  History is sort of confusing as he is unable to endorse or deny certain symptoms.  His blood sugar in the clinic is 55 and he endorses eating eating a ham and cheese sandwich this morning about 2 hours ago but also endorses taking his NovoLog 70/30, 50 units last night and this morning.  He is asymptomatic and hence the glucose has been administered.  Past Medical History:  Diagnosis Date  . Gouty bursitis, elbow  10/18/2015  . Hx of gout   . Hyperlipidemia   . Hypertension   . Knee pain, chronic   . Type II diabetes mellitus ([place])     Past Surgical History:  Procedure Laterality Date  . KNEE ARTHROSCOPY Bilateral 12/29/2015   Procedure: ARTHROSCOPY KNEE BILATERAL WITH  PARTIAL MEDIAL AND LATERAL MENISCECTOMY AND FOUR COMPARTMENT SYNOVECTOMY , CHONDRALPLASTIES, PATELLA-FEMORAL CHONDRALPLASTIES BILATERALLY;  Surgeon: Dorna Leitz, MD;  Location: [facility];  Service: Orthopedics;  Laterality: Bilateral;  . NO PAST SURGERIES      No Known Allergies   Outpatient Medications Prior to Visit  Medication Sig Dispense Refill  . amLODipine (NORVASC) 10 MG tablet Take 1 tablet (10 mg total) by mouth daily. 30 tablet 5  . aspirin 81 MG EC tablet Take 1 tablet (81  mg total) by mouth daily. 90 tablet 1  . atorvastatin (LIPITOR) 40 MG tablet TAKE 1 TABLET BY MOUTH DAILY AT 6 PM. 30 tablet 5  . Blood Glucose Monitoring Suppl (TRUE METRIX METER) w/Device KIT 1 Device by Does not apply route 3 (three) times daily. 1 kit 0  . butalbital-acetaminophen-caffeine (FIORICET, ESGIC) 50-325-40 MG tablet Take 1 tablet by mouth every 6 (six) hours as needed for headache. 30 tablet 0  . cephALEXin (KEFLEX) 500 MG capsule Take 1 capsule (500 mg total) by mouth 4 (four) times daily. 40 capsule 0  . cetirizine (ZYRTEC) 10 MG tablet Take 1 tablet (10 mg total) by mouth daily. 30 tablet 3  . colchicine 0.6 MG tablet Take 0.6 mg by mouth daily.    . FEROSUL 325 (65 Fe) MG tablet TAKE 1 TABLET BY MOUTH DAILY WITH BREAKFAST. 30 tablet 2  . furosemide (LASIX) 20 MG tablet Take 1 tablet (20 mg total) by mouth daily. 20 tablet 0  . gabapentin (NEURONTIN) 300 MG capsule Take 1 capsule (300 mg total) by mouth 2 (two) times daily. 60 capsule 3  . glucose blood (TRUE METRIX BLOOD GLUCOSE TEST) test strip Use as instructed 3 times daily 100 each 12  . insulin aspart protamine- aspart (NOVOLOG MIX 70/30) (70-30) 100 UNIT/ML injection Inject 0.5 mLs (50 Units total) into the skin 2 (two) times daily with a meal. 60 mL 3  . lisinopril-hydrochlorothiazide (PRINZIDE,ZESTORETIC) 20-25 MG tablet Take 1 tablet by mouth daily. 30 tablet 3  .  metFORMIN (GLUCOPHAGE) 500 MG tablet Take 2 tablets (1,000 mg total) by mouth 2 (two) times daily with a meal. 120 tablet 3  . metoprolol succinate (TOPROL-XL) 50 MG 24 hr tablet Take 1 tablet (50 mg total) by mouth daily. 30 tablet 3  . traZODone (DESYREL) 50 MG tablet Take 1 tablet (50 mg total) by mouth at bedtime. 30 tablet 3  . TRUEPLUS INSULIN SYRINGE 30G X 5/16" 0.5 ML MISC USE FOR INSULIN INJECTION AS DIRECTED 100 each 2  . TRUEPLUS LANCETS 28G MISC 1 Device by Does not apply route 3 (three) times daily. 90 each 12   No facility-administered  medications prior to visit.     ROS Review of Systems  Constitutional: Negative for activity change and appetite change.  HENT: Positive for congestion. Negative for sinus pressure and sore throat.   Eyes: Negative for visual disturbance.  Respiratory: Positive for cough. Negative for chest tightness and shortness of breath.   Cardiovascular: Negative for chest pain and leg swelling.  Gastrointestinal: Positive for abdominal pain. Negative for abdominal distention, constipation and diarrhea.  Endocrine: Negative.   Genitourinary: Negative for dysuria.  Musculoskeletal: Negative for joint swelling and myalgias.  Skin: Negative for rash.  Allergic/Immunologic: Negative.   Neurological: Negative for weakness, light-headedness and numbness.  Psychiatric/Behavioral: Negative for dysphoric mood and suicidal ideas.    Objective:  BP 122/78   Pulse 92   Temp (!) 97.5 F (36.4 C) (Oral)   Ht _0  (1.651 m)   Wt 273 lb (123.8 kg)   SpO2 95%   BMI 45.43 kg/m   BP/Weight 12/22/2017 12/01/2017 09/03/5101  Systolic BP 585 277 824  Diastolic BP 78 81 90  Wt. (Lbs) 273 268 268.4  BMI 45.43 44.6 44.66      Physical Exam  Constitutional: He is oriented to person, place, and time. He appears well-developed and well-nourished.  Cardiovascular: Normal rate, normal heart sounds and intact distal pulses.  No murmur heard. Pulmonary/Chest: Effort normal and breath sounds normal. He has no wheezes. He has no rales. He exhibits no tenderness.  Abdominal: Soft. Bowel sounds are normal. He exhibits no distension and no mass. There is no tenderness.  Musculoskeletal: Normal range of motion. He exhibits tenderness (Exquisite TTP of right upper quadrant even on slight palpation; no rash visible.).  Neurological: He is alert and oriented to person, place, and time.  Psychiatric: He has a normal mood and affect.     Lab Results  Component Value Date   HGBA1C 12.9 12/01/2017    Assessment &  Plan:   1. IDDM (insulin dependent diabetes mellitus) ([place]) Uncontrolled with last A1c of 12.9 from 11/2017 Insulin regimen being adjusted at his last visit He is hypoglycemic today with a blood sugar of 55 and instant glucose administered in the clinic with repeat blood sugar 79 We have discussed management of hypoglycemia and the fact that he would need to reduce his insulin dose and notify the clinic in that event.  He denies any prior episodes of hypoglycemia. - POCT glucose (manual entry)  2. Musculoskeletal pain Right-sided abdominal wall pain from persistent coughing We will place on muscle relaxant - methocarbamol (ROBAXIN-750) 750 MG tablet; Take 1 tablet (750 mg total) by mouth 2 (two) times daily as needed for muscle spasms.  Dispense: 60 tablet; Refill: 1 - ketorolac (TORADOL) injection 60 mg  3. Cough Could be from postnasal drip Need to exclude pneumonia - DG Chest 2 View; Future - benzonatate (TESSALON) 100 MG  capsule; Take 1 capsule (100 mg total) by mouth 2 (two) times daily as needed for cough.  Dispense: 20 capsule; Refill: 0   Meds ordered this encounter  Medications  . benzonatate (TESSALON) 100 MG capsule    Sig: Take 1 capsule (100 mg total) by mouth 2 (two) times daily as needed for cough.    Dispense:  20 capsule    Refill:  0  . methocarbamol (ROBAXIN-750) 750 MG tablet    Sig: Take 1 tablet (750 mg total) by mouth 2 (two) times daily as needed for muscle spasms.    Dispense:  60 tablet    Refill:  1  . ketorolac (TORADOL) injection 60 mg    Follow-up: Return in about 3 months (around 03/21/2018) for follow up of chronic medical conditions.   Charlott Rakes MD

## 2017-12-22 NOTE — Patient Instructions (Signed)
Hypoglycemia Hypoglycemia is when the sugar (glucose) level in the blood is too low. Symptoms of low blood sugar may include:  Feeling: ? Hungry. ? Worried or nervous (anxious). ? Sweaty and clammy. ? Confused. ? Dizzy. ? Sleepy. ? Sick to your stomach (nauseous).  Having: ? A fast heartbeat. ? A headache. ? A change in your vision. ? Jerky movements that you cannot control (seizure). ? Nightmares. ? Tingling or no feeling (numbness) around the mouth, lips, or tongue.  Having trouble with: ? Talking. ? Paying attention (concentrating). ? Moving (coordination). ? Sleeping.  Shaking.  Passing out (fainting).  Getting upset easily (irritability).  Low blood sugar can happen to people who have diabetes and people who do not have diabetes. Low blood sugar can happen quickly, and it can be an emergency. Treating Low Blood Sugar Low blood sugar is often treated by eating or drinking something sugary right away. If you can think clearly and swallow safely, follow the 15:15 rule:  Take 15 grams of a fast-acting carb (carbohydrate). Some fast-acting carbs are: ? 1 tube of glucose gel. ? 3 sugar tablets (glucose pills). ? 6-8 pieces of hard candy. ? 4 oz (120 mL) of fruit juice. ? 4 oz (120 mL) of regular (not diet) soda.  Check your blood sugar 15 minutes after you take the carb.  If your blood sugar is still at or below 70 mg/dL (3.9 mmol/L), take 15 grams of a carb again.  If your blood sugar does not go above 70 mg/dL (3.9 mmol/L) after 3 tries, get help right away.  After your blood sugar goes back to normal, eat a meal or a snack within 1 hour.  Treating Very Low Blood Sugar If your blood sugar is at or below 54 mg/dL (3 mmol/L), you have very low blood sugar (severe hypoglycemia). This is an emergency. Do not wait to see if the symptoms will go away. Get medical help right away. Call your local emergency services (911 in the [place] Do not drive yourself to the  hospital. If you have very low blood sugar and you cannot eat or drink, you may need a glucagon shot (injection). A family member or friend should learn how to check your blood sugar and how to give you a glucagon shot. Ask your doctor if you need to have a glucagon shot kit at home. Follow these instructions at home: General instructions  Avoid any diets that cause you to not eat enough food. Talk with your doctor before you start any new diet.  Take over-the-counter and prescription medicines only as told by your doctor.  Limit alcohol to no more than 1 drink per day for nonpregnant women and 2 drinks per day for men. One drink equals 12 oz of beer, 5 oz of wine, or 1 oz of hard liquor.  Keep all follow-up visits as told by your doctor. This is important. If You Have Diabetes:   Make sure you know the symptoms of low blood sugar.  Always keep a source of sugar with you, such as: ? Sugar. ? Sugar tablets. ? Glucose gel. ? Fruit juice. ? Regular soda (not diet soda). ? Milk. ? Hard candy. ? Honey.  Take your medicines as told.  Follow your exercise and meal plan. ? Eat on time. Do not skip meals. ? Follow your sick day plan when you cannot eat or drink normally. Make this plan ahead of time with your doctor.  Check your blood sugar as often   as told by your doctor. Always check before and after exercise.  Share your diabetes care plan with: ? Your work or school. ? People you live with.  Check your pee (urine) for ketones: ? When you are sick. ? As told by your doctor.  Carry a card or wear jewelry that says you have diabetes. If You Have Low Blood Sugar From Other Causes:   Check your blood sugar as often as told by your doctor.  Follow instructions from your doctor about what you cannot eat or drink. Contact a doctor if:  You have trouble keeping your blood sugar in your target range.  You have low blood sugar often. Get help right away if:  You still have  symptoms after you eat or drink something sugary.  Your blood sugar is at or below 54 mg/dL (3 mmol/L).  You have jerky movements that you cannot control.  You pass out. These symptoms may be an emergency. Do not wait to see if the symptoms will go away. Get medical help right away. Call your local emergency services (911 in the [place] Do not drive yourself to the hospital. This information is not intended to replace advice given to you by your health care provider. Make sure you discuss any questions you have with your health care provider. Document Released: 01/29/2010 Document Revised: 04/11/2016 Document Reviewed: 12/08/2015 Elsevier Interactive Patient Education  [employer].

## 2018-01-06 ENCOUNTER — Other Ambulatory Visit: Payer: Self-pay | Admitting: Physician Assistant

## 2018-01-06 DIAGNOSIS — IMO0001 Reserved for inherently not codable concepts without codable children: Secondary | ICD-10-CM

## 2018-01-06 DIAGNOSIS — E119 Type 2 diabetes mellitus without complications: Principal | ICD-10-CM

## 2018-01-06 DIAGNOSIS — Z794 Long term (current) use of insulin: Principal | ICD-10-CM

## 2018-01-06 MED FILL — LISINOPRIL-HCTZ 20-25 MG TA: 20-25 | 30 days supply | Qty: 30 | Fill #1

## 2018-01-06 MED FILL — ?ATORVASTATIN 40MG TABLET: 40 | 30 days supply | Qty: 30 | Fill #3

## 2018-01-06 MED FILL — ?CETIRIZINE HCL 10 MG TABLE: 10 | 30 days supply | Qty: 30 | Fill #0

## 2018-01-06 MED FILL — METOPROLOL SUCCINATE ER 50: 50 | 30 days supply | Qty: 30 | Fill #3

## 2018-01-06 MED FILL — AMLODIPINE BESYLATE 10 MG T: 10 | 30 days supply | Qty: 30 | Fill #3

## 2018-01-06 MED FILL — TRUEplus LANCETS 28G MISC: 30 days supply | Qty: 100 | Fill #0

## 2018-01-20 MED FILL — !NOVOLOG MIX 70/30 VIAL: 70-30/ML | 30 days supply | Qty: 30 | Fill #1

## 2018-01-22 MED FILL — GABAPENTIN 300 MG CAPSULE: 300 | 30 days supply | Qty: 60 | Fill #0

## 2018-01-22 MED FILL — ?METFORMIN HCL 500MG TABLET: 500 | 30 days supply | Qty: 120 | Fill #0

## 2018-02-04 MED FILL — AMLODIPINE BESYLATE 10 MG T: 10 | 30 days supply | Qty: 30 | Fill #4

## 2018-02-04 MED FILL — ?CETIRIZINE HCL 10 MG TABLE: 10 | 30 days supply | Qty: 30 | Fill #1

## 2018-02-04 MED FILL — LISINOPRIL-HCTZ 20-25 MG TA: 20-25 | 30 days supply | Qty: 30 | Fill #2

## 2018-02-04 MED FILL — ?ATORVASTATIN 40MG TABLET: 40 | 30 days supply | Qty: 30 | Fill #4

## 2018-02-04 MED FILL — METOPROLOL SUCCINATE ER 50: 50 | 30 days supply | Qty: 30 | Fill #0

## 2018-02-05 ENCOUNTER — Ambulatory Visit: Payer: Self-pay | Attending: Family Medicine | Admitting: Physician Assistant

## 2018-02-05 VITALS — BP 143/93 | HR 92 | Temp 97.6°F | Ht 65.0 in | Wt 274.0 lb

## 2018-02-05 DIAGNOSIS — IMO0001 Reserved for inherently not codable concepts without codable children: Secondary | ICD-10-CM

## 2018-02-05 DIAGNOSIS — E119 Type 2 diabetes mellitus without complications: Secondary | ICD-10-CM | POA: Insufficient documentation

## 2018-02-05 DIAGNOSIS — L03115 Cellulitis of right lower limb: Secondary | ICD-10-CM

## 2018-02-05 DIAGNOSIS — M109 Gout, unspecified: Secondary | ICD-10-CM | POA: Insufficient documentation

## 2018-02-05 DIAGNOSIS — Z7982 Long term (current) use of aspirin: Secondary | ICD-10-CM | POA: Insufficient documentation

## 2018-02-05 DIAGNOSIS — E785 Hyperlipidemia, unspecified: Secondary | ICD-10-CM | POA: Insufficient documentation

## 2018-02-05 DIAGNOSIS — Z794 Long term (current) use of insulin: Secondary | ICD-10-CM | POA: Insufficient documentation

## 2018-02-05 DIAGNOSIS — I10 Essential (primary) hypertension: Secondary | ICD-10-CM | POA: Insufficient documentation

## 2018-02-05 DIAGNOSIS — Z79899 Other long term (current) drug therapy: Secondary | ICD-10-CM | POA: Insufficient documentation

## 2018-02-05 DIAGNOSIS — L03116 Cellulitis of left lower limb: Secondary | ICD-10-CM | POA: Insufficient documentation

## 2018-02-05 LAB — GLUCOSE, POCT (MANUAL RESULT ENTRY): POC GLUCOSE: 337 mg/dL — AB (ref 70–99)

## 2018-02-05 MED ORDER — FUROSEMIDE 20 MG PO TABS: 20.0000 mg | ORAL_TABLET | Freq: Every day | ORAL | 0 refills | Status: DC

## 2018-02-05 MED ORDER — DOXYCYCLINE HYCLATE 100 MG PO TABS: 100.0000 mg | ORAL_TABLET | Freq: Two times a day (BID) | ORAL | 0 refills | Status: DC

## 2018-02-05 MED ORDER — MUPIROCIN 2 % EX OINT: TOPICAL_OINTMENT | CUTANEOUS | 2 refills | Status: DC

## 2018-02-05 MED FILL — ?DOXYCYCLINE 100MG TABLET: 100 | 10 days supply | Qty: 20 | Fill #0

## 2018-02-05 MED FILL — MUPIROCIN 2% OINTMENT: 2 | 10 days supply | Qty: 22 | Fill #0

## 2018-02-05 MED FILL — ?FUROSEMIDE 20 MG TABLET: 20 | 20 days supply | Qty: 20 | Fill #0

## 2018-02-05 NOTE — Progress Notes (Signed)
[name], is a 57 y.o. male  [id number]:[id number]  MGN:[id number]  DOB - Jan 11, 1961  Subjective:  Chief Complaint and HPI: [name] is a 57 y.o. male here today having recurrent problem with cellulitis in L lower leg.  + skin break down and erythema is worse on L lower leg the last few days.  No f/c.  Blood sugars running >200.  Admits to poor diet.  Doesn't drink water-drinks mostly orange juice.     ROS:   Constitutional:  No f/c, No night sweats, No unexplained weight loss. EENT:  No vision changes, No blurry vision, No hearing changes. No mouth, throat, or ear problems.  Respiratory: No cough, No SOB Cardiac: No CP, no palpitations GI:  No abd pain, No N/V/D. GU: No Urinary s/sx Musculoskeletal: No joint pain Neuro: No headache, no dizziness, no motor weakness.  Skin: No rash Endocrine:  No polydipsia. No polyuria.  Psych: Denies SI/HI  No problems updated.  ALLERGIES: No Known Allergies  PAST MEDICAL HISTORY: Past Medical History:  Diagnosis Date  . Gouty bursitis, elbow  10/18/2015  . Hx of gout   . Hyperlipidemia   . Hypertension   . Knee pain, chronic   . Type II diabetes mellitus ([place])     MEDICATIONS AT HOME: Prior to Admission medications   Medication Sig Start Date End Date Taking? Authorizing Provider  amLODipine (NORVASC) 10 MG tablet Take 1 tablet (10 mg total) by mouth daily. 12/01/17  Yes Charlott Rakes, MD  aspirin 81 MG EC tablet Take 1 tablet (81 mg total) by mouth daily. 12/01/17  Yes Newlin, Charlane Ferretti, MD  atorvastatin (LIPITOR) 40 MG tablet TAKE 1 TABLET BY MOUTH DAILY AT 6 PM. 12/01/17  Yes Newlin, Enobong, MD  benzonatate (TESSALON) 100 MG capsule Take 1 capsule (100 mg total) by mouth 2 (two) times daily as needed for cough. 12/22/17  Yes Charlott Rakes, MD  Blood Glucose Monitoring Suppl (TRUE METRIX METER) w/Device KIT 1 Device by Does not apply route 3 (three) times daily. 10/18/16  Yes Jaqualyn Juday, Dionne Bucy, PA-C    butalbital-acetaminophen-caffeine (FIORICET, ESGIC) [phone] MG tablet Take 1 tablet by mouth every 6 (six) hours as needed for headache. 08/11/17 08/11/18 Yes Charlott Rakes, MD  cephALEXin (KEFLEX) 500 MG capsule Take 1 capsule (500 mg total) by mouth 4 (four) times daily. 12/01/17  Yes Charlott Rakes, MD  cetirizine (ZYRTEC) 10 MG tablet Take 1 tablet (10 mg total) by mouth daily. 12/01/17  Yes Charlott Rakes, MD  colchicine 0.6 MG tablet Take 0.6 mg by mouth daily.   Yes [provider]  FEROSUL 325 (65 Fe) MG tablet TAKE 1 TABLET BY MOUTH DAILY WITH BREAKFAST. 10/06/17  Yes Charlott Rakes, MD  furosemide (LASIX) 20 MG tablet Take 1 tablet (20 mg total) by mouth daily. 02/05/18  Yes Argentina Donovan, PA-C  gabapentin (NEURONTIN) 300 MG capsule Take 1 capsule (300 mg total) by mouth 2 (two) times daily. 12/01/17  Yes Charlott Rakes, MD  glucose blood (TRUE METRIX BLOOD GLUCOSE TEST) test strip Use as instructed 3 times daily 12/26/16  Yes Newlin, Enobong, MD  insulin aspart protamine- aspart (NOVOLOG MIX 70/30) (70-30) 100 UNIT/ML injection Inject 0.5 mLs (50 Units total) into the skin 2 (two) times daily with a meal. 12/01/17  Yes Newlin, Enobong, MD  lisinopril-hydrochlorothiazide (PRINZIDE,ZESTORETIC) 20-25 MG tablet Take 1 tablet by mouth daily. 12/01/17  Yes Charlott Rakes, MD  metFORMIN (GLUCOPHAGE) 500 MG tablet Take 2 tablets (1,000 mg total) by mouth  2 (two) times daily with a meal. 12/01/17  Yes Newlin, Enobong, MD  methocarbamol (ROBAXIN-750) 750 MG tablet Take 1 tablet (750 mg total) by mouth 2 (two) times daily as needed for muscle spasms. 12/22/17  Yes Charlott Rakes, MD  metoprolol succinate (TOPROL-XL) 50 MG 24 hr tablet Take 1 tablet (50 mg total) by mouth daily. 12/01/17  Yes Charlott Rakes, MD  traZODone (DESYREL) 50 MG tablet Take 1 tablet (50 mg total) by mouth at bedtime. 12/01/17  Yes Charlott Rakes, MD  TRUEPLUS INSULIN SYRINGE 30G X 5/16" 0.5 ML MISC USE FOR INSULIN  INJECTION AS DIRECTED 11/25/17  Yes Charlott Rakes, MD  TRUEPLUS LANCETS 28G MISC USE AS DIRECTED 3 TIMES DAILY. 01/06/18  Yes Charlott Rakes, MD  doxycycline (VIBRA-TABS) 100 MG tablet Take 1 tablet (100 mg total) by mouth 2 (two) times daily. 02/05/18   Argentina Donovan, PA-C  mupirocin ointment (BACTROBAN) 2 % Apply bid to AA X 10 days 02/05/18   Argentina Donovan, PA-C     Objective:  EXAM:   Vitals:   02/05/18 1025  BP: (!) 143/93  Pulse: 92  Temp: 97.6 F (36.4 C)  TempSrc: Oral  SpO2: 97%  Weight: 274 lb (124.3 kg)  Height: '5\' 5"'  (1.651 m)    General appearance : A&OX3. NAD. Non-toxic-appearing HEENT: Atraumatic and Normocephalic.  PERRLA. EOM intact.   Neck: supple, no JVD. No cervical lymphadenopathy. No thyromegaly Chest/Lungs:  Breathing-non-labored, Good air entry bilaterally, breath sounds normal without rales, rhonchi, or wheezing  CVS: S1 S2 regular, no murmurs, gallops, rubs  Extremities: Bilateral Lower Ext shows no edema, both legs are warm to touch with = pulse throughout.  BLE with severe skin dryness and erythema.  He has 2+ pitting edema.  L lateral lower leg with small area ~2cm of concern for early abscess.  No induration or central fluctuance.   Neurology:  CN II-XII grossly intact, Non focal.   Psych:  TP linear. J/I WNL. Normal speech. Appropriate eye contact and affect.  Skin:  No Rash  Data Review Lab Results  Component Value Date   HGBA1C 12.9 12/01/2017   HGBA1C >15 08/11/2017   HGBA1C 14.0 05/07/2017     Assessment & Plan   1. IDDM (insulin dependent diabetes mellitus) ([place]) Uncontrolled.-  Work on diet.  Drink more water.  Take all meds as directed - Glucose (CBG) - Basic metabolic panel  2. Left leg cellulitis Wound care discussed - doxycycline (VIBRA-TABS) 100 MG tablet; Take 1 tablet (100 mg total) by mouth 2 (two) times daily.  Dispense: 20 tablet; Refill: 0 - mupirocin ointment (BACTROBAN) 2 %; Apply bid to AA X 10 days  Dispense:  22 g; Refill: 2 - furosemide (LASIX) 20 MG tablet; Take 1 tablet (20 mg total) by mouth daily.  Dispense: 20 tablet; Refill: 0(take twice a day for 3 days if labs are normal)  elevate your legs.  Drink water instead of orange juice.  Go to the emergency room if this worsens or your develop fever.     Patient have been counseled extensively about nutrition and exercise  Return for keep appt in [place] with Dr Margarita Rana.  The patient was given clear instructions to go to ER or return to medical center if symptoms don't improve, worsen or new problems develop. The patient verbalized understanding. The patient was told to call to get lab results if they haven't heard anything in the next week.     Freeman Caldron, PA-C [facility] and [place], [place]   02/05/2018, 11:13 AMPatient ID: [name], male   DOB: 18-Jun-1961, 57 y.o.   MRN: [id number]

## 2018-02-05 NOTE — Patient Instructions (Addendum)
elevate your legs.  Drink water instead of orange juice.  Go to the emergency room if this worsens or your develop fever.

## 2018-02-06 LAB — BASIC METABOLIC PANEL
BUN / CREAT RATIO: 15 (ref 9–20)
BUN: 18 mg/dL (ref 6–24)
CHLORIDE: 100 mmol/L (ref 96–106)
CO2: 24 mmol/L (ref 20–29)
CREATININE: 1.2 mg/dL (ref 0.76–1.27)
Calcium: 9.6 mg/dL (ref 8.7–10.2)
GFR calc non Af Amer: 66 mL/min/{1.73_m2} (ref >59)
GFR, EST AFRICAN AMERICAN: 77 mL/min/{1.73_m2} (ref >59)
Glucose: 294 mg/dL — ABNORMAL HIGH (ref 65–99)
Potassium: 4.8 mmol/L (ref 3.5–5.2)
Sodium: 142 mmol/L (ref 134–144)

## 2018-02-12 ENCOUNTER — Telehealth: Payer: Self-pay

## 2018-02-12 NOTE — Telephone Encounter (Signed)
CMA attempt to call patient to inform on lab results.   No answer and left a message for patient to call back for his results.   If patient call back, please inform:  Please call patient.  His labs other than his blood sugar were normal.  Check blood sugars, work on diet, and take meds as prescribed and follow-up as planned. Thanks, Freeman Caldron, PA-C

## 2018-02-12 NOTE — Telephone Encounter (Signed)
-----   Message from Argentina Donovan, [place] sent at 02/09/2018 10:27 AM EDT ----- Please call patient.  His labs other than his blood sugar were normal.  Check blood sugars, work on diet, and take meds as prescribed and follow-up as planned. Thanks, Freeman Caldron, PA-C

## 2018-02-16 MED FILL — !NOVOLOG MIX 70/30 VIAL: 70-30/ML | 30 days supply | Qty: 30 | Fill #2

## 2018-02-19 ENCOUNTER — Ambulatory Visit: Payer: Self-pay | Attending: Family Medicine | Admitting: Physician Assistant

## 2018-02-19 VITALS — BP 125/83 | HR 76 | Temp 98.5°F | Resp 16 | Ht 65.0 in | Wt 286.0 lb

## 2018-02-19 DIAGNOSIS — I10 Essential (primary) hypertension: Secondary | ICD-10-CM | POA: Insufficient documentation

## 2018-02-19 DIAGNOSIS — L03116 Cellulitis of left lower limb: Secondary | ICD-10-CM | POA: Insufficient documentation

## 2018-02-19 DIAGNOSIS — E1169 Type 2 diabetes mellitus with other specified complication: Secondary | ICD-10-CM | POA: Insufficient documentation

## 2018-02-19 DIAGNOSIS — M109 Gout, unspecified: Secondary | ICD-10-CM | POA: Insufficient documentation

## 2018-02-19 DIAGNOSIS — Z7982 Long term (current) use of aspirin: Secondary | ICD-10-CM | POA: Insufficient documentation

## 2018-02-19 DIAGNOSIS — Z794 Long term (current) use of insulin: Secondary | ICD-10-CM | POA: Insufficient documentation

## 2018-02-19 DIAGNOSIS — Z79899 Other long term (current) drug therapy: Secondary | ICD-10-CM | POA: Insufficient documentation

## 2018-02-19 DIAGNOSIS — E785 Hyperlipidemia, unspecified: Secondary | ICD-10-CM | POA: Insufficient documentation

## 2018-02-19 LAB — GLUCOSE, POCT (MANUAL RESULT ENTRY): POC GLUCOSE: 214 mg/dL — AB (ref 70–99)

## 2018-02-19 MED ORDER — MUPIROCIN 2 % EX OINT: TOPICAL_OINTMENT | CUTANEOUS | 2 refills | Status: DC

## 2018-02-19 MED ORDER — DOXYCYCLINE HYCLATE 100 MG PO TABS: 100.0000 mg | ORAL_TABLET | Freq: Two times a day (BID) | ORAL | 0 refills | Status: DC

## 2018-02-19 MED ORDER — ACETAMINOPHEN-CODEINE #3 300-30 MG PO TABS
1.0000 | ORAL_TABLET | Freq: Four times a day (QID) | ORAL | 0 refills | Status: DC | PRN
Start: 2018-02-19 — End: 2019-06-29

## 2018-02-19 MED FILL — MUPIROCIN 2% OINTMENT: 2 | 10 days supply | Qty: 22 | Fill #0

## 2018-02-19 MED FILL — ACETAMINOPHEN/COD #3 TABLET: 300-30 | 5 days supply | Qty: 20 | Fill #0

## 2018-02-19 MED FILL — ?DOXYCYCLINE 100MG TABLET: 100 | 10 days supply | Qty: 20 | Fill #0

## 2018-02-19 NOTE — Progress Notes (Signed)
cPt. Stated his left leg are painful with swelling. Pt. Request if he can get something for pain. Pt. Stated "throb like a toothache"  Pt. Stated he don't have any more Mupirocin Ointment.

## 2018-02-19 NOTE — Progress Notes (Signed)
Antony Sian, is a 57 y.o. male  YJE:[id number]  OVZ:[id number]  DOB - Mar 05, 1961  Subjective:  Chief Complaint and HPI: [name] is a 57 y.o. male here today to recheck L lower leg cellulitis.  No f/c.  Feels it has improved but isn't well.  Throbs at night.  Says swelling in legs has improved.  Requesting something for pain for night-time.  Blood sugars not controlled but says he is drinking more water and ot drinking orange juice any more since last visit.     ROS:   Constitutional:  No f/c, No night sweats, No unexplained weight loss. EENT:  No vision changes, No blurry vision, No hearing changes. No mouth, throat, or ear problems.  Respiratory: No cough, No SOB Cardiac: No CP, no palpitations GI:  No abd pain, No N/V/D. GU: No Urinary s/sx Musculoskeletal: + leg pain on L Neuro: No headache, no dizziness, no motor weakness.  Skin: No rash Endocrine:  No polydipsia. No polyuria.  Psych: Denies SI/HI  No problems updated.  ALLERGIES: No Known Allergies  PAST MEDICAL HISTORY: Past Medical History:  Diagnosis Date  . Gouty bursitis, elbow  10/18/2015  . Hx of gout   . Hyperlipidemia   . Hypertension   . Knee pain, chronic   . Type II diabetes mellitus ([place])     MEDICATIONS AT HOME: Prior to Admission medications   Medication Sig Start Date End Date Taking? Authorizing Provider  amLODipine (NORVASC) 10 MG tablet Take 1 tablet (10 mg total) by mouth daily. 12/01/17  Yes Charlott Rakes, MD  aspirin 81 MG EC tablet Take 1 tablet (81 mg total) by mouth daily. 12/01/17  Yes Newlin, Charlane Ferretti, MD  atorvastatin (LIPITOR) 40 MG tablet TAKE 1 TABLET BY MOUTH DAILY AT 6 PM. 12/01/17  Yes Newlin, Enobong, MD  benzonatate (TESSALON) 100 MG capsule Take 1 capsule (100 mg total) by mouth 2 (two) times daily as needed for cough. 12/22/17  Yes Charlott Rakes, MD  Blood Glucose Monitoring Suppl (TRUE METRIX METER) w/Device KIT 1 Device by Does not apply route 3 (three) times daily.  10/18/16  Yes Shirrell Solinger, Dionne Bucy, PA-C  butalbital-acetaminophen-caffeine (FIORICET, ESGIC) [phone] MG tablet Take 1 tablet by mouth every 6 (six) hours as needed for headache. 08/11/17 08/11/18 Yes Charlott Rakes, MD  cetirizine (ZYRTEC) 10 MG tablet Take 1 tablet (10 mg total) by mouth daily. 12/01/17  Yes Newlin, Enobong, MD  FEROSUL 325 (65 Fe) MG tablet TAKE 1 TABLET BY MOUTH DAILY WITH BREAKFAST. 10/06/17  Yes Charlott Rakes, MD  furosemide (LASIX) 20 MG tablet Take 1 tablet (20 mg total) by mouth daily. 02/05/18  Yes Argentina Donovan, PA-C  gabapentin (NEURONTIN) 300 MG capsule Take 1 capsule (300 mg total) by mouth 2 (two) times daily. 12/01/17  Yes Charlott Rakes, MD  glucose blood (TRUE METRIX BLOOD GLUCOSE TEST) test strip Use as instructed 3 times daily 12/26/16  Yes Newlin, Enobong, MD  insulin aspart protamine- aspart (NOVOLOG MIX 70/30) (70-30) 100 UNIT/ML injection Inject 0.5 mLs (50 Units total) into the skin 2 (two) times daily with a meal. 12/01/17  Yes Newlin, Enobong, MD  lisinopril-hydrochlorothiazide (PRINZIDE,ZESTORETIC) 20-25 MG tablet Take 1 tablet by mouth daily. 12/01/17  Yes Charlott Rakes, MD  metFORMIN (GLUCOPHAGE) 500 MG tablet Take 2 tablets (1,000 mg total) by mouth 2 (two) times daily with a meal. 12/01/17  Yes Charlott Rakes, MD  mupirocin ointment (BACTROBAN) 2 % Apply bid to AA X 10 days 02/19/18  Yes  Freeman Caldron M, PA-C  traZODone (DESYREL) 50 MG tablet Take 1 tablet (50 mg total) by mouth at bedtime. 12/01/17  Yes Charlott Rakes, MD  TRUEPLUS INSULIN SYRINGE 30G X 5/16" 0.5 ML MISC USE FOR INSULIN INJECTION AS DIRECTED 11/25/17  Yes Charlott Rakes, MD  TRUEPLUS LANCETS 28G MISC USE AS DIRECTED 3 TIMES DAILY. 01/06/18  Yes Charlott Rakes, MD  acetaminophen-codeine (TYLENOL #3) 300-30 MG tablet Take 1 tablet by mouth every 6 (six) hours as needed for moderate pain. 02/19/18   Argentina Donovan, PA-C  cephALEXin (KEFLEX) 500 MG capsule Take 1 capsule (500 mg total) by  mouth 4 (four) times daily. Patient not taking: Reported on 02/19/2018 12/01/17   Charlott Rakes, MD  colchicine 0.6 MG tablet Take 0.6 mg by mouth daily.    [provider]  doxycycline (VIBRA-TABS) 100 MG tablet Take 1 tablet (100 mg total) by mouth 2 (two) times daily. 02/19/18   Argentina Donovan, PA-C  methocarbamol (ROBAXIN-750) 750 MG tablet Take 1 tablet (750 mg total) by mouth 2 (two) times daily as needed for muscle spasms. Patient not taking: Reported on 02/19/2018 12/22/17   Charlott Rakes, MD  metoprolol succinate (TOPROL-XL) 50 MG 24 hr tablet Take 1 tablet (50 mg total) by mouth daily. Patient not taking: Reported on 02/19/2018 12/01/17   Charlott Rakes, MD     Objective:  EXAM:   Vitals:   02/19/18 0938  BP: 125/83  Pulse: 76  Resp: 16  Temp: 98.5 F (36.9 C)  TempSrc: Oral  SpO2: 100%  Weight: 286 lb (129.7 kg)  Height: '5\' 5"'  (1.651 m)    General appearance : A&OX3. NAD. Non-toxic-appearing HEENT: Atraumatic and Normocephalic.  PERRLA. EOM intact.   Neck: supple, no JVD. No cervical lymphadenopathy. No thyromegaly Chest/Lungs:  Breathing-non-labored, Good air entry bilaterally, breath sounds normal without rales, rhonchi, or wheezing  CVS: S1 S2 regular, no murmurs, gallops, rubs  Extremities: Bilateral Lower Ext shows no edema, both legs are warm to touch with = pulse throughout.  L lower lateral leg with thickened skin and scabbing with central ulceration.  It appears a little improved, but still does not look good.  He has thick and dry skin.   Neurology:  CN II-XII grossly intact, Non focal.   Psych:  TP linear. J/I WNL. Normal speech. Appropriate eye contact and affect.  Skin:  No Rash  Data Review Lab Results  Component Value Date   HGBA1C 12.9 12/01/2017   HGBA1C >15 08/11/2017   HGBA1C 14.0 05/07/2017     Assessment & Plan   1. Left leg cellulitis Concern due to skin break down-he may need debridement or more aggressive management.  Will do  urgent referral.   - doxycycline (VIBRA-TABS) 100 MG tablet; Take 1 tablet (100 mg total) by mouth 2 (two) times daily.  Dispense: 20 tablet; Refill: 0 - AMB referral to wound care center - mupirocin ointment (BACTROBAN) 2 %; Apply bid to AA X 10 days  Dispense: 22 g; Refill: 2 - acetaminophen-codeine (TYLENOL #3) 300-30 MG tablet; Take 1 tablet by mouth every 6 (six) hours as needed for moderate pain.  Dispense: 20 tablet; Refill: 0  2. Type 2 diabetes mellitus with other specified complication, with long-term current use of insulin ([facility]) Uncontrolled.  Continue current regimen-great work on increasing water and no juices!  Keep up the good work on choices such as those!! - Glucose (CBG)   Patient have been counseled extensively about nutrition and exercise  Return in about 2 weeks (around 03/05/2018) for appt with me;  recheck leg.  The patient was given clear instructions to go to ER or return to medical center if symptoms don't improve, worsen or new problems develop. The patient verbalized understanding. The patient was told to call to get lab results if they haven't heard anything in the next week.     Freeman Caldron, PA-C [facility] and [place], [place]   02/19/2018, 9:59 AMPatient ID: [name], male   DOB: 03-Feb-1961, 57 y.o.   MRN: [id number]

## 2018-02-20 ENCOUNTER — Encounter (HOSPITAL_BASED_OUTPATIENT_CLINIC_OR_DEPARTMENT_OTHER): Payer: Self-pay | Attending: Internal Medicine

## 2018-02-20 DIAGNOSIS — L97821 Non-pressure chronic ulcer of other part of left lower leg limited to breakdown of skin: Secondary | ICD-10-CM | POA: Insufficient documentation

## 2018-02-20 DIAGNOSIS — L97811 Non-pressure chronic ulcer of other part of right lower leg limited to breakdown of skin: Secondary | ICD-10-CM | POA: Insufficient documentation

## 2018-02-20 DIAGNOSIS — M109 Gout, unspecified: Secondary | ICD-10-CM | POA: Insufficient documentation

## 2018-02-20 DIAGNOSIS — I10 Essential (primary) hypertension: Secondary | ICD-10-CM | POA: Insufficient documentation

## 2018-02-20 DIAGNOSIS — I890 Lymphedema, not elsewhere classified: Secondary | ICD-10-CM | POA: Insufficient documentation

## 2018-02-20 DIAGNOSIS — E11622 Type 2 diabetes mellitus with other skin ulcer: Secondary | ICD-10-CM | POA: Insufficient documentation

## 2018-02-20 DIAGNOSIS — E1165 Type 2 diabetes mellitus with hyperglycemia: Secondary | ICD-10-CM | POA: Insufficient documentation

## 2018-03-05 ENCOUNTER — Other Ambulatory Visit: Payer: Self-pay | Admitting: Physician Assistant

## 2018-03-05 ENCOUNTER — Ambulatory Visit: Payer: Self-pay | Attending: Family Medicine | Admitting: Physician Assistant

## 2018-03-05 VITALS — BP 144/87 | HR 67 | Temp 98.4°F | Resp 16 | Ht 65.0 in | Wt 283.0 lb

## 2018-03-05 DIAGNOSIS — L03116 Cellulitis of left lower limb: Secondary | ICD-10-CM | POA: Insufficient documentation

## 2018-03-05 DIAGNOSIS — Z794 Long term (current) use of insulin: Secondary | ICD-10-CM | POA: Insufficient documentation

## 2018-03-05 DIAGNOSIS — M25569 Pain in unspecified knee: Secondary | ICD-10-CM | POA: Insufficient documentation

## 2018-03-05 DIAGNOSIS — Z7982 Long term (current) use of aspirin: Secondary | ICD-10-CM | POA: Insufficient documentation

## 2018-03-05 DIAGNOSIS — Z79899 Other long term (current) drug therapy: Secondary | ICD-10-CM | POA: Insufficient documentation

## 2018-03-05 DIAGNOSIS — R05 Cough: Secondary | ICD-10-CM | POA: Insufficient documentation

## 2018-03-05 DIAGNOSIS — I10 Essential (primary) hypertension: Secondary | ICD-10-CM | POA: Insufficient documentation

## 2018-03-05 DIAGNOSIS — E119 Type 2 diabetes mellitus without complications: Secondary | ICD-10-CM | POA: Insufficient documentation

## 2018-03-05 DIAGNOSIS — R059 Cough, unspecified: Secondary | ICD-10-CM

## 2018-03-05 DIAGNOSIS — IMO0001 Reserved for inherently not codable concepts without codable children: Secondary | ICD-10-CM

## 2018-03-05 DIAGNOSIS — M109 Gout, unspecified: Secondary | ICD-10-CM | POA: Insufficient documentation

## 2018-03-05 DIAGNOSIS — E785 Hyperlipidemia, unspecified: Secondary | ICD-10-CM | POA: Insufficient documentation

## 2018-03-05 LAB — GLUCOSE, POCT (MANUAL RESULT ENTRY): POC Glucose: 128 mg/dl — AB (ref 70–99)

## 2018-03-05 MED ORDER — BENZONATATE 100 MG PO CAPS: 200.0000 mg | ORAL_CAPSULE | Freq: Two times a day (BID) | ORAL | 0 refills | Status: DC | PRN

## 2018-03-05 MED FILL — GABAPENTIN 300 MG CAPSULE: 300 | 30 days supply | Qty: 60 | Fill #1

## 2018-03-05 MED FILL — BENZONATATE 100 MG CAP: 100 | 10 days supply | Qty: 40 | Fill #0

## 2018-03-05 NOTE — Progress Notes (Signed)
Pt. Is here for his leg check-up. Pt. Seen the wound care center.

## 2018-03-05 NOTE — Progress Notes (Signed)
Patient ID: [name], male   DOB: 1960-12-12, 57 y.o.   MRN: [id number]   [name], is a 57 y.o. male  MPN:[id number]  MGQ:[id number]  DOB - [birth year]  Subjective:  Chief Complaint and HPI: [name] is a 57 y.o. male here today for recheck of leg cellulitis.  He is now being followed by wound care clinic.  He is doing much better and has an appointment with them tomorrow.  Both legs are wrapped currently.  No f/c.  He has finished the antibiotics.    He still occasionally has a dry cough-esp with allergies right now.  Cough is non-productive.     ROS:   Constitutional:  No f/c, No night sweats, No unexplained weight loss. EENT:  No vision changes, No blurry vision, No hearing changes. No mouth, throat, or ear problems.  Respiratory: some cough, No SOB Cardiac: No CP, no palpitations GI:  No abd pain, No N/V/D. GU: No Urinary s/sx Musculoskeletal: No joint pain Neuro: No headache, no dizziness, no motor weakness.  Skin: No rash Endocrine:  No polydipsia. No polyuria.  Psych: Denies SI/HI  No problems updated.  ALLERGIES: No Known Allergies  PAST MEDICAL HISTORY: Past Medical History:  Diagnosis Date  . Gouty bursitis, elbow  10/18/2015  . Hx of gout   . Hyperlipidemia   . Hypertension   . Knee pain, chronic   . Type II diabetes mellitus ([place])     MEDICATIONS AT HOME: Prior to Admission medications   Medication Sig Start Date End Date Taking? Authorizing Provider  amLODipine (NORVASC) 10 MG tablet Take 1 tablet (10 mg total) by mouth daily. 12/01/17  Yes Charlott Rakes, MD  aspirin 81 MG EC tablet Take 1 tablet (81 mg total) by mouth daily. 12/01/17  Yes Newlin, Charlane Ferretti, MD  atorvastatin (LIPITOR) 40 MG tablet TAKE 1 TABLET BY MOUTH DAILY AT 6 PM. 12/01/17  Yes Newlin, Enobong, MD  Blood Glucose Monitoring Suppl (TRUE METRIX METER) w/Device KIT 1 Device by Does not apply route 3 (three) times daily. 10/18/16  Yes Roxine Whittinghill, Dionne Bucy, PA-C    butalbital-acetaminophen-caffeine (FIORICET, ESGIC) [phone] MG tablet Take 1 tablet by mouth every 6 (six) hours as needed for headache. 08/11/17 08/11/18 Yes Charlott Rakes, MD  cetirizine (ZYRTEC) 10 MG tablet Take 1 tablet (10 mg total) by mouth daily. 12/01/17  Yes Newlin, Enobong, MD  FEROSUL 325 (65 Fe) MG tablet TAKE 1 TABLET BY MOUTH DAILY WITH BREAKFAST. 10/06/17  Yes Charlott Rakes, MD  furosemide (LASIX) 20 MG tablet Take 1 tablet (20 mg total) by mouth daily. 02/05/18  Yes Argentina Donovan, PA-C  gabapentin (NEURONTIN) 300 MG capsule Take 1 capsule (300 mg total) by mouth 2 (two) times daily. 12/01/17  Yes Charlott Rakes, MD  glucose blood (TRUE METRIX BLOOD GLUCOSE TEST) test strip Use as instructed 3 times daily 12/26/16  Yes Newlin, Enobong, MD  insulin aspart protamine- aspart (NOVOLOG MIX 70/30) (70-30) 100 UNIT/ML injection Inject 0.5 mLs (50 Units total) into the skin 2 (two) times daily with a meal. 12/01/17  Yes Newlin, Enobong, MD  lisinopril-hydrochlorothiazide (PRINZIDE,ZESTORETIC) 20-25 MG tablet Take 1 tablet by mouth daily. 12/01/17  Yes Charlott Rakes, MD  metFORMIN (GLUCOPHAGE) 500 MG tablet Take 2 tablets (1,000 mg total) by mouth 2 (two) times daily with a meal. 12/01/17  Yes Charlott Rakes, MD  mupirocin ointment (BACTROBAN) 2 % Apply bid to AA X 10 days 02/19/18  Yes Argentina Donovan, PA-C  traZODone (DESYREL) 50  MG tablet Take 1 tablet (50 mg total) by mouth at bedtime. 12/01/17  Yes Charlott Rakes, MD  TRUEPLUS INSULIN SYRINGE 30G X 5/16" 0.5 ML MISC USE FOR INSULIN INJECTION AS DIRECTED 11/25/17  Yes Charlott Rakes, MD  TRUEPLUS LANCETS 28G MISC USE AS DIRECTED 3 TIMES DAILY. 01/06/18  Yes Charlott Rakes, MD  acetaminophen-codeine (TYLENOL #3) 300-30 MG tablet Take 1 tablet by mouth every 6 (six) hours as needed for moderate pain. Patient not taking: Reported on 03/05/2018 02/19/18   Argentina Donovan, PA-C  benzonatate (TESSALON) 100 MG capsule Take 2 capsules (200 mg  total) by mouth 2 (two) times daily as needed for cough. 03/05/18   Argentina Donovan, PA-C  cephALEXin (KEFLEX) 500 MG capsule Take 1 capsule (500 mg total) by mouth 4 (four) times daily. Patient not taking: Reported on 02/19/2018 12/01/17   Charlott Rakes, MD  colchicine 0.6 MG tablet Take 0.6 mg by mouth daily.    [provider]  methocarbamol (ROBAXIN-750) 750 MG tablet Take 1 tablet (750 mg total) by mouth 2 (two) times daily as needed for muscle spasms. Patient not taking: Reported on 02/19/2018 12/22/17   Charlott Rakes, MD  metoprolol succinate (TOPROL-XL) 50 MG 24 hr tablet Take 1 tablet (50 mg total) by mouth daily. Patient not taking: Reported on 02/19/2018 12/01/17   Charlott Rakes, MD     Objective:  EXAM:   Vitals:   03/05/18 0924  BP: (!) 144/87  Pulse: 67  Resp: 16  Temp: 98.4 F (36.9 C)  TempSrc: Oral  SpO2: 100%  Weight: 283 lb (128.4 kg)  Height: '5\' 5"'$  (1.651 m)    General appearance : A&OX3. NAD. Non-toxic-appearing HEENT: Atraumatic and Normocephalic.  PERRLA. Neck: supple, no JVD. No cervical lymphadenopathy. No thyromegaly Chest/Lungs:  Breathing-non-labored, Good air entry bilaterally, breath sounds normal without rales, rhonchi, or wheezing  CVS: S1 S2 regular, no murmurs, gallops, rubs  Extremities: Bilateral Lower Ext shows no edema, both legs are warm to touch with = pulse throughout.  Both legs wrapped by wound care and were not uncovered today as he is doing well and sees them tomorrow.   Neurology:  CN II-XII grossly intact, Non focal.   Psych:  TP linear. J/I WNL. Normal speech. Appropriate eye contact and affect.  Skin:  No Rash  Data Review Lab Results  Component Value Date   HGBA1C 12.9 12/01/2017   HGBA1C >15 08/11/2017   HGBA1C 14.0 05/07/2017     Assessment & Plan   1. Cellulitis of left lower extremity Much improving-now followed by wound clinic  2. IDDM (insulin dependent diabetes mellitus) ([place]) Blood sugar is good today.   Continue current regimen.  Improve diet.   - Glucose (CBG)  3. Cough No sign of infection.  Just finished Doxy.   - benzonatate (TESSALON) 100 MG capsule; Take 2 capsules (200 mg total) by mouth 2 (two) times daily as needed for cough.  Dispense: 40 capsule; Refill: 0     Patient have been counseled extensively about nutrition and exercise  Return for keep 03/23/2018 with Dr Margarita Rana.  The patient was given clear instructions to go to ER or return to medical center if symptoms don't improve, worsen or new problems develop. The patient verbalized understanding. The patient was told to call to get lab results if they haven't heard anything in the next week.     Freeman Caldron, PA-C [facility] and [facility] [place], [place]   03/05/2018, 9:41  AM   

## 2018-03-06 ENCOUNTER — Ambulatory Visit: Payer: Self-pay | Admitting: Family Medicine

## 2018-03-09 MED FILL — ?CETIRIZINE HCL 10 MG TABLE: 10 | 30 days supply | Qty: 30 | Fill #2

## 2018-03-09 MED FILL — AMLODIPINE BESYLATE 10 MG T: 10 | 30 days supply | Qty: 30 | Fill #5

## 2018-03-09 MED FILL — METOPROLOL SUCCINATE ER 50: 50 | 30 days supply | Qty: 30 | Fill #1

## 2018-03-09 MED FILL — ?ATORVASTATIN 40MG TABLET: 40 | 30 days supply | Qty: 30 | Fill #5

## 2018-03-09 MED FILL — LISINOPRIL-HCTZ 20-25 MG TA: 20-25 | 30 days supply | Qty: 30 | Fill #3

## 2018-03-23 ENCOUNTER — Ambulatory Visit: Payer: Self-pay | Attending: Family Medicine | Admitting: Family Medicine

## 2018-03-23 ENCOUNTER — Encounter: Payer: Self-pay | Admitting: Family Medicine

## 2018-03-23 ENCOUNTER — Other Ambulatory Visit: Payer: Self-pay | Admitting: Family Medicine

## 2018-03-23 VITALS — BP 144/86 | HR 93 | Temp 98.0°F | Ht 65.0 in | Wt 287.6 lb

## 2018-03-23 DIAGNOSIS — M109 Gout, unspecified: Secondary | ICD-10-CM | POA: Insufficient documentation

## 2018-03-23 DIAGNOSIS — L03116 Cellulitis of left lower limb: Secondary | ICD-10-CM | POA: Insufficient documentation

## 2018-03-23 DIAGNOSIS — E785 Hyperlipidemia, unspecified: Secondary | ICD-10-CM | POA: Insufficient documentation

## 2018-03-23 DIAGNOSIS — Z794 Long term (current) use of insulin: Secondary | ICD-10-CM

## 2018-03-23 DIAGNOSIS — E1169 Type 2 diabetes mellitus with other specified complication: Secondary | ICD-10-CM

## 2018-03-23 DIAGNOSIS — G8929 Other chronic pain: Secondary | ICD-10-CM | POA: Insufficient documentation

## 2018-03-23 DIAGNOSIS — E1149 Type 2 diabetes mellitus with other diabetic neurological complication: Secondary | ICD-10-CM

## 2018-03-23 DIAGNOSIS — M170 Bilateral primary osteoarthritis of knee: Secondary | ICD-10-CM | POA: Insufficient documentation

## 2018-03-23 DIAGNOSIS — Z79899 Other long term (current) drug therapy: Secondary | ICD-10-CM | POA: Insufficient documentation

## 2018-03-23 DIAGNOSIS — M7989 Other specified soft tissue disorders: Secondary | ICD-10-CM | POA: Insufficient documentation

## 2018-03-23 DIAGNOSIS — Z1211 Encounter for screening for malignant neoplasm of colon: Secondary | ICD-10-CM

## 2018-03-23 DIAGNOSIS — R600 Localized edema: Secondary | ICD-10-CM

## 2018-03-23 DIAGNOSIS — M25561 Pain in right knee: Secondary | ICD-10-CM | POA: Insufficient documentation

## 2018-03-23 DIAGNOSIS — M25562 Pain in left knee: Secondary | ICD-10-CM | POA: Insufficient documentation

## 2018-03-23 DIAGNOSIS — L03115 Cellulitis of right lower limb: Secondary | ICD-10-CM | POA: Insufficient documentation

## 2018-03-23 DIAGNOSIS — I10 Essential (primary) hypertension: Secondary | ICD-10-CM

## 2018-03-23 DIAGNOSIS — Z7982 Long term (current) use of aspirin: Secondary | ICD-10-CM | POA: Insufficient documentation

## 2018-03-23 LAB — GLUCOSE, POCT (MANUAL RESULT ENTRY): POC GLUCOSE: 199 mg/dL — AB (ref 70–99)

## 2018-03-23 LAB — POCT GLYCOSYLATED HEMOGLOBIN (HGB A1C): Hemoglobin A1C: 12.3

## 2018-03-23 MED ORDER — LISINOPRIL-HYDROCHLOROTHIAZIDE 20-25 MG PO TABS: 1.0000 | ORAL_TABLET | Freq: Every day | ORAL | 3 refills | Status: DC

## 2018-03-23 MED ORDER — "INSULIN SYRINGE/NEEDLE 28G X 1/2"" 1 ML MISC": 1.0000 | Freq: Two times a day (BID) | 12 refills | Status: DC

## 2018-03-23 MED ORDER — INSULIN ASPART PROT & ASPART (70-30 MIX) 100 UNIT/ML ~~LOC~~ SUSP: 55.0000 [IU] | Freq: Two times a day (BID) | SUBCUTANEOUS | 3 refills | Status: DC

## 2018-03-23 MED ORDER — ATORVASTATIN CALCIUM 40 MG PO TABS: ORAL_TABLET | ORAL | 3 refills | Status: DC

## 2018-03-23 MED ORDER — AMLODIPINE BESYLATE 10 MG PO TABS: 10.0000 mg | ORAL_TABLET | Freq: Every day | ORAL | 3 refills | Status: DC

## 2018-03-23 MED ORDER — GABAPENTIN 300 MG PO CAPS: 300.0000 mg | ORAL_CAPSULE | Freq: Two times a day (BID) | ORAL | 3 refills | Status: DC

## 2018-03-23 MED ORDER — METOPROLOL SUCCINATE ER 50 MG PO TB24: 50.0000 mg | ORAL_TABLET | Freq: Every day | ORAL | 3 refills | Status: DC

## 2018-03-23 MED ORDER — METFORMIN HCL 500 MG PO TABS: 1000.0000 mg | ORAL_TABLET | Freq: Two times a day (BID) | ORAL | 3 refills | Status: DC

## 2018-03-23 MED FILL — TRUEPLUS SYR 1ML 30GX5/16": 30G X 5/16" | 30 days supply | Qty: 60 | Fill #0

## 2018-03-23 MED FILL — TRUEPLUS SYR 1ML 30GX5/16: 30G X 5/16" | 30 days supply | Qty: 60 | Fill #0

## 2018-03-23 MED FILL — !NOVOLOG MIX 70/30 VIAL: 70-30/ML | 27 days supply | Qty: 30 | Fill #0

## 2018-03-23 MED FILL — ?METFORMIN HCL 500MG TABLET: 500 | 30 days supply | Qty: 120 | Fill #0

## 2018-03-23 NOTE — Patient Instructions (Signed)
Diabetes Mellitus and Nutrition When you have diabetes (diabetes mellitus), it is very important to have healthy eating habits because your blood sugar (glucose) levels are greatly affected by what you eat and drink. Eating healthy foods in the appropriate amounts, at about the same times every day, can help you:  Control your blood glucose.  Lower your risk of heart disease.  Improve your blood pressure.  Reach or maintain a healthy weight.  Every person with diabetes is different, and each person has different needs for a meal plan. Your health care provider may recommend that you work with a diet and nutrition specialist (dietitian) to make a meal plan that is best for you. Your meal plan may vary depending on factors such as:  The calories you need.  The medicines you take.  Your weight.  Your blood glucose, blood pressure, and cholesterol levels.  Your activity level.  Other health conditions you have, such as heart or kidney disease.  How do carbohydrates affect me? Carbohydrates affect your blood glucose level more than any other type of food. Eating carbohydrates naturally increases the amount of glucose in your blood. Carbohydrate counting is a method for keeping track of how many carbohydrates you eat. Counting carbohydrates is important to keep your blood glucose at a healthy level, especially if you use insulin or take certain oral diabetes medicines. It is important to know how many carbohydrates you can safely have in each meal. This is different for every person. Your dietitian can help you calculate how many carbohydrates you should have at each meal and for snack. Foods that contain carbohydrates include:  Bread, cereal, rice, pasta, and crackers.  Potatoes and corn.  Peas, beans, and lentils.  Milk and yogurt.  Fruit and juice.  Desserts, such as cakes, cookies, ice cream, and candy.  How does alcohol affect me? Alcohol can cause a sudden decrease in blood  glucose (hypoglycemia), especially if you use insulin or take certain oral diabetes medicines. Hypoglycemia can be a life-threatening condition. Symptoms of hypoglycemia (sleepiness, dizziness, and confusion) are similar to symptoms of having too much alcohol. If your health care provider says that alcohol is safe for you, follow these guidelines:  Limit alcohol intake to no more than 1 drink per day for nonpregnant women and 2 drinks per day for men. One drink equals 12 oz of beer, 5 oz of wine, or 1 oz of hard liquor.  Do not drink on an empty stomach.  Keep yourself hydrated with water, diet soda, or unsweetened iced tea.  Keep in mind that regular soda, juice, and other mixers may contain a lot of sugar and must be counted as carbohydrates.  What are tips for following this plan? Reading food labels  Start by checking the serving size on the label. The amount of calories, carbohydrates, fats, and other nutrients listed on the label are based on one serving of the food. Many foods contain more than one serving per package.  Check the total grams (g) of carbohydrates in one serving. You can calculate the number of servings of carbohydrates in one serving by dividing the total carbohydrates by 15. For example, if a food has 30 g of total carbohydrates, it would be equal to 2 servings of carbohydrates.  Check the number of grams (g) of saturated and trans fats in one serving. Choose foods that have low or no amount of these fats.  Check the number of milligrams (mg) of sodium in one serving. Most people   should limit total sodium intake to less than 2,300 mg per day.  Always check the nutrition information of foods labeled as "low-fat" or "nonfat". These foods may be higher in added sugar or refined carbohydrates and should be avoided.  Talk to your dietitian to identify your daily goals for nutrients listed on the label. Shopping  Avoid buying canned, premade, or processed foods. These  foods tend to be high in fat, sodium, and added sugar.  Shop around the outside edge of the grocery store. This includes fresh fruits and vegetables, bulk grains, fresh meats, and fresh dairy. Cooking  Use low-heat cooking methods, such as baking, instead of high-heat cooking methods like deep frying.  Cook using healthy oils, such as olive, canola, or sunflower oil.  Avoid cooking with butter, cream, or high-fat meats. Meal planning  Eat meals and snacks regularly, preferably at the same times every day. Avoid going long periods of time without eating.  Eat foods high in fiber, such as fresh fruits, vegetables, beans, and whole grains. Talk to your dietitian about how many servings of carbohydrates you can eat at each meal.  Eat 4-6 ounces of lean protein each day, such as lean meat, chicken, fish, eggs, or tofu. 1 ounce is equal to 1 ounce of meat, chicken, or fish, 1 egg, or 1/4 cup of tofu.  Eat some foods each day that contain healthy fats, such as avocado, nuts, seeds, and fish. Lifestyle   Check your blood glucose regularly.  Exercise at least 30 minutes 5 or more days each week, or as told by your health care provider.  Take medicines as told by your health care provider.  Do not use any products that contain nicotine or tobacco, such as cigarettes and e-cigarettes. If you need help quitting, ask your health care provider.  Work with a counselor or diabetes educator to identify strategies to manage stress and any emotional and social challenges. What are some questions to ask my health care provider?  Do I need to meet with a diabetes educator?  Do I need to meet with a dietitian?  What number can I call if I have questions?  When are the best times to check my blood glucose? Where to find more information:  American Diabetes Association: diabetes.org/food-and-fitness/food  Academy of Nutrition and Dietetics:  www.eatright.org/resources/health/diseases-and-conditions/diabetes  National Institute of Diabetes and Digestive and Kidney Diseases (NIH): www.niddk.nih.gov/health-information/diabetes/overview/diet-eating-physical-activity Summary  A healthy meal plan will help you control your blood glucose and maintain a healthy lifestyle.  Working with a diet and nutrition specialist (dietitian) can help you make a meal plan that is best for you.  Keep in mind that carbohydrates and alcohol have immediate effects on your blood glucose levels. It is important to count carbohydrates and to use alcohol carefully. This information is not intended to replace advice given to you by your health care provider. Make sure you discuss any questions you have with your health care provider. Document Released: 08/01/2005 Document Revised: 12/09/2016 Document Reviewed: 12/09/2016 Elsevier Interactive Patient Education  2018 Else[employer] Inc.  

## 2018-03-23 NOTE — Progress Notes (Signed)
Subjective:  Patient ID: [name], male    DOB: 08/16/1961  Age: 57 y.o. MRN: [id number]  CC: Diabetes   HPI [name]  is a 57 year old male with a history of type 2 diabetes mellitus(A1c 12.3), hypertension, hyperlipidemia, gout, osteoarthritis of the knee who presents today for follow-up of Hypertension and diabetes mellitus. He endorses compliance with his insulin but his A1c is 12.3 and was 12.9 previously.  Compliance with a diabetic diet cannot be ascertained.  He denies hypoglycemia or numbness in his extremities.  Doing well on his antihypertensive and his statin and denies adverse effects from his medications. He has had no recent gout flares. He ambulates with a walker due to osteoarthritis of both knees. At his last visit he was treated for bilateral lower extremity cellulitis and edema was referred to the wound care and has noticed significant improvement in his lower extremities with improvement in edema and he now wears compression stockings; he has been discharged by the wound care clinic. He has no acute concerns today.  Past Medical History:  Diagnosis Date  . Gouty bursitis, elbow  10/18/2015  . Hx of gout   . Hyperlipidemia   . Hypertension   . Knee pain, chronic   . Type II diabetes mellitus ([place])     Past Surgical History:  Procedure Laterality Date  . KNEE ARTHROSCOPY Bilateral 12/29/2015   Procedure: ARTHROSCOPY KNEE BILATERAL WITH  PARTIAL MEDIAL AND LATERAL MENISCECTOMY AND FOUR COMPARTMENT SYNOVECTOMY , CHONDRALPLASTIES, PATELLA-FEMORAL CHONDRALPLASTIES BILATERALLY;  Surgeon: Dorna Leitz, MD;  Location: [facility];  Service: Orthopedics;  Laterality: Bilateral;  . NO PAST SURGERIES      No Known Allergies   Outpatient Medications Prior to Visit  Medication Sig Dispense Refill  . aspirin 81 MG EC tablet Take 1 tablet (81 mg total) by mouth daily. 90 tablet 1  . benzonatate (TESSALON) 100 MG capsule Take 2 capsules (200 mg total) by mouth 2  (two) times daily as needed for cough. 40 capsule 0  . Blood Glucose Monitoring Suppl (TRUE METRIX METER) w/Device KIT 1 Device by Does not apply route 3 (three) times daily. 1 kit 0  . butalbital-acetaminophen-caffeine (FIORICET, ESGIC) 50-325-40 MG tablet Take 1 tablet by mouth every 6 (six) hours as needed for headache. 30 tablet 0  . cetirizine (ZYRTEC) 10 MG tablet Take 1 tablet (10 mg total) by mouth daily. 30 tablet 3  . colchicine 0.6 MG tablet Take 0.6 mg by mouth daily.    . FEROSUL 325 (65 Fe) MG tablet TAKE 1 TABLET BY MOUTH DAILY WITH BREAKFAST. 30 tablet 2  . furosemide (LASIX) 20 MG tablet Take 1 tablet (20 mg total) by mouth daily. 20 tablet 0  . glucose blood (TRUE METRIX BLOOD GLUCOSE TEST) test strip Use as instructed 3 times daily 100 each 12  . methocarbamol (ROBAXIN-750) 750 MG tablet Take 1 tablet (750 mg total) by mouth 2 (two) times daily as needed for muscle spasms. 60 tablet 1  . mupirocin ointment (BACTROBAN) 2 % Apply bid to AA X 10 days 22 g 2  . traZODone (DESYREL) 50 MG tablet Take 1 tablet (50 mg total) by mouth at bedtime. 30 tablet 3  . TRUEPLUS INSULIN SYRINGE 30G X 5/16" 0.5 ML MISC USE FOR INSULIN INJECTION AS DIRECTED 100 each 2  . TRUEPLUS LANCETS 28G MISC USE AS DIRECTED 3 TIMES DAILY. 100 each 2  . amLODipine (NORVASC) 10 MG tablet Take 1 tablet (10 mg total) by  mouth daily. 30 tablet 5  . atorvastatin (LIPITOR) 40 MG tablet TAKE 1 TABLET BY MOUTH DAILY AT 6 PM. 30 tablet 5  . cephALEXin (KEFLEX) 500 MG capsule Take 1 capsule (500 mg total) by mouth 4 (four) times daily. 40 capsule 0  . gabapentin (NEURONTIN) 300 MG capsule Take 1 capsule (300 mg total) by mouth 2 (two) times daily. 60 capsule 3  . insulin aspart protamine- aspart (NOVOLOG MIX 70/30) (70-30) 100 UNIT/ML injection Inject 0.5 mLs (50 Units total) into the skin 2 (two) times daily with a meal. 60 mL 3  . lisinopril-hydrochlorothiazide (PRINZIDE,ZESTORETIC) 20-25 MG tablet Take 1 tablet by  mouth daily. 30 tablet 3  . metFORMIN (GLUCOPHAGE) 500 MG tablet Take 2 tablets (1,000 mg total) by mouth 2 (two) times daily with a meal. 120 tablet 3  . metoprolol succinate (TOPROL-XL) 50 MG 24 hr tablet Take 1 tablet (50 mg total) by mouth daily. 30 tablet 3  . acetaminophen-codeine (TYLENOL #3) 300-30 MG tablet Take 1 tablet by mouth every 6 (six) hours as needed for moderate pain. (Patient not taking: Reported on 03/05/2018) 20 tablet 0   No facility-administered medications prior to visit.     ROS Review of Systems  Constitutional: Negative for activity change and appetite change.  HENT: Negative for sinus pressure and sore throat.   Eyes: Negative for visual disturbance.  Respiratory: Negative for cough, chest tightness and shortness of breath.   Cardiovascular: Positive for leg swelling. Negative for chest pain.  Gastrointestinal: Negative for abdominal distention, abdominal pain, constipation and diarrhea.  Endocrine: Negative.   Genitourinary: Negative for dysuria.  Musculoskeletal: Negative for joint swelling and myalgias.  Skin: Negative for rash.  Allergic/Immunologic: Negative.   Neurological: Negative for weakness, light-headedness and numbness.  Psychiatric/Behavioral: Negative for dysphoric mood and suicidal ideas.    Objective:  BP (!) 144/86   Pulse 93   Temp 98 F (36.7 C) (Oral)   Ht 5' 5" (1.651 m)   Wt 287 lb 9.6 oz (130.5 kg)   SpO2 97%   BMI 47.86 kg/m   BP/Weight 03/23/2018 1/61/0960 02/20/4097  Systolic BP 119 147 829  Diastolic BP 86 87 83  Wt. (Lbs) 287.6 283 286  BMI 47.86 47.09 47.59      Physical Exam  Constitutional: He is oriented to person, place, and time. He appears well-developed and well-nourished.  Cardiovascular: Normal rate, normal heart sounds and intact distal pulses.  No murmur heard. Pulmonary/Chest: Effort normal and breath sounds normal. He has no wheezes. He has no rales. He exhibits no tenderness.  Abdominal: Soft. Bowel  sounds are normal. He exhibits no distension and no mass. There is no tenderness.  Musculoskeletal: Normal range of motion. He exhibits edema (2+ non pitting b/l pedal edema).  Neurological: He is alert and oriented to person, place, and time.  Skin: Skin is warm and dry.    Lab Results  Component Value Date   HGBA1C 12.3 03/23/2018     Assessment & Plan:   1. Type 2 diabetes mellitus with other specified complication, with long-term current use of insulin (HCC) Controlled with A1c of 12.3 Increase Lantus to 50 units twice daily Continue diabetic diet, lifestyle modifications Referred to endocrine for optimization of management - POCT glucose (manual entry) - POCT glycosylated hemoglobin (Hb A1C) - Ambulatory referral to Endocrinology - insulin aspart protamine- aspart (NOVOLOG MIX 70/30) (70-30) 100 UNIT/ML injection; Inject 0.55 mLs (55 Units total) into the skin 2 (two) times daily with  a meal.  Dispense: 60 mL; Refill: 3 - Microalbumin/Creatinine Ratio, Urine - metFORMIN (GLUCOPHAGE) 500 MG tablet; Take 2 tablets (1,000 mg total) by mouth 2 (two) times daily with a meal.  Dispense: 120 tablet; Refill: 3 - metoprolol succinate (TOPROL-XL) 50 MG 24 hr tablet; Take 1 tablet (50 mg total) by mouth daily.  Dispense: 30 tablet; Refill: 3 - amLODipine (NORVASC) 10 MG tablet; Take 1 tablet (10 mg total) by mouth daily.  Dispense: 30 tablet; Refill: 3 - atorvastatin (LIPITOR) 40 MG tablet; TAKE 1 TABLET BY MOUTH DAILY AT 6 PM.  Dispense: 30 tablet; Refill: 3  2. Other diabetic neurological complication associated with type 2 diabetes mellitus (HCC) Stable - gabapentin (NEURONTIN) 300 MG capsule; Take 1 capsule (300 mg total) by mouth 2 (two) times daily.  Dispense: 60 capsule; Refill: 3  3. Essential hypertension Slightly elevated, No regimen change - lisinopril-hydrochlorothiazide (PRINZIDE,ZESTORETIC) 20-25 MG tablet; Take 1 tablet by mouth daily.  Dispense: 30 tablet; Refill: 3 -  metoprolol succinate (TOPROL-XL) 50 MG 24 hr tablet; Take 1 tablet (50 mg total) by mouth daily.  Dispense: 30 tablet; Refill: 3 - amLODipine (NORVASC) 10 MG tablet; Take 1 tablet (10 mg total) by mouth daily.  Dispense: 30 tablet; Refill: 3 - atorvastatin (LIPITOR) 40 MG tablet; TAKE 1 TABLET BY MOUTH DAILY AT 6 PM.  Dispense: 30 tablet; Refill: 3  4. Screening for colon cancer - Ambulatory referral to Gastroenterology  5. Pedal edema Continue compression stockings Elevate feet   Meds ordered this encounter  Medications  . insulin aspart protamine- aspart (NOVOLOG MIX 70/30) (70-30) 100 UNIT/ML injection    Sig: Inject 0.55 mLs (55 Units total) into the skin 2 (two) times daily with a meal.    Dispense:  60 mL    Refill:  3    Discontinue previous dose  . gabapentin (NEURONTIN) 300 MG capsule    Sig: Take 1 capsule (300 mg total) by mouth 2 (two) times daily.    Dispense:  60 capsule    Refill:  3  . lisinopril-hydrochlorothiazide (PRINZIDE,ZESTORETIC) 20-25 MG tablet    Sig: Take 1 tablet by mouth daily.    Dispense:  30 tablet    Refill:  3  . metFORMIN (GLUCOPHAGE) 500 MG tablet    Sig: Take 2 tablets (1,000 mg total) by mouth 2 (two) times daily with a meal.    Dispense:  120 tablet    Refill:  3    Discontinue previous dose  . metoprolol succinate (TOPROL-XL) 50 MG 24 hr tablet    Sig: Take 1 tablet (50 mg total) by mouth daily.    Dispense:  30 tablet    Refill:  3    Discontinue previous dose  . amLODipine (NORVASC) 10 MG tablet    Sig: Take 1 tablet (10 mg total) by mouth daily.    Dispense:  30 tablet    Refill:  3  . atorvastatin (LIPITOR) 40 MG tablet    Sig: TAKE 1 TABLET BY MOUTH DAILY AT 6 PM.    Dispense:  30 tablet    Refill:  3  . INS SYRINGE/NEEDLE 1CC/28G (TRUEPLUS INSULIN SYRINGE) 28G X 1/2" 1 ML MISC    Sig: 1 each by Does not apply route 2 (two) times daily.    Dispense:  60 each    Refill:  12    Follow-up: Return in about 3 months (around  06/23/2018) for follow up of chronic medical conditions.   [place]  Margarita Rana MD

## 2018-03-24 LAB — MICROALBUMIN / CREATININE URINE RATIO
Creatinine, Urine: 94.6 mg/dL
MICROALB/CREAT RATIO: 50.5 mg/g{creat} — AB (ref 0.0–30.0)
MICROALBUM., U, RANDOM: 47.8 ug/mL

## 2018-03-31 ENCOUNTER — Ambulatory Visit: Payer: Self-pay | Attending: Family Medicine

## 2018-04-06 MED FILL — LISINOPRIL-HCTZ 20-25 MG TA: 20-25 | 30 days supply | Qty: 30 | Fill #0

## 2018-04-06 MED FILL — ?CETIRIZINE HCL 10 MG TABLE: 10 | 30 days supply | Qty: 30 | Fill #3

## 2018-04-06 MED FILL — ?ATORVASTATIN 40MG TABLET: 40 | 30 days supply | Qty: 30 | Fill #0

## 2018-04-06 MED FILL — METOPROLOL SUCCINATE ER 50: 50 | 30 days supply | Qty: 30 | Fill #2

## 2018-04-06 MED FILL — GABAPENTIN 300 MG CAPSULE: 300 | 30 days supply | Qty: 60 | Fill #2

## 2018-04-06 MED FILL — AMLODIPINE BESYLATE 10 MG T: 10 | 30 days supply | Qty: 30 | Fill #0

## 2018-04-16 ENCOUNTER — Encounter: Payer: Self-pay | Admitting: Internal Medicine

## 2018-04-17 MED FILL — !NOVOLOG MIX 70/30 VIAL: 70-30/ML | 27 days supply | Qty: 30 | Fill #1

## 2018-04-20 ENCOUNTER — Other Ambulatory Visit: Payer: Self-pay

## 2018-04-20 MED ORDER — GLUCOSE BLOOD VI STRP: 1.0000 | ORAL_STRIP | Freq: Three times a day (TID) | 12 refills | Status: DC

## 2018-04-20 MED FILL — TRUE METRIX TEST STRIP: 30 days supply | Qty: 100 | Fill #0

## 2018-04-21 MED FILL — TRUEplus LANCETS 28G MISC: 30 days supply | Qty: 100 | Fill #1

## 2018-05-05 ENCOUNTER — Other Ambulatory Visit: Payer: Self-pay | Admitting: Family Medicine

## 2018-05-05 MED FILL — METOPROLOL SUCCINATE ER 50: 50 | 30 days supply | Qty: 30 | Fill #3

## 2018-05-05 MED FILL — ?METFORMIN HCL 500MG TABLET: 500 | 30 days supply | Qty: 120 | Fill #1

## 2018-05-05 MED FILL — ?ATORVASTATIN 40MG TABLET: 40 | 30 days supply | Qty: 30 | Fill #1

## 2018-05-05 MED FILL — GABAPENTIN 300 MG CAPSULE: 300 | 30 days supply | Qty: 60 | Fill #3

## 2018-05-05 MED FILL — AMLODIPINE BESYLATE 10 MG T: 10 | 30 days supply | Qty: 30 | Fill #1

## 2018-05-05 MED FILL — LISINOPRIL-HCTZ 20-25 MG TA: 20-25 | 30 days supply | Qty: 30 | Fill #1

## 2018-05-05 MED FILL — ?CETIRIZINE HCL 10 MG TABLE: 10 | 30 days supply | Qty: 30 | Fill #0

## 2018-05-18 MED FILL — !NOVOLOG MIX 70/30 VIAL: 70-30/ML | 27 days supply | Qty: 30 | Fill #2

## 2018-06-09 MED FILL — AMLODIPINE BESYLATE 10 MG T: 10 | 30 days supply | Qty: 30 | Fill #2

## 2018-06-09 MED FILL — ?ATORVASTATIN 40MG TABLET: 40 | 30 days supply | Qty: 30 | Fill #2

## 2018-06-09 MED FILL — METOPROLOL SUCCINATE ER 50: 50 | 30 days supply | Qty: 30 | Fill #0

## 2018-06-09 MED FILL — ?METFORMIN HCL 500MG TABS: 500 | 30 days supply | Qty: 120 | Fill #2

## 2018-06-09 MED FILL — ?CETIRIZINE HCL 10 MG TABLE: 10 | 30 days supply | Qty: 30 | Fill #1

## 2018-06-09 MED FILL — ?LISINOPRIL-HCTZ 20/25 TAB: 20-25 | 30 days supply | Qty: 30 | Fill #2

## 2018-06-09 MED FILL — GABAPENTIN 300 MG CAPSULE: 300 | 30 days supply | Qty: 60 | Fill #0

## 2018-06-09 MED FILL — !NOVOLOG MIX 70/30 VIAL: 70-30/ML | 27 days supply | Qty: 30 | Fill #3

## 2018-06-23 ENCOUNTER — Encounter: Payer: Self-pay | Admitting: Family Medicine

## 2018-06-23 ENCOUNTER — Ambulatory Visit: Payer: Medicare HMO | Attending: Family Medicine | Admitting: Family Medicine

## 2018-06-23 VITALS — BP 132/88 | HR 86 | Temp 97.9°F | Ht 65.0 in | Wt 287.8 lb

## 2018-06-23 DIAGNOSIS — Z1211 Encounter for screening for malignant neoplasm of colon: Secondary | ICD-10-CM

## 2018-06-23 DIAGNOSIS — E1142 Type 2 diabetes mellitus with diabetic polyneuropathy: Secondary | ICD-10-CM | POA: Insufficient documentation

## 2018-06-23 DIAGNOSIS — R600 Localized edema: Secondary | ICD-10-CM | POA: Insufficient documentation

## 2018-06-23 DIAGNOSIS — E119 Type 2 diabetes mellitus without complications: Secondary | ICD-10-CM

## 2018-06-23 DIAGNOSIS — M109 Gout, unspecified: Secondary | ICD-10-CM | POA: Diagnosis not present

## 2018-06-23 DIAGNOSIS — Z79899 Other long term (current) drug therapy: Secondary | ICD-10-CM | POA: Insufficient documentation

## 2018-06-23 DIAGNOSIS — I10 Essential (primary) hypertension: Secondary | ICD-10-CM | POA: Diagnosis not present

## 2018-06-23 DIAGNOSIS — Z794 Long term (current) use of insulin: Secondary | ICD-10-CM | POA: Diagnosis not present

## 2018-06-23 DIAGNOSIS — M170 Bilateral primary osteoarthritis of knee: Secondary | ICD-10-CM | POA: Diagnosis not present

## 2018-06-23 DIAGNOSIS — E785 Hyperlipidemia, unspecified: Secondary | ICD-10-CM | POA: Insufficient documentation

## 2018-06-23 DIAGNOSIS — Z7982 Long term (current) use of aspirin: Secondary | ICD-10-CM | POA: Insufficient documentation

## 2018-06-23 DIAGNOSIS — E1149 Type 2 diabetes mellitus with other diabetic neurological complication: Secondary | ICD-10-CM | POA: Diagnosis not present

## 2018-06-23 DIAGNOSIS — E1169 Type 2 diabetes mellitus with other specified complication: Secondary | ICD-10-CM | POA: Diagnosis not present

## 2018-06-23 LAB — POCT GLYCOSYLATED HEMOGLOBIN (HGB A1C): HbA1c, POC (controlled diabetic range): 9.5 % — AB (ref 0.0–7.0)

## 2018-06-23 LAB — GLUCOSE, POCT (MANUAL RESULT ENTRY): POC GLUCOSE: 161 mg/dL — AB (ref 70–99)

## 2018-06-23 MED ORDER — FUROSEMIDE 20 MG PO TABS: 20.0000 mg | ORAL_TABLET | Freq: Every day | ORAL | 3 refills | Status: DC

## 2018-06-23 MED ORDER — GABAPENTIN 300 MG PO CAPS: 300.0000 mg | ORAL_CAPSULE | Freq: Two times a day (BID) | ORAL | 3 refills | Status: DC

## 2018-06-23 MED ORDER — LISINOPRIL-HYDROCHLOROTHIAZIDE 20-25 MG PO TABS: 1.0000 | ORAL_TABLET | Freq: Every day | ORAL | 3 refills | Status: DC

## 2018-06-23 MED ORDER — ATORVASTATIN CALCIUM 40 MG PO TABS: ORAL_TABLET | ORAL | 3 refills | Status: DC

## 2018-06-23 MED ORDER — METFORMIN HCL 500 MG PO TABS: 1000.0000 mg | ORAL_TABLET | Freq: Two times a day (BID) | ORAL | 3 refills | Status: DC

## 2018-06-23 MED ORDER — METOPROLOL SUCCINATE ER 50 MG PO TB24: 50.0000 mg | ORAL_TABLET | Freq: Every day | ORAL | 3 refills | Status: DC

## 2018-06-23 MED ORDER — TETANUS-DIPHTH-ACELL PERTUSSIS 5-2.5-18.5 LF-MCG/0.5 IM SUSP: 0.5000 mL | Freq: Once | INTRAMUSCULAR | 0 refills | Status: AC

## 2018-06-23 MED ORDER — INSULIN ASPART PROT & ASPART (70-30 MIX) 100 UNIT/ML ~~LOC~~ SUSP: 60.0000 [IU] | Freq: Two times a day (BID) | SUBCUTANEOUS | 3 refills | Status: DC

## 2018-06-23 MED FILL — FUROSEMIDE 20 MG TABLET: 20 | 30 days supply | Qty: 30 | Fill #0

## 2018-06-23 NOTE — Progress Notes (Signed)
Subjective:  Patient ID: [name], male    DOB: 26-May-1961  Age: 57 y.o. MRN: [id number]  CC: Diabetes   HPI [name]  is a 57 year old male with a history of type 2 diabetes mellitus(A1c 9.5), hypertension, hyperlipidemia, gout, osteoarthritis of the knee who presents today for follow-up of Hypertension and diabetes mellitus. His A1c is 9.5 which has trended down from 12.3 previously and he endorses compliance with his insulin and a diabetic diet but he has not been exercising.  Denies blurry vision but his neuropathy is controlled on gabapentin. He has been compliant with his antihypertensive but seems not to be taking metoprolol and his blood pressure is controlled; doing well on his statin with no complaints of myalgia. He is concerned about bilateral pedal edema even though he uses compression stockings.  Denies excessive sodium ingestion. He is knees hurt and he has to ambulate with the aid of a walker; pain is described as moderate to severe. Knee x-rays from 11/2015 revealed advanced and progressive tricompartmental degenerative changes bilaterally.  He currently ambulates with the aid of a walker.  Past Medical History:  Diagnosis Date  . Gouty bursitis, elbow  10/18/2015  . Hx of gout   . Hyperlipidemia   . Hypertension   . Knee pain, chronic   . Type II diabetes mellitus ([place])     Past Surgical History:  Procedure Laterality Date  . KNEE ARTHROSCOPY Bilateral 12/29/2015   Procedure: ARTHROSCOPY KNEE BILATERAL WITH  PARTIAL MEDIAL AND LATERAL MENISCECTOMY AND FOUR COMPARTMENT SYNOVECTOMY , CHONDRALPLASTIES, PATELLA-FEMORAL CHONDRALPLASTIES BILATERALLY;  Surgeon: Dorna Leitz, MD;  Location: [facility];  Service: Orthopedics;  Laterality: Bilateral;  . NO PAST SURGERIES      No Known Allergies   Outpatient Medications Prior to Visit  Medication Sig Dispense Refill  . aspirin 81 MG EC tablet Take 1 tablet (81 mg total) by mouth daily. 90 tablet 1  . cetirizine  (ZYRTEC) 10 MG tablet TAKE 1 TABLET (10 MG TOTAL) BY MOUTH DAILY. 30 tablet 3  . FEROSUL 325 (65 Fe) MG tablet TAKE 1 TABLET BY MOUTH DAILY WITH BREAKFAST. 30 tablet 2  . traZODone (DESYREL) 50 MG tablet Take 1 tablet (50 mg total) by mouth at bedtime. 30 tablet 3  . amLODipine (NORVASC) 10 MG tablet Take 1 tablet (10 mg total) by mouth daily. 30 tablet 3  . atorvastatin (LIPITOR) 40 MG tablet TAKE 1 TABLET BY MOUTH DAILY AT 6 PM. 30 tablet 3  . furosemide (LASIX) 20 MG tablet Take 1 tablet (20 mg total) by mouth daily. 20 tablet 0  . gabapentin (NEURONTIN) 300 MG capsule Take 1 capsule (300 mg total) by mouth 2 (two) times daily. 60 capsule 3  . insulin aspart protamine- aspart (NOVOLOG MIX 70/30) (70-30) 100 UNIT/ML injection Inject 0.55 mLs (55 Units total) into the skin 2 (two) times daily with a meal. 60 mL 3  . lisinopril-hydrochlorothiazide (PRINZIDE,ZESTORETIC) 20-25 MG tablet Take 1 tablet by mouth daily. 30 tablet 3  . metFORMIN (GLUCOPHAGE) 500 MG tablet Take 2 tablets (1,000 mg total) by mouth 2 (two) times daily with a meal. 120 tablet 3  . acetaminophen-codeine (TYLENOL #3) 300-30 MG tablet Take 1 tablet by mouth every 6 (six) hours as needed for moderate pain. (Patient not taking: Reported on 03/05/2018) 20 tablet 0  . Blood Glucose Monitoring Suppl (TRUE METRIX METER) w/Device KIT 1 Device by Does not apply route 3 (three) times daily. (Patient not taking: Reported on 06/23/2018)  1 kit 0  . colchicine 0.6 MG tablet Take 0.6 mg by mouth daily.    Marland Kitchen glucose blood (TRUE METRIX BLOOD GLUCOSE TEST) test strip Use as instructed 3 times daily (Patient not taking: Reported on 06/23/2018) 100 each 12  . glucose blood (TRUE METRIX BLOOD GLUCOSE TEST) test strip 1 each by Other route 3 (three) times daily. (Patient not taking: Reported on 06/23/2018) 100 each 12  . INS SYRINGE/NEEDLE 1CC/28G (TRUEPLUS INSULIN SYRINGE) 28G X 1/2" 1 ML MISC 1 each by Does not apply route 2 (two) times daily. (Patient not  taking: Reported on 06/23/2018) 60 each 12  . TRUEPLUS INSULIN SYRINGE 30G X 5/16" 0.5 ML MISC USE FOR INSULIN INJECTION AS DIRECTED (Patient not taking: Reported on 06/23/2018) 100 each 2  . TRUEPLUS LANCETS 28G MISC USE AS DIRECTED 3 TIMES DAILY. (Patient not taking: Reported on 06/23/2018) 100 each 2  . benzonatate (TESSALON) 100 MG capsule Take 2 capsules (200 mg total) by mouth 2 (two) times daily as needed for cough. (Patient not taking: Reported on 06/23/2018) 40 capsule 0  . butalbital-acetaminophen-caffeine (FIORICET, ESGIC) 50-325-40 MG tablet Take 1 tablet by mouth every 6 (six) hours as needed for headache. (Patient not taking: Reported on 06/23/2018) 30 tablet 0  . methocarbamol (ROBAXIN-750) 750 MG tablet Take 1 tablet (750 mg total) by mouth 2 (two) times daily as needed for muscle spasms. (Patient not taking: Reported on 06/23/2018) 60 tablet 1  . metoprolol succinate (TOPROL-XL) 50 MG 24 hr tablet Take 1 tablet (50 mg total) by mouth daily. (Patient not taking: Reported on 06/23/2018) 30 tablet 3  . mupirocin ointment (BACTROBAN) 2 % Apply bid to AA X 10 days (Patient not taking: Reported on 06/23/2018) 22 g 2   No facility-administered medications prior to visit.     ROS Review of Systems  Constitutional: Negative for activity change and appetite change.  HENT: Negative for sinus pressure and sore throat.   Eyes: Negative for visual disturbance.  Respiratory: Negative for cough, chest tightness and shortness of breath.   Cardiovascular: Positive for leg swelling. Negative for chest pain.  Gastrointestinal: Negative for abdominal distention, abdominal pain, constipation and diarrhea.  Endocrine: Negative.   Genitourinary: Negative for dysuria.  Musculoskeletal:       See hpi  Skin: Negative for rash.  Allergic/Immunologic: Negative.   Neurological: Negative for weakness, light-headedness and numbness.  Psychiatric/Behavioral: Negative for dysphoric mood and suicidal ideas.     Objective:  BP 132/88   Pulse 86   Temp 97.9 F (36.6 C) (Oral)   Ht '5\' 5"'$  (1.651 m)   Wt 287 lb 12.8 oz (130.5 kg)   SpO2 98%   BMI 47.89 kg/m   BP/Weight 06/23/2018 03/23/2018 6/96/2952  Systolic BP 841 324 401  Diastolic BP 88 86 87  Wt. (Lbs) 287.8 287.6 283  BMI 47.89 47.86 47.09      Physical Exam  Constitutional: He is oriented to person, place, and time. He appears well-developed and well-nourished.  Cardiovascular: Normal rate, normal heart sounds and intact distal pulses.  No murmur heard. Pulmonary/Chest: Effort normal and breath sounds normal. He has no wheezes. He has no rales. He exhibits no tenderness.  Abdominal: Soft. Bowel sounds are normal. He exhibits no distension and no mass. There is no tenderness.  Musculoskeletal: He exhibits edema (2+ non piting pedal edema b/l).  Slight edema of both knees and tenderness on range of motion  Neurological: He is alert and oriented to person, place,  and time.  Skin: Skin is warm and dry.  Psychiatric: He has a normal mood and affect.    CMP Latest Ref Rng & Units 02/05/2018 08/12/2017 10/18/2016  Glucose 65 - 99 mg/dL 294(H) 282(H) 79  BUN 6 - 24 mg/dL _0 Creatinine 0.76 - 1.27 mg/dL 1.20 1.24 0.95  Sodium 134 - 144 mmol/L 142 137 141  Potassium 3.5 - 5.2 mmol/L 4.8 4.4 3.5  Chloride 96 - 106 mmol/L 100 98 105  CO2 20 - 29 mmol/L _1 Calcium 8.7 - 10.2 mg/dL 9.6 9.6 9.6  Total Protein 6.0 - 8.5 g/dL - 7.6 8.2(H)  Total Bilirubin 0.0 - 1.2 mg/dL - 0.3 0.6  Alkaline Phos 39 - 117 IU/L - 112 77  AST 0 - 40 IU/L - 23 14  ALT 0 - 44 IU/L - 20 11    Lipid Panel     Component Value Date/Time   CHOL 111 08/12/2017 0900   TRIG 152 (H) 08/12/2017 0900   HDL 30 (L) 08/12/2017 0900   CHOLHDL 3.7 08/12/2017 0900   CHOLHDL 4.5 10/10/2016 1125   VLDL 23 10/10/2016 1125   LDLCALC 51 08/12/2017 0900    Lab Results  Component Value Date   HGBA1C 9.5 (A) 06/23/2018    Assessment & Plan:   1. Type 2  diabetes mellitus with other specified complication, with long-term current use of insulin (HCC) A1c is 9.5 which has improved from 12.3 previously Increase NovoLog 70/30 to 60 units twice daily Counseled on Diabetic diet, my plate method, 943 minutes of moderate intensity exercise/week Keep blood sugar logs with fasting goals of 80-120 mg/dl, random of less than 180 and in the event of sugars less than 60 mg/dl or greater than 400 mg/dl please notify the clinic ASAP. It is recommended that you undergo annual eye exams and annual foot exams. Pneumonia vaccine is recommended. - POCT glucose (manual entry) - POCT glycosylated hemoglobin (Hb A1C) - Ambulatory referral to Ophthalmology - atorvastatin (LIPITOR) 40 MG tablet; TAKE 1 TABLET BY MOUTH DAILY AT 6 PM.  Dispense: 30 tablet; Refill: 3 - insulin aspart protamine- aspart (NOVOLOG MIX 70/30) (70-30) 100 UNIT/ML injection; Inject 0.6 mLs (60 Units total) into the skin 2 (two) times daily with a meal.  Dispense: 60 mL; Refill: 3 - metFORMIN (GLUCOPHAGE) 500 MG tablet; Take 2 tablets (1,000 mg total) by mouth 2 (two) times daily with a meal.  Dispense: 120 tablet; Refill: 3 - metoprolol succinate (TOPROL-XL) 50 MG 24 hr tablet; Take 1 tablet (50 mg total) by mouth daily.  Dispense: 30 tablet; Refill: 3 - Tdap (BOOSTRIX) 5-2.5-18.5 LF-MCG/0.5 injection; Inject 0.5 mLs into the muscle once for 1 dose.  Dispense: 0.5 mL; Refill: 0  2. Essential hypertension Controlled Discontinue amlodipine due to pedal edema Refilled metoprolol which he has not been taking - atorvastatin (LIPITOR) 40 MG tablet; TAKE 1 TABLET BY MOUTH DAILY AT 6 PM.  Dispense: 30 tablet; Refill: 3 - lisinopril-hydrochlorothiazide (PRINZIDE,ZESTORETIC) 20-25 MG tablet; Take 1 tablet by mouth daily.  Dispense: 30 tablet; Refill: 3 - metoprolol succinate (TOPROL-XL) 50 MG 24 hr tablet; Take 1 tablet (50 mg total) by mouth daily.  Dispense: 30 tablet; Refill: 3  3. Other diabetic  neurological complication associated with type 2 diabetes mellitus (HCC) Stable - gabapentin (NEURONTIN) 300 MG capsule; Take 1 capsule (300 mg total) by mouth 2 (two) times daily.  Dispense: 60 capsule; Refill: 3  4. Primary osteoarthritis of both knees  Uncontrolled X-rays revealed advanced osteoarthritis Referred to orthopedics - AMB referral to orthopedics  5. Pedal edema Use compression stockings, elevate feet, low-sodium diet Refill Lasix - furosemide (LASIX) 20 MG tablet; Take 1 tablet (20 mg total) by mouth daily.  Dispense: 30 tablet; Refill: 3  6. Screening for colon cancer - Ambulatory referral to Gastroenterology   Meds ordered this encounter  Medications  . atorvastatin (LIPITOR) 40 MG tablet    Sig: TAKE 1 TABLET BY MOUTH DAILY AT 6 PM.    Dispense:  30 tablet    Refill:  3  . insulin aspart protamine- aspart (NOVOLOG MIX 70/30) (70-30) 100 UNIT/ML injection    Sig: Inject 0.6 mLs (60 Units total) into the skin 2 (two) times daily with a meal.    Dispense:  60 mL    Refill:  3    Discontinue previous dose  . metFORMIN (GLUCOPHAGE) 500 MG tablet    Sig: Take 2 tablets (1,000 mg total) by mouth 2 (two) times daily with a meal.    Dispense:  120 tablet    Refill:  3    Discontinue previous dose  . lisinopril-hydrochlorothiazide (PRINZIDE,ZESTORETIC) 20-25 MG tablet    Sig: Take 1 tablet by mouth daily.    Dispense:  30 tablet    Refill:  3  . metoprolol succinate (TOPROL-XL) 50 MG 24 hr tablet    Sig: Take 1 tablet (50 mg total) by mouth daily.    Dispense:  30 tablet    Refill:  3    Discontinue Amlodipine  . furosemide (LASIX) 20 MG tablet    Sig: Take 1 tablet (20 mg total) by mouth daily.    Dispense:  30 tablet    Refill:  3  . gabapentin (NEURONTIN) 300 MG capsule    Sig: Take 1 capsule (300 mg total) by mouth 2 (two) times daily.    Dispense:  60 capsule    Refill:  3  . Tdap (BOOSTRIX) 5-2.5-18.5 LF-MCG/0.5 injection    Sig: Inject 0.5 mLs into  the muscle once for 1 dose.    Dispense:  0.5 mL    Refill:  0    Follow-up: Return in about 3 months (around 09/23/2018) for Follow-up of chronic medical conditions.   Charlott Rakes MD

## 2018-07-02 ENCOUNTER — Encounter (INDEPENDENT_AMBULATORY_CARE_PROVIDER_SITE_OTHER): Payer: Self-pay | Admitting: Orthopaedic Surgery

## 2018-07-02 ENCOUNTER — Ambulatory Visit (INDEPENDENT_AMBULATORY_CARE_PROVIDER_SITE_OTHER): Payer: Medicare HMO | Admitting: Orthopaedic Surgery

## 2018-07-02 ENCOUNTER — Ambulatory Visit (INDEPENDENT_AMBULATORY_CARE_PROVIDER_SITE_OTHER): Payer: Self-pay

## 2018-07-02 ENCOUNTER — Ambulatory Visit (INDEPENDENT_AMBULATORY_CARE_PROVIDER_SITE_OTHER): Payer: Medicare HMO

## 2018-07-02 DIAGNOSIS — M1711 Unilateral primary osteoarthritis, right knee: Secondary | ICD-10-CM | POA: Diagnosis not present

## 2018-07-02 DIAGNOSIS — M1712 Unilateral primary osteoarthritis, left knee: Secondary | ICD-10-CM

## 2018-07-02 MED ORDER — DICLOFENAC SODIUM 1 % TD GEL: 2.0000 g | Freq: Four times a day (QID) | TRANSDERMAL | 5 refills | Status: DC

## 2018-07-02 MED ORDER — MELOXICAM 7.5 MG PO TABS: 7.5000 mg | ORAL_TABLET | Freq: Two times a day (BID) | ORAL | 2 refills | Status: DC | PRN

## 2018-07-02 NOTE — Progress Notes (Signed)
Office Visit Note   Patient: [name]           Date of Birth: [birth year]           MRN: [id number] Visit Date: 07/02/2018              Requested by: Charlott Rakes, MD [place], [place] [address] PCP: Charlott Rakes, MD   Assessment & Plan: Visit Diagnoses:  1. Primary osteoarthritis of right knee   2. Primary osteoarthritis of left knee     Plan: Impression is 57 year old gentleman with bilateral advanced tricompartmental degenerative joint disease.  We reviewed the x-rays and discussed findings.  Unfortunately patient is not a surgical candidate given his increased BMI and poorly controlled diabetes.  Previous hemoglobin A1c is greater than 9.0.  We have referred him to Dr. Leafy Ro for weight loss counseling.  He will make all efforts for weight loss.  Questions encouraged and answered.  Follow-up as needed.  Follow-Up Instructions: Return if symptoms worsen or fail to improve.   Orders:  Orders Placed This Encounter  Procedures  . XR KNEE 3 VIEW LEFT  . XR KNEE 3 VIEW RIGHT   Meds ordered this encounter  Medications  . diclofenac sodium (VOLTAREN) 1 % GEL    Sig: Apply 2 g topically 4 (four) times daily.    Dispense:  1 Tube    Refill:  5  . meloxicam (MOBIC) 7.5 MG tablet    Sig: Take 1 tablet (7.5 mg total) by mouth 2 (two) times daily as needed for pain.    Dispense:  60 tablet    Refill:  2      Procedures: No procedures performed   Clinical Data: No additional findings.   Subjective: Chief Complaint  Patient presents with  . Right Knee - Pain  . Left Knee - Pain    Patient is a 57 year old gentleman who comes in with chronic bilateral knee pain is worse with standing and activity.  He denies any mechanical symptoms.  He walks with a walker.  He is a poorly controlled diabetic.  Denies any injuries.  He is status post bilateral knee arthroscopy 3 years ago.   Review of Systems  Constitutional: Negative.   All other  systems reviewed and are negative.    Objective: Vital Signs: There were no vitals taken for this visit.  Physical Exam  Constitutional: He is oriented to person, place, and time. He appears well-developed and well-nourished.  HENT:  Head: Normocephalic and atraumatic.  Eyes: Pupils are equal, round, and reactive to light.  Neck: Neck supple.  Pulmonary/Chest: Effort normal.  Abdominal: Soft.  Musculoskeletal: Normal range of motion.  Neurological: He is alert and oriented to person, place, and time.  Skin: Skin is warm.  Psychiatric: He has a normal mood and affect. His behavior is normal. Judgment and thought content normal.  Nursing note and vitals reviewed.   Ortho Exam Bilateral knee exam shows no joint effusion.  Stable collaterals and cruciates.  Normal range of motion.  Positive patellofemoral crepitus. Specialty Comments:  No specialty comments available.  Imaging: Xr Knee 3 View Left  Result Date: 07/02/2018 Advanced tricompartmental degenerative joint disease  Xr Knee 3 View Right  Result Date: 07/02/2018 Advanced tricompartmental degenerative joint disease    PMFS History: Patient Active Problem List   Diagnosis Date Noted  . Diabetic neuropathy ([place]) 09/08/2017  . Insomnia 12/26/2016  . Scrotal abscess 10/09/2016  . Acute meniscal tear of  right knee 12/29/2015  . Acute meniscal tear of left knee 12/29/2015  . Other specified crystal arthropathies, left knee 12/29/2015  . Other specified crystal arthropathies, right knee 12/29/2015  . Arthritis of knee   . Inability to ambulate due to knee   . Microcytic anemia   . Bilateral knee pain 12/23/2015  . Hypertension 12/23/2015  . Chronic mid back pain 12/23/2015  . Gout 12/23/2015  . Osteoarthritis of both knees 12/23/2015  . Intractable pain 12/23/2015  . Normocytic anemia 12/23/2015  . Gouty bursitis, elbow  10/18/2015  . IDDM (insulin dependent diabetes mellitus) ([place]) 10/18/2015   Past Medical  History:  Diagnosis Date  . Gouty bursitis, elbow  10/18/2015  . Hx of gout   . Hyperlipidemia   . Hypertension   . Knee pain, chronic   . Type II diabetes mellitus ([facility])     Family History  Problem Relation Age of Onset  . Diabetes Mother   . Diabetes Father     Past Surgical History:  Procedure Laterality Date  . KNEE ARTHROSCOPY Bilateral 12/29/2015   Procedure: ARTHROSCOPY KNEE BILATERAL WITH  PARTIAL MEDIAL AND LATERAL MENISCECTOMY AND FOUR COMPARTMENT SYNOVECTOMY , CHONDRALPLASTIES, PATELLA-FEMORAL CHONDRALPLASTIES BILATERALLY;  Surgeon: Dorna Leitz, MD;  Location: [facility];  Service: Orthopedics;  Laterality: Bilateral;  . NO PAST SURGERIES     Social History   Occupational History  . Not on file  Tobacco Use  . Smoking status: Never Smoker  . Smokeless tobacco: Never Used  Substance and Sexual Activity  . Alcohol use: No  . Drug use: No  . Sexual activity: Not Currently

## 2018-07-13 MED FILL — GABAPENTIN 300 MG CAPSULE: 300 | 30 days supply | Qty: 60 | Fill #1

## 2018-07-13 MED FILL — LISINOPRIL-HCTZ 20-25 MG TA: 20-25 | 30 days supply | Qty: 30 | Fill #3

## 2018-07-13 MED FILL — AMLODIPINE BESYLATE 10 MG T: 10 | 30 days supply | Qty: 30 | Fill #0

## 2018-07-13 MED FILL — NOVOLOG MIX 70/30 VIAL: (70-30) 100 | 27 days supply | Qty: 30 | Fill #4

## 2018-07-13 MED FILL — ATORVASTATIN CALCIUM 40 MG: 40 | 30 days supply | Qty: 30 | Fill #3

## 2018-07-13 MED FILL — METOPROLOL SUCCINATE ER 50: 50 | 30 days supply | Qty: 30 | Fill #1

## 2018-07-17 ENCOUNTER — Ambulatory Visit: Payer: Self-pay | Admitting: Gastroenterology

## 2018-08-10 ENCOUNTER — Other Ambulatory Visit: Payer: Self-pay

## 2018-08-10 DIAGNOSIS — E1169 Type 2 diabetes mellitus with other specified complication: Secondary | ICD-10-CM

## 2018-08-10 DIAGNOSIS — Z794 Long term (current) use of insulin: Principal | ICD-10-CM

## 2018-08-10 MED ORDER — ACCU-CHEK SOFTCLIX LANCETS MISC: 12 refills | Status: DC

## 2018-08-10 MED ORDER — GLUCOSE BLOOD VI STRP: ORAL_STRIP | 12 refills | Status: DC

## 2018-08-10 MED ORDER — ACCU-CHEK GUIDE W/DEVICE KIT: 1.0000 | PACK | Freq: Three times a day (TID) | 0 refills | Status: DC

## 2018-08-10 MED FILL — ATORVASTATIN CALCIUM 40 MG: 40 | 30 days supply | Qty: 30 | Fill #0

## 2018-08-10 MED FILL — ACCU-CHEK GUIDE W/DEVICE KI: W/DEVICE | 30 days supply | Qty: 1 | Fill #0

## 2018-08-10 MED FILL — NOVOLOG MIX 70/30 VIAL: (70-30) 100 | 27 days supply | Qty: 30 | Fill #5

## 2018-08-10 MED FILL — metFORMIN HCL 500 MG TABS: 500 | 30 days supply | Qty: 120 | Fill #0

## 2018-08-10 MED FILL — LISINOPRIL-HCTZ 20-25 MG TA: 20-25 | 30 days supply | Qty: 30 | Fill #0

## 2018-08-10 MED FILL — GABAPENTIN 300 MG CAPSULE: 300 | 30 days supply | Qty: 60 | Fill #2

## 2018-08-10 MED FILL — ACCU-CHEK GUIDE TEST STRIP: 30 days supply | Qty: 100 | Fill #0

## 2018-08-10 MED FILL — METOPROLOL SUCCINATE ER 50: 50 | 30 days supply | Qty: 30 | Fill #2

## 2018-08-10 MED FILL — FUROSEMIDE 20 MG TABLET: 20 | 30 days supply | Qty: 30 | Fill #1

## 2018-08-11 ENCOUNTER — Other Ambulatory Visit: Payer: Self-pay

## 2018-08-11 DIAGNOSIS — Z794 Long term (current) use of insulin: Principal | ICD-10-CM

## 2018-08-11 DIAGNOSIS — E1169 Type 2 diabetes mellitus with other specified complication: Secondary | ICD-10-CM

## 2018-08-11 MED ORDER — ACCU-CHEK FASTCLIX LANCETS MISC: 12 refills | Status: DC

## 2018-08-11 MED FILL — ACCU-CHEK FASTCLIX LANCETS: 30 days supply | Qty: 100 | Fill #0

## 2018-09-07 MED FILL — FUROSEMIDE 20 MG TABLET: 20 | 30 days supply | Qty: 30 | Fill #2

## 2018-09-07 MED FILL — NOVOLOG MIX 70/30 VIAL: (70-30) 100 | 27 days supply | Qty: 30 | Fill #6

## 2018-09-07 MED FILL — METOPROLOL SUCCINATE ER 50: 50 | 30 days supply | Qty: 30 | Fill #3

## 2018-09-07 MED FILL — GABAPENTIN 300 MG CAPSULE: 300 | 30 days supply | Qty: 60 | Fill #3

## 2018-09-07 MED FILL — ATORVASTATIN CALCIUM 40 MG: 40 | 30 days supply | Qty: 30 | Fill #1

## 2018-09-07 MED FILL — LISINOPRIL-HCTZ 20-25 MG TA: 20-25 | 30 days supply | Qty: 30 | Fill #1

## 2018-09-23 ENCOUNTER — Telehealth: Payer: Self-pay | Admitting: Pharmacist

## 2018-09-23 ENCOUNTER — Ambulatory Visit: Payer: Medicare HMO | Attending: Family Medicine | Admitting: Family Medicine

## 2018-09-23 ENCOUNTER — Other Ambulatory Visit: Payer: Self-pay

## 2018-09-23 ENCOUNTER — Encounter: Payer: Self-pay | Admitting: Family Medicine

## 2018-09-23 VITALS — BP 134/87 | HR 84 | Temp 98.2°F | Ht 65.0 in | Wt 275.0 lb

## 2018-09-23 DIAGNOSIS — Z7982 Long term (current) use of aspirin: Secondary | ICD-10-CM | POA: Insufficient documentation

## 2018-09-23 DIAGNOSIS — Z1211 Encounter for screening for malignant neoplasm of colon: Secondary | ICD-10-CM

## 2018-09-23 DIAGNOSIS — L609 Nail disorder, unspecified: Secondary | ICD-10-CM | POA: Insufficient documentation

## 2018-09-23 DIAGNOSIS — Z794 Long term (current) use of insulin: Principal | ICD-10-CM

## 2018-09-23 DIAGNOSIS — I10 Essential (primary) hypertension: Secondary | ICD-10-CM

## 2018-09-23 DIAGNOSIS — E1140 Type 2 diabetes mellitus with diabetic neuropathy, unspecified: Secondary | ICD-10-CM | POA: Diagnosis not present

## 2018-09-23 DIAGNOSIS — E1149 Type 2 diabetes mellitus with other diabetic neurological complication: Secondary | ICD-10-CM | POA: Diagnosis not present

## 2018-09-23 DIAGNOSIS — Z79899 Other long term (current) drug therapy: Secondary | ICD-10-CM | POA: Diagnosis not present

## 2018-09-23 DIAGNOSIS — E1169 Type 2 diabetes mellitus with other specified complication: Secondary | ICD-10-CM

## 2018-09-23 DIAGNOSIS — E66813 Obesity, class 3: Secondary | ICD-10-CM

## 2018-09-23 DIAGNOSIS — L602 Onychogryphosis: Secondary | ICD-10-CM

## 2018-09-23 DIAGNOSIS — E785 Hyperlipidemia, unspecified: Secondary | ICD-10-CM | POA: Insufficient documentation

## 2018-09-23 DIAGNOSIS — M170 Bilateral primary osteoarthritis of knee: Secondary | ICD-10-CM

## 2018-09-23 DIAGNOSIS — Z6841 Body Mass Index (BMI) 40.0 and over, adult: Secondary | ICD-10-CM | POA: Diagnosis not present

## 2018-09-23 DIAGNOSIS — R600 Localized edema: Secondary | ICD-10-CM | POA: Diagnosis not present

## 2018-09-23 DIAGNOSIS — Z7901 Long term (current) use of anticoagulants: Secondary | ICD-10-CM | POA: Insufficient documentation

## 2018-09-23 DIAGNOSIS — M109 Gout, unspecified: Secondary | ICD-10-CM | POA: Insufficient documentation

## 2018-09-23 LAB — POCT GLYCOSYLATED HEMOGLOBIN (HGB A1C): Hemoglobin A1C: 8.6 % — AB (ref 4.0–5.6)

## 2018-09-23 LAB — GLUCOSE, POCT (MANUAL RESULT ENTRY): POC GLUCOSE: 166 mg/dL — AB (ref 70–99)

## 2018-09-23 MED ORDER — METFORMIN HCL 500 MG PO TABS: 1000.0000 mg | ORAL_TABLET | Freq: Two times a day (BID) | ORAL | 3 refills | Status: DC

## 2018-09-23 MED ORDER — LIRAGLUTIDE 18 MG/3ML ~~LOC~~ SOPN: 0.6000 mg | PEN_INJECTOR | Freq: Every day | SUBCUTANEOUS | 3 refills | Status: DC

## 2018-09-23 MED ORDER — INSULIN ASPART PROT & ASPART (70-30 MIX) 100 UNIT/ML ~~LOC~~ SUSP: 50.0000 [IU] | Freq: Two times a day (BID) | SUBCUTANEOUS | 3 refills | Status: DC

## 2018-09-23 MED ORDER — GABAPENTIN 300 MG PO CAPS: 300.0000 mg | ORAL_CAPSULE | Freq: Two times a day (BID) | ORAL | 3 refills | Status: DC

## 2018-09-23 MED ORDER — ATORVASTATIN CALCIUM 40 MG PO TABS: ORAL_TABLET | ORAL | 3 refills | Status: DC

## 2018-09-23 MED ORDER — FUROSEMIDE 20 MG PO TABS: 20.0000 mg | ORAL_TABLET | Freq: Every day | ORAL | 3 refills | Status: DC

## 2018-09-23 MED ORDER — METOPROLOL SUCCINATE ER 50 MG PO TB24: 50.0000 mg | ORAL_TABLET | Freq: Every day | ORAL | 3 refills | Status: DC

## 2018-09-23 MED ORDER — INSULIN PEN NEEDLE 32G X 4 MM MISC: 12 refills | Status: DC

## 2018-09-23 MED ORDER — LISINOPRIL-HYDROCHLOROTHIAZIDE 20-25 MG PO TABS: 1.0000 | ORAL_TABLET | Freq: Every day | ORAL | 3 refills | Status: DC

## 2018-09-23 MED FILL — VICTOZA 18 MG/3 ML INJECT P: 18 | 30 days supply | Qty: 3 | Fill #0

## 2018-09-23 MED FILL — metFORMIN HCL 500 MG TABS: 500 | 90 days supply | Qty: 360 | Fill #0

## 2018-09-23 NOTE — Progress Notes (Signed)
Subjective:  Patient ID: [name], male    DOB: 24-Mar-1961  Age: 57 y.o. MRN: [id number]  CC: Diabetes   HPI [name]  is a 57 year old male with a history of type 2 diabetes mellitus(A1c 9.5), hypertension, hyperlipidemia, gout, osteoarthritis of the knee who presents today for follow-up of Hypertension and diabetes mellitus. His A1c is 8.6 which is down from 9.5 and he informs me he administers 50 units of Novolin 70/30 twice daily rather than 60 units twice daily which was prescribed.  His fasting sugars have been in the 140-150 range.  He denies hypoglycemia and his neuropathy is controlled. He is compliant with his antihypertensive and his statin and denies gout flare. 3 months ago he was seen by orthopedics-Dr.Xu for osteoarthritis of his knees and deemed not to be a surgical candidate due to his weight and poorly controlled diabetes. He complains of long toenails which are painful and he has been unable to clip them.  He does have chronic pedal edema for which she is on Lasix and also tries to use compression stockings with some improvement.  Past Medical History:  Diagnosis Date  . Gouty bursitis, elbow  10/18/2015  . Hx of gout   . Hyperlipidemia   . Hypertension   . Knee pain, chronic   . Type II diabetes mellitus ([place])     Past Surgical History:  Procedure Laterality Date  . KNEE ARTHROSCOPY Bilateral 12/29/2015   Procedure: ARTHROSCOPY KNEE BILATERAL WITH  PARTIAL MEDIAL AND LATERAL MENISCECTOMY AND FOUR COMPARTMENT SYNOVECTOMY , CHONDRALPLASTIES, PATELLA-FEMORAL CHONDRALPLASTIES BILATERALLY;  Surgeon: Dorna Leitz, MD;  Location: [facility];  Service: Orthopedics;  Laterality: Bilateral;  . NO PAST SURGERIES      No Known Allergies   Outpatient Medications Prior to Visit  Medication Sig Dispense Refill  . ACCU-CHEK FASTCLIX LANCETS MISC Use as directed three times daily 100 each 12  . aspirin 81 MG EC tablet Take 1 tablet (81 mg total) by mouth daily. 90  tablet 1  . Blood Glucose Monitoring Suppl (ACCU-CHEK GUIDE) w/Device KIT 1 each by Does not apply route 3 (three) times daily. 1 kit 0  . cetirizine (ZYRTEC) 10 MG tablet TAKE 1 TABLET (10 MG TOTAL) BY MOUTH DAILY. 30 tablet 3  . colchicine 0.6 MG tablet Take 0.6 mg by mouth daily.    . diclofenac sodium (VOLTAREN) 1 % GEL Apply 2 g topically 4 (four) times daily. 1 Tube 5  . FEROSUL 325 (65 Fe) MG tablet TAKE 1 TABLET BY MOUTH DAILY WITH BREAKFAST. 30 tablet 2  . glucose blood (ACCU-CHEK GUIDE) test strip Use as instructed three times daily 100 each 12  . INS SYRINGE/NEEDLE 1CC/28G (TRUEPLUS INSULIN SYRINGE) 28G X 1/2" 1 ML MISC 1 each by Does not apply route 2 (two) times daily. 60 each 12  . meloxicam (MOBIC) 7.5 MG tablet Take 1 tablet (7.5 mg total) by mouth 2 (two) times daily as needed for pain. 60 tablet 2  . traZODone (DESYREL) 50 MG tablet Take 1 tablet (50 mg total) by mouth at bedtime. 30 tablet 3  . TRUEPLUS INSULIN SYRINGE 30G X 5/16" 0.5 ML MISC USE FOR INSULIN INJECTION AS DIRECTED 100 each 2  . atorvastatin (LIPITOR) 40 MG tablet TAKE 1 TABLET BY MOUTH DAILY AT 6 PM. 30 tablet 3  . furosemide (LASIX) 20 MG tablet Take 1 tablet (20 mg total) by mouth daily. 30 tablet 3  . gabapentin (NEURONTIN) 300 MG capsule Take 1 capsule (  300 mg total) by mouth 2 (two) times daily. 60 capsule 3  . insulin aspart protamine- aspart (NOVOLOG MIX 70/30) (70-30) 100 UNIT/ML injection Inject 0.6 mLs (60 Units total) into the skin 2 (two) times daily with a meal. 60 mL 3  . lisinopril-hydrochlorothiazide (PRINZIDE,ZESTORETIC) 20-25 MG tablet Take 1 tablet by mouth daily. 30 tablet 3  . metFORMIN (GLUCOPHAGE) 500 MG tablet Take 2 tablets (1,000 mg total) by mouth 2 (two) times daily with a meal. 120 tablet 3  . metoprolol succinate (TOPROL-XL) 50 MG 24 hr tablet Take 1 tablet (50 mg total) by mouth daily. 30 tablet 3  . acetaminophen-codeine (TYLENOL #3) 300-30 MG tablet Take 1 tablet by mouth every 6  (six) hours as needed for moderate pain. (Patient not taking: Reported on 09/23/2018) 20 tablet 0   No facility-administered medications prior to visit.     ROS Review of Systems  Constitutional: Negative for activity change and appetite change.  HENT: Negative for sinus pressure and sore throat.   Eyes: Negative for visual disturbance.  Respiratory: Negative for cough, chest tightness and shortness of breath.   Cardiovascular: Positive for leg swelling. Negative for chest pain.  Gastrointestinal: Negative for abdominal distention, abdominal pain, constipation and diarrhea.  Endocrine: Negative.   Genitourinary: Negative for dysuria.  Musculoskeletal:       See hpi  Skin: Negative for rash.  Allergic/Immunologic: Negative.   Neurological: Negative for weakness, light-headedness and numbness.  Psychiatric/Behavioral: Negative for dysphoric mood and suicidal ideas.    Objective:  BP 134/87   Pulse 84   Temp 98.2 F (36.8 C) (Oral)   Ht '5\' 5"'  (1.651 m)   Wt 275 lb (124.7 kg)   SpO2 99%   BMI 45.76 kg/m   BP/Weight 09/23/2018 03/22/6567 11/20/7515  Systolic BP 001 749 449  Diastolic BP 87 88 86  Wt. (Lbs) 275 287.8 287.6  BMI 45.76 47.89 47.86      Physical Exam  Constitutional: He is oriented to person, place, and time. He appears well-developed and well-nourished.  Cardiovascular: Normal rate, normal heart sounds and intact distal pulses.  No murmur heard. Pulmonary/Chest: Effort normal and breath sounds normal. He has no wheezes. He has no rales. He exhibits no tenderness.  Abdominal: Soft. Bowel sounds are normal. He exhibits no distension and no mass. There is no tenderness.  Musculoskeletal: Normal range of motion.  Neurological: He is alert and oriented to person, place, and time.  Skin: Skin is warm and dry.  Psychiatric: He has a normal mood and affect.     CMP Latest Ref Rng & Units 02/05/2018 08/12/2017 10/18/2016  Glucose 65 - 99 mg/dL 294(H) 282(H) 79  BUN 6  - 24 mg/dL '18 18 9  ' Creatinine 0.76 - 1.27 mg/dL 1.20 1.24 0.95  Sodium 134 - 144 mmol/L 142 137 141  Potassium 3.5 - 5.2 mmol/L 4.8 4.4 3.5  Chloride 96 - 106 mmol/L 100 98 105  CO2 20 - 29 mmol/L '24 20 26  ' Calcium 8.7 - 10.2 mg/dL 9.6 9.6 9.6  Total Protein 6.0 - 8.5 g/dL - 7.6 8.2(H)  Total Bilirubin 0.0 - 1.2 mg/dL - 0.3 0.6  Alkaline Phos 39 - 117 IU/L - 112 77  AST 0 - 40 IU/L - 23 14  ALT 0 - 44 IU/L - 20 11    Lipid Panel     Component Value Date/Time   CHOL 111 08/12/2017 0900   TRIG 152 (H) 08/12/2017 0900   HDL 30 (  L) 08/12/2017 0900   CHOLHDL 3.7 08/12/2017 0900   CHOLHDL 4.5 10/10/2016 1125   VLDL 23 10/10/2016 1125   LDLCALC 51 08/12/2017 0900    Lab Results  Component Value Date   HGBA1C 8.6 (A) 09/23/2018     Assessment & Plan:   1. Type 2 diabetes mellitus with other specified complication, with long-term current use of insulin (HCC) Uncontrolled with A1c of 8.6 which is down from 9.5 previously Victoza added to regimen He has been taking 50 rather than 60 units of Novolin 70/30 and has been advised to continue at 50 units With exercise caution with titrating his medications as he does have a challenging regards to comprehension Clinical pharmacist called in to reinforce instructions, educated on Victoza excellent diabetic diet, lifestyle modifications - POCT glucose (manual entry) - POCT glycosylated hemoglobin (Hb A1C) - CMP14+EGFR; Future - Lipid panel; Future - liraglutide (VICTOZA) 18 MG/3ML SOPN; Inject 0.1 mLs (0.6 mg total) into the skin daily with breakfast.  Dispense: 30 mL; Refill: 3 - atorvastatin (LIPITOR) 40 MG tablet; TAKE 1 TABLET BY MOUTH DAILY AT 6 PM.  Dispense: 30 tablet; Refill: 3 - insulin aspart protamine- aspart (NOVOLOG MIX 70/30) (70-30) 100 UNIT/ML injection; Inject 0.5 mLs (50 Units total) into the skin 2 (two) times daily with a meal.  Dispense: 60 mL; Refill: 3 - metFORMIN (GLUCOPHAGE) 500 MG tablet; Take 2 tablets (1,000  mg total) by mouth 2 (two) times daily with a meal.  Dispense: 120 tablet; Refill: 3  2. Pedal edema Advised to continue with compression stockings - furosemide (LASIX) 20 MG tablet; Take 1 tablet (20 mg total) by mouth daily.  Dispense: 30 tablet; Refill: 3  3. Other diabetic neurological complication associated with type 2 diabetes mellitus (HCC) Stable - gabapentin (NEURONTIN) 300 MG capsule; Take 1 capsule (300 mg total) by mouth 2 (two) times daily.  Dispense: 60 capsule; Refill: 3  4. Essential hypertension Controlled - lisinopril-hydrochlorothiazide (PRINZIDE,ZESTORETIC) 20-25 MG tablet; Take 1 tablet by mouth daily.  Dispense: 30 tablet; Refill: 3 - metoprolol succinate (TOPROL-XL) 50 MG 24 hr tablet; Take 1 tablet (50 mg total) by mouth daily.  Dispense: 30 tablet; Refill: 3  5. Long toenail - Ambulatory referral to Podiatry  6. Screening for colon cancer - Ambulatory referral to Gastroenterology  7. Primary osteoarthritis of both knees Will need knee surgery however he is not a candidate at this time due to his weight Hopefully Victoza will help with weight loss  8. Class 3 severe obesity due to excess calories with serious comorbidity and body mass index (BMI) of 45.0 to 49.9 in adult [facility]) Decreased portion sizes, increase physical activity He is sedentary and unable to exercise much due to his knee pain   Meds ordered this encounter  Medications  . liraglutide (VICTOZA) 18 MG/3ML SOPN    Sig: Inject 0.1 mLs (0.6 mg total) into the skin daily with breakfast.    Dispense:  30 mL    Refill:  3  . atorvastatin (LIPITOR) 40 MG tablet    Sig: TAKE 1 TABLET BY MOUTH DAILY AT 6 PM.    Dispense:  30 tablet    Refill:  3  . furosemide (LASIX) 20 MG tablet    Sig: Take 1 tablet (20 mg total) by mouth daily.    Dispense:  30 tablet    Refill:  3  . gabapentin (NEURONTIN) 300 MG capsule    Sig: Take 1 capsule (300 mg total) by mouth 2 (  two) times daily.    Dispense:  60  capsule    Refill:  3  . insulin aspart protamine- aspart (NOVOLOG MIX 70/30) (70-30) 100 UNIT/ML injection    Sig: Inject 0.5 mLs (50 Units total) into the skin 2 (two) times daily with a meal.    Dispense:  60 mL    Refill:  3    Discontinue previous dose  . lisinopril-hydrochlorothiazide (PRINZIDE,ZESTORETIC) 20-25 MG tablet    Sig: Take 1 tablet by mouth daily.    Dispense:  30 tablet    Refill:  3  . metFORMIN (GLUCOPHAGE) 500 MG tablet    Sig: Take 2 tablets (1,000 mg total) by mouth 2 (two) times daily with a meal.    Dispense:  120 tablet    Refill:  3  . metoprolol succinate (TOPROL-XL) 50 MG 24 hr tablet    Sig: Take 1 tablet (50 mg total) by mouth daily.    Dispense:  30 tablet    Refill:  3    Follow-up: Return in about 2 weeks (around 10/07/2018) for Follow-up of diabetes with Lurena Joiner, 3 months for chronic medical conditions with PCP.   Charlott Rakes MD

## 2018-09-23 NOTE — Telephone Encounter (Signed)
Patient was educated on the use of the Victoza pen.  Reviewed necessary supplies and operation of the pen. Also reviewed goal blood glucose levels. Patient was able to demonstrate use. All questions and concerns were addressed.  Pt will inject 0.6 mg daily in the morning and follow-up with me in 2 weeks. We are keeping him on this dose and not self-titrating. Pt has some degree of learning disability. Therefore, regimen will be simplified until I can see him again.

## 2018-09-23 NOTE — Patient Instructions (Signed)
Administer Victoza 0.6 mg subcutaneously daily in the morning with breakfast Administer 50 units of Novolin 70/30 subcutaneously twice daily in the morning and evening.

## 2018-10-08 ENCOUNTER — Ambulatory Visit (HOSPITAL_BASED_OUTPATIENT_CLINIC_OR_DEPARTMENT_OTHER): Payer: Medicare HMO | Admitting: Physician Assistant

## 2018-10-08 ENCOUNTER — Encounter: Payer: Self-pay | Admitting: Pharmacist

## 2018-10-08 ENCOUNTER — Ambulatory Visit: Payer: Medicare HMO | Attending: Family Medicine | Admitting: Pharmacist

## 2018-10-08 VITALS — BP 136/87 | HR 82 | Temp 98.9°F | Ht 63.0 in | Wt 283.0 lb

## 2018-10-08 DIAGNOSIS — M25569 Pain in unspecified knee: Secondary | ICD-10-CM | POA: Diagnosis not present

## 2018-10-08 DIAGNOSIS — I10 Essential (primary) hypertension: Secondary | ICD-10-CM | POA: Diagnosis not present

## 2018-10-08 DIAGNOSIS — M1A0490 Idiopathic chronic gout, unspecified hand, without tophus (tophi): Secondary | ICD-10-CM

## 2018-10-08 DIAGNOSIS — M1A0420 Idiopathic chronic gout, left hand, without tophus (tophi): Secondary | ICD-10-CM | POA: Insufficient documentation

## 2018-10-08 DIAGNOSIS — Z794 Long term (current) use of insulin: Secondary | ICD-10-CM

## 2018-10-08 DIAGNOSIS — E785 Hyperlipidemia, unspecified: Secondary | ICD-10-CM | POA: Insufficient documentation

## 2018-10-08 DIAGNOSIS — E1169 Type 2 diabetes mellitus with other specified complication: Secondary | ICD-10-CM

## 2018-10-08 DIAGNOSIS — IMO0001 Reserved for inherently not codable concepts without codable children: Secondary | ICD-10-CM

## 2018-10-08 DIAGNOSIS — Z7982 Long term (current) use of aspirin: Secondary | ICD-10-CM | POA: Insufficient documentation

## 2018-10-08 DIAGNOSIS — E119 Type 2 diabetes mellitus without complications: Secondary | ICD-10-CM

## 2018-10-08 DIAGNOSIS — M1A0410 Idiopathic chronic gout, right hand, without tophus (tophi): Secondary | ICD-10-CM | POA: Diagnosis not present

## 2018-10-08 DIAGNOSIS — M71529 Other bursitis, not elsewhere classified, unspecified elbow: Secondary | ICD-10-CM | POA: Insufficient documentation

## 2018-10-08 DIAGNOSIS — Z79899 Other long term (current) drug therapy: Secondary | ICD-10-CM | POA: Insufficient documentation

## 2018-10-08 LAB — GLUCOSE, POCT (MANUAL RESULT ENTRY): POC GLUCOSE: 110 mg/dL — AB (ref 70–99)

## 2018-10-08 MED ORDER — KETOROLAC TROMETHAMINE 60 MG/2ML IM SOLN
60.0000 mg | Freq: Once | INTRAMUSCULAR | Status: AC
  Administered 2018-10-08: 60 mg via INTRAMUSCULAR

## 2018-10-08 MED ORDER — LIRAGLUTIDE 18 MG/3ML ~~LOC~~ SOPN: 1.2000 mg | PEN_INJECTOR | Freq: Every day | SUBCUTANEOUS | 3 refills | Status: DC

## 2018-10-08 MED ORDER — COLCHICINE 0.6 MG PO TABS: 0.6000 mg | ORAL_TABLET | Freq: Every day | ORAL | 3 refills | Status: DC

## 2018-10-08 MED FILL — GABAPENTIN 300 MG CAPSULE: 300 | 90 days supply | Qty: 180 | Fill #0

## 2018-10-08 MED FILL — TRUEPLUS PEN NDL 32GX5/32: 32G X 4 MM | 90 days supply | Qty: 100 | Fill #0

## 2018-10-08 MED FILL — METOPROLOL SUCCINATE ER 50: 50 | 90 days supply | Qty: 90 | Fill #0

## 2018-10-08 MED FILL — FUROSEMIDE 20 MG TABLET: 20 | 90 days supply | Qty: 90 | Fill #0

## 2018-10-08 MED FILL — LISINOPRIL-HCTZ 20-25 MG TA: 20-25 | 90 days supply | Qty: 90 | Fill #0

## 2018-10-08 MED FILL — TRUEPLUS PEN NDL 32GX5/32": 32G X 4 MM | 90 days supply | Qty: 100 | Fill #0

## 2018-10-08 MED FILL — COLCRYS 0.6 MG TABLET: 0.6 | 30 days supply | Qty: 30 | Fill #0

## 2018-10-08 MED FILL — VICTOZA 2-PAK 18 MG/3 ML PE: 18 | 30 days supply | Qty: 6 | Fill #0

## 2018-10-08 MED FILL — NOVOLOG MIX 70/30 VIAL: (70-30) 100 | 30 days supply | Qty: 30 | Fill #0

## 2018-10-08 NOTE — Progress Notes (Signed)
Patient ID: [name], male   DOB: [birth year], 59 y.o.   MRN: [id number]   [name], is a 57 y.o. male  DJT:[id number]  ZES:[id number]  DOB - 03-30-1961  Subjective:  Chief Complaint and HPI: [name] is a 57 y.o. male here today and c/o B hand pain for about 4 days.  Feels similar to gout in his feet.  No fever or infections.  No small wounds, cuts.  B hands swollen.  Walks with a walker.  Not taking Colchicine "for a while now."  Blood sugars reviewed by Lurena Joiner today.  He was originally scheduled to see Lurena Joiner today  ROS:   Constitutional:  No f/c, No night sweats, No unexplained weight loss. EENT:  No vision changes, No blurry vision, No hearing changes. No mouth, throat, or ear problems.  Respiratory: No cough, No SOB Cardiac: No CP, no palpitations GI:  No abd pain, No N/V/D. GU: No Urinary s/sx Musculoskeletal: B hand pain Neuro: No headache, no dizziness, no motor weakness.  Skin: No rash Endocrine:  No polydipsia. No polyuria.  Psych: Denies SI/HI  No problems updated.  ALLERGIES: No Known Allergies  PAST MEDICAL HISTORY: Past Medical History:  Diagnosis Date  . Gouty bursitis, elbow  10/18/2015  . Hx of gout   . Hyperlipidemia   . Hypertension   . Knee pain, chronic   . Type II diabetes mellitus ([place])     MEDICATIONS AT HOME: Prior to Admission medications   Medication Sig Start Date End Date Taking? Authorizing Provider  ACCU-CHEK FASTCLIX LANCETS MISC Use as directed three times daily 08/11/18   Charlott Rakes, MD  acetaminophen-codeine (TYLENOL #3) 300-30 MG tablet Take 1 tablet by mouth every 6 (six) hours as needed for moderate pain. Patient not taking: Reported on 09/23/2018 02/19/18   Argentina Donovan, PA-C  aspirin 81 MG EC tablet Take 1 tablet (81 mg total) by mouth daily. 12/01/17   Charlott Rakes, MD  atorvastatin (LIPITOR) 40 MG tablet TAKE 1 TABLET BY MOUTH DAILY AT 6 PM. 09/23/18   Charlott Rakes, MD  Blood Glucose Monitoring Suppl  (ACCU-CHEK GUIDE) w/Device KIT 1 each by Does not apply route 3 (three) times daily. 08/10/18   Charlott Rakes, MD  cetirizine (ZYRTEC) 10 MG tablet TAKE 1 TABLET (10 MG TOTAL) BY MOUTH DAILY. 05/05/18   Charlott Rakes, MD  colchicine 0.6 MG tablet Take 1 tablet (0.6 mg total) by mouth daily. 10/08/18   Argentina Donovan, PA-C  diclofenac sodium (VOLTAREN) 1 % GEL Apply 2 g topically 4 (four) times daily. 07/02/18   Leandrew Koyanagi, MD  FEROSUL 325 (65 Fe) MG tablet TAKE 1 TABLET BY MOUTH DAILY WITH BREAKFAST. 10/06/17   Charlott Rakes, MD  furosemide (LASIX) 20 MG tablet Take 1 tablet (20 mg total) by mouth daily. 09/23/18   Charlott Rakes, MD  gabapentin (NEURONTIN) 300 MG capsule Take 1 capsule (300 mg total) by mouth 2 (two) times daily. 09/23/18   Charlott Rakes, MD  glucose blood (ACCU-CHEK GUIDE) test strip Use as instructed three times daily 08/10/18   Charlott Rakes, MD  INS SYRINGE/NEEDLE 1CC/28G (TRUEPLUS INSULIN SYRINGE) 28G X 1/2" 1 ML MISC 1 each by Does not apply route 2 (two) times daily. 03/23/18   Charlott Rakes, MD  insulin aspart protamine- aspart (NOVOLOG MIX 70/30) (70-30) 100 UNIT/ML injection Inject 0.5 mLs (50 Units total) into the skin 2 (two) times daily with a meal. 09/23/18   Charlott Rakes, MD  Insulin Pen  Needle (TRUEPLUS PEN NEEDLES) 32G X 4 MM MISC Use as directed daily to administer victoza 09/23/18   Charlott Rakes, MD  liraglutide (VICTOZA) 18 MG/3ML SOPN Inject 0.1 mLs (0.6 mg total) into the skin daily with breakfast. 09/23/18   Charlott Rakes, MD  lisinopril-hydrochlorothiazide (PRINZIDE,ZESTORETIC) 20-25 MG tablet Take 1 tablet by mouth daily. 09/23/18   Charlott Rakes, MD  meloxicam (MOBIC) 7.5 MG tablet Take 1 tablet (7.5 mg total) by mouth 2 (two) times daily as needed for pain. 07/02/18   Leandrew Koyanagi, MD  metFORMIN (GLUCOPHAGE) 500 MG tablet Take 2 tablets (1,000 mg total) by mouth 2 (two) times daily with a meal. 09/23/18   Charlott Rakes, MD  metoprolol  succinate (TOPROL-XL) 50 MG 24 hr tablet Take 1 tablet (50 mg total) by mouth daily. 09/23/18   Charlott Rakes, MD  traZODone (DESYREL) 50 MG tablet Take 1 tablet (50 mg total) by mouth at bedtime. 12/01/17   Charlott Rakes, MD  TRUEPLUS INSULIN SYRINGE 30G X 5/16" 0.5 ML MISC USE FOR INSULIN INJECTION AS DIRECTED 11/25/17   Charlott Rakes, MD     Objective:  EXAM:   Vitals:   10/08/18 0936  BP: 136/87  Pulse: 82  Temp: 98.9 F (37.2 C)  TempSrc: Oral  Weight: 283 lb (128.4 kg)  Height: '5\' 3"'  (1.6 m)    General appearance : A&OX3. NAD. Non-toxic-appearing HEENT: Atraumatic and Normocephalic.  PERRLA. EOM intact.   Neck: supple, no JVD. No cervical lymphadenopathy. No thyromegaly Chest/Lungs:  Breathing-non-labored, Good air entry bilaterally, breath sounds normal without rales, rhonchi, or wheezing  CVS: S1 S2 regular, no murmurs, gallops, rubs  B hands swollen over 3rd and 4th MCP B.  Pain with passive ROM.  Unable to do full ROM due to pain and swelling.  No erythema or sign of infection Neurology:  CN II-XII grossly intact, Non focal.   Psych:  TP linear. J/I WNL. Normal speech. Appropriate eye contact and affect.  Skin:  No Rash  Data Review Lab Results  Component Value Date   HGBA1C 8.6 (A) 09/23/2018   HGBA1C 9.5 (A) 06/23/2018   HGBA1C 12.3 03/23/2018     Assessment & Plan   1. 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  Dispense: 60 tablet; Refill: 3  2. IDDM (insulin dependent diabetes mellitus) ([place]) Uncontrolled-Per Luke-increase Victoza to 1.2 daily  3. Essential hypertension Controlled-continue current regimen.       Patient have been counseled extensively about nutrition and exercise  Return for for 12/24/2017 appt with Dr Margarita Rana.  The patient was given clear instructions to go to ER or return to medical  center if symptoms don't improve, worsen or new problems develop. The patient verbalized understanding. The patient was told to call to get lab results if they haven't heard anything in the next week.     Freeman Caldron, PA-C [facility] [place], [place]   10/08/2018, 9:59 AM

## 2018-10-08 NOTE — Progress Notes (Signed)
Patient was seen today by Levada Dy for gout flare. POCT glucose 110. Home sugars improved. Denies hypoglycemia or side effects from Victoza. Instructed pt to increase to 1.2 mg daily and follow-up the week after Thanksgiving.

## 2018-11-03 MED FILL — NOVOLOG MIX 70/30 VIAL: (70-30) 100 | 30 days supply | Qty: 30 | Fill #1

## 2018-11-03 MED FILL — ACCU-CHEK GUIDE TEST STRIP: 30 days supply | Qty: 100 | Fill #1

## 2018-11-03 MED FILL — ATORVASTATIN CALCIUM 40 MG: 40 | 30 days supply | Qty: 30 | Fill #2

## 2018-11-03 MED FILL — VICTOZA 2-PAK 18 MG/3 ML PE: 18 | 30 days supply | Qty: 6 | Fill #1

## 2018-11-03 MED FILL — TRUEPLUS SYR 1ML 30GX5/16": 30G X 5/16" | 50 days supply | Qty: 100 | Fill #1

## 2018-11-03 MED FILL — ACCU-CHEK FASTCLIX LANCETS: 30 days supply | Qty: 100 | Fill #1

## 2018-11-03 MED FILL — TRUEPLUS SYR 1ML 30GX5/16: 30G X 5/16" | 50 days supply | Qty: 100 | Fill #1

## 2018-11-04 ENCOUNTER — Other Ambulatory Visit: Payer: Self-pay

## 2018-11-04 DIAGNOSIS — I10 Essential (primary) hypertension: Secondary | ICD-10-CM

## 2018-11-04 DIAGNOSIS — Z794 Long term (current) use of insulin: Secondary | ICD-10-CM

## 2018-11-04 DIAGNOSIS — E1149 Type 2 diabetes mellitus with other diabetic neurological complication: Secondary | ICD-10-CM

## 2018-11-04 DIAGNOSIS — R600 Localized edema: Secondary | ICD-10-CM

## 2018-11-04 DIAGNOSIS — E1169 Type 2 diabetes mellitus with other specified complication: Secondary | ICD-10-CM

## 2018-11-06 MED ORDER — METFORMIN HCL 500 MG PO TABS: 1000.0000 mg | ORAL_TABLET | Freq: Two times a day (BID) | ORAL | 1 refills | Status: DC

## 2018-11-06 MED ORDER — LISINOPRIL-HYDROCHLOROTHIAZIDE 20-25 MG PO TABS: 1.0000 | ORAL_TABLET | Freq: Every day | ORAL | 1 refills | Status: DC

## 2018-11-06 MED ORDER — FUROSEMIDE 20 MG PO TABS: 20.0000 mg | ORAL_TABLET | Freq: Every day | ORAL | 1 refills | Status: DC

## 2018-11-06 MED ORDER — GABAPENTIN 300 MG PO CAPS: 300.0000 mg | ORAL_CAPSULE | Freq: Two times a day (BID) | ORAL | 1 refills | Status: DC

## 2018-11-06 MED ORDER — ATORVASTATIN CALCIUM 40 MG PO TABS: ORAL_TABLET | ORAL | 1 refills | Status: DC

## 2018-11-09 ENCOUNTER — Telehealth: Payer: Self-pay | Admitting: Family Medicine

## 2018-11-09 DIAGNOSIS — Z794 Long term (current) use of insulin: Principal | ICD-10-CM

## 2018-11-09 DIAGNOSIS — E1169 Type 2 diabetes mellitus with other specified complication: Secondary | ICD-10-CM

## 2018-11-09 NOTE — Telephone Encounter (Signed)
1) Medication(s) Requested (by name): victoza novolog 2) Pharmacy of Choice:  [facility]

## 2018-11-13 MED ORDER — INSULIN ASPART PROT & ASPART (70-30 MIX) 100 UNIT/ML ~~LOC~~ SUSP: 50.0000 [IU] | Freq: Two times a day (BID) | SUBCUTANEOUS | 0 refills | Status: DC

## 2018-11-13 MED ORDER — LIRAGLUTIDE 18 MG/3ML ~~LOC~~ SOPN: 1.2000 mg | PEN_INJECTOR | Freq: Every day | SUBCUTANEOUS | 0 refills | Status: DC

## 2018-11-16 ENCOUNTER — Other Ambulatory Visit: Payer: Self-pay

## 2018-11-16 DIAGNOSIS — I10 Essential (primary) hypertension: Secondary | ICD-10-CM

## 2018-11-16 MED ORDER — METOPROLOL SUCCINATE ER 50 MG PO TB24: 50.0000 mg | ORAL_TABLET | Freq: Every day | ORAL | 0 refills | Status: DC

## 2018-11-17 MED FILL — COLCRYS 0.6 MG TABLET: 0.6 | 30 days supply | Qty: 30 | Fill #1

## 2018-12-16 MED FILL — COLCRYS 0.6 MG TABLET: 0.6 | 30 days supply | Qty: 30 | Fill #2

## 2018-12-16 MED FILL — TRUEPLUS SYR 1ML 30GX5/16: 30G X 5/16" | 50 days supply | Qty: 100 | Fill #2

## 2018-12-16 MED FILL — TRUEPLUS SYR 1ML 30GX5/16": 30G X 5/16" | 50 days supply | Qty: 100 | Fill #2

## 2018-12-24 ENCOUNTER — Encounter: Payer: Self-pay | Admitting: Primary Care

## 2018-12-24 ENCOUNTER — Ambulatory Visit: Payer: Self-pay | Admitting: Family Medicine

## 2018-12-24 ENCOUNTER — Ambulatory Visit: Payer: Medicare HMO | Attending: Primary Care | Admitting: Primary Care

## 2018-12-24 VITALS — BP 124/85 | HR 79 | Temp 98.1°F | Resp 16 | Wt 263.0 lb

## 2018-12-24 DIAGNOSIS — E1169 Type 2 diabetes mellitus with other specified complication: Secondary | ICD-10-CM | POA: Diagnosis not present

## 2018-12-24 DIAGNOSIS — Z6841 Body Mass Index (BMI) 40.0 and over, adult: Secondary | ICD-10-CM | POA: Diagnosis not present

## 2018-12-24 DIAGNOSIS — Z129 Encounter for screening for malignant neoplasm, site unspecified: Secondary | ICD-10-CM

## 2018-12-24 DIAGNOSIS — Z794 Long term (current) use of insulin: Secondary | ICD-10-CM

## 2018-12-24 DIAGNOSIS — I10 Essential (primary) hypertension: Secondary | ICD-10-CM | POA: Diagnosis not present

## 2018-12-24 DIAGNOSIS — E11649 Type 2 diabetes mellitus with hypoglycemia without coma: Secondary | ICD-10-CM | POA: Diagnosis not present

## 2018-12-24 DIAGNOSIS — Z23 Encounter for immunization: Secondary | ICD-10-CM

## 2018-12-24 LAB — GLUCOSE, POCT (MANUAL RESULT ENTRY)
POC Glucose: 63 mg/dl — AB (ref 70–99)
POC Glucose: 98 mg/dl (ref 70–99)

## 2018-12-24 LAB — POCT GLYCOSYLATED HEMOGLOBIN (HGB A1C): HbA1c, POC (controlled diabetic range): 6.8 % (ref 0.0–7.0)

## 2018-12-24 MED ORDER — TETANUS-DIPHTH-ACELL PERTUSSIS 5-2.5-18.5 LF-MCG/0.5 IM SUSP
0.5000 mL | Freq: Once | INTRAMUSCULAR | Status: AC
  Administered 2018-12-24: 0.5 mL via INTRAMUSCULAR

## 2018-12-24 NOTE — Progress Notes (Signed)
Established Patient Office Visit  Subjective:  Patient ID: [name], male    DOB: November 21, 1960  Age: 58 y.o. MRN: [id number]  CC: Diabetes follow up Chief Complaint  Patient presents with  . Diabetes    HPI HIGINIO GROW presents for fasting for labs and follow up on chronic diseases. POCT A1C 6.8 we discussed diet and medication compliance. He has hyperlipidemia and on a statin. 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  Past Medical History:  Diagnosis Date  . Gouty bursitis, elbow  10/18/2015  . Hx of gout   . Hyperlipidemia   . Hypertension   . Knee pain, chronic   . Type II diabetes mellitus ([place])     Past Surgical History:  Procedure Laterality Date  . KNEE ARTHROSCOPY Bilateral 12/29/2015   Procedure: ARTHROSCOPY KNEE BILATERAL WITH  PARTIAL MEDIAL AND LATERAL MENISCECTOMY AND FOUR COMPARTMENT SYNOVECTOMY , CHONDRALPLASTIES, PATELLA-FEMORAL CHONDRALPLASTIES BILATERALLY;  Surgeon: Dorna Leitz, MD;  Location: [place];  Service: Orthopedics;  Laterality: Bilateral;  . NO PAST SURGERIES      Family History  Problem Relation Age of Onset  . Diabetes Mother   . Diabetes Father     Social History   Socioeconomic History  . Marital status: Single    Spouse name: Not on file  . Number of children: Not on file  . Years of education: Not on file  . Highest education level: Not on file  Occupational History  . Not on file  Social Needs  . Financial resource strain: Not on file  . Food insecurity:    Worry: Not on file    Inability: Not on file  . Transportation needs:    Medical: Not on file    Non-medical: Not on file  Tobacco Use  . Smoking status: Never Smoker  . Smokeless tobacco: Never Used  Substance and Sexual Activity  . Alcohol use: No  . Drug use: No  . Sexual activity: Not Currently  Lifestyle  . Physical activity:    Days per week: Not on file    Minutes per session: Not on file  . Stress: Not on file    Relationships  . Social connections:    Talks on phone: Not on file    Gets together: Not on file    Attends religious service: Not on file    Active member of club or organization: Not on file    Attends meetings of clubs or organizations: Not on file    Relationship status: Not on file  . Intimate partner violence:    Fear of current or ex partner: Not on file    Emotionally abused: Not on file    Physically abused: Not on file    Forced sexual activity: Not on file  Other Topics Concern  . Not on file  Social History Narrative  . Not on file    Outpatient Medications Prior to Visit  Medication Sig Dispense Refill  . aspirin 81 MG EC tablet Take 1 tablet (81 mg total) by mouth daily. 90 tablet 1  . atorvastatin (LIPITOR) 40 MG tablet TAKE 1 TABLET BY MOUTH DAILY AT 6 PM. 90 tablet 1  . colchicine 0.6 MG tablet Take 1 tablet (0.6 mg total) by mouth daily. 60 tablet 3  . FEROSUL 325 (65 Fe) MG tablet TAKE 1 TABLET BY MOUTH DAILY WITH BREAKFAST. 30 tablet 2  . furosemide (LASIX) 20 MG tablet Take 1 tablet (20 mg  total) by mouth daily. 90 tablet 1  . gabapentin (NEURONTIN) 300 MG capsule Take 1 capsule (300 mg total) by mouth 2 (two) times daily. 180 capsule 1  . liraglutide (VICTOZA) 18 MG/3ML SOPN Inject 0.2 mLs (1.2 mg total) into the skin daily with breakfast. 27 mL 0  . lisinopril-hydrochlorothiazide (PRINZIDE,ZESTORETIC) 20-25 MG tablet Take 1 tablet by mouth daily. 90 tablet 1  . metFORMIN (GLUCOPHAGE) 500 MG tablet Take 2 tablets (1,000 mg total) by mouth 2 (two) times daily with a meal. 360 tablet 1  . metoprolol succinate (TOPROL-XL) 50 MG 24 hr tablet Take 1 tablet (50 mg total) by mouth daily. 90 tablet 0  . ACCU-CHEK FASTCLIX LANCETS MISC Use as directed three times daily 100 each 12  . acetaminophen-codeine (TYLENOL #3) 300-30 MG tablet Take 1 tablet by mouth every 6 (six) hours as needed for moderate pain. (Patient not taking: Reported on 09/23/2018) 20 tablet 0  .  Blood Glucose Monitoring Suppl (ACCU-CHEK GUIDE) w/Device KIT 1 each by Does not apply route 3 (three) times daily. 1 kit 0  . cetirizine (ZYRTEC) 10 MG tablet TAKE 1 TABLET (10 MG TOTAL) BY MOUTH DAILY. 30 tablet 3  . diclofenac sodium (VOLTAREN) 1 % GEL Apply 2 g topically 4 (four) times daily. 1 Tube 5  . glucose blood (ACCU-CHEK GUIDE) test strip Use as instructed three times daily 100 each 12  . INS SYRINGE/NEEDLE 1CC/28G (TRUEPLUS INSULIN SYRINGE) 28G X 1/2" 1 ML MISC 1 each by Does not apply route 2 (two) times daily. 60 each 12  . insulin aspart protamine- aspart (NOVOLOG MIX 70/30) (70-30) 100 UNIT/ML injection Inject 0.5 mLs (50 Units total) into the skin 2 (two) times daily with a meal. 90 mL 0  . Insulin Pen Needle (TRUEPLUS PEN NEEDLES) 32G X 4 MM MISC Use as directed daily to administer victoza 100 each 12  . meloxicam (MOBIC) 7.5 MG tablet Take 1 tablet (7.5 mg total) by mouth 2 (two) times daily as needed for pain. 60 tablet 2  . traZODone (DESYREL) 50 MG tablet Take 1 tablet (50 mg total) by mouth at bedtime. 30 tablet 3  . TRUEPLUS INSULIN SYRINGE 30G X 5/16" 0.5 ML MISC USE FOR INSULIN INJECTION AS DIRECTED 100 each 2   No facility-administered medications prior to visit.     No Known Allergies  ROS Review of Systems  Constitutional: Negative.   HENT: Negative.   Eyes: Negative.   Respiratory: Negative.   Cardiovascular: Negative.   Gastrointestinal: Negative.   Endocrine: Negative.   Genitourinary: Negative.   Musculoskeletal: Positive for arthralgias, back pain and gait problem.  Skin: Negative.       Objective:    Physical Exam  Constitutional: He is oriented to person, place, and time. He appears well-developed.  HENT:  Head: Normocephalic.  Eyes: Pupils are equal, round, and reactive to light. EOM are normal.  Neck: Neck supple.  Cardiovascular: Normal rate and regular rhythm.  Pulmonary/Chest: Effort normal and breath sounds normal.  Abdominal: Soft.  Bowel sounds are normal. He exhibits distension.  Musculoskeletal:        General: Deformity present.  Neurological: He is oriented to person, place, and time.  Skin: Skin is warm and dry.  Psychiatric: He has a normal mood and affect.    BP 124/85 (BP Location: Right Arm, Patient Position: Sitting, Cuff Size: Large)   Pulse 79   Temp 98.1 F (36.7 C) (Oral)   Resp 16   Wt 263  lb (119.3 kg)   SpO2 99%   BMI 46.59 kg/m  Wt Readings from Last 3 Encounters:  12/24/18 263 lb (119.3 kg)  10/08/18 283 lb (128.4 kg)  09/23/18 275 lb (124.7 kg)     Health Maintenance Due  Topic Date Due  . OPHTHALMOLOGY EXAM  10/03/1969  . TETANUS/TDAP  10/03/1978  . COLONOSCOPY  10/03/2009    There are no preventive care reminders to display for this patient.  Lab Results  Component Value Date   TSH 0.994 12/28/2015   Lab Results  Component Value Date   WBC 6.8 10/18/2016   HGB 11.5 (L) 10/18/2016   HCT 36.4 (L) 10/18/2016   MCV 71.9 (L) 10/18/2016   PLT 241 10/18/2016   Lab Results  Component Value Date   NA 142 02/05/2018   K 4.8 02/05/2018   CO2 24 02/05/2018   GLUCOSE 294 (H) 02/05/2018   BUN 18 02/05/2018   CREATININE 1.20 02/05/2018   BILITOT 0.3 08/12/2017   ALKPHOS 112 08/12/2017   AST 23 08/12/2017   ALT 20 08/12/2017   PROT 7.6 08/12/2017   ALBUMIN 4.6 08/12/2017   CALCIUM 9.6 02/05/2018   ANIONGAP 9 10/12/2016   Lab Results  Component Value Date   CHOL 111 08/12/2017   Lab Results  Component Value Date   HDL 30 (L) 08/12/2017   Lab Results  Component Value Date   LDLCALC 51 08/12/2017   Lab Results  Component Value Date   TRIG 152 (H) 08/12/2017   Lab Results  Component Value Date   CHOLHDL 3.7 08/12/2017   Lab Results  Component Value Date   HGBA1C 6.8 12/24/2018      Assessment & Plan:   Problem List Items Addressed This Visit    Hypertension (Chronic)   Class 3 severe obesity due to excess calories with serious comorbidity and body  mass index   (BMI) of 45.0 to 49.9 in adult [facility]) morbid obesity Discuss diet and exercise as tolerated   Relevant Orders   Glucose (CBG) (Completed) 6.8    Other Visit Diagnoses    Type 2 diabetes mellitus with other specified complication, with long-term current use of insulin ([place])    -  Primary   Relevant Orders   HgB A1c (Completed)   Screening for cancer    recc at age 26    Relevant Orders   Ambulatory referral to Gastroenterology   Vaccine for diphtheria-tetanus       Need for diphtheria-tetanus-pertussis (Tdap) vaccine       Relevant Medications   Tdap (BOOSTRIX) injection 0.5 mL (Completed)      Meds ordered this encounter  Medications  . Tdap (BOOSTRIX) injection 0.5 mL    Follow-up: Return in about 3 months (around 03/24/2019) for routine visit .    Kerin Perna, NP

## 2018-12-24 NOTE — Progress Notes (Signed)
DM follow up

## 2018-12-24 NOTE — Patient Instructions (Signed)
Diabetes Mellitus and Nutrition, Adult  When you have diabetes (diabetes mellitus), it is very important to have healthy eating habits because your blood sugar (glucose) levels are greatly affected by what you eat and drink. Eating healthy foods in the appropriate amounts, at about the same times every day, can help you:  · Control your blood glucose.  · Lower your risk of heart disease.  · Improve your blood pressure.  · Reach or maintain a healthy weight.  Every person with diabetes is different, and each person has different needs for a meal plan. Your health care provider may recommend that you work with a diet and nutrition specialist (dietitian) to make a meal plan that is best for you. Your meal plan may vary depending on factors such as:  · The calories you need.  · The medicines you take.  · Your weight.  · Your blood glucose, blood pressure, and cholesterol levels.  · Your activity level.  · Other health conditions you have, such as heart or kidney disease.  How do carbohydrates affect me?  Carbohydrates, also called carbs, affect your blood glucose level more than any other type of food. Eating carbs naturally raises the amount of glucose in your blood. Carb counting is a method for keeping track of how many carbs you eat. Counting carbs is important to keep your blood glucose at a healthy level, especially if you use insulin or take certain oral diabetes medicines.  It is important to know how many carbs you can safely have in each meal. This is different for every person. Your dietitian can help you calculate how many carbs you should have at each meal and for each snack.  Foods that contain carbs include:  · Bread, cereal, rice, pasta, and crackers.  · Potatoes and corn.  · Peas, beans, and lentils.  · Milk and yogurt.  · Fruit and juice.  · Desserts, such as cakes, cookies, ice cream, and candy.  How does alcohol affect me?  Alcohol can cause a sudden decrease in blood glucose (hypoglycemia),  especially if you use insulin or take certain oral diabetes medicines. Hypoglycemia can be a life-threatening condition. Symptoms of hypoglycemia (sleepiness, dizziness, and confusion) are similar to symptoms of having too much alcohol.  If your health care provider says that alcohol is safe for you, follow these guidelines:  · Limit alcohol intake to no more than 1 drink per day for nonpregnant women and 2 drinks per day for men. One drink equals 12 oz of beer, 5 oz of wine, or 1½ oz of hard liquor.  · Do not drink on an empty stomach.  · Keep yourself hydrated with water, diet soda, or unsweetened iced tea.  · Keep in mind that regular soda, juice, and other mixers may contain a lot of sugar and must be counted as carbs.  What are tips for following this plan?    Reading food labels  · Start by checking the serving size on the "Nutrition Facts" label of packaged foods and drinks. The amount of calories, carbs, fats, and other nutrients listed on the label is based on one serving of the item. Many items contain more than one serving per package.  · Check the total grams (g) of carbs in one serving. You can calculate the number of servings of carbs in one serving by dividing the total carbs by 15. For example, if a food has 30 g of total carbs, it would be equal to 2   servings of carbs.  · Check the number of grams (g) of saturated and trans fats in one serving. Choose foods that have low or no amount of these fats.  · Check the number of milligrams (mg) of salt (sodium) in one serving. Most people should limit total sodium intake to less than 2,300 mg per day.  · Always check the nutrition information of foods labeled as "low-fat" or "nonfat". These foods may be higher in added sugar or refined carbs and should be avoided.  · Talk to your dietitian to identify your daily goals for nutrients listed on the label.  Shopping  · Avoid buying canned, premade, or processed foods. These foods tend to be high in fat, sodium,  and added sugar.  · Shop around the outside edge of the grocery store. This includes fresh fruits and vegetables, bulk grains, fresh meats, and fresh dairy.  Cooking  · Use low-heat cooking methods, such as baking, instead of high-heat cooking methods like deep frying.  · Cook using healthy oils, such as olive, canola, or sunflower oil.  · Avoid cooking with butter, cream, or high-fat meats.  Meal planning  · Eat meals and snacks regularly, preferably at the same times every day. Avoid going long periods of time without eating.  · Eat foods high in fiber, such as fresh fruits, vegetables, beans, and whole grains. Talk to your dietitian about how many servings of carbs you can eat at each meal.  · Eat 4-6 ounces (oz) of lean protein each day, such as lean meat, chicken, fish, eggs, or tofu. One oz of lean protein is equal to:  ? 1 oz of meat, chicken, or fish.  ? 1 egg.  ? ¼ cup of tofu.  · Eat some foods each day that contain healthy fats, such as avocado, nuts, seeds, and fish.  Lifestyle  · Check your blood glucose regularly.  · Exercise regularly as told by your health care provider. This may include:  ? 150 minutes of moderate-intensity or vigorous-intensity exercise each week. This could be brisk walking, biking, or water aerobics.  ? Stretching and doing strength exercises, such as yoga or weightlifting, at least 2 times a week.  · Take medicines as told by your health care provider.  · Do not use any products that contain nicotine or tobacco, such as cigarettes and e-cigarettes. If you need help quitting, ask your health care provider.  · Work with a counselor or diabetes educator to identify strategies to manage stress and any emotional and social challenges.  Questions to ask a health care provider  · Do I need to meet with a diabetes educator?  · Do I need to meet with a dietitian?  · What number can I call if I have questions?  · When are the best times to check my blood glucose?  Where to find more  information:  · American Diabetes Association: diabetes.org  · Academy of Nutrition and Dietetics: www.eatright.org  · National Institute of Diabetes and Digestive and Kidney Diseases (NIH): www.niddk.nih.gov  Summary  · A healthy meal plan will help you control your blood glucose and maintain a healthy lifestyle.  · Working with a diet and nutrition specialist (dietitian) can help you make a meal plan that is best for you.  · Keep in mind that carbohydrates (carbs) and alcohol have immediate effects on your blood glucose levels. It is important to count carbs and to use alcohol carefully.  This information is not intended to   replace advice given to you by your health care provider. Make sure you discuss any questions you have with your health care provider.  Document Released: 08/01/2005 Document Revised: 06/04/2017 Document Reviewed: 12/09/2016  [employer] Interactive Patient Education © 2019 [employer]

## 2019-01-19 MED FILL — ACCU-CHEK GUIDE TEST STRIP: 30 days supply | Qty: 100 | Fill #2

## 2019-01-19 MED FILL — ACCU-CHEK FASTCLIX LANCETS: 30 days supply | Qty: 102 | Fill #2

## 2019-01-19 MED FILL — COLCRYS 0.6 MG TABLET: 0.6 | 30 days supply | Qty: 30 | Fill #3

## 2019-01-30 DIAGNOSIS — E119 Type 2 diabetes mellitus without complications: Secondary | ICD-10-CM | POA: Diagnosis not present

## 2019-01-30 DIAGNOSIS — H40033 Anatomical narrow angle, bilateral: Secondary | ICD-10-CM | POA: Diagnosis not present

## 2019-02-03 ENCOUNTER — Other Ambulatory Visit: Payer: Self-pay | Admitting: Pharmacist

## 2019-02-03 DIAGNOSIS — Z794 Long term (current) use of insulin: Secondary | ICD-10-CM

## 2019-02-03 DIAGNOSIS — E1169 Type 2 diabetes mellitus with other specified complication: Secondary | ICD-10-CM

## 2019-02-03 DIAGNOSIS — M1A0490 Idiopathic chronic gout, unspecified hand, without tophus (tophi): Secondary | ICD-10-CM

## 2019-02-03 MED ORDER — INSULIN ASPART PROT & ASPART (70-30 MIX) 100 UNIT/ML ~~LOC~~ SUSP: 50.0000 [IU] | Freq: Two times a day (BID) | SUBCUTANEOUS | 0 refills | Status: DC

## 2019-02-03 MED ORDER — LIRAGLUTIDE 18 MG/3ML ~~LOC~~ SOPN: 1.2000 mg | PEN_INJECTOR | Freq: Every day | SUBCUTANEOUS | 1 refills | Status: DC

## 2019-02-03 MED ORDER — COLCHICINE 0.6 MG PO TABS: 0.6000 mg | ORAL_TABLET | Freq: Every day | ORAL | 0 refills | Status: DC

## 2019-02-04 MED FILL — TRUEPLUS PEN NDL 32GX5/32: 32G X 4 MM | 90 days supply | Qty: 100 | Fill #1

## 2019-02-04 MED FILL — TRUEPLUS SYR 1ML 30GX5/16: 30G X 5/16" | 50 days supply | Qty: 100 | Fill #3

## 2019-02-04 MED FILL — TRUEPLUS SYR 1ML 30GX5/16": 30G X 5/16" | 50 days supply | Qty: 100 | Fill #3

## 2019-02-04 MED FILL — TRUEPLUS PEN NDL 32GX5/32": 32G X 4 MM | 90 days supply | Qty: 100 | Fill #1

## 2019-03-22 MED FILL — TRUEPLUS SYR 1ML 30GX5/16": 30G X 5/16" | 50 days supply | Qty: 100 | Fill #4

## 2019-03-22 MED FILL — LISINOPRIL-HCTZ 20-25 MG TA: 20-25 | 30 days supply | Qty: 30 | Fill #1

## 2019-03-22 MED FILL — ATORVASTATIN CALCIUM 40 MG: 40 | 30 days supply | Qty: 30 | Fill #3

## 2019-03-22 MED FILL — GABAPENTIN 300 MG CAPSULE: 300 | 30 days supply | Qty: 60 | Fill #1

## 2019-03-22 MED FILL — FUROSEMIDE 20 MG TABS: 20 | 30 days supply | Qty: 30 | Fill #1

## 2019-03-22 MED FILL — TRUEPLUS SYR 1ML 30GX5/16: 30G X 5/16" | 50 days supply | Qty: 100 | Fill #4

## 2019-03-30 ENCOUNTER — Ambulatory Visit: Payer: Medicare HMO | Attending: Family Medicine | Admitting: Family Medicine

## 2019-03-30 ENCOUNTER — Other Ambulatory Visit: Payer: Self-pay

## 2019-03-30 ENCOUNTER — Encounter: Payer: Self-pay | Admitting: Family Medicine

## 2019-03-30 VITALS — BP 160/101 | HR 69 | Temp 97.4°F | Ht 63.0 in | Wt 267.0 lb

## 2019-03-30 DIAGNOSIS — I10 Essential (primary) hypertension: Secondary | ICD-10-CM

## 2019-03-30 DIAGNOSIS — Z1211 Encounter for screening for malignant neoplasm of colon: Secondary | ICD-10-CM

## 2019-03-30 DIAGNOSIS — E119 Type 2 diabetes mellitus without complications: Secondary | ICD-10-CM | POA: Diagnosis not present

## 2019-03-30 DIAGNOSIS — E1169 Type 2 diabetes mellitus with other specified complication: Secondary | ICD-10-CM

## 2019-03-30 DIAGNOSIS — E1149 Type 2 diabetes mellitus with other diabetic neurological complication: Secondary | ICD-10-CM

## 2019-03-30 DIAGNOSIS — R600 Localized edema: Secondary | ICD-10-CM

## 2019-03-30 DIAGNOSIS — Z794 Long term (current) use of insulin: Secondary | ICD-10-CM | POA: Diagnosis not present

## 2019-03-30 LAB — POCT GLYCOSYLATED HEMOGLOBIN (HGB A1C): HbA1c, POC (controlled diabetic range): 6.5 % (ref 0.0–7.0)

## 2019-03-30 LAB — GLUCOSE, POCT (MANUAL RESULT ENTRY): POC Glucose: 101 mg/dl — AB (ref 70–99)

## 2019-03-30 MED ORDER — LISINOPRIL-HYDROCHLOROTHIAZIDE 20-12.5 MG PO TABS: 2.0000 | ORAL_TABLET | Freq: Every day | ORAL | 1 refills | Status: DC

## 2019-03-30 MED ORDER — ATORVASTATIN CALCIUM 40 MG PO TABS: ORAL_TABLET | ORAL | 1 refills | Status: DC

## 2019-03-30 MED ORDER — FUROSEMIDE 20 MG PO TABS: 20.0000 mg | ORAL_TABLET | Freq: Every day | ORAL | 1 refills | Status: DC

## 2019-03-30 MED ORDER — METOPROLOL SUCCINATE ER 50 MG PO TB24: 50.0000 mg | ORAL_TABLET | Freq: Every day | ORAL | 1 refills | Status: DC

## 2019-03-30 MED ORDER — INSULIN ASPART PROT & ASPART (70-30 MIX) 100 UNIT/ML ~~LOC~~ SUSP: 50.0000 [IU] | Freq: Two times a day (BID) | SUBCUTANEOUS | 1 refills | Status: DC

## 2019-03-30 MED ORDER — METFORMIN HCL 500 MG PO TABS: 1000.0000 mg | ORAL_TABLET | Freq: Two times a day (BID) | ORAL | 1 refills | Status: DC

## 2019-03-30 MED ORDER — LIRAGLUTIDE 18 MG/3ML ~~LOC~~ SOPN: 1.2000 mg | PEN_INJECTOR | Freq: Every day | SUBCUTANEOUS | 1 refills | Status: DC

## 2019-03-30 MED ORDER — GABAPENTIN 300 MG PO CAPS: 300.0000 mg | ORAL_CAPSULE | Freq: Two times a day (BID) | ORAL | 1 refills | Status: DC

## 2019-03-30 MED FILL — LISINOPRIL-HCTZ 20-12.5 MG: 20-12.5 | 90 days supply | Qty: 180 | Fill #0

## 2019-03-30 MED FILL — ACCU-CHEK FASTCLIX LANCETS: 30 days supply | Qty: 102 | Fill #3

## 2019-03-30 MED FILL — ACCU-CHEK GUIDE TEST STRIP: 30 days supply | Qty: 100 | Fill #3

## 2019-03-30 NOTE — Progress Notes (Signed)
Subjective:  Patient ID: [name], male    DOB: [birth year]  Age: 58 y.o. MRN: [id number]  CC: Diabetes   HPI [name]  is a 58 year old male with a history of type 2 diabetes mellitus(A1c 6.5), hypertension, hyperlipidemia, gout, osteoarthritis of the knee (status post bilateral knee arthroscopy in the past) who presents today for follow-up of Hypertension and diabetes mellitus.  He denies hypoglycemia and his fasting sugars have ranged between 107 and 125 and he denies hypoglycemia.  I have reviewed his blood sugar logs myself.  He denies blurry vision, hypoglycemia and his neuropathy is controlled on gabapentin. His blood pressure is elevated today and he endorses compliance with his medications. He does have chronic pedal edema and is on Lasix for this.  Denies dyspnea, chest pain.  Seen by orthopedics- Dr Erlinda Hong for bilateral knee osteoarthritis, not thought to be surgical candidate due to poorly controlled diabetes at that time, increased BMI.  He denies knee pains today. Denies recent gout flares. He has no acute concerns at this time.  Past Medical History:  Diagnosis Date   Gouty bursitis, elbow  10/18/2015   Hx of gout    Hyperlipidemia    Hypertension    Knee pain, chronic    Type II diabetes mellitus ([place])     Past Surgical History:  Procedure Laterality Date   KNEE ARTHROSCOPY Bilateral 12/29/2015   Procedure: ARTHROSCOPY KNEE BILATERAL WITH  PARTIAL MEDIAL AND LATERAL MENISCECTOMY AND FOUR COMPARTMENT SYNOVECTOMY , [place], PATELLA-FEMORAL CHONDRALPLASTIES BILATERALLY;  Surgeon: Dorna Leitz, MD;  Location: [facility];  Service: Orthopedics;  Laterality: Bilateral;   NO PAST SURGERIES      Family History  Problem Relation Age of Onset   Diabetes Mother    Diabetes Father     No Known Allergies  Outpatient Medications Prior to Visit  Medication Sig Dispense Refill   ACCU-CHEK FASTCLIX LANCETS MISC Use as directed three  times daily 100 each 12   aspirin 81 MG EC tablet Take 1 tablet (81 mg total) by mouth daily. 90 tablet 1   atorvastatin (LIPITOR) 40 MG tablet TAKE 1 TABLET BY MOUTH DAILY AT 6 PM. 90 tablet 1   Blood Glucose Monitoring Suppl (ACCU-CHEK GUIDE) w/Device KIT 1 each by Does not apply route 3 (three) times daily. 1 kit 0   cetirizine (ZYRTEC) 10 MG tablet TAKE 1 TABLET (10 MG TOTAL) BY MOUTH DAILY. 30 tablet 3   colchicine 0.6 MG tablet Take 1 tablet (0.6 mg total) by mouth daily. 90 tablet 0   diclofenac sodium (VOLTAREN) 1 % GEL Apply 2 g topically 4 (four) times daily. 1 Tube 5   FEROSUL 325 (65 Fe) MG tablet TAKE 1 TABLET BY MOUTH DAILY WITH BREAKFAST. 30 tablet 2   furosemide (LASIX) 20 MG tablet Take 1 tablet (20 mg total) by mouth daily. 90 tablet 1   gabapentin (NEURONTIN) 300 MG capsule Take 1 capsule (300 mg total) by mouth 2 (two) times daily. 180 capsule 1   glucose blood (ACCU-CHEK GUIDE) test strip Use as instructed three times daily 100 each 12   INS SYRINGE/NEEDLE 1CC/28G (TRUEPLUS INSULIN SYRINGE) 28G X 1/2" 1 ML MISC 1 each by Does not apply route 2 (two) times daily. 60 each 12   insulin aspart protamine- aspart (NOVOLOG MIX 70/30) (70-30) 100 UNIT/ML injection Inject 0.5 mLs (50 Units total) into the skin 2 (two) times daily with a meal. 90 mL 0   Insulin Pen  Needle (TRUEPLUS PEN NEEDLES) 32G X 4 MM MISC Use as directed daily to administer victoza 100 each 12   liraglutide (VICTOZA) 18 MG/3ML SOPN Inject 0.2 mLs (1.2 mg total) into the skin daily with breakfast. 9 mL 1   meloxicam (MOBIC) 7.5 MG tablet Take 1 tablet (7.5 mg total) by mouth 2 (two) times daily as needed for pain. 60 tablet 2   metFORMIN (GLUCOPHAGE) 500 MG tablet Take 2 tablets (1,000 mg total) by mouth 2 (two) times daily with a meal. 360 tablet 1   metoprolol succinate (TOPROL-XL) 50 MG 24 hr tablet Take 1 tablet (50 mg total) by mouth daily. 90 tablet 0   TRUEPLUS INSULIN SYRINGE 30G X 5/16" 0.5  ML MISC USE FOR INSULIN INJECTION AS DIRECTED 100 each 2   lisinopril-hydrochlorothiazide (PRINZIDE,ZESTORETIC) 20-25 MG tablet Take 1 tablet by mouth daily. 90 tablet 1   acetaminophen-codeine (TYLENOL #3) 300-30 MG tablet Take 1 tablet by mouth every 6 (six) hours as needed for moderate pain. (Patient not taking: Reported on 09/23/2018) 20 tablet 0   traZODone (DESYREL) 50 MG tablet Take 1 tablet (50 mg total) by mouth at bedtime. (Patient not taking: Reported on 03/30/2019) 30 tablet 3   No facility-administered medications prior to visit.      ROS Review of Systems  Constitutional: Negative for activity change and appetite change.  HENT: Negative for sinus pressure and sore throat.   Eyes: Negative for visual disturbance.  Respiratory: Negative for cough, chest tightness and shortness of breath.   Cardiovascular: Negative for chest pain and leg swelling.  Gastrointestinal: Negative for abdominal distention, abdominal pain, constipation and diarrhea.  Endocrine: Negative.   Genitourinary: Negative for dysuria.  Musculoskeletal: Negative for joint swelling and myalgias.  Skin: Negative for rash.  Allergic/Immunologic: Negative.   Neurological: Negative for weakness, light-headedness and numbness.  Psychiatric/Behavioral: Negative for dysphoric mood and suicidal ideas.    Objective:  BP (!) 160/101    Pulse 69    Temp (!) 97.4 F (36.3 C) (Oral)    Ht '5\' 3"'  (1.6 m)    Wt 267 lb (121.1 kg)    BMI 47.30 kg/m   BP/Weight 03/30/2019 12/24/2018 81/82/9937  Systolic BP 169 678 938  Diastolic BP 101 85 87  Wt. (Lbs) 267 263 283  BMI 47.3 46.59 50.13      Physical Exam Constitutional:      Appearance: He is well-developed.  Cardiovascular:     Rate and Rhythm: Normal rate.     Heart sounds: Normal heart sounds. No murmur.  Pulmonary:     Effort: Pulmonary effort is normal.     Breath sounds: Normal breath sounds. No wheezing or rales.  Chest:     Chest wall: No tenderness.    Abdominal:     General: Bowel sounds are normal. There is no distension.     Palpations: Abdomen is soft. There is no mass.     Tenderness: There is no abdominal tenderness.  Musculoskeletal: Normal range of motion.     Right lower leg: Edema (1+) present.     Left lower leg: Edema (1+) present.  Neurological:     Mental Status: He is alert and oriented to person, place, and time.  Psychiatric:        Mood and Affect: Mood normal.     CMP Latest Ref Rng & Units 02/05/2018 08/12/2017 10/18/2016  Glucose 65 - 99 mg/dL 294(H) 282(H) 79  BUN 6 - 24 mg/dL 18 18 9  Creatinine 0.76 - 1.27 mg/dL 1.20 1.24 0.95  Sodium 134 - 144 mmol/L 142 137 141  Potassium 3.5 - 5.2 mmol/L 4.8 4.4 3.5  Chloride 96 - 106 mmol/L 100 98 105  CO2 20 - 29 mmol/L '24 20 26  ' Calcium 8.7 - 10.2 mg/dL 9.6 9.6 9.6  Total Protein 6.0 - 8.5 g/dL - 7.6 8.2(H)  Total Bilirubin 0.0 - 1.2 mg/dL - 0.3 0.6  Alkaline Phos 39 - 117 IU/L - 112 77  AST 0 - 40 IU/L - 23 14  ALT 0 - 44 IU/L - 20 11    Lipid Panel     Component Value Date/Time   CHOL 111 08/12/2017 0900   TRIG 152 (H) 08/12/2017 0900   HDL 30 (L) 08/12/2017 0900   CHOLHDL 3.7 08/12/2017 0900   CHOLHDL 4.5 10/10/2016 1125   VLDL 23 10/10/2016 1125   LDLCALC 51 08/12/2017 0900    CBC    Component Value Date/Time   WBC 6.8 10/18/2016 1640   RBC 5.06 10/18/2016 1640   HGB 11.5 (L) 10/18/2016 1640   HCT 36.4 (L) 10/18/2016 1640   PLT 241 10/18/2016 1640   MCV 71.9 (L) 10/18/2016 1640   MCH 22.7 (L) 10/18/2016 1640   MCHC 31.6 (L) 10/18/2016 1640   RDW 18.0 (H) 10/18/2016 1640   LYMPHSABS 1,972 10/18/2016 1640   MONOABS 408 10/18/2016 1640   EOSABS 68 10/18/2016 1640   BASOSABS 0 10/18/2016 1640    Lab Results  Component Value Date   HGBA1C 6.5 03/30/2019    Assessment & Plan:   1. Type 2 diabetes mellitus with other specified complication, with long-term current use of insulin (HCC) Controlled Continue current management, diabetic diet  and lifestyle modifications - POCT glucose (manual entry) - POCT glycosylated hemoglobin (Hb A1C) - CMP14+EGFR - Microalbumin/Creatinine Ratio, Urine - atorvastatin (LIPITOR) 40 MG tablet; TAKE 1 TABLET BY MOUTH DAILY AT 6 PM.  Dispense: 90 tablet; Refill: 1 - insulin aspart protamine- aspart (NOVOLOG MIX 70/30) (70-30) 100 UNIT/ML injection; Inject 0.5 mLs (50 Units total) into the skin 2 (two) times daily with a meal.  Dispense: 90 mL; Refill: 1 - liraglutide (VICTOZA) 18 MG/3ML SOPN; Inject 0.2 mLs (1.2 mg total) into the skin daily with breakfast.  Dispense: 9 mL; Refill: 1 - metFORMIN (GLUCOPHAGE) 500 MG tablet; Take 2 tablets (1,000 mg total) by mouth 2 (two) times daily with a meal.  Dispense: 360 tablet; Refill: 1  2. Pedal edema Chronic pedal edema Elevate feet, low-sodium diet Compression stockings will be beneficial - furosemide (LASIX) 20 MG tablet; Take 1 tablet (20 mg total) by mouth daily.  Dispense: 90 tablet; Refill: 1  3. Other diabetic neurological complication associated with type 2 diabetes mellitus (HCC) Stable - gabapentin (NEURONTIN) 300 MG capsule; Take 1 capsule (300 mg total) by mouth 2 (two) times daily.  Dispense: 180 capsule; Refill: 1  4. Essential hypertension Uncontrolled Switched from lisinopril/HCTZ 20/25 mg to lisinopril/HCTZ 20/12.5 mg (2 tabs daily) Low-sodium, DASH diet - lisinopril-hydrochlorothiazide (ZESTORETIC) 20-12.5 MG tablet; Take 2 tablets by mouth daily.  Dispense: 180 tablet; Refill: 1 - metoprolol succinate (TOPROL-XL) 50 MG 24 hr tablet; Take 1 tablet (50 mg total) by mouth daily.  Dispense: 90 tablet; Refill: 1  5. Screening for colon cancer - Ambulatory referral to Gastroenterology   Return in about 3 months (around 06/30/2019) for chroinic medical conditions.       Charlott Rakes, MD, FAAFP. [place]  [place], [place]   03/30/2019, 9:20 AM

## 2019-03-31 LAB — CMP14+EGFR
ALT: 31 IU/L (ref 0–44)
AST: 28 IU/L (ref 0–40)
Albumin/Globulin Ratio: 1.5 (ref 1.2–2.2)
Albumin: 4.7 g/dL (ref 3.8–4.9)
Alkaline Phosphatase: 90 IU/L (ref 39–117)
BUN/Creatinine Ratio: 22 — ABNORMAL HIGH (ref 9–20)
BUN: 31 mg/dL — ABNORMAL HIGH (ref 6–24)
Bilirubin Total: 0.4 mg/dL (ref 0.0–1.2)
CO2: 23 mmol/L (ref 20–29)
Calcium: 9.7 mg/dL (ref 8.7–10.2)
Chloride: 99 mmol/L (ref 96–106)
Creatinine, Ser: 1.42 mg/dL — ABNORMAL HIGH (ref 0.76–1.27)
GFR calc Af Amer: 62 mL/min/{1.73_m2} (ref >59)
GFR calc non Af Amer: 54 mL/min/{1.73_m2} — ABNORMAL LOW (ref >59)
Globulin, Total: 3.1 g/dL (ref 1.5–4.5)
Glucose: 90 mg/dL (ref 65–99)
Potassium: 4.4 mmol/L (ref 3.5–5.2)
Sodium: 140 mmol/L (ref 134–144)
Total Protein: 7.8 g/dL (ref 6.0–8.5)

## 2019-03-31 LAB — MICROALBUMIN / CREATININE URINE RATIO
Creatinine, Urine: 48.4 mg/dL
Microalb/Creat Ratio: 11 mg/g creat (ref 0–29)
Microalbumin, Urine: 5.2 ug/mL

## 2019-04-08 ENCOUNTER — Telehealth: Payer: Self-pay

## 2019-04-08 NOTE — Telephone Encounter (Signed)
Patient was called and there is no VM set up to leave a message.

## 2019-04-08 NOTE — Telephone Encounter (Signed)
-----   Message from Charlott Rakes, MD sent at 04/01/2019  3:09 PM EDT ----- Labs are stable

## 2019-05-12 ENCOUNTER — Other Ambulatory Visit: Payer: Self-pay | Admitting: Family Medicine

## 2019-05-12 MED FILL — TRUEPLUS PEN NDL 32GX5/32: 32G X 4 MM | 90 days supply | Qty: 100 | Fill #2

## 2019-05-12 MED FILL — TRUEPLUS PEN NDL 32GX5/32": 32G X 4 MM | 90 days supply | Qty: 100 | Fill #2

## 2019-05-13 MED FILL — TRUEPLUS SYR 1ML 30GX5/16: 30G X 5/16" | 50 days supply | Qty: 100 | Fill #0

## 2019-05-17 ENCOUNTER — Other Ambulatory Visit: Payer: Self-pay | Admitting: Pharmacist

## 2019-05-17 DIAGNOSIS — Z794 Long term (current) use of insulin: Secondary | ICD-10-CM

## 2019-05-17 DIAGNOSIS — E1169 Type 2 diabetes mellitus with other specified complication: Secondary | ICD-10-CM

## 2019-05-17 DIAGNOSIS — I10 Essential (primary) hypertension: Secondary | ICD-10-CM

## 2019-05-17 MED ORDER — LISINOPRIL-HYDROCHLOROTHIAZIDE 20-12.5 MG PO TABS: 2.0000 | ORAL_TABLET | Freq: Every day | ORAL | 1 refills | Status: DC

## 2019-05-17 MED ORDER — "INSULIN SYRINGE-NEEDLE U-100 30G X 5/16"" 1 ML MISC": 12 refills | Status: DC

## 2019-05-17 MED ORDER — VICTOZA 18 MG/3ML ~~LOC~~ SOPN: 1.2000 mg | PEN_INJECTOR | Freq: Every day | SUBCUTANEOUS | 1 refills | Status: DC

## 2019-05-17 MED ORDER — TRUEPLUS PEN NEEDLES 32G X 4 MM MISC: 12 refills | Status: DC

## 2019-05-19 ENCOUNTER — Other Ambulatory Visit: Payer: Self-pay | Admitting: Pharmacist

## 2019-05-19 DIAGNOSIS — M1A0490 Idiopathic chronic gout, unspecified hand, without tophus (tophi): Secondary | ICD-10-CM

## 2019-05-19 DIAGNOSIS — R600 Localized edema: Secondary | ICD-10-CM

## 2019-05-19 DIAGNOSIS — E1169 Type 2 diabetes mellitus with other specified complication: Secondary | ICD-10-CM

## 2019-05-19 DIAGNOSIS — I10 Essential (primary) hypertension: Secondary | ICD-10-CM

## 2019-05-19 DIAGNOSIS — Z794 Long term (current) use of insulin: Secondary | ICD-10-CM

## 2019-05-19 MED ORDER — COLCHICINE 0.6 MG PO TABS: 0.6000 mg | ORAL_TABLET | Freq: Every day | ORAL | 0 refills | Status: DC

## 2019-05-19 MED ORDER — INSULIN ASPART PROT & ASPART (70-30 MIX) 100 UNIT/ML ~~LOC~~ SUSP: 50.0000 [IU] | Freq: Two times a day (BID) | SUBCUTANEOUS | 0 refills | Status: DC

## 2019-05-19 MED ORDER — ATORVASTATIN CALCIUM 40 MG PO TABS: ORAL_TABLET | ORAL | 0 refills | Status: DC

## 2019-05-19 MED ORDER — METOPROLOL SUCCINATE ER 50 MG PO TB24: 50.0000 mg | ORAL_TABLET | Freq: Every day | ORAL | 0 refills | Status: DC

## 2019-05-19 MED ORDER — FUROSEMIDE 20 MG PO TABS: 20.0000 mg | ORAL_TABLET | Freq: Every day | ORAL | 0 refills | Status: DC

## 2019-05-29 ENCOUNTER — Emergency Department (HOSPITAL_COMMUNITY)
Admission: EM | Admit: 2019-05-29 | Discharge: 2019-05-29 | Disposition: A | Discharge status: Home / Self Care | Payer: Medicare HMO | Attending: Emergency Medicine | Admitting: Emergency Medicine

## 2019-05-29 ENCOUNTER — Encounter (HOSPITAL_COMMUNITY): Payer: Self-pay | Admitting: *Deleted

## 2019-05-29 ENCOUNTER — Other Ambulatory Visit: Payer: Self-pay

## 2019-05-29 DIAGNOSIS — I10 Essential (primary) hypertension: Secondary | ICD-10-CM | POA: Insufficient documentation

## 2019-05-29 DIAGNOSIS — Z7982 Long term (current) use of aspirin: Secondary | ICD-10-CM | POA: Insufficient documentation

## 2019-05-29 DIAGNOSIS — M10062 Idiopathic gout, left knee: Secondary | ICD-10-CM | POA: Diagnosis not present

## 2019-05-29 DIAGNOSIS — M2550 Pain in unspecified joint: Secondary | ICD-10-CM | POA: Diagnosis present

## 2019-05-29 DIAGNOSIS — M10041 Idiopathic gout, right hand: Secondary | ICD-10-CM | POA: Diagnosis not present

## 2019-05-29 DIAGNOSIS — Z79899 Other long term (current) drug therapy: Secondary | ICD-10-CM | POA: Diagnosis not present

## 2019-05-29 DIAGNOSIS — M10061 Idiopathic gout, right knee: Secondary | ICD-10-CM | POA: Diagnosis not present

## 2019-05-29 DIAGNOSIS — R58 Hemorrhage, not elsewhere classified: Secondary | ICD-10-CM | POA: Diagnosis not present

## 2019-05-29 DIAGNOSIS — E1140 Type 2 diabetes mellitus with diabetic neuropathy, unspecified: Secondary | ICD-10-CM | POA: Insufficient documentation

## 2019-05-29 DIAGNOSIS — M5489 Other dorsalgia: Secondary | ICD-10-CM | POA: Diagnosis not present

## 2019-05-29 DIAGNOSIS — M109 Gout, unspecified: Secondary | ICD-10-CM | POA: Insufficient documentation

## 2019-05-29 DIAGNOSIS — M10042 Idiopathic gout, left hand: Secondary | ICD-10-CM | POA: Diagnosis not present

## 2019-05-29 DIAGNOSIS — R52 Pain, unspecified: Secondary | ICD-10-CM | POA: Diagnosis not present

## 2019-05-29 DIAGNOSIS — Z794 Long term (current) use of insulin: Secondary | ICD-10-CM | POA: Insufficient documentation

## 2019-05-29 DIAGNOSIS — R609 Edema, unspecified: Secondary | ICD-10-CM | POA: Diagnosis not present

## 2019-05-29 LAB — URINALYSIS, ROUTINE W REFLEX MICROSCOPIC
Bacteria, UA: NONE SEEN
Bilirubin Urine: NEGATIVE
Glucose, UA: NEGATIVE mg/dL
Ketones, ur: NEGATIVE mg/dL
Leukocytes,Ua: NEGATIVE
Nitrite: NEGATIVE
Protein, ur: 30 mg/dL — AB
Specific Gravity, Urine: 1.017 (ref 1.005–1.030)
pH: 5 (ref 5.0–8.0)

## 2019-05-29 LAB — CBC WITH DIFFERENTIAL/PLATELET
Abs Immature Granulocytes: 0.05 10*3/uL (ref 0.00–0.07)
Basophils Absolute: 0 10*3/uL (ref 0.0–0.1)
Basophils Relative: 0 %
Eosinophils Absolute: 0 10*3/uL (ref 0.0–0.5)
Eosinophils Relative: 0 %
HCT: 36.3 % — ABNORMAL LOW (ref 39.0–52.0)
Hemoglobin: 11.4 g/dL — ABNORMAL LOW (ref 13.0–17.0)
Immature Granulocytes: 1 %
Lymphocytes Relative: 12 %
Lymphs Abs: 1.1 10*3/uL (ref 0.7–4.0)
MCH: 23.7 pg — ABNORMAL LOW (ref 26.0–34.0)
MCHC: 31.4 g/dL (ref 30.0–36.0)
MCV: 75.3 fL — ABNORMAL LOW (ref 80.0–100.0)
Monocytes Absolute: 1.1 10*3/uL — ABNORMAL HIGH (ref 0.1–1.0)
Monocytes Relative: 11 %
Neutro Abs: 7.1 10*3/uL (ref 1.7–7.7)
Neutrophils Relative %: 76 %
Platelets: 195 10*3/uL (ref 150–400)
RBC: 4.82 MIL/uL (ref 4.22–5.81)
RDW: 15.6 % — ABNORMAL HIGH (ref 11.5–15.5)
WBC: 9.3 10*3/uL (ref 4.0–10.5)
nRBC: 0 % (ref 0.0–0.2)

## 2019-05-29 LAB — CBG MONITORING, ED: Glucose-Capillary: 259 mg/dL — ABNORMAL HIGH (ref 70–99)

## 2019-05-29 LAB — BASIC METABOLIC PANEL
Anion gap: 16 — ABNORMAL HIGH (ref 5–15)
BUN: 43 mg/dL — ABNORMAL HIGH (ref 6–20)
CO2: 24 mmol/L (ref 22–32)
Calcium: 9.1 mg/dL (ref 8.9–10.3)
Chloride: 93 mmol/L — ABNORMAL LOW (ref 98–111)
Creatinine, Ser: 1.57 mg/dL — ABNORMAL HIGH (ref 0.61–1.24)
GFR calc Af Amer: 55 mL/min — ABNORMAL LOW (ref >60)
GFR calc non Af Amer: 48 mL/min — ABNORMAL LOW (ref >60)
Glucose, Bld: 300 mg/dL — ABNORMAL HIGH (ref 70–99)
Potassium: 3.5 mmol/L (ref 3.5–5.1)
Sodium: 133 mmol/L — ABNORMAL LOW (ref 135–145)

## 2019-05-29 MED ORDER — SODIUM CHLORIDE 0.9 % IV BOLUS
1000.0000 mL | Freq: Once | INTRAVENOUS | Status: AC
  Administered 2019-05-29: 1000 mL via INTRAVENOUS

## 2019-05-29 MED ORDER — INDOMETHACIN 50 MG PO CAPS: 50.0000 mg | ORAL_CAPSULE | Freq: Three times a day (TID) | ORAL | 0 refills | Status: AC

## 2019-05-29 MED ORDER — COLCHICINE 0.6 MG PO TABS
0.6000 mg | ORAL_TABLET | Freq: Once | ORAL | Status: AC
  Administered 2019-05-29: 0.6 mg via ORAL
  Filled 2019-05-29: qty 1

## 2019-05-29 MED ORDER — HYDROCODONE-ACETAMINOPHEN 5-325 MG PO TABS: 1.0000 | ORAL_TABLET | Freq: Four times a day (QID) | ORAL | 0 refills | Status: DC | PRN

## 2019-05-29 MED ORDER — MORPHINE SULFATE (PF) 4 MG/ML IV SOLN
4.0000 mg | Freq: Once | INTRAVENOUS | Status: AC
  Administered 2019-05-29: 4 mg via INTRAVENOUS
  Filled 2019-05-29: qty 1

## 2019-05-29 MED ORDER — KETOROLAC TROMETHAMINE 30 MG/ML IJ SOLN
30.0000 mg | Freq: Once | INTRAMUSCULAR | Status: AC
  Administered 2019-05-29: 30 mg via INTRAVENOUS
  Filled 2019-05-29: qty 1

## 2019-05-29 MED ORDER — SODIUM CHLORIDE 0.9 % IV BOLUS
500.0000 mL | Freq: Once | INTRAVENOUS | Status: AC
  Administered 2019-05-29: 500 mL via INTRAVENOUS

## 2019-05-29 NOTE — ED Triage Notes (Signed)
The pt arrived by ptar  He is c/o pain all over his body he does have visible swelling in both his hands and his feet he has not had a bm for several days  He has had this swelling since July 4th.  The pt lives at home with his wife

## 2019-05-29 NOTE — Discharge Instructions (Addendum)
You have been seen today for joint pain in multiple sites. Please read and follow all provided instructions.   1. Medications: continue colchicine as prescribed, take indomethacin with meals, and take norco (pain medicine) for moderate to severe pain (pain medication may cause drowsiness, do not operate heavy machinery or drive while taking this medication), usual home medications 2. Treatment: rest, drink plenty of fluids 3. Follow Up: Please follow up with your primary doctor in 1-2 days for discussion of your diagnoses and further evaluation after today's visit; if you do not have a primary care doctor use the resource guide provided to find one; Please return to the ER for any new or worsening symptoms. Please obtain all of your results from medical records or have your doctors office obtain the results - share them with your doctor - you should be seen at your doctors office. Call today to arrange your follow up.   Take medications as prescribed. Please review all of the medicines and only take them if you do not have an allergy to them. Return to the emergency room for worsening condition or new concerning symptoms. Follow up with your regular doctor. If you don't have a regular doctor use one of the numbers below to establish a primary care doctor.  Please be aware that if you are taking birth control pills, taking other prescriptions, ESPECIALLY ANTIBIOTICS may make the birth control ineffective - if this is the case, either do not engage in sexual activity or use alternative methods of birth control such as condoms until you have finished the medicine and your family doctor says it is OK to restart them. If you are on a blood thinner such as COUMADIN, be aware that any other medicine that you take may cause the coumadin to either work too much, or not enough - you should have your coumadin level rechecked in next 7 days if this is the case.  ?  It is also a possibility that you have an allergic  reaction to any of the medicines that you have been prescribed - Everybody reacts differently to medications and while MOST people have no trouble with most medicines, you may have a reaction such as nausea, vomiting, rash, swelling, shortness of breath. If this is the case, please stop taking the medicine immediately and contact your physician.  ?  You should return to the ER if you develop severe or worsening symptoms.   Emergency Department Resource Guide 1) Find a Doctor and Pay Out of Pocket Although you won't have to find out who is covered by your insurance plan, it is a good idea to ask around and get recommendations. You will then need to call the office and see if the doctor you have chosen will accept you as a new patient and what types of options they offer for patients who are self-pay. Some doctors offer discounts or will set up payment plans for their patients who do not have insurance, but you will need to ask so you aren't surprised when you get to your appointment.  2) Contact Your Local Health Department Not all health departments have doctors that can see patients for sick visits, but many do, so it is worth a call to see if yours does. If you don't know where your local health department is, you can check in your phone book. The CDC also has a tool to help you locate your state's health department, and many state websites also have listings of all of  their local health departments.  3) Find a [facility] If your illness is not likely to be very severe or complicated, you may want to try a walk in clinic. These are popping up all over the country in pharmacies, drugstores, and shopping centers. They're usually staffed by nurse practitioners or physician assistants that have been trained to treat common illnesses and complaints. They're usually fairly quick and inexpensive. However, if you have serious medical issues or chronic medical problems, these are probably not your best  option.  No Primary Care Doctor: Call Health Connect at  [phone] - they can help you locate a primary care doctor that  accepts your insurance, provides certain services, etc. Physician Referral Service- [phone]  Chronic Pain Problems: Organization         Address  Phone   Notes  [facility]  [phone] Patients need to be referred by their primary care doctor.   Medication Assistance: Organization         Address  Phone   Notes  [facility] Medication [facility] [address] [place], [place] [address] [phone] --Must be a resident of [facility] -- Must have NO insurance coverage whatsoever (no Medicaid/ Medicare, etc.) -- The pt. MUST have a primary care doctor that directs their care regularly and follows them in the community   [facility]  [phone]   [employer]  [phone]    Agencies that provide inexpensive medical care: Organization         Address  Phone   Notes  [place]  [phone]   [facility]    [phone]   [facility] [place], [place] [address] [phone]   [place] [address], [place] [phone]   [facility]anned [facility]    [phone]   [facility]    [phone]   [place] [facility] [place] [address], [place] Phone:  [phone], Fax:  [phone] Hours of Operation:  9 am - 6 pm, M-F.  Also accepts Medicaid/Medicare and self-pay.  [facility] for [place] [address], [address], [place] Phone: [phone], Fax: [phone]. Hours of Operation:  8:30 am - 5:30 pm, M-F.  Also accepts Medicaid and self-pay.  [facility] [place] [address] [place] Phone: [phone]   [place], [place], [place] [phone], Ext. [phone] Mondays & Thursdays: 7-9 AM.  First 15 patients  are seen on a first come, first serve basis.    [place] Providers:  Organization         Address  Phone   Notes  [facility] [address], [place] [phone] Also accepts self-pay patients.  [facility] [address], [place]  [phone]   [place], [address], [place] [phone]   [facility] [address], [place] [phone]   Lucianne Lei [address], [place]   [phone] Only accepts [place] patients after they have their name applied to their card.   Self-Pay (no insurance) in [facility]:  Organization         Address  Phone   Notes  Sickle Cell Patients, [employer]  Internal Medicine [place] [phone]   [facility] [place] [phone]   [facility] [place]  [place], [address], [place] [phone]   [facility]/Dr. Osei-Bonsu  [address], [place] or [address], [place] [phone] Phone number for both [place] and [place] locations is the same.  [facility] [address] [place] [phone]   [facility] [address], [place] or [address] [phone]   [facility] [address] [place] [phone], phone; [phone], fax Sees patients 1st and 3rd Saturday of every month.  Must not qualify for public or private insurance (i.e. Medicaid, Medicare, [place] Health Choice, Veterans' Benefits)  Household income should be no more than 200% of the poverty level The clinic cannot treat you if you are pregnant or think you are pregnant  Sexually transmitted diseases are not treated at the clinic.

## 2019-05-29 NOTE — ED Notes (Signed)
C/O pain a "9" to bilateral hands, knees, ankles and feet.   Able to rest without acute outward pain distress.

## 2019-05-29 NOTE — ED Provider Notes (Signed)
[facility] EMERGENCY DEPARTMENT Provider Note   CSN: [id number] Arrival date & time: 05/29/19  7342    History   Chief Complaint Chief Complaint  Patient presents with  . Joint Pain    HPI [name] is a 58 y.o. male with a PMH of idiopathic chronic gout, HTN, HLD, T2DM, and gouty bursitis presenting with pain in hands, feet, elbows, and knees bilaterally onset July 4th. Patient describes pain as an ache. Patient states he was prescribed Colchicine, but did not have it available until yesterday. Patient denies taking Victoza for a few days. Patient denies fever, chills, nausea, vomiting, abdominal pain, cough, congestion, sick contacts, or recent travel. Patient denies injury. Patient reports pain is similar to previous gout attacks. Patient reports difficulty ambulating due to pain. Patient reports movement makes symptoms worse and nothing makes symptoms better. Patient reports joint swelling and pain. Patient denies erythema, numbness, or paresthesias. Patient denies tobacco, alcohol, or drug use. Patient denies any changes in diet.      HPI  Past Medical History:  Diagnosis Date  . Gouty bursitis, elbow  10/18/2015  . Hx of gout   . Hyperlipidemia   . Hypertension   . Knee pain, chronic   . Type II diabetes mellitus [facility])     Patient Active Problem List   Diagnosis Date Noted  . Class 3 severe obesity due to excess calories with serious comorbidity and body mass index (BMI) of 45.0 to 49.9 in adult ([place]) 09/23/2018  . Diabetic neuropathy ([place]) 09/08/2017  . Insomnia 12/26/2016  . Scrotal abscess 10/09/2016  . Acute meniscal tear of right knee 12/29/2015  . Acute meniscal tear of left knee 12/29/2015  . Other specified crystal arthropathies, left knee 12/29/2015  . Other specified crystal arthropathies, right knee 12/29/2015  . Arthritis of knee   . Inability to ambulate due to knee   . Microcytic anemia   . Bilateral knee pain 12/23/2015  .  Hypertension 12/23/2015  . Chronic mid back pain 12/23/2015  . Gout 12/23/2015  . Osteoarthritis of both knees 12/23/2015  . Intractable pain 12/23/2015  . Normocytic anemia 12/23/2015  . Gouty bursitis, elbow  10/18/2015  . IDDM (insulin dependent diabetes mellitus) ([place]) 10/18/2015    Past Surgical History:  Procedure Laterality Date  . KNEE ARTHROSCOPY Bilateral 12/29/2015   Procedure: ARTHROSCOPY KNEE BILATERAL WITH  PARTIAL MEDIAL AND LATERAL MENISCECTOMY AND FOUR COMPARTMENT SYNOVECTOMY , CHONDRALPLASTIES, PATELLA-FEMORAL CHONDRALPLASTIES BILATERALLY;  Surgeon: Dorna Leitz, MD;  Location: [facility];  Service: Orthopedics;  Laterality: Bilateral;  . NO PAST SURGERIES          Home Medications    Prior to Admission medications   Medication Sig Start Date End Date Taking? Authorizing Provider  ACCU-CHEK FASTCLIX LANCETS MISC Use as directed three times daily 08/11/18  Yes Newlin, Enobong, MD  acetaminophen-codeine (TYLENOL #3) 300-30 MG tablet Take 1 tablet by mouth every 6 (six) hours as needed for moderate pain. 02/19/18  Yes Argentina Donovan, PA-C  aspirin 81 MG EC tablet Take 1 tablet (81 mg total) by mouth daily. 12/01/17  Yes Newlin, Charlane Ferretti, MD  atorvastatin (LIPITOR) 40 MG tablet TAKE 1 TABLET BY MOUTH DAILY AT 6 PM. 05/19/19  Yes Newlin, Enobong, MD  benzonatate (TESSALON) 100 MG capsule Take 200 mg by mouth 2 (two) times daily as needed for cough.   Yes [provider]  Blood Glucose Monitoring Suppl (ACCU-CHEK GUIDE) w/Device KIT 1 each by Does not apply route  3 (three) times daily. 08/10/18  Yes Charlott Rakes, MD  cetirizine (ZYRTEC) 10 MG tablet TAKE 1 TABLET (10 MG TOTAL) BY MOUTH DAILY. 05/05/18  Yes Charlott Rakes, MD  colchicine 0.6 MG tablet Take 1 tablet (0.6 mg total) by mouth daily. 05/19/19  Yes Charlott Rakes, MD  diclofenac sodium (VOLTAREN) 1 % GEL Apply 2 g topically 4 (four) times daily. 07/02/18  Yes Leandrew Koyanagi, MD  FEROSUL 325 (65 Fe) MG tablet TAKE 1  TABLET BY MOUTH DAILY WITH BREAKFAST. 10/06/17  Yes Charlott Rakes, MD  furosemide (LASIX) 20 MG tablet Take 1 tablet (20 mg total) by mouth daily. 05/19/19  Yes Charlott Rakes, MD  gabapentin (NEURONTIN) 300 MG capsule Take 1 capsule (300 mg total) by mouth 2 (two) times daily. 03/30/19  Yes Charlott Rakes, MD  glucose blood (ACCU-CHEK GUIDE) test strip Use as instructed three times daily 08/10/18  Yes Newlin, Enobong, MD  insulin aspart protamine- aspart (NOVOLOG MIX 70/30) (70-30) 100 UNIT/ML injection Inject 0.5 mLs (50 Units total) into the skin 2 (two) times daily with a meal. 05/19/19  Yes Newlin, Enobong, MD  Insulin Pen Needle (TRUEPLUS PEN NEEDLES) 32G X 4 MM MISC Use as directed daily to administer victoza 05/17/19  Yes Newlin, Enobong, MD  Insulin Syringe-Needle U-100 (TRUEPLUS INSULIN SYRINGE) 30G X 5/16" 1 ML MISC USE TWICE DAILY 05/17/19  Yes Newlin, Enobong, MD  liraglutide (VICTOZA) 18 MG/3ML SOPN Inject 0.2 mLs (1.2 mg total) into the skin daily with breakfast. 05/17/19  Yes Newlin, Enobong, MD  lisinopril-hydrochlorothiazide (ZESTORETIC) 20-12.5 MG tablet Take 2 tablets by mouth daily. 05/17/19  Yes Charlott Rakes, MD  meloxicam (MOBIC) 7.5 MG tablet Take 1 tablet (7.5 mg total) by mouth 2 (two) times daily as needed for pain. 07/02/18  Yes Leandrew Koyanagi, MD  metFORMIN (GLUCOPHAGE) 500 MG tablet Take 2 tablets (1,000 mg total) by mouth 2 (two) times daily with a meal. 03/30/19  Yes Newlin, Enobong, MD  metoprolol succinate (TOPROL-XL) 50 MG 24 hr tablet Take 1 tablet (50 mg total) by mouth daily. 05/19/19  Yes Charlott Rakes, MD  HYDROcodone-acetaminophen (NORCO/VICODIN) 5-325 MG tablet Take 1 tablet by mouth every 6 (six) hours as needed for severe pain. 05/29/19   Darlin Drop P, PA-C  indomethacin (INDOCIN) 50 MG capsule Take 1 capsule (50 mg total) by mouth 3 (three) times daily with meals for 5 days. 05/29/19 06/03/19  Darlin Drop P, PA-C  traZODone (DESYREL) 50 MG tablet Take 1 tablet  (50 mg total) by mouth at bedtime. Patient not taking: Reported on 03/30/2019 12/01/17   Charlott Rakes, MD    Family History Family History  Problem Relation Age of Onset  . Diabetes Mother   . Diabetes Father     Social History Social History   Tobacco Use  . Smoking status: Never Smoker  . Smokeless tobacco: Never Used  Substance Use Topics  . Alcohol use: No  . Drug use: No     Allergies   Patient has no known allergies.   Review of Systems Review of Systems  Constitutional: Negative for chills, diaphoresis and fever.  HENT: Negative for congestion, rhinorrhea and sore throat.   Respiratory: Negative for cough and shortness of breath.   Cardiovascular: Negative for chest pain.  Gastrointestinal: Negative for abdominal pain, nausea and vomiting.  Endocrine: Negative for cold intolerance and heat intolerance.  Musculoskeletal: Positive for arthralgias, gait problem and joint swelling. Negative for back pain.  Skin: Negative for rash.  Allergic/Immunologic: Negative for immunocompromised state.  Neurological: Negative for weakness and numbness.  Hematological: Negative for adenopathy.    Physical Exam Updated Vital Signs BP 108/75   Pulse 82   Temp 98.3 F (36.8 C) (Temporal)   Resp 15   Wt 121.1 kg   SpO2 94%   BMI 47.29 kg/m   Physical Exam Vitals signs and nursing note reviewed.  Constitutional:      General: He is not in acute distress.    Appearance: He is well-developed. He is not diaphoretic.  HENT:     Head: Normocephalic and atraumatic.     Mouth/Throat:     Mouth: Mucous membranes are moist.     Pharynx: No posterior oropharyngeal erythema.  Eyes:     Extraocular Movements: Extraocular movements intact.     Conjunctiva/sclera: Conjunctivae normal.     Pupils: Pupils are equal, round, and reactive to light.  Neck:     Musculoskeletal: Normal range of motion.  Cardiovascular:     Rate and Rhythm: Normal rate and regular rhythm.     Heart  sounds: Normal heart sounds. No murmur. No friction rub. No gallop.   Pulmonary:     Effort: Pulmonary effort is normal. No respiratory distress.     Breath sounds: Normal breath sounds. No wheezing or rales.  Abdominal:     General: There is no distension.     Palpations: Abdomen is soft.     Tenderness: There is no abdominal tenderness. There is no guarding.  Musculoskeletal:        General: Swelling and tenderness present. No deformity or signs of injury.     Right elbow: He exhibits decreased range of motion, swelling and effusion. He exhibits no deformity and no laceration. Tenderness found.     Left elbow: He exhibits decreased range of motion. He exhibits no swelling, no effusion, no deformity and no laceration.     Right wrist: He exhibits decreased range of motion, tenderness and bony tenderness. He exhibits no swelling and no effusion.     Left wrist: He exhibits decreased range of motion, tenderness, bony tenderness and swelling.     Right knee: He exhibits decreased range of motion. He exhibits no swelling, no effusion, no ecchymosis and no deformity. Tenderness found.     Left knee: He exhibits decreased range of motion. He exhibits no swelling, no effusion and no ecchymosis. Tenderness found.     Right ankle: He exhibits decreased range of motion and swelling. He exhibits no ecchymosis, no deformity and no laceration. Tenderness.     Left ankle: He exhibits decreased range of motion and swelling. He exhibits no ecchymosis, no deformity and no laceration. Tenderness.     Right foot: Decreased range of motion. Normal capillary refill. Tenderness, bony tenderness and swelling present. No crepitus or deformity.     Left foot: Decreased range of motion. Normal capillary refill. Tenderness, bony tenderness and swelling present. No crepitus, deformity or laceration.  Skin:    General: Skin is warm.     Findings: No erythema or rash.  Neurological:     Mental Status: He is alert.       ED Treatments / Results  Labs (all labs ordered are listed, but only abnormal results are displayed) Labs Reviewed  CBC WITH DIFFERENTIAL/PLATELET - Abnormal; Notable for the following components:      Result Value   Hemoglobin 11.4 (*)    HCT 36.3 (*)    MCV 75.3 (*)  MCH 23.7 (*)    RDW 15.6 (*)    Monocytes Absolute 1.1 (*)    All other components within normal limits  BASIC METABOLIC PANEL - Abnormal; Notable for the following components:   Sodium 133 (*)    Chloride 93 (*)    Glucose, Bld 300 (*)    BUN 43 (*)    Creatinine, Ser 1.57 (*)    GFR calc non Af Amer 48 (*)    GFR calc Af Amer 55 (*)    Anion gap 16 (*)    All other components within normal limits  URINALYSIS, ROUTINE W REFLEX MICROSCOPIC - Abnormal; Notable for the following components:   APPearance HAZY (*)    Hgb urine dipstick MODERATE (*)    Protein, ur 30 (*)    All other components within normal limits  CBG MONITORING, ED - Abnormal; Notable for the following components:   Glucose-Capillary 259 (*)    All other components within normal limits    EKG EKG Interpretation  Date/Time:  Saturday May 29 2019 06:57:05 EDT Ventricular Rate:  94 PR Interval:    QRS Duration: 89 QT Interval:  368 QTC Calculation: 461 R Axis:   -98 Text Interpretation:  Sinus rhythm LAD, consider left anterior fascicular block improvement on diffuse t wave changes noted on EKG from 06/15/2011 Confirmed by Virgel Manifold [phone]) on 05/29/2019 7:27:14 AM   Radiology No results found.  Procedures Procedures (including critical care time)  Medications Ordered in ED Medications  colchicine tablet 0.6 mg (has no administration in time range)  ketorolac (TORADOL) 30 MG/ML injection 30 mg (30 mg Intravenous Given 05/29/19 0829)  sodium chloride 0.9 % bolus 500 mL (0 mLs Intravenous Stopped 05/29/19 0929)  sodium chloride 0.9 % bolus 1,000 mL (1,000 mLs Intravenous New Bag/Given 05/29/19 0930)  morphine 4 MG/ML injection 4  mg (4 mg Intravenous Given 05/29/19 0929)     Initial Impression / Assessment and Plan / ED Course  I have reviewed the triage vital signs and the nursing notes.  Pertinent labs & imaging results that were available during my care of the patient were reviewed by me and considered in my medical decision making (see chart for details).  Clinical Course as of May 28 1234  Sat May 29, 2019  0846 WBCs within normal limits. Low hemoglobin at 11.4. This appears to be at baseline when compared to previous values.  CBC with Differential(!) [AH]  0925 BMP reveals hyponatremia at 133, low chloride at 92, hyperglycemia at 300, elevated creatinine at 1.57, and anion gap at 16.  Basic metabolic panel(!) [AH]  5638 Reassessed patient. Patient states pain has improved while in the ER.   [AH]    Clinical Course User Index [AH] Arville Lime, PA-C      Patient presents with joint pain and swelling in multiple joints. WBCs are within normal limits and patient is afebrile. Provided pain medicine, IVF, and NSAIDs in the ER. Upon reassessment, patient has been sleeping comfortably in the ER. Do not suspect x rays are necessary due to no injury and a history of gout attacks. Hyperglycemia noted at 300 with an anion gap of 16. Provided IVF and blood glucose improved to 259. No ketones in UA and patient denies any nausea, vomiting, diarrhea, or abdominal pain. Prescribed a short course of pain medicine and indomethacin. Advised patient to continue colchicine as previously prescribed. Advised patient to follow up with PCP. Discussed importance of taking his medications. Discussed dietary  modifications to avoid gouty attacks. Discussed return precautions. Patient states he understands and agrees with plan.   Findings and plan of care discussed with supervising physician Dr. Wilson Singer.  Final Clinical Impressions(s) / ED Diagnoses   Final diagnoses:  Acute gout of multiple sites, unspecified cause    ED Discharge  Orders         Ordered    indomethacin (INDOCIN) 50 MG capsule  3 times daily with meals     05/29/19 1231    HYDROcodone-acetaminophen (NORCO/VICODIN) 5-325 MG tablet  Every 6 hours PRN     05/29/19 1231           Arville Lime, [place] 05/29/19 1236    Virgel Manifold, MD 05/30/19 [phone]

## 2019-06-08 ENCOUNTER — Telehealth: Payer: Self-pay | Admitting: Family Medicine

## 2019-06-08 NOTE — Telephone Encounter (Signed)
Pt states bilateral ankle & feet swelling. Pt request work in appt. None available. Pt states went to ED today and was sent to PCP. Pt request triage and possible medication to treat swelling. Pt ph [phone]. [facility].

## 2019-06-08 NOTE — Telephone Encounter (Signed)
Patients medication was filled 05/19/2019 through [facility] delivery. Patient is reaching out to the service department.

## 2019-06-14 ENCOUNTER — Other Ambulatory Visit: Payer: Self-pay

## 2019-06-14 ENCOUNTER — Ambulatory Visit: Payer: Medicare HMO | Admitting: Internal Medicine

## 2019-06-14 ENCOUNTER — Encounter: Payer: Self-pay | Admitting: Nurse Practitioner

## 2019-06-14 ENCOUNTER — Ambulatory Visit: Payer: Medicare HMO | Attending: Internal Medicine | Admitting: Nurse Practitioner

## 2019-06-14 VITALS — BP 111/73 | HR 80 | Temp 98.8°F | Ht 63.0 in | Wt 256.0 lb

## 2019-06-14 DIAGNOSIS — E871 Hypo-osmolality and hyponatremia: Secondary | ICD-10-CM | POA: Diagnosis not present

## 2019-06-14 DIAGNOSIS — E1165 Type 2 diabetes mellitus with hyperglycemia: Secondary | ICD-10-CM

## 2019-06-14 DIAGNOSIS — R600 Localized edema: Secondary | ICD-10-CM | POA: Diagnosis not present

## 2019-06-14 DIAGNOSIS — Z794 Long term (current) use of insulin: Secondary | ICD-10-CM

## 2019-06-14 DIAGNOSIS — E1169 Type 2 diabetes mellitus with other specified complication: Secondary | ICD-10-CM

## 2019-06-14 DIAGNOSIS — M1A0490 Idiopathic chronic gout, unspecified hand, without tophus (tophi): Secondary | ICD-10-CM

## 2019-06-14 LAB — GLUCOSE, POCT (MANUAL RESULT ENTRY): POC Glucose: 63 mg/dl — AB (ref 70–99)

## 2019-06-14 MED ORDER — INDOMETHACIN 50 MG PO CAPS: 50.0000 mg | ORAL_CAPSULE | Freq: Three times a day (TID) | ORAL | 0 refills | Status: AC

## 2019-06-14 MED ORDER — COLCHICINE 0.6 MG PO TABS: 0.6000 mg | ORAL_TABLET | Freq: Every day | ORAL | 0 refills | Status: DC

## 2019-06-14 MED ORDER — FUROSEMIDE 20 MG PO TABS: 20.0000 mg | ORAL_TABLET | Freq: Every day | ORAL | 0 refills | Status: DC

## 2019-06-14 MED FILL — INDOMETHACIN 50 MG CAPSULE: 50 | 5 days supply | Qty: 15 | Fill #0

## 2019-06-14 NOTE — Progress Notes (Signed)
Assessment & Plan:  Doye was seen today for edema.  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 -     colchicine 0.6 MG tablet; Take 1 tablet (0.6 mg total) by mouth daily. 90 day supply  Type 2 diabetes mellitus with other specified complication, with long-term current use of insulin (HCC) -     Glucose (CBG)  Pedal edema -     Discontinue: furosemide (LASIX) 20 MG tablet; Take 1 tablet (20 mg total) by mouth daily. -     Basic Metabolic Panel -     furosemide (LASIX) 20 MG tablet; Take 1 tablet (20 mg total) by mouth daily. 90day supply  Hyponatremia -     Basic Metabolic Panel    Patient has been counseled on age-appropriate routine health concerns for screening and prevention. These are reviewed and up-to-date. Referrals have been placed accordingly. Immunizations are up-to-date or declined.    Subjective:   Chief Complaint  Patient presents with  . Edema    Patient is here for his swelling legs and foot. Pt. stated he have not recieve his medication from [facility] mail delivery and he spoke to them Friday.    HPI [name] 58 y.o. male presents to office today with complaints of  Bilateral leg, ankle and foot swelling ongoing since July 4th.  He was evaluated in the ED on 05/29/2019 for the same symptoms and was prescribed indomethacin 50 mg 3 times daily, hydrocodone-acetaminophen and instructed to continue colchicine.  Unfortunately medications were sent through [facility] mail order and patient has not received any of his medications and it is now 16 days later.  He also cannot recall the last time he took furosemide so likely has been without his loop diuretic for over a month.  He declines any other medication refills today aside from his colchicine, furosemide and indomethacin. He is not diet  compliant in regard to sodium intake or avoidance of high purine foods.  He denies any shortness of breath or chest pain today. Uses a walker for mobility and is morbidly obese with BMI 45. He also is non adherent with wearing compression socks daily.         Review of Systems  Constitutional: Negative for fever, malaise/fatigue and weight loss.  HENT: Negative.  Negative for nosebleeds.   Eyes: Negative.  Negative for blurred vision, double vision and photophobia.  Respiratory: Negative.  Negative for cough, shortness of breath and wheezing.   Cardiovascular: Negative.  Negative for chest pain, palpitations and leg swelling.  Gastrointestinal: Negative.  Negative for heartburn, nausea and vomiting.  Musculoskeletal: Positive for joint pain. Negative for myalgias.  Neurological: Positive for weakness (uses walker). Negative for dizziness, focal weakness, seizures and headaches.  Psychiatric/Behavioral: Negative.  Negative for suicidal ideas.    Past Medical History:  Diagnosis Date  . Gouty bursitis, elbow  10/18/2015  . Hx of gout   . Hyperlipidemia   . Hypertension   . Knee pain, chronic   . Type II diabetes mellitus ([place])     Past Surgical History:  Procedure Laterality Date  . KNEE ARTHROSCOPY Bilateral 12/29/2015   Procedure: ARTHROSCOPY KNEE BILATERAL WITH  PARTIAL MEDIAL AND LATERAL MENISCECTOMY AND FOUR COMPARTMENT SYNOVECTOMY , CHONDRALPLASTIES, PATELLA-FEMORAL CHONDRALPLASTIES BILATERALLY;  Surgeon: Dorna Leitz, MD;  Location: [facility];  Service: Orthopedics;  Laterality: Bilateral;  . NO PAST SURGERIES      Family History  Problem Relation Age of Onset  . Diabetes Mother   . Diabetes Father     Social History Reviewed with no changes to be made today.   Outpatient Medications Prior to Visit  Medication Sig Dispense Refill  . ACCU-CHEK FASTCLIX LANCETS MISC Use as directed three times daily 100 each 12  . aspirin 81 MG EC tablet Take 1 tablet (81 mg total) by  mouth daily. 90 tablet 1  . atorvastatin (LIPITOR) 40 MG tablet TAKE 1 TABLET BY MOUTH DAILY AT 6 PM. 90 tablet 0  . benzonatate (TESSALON) 100 MG capsule Take 200 mg by mouth 2 (two) times daily as needed for cough.    . Blood Glucose Monitoring Suppl (ACCU-CHEK GUIDE) w/Device KIT 1 each by Does not apply route 3 (three) times daily. 1 kit 0  . cetirizine (ZYRTEC) 10 MG tablet TAKE 1 TABLET (10 MG TOTAL) BY MOUTH DAILY. 30 tablet 3  . diclofenac sodium (VOLTAREN) 1 % GEL Apply 2 g topically 4 (four) times daily. 1 Tube 5  . FEROSUL 325 (65 Fe) MG tablet TAKE 1 TABLET BY MOUTH DAILY WITH BREAKFAST. 30 tablet 2  . gabapentin (NEURONTIN) 300 MG capsule Take 1 capsule (300 mg total) by mouth 2 (two) times daily. 180 capsule 1  . glucose blood (ACCU-CHEK GUIDE) test strip Use as instructed three times daily 100 each 12  . HYDROcodone-acetaminophen (NORCO/VICODIN) 5-325 MG tablet Take 1 tablet by mouth every 6 (six) hours as needed for severe pain. 5 tablet 0  . insulin aspart protamine- aspart (NOVOLOG MIX 70/30) (70-30) 100 UNIT/ML injection Inject 0.5 mLs (50 Units total) into the skin 2 (two) times daily with a meal. 90 mL 0  . Insulin Pen Needle (TRUEPLUS PEN NEEDLES) 32G X 4 MM MISC Use as directed daily to administer victoza 100 each 12  . Insulin Syringe-Needle U-100 (TRUEPLUS INSULIN SYRINGE) 30G X 5/16" 1 ML MISC USE TWICE DAILY 100 each 12  . liraglutide (VICTOZA) 18 MG/3ML SOPN Inject 0.2 mLs (1.2 mg total) into the skin daily with breakfast. 9 mL 1  . lisinopril-hydrochlorothiazide (ZESTORETIC) 20-12.5 MG tablet Take 2 tablets by mouth daily. 180 tablet 1  . meloxicam (MOBIC) 7.5 MG tablet Take 1 tablet (7.5 mg total) by mouth 2 (two) times daily as needed for pain. 60 tablet 2  . metFORMIN (GLUCOPHAGE) 500 MG tablet Take 2 tablets (1,000 mg total) by mouth 2 (two) times daily with a meal. 360 tablet 1  . metoprolol succinate (TOPROL-XL) 50 MG 24 hr tablet Take 1 tablet (50 mg total) by  mouth daily. 90 tablet 0  . colchicine 0.6 MG tablet Take 1 tablet (0.6 mg total) by mouth daily. 90 tablet 0  . furosemide (LASIX) 20 MG tablet Take 1 tablet (20 mg total) by mouth daily. 90 tablet 0  . acetaminophen-codeine (TYLENOL #3) 300-30 MG tablet Take 1 tablet by mouth every 6 (six) hours as needed for moderate pain. (Patient not taking: Reported on 06/14/2019) 20 tablet 0  . traZODone (DESYREL) 50 MG tablet Take 1 tablet (50 mg total) by mouth at bedtime. (Patient not taking: Reported on 03/30/2019) 30 tablet 3   No facility-administered medications prior to visit.     No Known Allergies     Objective:    BP 111/73 (BP Location: Right Arm, Patient Position: Sitting, Cuff Size: Large)  Pulse 80   Temp 98.8 F (37.1 C) (Oral)   Ht '5\' 3"'  (1.6 m)   Wt 256 lb (116.1 kg)   SpO2 100%   BMI 45.35 kg/m  Wt Readings from Last 3 Encounters:  06/14/19 256 lb (116.1 kg)  05/29/19 266 lb 15.6 oz (121.1 kg)  03/30/19 267 lb (121.1 kg)    Physical Exam Vitals signs and nursing note reviewed.  Constitutional:      Appearance: He is well-developed.  HENT:     Head: Normocephalic and atraumatic.  Neck:     Musculoskeletal: Normal range of motion.  Cardiovascular:     Rate and Rhythm: Normal rate and regular rhythm.     Heart sounds: Normal heart sounds. No murmur. No friction rub. No gallop.   Pulmonary:     Effort: Pulmonary effort is normal. No tachypnea or respiratory distress.     Breath sounds: Normal breath sounds. No decreased breath sounds, wheezing, rhonchi or rales.  Chest:     Chest wall: No tenderness.  Abdominal:     General: Bowel sounds are normal.     Palpations: Abdomen is soft.  Musculoskeletal:        General: Tenderness present.     Right ankle: He exhibits decreased range of motion and swelling.     Left ankle: He exhibits decreased range of motion and swelling.     Right lower leg: Edema (non pitting; slight tenderness with deep palpation.) present.      Left lower leg: Edema (non pitting, slight tenderness with deep palpation ) present.     Right foot: Decreased range of motion. Swelling present.     Left foot: Decreased range of motion. Swelling present.  Skin:    General: Skin is warm and dry.  Neurological:     Mental Status: He is alert and oriented to person, place, and time.     Coordination: Coordination normal.     Gait: Gait abnormal (uses walker).  Psychiatric:        Behavior: Behavior normal. Behavior is cooperative.        Thought Content: Thought content normal.        Judgment: Judgment normal.        Patient has been counseled extensively about nutrition and exercise as well as the importance of adherence with medications and regular follow-up. The patient was given clear instructions to go to ER or return to medical center if symptoms don't improve, worsen or new problems develop. The patient verbalized understanding.   Follow-up: Return for follow up with PCP in 2 weeks or first available.   Gildardo Pounds, FNP-BC [facility] and [facility] [place], [place]   06/14/2019, 12:03 PM

## 2019-06-15 ENCOUNTER — Other Ambulatory Visit: Payer: Self-pay | Admitting: Nurse Practitioner

## 2019-06-15 DIAGNOSIS — E790 Hyperuricemia without signs of inflammatory arthritis and tophaceous disease: Secondary | ICD-10-CM

## 2019-06-15 LAB — BASIC METABOLIC PANEL
BUN/Creatinine Ratio: 17 (ref 9–20)
BUN: 26 mg/dL — ABNORMAL HIGH (ref 6–24)
CO2: 26 mmol/L (ref 20–29)
Calcium: 9.8 mg/dL (ref 8.7–10.2)
Chloride: 100 mmol/L (ref 96–106)
Creatinine, Ser: 1.57 mg/dL — ABNORMAL HIGH (ref 0.76–1.27)
GFR calc Af Amer: 55 mL/min/{1.73_m2} — ABNORMAL LOW (ref >59)
GFR calc non Af Amer: 48 mL/min/{1.73_m2} — ABNORMAL LOW (ref >59)
Glucose: 61 mg/dL — ABNORMAL LOW (ref 65–99)
Potassium: 4.5 mmol/L (ref 3.5–5.2)
Sodium: 144 mmol/L (ref 134–144)

## 2019-06-15 LAB — URIC ACID: Uric Acid: 11.8 mg/dL — ABNORMAL HIGH (ref 3.7–8.6)

## 2019-06-16 ENCOUNTER — Telehealth: Payer: Self-pay

## 2019-06-16 NOTE — Telephone Encounter (Signed)
CMA attempt to reach patient to inform on lab results.  No answer and left a VM for a call back.

## 2019-06-16 NOTE — Telephone Encounter (Signed)
-----   Message from Gildardo Pounds, NP sent at 06/15/2019  7:17 PM EDT ----- Uric acid is very high. Will need to re evaluated in a few weeks. Will need to order xrays of your feet to determine how severe the gout is in your joints.

## 2019-06-17 ENCOUNTER — Ambulatory Visit: Payer: Medicare HMO | Admitting: Nurse Practitioner

## 2019-06-18 NOTE — Telephone Encounter (Signed)
CMA spoke to patient and inform on results, Xrays.  Pt. Understood.

## 2019-06-29 ENCOUNTER — Encounter: Payer: Self-pay | Admitting: Family Medicine

## 2019-06-29 ENCOUNTER — Ambulatory Visit: Payer: Medicare HMO | Attending: Family Medicine | Admitting: Family Medicine

## 2019-06-29 ENCOUNTER — Other Ambulatory Visit: Payer: Self-pay

## 2019-06-29 VITALS — BP 150/96 | HR 82 | Temp 98.3°F | Ht 63.0 in | Wt 263.0 lb

## 2019-06-29 DIAGNOSIS — I10 Essential (primary) hypertension: Secondary | ICD-10-CM

## 2019-06-29 DIAGNOSIS — E1169 Type 2 diabetes mellitus with other specified complication: Secondary | ICD-10-CM

## 2019-06-29 DIAGNOSIS — E1149 Type 2 diabetes mellitus with other diabetic neurological complication: Secondary | ICD-10-CM

## 2019-06-29 DIAGNOSIS — Z794 Long term (current) use of insulin: Secondary | ICD-10-CM

## 2019-06-29 DIAGNOSIS — R600 Localized edema: Secondary | ICD-10-CM

## 2019-06-29 DIAGNOSIS — E119 Type 2 diabetes mellitus without complications: Secondary | ICD-10-CM | POA: Diagnosis not present

## 2019-06-29 DIAGNOSIS — M109 Gout, unspecified: Secondary | ICD-10-CM

## 2019-06-29 DIAGNOSIS — G4709 Other insomnia: Secondary | ICD-10-CM

## 2019-06-29 LAB — POCT GLYCOSYLATED HEMOGLOBIN (HGB A1C): HbA1c, POC (controlled diabetic range): 7 % (ref 0.0–7.0)

## 2019-06-29 LAB — GLUCOSE, POCT (MANUAL RESULT ENTRY): POC Glucose: 70 mg/dl (ref 70–99)

## 2019-06-29 MED ORDER — VICTOZA 18 MG/3ML ~~LOC~~ SOPN: 1.2000 mg | PEN_INJECTOR | Freq: Every day | SUBCUTANEOUS | 1 refills | Status: DC

## 2019-06-29 MED ORDER — METFORMIN HCL 500 MG PO TABS: 1000.0000 mg | ORAL_TABLET | Freq: Two times a day (BID) | ORAL | 1 refills | Status: DC

## 2019-06-29 MED ORDER — ATORVASTATIN CALCIUM 40 MG PO TABS: ORAL_TABLET | ORAL | 0 refills | Status: DC

## 2019-06-29 MED ORDER — LISINOPRIL-HYDROCHLOROTHIAZIDE 20-12.5 MG PO TABS: 2.0000 | ORAL_TABLET | Freq: Every day | ORAL | 1 refills | Status: DC

## 2019-06-29 MED ORDER — TRAZODONE HCL 50 MG PO TABS: 50.0000 mg | ORAL_TABLET | Freq: Every day | ORAL | 3 refills | Status: DC

## 2019-06-29 MED ORDER — FUROSEMIDE 40 MG PO TABS: 40.0000 mg | ORAL_TABLET | Freq: Every day | ORAL | 3 refills | Status: DC

## 2019-06-29 MED ORDER — GABAPENTIN 300 MG PO CAPS: 300.0000 mg | ORAL_CAPSULE | Freq: Two times a day (BID) | ORAL | 1 refills | Status: DC

## 2019-06-29 MED ORDER — PREDNISONE 20 MG PO TABS: 20.0000 mg | ORAL_TABLET | Freq: Two times a day (BID) | ORAL | 0 refills | Status: DC

## 2019-06-29 MED ORDER — METOPROLOL SUCCINATE ER 50 MG PO TB24: 50.0000 mg | ORAL_TABLET | Freq: Every day | ORAL | 0 refills | Status: DC

## 2019-06-29 MED FILL — traZODone HCL 50 MG TABS: 50 | 90 days supply | Qty: 90 | Fill #0

## 2019-06-29 MED FILL — predniSONE 20 MG TABS: 20 | 5 days supply | Qty: 10 | Fill #0

## 2019-06-29 MED FILL — ACCU-CHEK FASTCLIX LANCETS: 30 days supply | Qty: 102 | Fill #4

## 2019-06-29 MED FILL — FUROSEMIDE 40 MG TAB: 40 | 90 days supply | Qty: 90 | Fill #0

## 2019-06-29 NOTE — Progress Notes (Signed)
Patient wants to use [facility] pharmacy.

## 2019-06-29 NOTE — Patient Instructions (Signed)
Gout  Gout is painful swelling of your joints. Gout is a type of arthritis. It is caused by having too much uric acid in your body. Uric acid is a chemical that is made when your body breaks down substances called purines. If your body has too much uric acid, sharp crystals can form and build up in your joints. This causes pain and swelling. Gout attacks can happen quickly and be very painful (acute gout). Over time, the attacks can affect more joints and happen more often (chronic gout). What are the causes?  Too much uric acid in your blood. This can happen because: ? Your kidneys do not remove enough uric acid from your blood. ? Your body makes too much uric acid. ? You eat too many foods that are high in purines. These foods include organ meats, some seafood, and beer.  Trauma or stress. What increases the risk?  Having a family history of gout.  Being male and middle-aged.  Being male and having gone through menopause.  Being very overweight (obese).  Drinking alcohol, especially beer.  Not having enough water in the body (being dehydrated).  Losing weight too quickly.  Having an organ transplant.  Having lead poisoning.  Taking certain medicines.  Having kidney disease.  Having a skin condition called psoriasis. What are the signs or symptoms? An attack of acute gout usually happens in just one joint. The most common place is the big toe. Attacks often start at night. Other joints that may be affected include joints of the feet, ankle, knee, fingers, wrist, or elbow. Symptoms of an attack may include:  Very bad pain.  Warmth.  Swelling.  Stiffness.  Shiny, red, or purple skin.  Tenderness. The affected joint may be very painful to touch.  Chills and fever. Chronic gout may cause symptoms more often. More joints may be involved. You may also have white or yellow lumps (tophi) on your hands or feet or in other areas near your joints. How is this  treated?  Treatment for this condition has two phases: treating an acute attack and preventing future attacks.  Acute gout treatment may include: ? NSAIDs. ? Steroids. These are taken by mouth or injected into a joint. ? Colchicine. This medicine relieves pain and swelling. It can be given by mouth or through an IV tube.  Preventive treatment may include: ? Taking small doses of NSAIDs or colchicine daily. ? Using a medicine that reduces uric acid levels in your blood. ? Making changes to your diet. You may need to see a food expert (dietitian) about what to eat and drink to prevent gout. Follow these instructions at home: During a gout attack   If told, put ice on the painful area: ? Put ice in a plastic bag. ? Place a towel between your skin and the bag. ? Leave the ice on for 20 minutes, 2-3 times a day.  Raise (elevate) the painful joint above the level of your heart as often as you can.  Rest the joint as much as possible. If the joint is in your leg, you may be given crutches.  Follow instructions from your doctor about what you cannot eat or drink. Avoiding future gout attacks  Eat a low-purine diet. Avoid foods and drinks such as: ? Liver. ? Kidney. ? Anchovies. ? Asparagus. ? Herring. ? Mushrooms. ? Mussels. ? Beer.  Stay at a healthy weight. If you want to lose weight, talk with your doctor. Do not lose weight   too fast.  Start or continue an exercise plan as told by your doctor. Eating and drinking  Drink enough fluids to keep your pee (urine) pale yellow.  If you drink alcohol: ? Limit how much you use to:  0-1 drink a day for women.  0-2 drinks a day for men. ? Be aware of how much alcohol is in your drink. In the [place] one drink equals one 12 oz bottle of beer (355 mL), one 5 oz glass of wine (148 mL), or one 1 oz glass of hard liquor (44 mL). General instructions  Take over-the-counter and prescription medicines only as told by your doctor.  Do  not drive or use heavy machinery while taking prescription pain medicine.  Return to your normal activities as told by your doctor. Ask your doctor what activities are safe for you.  Keep all follow-up visits as told by your doctor. This is important. Contact a doctor if:  You have another gout attack.  You still have symptoms of a gout attack after 10 days of treatment.  You have problems (side effects) because of your medicines.  You have chills or a fever.  You have burning pain when you pee (urinate).  You have pain in your lower back or belly. Get help right away if:  You have very bad pain.  Your pain cannot be controlled.  You cannot pee. Summary  Gout is painful swelling of the joints.  The most common site of pain is the big toe, but it can affect other joints.  Medicines and avoiding some foods can help to prevent and treat gout attacks. This information is not intended to replace advice given to you by your health care provider. Make sure you discuss any questions you have with your health care provider. Document Released: 08/13/2008 Document Revised: 05/27/2018 Document Reviewed: 05/27/2018 Elsevier Patient Education  2020 [employer]  

## 2019-06-29 NOTE — Progress Notes (Signed)
Subjective:  Patient ID: [name], male    DOB: 03/12/1961  Age: 58 y.o. MRN: [id number]  CC: Diabetes   HPI [name] PISARSKI is a 58 year old male with a history of type 2 diabetes mellitus(A1c 7.0), hypertension, hyperlipidemia, gout, osteoarthritis of the knee (status post bilateral knee arthroscopy in the past) who presents today for follow-up of Hypertension and diabetes mellitus. He is concerned about lower extremity edema and this has prevented him from participating in his regular exercise regimen.  Review of his medications indicate he is on Lasix 20 mg. He also complains of edema and pain of his left middle finger. Last month he was treated for an acute gout flare and placed on colchicine and indomethacin.  With regards to his diabetes mellitus his A1c 7.0 which is up from 6.5 previously.  He denies hypoglycemia or numbness in extremities.  Endorses compliance with a diabetic diet.  His knees are stable with no pain at this time.   Past Medical History:  Diagnosis Date  . Gouty bursitis, elbow  10/18/2015  . Hx of gout   . Hyperlipidemia   . Hypertension   . Knee pain, chronic   . Type II diabetes mellitus ([place])     Past Surgical History:  Procedure Laterality Date  . KNEE ARTHROSCOPY Bilateral 12/29/2015   Procedure: ARTHROSCOPY KNEE BILATERAL WITH  PARTIAL MEDIAL AND LATERAL MENISCECTOMY AND FOUR COMPARTMENT SYNOVECTOMY , CHONDRALPLASTIES, PATELLA-FEMORAL CHONDRALPLASTIES BILATERALLY;  Surgeon: Dorna Leitz, MD;  Location: [facility];  Service: Orthopedics;  Laterality: Bilateral;  . NO PAST SURGERIES      Family History  Problem Relation Age of Onset  . Diabetes Mother   . Diabetes Father     No Known Allergies  Outpatient Medications Prior to Visit  Medication Sig Dispense Refill  . ACCU-CHEK FASTCLIX LANCETS MISC Use as directed three times daily 100 each 12  . aspirin 81 MG EC tablet Take 1 tablet (81 mg total) by mouth daily. 90 tablet 1  . Blood Glucose  Monitoring Suppl (ACCU-CHEK GUIDE) w/Device KIT 1 each by Does not apply route 3 (three) times daily. 1 kit 0  . colchicine 0.6 MG tablet Take 1 tablet (0.6 mg total) by mouth daily. 90 day supply 90 tablet 0  . FEROSUL 325 (65 Fe) MG tablet TAKE 1 TABLET BY MOUTH DAILY WITH BREAKFAST. 30 tablet 2  . glucose blood (ACCU-CHEK GUIDE) test strip Use as instructed three times daily 100 each 12  . insulin aspart protamine- aspart (NOVOLOG MIX 70/30) (70-30) 100 UNIT/ML injection Inject 0.5 mLs (50 Units total) into the skin 2 (two) times daily with a meal. 90 mL 0  . Insulin Pen Needle (TRUEPLUS PEN NEEDLES) 32G X 4 MM MISC Use as directed daily to administer victoza 100 each 12  . Insulin Syringe-Needle U-100 (TRUEPLUS INSULIN SYRINGE) 30G X 5/16" 1 ML MISC USE TWICE DAILY 100 each 12  . atorvastatin (LIPITOR) 40 MG tablet TAKE 1 TABLET BY MOUTH DAILY AT 6 PM. 90 tablet 0  . furosemide (LASIX) 20 MG tablet Take 1 tablet (20 mg total) by mouth daily. 90day supply 90 tablet 0  . gabapentin (NEURONTIN) 300 MG capsule Take 1 capsule (300 mg total) by mouth 2 (two) times daily. 180 capsule 1  . liraglutide (VICTOZA) 18 MG/3ML SOPN Inject 0.2 mLs (1.2 mg total) into the skin daily with breakfast. 9 mL 1  . lisinopril-hydrochlorothiazide (ZESTORETIC) 20-12.5 MG tablet Take 2 tablets by mouth daily. 180 tablet 1  .  metFORMIN (GLUCOPHAGE) 500 MG tablet Take 2 tablets (1,000 mg total) by mouth 2 (two) times daily with a meal. 360 tablet 1  . metoprolol succinate (TOPROL-XL) 50 MG 24 hr tablet Take 1 tablet (50 mg total) by mouth daily. 90 tablet 0  . benzonatate (TESSALON) 100 MG capsule Take 200 mg by mouth 2 (two) times daily as needed for cough.    . cetirizine (ZYRTEC) 10 MG tablet TAKE 1 TABLET (10 MG TOTAL) BY MOUTH DAILY. (Patient not taking: Reported on 06/29/2019) 30 tablet 3  . diclofenac sodium (VOLTAREN) 1 % GEL Apply 2 g topically 4 (four) times daily. (Patient not taking: Reported on 06/29/2019) 1 Tube  5  . HYDROcodone-acetaminophen (NORCO/VICODIN) 5-325 MG tablet Take 1 tablet by mouth every 6 (six) hours as needed for severe pain. (Patient not taking: Reported on 06/29/2019) 5 tablet 0  . meloxicam (MOBIC) 7.5 MG tablet Take 1 tablet (7.5 mg total) by mouth 2 (two) times daily as needed for pain. (Patient not taking: Reported on 06/29/2019) 60 tablet 2  . acetaminophen-codeine (TYLENOL #3) 300-30 MG tablet Take 1 tablet by mouth every 6 (six) hours as needed for moderate pain. (Patient not taking: Reported on 06/14/2019) 20 tablet 0  . traZODone (DESYREL) 50 MG tablet Take 1 tablet (50 mg total) by mouth at bedtime. (Patient not taking: Reported on 03/30/2019) 30 tablet 3   No facility-administered medications prior to visit.      ROS Review of Systems  Constitutional: Negative for activity change and appetite change.  HENT: Negative for sinus pressure and sore throat.   Eyes: Negative for visual disturbance.  Respiratory: Negative for cough, chest tightness and shortness of breath.   Cardiovascular: Positive for leg swelling. Negative for chest pain.  Gastrointestinal: Negative for abdominal distention, abdominal pain, constipation and diarrhea.  Endocrine: Negative.   Genitourinary: Negative for dysuria.  Musculoskeletal:       See hpi  Skin: Negative for rash.  Allergic/Immunologic: Negative.   Neurological: Negative for weakness, light-headedness and numbness.  Psychiatric/Behavioral: Negative for dysphoric mood and suicidal ideas.    Objective:  BP (!) 150/96   Pulse 82   Temp 98.3 F (36.8 C) (Oral)   Ht '5\' 3"'  (1.6 m)   Wt 263 lb (119.3 kg)   SpO2 100%   BMI 46.59 kg/m   BP/Weight 06/29/2019 06/14/2019 5/46/5681  Systolic BP 275 170 017  Diastolic BP 96 73 494  Wt. (Lbs) 263 256 266.98  BMI 46.59 45.35 47.29      Physical Exam Constitutional:      Appearance: He is well-developed. He is obese.  Cardiovascular:     Rate and Rhythm: Normal rate.     Heart  sounds: Normal heart sounds. No murmur.  Pulmonary:     Effort: Pulmonary effort is normal.     Breath sounds: Normal breath sounds. No wheezing or rales.  Chest:     Chest wall: No tenderness.  Abdominal:     General: Bowel sounds are normal. There is no distension.     Palpations: Abdomen is soft. There is no mass.     Tenderness: There is no abdominal tenderness.  Musculoskeletal: Normal range of motion.     Right lower leg: Edema (2+ non pitting) present.     Left lower leg: Edema (2+ non pitting) present.     Comments: Edema of left proximal PIP joint with associated tenderness which extends to the dorsum of left hand Right hand is normal  Neurological:     Mental Status: He is alert and oriented to person, place, and time.     CMP Latest Ref Rng & Units 06/14/2019 05/29/2019 03/30/2019  Glucose 65 - 99 mg/dL 61(L) 300(H) 90  BUN 6 - 24 mg/dL 26(H) 43(H) 31(H)  Creatinine 0.76 - 1.27 mg/dL 1.57(H) 1.57(H) 1.42(H)  Sodium 134 - 144 mmol/L 144 133(L) 140  Potassium 3.5 - 5.2 mmol/L 4.5 3.5 4.4  Chloride 96 - 106 mmol/L 100 93(L) 99  CO2 20 - 29 mmol/L '26 24 23  ' Calcium 8.7 - 10.2 mg/dL 9.8 9.1 9.7  Total Protein 6.0 - 8.5 g/dL - - 7.8  Total Bilirubin 0.0 - 1.2 mg/dL - - 0.4  Alkaline Phos 39 - 117 IU/L - - 90  AST 0 - 40 IU/L - - 28  ALT 0 - 44 IU/L - - 31    Lipid Panel     Component Value Date/Time   CHOL 111 08/12/2017 0900   TRIG 152 (H) 08/12/2017 0900   HDL 30 (L) 08/12/2017 0900   CHOLHDL 3.7 08/12/2017 0900   CHOLHDL 4.5 10/10/2016 1125   VLDL 23 10/10/2016 1125   LDLCALC 51 08/12/2017 0900    CBC    Component Value Date/Time   WBC 9.3 05/29/2019 0740   RBC 4.82 05/29/2019 0740   HGB 11.4 (L) 05/29/2019 0740   HCT 36.3 (L) 05/29/2019 0740   PLT 195 05/29/2019 0740   MCV 75.3 (L) 05/29/2019 0740   MCH 23.7 (L) 05/29/2019 0740   MCHC 31.4 05/29/2019 0740   RDW 15.6 (H) 05/29/2019 0740   LYMPHSABS 1.1 05/29/2019 0740   MONOABS 1.1 (H) 05/29/2019 0740    EOSABS 0.0 05/29/2019 0740   BASOSABS 0.0 05/29/2019 0740    Lab Results  Component Value Date   HGBA1C 7.0 06/29/2019    Assessment & Plan:   1. Type 2 diabetes mellitus with other specified complication, with long-term current use of insulin (HCC) Controlled with A1c of 7.0 Continue current management Counseled on Diabetic diet, my plate method, 638 minutes of moderate intensity exercise/week Keep blood sugar logs with fasting goals of 80-120 mg/dl, random of less than 180 and in the event of sugars less than 60 mg/dl or greater than 400 mg/dl please notify the clinic ASAP. It is recommended that you undergo annual eye exams and annual foot exams. Pneumonia vaccine is recommended. - POCT glucose (manual entry) - POCT glycosylated hemoglobin (Hb A1C) - atorvastatin (LIPITOR) 40 MG tablet; TAKE 1 TABLET BY MOUTH DAILY AT 6 PM.  Dispense: 90 tablet; Refill: 0 - metFORMIN (GLUCOPHAGE) 500 MG tablet; Take 2 tablets (1,000 mg total) by mouth 2 (two) times daily with a meal.  Dispense: 360 tablet; Refill: 1 - liraglutide (VICTOZA) 18 MG/3ML SOPN; Inject 0.2 mLs (1.2 mg total) into the skin daily with breakfast.  Dispense: 9 mL; Refill: 1  2. Pedal edema Uncontrolled Increased dose of Lasix - furosemide (LASIX) 40 MG tablet; Take 1 tablet (40 mg total) by mouth daily. 90day supply  Dispense: 30 tablet; Refill: 3  3. Acute gout of left hand, unspecified cause Currently on colchicine He is currently on Lasix which can exacerbate his symptoms - predniSONE (DELTASONE) 20 MG tablet; Take 1 tablet (20 mg total) by mouth 2 (two) times daily with a meal.  Dispense: 10 tablet; Refill: 0  4. Essential hypertension Slightly elevated No regimen changed today Counseled on blood pressure goal of less than 130/80, low-sodium, DASH diet, medication compliance,  150 minutes of moderate intensity exercise per week. Discussed medication compliance, adverse effects. - lisinopril-hydrochlorothiazide  (ZESTORETIC) 20-12.5 MG tablet; Take 2 tablets by mouth daily.  Dispense: 180 tablet; Refill: 1 - metoprolol succinate (TOPROL-XL) 50 MG 24 hr tablet; Take 1 tablet (50 mg total) by mouth daily.  Dispense: 90 tablet; Refill: 0  5. Other diabetic neurological complication associated with type 2 diabetes mellitus (HCC) Stable - gabapentin (NEURONTIN) 300 MG capsule; Take 1 capsule (300 mg total) by mouth 2 (two) times daily.  Dispense: 180 capsule; Refill: 1  6. Other insomnia Stable - traZODone (DESYREL) 50 MG tablet; Take 1 tablet (50 mg total) by mouth at bedtime.  Dispense: 30 tablet; Refill: 3     Meds ordered this encounter  Medications  . furosemide (LASIX) 40 MG tablet    Sig: Take 1 tablet (40 mg total) by mouth daily. 90day supply    Dispense:  30 tablet    Refill:  3  . predniSONE (DELTASONE) 20 MG tablet    Sig: Take 1 tablet (20 mg total) by mouth 2 (two) times daily with a meal.    Dispense:  10 tablet    Refill:  0  . atorvastatin (LIPITOR) 40 MG tablet    Sig: TAKE 1 TABLET BY MOUTH DAILY AT 6 PM.    Dispense:  90 tablet    Refill:  0  . metFORMIN (GLUCOPHAGE) 500 MG tablet    Sig: Take 2 tablets (1,000 mg total) by mouth 2 (two) times daily with a meal.    Dispense:  360 tablet    Refill:  1  . liraglutide (VICTOZA) 18 MG/3ML SOPN    Sig: Inject 0.2 mLs (1.2 mg total) into the skin daily with breakfast.    Dispense:  9 mL    Refill:  1  . lisinopril-hydrochlorothiazide (ZESTORETIC) 20-12.5 MG tablet    Sig: Take 2 tablets by mouth daily.    Dispense:  180 tablet    Refill:  1  . gabapentin (NEURONTIN) 300 MG capsule    Sig: Take 1 capsule (300 mg total) by mouth 2 (two) times daily.    Dispense:  180 capsule    Refill:  1  . metoprolol succinate (TOPROL-XL) 50 MG 24 hr tablet    Sig: Take 1 tablet (50 mg total) by mouth daily.    Dispense:  90 tablet    Refill:  0  . traZODone (DESYREL) 50 MG tablet    Sig: Take 1 tablet (50 mg total) by mouth at  bedtime.    Dispense:  30 tablet    Refill:  3    Follow-up: Return in about 3 months (around 09/29/2019) for medical conditions.       Charlott Rakes, MD, FAAFP. [facility] [place], [place]   06/29/2019, 10:18 AM

## 2019-07-30 ENCOUNTER — Other Ambulatory Visit: Payer: Self-pay | Admitting: Family Medicine

## 2019-07-30 DIAGNOSIS — M1A0490 Idiopathic chronic gout, unspecified hand, without tophus (tophi): Secondary | ICD-10-CM

## 2019-08-11 ENCOUNTER — Other Ambulatory Visit: Payer: Self-pay | Admitting: Family Medicine

## 2019-08-11 DIAGNOSIS — E1169 Type 2 diabetes mellitus with other specified complication: Secondary | ICD-10-CM

## 2019-08-11 DIAGNOSIS — Z794 Long term (current) use of insulin: Secondary | ICD-10-CM

## 2019-08-11 MED FILL — ACCU-CHEK GUIDE TEST STRIP: 30 days supply | Qty: 100 | Fill #0

## 2019-08-12 ENCOUNTER — Other Ambulatory Visit: Payer: Self-pay | Admitting: Family Medicine

## 2019-08-12 DIAGNOSIS — E1169 Type 2 diabetes mellitus with other specified complication: Secondary | ICD-10-CM

## 2019-08-12 DIAGNOSIS — I10 Essential (primary) hypertension: Secondary | ICD-10-CM

## 2019-08-12 DIAGNOSIS — E1149 Type 2 diabetes mellitus with other diabetic neurological complication: Secondary | ICD-10-CM

## 2019-09-24 ENCOUNTER — Other Ambulatory Visit: Payer: Self-pay | Admitting: Family Medicine

## 2019-09-24 DIAGNOSIS — E1169 Type 2 diabetes mellitus with other specified complication: Secondary | ICD-10-CM

## 2019-09-29 ENCOUNTER — Encounter: Payer: Self-pay | Admitting: Family Medicine

## 2019-09-29 ENCOUNTER — Other Ambulatory Visit: Payer: Self-pay

## 2019-09-29 ENCOUNTER — Ambulatory Visit: Payer: Medicare HMO | Attending: Family Medicine | Admitting: Family Medicine

## 2019-09-29 VITALS — BP 98/64 | HR 82 | Temp 98.0°F | Ht 63.0 in | Wt 265.0 lb

## 2019-09-29 DIAGNOSIS — I10 Essential (primary) hypertension: Secondary | ICD-10-CM | POA: Diagnosis not present

## 2019-09-29 DIAGNOSIS — E785 Hyperlipidemia, unspecified: Secondary | ICD-10-CM

## 2019-09-29 DIAGNOSIS — G8929 Other chronic pain: Secondary | ICD-10-CM | POA: Diagnosis not present

## 2019-09-29 DIAGNOSIS — E1149 Type 2 diabetes mellitus with other diabetic neurological complication: Secondary | ICD-10-CM | POA: Diagnosis not present

## 2019-09-29 DIAGNOSIS — D6489 Other specified anemias: Secondary | ICD-10-CM | POA: Diagnosis not present

## 2019-09-29 DIAGNOSIS — R600 Localized edema: Secondary | ICD-10-CM

## 2019-09-29 DIAGNOSIS — Z1211 Encounter for screening for malignant neoplasm of colon: Secondary | ICD-10-CM

## 2019-09-29 DIAGNOSIS — L603 Nail dystrophy: Secondary | ICD-10-CM | POA: Diagnosis not present

## 2019-09-29 DIAGNOSIS — M25511 Pain in right shoulder: Secondary | ICD-10-CM | POA: Diagnosis not present

## 2019-09-29 DIAGNOSIS — E1169 Type 2 diabetes mellitus with other specified complication: Secondary | ICD-10-CM

## 2019-09-29 DIAGNOSIS — Z794 Long term (current) use of insulin: Secondary | ICD-10-CM | POA: Diagnosis not present

## 2019-09-29 LAB — POCT GLYCOSYLATED HEMOGLOBIN (HGB A1C): HbA1c, POC (controlled diabetic range): 6.1 % (ref 0.0–7.0)

## 2019-09-29 LAB — GLUCOSE, POCT (MANUAL RESULT ENTRY): POC Glucose: 57 mg/dl — AB (ref 70–99)

## 2019-09-29 MED ORDER — LISINOPRIL-HYDROCHLOROTHIAZIDE 20-12.5 MG PO TABS: 1.0000 | ORAL_TABLET | Freq: Every day | ORAL | 1 refills | Status: DC

## 2019-09-29 MED ORDER — DICLOFENAC SODIUM 1 % TD GEL: 2.0000 g | Freq: Four times a day (QID) | TRANSDERMAL | 3 refills | Status: DC

## 2019-09-29 MED ORDER — INSULIN ASPART PROT & ASPART (70-30 MIX) 100 UNIT/ML ~~LOC~~ SUSP: 48.0000 [IU] | Freq: Two times a day (BID) | SUBCUTANEOUS | 1 refills | Status: DC

## 2019-09-29 MED FILL — DICLOFENAC SODIUM 1% GEL: 1 | 17 days supply | Qty: 100 | Fill #0

## 2019-09-29 NOTE — Patient Instructions (Signed)
-  Reduce Lantus to 48 units twice daily in the morning and evening. -Reduce lisinopril/HCTZ 20/12.5 mg to 1 tablet daily.

## 2019-09-29 NOTE — Progress Notes (Signed)
Established Patient Office Visit  Subjective:  Patient ID: [name], male    DOB: July 27, 1961  Age: 58 y.o. MRN: [id number]  CC:  Chief Complaint  Patient presents with  . Diabetes    HPI [name] is a 58 year old male with a history of hypertension, type 2 diabetes mellitus (A1c 6.1), gout, osteoarthritis of the knee (status post bilateral knee arthroscopy in the past), pedal edema and anemia who presents today for a follow up of his chronic conditions.  His blood pressure today is 98/64 and he has not yet taken his antihypertensives this morning.  He states that he occasionally checks it at home and the numbers are usually around 120/80.  Endorses occasional dizziness.  His A1c is 6.1 today, down from 7.0 at his last visit.  His blood sugar log revealed that his lowest blood sugar is 74.  He follows a diabetic diet and avoids sugary foods and carbohydrates.  His last eye exam was more than a year ago.  He is due for a foot exam today.  Denies neuropathy.  Endorses compliance with medication.  Gout is stable with no flairs since July.  His pedal edema is improved with medication and the patient endorses use of compression stockings.  His osteoarthritis of the knees is stable with no pain at this time but he is complaining of right shoulder pain that he believes is arthritis.  The pain started two months ago and is described as intermittent throbbing 8/10.  He has not taken anything for the pain and it tends to be worse with inactivity.   Past Medical History:  Diagnosis Date  . Gouty bursitis, elbow  10/18/2015  . Hx of gout   . Hyperlipidemia   . Hypertension   . Knee pain, chronic   . Type II diabetes mellitus ([place])     Past Surgical History:  Procedure Laterality Date  . KNEE ARTHROSCOPY Bilateral 12/29/2015   Procedure: ARTHROSCOPY KNEE BILATERAL WITH  PARTIAL MEDIAL AND LATERAL MENISCECTOMY AND FOUR COMPARTMENT SYNOVECTOMY , CHONDRALPLASTIES, PATELLA-FEMORAL  CHONDRALPLASTIES BILATERALLY;  Surgeon: Dorna Leitz, MD;  Location: [facility];  Service: Orthopedics;  Laterality: Bilateral;  . NO PAST SURGERIES      Family History  Problem Relation Age of Onset  . Diabetes Mother   . Diabetes Father     Social History   Socioeconomic History  . Marital status: Single    Spouse name: Not on file  . Number of children: Not on file  . Years of education: Not on file  . Highest education level: Not on file  Occupational History  . Not on file  Social Needs  . Financial resource strain: Not on file  . Food insecurity    Worry: Not on file    Inability: Not on file  . Transportation needs    Medical: Not on file    Non-medical: Not on file  Tobacco Use  . Smoking status: Never Smoker  . Smokeless tobacco: Never Used  Substance and Sexual Activity  . Alcohol use: No  . Drug use: No  . Sexual activity: Not Currently  Lifestyle  . Physical activity    Days per week: Not on file    Minutes per session: Not on file  . Stress: Not on file  Relationships  . Social Herbalist on phone: Not on file    Gets together: Not on file    Attends religious service: Not on file  Active member of club or organization: Not on file    Attends meetings of clubs or organizations: Not on file    Relationship status: Not on file  . Intimate partner violence    Fear of current or ex partner: Not on file    Emotionally abused: Not on file    Physically abused: Not on file    Forced sexual activity: Not on file  Other Topics Concern  . Not on file  Social History Narrative  . Not on file    Outpatient Medications Prior to Visit  Medication Sig Dispense Refill  . ACCU-CHEK FASTCLIX LANCETS MISC Use as directed three times daily 100 each 12  . ACCU-CHEK GUIDE test strip USE AS INSTRUCTED THREE TIMES DAILY 100 strip 12  . aspirin 81 MG EC tablet Take 1 tablet (81 mg total) by mouth daily. 90 tablet 1  . atorvastatin (LIPITOR) 40 MG tablet TAKE  1 TABLET BY MOUTH DAILY AT 6 PM. 90 tablet 0  . benzonatate (TESSALON) 100 MG capsule Take 200 mg by mouth 2 (two) times daily as needed for cough.    . Blood Glucose Monitoring Suppl (ACCU-CHEK GUIDE) w/Device KIT 1 each by Does not apply route 3 (three) times daily. 1 kit 0  . cetirizine (ZYRTEC) 10 MG tablet TAKE 1 TABLET (10 MG TOTAL) BY MOUTH DAILY. (Patient not taking: Reported on 06/29/2019) 30 tablet 3  . COLCRYS 0.6 MG tablet TAKE 1 TABLET EVERY DAY 90 tablet 0  . FEROSUL 325 (65 Fe) MG tablet TAKE 1 TABLET BY MOUTH DAILY WITH BREAKFAST. 30 tablet 2  . furosemide (LASIX) 40 MG tablet Take 1 tablet (40 mg total) by mouth daily. 90day supply 30 tablet 3  . gabapentin (NEURONTIN) 300 MG capsule TAKE 1 CAPSULE TWICE DAILY 180 capsule 1  . HYDROcodone-acetaminophen (NORCO/VICODIN) 5-325 MG tablet Take 1 tablet by mouth every 6 (six) hours as needed for severe pain. (Patient not taking: Reported on 06/29/2019) 5 tablet 0  . Insulin Pen Needle (TRUEPLUS PEN NEEDLES) 32G X 4 MM MISC Use as directed daily to administer victoza 100 each 12  . Insulin Syringe-Needle U-100 (TRUEPLUS INSULIN SYRINGE) 30G X 5/16" 1 ML MISC USE TWICE DAILY 100 each 12  . meloxicam (MOBIC) 7.5 MG tablet Take 1 tablet (7.5 mg total) by mouth 2 (two) times daily as needed for pain. (Patient not taking: Reported on 06/29/2019) 60 tablet 2  . metFORMIN (GLUCOPHAGE) 500 MG tablet Take 2 tablets (1,000 mg total) by mouth 2 (two) times daily with a meal. 360 tablet 1  . metoprolol succinate (TOPROL-XL) 50 MG 24 hr tablet TAKE 1 TABLET (50 MG TOTAL) BY MOUTH DAILY. 90 tablet 0  . predniSONE (DELTASONE) 20 MG tablet Take 1 tablet (20 mg total) by mouth 2 (two) times daily with a meal. 10 tablet 0  . traZODone (DESYREL) 50 MG tablet Take 1 tablet (50 mg total) by mouth at bedtime. 30 tablet 3  . VICTOZA 18 MG/3ML SOPN INJECT 1.2MG SUBCUTANEOUSLY EVERY DAY WITH BREAKFAST 18 mL 0  . diclofenac sodium (VOLTAREN) 1 % GEL Apply 2 g  topically 4 (four) times daily. (Patient not taking: Reported on 06/29/2019) 1 Tube 5  . lisinopril-hydrochlorothiazide (ZESTORETIC) 20-12.5 MG tablet Take 2 tablets by mouth daily. 180 tablet 1  . NOVOLOG MIX 70/30 (70-30) 100 UNIT/ML injection INJECT 50 UNITS INTO THE SKIN TWO TIMES DAILY WITH A MEAL. 90 mL 0   No facility-administered medications prior to visit.  No Known Allergies  ROS Review of Systems  Constitutional: Negative for fatigue, fever and unexpected weight change.  HENT: Negative for congestion, rhinorrhea, sinus pressure and sinus pain.   Eyes: Negative for visual disturbance.  Respiratory: Negative for cough, chest tightness and shortness of breath.   Cardiovascular: Positive for leg swelling. Negative for chest pain and palpitations.       BLE  Gastrointestinal: Negative for abdominal distention, abdominal pain, constipation, diarrhea, nausea and vomiting.  Endocrine: Negative for polydipsia and polyuria.  Genitourinary: Negative for decreased urine volume, difficulty urinating and dysuria.  Musculoskeletal: Positive for arthralgias. Negative for myalgias.       Right shoulder  Skin: Negative for color change and rash.  Neurological: Positive for dizziness. Negative for tremors, weakness and numbness.       Occasionally  Hematological: Does not bruise/bleed easily.  Psychiatric/Behavioral: Negative for agitation and behavioral problems.      Objective:    Physical Exam  Constitutional: He is oriented to person, place, and time. He appears well-developed and well-nourished. No distress.  HENT:  Head: Normocephalic and atraumatic.  Eyes: Pupils are equal, round, and reactive to light. Conjunctivae and EOM are normal.  Neck: Normal range of motion. Neck supple. No JVD present.  Cardiovascular: Normal rate, regular rhythm, normal heart sounds and intact distal pulses.  No murmur heard. Pulmonary/Chest: Effort normal and breath sounds normal. No respiratory  distress.  Abdominal: Soft. Bowel sounds are normal. He exhibits no distension. There is no abdominal tenderness.  Musculoskeletal: Normal range of motion.     Right shoulder: He exhibits tenderness. He exhibits no crepitus and normal strength.     Right lower leg: Edema present.     Left lower leg: Edema present.     Comments: +2 edema in bilateral lower extremities  Neurological: He is alert and oriented to person, place, and time.  Skin: Skin is warm and dry. No rash noted.       BP 98/64   Pulse 82   Temp 98 F (36.7 C) (Oral)   Ht '5\' 3"'  (1.6 m)   Wt 265 lb (120.2 kg)   SpO2 96%   BMI 46.94 kg/m    Health Maintenance Due  Topic Date Due  . OPHTHALMOLOGY EXAM  10/03/1969  . COLONOSCOPY  10/03/2009  . INFLUENZA VACCINE  06/19/2019  . FOOT EXAM  09/24/2019    There are no preventive care reminders to display for this patient.  Lab Results  Component Value Date   TSH 0.994 12/28/2015   Lab Results  Component Value Date   WBC 9.3 05/29/2019   HGB 11.4 (L) 05/29/2019   HCT 36.3 (L) 05/29/2019   MCV 75.3 (L) 05/29/2019   PLT 195 05/29/2019   Lab Results  Component Value Date   NA 144 06/14/2019   K 4.5 06/14/2019   CO2 26 06/14/2019   GLUCOSE 61 (L) 06/14/2019   BUN 26 (H) 06/14/2019   CREATININE 1.57 (H) 06/14/2019   BILITOT 0.4 03/30/2019   ALKPHOS 90 03/30/2019   AST 28 03/30/2019   ALT 31 03/30/2019   PROT 7.8 03/30/2019   ALBUMIN 4.7 03/30/2019   CALCIUM 9.8 06/14/2019   ANIONGAP 16 (H) 05/29/2019   Lab Results  Component Value Date   CHOL 111 08/12/2017   Lab Results  Component Value Date   HDL 30 (L) 08/12/2017   Lab Results  Component Value Date   LDLCALC 51 08/12/2017   Lab Results  Component Value Date  TRIG 152 (H) 08/12/2017   Lab Results  Component Value Date   CHOLHDL 3.7 08/12/2017   Lab Results  Component Value Date   HGBA1C 6.1 09/29/2019      Assessment & Plan:   1. Other diabetic neurological complication  associated with type 2 diabetes mellitus ([place]) Controlled with A1c of 6.1 Concern for hypoglycemia Reduce Novolog 70/30 dose from 50 units BID to 48 units BID Counseled on Diabetic diet, my plate method, 381 minutes of moderate intensity exercise/week Keep blood sugar logs with fasting goals of 80-120 mg/dl, random of less than 180 and in the event of sugars less than 60 mg/dl or greater than 400 mg/dl please notify the clinic ASAP. It is recommended that you undergo annual eye exams. Foot exam performed today.  Referral to podiatry. Urine microalbumin due 03/2020 - Glucose (CBG) - HgB M4C - Basic Metabolic Panel  2. Type 2 diabetes mellitus with other specified complication, with long-term current use of insulin (HCC) See #1 - insulin aspart protamine- aspart (NOVOLOG MIX 70/30) (70-30) 100 UNIT/ML injection; Inject 0.48 mLs (48 Units total) into the skin 2 (two) times daily with a meal.  Dispense: 90 mL; Refill: 1  3. Essential hypertension Controlled Concern for hypotension based off BP reading today and endorsement of occasional dizziness. Reduce lisinopril-hydrochlorothiazide 20-12.5 mg from BID to once daily. Asked to check home blood pressure after medication change and call with that measurement. Counseled on blood pressure goal of less than 130/80, low-sodium, DASH diet, medication compliance, 150 minutes of moderate intensity exercise per week. Discussed medication compliance, adverse effects. - lisinopril-hydrochlorothiazide (ZESTORETIC) 20-12.5 MG tablet; Take 1 tablet by mouth daily.  Dispense: 90 tablet; Refill: 1  4. Dystrophic nail Bilateral feet - Ambulatory referral to Podiatry  5. Anemia due to other cause, not classified Continue Ferosul - CBC with Differential/Platelet  6. Screening for colon cancer Declined colonoscopy and ask to do FIT test - Fecal occult blood, imunochemical([facility])  7. Dyslipidemia associated with type 2 diabetes mellitus (HCC)  Controlled Check lipid level today Continue atorvastatin Eat a low cholesterol diet  8. Chronic right shoulder pain Likely due to ostearthritis Begin Voltaren Gel as needed for symptom relieff - diclofenac sodium (VOLTAREN) 1 % GEL; Apply 2 g topically 4 (four) times daily.  Dispense: 100 g; Refill: 3  9. Pedal edema Improved Continue Lasix Encouraged continued use of compression stockings   Meds ordered this encounter  Medications  . diclofenac sodium (VOLTAREN) 1 % GEL    Sig: Apply 2 g topically 4 (four) times daily.    Dispense:  100 g    Refill:  3  . insulin aspart protamine- aspart (NOVOLOG MIX 70/30) (70-30) 100 UNIT/ML injection    Sig: Inject 0.48 mLs (48 Units total) into the skin 2 (two) times daily with a meal.    Dispense:  90 mL    Refill:  1    Dose change  . lisinopril-hydrochlorothiazide (ZESTORETIC) 20-12.5 MG tablet    Sig: Take 1 tablet by mouth daily.    Dispense:  90 tablet    Refill:  1    Follow-up: Return in about 3 months (around 12/30/2019) for Chronic medical conditions.    Tomasita Morrow, RN

## 2019-09-30 ENCOUNTER — Other Ambulatory Visit: Payer: Self-pay | Admitting: Family Medicine

## 2019-09-30 DIAGNOSIS — E1169 Type 2 diabetes mellitus with other specified complication: Secondary | ICD-10-CM

## 2019-09-30 DIAGNOSIS — Z794 Long term (current) use of insulin: Secondary | ICD-10-CM

## 2019-09-30 LAB — CBC WITH DIFFERENTIAL/PLATELET
Basophils Absolute: 0 10*3/uL (ref 0.0–0.2)
Basos: 1 %
EOS (ABSOLUTE): 0.1 10*3/uL (ref 0.0–0.4)
Eos: 2 %
Hematocrit: 39.8 % (ref 37.5–51.0)
Hemoglobin: 11.9 g/dL — ABNORMAL LOW (ref 13.0–17.7)
Immature Grans (Abs): 0 10*3/uL (ref 0.0–0.1)
Immature Granulocytes: 0 %
Lymphocytes Absolute: 2.7 10*3/uL (ref 0.7–3.1)
Lymphs: 43 %
MCH: 23.7 pg — ABNORMAL LOW (ref 26.6–33.0)
MCHC: 29.9 g/dL — ABNORMAL LOW (ref 31.5–35.7)
MCV: 79 fL (ref 79–97)
Monocytes Absolute: 0.6 10*3/uL (ref 0.1–0.9)
Monocytes: 9 %
Neutrophils Absolute: 2.9 10*3/uL (ref 1.4–7.0)
Neutrophils: 45 %
Platelets: 179 10*3/uL (ref 150–450)
RBC: 5.02 x10E6/uL (ref 4.14–5.80)
RDW: 16 % — ABNORMAL HIGH (ref 11.6–15.4)
WBC: 6.3 10*3/uL (ref 3.4–10.8)

## 2019-09-30 LAB — BASIC METABOLIC PANEL
BUN/Creatinine Ratio: 23 — ABNORMAL HIGH (ref 9–20)
BUN: 46 mg/dL — ABNORMAL HIGH (ref 6–24)
CO2: 21 mmol/L (ref 20–29)
Calcium: 9.5 mg/dL (ref 8.7–10.2)
Chloride: 101 mmol/L (ref 96–106)
Creatinine, Ser: 2.02 mg/dL — ABNORMAL HIGH (ref 0.76–1.27)
GFR calc Af Amer: 41 mL/min/{1.73_m2} — ABNORMAL LOW (ref >59)
GFR calc non Af Amer: 35 mL/min/{1.73_m2} — ABNORMAL LOW (ref >59)
Glucose: 45 mg/dL — ABNORMAL LOW (ref 65–99)
Potassium: 3.9 mmol/L (ref 3.5–5.2)
Sodium: 141 mmol/L (ref 134–144)

## 2019-10-01 ENCOUNTER — Other Ambulatory Visit: Payer: Self-pay

## 2019-10-01 DIAGNOSIS — E1169 Type 2 diabetes mellitus with other specified complication: Secondary | ICD-10-CM

## 2019-10-01 DIAGNOSIS — Z794 Long term (current) use of insulin: Secondary | ICD-10-CM

## 2019-10-01 NOTE — Progress Notes (Signed)
Lab tech has been informed to add lipid panel.

## 2019-10-08 LAB — SPECIMEN STATUS REPORT

## 2019-10-11 ENCOUNTER — Other Ambulatory Visit: Payer: Self-pay | Admitting: Family Medicine

## 2019-10-11 DIAGNOSIS — E1169 Type 2 diabetes mellitus with other specified complication: Secondary | ICD-10-CM

## 2019-10-11 DIAGNOSIS — Z794 Long term (current) use of insulin: Secondary | ICD-10-CM

## 2019-10-11 MED FILL — ACCU-CHEK FASTCLIX LANCETS: 34 days supply | Qty: 102 | Fill #0

## 2019-10-19 ENCOUNTER — Encounter: Payer: Self-pay | Admitting: Family Medicine

## 2019-10-19 ENCOUNTER — Ambulatory Visit: Payer: Medicare HMO | Attending: Family Medicine | Admitting: Family Medicine

## 2019-10-19 ENCOUNTER — Other Ambulatory Visit: Payer: Self-pay

## 2019-10-19 DIAGNOSIS — M10041 Idiopathic gout, right hand: Secondary | ICD-10-CM | POA: Diagnosis not present

## 2019-10-19 MED ORDER — PREDNISONE 20 MG PO TABS: 20.0000 mg | ORAL_TABLET | Freq: Two times a day (BID) | ORAL | 0 refills | Status: DC

## 2019-10-19 MED ORDER — COLCHICINE 0.6 MG PO TABS: 0.6000 mg | ORAL_TABLET | Freq: Every day | ORAL | 1 refills | Status: DC

## 2019-10-19 MED FILL — predniSONE 20 MG TABS: 20 | 5 days supply | Qty: 10 | Fill #0

## 2019-10-19 MED FILL — COLCRYS 0.6 MG TABLET: 0.6 | 90 days supply | Qty: 90 | Fill #0

## 2019-10-19 NOTE — Progress Notes (Signed)
Patient has been called and DOB has been verified. Patient has been screened and transferred to PCP to start phone visit.    Patient is having a gout flare up in his right hand.

## 2019-10-19 NOTE — Progress Notes (Signed)
Virtual Visit via Telephone Note  I connected with [name], on 10/19/2019 at 1:40 PM by telephone due to the COVID-19 pandemic and verified that I am speaking with the correct person using two identifiers.   Consent: I discussed the limitations, risks, security and privacy concerns of performing an evaluation and management service by telephone and the availability of in person appointments. I also discussed with the patient that there may be a patient responsible charge related to this service. The patient expressed understanding and agreed to proceed.   Location of Patient: Home  Location of Provider: Clinic   Persons participating in Telemedicine visit: Dakari Cregger Farrington-CMA Dr. Margarita Rana     History of Present Illness: [name] is a 58 year old male with a history of type 2 diabetes mellitus(A1c 7.0), hypertension, hyperlipidemia, gout, osteoarthritis of the knee (status post bilateral knee arthroscopy in the past) who presents today for an acute visit.  He complains of Gout flare in his right hand after eating some ham for thanksgiving and this prevents him from dressing up. He is unable to make a fist. States he has been on colchicine but he is running out. He has not had a Gout flare in a while.  Past Medical History:  Diagnosis Date  . Gouty bursitis, elbow  10/18/2015  . Hx of gout   . Hyperlipidemia   . Hypertension   . Knee pain, chronic   . Type II diabetes mellitus (HCC)    No Known Allergies  Current Outpatient Medications on File Prior to Visit  Medication Sig Dispense Refill  . Accu-Chek FastClix Lancets MISC USE AS DIRECTED THREE TIMES DAILY 102 each 12  . ACCU-CHEK GUIDE test strip USE AS INSTRUCTED THREE TIMES DAILY 100 strip 12  . aspirin 81 MG EC tablet Take 1 tablet (81 mg total) by mouth daily. 90 tablet 1  . atorvastatin (LIPITOR) 40 MG tablet TAKE 1 TABLET BY MOUTH DAILY AT 6 PM. 90 tablet 0  . benzonatate (TESSALON) 100  MG capsule Take 200 mg by mouth 2 (two) times daily as needed for cough.    . Blood Glucose Monitoring Suppl (ACCU-CHEK GUIDE) w/Device KIT 1 each by Does not apply route 3 (three) times daily. 1 kit 0  . COLCRYS 0.6 MG tablet TAKE 1 TABLET EVERY DAY 90 tablet 0  . diclofenac sodium (VOLTAREN) 1 % GEL Apply 2 g topically 4 (four) times daily. 100 g 3  . FEROSUL 325 (65 Fe) MG tablet TAKE 1 TABLET BY MOUTH DAILY WITH BREAKFAST. 30 tablet 2  . furosemide (LASIX) 40 MG tablet Take 1 tablet (40 mg total) by mouth daily. 90day supply 30 tablet 3  . gabapentin (NEURONTIN) 300 MG capsule TAKE 1 CAPSULE TWICE DAILY 180 capsule 1  . insulin aspart protamine- aspart (NOVOLOG MIX 70/30) (70-30) 100 UNIT/ML injection Inject 0.48 mLs (48 Units total) into the skin 2 (two) times daily with a meal. 90 mL 1  . Insulin Pen Needle (TRUEPLUS PEN NEEDLES) 32G X 4 MM MISC Use as directed daily to administer victoza 100 each 12  . Insulin Syringe-Needle U-100 (TRUEPLUS INSULIN SYRINGE) 30G X 5/16" 1 ML MISC USE TWICE DAILY 100 each 12  . lisinopril-hydrochlorothiazide (ZESTORETIC) 20-12.5 MG tablet Take 1 tablet by mouth daily. 90 tablet 1  . metFORMIN (GLUCOPHAGE) 500 MG tablet Take 2 tablets (1,000 mg total) by mouth 2 (two) times daily with a meal. 360 tablet 1  . metoprolol succinate (TOPROL-XL) 50 MG  24 hr tablet TAKE 1 TABLET (50 MG TOTAL) BY MOUTH DAILY. 90 tablet 0  . predniSONE (DELTASONE) 20 MG tablet Take 1 tablet (20 mg total) by mouth 2 (two) times daily with a meal. 10 tablet 0  . traZODone (DESYREL) 50 MG tablet Take 1 tablet (50 mg total) by mouth at bedtime. 30 tablet 3  . VICTOZA 18 MG/3ML SOPN INJECT 1.2MG SUBCUTANEOUSLY EVERY DAY WITH BREAKFAST 18 mL 0  . cetirizine (ZYRTEC) 10 MG tablet TAKE 1 TABLET (10 MG TOTAL) BY MOUTH DAILY. (Patient not taking: Reported on 06/29/2019) 30 tablet 3  . HYDROcodone-acetaminophen (NORCO/VICODIN) 5-325 MG tablet Take 1 tablet by mouth every 6 (six) hours as needed for  severe pain. (Patient not taking: Reported on 06/29/2019) 5 tablet 0  . meloxicam (MOBIC) 7.5 MG tablet Take 1 tablet (7.5 mg total) by mouth 2 (two) times daily as needed for pain. (Patient not taking: Reported on 06/29/2019) 60 tablet 2   No current facility-administered medications on file prior to visit.     Observations/Objective: Alert, awake, oriented x3 Nurse no acute distress  Assessment and Plan: 1. Acute idiopathic gout of right hand Discussed low purine eating plan - colchicine (COLCRYS) 0.6 MG tablet; Take 1 tablet (0.6 mg total) by mouth daily.  Dispense: 90 tablet; Refill: 1 - predniSONE (DELTASONE) 20 MG tablet; Take 1 tablet (20 mg total) by mouth 2 (two) times daily with a meal.  Dispense: 10 tablet; Refill: 0   Follow Up Instructions: Keep previously scheduled appointment   I discussed the assessment and treatment plan with the patient. The patient was provided an opportunity to ask questions and all were answered. The patient agreed with the plan and demonstrated an understanding of the instructions.   The patient was advised to call back or seek an in-person evaluation if the symptoms worsen or if the condition fails to improve as anticipated.     I provided 11 minutes total of non-face-to-face time during this encounter including median intraservice time, reviewing previous notes, labs, imaging, medications, management and patient verbalized understanding.     Charlott Rakes, MD, FAAFP. [facility] [place], [place]   10/19/2019, 1:40 PM

## 2019-11-03 ENCOUNTER — Ambulatory Visit: Payer: Medicare HMO | Attending: Family Medicine | Admitting: Physician Assistant

## 2019-11-03 ENCOUNTER — Other Ambulatory Visit: Payer: Self-pay

## 2019-11-03 DIAGNOSIS — M10041 Idiopathic gout, right hand: Secondary | ICD-10-CM | POA: Diagnosis not present

## 2019-11-03 DIAGNOSIS — E1169 Type 2 diabetes mellitus with other specified complication: Secondary | ICD-10-CM | POA: Diagnosis not present

## 2019-11-03 DIAGNOSIS — Z794 Long term (current) use of insulin: Secondary | ICD-10-CM | POA: Diagnosis not present

## 2019-11-03 MED ORDER — PREDNISONE 10 MG PO TABS: ORAL_TABLET | ORAL | 0 refills | Status: DC

## 2019-11-03 MED FILL — predniSONE 10 MG TABS: 10 | 6 days supply | Qty: 21 | Fill #0

## 2019-11-03 NOTE — Progress Notes (Signed)
Pt states his blood sugar this morning was 100

## 2019-11-03 NOTE — Progress Notes (Signed)
Virtual Visit via Telephone Note  I connected with [name] on 11/03/19 at  1:50 PM EST by telephone and verified that I am speaking with the correct person using two identifiers.   I discussed the limitations, risks, security and privacy concerns of performing an evaluation and management service by telephone and the availability of in person appointments. I also discussed with the patient that there may be a patient responsible charge related to this service. The patient expressed understanding and agreed to proceed.   History of Present Illness:   [name] is still having trouble with hand pain.  Blood sugars running 100-120.  Prednisone was helping but he only took it for 5 days.  Swelling still there.  No fever.      Observations/Objective:  NAD.  A&O X3   Assessment and Plan:   1. Acute idiopathic gout of right hand - predniSONE (DELTASONE) 10 MG tablet; 6,5,4,3,2,1 take each days dose all at once with food  Dispense: 21 tablet; Refill: 0  2. Type 2 diabetes mellitus with other specified complication, with long-term current use of insulin (HCC) Blood sugars running bt 100-120 even on prednisone.      Follow Up Instructions: With PCP as scheduled   I discussed the assessment and treatment plan with the patient. The patient was provided an opportunity to ask questions and all were answered. The patient agreed with the plan and demonstrated an understanding of the instructions.   The patient was advised to call back or seek an in-person evaluation if the symptoms worsen or if the condition fails to improve as anticipated.  I provided 6 minutes of non-face-to-face time during this encounter.   Freeman Caldron, PA-C  Patient ID: [name], male   DOB: 1961-03-11, 58 y.o.   MRN: [id number]

## 2019-12-06 ENCOUNTER — Other Ambulatory Visit: Payer: Self-pay | Admitting: Pharmacist

## 2019-12-06 DIAGNOSIS — E1169 Type 2 diabetes mellitus with other specified complication: Secondary | ICD-10-CM

## 2019-12-06 DIAGNOSIS — R600 Localized edema: Secondary | ICD-10-CM

## 2019-12-06 DIAGNOSIS — Z794 Long term (current) use of insulin: Secondary | ICD-10-CM

## 2019-12-06 DIAGNOSIS — I10 Essential (primary) hypertension: Secondary | ICD-10-CM

## 2019-12-06 MED ORDER — INSULIN ASPART PROT & ASPART (70-30 MIX) 100 UNIT/ML ~~LOC~~ SUSP: 48.0000 [IU] | Freq: Two times a day (BID) | SUBCUTANEOUS | 0 refills | Status: DC

## 2019-12-06 MED ORDER — ATORVASTATIN CALCIUM 40 MG PO TABS: 40.0000 mg | ORAL_TABLET | Freq: Every day | ORAL | 0 refills | Status: DC

## 2019-12-06 MED ORDER — FUROSEMIDE 40 MG PO TABS: 40.0000 mg | ORAL_TABLET | Freq: Every day | ORAL | 0 refills | Status: DC

## 2019-12-06 MED ORDER — LISINOPRIL-HYDROCHLOROTHIAZIDE 20-12.5 MG PO TABS: 1.0000 | ORAL_TABLET | Freq: Every day | ORAL | 0 refills | Status: DC

## 2019-12-06 MED ORDER — VICTOZA 18 MG/3ML ~~LOC~~ SOPN: PEN_INJECTOR | SUBCUTANEOUS | 0 refills | Status: DC

## 2019-12-17 ENCOUNTER — Ambulatory Visit: Payer: Medicare HMO | Admitting: Podiatry

## 2019-12-17 ENCOUNTER — Encounter: Payer: Self-pay | Admitting: Podiatry

## 2019-12-17 ENCOUNTER — Other Ambulatory Visit: Payer: Self-pay

## 2019-12-17 DIAGNOSIS — B351 Tinea unguium: Secondary | ICD-10-CM | POA: Insufficient documentation

## 2019-12-17 DIAGNOSIS — M79674 Pain in right toe(s): Secondary | ICD-10-CM | POA: Diagnosis not present

## 2019-12-17 DIAGNOSIS — E1149 Type 2 diabetes mellitus with other diabetic neurological complication: Secondary | ICD-10-CM

## 2019-12-17 DIAGNOSIS — M79675 Pain in left toe(s): Secondary | ICD-10-CM

## 2019-12-17 NOTE — Progress Notes (Signed)
This patient presents to the office with chief complaint of long thick deformed  nails and diabetic feet.  This patient  says there  is   pain and discomfort in their feet.  This patient says there are long thick painful nails.  These nails are painful walking and wearing shoes.  Patient has no history of infection or drainage from both feet.  Patient is unable to  self treat his own nails   Patient was referred to [facility] by [facility] wellness clinic.. This patient presents  to the office today for treatment of the  long nails and a foot evaluation due to history of  Diabetes. Patient is diagnosed with diabetic neuropathy.  General Appearance  Alert, conversant and in no acute stress.  Vascular  Dorsalis pedis and posterior tibial  pulses are not  palpable  Bilaterally due to severe swelling  B/L.Marland Kitchen  Capillary return is within normal limits  bilaterally. Temperature is within normal limits  bilaterally.  Neurologic  Senn-Weinstein monofilament wire test within normal limits  bilaterally. Muscle power within normal limits bilaterally.  Nails Thick disfigured discolored nails with subungual debris  from hallux to fifth toes bilaterally. No evidence of bacterial infection or drainage bilaterally.  Orthopedic  No limitations of motion of motion feet .  No crepitus or effusions noted.  No bony pathology or digital deformities noted.  Skin  normotropic skin with no porokeratosis noted bilaterally.  No signs of infections or ulcers noted.     Onychomycosis  Diabetes with neuropathy.  IE  Debride nails x 10.  A diabetic foot exam was performed and there is  evidence of any vascular  pathology. LOPS  WNL  B/L  RTC 3 months.   Gardiner Barefoot DPM

## 2019-12-30 MED FILL — ACCU-CHEK FASTCLIX LANCETS: 34 days supply | Qty: 102 | Fill #1

## 2019-12-30 MED FILL — ACCU-CHEK GUIDE TEST STRIP: 30 days supply | Qty: 100 | Fill #1

## 2020-01-05 ENCOUNTER — Ambulatory Visit: Payer: Medicare HMO | Attending: Family Medicine | Admitting: Physician Assistant

## 2020-01-05 ENCOUNTER — Other Ambulatory Visit: Payer: Self-pay

## 2020-01-05 ENCOUNTER — Ambulatory Visit: Payer: Medicare HMO | Admitting: Family Medicine

## 2020-01-05 DIAGNOSIS — Z794 Long term (current) use of insulin: Secondary | ICD-10-CM

## 2020-01-05 DIAGNOSIS — E1169 Type 2 diabetes mellitus with other specified complication: Secondary | ICD-10-CM | POA: Diagnosis not present

## 2020-01-05 DIAGNOSIS — M25521 Pain in right elbow: Secondary | ICD-10-CM

## 2020-01-05 MED ORDER — MELOXICAM 7.5 MG PO TABS: 7.5000 mg | ORAL_TABLET | Freq: Two times a day (BID) | ORAL | 2 refills | Status: DC | PRN

## 2020-01-05 MED ORDER — ACCU-CHEK GUIDE VI STRP: ORAL_STRIP | 12 refills | Status: DC

## 2020-01-05 MED ORDER — ACCU-CHEK FASTCLIX LANCETS MISC: 1.0000 | Freq: Three times a day (TID) | 12 refills | Status: DC

## 2020-01-05 MED FILL — MELOXICAM 7.5 MG TABLET: 7.5 | 30 days supply | Qty: 60 | Fill #0

## 2020-01-05 NOTE — Progress Notes (Signed)
Patient ID: CARLEN KAHANE, male   DOB: 1961/11/18, 59 y.o.   MRN: [id number] Virtual Visit via Telephone Note  I connected with [name] on 01/05/20 at  9:50 AM EST by telephone and verified that I am speaking with the correct person using two identifiers.   I discussed the limitations, risks, security and privacy concerns of performing an evaluation and management service by telephone and the availability of in person appointments. I also discussed with the patient that there may be a patient responsible charge related to this service. The patient expressed understanding and agreed to proceed.  PATIENT visit by telephone virtually in the context of Covid-19 pandemic. Patient location:  home My Location:  [place] office Persons on the call: me and the patient  History of Present Illness:  Patient is complaining of R elbow pain now for several months.  He has had an injection in it previously.  No fever.  No swelling or redness.    Blood sugars running around 120.      Observations/Objective:  NAd.  A&Ox3   Assessment and Plan: 1. Right elbow pain - Ambulatory referral to Orthopedic Surgery - meloxicam (MOBIC) 7.5 MG tablet; Take 1 tablet (7.5 mg total) by mouth 2 (two) times daily as needed for pain.  Dispense: 60 tablet; Refill: 2  2. Type 2 diabetes mellitus with other specified complication, with long-term current use of insulin (HCC) Controlled-continue current regimen - Accu-Chek FastClix Lancets MISC; 1 each by Does not apply route 3 (three) times daily. as directed  Dispense: 102 each; Refill: 12 - glucose blood (ACCU-CHEK GUIDE) test strip; USE AS INSTRUCTED THREE TIMES DAILY  Dispense: 100 strip; Refill: 12    Follow Up Instructions: See PCP in 6-8 weeks   I discussed the assessment and treatment plan with the patient. The patient was provided an opportunity to ask questions and all were answered. The patient agreed with the plan and demonstrated an understanding of the  instructions.   The patient was advised to call back or seek an in-person evaluation if the symptoms worsen or if the condition fails to improve as anticipated.  I provided 11 minutes of non-face-to-face time during this encounter.   Freeman Caldron, PA-C

## 2020-02-03 ENCOUNTER — Other Ambulatory Visit: Payer: Self-pay | Admitting: Family Medicine

## 2020-02-03 DIAGNOSIS — E1169 Type 2 diabetes mellitus with other specified complication: Secondary | ICD-10-CM

## 2020-02-03 DIAGNOSIS — R600 Localized edema: Secondary | ICD-10-CM

## 2020-02-03 DIAGNOSIS — I10 Essential (primary) hypertension: Secondary | ICD-10-CM

## 2020-02-03 MED ORDER — ATORVASTATIN CALCIUM 40 MG PO TABS: 40.0000 mg | ORAL_TABLET | Freq: Every day | ORAL | 0 refills | Status: DC

## 2020-02-03 MED ORDER — INSULIN ASPART PROT & ASPART (70-30 MIX) 100 UNIT/ML ~~LOC~~ SUSP: 48.0000 [IU] | Freq: Two times a day (BID) | SUBCUTANEOUS | 0 refills | Status: DC

## 2020-02-03 MED ORDER — FUROSEMIDE 40 MG PO TABS: 40.0000 mg | ORAL_TABLET | Freq: Every day | ORAL | 0 refills | Status: DC

## 2020-02-03 MED ORDER — LISINOPRIL-HYDROCHLOROTHIAZIDE 20-12.5 MG PO TABS: 1.0000 | ORAL_TABLET | Freq: Every day | ORAL | 0 refills | Status: DC

## 2020-02-03 MED ORDER — TRUEPLUS PEN NEEDLES 32G X 4 MM MISC: 6 refills | Status: DC

## 2020-02-03 MED ORDER — VICTOZA 18 MG/3ML ~~LOC~~ SOPN: PEN_INJECTOR | SUBCUTANEOUS | 0 refills | Status: DC

## 2020-02-03 MED ORDER — METOPROLOL SUCCINATE ER 50 MG PO TB24: 50.0000 mg | ORAL_TABLET | Freq: Every day | ORAL | 0 refills | Status: DC

## 2020-02-03 MED FILL — TRUEPLUS PEN NDL 32GX5/32: 32G X 4 MM | 25 days supply | Qty: 100 | Fill #0

## 2020-02-03 MED FILL — METOPROLOL SUCCINATE ER 50: 50 | 90 days supply | Qty: 90 | Fill #0

## 2020-02-03 NOTE — Telephone Encounter (Signed)
Pt. Request refill to [facility] mail service:  -Atorvastatin -Furosemide - Novolog Mix -Insulin pen needles -Victoza -Lisinopril-HTZ -Metformin -Metoprolol

## 2020-02-03 NOTE — Telephone Encounter (Signed)
Dr. Margarita Rana -   Labs done 09/29/2019 showed eGFR of 41. Would you want to order reduced dose metformin at 500 mg BID? Wanted you to look at this before I approved the refill.

## 2020-02-04 MED ORDER — METFORMIN HCL 500 MG PO TABS: 1000.0000 mg | ORAL_TABLET | Freq: Two times a day (BID) | ORAL | 0 refills | Status: DC

## 2020-02-04 MED FILL — metFORMIN HCL 500 MG TABS: 500 | 90 days supply | Qty: 360 | Fill #0

## 2020-02-09 ENCOUNTER — Other Ambulatory Visit: Payer: Self-pay | Admitting: Pharmacist

## 2020-02-09 ENCOUNTER — Other Ambulatory Visit: Payer: Self-pay | Admitting: Family Medicine

## 2020-02-09 DIAGNOSIS — M10041 Idiopathic gout, right hand: Secondary | ICD-10-CM

## 2020-02-09 DIAGNOSIS — Z794 Long term (current) use of insulin: Secondary | ICD-10-CM

## 2020-02-09 DIAGNOSIS — I10 Essential (primary) hypertension: Secondary | ICD-10-CM

## 2020-02-09 DIAGNOSIS — E1169 Type 2 diabetes mellitus with other specified complication: Secondary | ICD-10-CM

## 2020-02-09 DIAGNOSIS — R600 Localized edema: Secondary | ICD-10-CM

## 2020-02-09 MED ORDER — METFORMIN HCL 500 MG PO TABS: 1000.0000 mg | ORAL_TABLET | Freq: Two times a day (BID) | ORAL | 0 refills | Status: DC

## 2020-02-09 MED ORDER — METOPROLOL SUCCINATE ER 50 MG PO TB24: 50.0000 mg | ORAL_TABLET | Freq: Every day | ORAL | 0 refills | Status: DC

## 2020-02-09 MED ORDER — ATORVASTATIN CALCIUM 40 MG PO TABS: 40.0000 mg | ORAL_TABLET | Freq: Every day | ORAL | 0 refills | Status: DC

## 2020-02-09 MED ORDER — ACCU-CHEK AVIVA PLUS VI STRP: ORAL_STRIP | 12 refills | Status: DC

## 2020-02-09 MED ORDER — ACCU-CHEK AVIVA VI SOLN: 0 refills | Status: AC

## 2020-02-09 MED ORDER — INSULIN ASPART PROT & ASPART (70-30 MIX) 100 UNIT/ML ~~LOC~~ SUSP: 48.0000 [IU] | Freq: Two times a day (BID) | SUBCUTANEOUS | 0 refills | Status: DC

## 2020-02-09 MED ORDER — FUROSEMIDE 40 MG PO TABS: 40.0000 mg | ORAL_TABLET | Freq: Every day | ORAL | 0 refills | Status: DC

## 2020-02-09 MED ORDER — BD SWAB SINGLE USE REGULAR PADS: MEDICATED_PAD | 11 refills | Status: AC

## 2020-02-09 MED ORDER — LISINOPRIL-HYDROCHLOROTHIAZIDE 20-12.5 MG PO TABS: 1.0000 | ORAL_TABLET | Freq: Every day | ORAL | 0 refills | Status: DC

## 2020-02-09 MED ORDER — ACCU-CHEK FASTCLIX LANCETS MISC: 1.0000 | Freq: Three times a day (TID) | 12 refills | Status: DC

## 2020-02-09 MED ORDER — ACCU-CHEK AVIVA PLUS W/DEVICE KIT: PACK | 0 refills | Status: AC

## 2020-02-09 MED FILL — COLCHICINE 0.6 MG TABS: 0.6 | 30 days supply | Qty: 30 | Fill #1

## 2020-02-17 DIAGNOSIS — H40033 Anatomical narrow angle, bilateral: Secondary | ICD-10-CM | POA: Diagnosis not present

## 2020-02-17 DIAGNOSIS — E119 Type 2 diabetes mellitus without complications: Secondary | ICD-10-CM | POA: Diagnosis not present

## 2020-02-28 MED FILL — DICLOFENAC SODIUM 1% GEL: 1 | 17 days supply | Qty: 100 | Fill #1

## 2020-02-29 ENCOUNTER — Telehealth: Payer: Self-pay | Admitting: Family Medicine

## 2020-02-29 NOTE — Telephone Encounter (Signed)
Patient called saying that [facility] was going to fax over forms to be filled out by pt PCP. Patient was informed that once paperwork is received we were going to give the forms to his PCP.

## 2020-03-01 DIAGNOSIS — H524 Presbyopia: Secondary | ICD-10-CM | POA: Diagnosis not present

## 2020-03-01 DIAGNOSIS — H52209 Unspecified astigmatism, unspecified eye: Secondary | ICD-10-CM | POA: Diagnosis not present

## 2020-03-01 NOTE — Telephone Encounter (Signed)
Paperwork has been received and patient will be called once paperwork is ready for pick up.

## 2020-03-13 ENCOUNTER — Other Ambulatory Visit: Payer: Self-pay

## 2020-03-13 ENCOUNTER — Encounter: Payer: Self-pay | Admitting: Family Medicine

## 2020-03-13 ENCOUNTER — Ambulatory Visit: Payer: Medicare HMO | Attending: Family Medicine | Admitting: Family Medicine

## 2020-03-13 VITALS — BP 122/79 | HR 91 | Ht 63.0 in | Wt 267.0 lb

## 2020-03-13 DIAGNOSIS — M10041 Idiopathic gout, right hand: Secondary | ICD-10-CM

## 2020-03-13 DIAGNOSIS — Z794 Long term (current) use of insulin: Secondary | ICD-10-CM

## 2020-03-13 DIAGNOSIS — M19019 Primary osteoarthritis, unspecified shoulder: Secondary | ICD-10-CM | POA: Diagnosis not present

## 2020-03-13 DIAGNOSIS — I10 Essential (primary) hypertension: Secondary | ICD-10-CM

## 2020-03-13 DIAGNOSIS — E1149 Type 2 diabetes mellitus with other diabetic neurological complication: Secondary | ICD-10-CM | POA: Diagnosis not present

## 2020-03-13 DIAGNOSIS — E1169 Type 2 diabetes mellitus with other specified complication: Secondary | ICD-10-CM | POA: Diagnosis not present

## 2020-03-13 DIAGNOSIS — R600 Localized edema: Secondary | ICD-10-CM | POA: Diagnosis not present

## 2020-03-13 LAB — GLUCOSE, POCT (MANUAL RESULT ENTRY): POC Glucose: 57 mg/dl — AB (ref 70–99)

## 2020-03-13 MED ORDER — PREDNISONE 10 MG PO TABS: ORAL_TABLET | ORAL | 0 refills | Status: DC

## 2020-03-13 MED ORDER — LISINOPRIL-HYDROCHLOROTHIAZIDE 20-12.5 MG PO TABS: 1.0000 | ORAL_TABLET | Freq: Every day | ORAL | 1 refills | Status: DC

## 2020-03-13 MED ORDER — GABAPENTIN 300 MG PO CAPS: 300.0000 mg | ORAL_CAPSULE | Freq: Two times a day (BID) | ORAL | 1 refills | Status: DC

## 2020-03-13 MED ORDER — INSULIN ASPART PROT & ASPART (70-30 MIX) 100 UNIT/ML ~~LOC~~ SUSP: 48.0000 [IU] | Freq: Two times a day (BID) | SUBCUTANEOUS | 0 refills | Status: DC

## 2020-03-13 MED ORDER — METOPROLOL SUCCINATE ER 50 MG PO TB24: 50.0000 mg | ORAL_TABLET | Freq: Every day | ORAL | 1 refills | Status: DC

## 2020-03-13 MED ORDER — METFORMIN HCL 500 MG PO TABS: 1000.0000 mg | ORAL_TABLET | Freq: Two times a day (BID) | ORAL | 1 refills | Status: DC

## 2020-03-13 MED ORDER — FUROSEMIDE 40 MG PO TABS: 40.0000 mg | ORAL_TABLET | Freq: Every day | ORAL | 1 refills | Status: DC

## 2020-03-13 MED ORDER — ATORVASTATIN CALCIUM 40 MG PO TABS: 40.0000 mg | ORAL_TABLET | Freq: Every day | ORAL | 1 refills | Status: DC

## 2020-03-13 MED ORDER — VICTOZA 18 MG/3ML ~~LOC~~ SOPN: 1.2000 mg | PEN_INJECTOR | Freq: Every morning | SUBCUTANEOUS | 3 refills | Status: DC

## 2020-03-13 MED FILL — predniSONE 10 MG TABS: 10 | 7 days supply | Qty: 21 | Fill #0

## 2020-03-13 NOTE — Progress Notes (Signed)
Pain in shoulder, knees, and feet.

## 2020-03-13 NOTE — Progress Notes (Signed)
 Subjective:  Patient ID: [name], male    DOB: 02/11/1961  Age: 58 y.o. MRN: [id number]  CC: Diabetes   HPI [name] is a 58-year-old male with a history of type 2 diabetes mellitus(A1c6.1), hypertension, hyperlipidemia, gout, osteoarthritis of the knee (status post bilateral knee arthroscopy in the past) who presents today for an acute visit.  Blood sugars are 118-135 but this morning his blood sugar is 57 in the clinic. He has not had anything to eat since morning and it is 1:45pm ; has a headache but no blurry vision or dizziness.  No hypoglycemic values noted blood sugar log. He complains of pain in his shoulders and knees. Previously received Cortisone injections in his knees by Dr Xu. R shoulder pain >L x 3 months and is described as stabbing preventing him from sleeping and is worse with the rain  His gout has been stable with no flares. He endorses compliance with his antihypertensive and statin. Past Medical History:  Diagnosis Date  . Gouty bursitis, elbow  10/18/2015  . Hx of gout   . Hyperlipidemia   . Hypertension   . Knee pain, chronic   . Type II diabetes mellitus (HCC)     Past Surgical History:  Procedure Laterality Date  . KNEE ARTHROSCOPY Bilateral 12/29/2015   Procedure: ARTHROSCOPY KNEE BILATERAL WITH  PARTIAL MEDIAL AND LATERAL MENISCECTOMY AND FOUR COMPARTMENT SYNOVECTOMY , CHONDRALPLASTIES, PATELLA-FEMORAL CHONDRALPLASTIES BILATERALLY;  Surgeon: John Graves, MD;  Location: [facility] OR;  Service: Orthopedics;  Laterality: Bilateral;  . NO PAST SURGERIES      Family History  Problem Relation Age of Onset  . Diabetes Mother   . Diabetes Father     No Known Allergies  Outpatient Medications Prior to Visit  Medication Sig Dispense Refill  . Accu-Chek FastClix Lancets MISC 1 each by Does not apply route 3 (three) times daily. as directed 102 each 12  . Alcohol Swabs (B-D SINGLE USE SWABS REGULAR) PADS Please use as instructed before checking  blood sugar and before injecting insulin. 100 each 11  . aspirin 81 MG EC tablet Take 1 tablet (81 mg total) by mouth daily. 90 tablet 1  . atorvastatin (LIPITOR) 40 MG tablet Take 1 tablet (40 mg total) by mouth daily at 6 PM. 90 tablet 0  . benzonatate (TESSALON) 100 MG capsule Take 200 mg by mouth 2 (two) times daily as needed for cough.    . Blood Glucose Calibration (ACCU-CHEK AVIVA) SOLN Use to calibrate your glucometer when needed. 1 each 0  . Blood Glucose Monitoring Suppl (ACCU-CHEK AVIVA PLUS) w/Device KIT USE AS INSTRUCTED THREE TIMES DAILY. E11.69 1 kit 0  . colchicine 0.6 MG tablet TAKE 1 TABLET EVERY DAY 90 tablet 0  . diclofenac sodium (VOLTAREN) 1 % GEL Apply 2 g topically 4 (four) times daily. 100 g 3  . FEROSUL 325 (65 Fe) MG tablet TAKE 1 TABLET BY MOUTH DAILY WITH BREAKFAST. 30 tablet 2  . furosemide (LASIX) 40 MG tablet Take 1 tablet (40 mg total) by mouth daily. 90day supply 90 tablet 0  . gabapentin (NEURONTIN) 300 MG capsule TAKE 1 CAPSULE TWICE DAILY 180 capsule 1  . glucose blood (ACCU-CHEK AVIVA PLUS) test strip USE AS INSTRUCTED THREE TIMES DAILY. E11.69 100 each 12  . insulin aspart protamine- aspart (NOVOLOG MIX 70/30) (70-30) 100 UNIT/ML injection Inject 0.48 mLs (48 Units total) into the skin 2 (two) times daily with a meal. 90 mL 0  . Insulin Pen   Needle (TRUEPLUS PEN NEEDLES) 32G X 4 MM MISC Use as directed daily to administer victoza 100 each 6  . Insulin Syringe-Needle U-100 (TRUEPLUS INSULIN SYRINGE) 30G X 5/16" 1 ML MISC USE TWICE DAILY 100 each 12  . lisinopril-hydrochlorothiazide (ZESTORETIC) 20-12.5 MG tablet Take 1 tablet by mouth daily. 90 tablet 0  . meloxicam (MOBIC) 7.5 MG tablet Take 1 tablet (7.5 mg total) by mouth 2 (two) times daily as needed for pain. 60 tablet 2  . metFORMIN (GLUCOPHAGE) 500 MG tablet Take 2 tablets (1,000 mg total) by mouth 2 (two) times daily with a meal. 360 tablet 0  . metoprolol succinate (TOPROL-XL) 50 MG 24 hr tablet Take 1  tablet (50 mg total) by mouth daily. 90 tablet 0  . predniSONE (DELTASONE) 10 MG tablet 6,5,4,3,2,1 take each days dose all at once with food 21 tablet 0  . traZODone (DESYREL) 50 MG tablet Take 1 tablet (50 mg total) by mouth at bedtime. 30 tablet 3  . VICTOZA 18 MG/3ML SOPN INJECT 1.2MG SUBCUTANEOUSLY EVERY DAY WITH BREAKFAST 18 mL 0  . cetirizine (ZYRTEC) 10 MG tablet TAKE 1 TABLET (10 MG TOTAL) BY MOUTH DAILY. (Patient not taking: Reported on 06/29/2019) 30 tablet 3  . HYDROcodone-acetaminophen (NORCO/VICODIN) 5-325 MG tablet Take 1 tablet by mouth every 6 (six) hours as needed for severe pain. (Patient not taking: Reported on 06/29/2019) 5 tablet 0   No facility-administered medications prior to visit.     ROS Review of Systems  Constitutional: Negative for activity change and appetite change.  HENT: Negative for sinus pressure and sore throat.   Eyes: Negative for visual disturbance.  Respiratory: Negative for cough, chest tightness and shortness of breath.   Cardiovascular: Negative for chest pain and leg swelling.  Gastrointestinal: Negative for abdominal distention, abdominal pain, constipation and diarrhea.  Endocrine: Negative.   Genitourinary: Negative for dysuria.  Musculoskeletal:       See hpi  Skin: Negative for rash.  Allergic/Immunologic: Negative.   Neurological: Negative for weakness, light-headedness and numbness.  Psychiatric/Behavioral: Negative for dysphoric mood and suicidal ideas.    Objective:  BP 122/79   Pulse 91   Ht 5' 3" (1.6 m)   Wt 267 lb (121.1 kg)   SpO2 99%   BMI 47.30 kg/m   BP/Weight 03/13/2020 09/29/2019 06/29/2019  Systolic BP 122 98 150  Diastolic BP 79 64 96  Wt. (Lbs) 267 265 263  BMI 47.3 46.94 46.59      Physical Exam Constitutional:      Appearance: He is well-developed.  Neck:     Vascular: No JVD.  Cardiovascular:     Rate and Rhythm: Normal rate.     Heart sounds: Normal heart sounds. No murmur.  Pulmonary:      Effort: Pulmonary effort is normal.     Breath sounds: Normal breath sounds. No wheezing or rales.  Chest:     Chest wall: No tenderness.  Abdominal:     General: Bowel sounds are normal. There is no distension.     Palpations: Abdomen is soft. There is no mass.     Tenderness: There is no abdominal tenderness.  Musculoskeletal:     Right lower leg: No edema.     Left lower leg: No edema.     Comments: Tenderness on palpation of right AC joint but normal range of motion in the right upper extremity Left shoulder is normal. Slightly limited range of motion in both knees with associated tenderness and crepitus    Neurological:     Mental Status: He is alert and oriented to person, place, and time.  Psychiatric:        Mood and Affect: Mood normal.     CMP Latest Ref Rng & Units 09/29/2019 06/14/2019 05/29/2019  Glucose 65 - 99 mg/dL 45(L) 61(L) 300(H)  BUN 6 - 24 mg/dL 46(H) 26(H) 43(H)  Creatinine 0.76 - 1.27 mg/dL 2.02(H) 1.57(H) 1.57(H)  Sodium 134 - 144 mmol/L 141 144 133(L)  Potassium 3.5 - 5.2 mmol/L 3.9 4.5 3.5  Chloride 96 - 106 mmol/L 101 100 93(L)  CO2 20 - 29 mmol/L 21 26 24  Calcium 8.7 - 10.2 mg/dL 9.5 9.8 9.1  Total Protein 6.0 - 8.5 g/dL - - -  Total Bilirubin 0.0 - 1.2 mg/dL - - -  Alkaline Phos 39 - 117 IU/L - - -  AST 0 - 40 IU/L - - -  ALT 0 - 44 IU/L - - -    Lipid Panel     Component Value Date/Time   CHOL 111 08/12/2017 0900   TRIG 152 (H) 08/12/2017 0900   HDL 30 (L) 08/12/2017 0900   CHOLHDL 3.7 08/12/2017 0900   CHOLHDL 4.5 10/10/2016 1125   VLDL 23 10/10/2016 1125   LDLCALC 51 08/12/2017 0900    CBC    Component Value Date/Time   WBC 6.3 09/29/2019 1016   WBC 9.3 05/29/2019 0740   RBC 5.02 09/29/2019 1016   RBC 4.82 05/29/2019 0740   HGB 11.9 (L) 09/29/2019 1016   HCT 39.8 09/29/2019 1016   PLT 179 09/29/2019 1016   MCV 79 09/29/2019 1016   MCH 23.7 (L) 09/29/2019 1016   MCH 23.7 (L) 05/29/2019 0740   MCHC 29.9 (L) 09/29/2019 1016    MCHC 31.4 05/29/2019 0740   RDW 16.0 (H) 09/29/2019 1016   LYMPHSABS 2.7 09/29/2019 1016   MONOABS 1.1 (H) 05/29/2019 0740   EOSABS 0.1 09/29/2019 1016   BASOSABS 0.0 09/29/2019 1016    Lab Results  Component Value Date   HGBA1C 6.1 09/29/2019    Assessment & Plan:   1. Type 2 diabetes mellitus with other specified complication, with long-term current use of insulin (HCC) Controlled with A1c of 6.1 We will send of A1c today and adjust regimen accordingly For his hypoglycemia we have given him some crackers in the clinic and he has been advised to avoid skipping meals His blood sugar log does not reveal hypoglycemic values at home Continue diabetic diet - POCT glucose (manual entry) - Hemoglobin A1c - CMP14+EGFR - Lipid panel - atorvastatin (LIPITOR) 40 MG tablet; Take 1 tablet (40 mg total) by mouth daily at 6 PM.  Dispense: 90 tablet; Refill: 1 - insulin aspart protamine- aspart (NOVOLOG MIX 70/30) (70-30) 100 UNIT/ML injection; Inject 0.48 mLs (48 Units total) into the skin 2 (two) times daily with a meal.  Dispense: 90 mL; Refill: 0 - metFORMIN (GLUCOPHAGE) 500 MG tablet; Take 2 tablets (1,000 mg total) by mouth 2 (two) times daily with a meal.  Dispense: 360 tablet; Refill: 1 - liraglutide (VICTOZA) 18 MG/3ML SOPN; Inject 0.2 mLs (1.2 mg total) into the skin every morning.  Dispense: 18 mL; Refill: 3  2. Acute idiopathic gout of right hand No acute flare - predniSONE (DELTASONE) 10 MG tablet; 6,5,4,3,2,1 take each days dose all at once with food  Dispense: 21 tablet; Refill: 0 - Uric Acid  3. Pedal edema Chronic Use compression stockings - furosemide (LASIX) 40 MG tablet; Take 1   tablet (40 mg total) by mouth daily. 90day supply  Dispense: 90 tablet; Refill: 1  4. Other diabetic neurological complication associated with type 2 diabetes mellitus (HCC) Stable - gabapentin (NEURONTIN) 300 MG capsule; Take 1 capsule (300 mg total) by mouth 2 (two) times daily.  Dispense: 180  capsule; Refill: 1  5. Essential hypertension Controlled Counseled on blood pressure goal of less than 130/80, low-sodium, DASH diet, medication compliance, 150 minutes of moderate intensity exercise per week. Discussed medication compliance, adverse effects. - lisinopril-hydrochlorothiazide (ZESTORETIC) 20-12.5 MG tablet; Take 1 tablet by mouth daily.  Dispense: 90 tablet; Refill: 1 - metoprolol succinate (TOPROL-XL) 50 MG 24 hr tablet; Take 1 tablet (50 mg total) by mouth daily.  Dispense: 90 tablet; Refill: 1  6. Shoulder arthritis Short course of prednisone Will refer to PT If symptoms persist consider orthopedic   Follow-up: No follow-ups on file.       Enobong Newlin, MD, FAAFP. [facility] [place], [place] [phone]   03/13/2020, 1:46 PM 

## 2020-03-14 LAB — CMP14+EGFR
ALT: 18 IU/L (ref 0–44)
AST: 22 IU/L (ref 0–40)
Albumin/Globulin Ratio: 1.8 (ref 1.2–2.2)
Albumin: 4.7 g/dL (ref 3.8–4.9)
Alkaline Phosphatase: 121 IU/L — ABNORMAL HIGH (ref 39–117)
BUN/Creatinine Ratio: 17 (ref 10–24)
BUN: 29 mg/dL — ABNORMAL HIGH (ref 8–27)
Bilirubin Total: 0.4 mg/dL (ref 0.0–1.2)
CO2: 26 mmol/L (ref 20–29)
Calcium: 9.6 mg/dL (ref 8.6–10.2)
Chloride: 100 mmol/L (ref 96–106)
Creatinine, Ser: 1.74 mg/dL — ABNORMAL HIGH (ref 0.76–1.27)
GFR calc Af Amer: 48 mL/min/{1.73_m2} — ABNORMAL LOW (ref >59)
GFR calc non Af Amer: 42 mL/min/{1.73_m2} — ABNORMAL LOW (ref >59)
Globulin, Total: 2.6 g/dL (ref 1.5–4.5)
Glucose: 47 mg/dL — ABNORMAL LOW (ref 65–99)
Potassium: 4 mmol/L (ref 3.5–5.2)
Sodium: 142 mmol/L (ref 134–144)
Total Protein: 7.3 g/dL (ref 6.0–8.5)

## 2020-03-14 LAB — HEMOGLOBIN A1C
Est. average glucose Bld gHb Est-mCnc: 134 mg/dL
Hgb A1c MFr Bld: 6.3 % — ABNORMAL HIGH (ref 4.8–5.6)

## 2020-03-14 LAB — LIPID PANEL
Chol/HDL Ratio: 3.9 ratio (ref 0.0–5.0)
Cholesterol, Total: 114 mg/dL (ref 100–199)
HDL: 29 mg/dL — ABNORMAL LOW (ref >39)
LDL Chol Calc (NIH): 61 mg/dL (ref 0–99)
Triglycerides: 135 mg/dL (ref 0–149)
VLDL Cholesterol Cal: 24 mg/dL (ref 5–40)

## 2020-03-14 LAB — URIC ACID: Uric Acid: 14.1 mg/dL — ABNORMAL HIGH (ref 3.8–8.4)

## 2020-03-14 MED ORDER — COLCHICINE 0.6 MG PO TABS: ORAL_TABLET | ORAL | 1 refills | Status: DC

## 2020-03-14 MED ORDER — ALLOPURINOL 300 MG PO TABS
300.0000 mg | ORAL_TABLET | Freq: Every day | ORAL | 6 refills | Status: DC
Start: 2020-03-14 — End: 2020-06-27

## 2020-03-14 MED FILL — ALLOPURINOL 300 MG TAB: 300 | 30 days supply | Qty: 30 | Fill #0

## 2020-03-17 ENCOUNTER — Telehealth: Payer: Self-pay

## 2020-03-17 ENCOUNTER — Ambulatory Visit: Payer: Medicare HMO | Admitting: Podiatry

## 2020-03-17 NOTE — Telephone Encounter (Signed)
-----   Message from Charlott Rakes, MD sent at 03/14/2020  8:49 AM EDT ----- Labs reveal elevated uric acid which indicates a gout flare.  I have sent a second medication for gout to his pharmacy.  Cholesterol is stable, kidney function is slightly abnormal but has improved.

## 2020-03-17 NOTE — Telephone Encounter (Signed)
Patient name and DOB has been verified Patient was informed of lab results. Patient had no questions.  

## 2020-03-21 MED FILL — ACCU-CHEK FASTCLIX LANCETS: 34 days supply | Qty: 102 | Fill #2

## 2020-05-04 DIAGNOSIS — M19011 Primary osteoarthritis, right shoulder: Secondary | ICD-10-CM | POA: Diagnosis not present

## 2020-05-04 DIAGNOSIS — E119 Type 2 diabetes mellitus without complications: Secondary | ICD-10-CM | POA: Diagnosis not present

## 2020-05-04 DIAGNOSIS — M79645 Pain in left finger(s): Secondary | ICD-10-CM | POA: Diagnosis not present

## 2020-05-04 DIAGNOSIS — R635 Abnormal weight gain: Secondary | ICD-10-CM | POA: Diagnosis not present

## 2020-05-04 DIAGNOSIS — Z125 Encounter for screening for malignant neoplasm of prostate: Secondary | ICD-10-CM | POA: Diagnosis not present

## 2020-05-04 DIAGNOSIS — E7800 Pure hypercholesterolemia, unspecified: Secondary | ICD-10-CM | POA: Diagnosis not present

## 2020-05-04 DIAGNOSIS — M7989 Other specified soft tissue disorders: Secondary | ICD-10-CM | POA: Diagnosis not present

## 2020-05-04 DIAGNOSIS — M25511 Pain in right shoulder: Secondary | ICD-10-CM | POA: Diagnosis not present

## 2020-05-04 DIAGNOSIS — M109 Gout, unspecified: Secondary | ICD-10-CM | POA: Diagnosis not present

## 2020-05-11 DIAGNOSIS — E7800 Pure hypercholesterolemia, unspecified: Secondary | ICD-10-CM | POA: Diagnosis not present

## 2020-05-11 DIAGNOSIS — E1140 Type 2 diabetes mellitus with diabetic neuropathy, unspecified: Secondary | ICD-10-CM | POA: Diagnosis not present

## 2020-05-11 DIAGNOSIS — E1122 Type 2 diabetes mellitus with diabetic chronic kidney disease: Secondary | ICD-10-CM | POA: Diagnosis not present

## 2020-05-11 DIAGNOSIS — R600 Localized edema: Secondary | ICD-10-CM | POA: Diagnosis not present

## 2020-05-11 DIAGNOSIS — M109 Gout, unspecified: Secondary | ICD-10-CM | POA: Diagnosis not present

## 2020-05-11 DIAGNOSIS — I10 Essential (primary) hypertension: Secondary | ICD-10-CM | POA: Diagnosis not present

## 2020-05-25 ENCOUNTER — Other Ambulatory Visit: Payer: Self-pay | Admitting: Family Medicine

## 2020-05-25 DIAGNOSIS — Z794 Long term (current) use of insulin: Secondary | ICD-10-CM

## 2020-05-26 DIAGNOSIS — W19XXXA Unspecified fall, initial encounter: Secondary | ICD-10-CM | POA: Diagnosis not present

## 2020-05-26 DIAGNOSIS — E7800 Pure hypercholesterolemia, unspecified: Secondary | ICD-10-CM | POA: Diagnosis not present

## 2020-05-26 DIAGNOSIS — R600 Localized edema: Secondary | ICD-10-CM | POA: Diagnosis not present

## 2020-05-26 DIAGNOSIS — M19011 Primary osteoarthritis, right shoulder: Secondary | ICD-10-CM | POA: Diagnosis not present

## 2020-05-26 DIAGNOSIS — E119 Type 2 diabetes mellitus without complications: Secondary | ICD-10-CM | POA: Diagnosis not present

## 2020-05-26 DIAGNOSIS — M109 Gout, unspecified: Secondary | ICD-10-CM | POA: Diagnosis not present

## 2020-05-26 DIAGNOSIS — M1710 Unilateral primary osteoarthritis, unspecified knee: Secondary | ICD-10-CM | POA: Diagnosis not present

## 2020-05-26 DIAGNOSIS — F70 Mild intellectual disabilities: Secondary | ICD-10-CM | POA: Diagnosis not present

## 2020-05-26 DIAGNOSIS — R269 Unspecified abnormalities of gait and mobility: Secondary | ICD-10-CM | POA: Diagnosis not present

## 2020-05-30 DIAGNOSIS — R600 Localized edema: Secondary | ICD-10-CM | POA: Diagnosis not present

## 2020-05-30 DIAGNOSIS — M109 Gout, unspecified: Secondary | ICD-10-CM | POA: Diagnosis not present

## 2020-06-06 DIAGNOSIS — M109 Gout, unspecified: Secondary | ICD-10-CM | POA: Diagnosis not present

## 2020-06-06 DIAGNOSIS — F70 Mild intellectual disabilities: Secondary | ICD-10-CM | POA: Diagnosis not present

## 2020-06-06 DIAGNOSIS — Z794 Long term (current) use of insulin: Secondary | ICD-10-CM | POA: Diagnosis not present

## 2020-06-06 DIAGNOSIS — M170 Bilateral primary osteoarthritis of knee: Secondary | ICD-10-CM | POA: Diagnosis not present

## 2020-06-06 DIAGNOSIS — E7800 Pure hypercholesterolemia, unspecified: Secondary | ICD-10-CM | POA: Diagnosis not present

## 2020-06-06 DIAGNOSIS — R600 Localized edema: Secondary | ICD-10-CM | POA: Diagnosis not present

## 2020-06-06 DIAGNOSIS — M19011 Primary osteoarthritis, right shoulder: Secondary | ICD-10-CM | POA: Diagnosis not present

## 2020-06-06 DIAGNOSIS — E1140 Type 2 diabetes mellitus with diabetic neuropathy, unspecified: Secondary | ICD-10-CM | POA: Diagnosis not present

## 2020-06-08 DIAGNOSIS — M19011 Primary osteoarthritis, right shoulder: Secondary | ICD-10-CM | POA: Diagnosis not present

## 2020-06-08 DIAGNOSIS — M170 Bilateral primary osteoarthritis of knee: Secondary | ICD-10-CM | POA: Diagnosis not present

## 2020-06-08 DIAGNOSIS — Z794 Long term (current) use of insulin: Secondary | ICD-10-CM | POA: Diagnosis not present

## 2020-06-08 DIAGNOSIS — E1140 Type 2 diabetes mellitus with diabetic neuropathy, unspecified: Secondary | ICD-10-CM | POA: Diagnosis not present

## 2020-06-08 DIAGNOSIS — E7800 Pure hypercholesterolemia, unspecified: Secondary | ICD-10-CM | POA: Diagnosis not present

## 2020-06-08 DIAGNOSIS — R600 Localized edema: Secondary | ICD-10-CM | POA: Diagnosis not present

## 2020-06-08 DIAGNOSIS — M109 Gout, unspecified: Secondary | ICD-10-CM | POA: Diagnosis not present

## 2020-06-08 DIAGNOSIS — F70 Mild intellectual disabilities: Secondary | ICD-10-CM | POA: Diagnosis not present

## 2020-06-12 ENCOUNTER — Encounter: Payer: Self-pay | Admitting: Family Medicine

## 2020-06-12 ENCOUNTER — Other Ambulatory Visit: Payer: Self-pay

## 2020-06-12 ENCOUNTER — Ambulatory Visit: Payer: Medicare HMO | Attending: Family Medicine | Admitting: Family Medicine

## 2020-06-12 VITALS — BP 113/75 | HR 89 | Temp 97.7°F | Ht 63.0 in | Wt 257.0 lb

## 2020-06-12 DIAGNOSIS — Z794 Long term (current) use of insulin: Secondary | ICD-10-CM

## 2020-06-12 DIAGNOSIS — M19011 Primary osteoarthritis, right shoulder: Secondary | ICD-10-CM | POA: Diagnosis not present

## 2020-06-12 DIAGNOSIS — H9201 Otalgia, right ear: Secondary | ICD-10-CM

## 2020-06-12 DIAGNOSIS — E1140 Type 2 diabetes mellitus with diabetic neuropathy, unspecified: Secondary | ICD-10-CM | POA: Diagnosis not present

## 2020-06-12 DIAGNOSIS — E1169 Type 2 diabetes mellitus with other specified complication: Secondary | ICD-10-CM | POA: Diagnosis not present

## 2020-06-12 DIAGNOSIS — M109 Gout, unspecified: Secondary | ICD-10-CM | POA: Diagnosis not present

## 2020-06-12 DIAGNOSIS — F70 Mild intellectual disabilities: Secondary | ICD-10-CM | POA: Diagnosis not present

## 2020-06-12 DIAGNOSIS — R600 Localized edema: Secondary | ICD-10-CM | POA: Diagnosis not present

## 2020-06-12 DIAGNOSIS — M170 Bilateral primary osteoarthritis of knee: Secondary | ICD-10-CM | POA: Diagnosis not present

## 2020-06-12 DIAGNOSIS — E7800 Pure hypercholesterolemia, unspecified: Secondary | ICD-10-CM | POA: Diagnosis not present

## 2020-06-12 LAB — GLUCOSE, POCT (MANUAL RESULT ENTRY): POC Glucose: 109 mg/dl — AB (ref 70–99)

## 2020-06-12 MED ORDER — DEBROX 6.5 % OT SOLN: 5.0000 [drp] | Freq: Two times a day (BID) | OTIC | 0 refills | Status: DC

## 2020-06-12 MED ORDER — CIPROFLOXACIN-DEXAMETHASONE 0.3-0.1 % OT SUSP: 4.0000 [drp] | Freq: Two times a day (BID) | OTIC | 0 refills | Status: DC

## 2020-06-12 MED FILL — ACCU-CHEK FASTCLIX LANCETS: 34 days supply | Qty: 102 | Fill #3

## 2020-06-12 MED FILL — DICLOFENAC SODIUM 1% GEL: 1 | 17 days supply | Qty: 100 | Fill #3

## 2020-06-12 NOTE — Progress Notes (Signed)
 Subjective:  Patient ID: [name], male    DOB: 06/07/1961  Age: 58 y.o. MRN: [id number]  CC: Follow-up (Pt. is here for 3 months F.U. ) and Ear Pain (Pt. stated his right ear hurts when he eat. Pt. stated it has been hurting for 3 days. Per patient he had put sweet oil in his right ear. )   HPI [name]  is a58-year-old male with a history of type 2 diabetes mellitus(A1c6.1), hypertension, hyperlipidemia, gout, osteoarthritis of the knee (status post bilateral knee arthroscopy in the past) who presents today for an acute concern.  He complains of R ear ache and also the fact that it hurts to eat on that side. Symptoms have been present x3 days. Denies tooth ache or hearing loss. He applied sweet oil with no relief. Denies presence of sinus symptoms, fever, hearing loss.  Past Medical History:  Diagnosis Date  . Gouty bursitis, elbow  10/18/2015  . Hx of gout   . Hyperlipidemia   . Hypertension   . Knee pain, chronic   . Type II diabetes mellitus (HCC)     Past Surgical History:  Procedure Laterality Date  . KNEE ARTHROSCOPY Bilateral 12/29/2015   Procedure: ARTHROSCOPY KNEE BILATERAL WITH  PARTIAL MEDIAL AND LATERAL MENISCECTOMY AND FOUR COMPARTMENT SYNOVECTOMY , CHONDRALPLASTIES, PATELLA-FEMORAL CHONDRALPLASTIES BILATERALLY;  Surgeon: John Graves, MD;  Location: [facility] OR;  Service: Orthopedics;  Laterality: Bilateral;  . NO PAST SURGERIES      Family History  Problem Relation Age of Onset  . Diabetes Mother   . Diabetes Father     No Known Allergies  Outpatient Medications Prior to Visit  Medication Sig Dispense Refill  . Accu-Chek FastClix Lancets MISC 1 each by Does not apply route 3 (three) times daily. as directed 102 each 12  . Alcohol Swabs (B-D SINGLE USE SWABS REGULAR) PADS Please use as instructed before checking blood sugar and before injecting insulin. 100 each 11  . allopurinol (ZYLOPRIM) 300 MG tablet Take 1 tablet (300 mg total) by mouth daily.  30 tablet 6  . aspirin 81 MG EC tablet Take 1 tablet (81 mg total) by mouth daily. 90 tablet 1  . atorvastatin (LIPITOR) 40 MG tablet Take 1 tablet (40 mg total) by mouth daily at 6 PM. 90 tablet 1  . benzonatate (TESSALON) 100 MG capsule Take 200 mg by mouth 2 (two) times daily as needed for cough.    . Blood Glucose Calibration (ACCU-CHEK AVIVA) SOLN Use to calibrate your glucometer when needed. 1 each 0  . Blood Glucose Monitoring Suppl (ACCU-CHEK AVIVA PLUS) w/Device KIT USE AS INSTRUCTED THREE TIMES DAILY. E11.69 1 kit 0  . cetirizine (ZYRTEC) 10 MG tablet TAKE 1 TABLET (10 MG TOTAL) BY MOUTH DAILY. 30 tablet 3  . colchicine 0.6 MG tablet Take 2 tabs (1.2 mg) at the onset of a gout flare.  May repeat in 1 hour if symptoms persist. 30 tablet 1  . diclofenac sodium (VOLTAREN) 1 % GEL Apply 2 g topically 4 (four) times daily. 100 g 3  . DROPLET PEN NEEDLES 32G X 4 MM MISC USE AS DIRECTED DAILY TO ADMINISTER VICTOZA 100 each 6  . FEROSUL 325 (65 Fe) MG tablet TAKE 1 TABLET BY MOUTH DAILY WITH BREAKFAST. 30 tablet 2  . furosemide (LASIX) 40 MG tablet Take 1 tablet (40 mg total) by mouth daily. 90day supply 90 tablet 1  . gabapentin (NEURONTIN) 300 MG capsule Take 1 capsule (300 mg total)   by mouth 2 (two) times daily. 180 capsule 1  . glucose blood (ACCU-CHEK AVIVA PLUS) test strip USE AS INSTRUCTED THREE TIMES DAILY. E11.69 100 each 12  . HYDROcodone-acetaminophen (NORCO/VICODIN) 5-325 MG tablet Take 1 tablet by mouth every 6 (six) hours as needed for severe pain. 5 tablet 0  . Insulin Syringe-Needle U-100 (TRUEPLUS INSULIN SYRINGE) 30G X 5/16" 1 ML MISC USE TWICE DAILY 100 each 12  . liraglutide (VICTOZA) 18 MG/3ML SOPN Inject 0.2 mLs (1.2 mg total) into the skin every morning. 18 mL 3  . lisinopril-hydrochlorothiazide (ZESTORETIC) 20-12.5 MG tablet Take 1 tablet by mouth daily. 90 tablet 1  . metFORMIN (GLUCOPHAGE) 500 MG tablet Take 2 tablets (1,000 mg total) by mouth 2 (two) times daily with a  meal. 360 tablet 1  . metoprolol succinate (TOPROL-XL) 50 MG 24 hr tablet Take 1 tablet (50 mg total) by mouth daily. 90 tablet 1  . NOVOLOG MIX 70/30 (70-30) 100 UNIT/ML injection INJECT 48 UNITS SUBCUTANEOUSLY TWICE DAILY WITH A MEAL (DOSE CHANGE) 90 mL 0  . predniSONE (DELTASONE) 10 MG tablet 6,5,4,3,2,1 take each days dose all at once with food 21 tablet 0  . traZODone (DESYREL) 50 MG tablet Take 1 tablet (50 mg total) by mouth at bedtime. 30 tablet 3   No facility-administered medications prior to visit.     ROS Review of Systems  Constitutional: Negative for activity change and appetite change.  HENT: Positive for ear pain. Negative for sinus pressure and sore throat.   Eyes: Negative for visual disturbance.  Respiratory: Negative for cough, chest tightness and shortness of breath.   Cardiovascular: Negative for chest pain and leg swelling.  Gastrointestinal: Negative for abdominal distention, abdominal pain, constipation and diarrhea.  Endocrine: Negative.   Genitourinary: Negative for dysuria.  Musculoskeletal: Negative for joint swelling and myalgias.  Skin: Negative for rash.  Allergic/Immunologic: Negative.   Neurological: Negative for weakness, light-headedness and numbness.  Psychiatric/Behavioral: Negative for dysphoric mood and suicidal ideas.    Objective:  BP 113/75 (BP Location: Right Arm, Patient Position: Sitting, Cuff Size: Large)   Pulse 89   Temp 97.7 F (36.5 C) (Temporal)   Ht 5' 3" (1.6 m)   Wt (!) 257 lb (116.6 kg)   SpO2 98%   BMI 45.53 kg/m   BP/Weight 06/12/2020 03/13/2020 09/29/2019  Systolic BP 113 122 98  Diastolic BP 75 79 64  Wt. (Lbs) 257 267 265  BMI 45.53 47.3 46.94      Physical Exam Constitutional:      Appearance: He is well-developed.  HENT:     Head:     Comments: Tenderness on performing right otoscopy    Right Ear: There is impacted cerumen.     Left Ear: There is no impacted cerumen.  Neck:     Vascular: No JVD.    Cardiovascular:     Rate and Rhythm: Normal rate.     Heart sounds: Normal heart sounds. No murmur heard.   Pulmonary:     Effort: Pulmonary effort is normal.     Breath sounds: Normal breath sounds. No wheezing or rales.  Chest:     Chest wall: No tenderness.  Abdominal:     General: Bowel sounds are normal. There is no distension.     Palpations: Abdomen is soft. There is no mass.     Tenderness: There is no abdominal tenderness.  Musculoskeletal:        General: Normal range of motion.       Right lower leg: No edema.     Left lower leg: No edema.  Neurological:     Mental Status: He is alert and oriented to person, place, and time.  Psychiatric:        Mood and Affect: Mood normal.      CMP Latest Ref Rng & Units 03/13/2020 09/29/2019 06/14/2019  Glucose 65 - 99 mg/dL 47(L) 45(L) 61(L)  BUN 8 - 27 mg/dL 29(H) 46(H) 26(H)  Creatinine 0.76 - 1.27 mg/dL 1.74(H) 2.02(H) 1.57(H)  Sodium 134 - 144 mmol/L 142 141 144  Potassium 3.5 - 5.2 mmol/L 4.0 3.9 4.5  Chloride 96 - 106 mmol/L 100 101 100  CO2 20 - 29 mmol/L 26 21 26  Calcium 8.6 - 10.2 mg/dL 9.6 9.5 9.8  Total Protein 6.0 - 8.5 g/dL 7.3 - -  Total Bilirubin 0.0 - 1.2 mg/dL 0.4 - -  Alkaline Phos 39 - 117 IU/L 121(H) - -  AST 0 - 40 IU/L 22 - -  ALT 0 - 44 IU/L 18 - -    Lipid Panel     Component Value Date/Time   CHOL 114 03/13/2020 1420   TRIG 135 03/13/2020 1420   HDL 29 (L) 03/13/2020 1420   CHOLHDL 3.9 03/13/2020 1420   CHOLHDL 4.5 10/10/2016 1125   VLDL 23 10/10/2016 1125   LDLCALC 61 03/13/2020 1420    CBC    Component Value Date/Time   WBC 6.3 09/29/2019 1016   WBC 9.3 05/29/2019 0740   RBC 5.02 09/29/2019 1016   RBC 4.82 05/29/2019 0740   HGB 11.9 (L) 09/29/2019 1016   HCT 39.8 09/29/2019 1016   PLT 179 09/29/2019 1016   MCV 79 09/29/2019 1016   MCH 23.7 (L) 09/29/2019 1016   MCH 23.7 (L) 05/29/2019 0740   MCHC 29.9 (L) 09/29/2019 1016   MCHC 31.4 05/29/2019 0740   RDW 16.0 (H) 09/29/2019  1016   LYMPHSABS 2.7 09/29/2019 1016   MONOABS 1.1 (H) 05/29/2019 0740   EOSABS 0.1 09/29/2019 1016   BASOSABS 0.0 09/29/2019 1016    Lab Results  Component Value Date   HGBA1C 6.3 (H) 03/13/2020    Assessment & Plan:   1. Type 2 diabetes mellitus with other specified complication, with long-term current use of insulin (HCC) Controlled with A1c of 6.3 - Glucose (CBG)  2. Right ear pain Due to significant pain during otoscopy I will add on Ciprodex to Debrox as I am unable to fully visualize his TM to exclude infection - carbamide peroxide (DEBROX) 6.5 % OTIC solution; Place 5 drops into the right ear 2 (two) times daily.  Dispense: 15 mL; Refill: 0 - ciprofloxacin-dexamethasone (CIPRODEX) OTIC suspension; Place 4 drops into the right ear 2 (two) times daily.  Dispense: 7.5 mL; Refill: 0    Meds ordered this encounter  Medications  . carbamide peroxide (DEBROX) 6.5 % OTIC solution    Sig: Place 5 drops into the right ear 2 (two) times daily.    Dispense:  15 mL    Refill:  0  . ciprofloxacin-dexamethasone (CIPRODEX) OTIC suspension    Sig: Place 4 drops into the right ear 2 (two) times daily.    Dispense:  7.5 mL    Refill:  0    Follow-up: Return in about 3 months (around 09/12/2020) for Chronic disease management.       Enobong Newlin, MD, FAAFP. [facility] [place], [place] [phone]   06/12/2020, 9:58 PM 

## 2020-06-12 NOTE — Patient Instructions (Signed)
Earwax Buildup, Adult The ears produce a substance called earwax that helps keep bacteria out of the ear and protects the skin in the ear canal. Occasionally, earwax can build up in the ear and cause discomfort or hearing loss. What increases the risk? This condition is more likely to develop in people who:  Are male.  Are elderly.  Naturally produce more earwax.  Clean their ears often with cotton swabs.  Use earplugs often.  Use in-ear headphones often.  Wear hearing aids.  Have narrow ear canals.  Have earwax that is overly thick or sticky.  Have eczema.  Are dehydrated.  Have excess hair in the ear canal. What are the signs or symptoms? Symptoms of this condition include:  Reduced or muffled hearing.  A feeling of fullness in the ear or feeling that the ear is plugged.  Fluid coming from the ear.  Ear pain.  Ear itch.  Ringing in the ear.  Coughing.  An obvious piece of earwax that can be seen inside the ear canal. How is this diagnosed? This condition may be diagnosed based on:  Your symptoms.  Your medical history.  An ear exam. During the exam, your health care provider will look into your ear with an instrument called an otoscope. You may have tests, including a hearing test. How is this treated? This condition may be treated by:  Using ear drops to soften the earwax.  Having the earwax removed by a health care provider. The health care provider may: ? Flush the ear with water. ? Use an instrument that has a loop on the end (curette). ? Use a suction device.  Surgery to remove the wax buildup. This may be done in severe cases. Follow these instructions at home:   Take over-the-counter and prescription medicines only as told by your health care provider.  Do not put any objects, including cotton swabs, into your ear. You can clean the opening of your ear canal with a washcloth or facial tissue.  Follow instructions from your health care  provider about cleaning your ears. Do not over-clean your ears.  Drink enough fluid to keep your urine clear or pale yellow. This will help to thin the earwax.  Keep all follow-up visits as told by your health care provider. If earwax builds up in your ears often or if you use hearing aids, consider seeing your health care provider for routine, preventive ear cleanings. Ask your health care provider how often you should schedule your cleanings.  If you have hearing aids, clean them according to instructions from the manufacturer and your health care provider. Contact a health care provider if:  You have ear pain.  You develop a fever.  You have blood, pus, or other fluid coming from your ear.  You have hearing loss.  You have ringing in your ears that does not go away.  Your symptoms do not improve with treatment.  You feel like the room is spinning (vertigo). Summary  Earwax can build up in the ear and cause discomfort or hearing loss.  The most common symptoms of this condition include reduced or muffled hearing and a feeling of fullness in the ear or feeling that the ear is plugged.  This condition may be diagnosed based on your symptoms, your medical history, and an ear exam.  This condition may be treated by using ear drops to soften the earwax or by having the earwax removed by a health care provider.  Do not put any   objects, including cotton swabs, into your ear. You can clean the opening of your ear canal with a washcloth or facial tissue. This information is not intended to replace advice given to you by your health care provider. Make sure you discuss any questions you have with your health care provider. Document Revised: 10/17/2017 Document Reviewed: 01/15/2017 Elsevier Patient Education  2020 [employer]  

## 2020-06-13 ENCOUNTER — Other Ambulatory Visit: Payer: Self-pay | Admitting: Pharmacist

## 2020-06-13 MED ORDER — MITIGARE 0.6 MG PO CAPS: ORAL_CAPSULE | ORAL | 2 refills | Status: DC

## 2020-06-13 MED FILL — MITIGARE 0.6 MG CAPSULE: 0.6 | 10 days supply | Qty: 30 | Fill #0

## 2020-06-14 ENCOUNTER — Telehealth: Payer: Self-pay | Admitting: Family Medicine

## 2020-06-14 DIAGNOSIS — E1140 Type 2 diabetes mellitus with diabetic neuropathy, unspecified: Secondary | ICD-10-CM | POA: Diagnosis not present

## 2020-06-14 DIAGNOSIS — F70 Mild intellectual disabilities: Secondary | ICD-10-CM | POA: Diagnosis not present

## 2020-06-14 DIAGNOSIS — Z794 Long term (current) use of insulin: Secondary | ICD-10-CM | POA: Diagnosis not present

## 2020-06-14 DIAGNOSIS — E7800 Pure hypercholesterolemia, unspecified: Secondary | ICD-10-CM | POA: Diagnosis not present

## 2020-06-14 DIAGNOSIS — M19011 Primary osteoarthritis, right shoulder: Secondary | ICD-10-CM | POA: Diagnosis not present

## 2020-06-14 DIAGNOSIS — M109 Gout, unspecified: Secondary | ICD-10-CM | POA: Diagnosis not present

## 2020-06-14 DIAGNOSIS — M170 Bilateral primary osteoarthritis of knee: Secondary | ICD-10-CM | POA: Diagnosis not present

## 2020-06-14 DIAGNOSIS — R600 Localized edema: Secondary | ICD-10-CM | POA: Diagnosis not present

## 2020-06-14 NOTE — Telephone Encounter (Signed)
Call placed - pt informed.

## 2020-06-14 NOTE — Telephone Encounter (Signed)
Please advise.  Copied from [place] [phone]. Topic: General - Other >> Jun 13, 2020 12:21 PM Rainey Pines A wrote: Hillard Danker would like a callback from nurse in regards to patients medication he was prescribed yesterday. Patient sent girlfirend to pic kup medication from pharmacy this morning and pharmacy stated they would not release medication. Please advise  Best contact [phone]

## 2020-06-14 NOTE — Telephone Encounter (Signed)
Ciprodex is on order for tomorrow 06/15/2020 after 12pm and Debrox is OTC.

## 2020-06-19 DIAGNOSIS — E1140 Type 2 diabetes mellitus with diabetic neuropathy, unspecified: Secondary | ICD-10-CM | POA: Diagnosis not present

## 2020-06-19 DIAGNOSIS — E7800 Pure hypercholesterolemia, unspecified: Secondary | ICD-10-CM | POA: Diagnosis not present

## 2020-06-19 DIAGNOSIS — M109 Gout, unspecified: Secondary | ICD-10-CM | POA: Diagnosis not present

## 2020-06-19 DIAGNOSIS — M170 Bilateral primary osteoarthritis of knee: Secondary | ICD-10-CM | POA: Diagnosis not present

## 2020-06-19 DIAGNOSIS — F70 Mild intellectual disabilities: Secondary | ICD-10-CM | POA: Diagnosis not present

## 2020-06-19 DIAGNOSIS — M19011 Primary osteoarthritis, right shoulder: Secondary | ICD-10-CM | POA: Diagnosis not present

## 2020-06-19 DIAGNOSIS — Z794 Long term (current) use of insulin: Secondary | ICD-10-CM | POA: Diagnosis not present

## 2020-06-19 DIAGNOSIS — R600 Localized edema: Secondary | ICD-10-CM | POA: Diagnosis not present

## 2020-06-21 DIAGNOSIS — Z794 Long term (current) use of insulin: Secondary | ICD-10-CM | POA: Diagnosis not present

## 2020-06-21 DIAGNOSIS — M170 Bilateral primary osteoarthritis of knee: Secondary | ICD-10-CM | POA: Diagnosis not present

## 2020-06-21 DIAGNOSIS — M19011 Primary osteoarthritis, right shoulder: Secondary | ICD-10-CM | POA: Diagnosis not present

## 2020-06-21 DIAGNOSIS — E7800 Pure hypercholesterolemia, unspecified: Secondary | ICD-10-CM | POA: Diagnosis not present

## 2020-06-21 DIAGNOSIS — F70 Mild intellectual disabilities: Secondary | ICD-10-CM | POA: Diagnosis not present

## 2020-06-21 DIAGNOSIS — R600 Localized edema: Secondary | ICD-10-CM | POA: Diagnosis not present

## 2020-06-21 DIAGNOSIS — E1140 Type 2 diabetes mellitus with diabetic neuropathy, unspecified: Secondary | ICD-10-CM | POA: Diagnosis not present

## 2020-06-21 DIAGNOSIS — M109 Gout, unspecified: Secondary | ICD-10-CM | POA: Diagnosis not present

## 2020-06-24 DIAGNOSIS — M25519 Pain in unspecified shoulder: Secondary | ICD-10-CM | POA: Diagnosis not present

## 2020-06-27 ENCOUNTER — Telehealth: Payer: Self-pay | Admitting: Family Medicine

## 2020-06-27 MED ORDER — ALLOPURINOL 300 MG PO TABS: 300.0000 mg | ORAL_TABLET | Freq: Every day | ORAL | 6 refills | Status: DC

## 2020-06-27 NOTE — Telephone Encounter (Signed)
colchicine tablet 0.6 mg    Pt's sister called back to report that the wrong medication was submitted please advise, pt is requesting colchicine even though it is not part of the current med list

## 2020-06-27 NOTE — Telephone Encounter (Signed)
Medication Refill - Medication: colchicine   Has the patient contacted their pharmacy? Yes.   (Agent: If no, request that the patient contact the pharmacy for the refill.) (Agent: If yes, when and what did the pharmacy advise?)  Preferred Pharmacy (with phone number or street name):  [id number] - [place], [place]  [address] [place] [address]  Phone: [phone] Fax: [phone]  Hours: Open 24 hours     Agent: Please be advised that RX refills may take up to 3 business days. We ask that you follow-up with your pharmacy.

## 2020-06-28 ENCOUNTER — Emergency Department (HOSPITAL_COMMUNITY): Payer: Medicare HMO

## 2020-06-28 ENCOUNTER — Inpatient Hospital Stay (HOSPITAL_COMMUNITY)
Admission: EM | Admit: 2020-06-28 | Discharge: 2020-07-11 | DRG: 682 | Disposition: A | Discharge status: Skilled Nursing Facility | Payer: Medicare HMO | Attending: Internal Medicine | Admitting: Internal Medicine

## 2020-06-28 ENCOUNTER — Inpatient Hospital Stay (HOSPITAL_COMMUNITY): Payer: Medicare HMO

## 2020-06-28 ENCOUNTER — Other Ambulatory Visit: Payer: Self-pay

## 2020-06-28 ENCOUNTER — Encounter (HOSPITAL_COMMUNITY): Payer: Self-pay

## 2020-06-28 DIAGNOSIS — E1165 Type 2 diabetes mellitus with hyperglycemia: Secondary | ICD-10-CM | POA: Diagnosis present

## 2020-06-28 DIAGNOSIS — Z7401 Bed confinement status: Secondary | ICD-10-CM | POA: Diagnosis not present

## 2020-06-28 DIAGNOSIS — K921 Melena: Secondary | ICD-10-CM | POA: Diagnosis not present

## 2020-06-28 DIAGNOSIS — I517 Cardiomegaly: Secondary | ICD-10-CM | POA: Diagnosis not present

## 2020-06-28 DIAGNOSIS — Z794 Long term (current) use of insulin: Secondary | ICD-10-CM | POA: Diagnosis not present

## 2020-06-28 DIAGNOSIS — E785 Hyperlipidemia, unspecified: Secondary | ICD-10-CM | POA: Diagnosis present

## 2020-06-28 DIAGNOSIS — M1990 Unspecified osteoarthritis, unspecified site: Secondary | ICD-10-CM | POA: Diagnosis present

## 2020-06-28 DIAGNOSIS — M10032 Idiopathic gout, left wrist: Secondary | ICD-10-CM | POA: Diagnosis present

## 2020-06-28 DIAGNOSIS — R600 Localized edema: Secondary | ICD-10-CM | POA: Diagnosis not present

## 2020-06-28 DIAGNOSIS — R079 Chest pain, unspecified: Secondary | ICD-10-CM

## 2020-06-28 DIAGNOSIS — I959 Hypotension, unspecified: Secondary | ICD-10-CM | POA: Diagnosis present

## 2020-06-28 DIAGNOSIS — E11649 Type 2 diabetes mellitus with hypoglycemia without coma: Secondary | ICD-10-CM | POA: Diagnosis not present

## 2020-06-28 DIAGNOSIS — Z833 Family history of diabetes mellitus: Secondary | ICD-10-CM

## 2020-06-28 DIAGNOSIS — E1140 Type 2 diabetes mellitus with diabetic neuropathy, unspecified: Secondary | ICD-10-CM | POA: Diagnosis present

## 2020-06-28 DIAGNOSIS — D631 Anemia in chronic kidney disease: Secondary | ICD-10-CM | POA: Diagnosis present

## 2020-06-28 DIAGNOSIS — E7800 Pure hypercholesterolemia, unspecified: Secondary | ICD-10-CM | POA: Diagnosis not present

## 2020-06-28 DIAGNOSIS — M25522 Pain in left elbow: Secondary | ICD-10-CM | POA: Diagnosis not present

## 2020-06-28 DIAGNOSIS — M2550 Pain in unspecified joint: Secondary | ICD-10-CM | POA: Diagnosis not present

## 2020-06-28 DIAGNOSIS — N183 Chronic kidney disease, stage 3 unspecified: Secondary | ICD-10-CM | POA: Diagnosis not present

## 2020-06-28 DIAGNOSIS — M7989 Other specified soft tissue disorders: Secondary | ICD-10-CM | POA: Diagnosis not present

## 2020-06-28 DIAGNOSIS — M6282 Rhabdomyolysis: Secondary | ICD-10-CM | POA: Diagnosis present

## 2020-06-28 DIAGNOSIS — Z23 Encounter for immunization: Secondary | ICD-10-CM

## 2020-06-28 DIAGNOSIS — I129 Hypertensive chronic kidney disease with stage 1 through stage 4 chronic kidney disease, or unspecified chronic kidney disease: Secondary | ICD-10-CM | POA: Diagnosis not present

## 2020-06-28 DIAGNOSIS — G92 Toxic encephalopathy: Secondary | ICD-10-CM | POA: Diagnosis present

## 2020-06-28 DIAGNOSIS — N170 Acute kidney failure with tubular necrosis: Secondary | ICD-10-CM | POA: Diagnosis not present

## 2020-06-28 DIAGNOSIS — E861 Hypovolemia: Secondary | ICD-10-CM | POA: Diagnosis present

## 2020-06-28 DIAGNOSIS — M19012 Primary osteoarthritis, left shoulder: Secondary | ICD-10-CM | POA: Diagnosis not present

## 2020-06-28 DIAGNOSIS — R4189 Other symptoms and signs involving cognitive functions and awareness: Secondary | ICD-10-CM | POA: Diagnosis not present

## 2020-06-28 DIAGNOSIS — R195 Other fecal abnormalities: Secondary | ICD-10-CM | POA: Diagnosis present

## 2020-06-28 DIAGNOSIS — N179 Acute kidney failure, unspecified: Secondary | ICD-10-CM | POA: Diagnosis not present

## 2020-06-28 DIAGNOSIS — M79642 Pain in left hand: Secondary | ICD-10-CM | POA: Diagnosis not present

## 2020-06-28 DIAGNOSIS — M10022 Idiopathic gout, left elbow: Secondary | ICD-10-CM | POA: Diagnosis not present

## 2020-06-28 DIAGNOSIS — Z20822 Contact with and (suspected) exposure to covid-19: Secondary | ICD-10-CM | POA: Diagnosis present

## 2020-06-28 DIAGNOSIS — E871 Hypo-osmolality and hyponatremia: Secondary | ICD-10-CM | POA: Diagnosis not present

## 2020-06-28 DIAGNOSIS — I82722 Chronic embolism and thrombosis of deep veins of left upper extremity: Secondary | ICD-10-CM | POA: Diagnosis present

## 2020-06-28 DIAGNOSIS — N182 Chronic kidney disease, stage 2 (mild): Secondary | ICD-10-CM | POA: Diagnosis present

## 2020-06-28 DIAGNOSIS — M25532 Pain in left wrist: Secondary | ICD-10-CM | POA: Diagnosis not present

## 2020-06-28 DIAGNOSIS — E860 Dehydration: Secondary | ICD-10-CM | POA: Diagnosis present

## 2020-06-28 DIAGNOSIS — M25422 Effusion, left elbow: Secondary | ICD-10-CM | POA: Diagnosis not present

## 2020-06-28 DIAGNOSIS — E1122 Type 2 diabetes mellitus with diabetic chronic kidney disease: Secondary | ICD-10-CM | POA: Diagnosis present

## 2020-06-28 DIAGNOSIS — D509 Iron deficiency anemia, unspecified: Secondary | ICD-10-CM | POA: Diagnosis present

## 2020-06-28 DIAGNOSIS — Z6841 Body Mass Index (BMI) 40.0 and over, adult: Secondary | ICD-10-CM | POA: Diagnosis not present

## 2020-06-28 DIAGNOSIS — F70 Mild intellectual disabilities: Secondary | ICD-10-CM | POA: Diagnosis not present

## 2020-06-28 DIAGNOSIS — R531 Weakness: Secondary | ICD-10-CM | POA: Diagnosis not present

## 2020-06-28 DIAGNOSIS — R809 Proteinuria, unspecified: Secondary | ICD-10-CM | POA: Diagnosis present

## 2020-06-28 DIAGNOSIS — M170 Bilateral primary osteoarthritis of knee: Secondary | ICD-10-CM | POA: Diagnosis not present

## 2020-06-28 DIAGNOSIS — M10041 Idiopathic gout, right hand: Secondary | ICD-10-CM | POA: Diagnosis not present

## 2020-06-28 DIAGNOSIS — R000 Tachycardia, unspecified: Secondary | ICD-10-CM | POA: Diagnosis not present

## 2020-06-28 DIAGNOSIS — J9811 Atelectasis: Secondary | ICD-10-CM | POA: Diagnosis not present

## 2020-06-28 DIAGNOSIS — E875 Hyperkalemia: Secondary | ICD-10-CM | POA: Diagnosis not present

## 2020-06-28 DIAGNOSIS — D649 Anemia, unspecified: Secondary | ICD-10-CM | POA: Diagnosis not present

## 2020-06-28 DIAGNOSIS — E872 Acidosis: Secondary | ICD-10-CM | POA: Diagnosis present

## 2020-06-28 DIAGNOSIS — Z79899 Other long term (current) drug therapy: Secondary | ICD-10-CM

## 2020-06-28 DIAGNOSIS — E1129 Type 2 diabetes mellitus with other diabetic kidney complication: Secondary | ICD-10-CM | POA: Diagnosis not present

## 2020-06-28 DIAGNOSIS — R41841 Cognitive communication deficit: Secondary | ICD-10-CM | POA: Diagnosis not present

## 2020-06-28 DIAGNOSIS — Z7982 Long term (current) use of aspirin: Secondary | ICD-10-CM

## 2020-06-28 DIAGNOSIS — R0902 Hypoxemia: Secondary | ICD-10-CM | POA: Diagnosis not present

## 2020-06-28 DIAGNOSIS — R040 Epistaxis: Secondary | ICD-10-CM | POA: Diagnosis not present

## 2020-06-28 DIAGNOSIS — M6281 Muscle weakness (generalized): Secondary | ICD-10-CM | POA: Diagnosis not present

## 2020-06-28 DIAGNOSIS — M19011 Primary osteoarthritis, right shoulder: Secondary | ICD-10-CM | POA: Diagnosis not present

## 2020-06-28 DIAGNOSIS — I10 Essential (primary) hypertension: Secondary | ICD-10-CM | POA: Diagnosis not present

## 2020-06-28 DIAGNOSIS — R52 Pain, unspecified: Secondary | ICD-10-CM | POA: Diagnosis not present

## 2020-06-28 DIAGNOSIS — N1831 Chronic kidney disease, stage 3a: Secondary | ICD-10-CM | POA: Diagnosis not present

## 2020-06-28 DIAGNOSIS — R509 Fever, unspecified: Secondary | ICD-10-CM | POA: Diagnosis not present

## 2020-06-28 DIAGNOSIS — Z7952 Long term (current) use of systemic steroids: Secondary | ICD-10-CM

## 2020-06-28 DIAGNOSIS — R29818 Other symptoms and signs involving the nervous system: Secondary | ICD-10-CM | POA: Diagnosis not present

## 2020-06-28 DIAGNOSIS — M109 Gout, unspecified: Secondary | ICD-10-CM | POA: Diagnosis not present

## 2020-06-28 LAB — CBC WITH DIFFERENTIAL/PLATELET
Abs Immature Granulocytes: 0.07 10*3/uL (ref 0.00–0.07)
Basophils Absolute: 0 10*3/uL (ref 0.0–0.1)
Basophils Relative: 0 %
Eosinophils Absolute: 0 10*3/uL (ref 0.0–0.5)
Eosinophils Relative: 0 %
HCT: 31.3 % — ABNORMAL LOW (ref 39.0–52.0)
Hemoglobin: 10.1 g/dL — ABNORMAL LOW (ref 13.0–17.0)
Immature Granulocytes: 1 %
Lymphocytes Relative: 7 %
Lymphs Abs: 1.1 10*3/uL (ref 0.7–4.0)
MCH: 24.3 pg — ABNORMAL LOW (ref 26.0–34.0)
MCHC: 32.3 g/dL (ref 30.0–36.0)
MCV: 75.2 fL — ABNORMAL LOW (ref 80.0–100.0)
Monocytes Absolute: 1.1 10*3/uL — ABNORMAL HIGH (ref 0.1–1.0)
Monocytes Relative: 8 %
Neutro Abs: 11.9 10*3/uL — ABNORMAL HIGH (ref 1.7–7.7)
Neutrophils Relative %: 84 %
Platelets: 183 10*3/uL (ref 150–400)
RBC: 4.16 MIL/uL — ABNORMAL LOW (ref 4.22–5.81)
RDW: 18 % — ABNORMAL HIGH (ref 11.5–15.5)
WBC: 14.2 10*3/uL — ABNORMAL HIGH (ref 4.0–10.5)
nRBC: 0 % (ref 0.0–0.2)

## 2020-06-28 LAB — BASIC METABOLIC PANEL
Anion gap: 23 — ABNORMAL HIGH (ref 5–15)
Anion gap: 20 — ABNORMAL HIGH (ref 5–15)
BUN: 132 mg/dL — ABNORMAL HIGH (ref 6–20)
BUN: 133 mg/dL — ABNORMAL HIGH (ref 6–20)
CO2: 22 mmol/L (ref 22–32)
CO2: 24 mmol/L (ref 22–32)
Calcium: 9 mg/dL (ref 8.9–10.3)
Calcium: 8.9 mg/dL (ref 8.9–10.3)
Chloride: 86 mmol/L — ABNORMAL LOW (ref 98–111)
Chloride: 88 mmol/L — ABNORMAL LOW (ref 98–111)
Creatinine, Ser: 6.68 mg/dL — ABNORMAL HIGH (ref 0.61–1.24)
Creatinine, Ser: 6.38 mg/dL — ABNORMAL HIGH (ref 0.61–1.24)
GFR calc Af Amer: 10 mL/min — ABNORMAL LOW (ref >60)
GFR calc Af Amer: 10 mL/min — ABNORMAL LOW (ref >60)
GFR calc non Af Amer: 8 mL/min — ABNORMAL LOW (ref >60)
GFR calc non Af Amer: 9 mL/min — ABNORMAL LOW (ref >60)
Glucose, Bld: 241 mg/dL — ABNORMAL HIGH (ref 70–99)
Glucose, Bld: 222 mg/dL — ABNORMAL HIGH (ref 70–99)
Potassium: 6.2 mmol/L — ABNORMAL HIGH (ref 3.5–5.1)
Potassium: 3.7 mmol/L (ref 3.5–5.1)
Sodium: 131 mmol/L — ABNORMAL LOW (ref 135–145)
Sodium: 132 mmol/L — ABNORMAL LOW (ref 135–145)

## 2020-06-28 LAB — URINALYSIS, ROUTINE W REFLEX MICROSCOPIC
Bilirubin Urine: NEGATIVE
Glucose, UA: NEGATIVE mg/dL
Ketones, ur: NEGATIVE mg/dL
Leukocytes,Ua: NEGATIVE
Nitrite: NEGATIVE
Protein, ur: NEGATIVE mg/dL
Specific Gravity, Urine: 1.013 (ref 1.005–1.030)
pH: 5 (ref 5.0–8.0)

## 2020-06-28 LAB — I-STAT VENOUS BLOOD GAS, ED
Acid-Base Excess: 2 mmol/L (ref 0.0–2.0)
Bicarbonate: 27.4 mmol/L (ref 20.0–28.0)
Calcium, Ion: 0.91 mmol/L — ABNORMAL LOW (ref 1.15–1.40)
HCT: 34 % — ABNORMAL LOW (ref 39.0–52.0)
Hemoglobin: 11.6 g/dL — ABNORMAL LOW (ref 13.0–17.0)
O2 Saturation: 99 %
Potassium: 5.7 mmol/L — ABNORMAL HIGH (ref 3.5–5.1)
Sodium: 129 mmol/L — ABNORMAL LOW (ref 135–145)
TCO2: 29 mmol/L (ref 22–32)
pCO2, Ven: 44.2 mmHg (ref 44.0–60.0)
pH, Ven: 7.4 (ref 7.250–7.430)
pO2, Ven: 124 mmHg — ABNORMAL HIGH (ref 32.0–45.0)

## 2020-06-28 LAB — CK: Total CK: 8128 U/L — ABNORMAL HIGH (ref 49–397)

## 2020-06-28 LAB — URIC ACID: Uric Acid, Serum: 9.3 mg/dL — ABNORMAL HIGH (ref 3.7–8.6)

## 2020-06-28 LAB — POC OCCULT BLOOD, ED: Fecal Occult Bld: POSITIVE — AB

## 2020-06-28 LAB — NA AND K (SODIUM & POTASSIUM), RAND UR
Potassium Urine: 42 mmol/L
Sodium, Ur: 42 mmol/L

## 2020-06-28 LAB — GLUCOSE, CAPILLARY: Glucose-Capillary: 194 mg/dL — ABNORMAL HIGH (ref 70–99)

## 2020-06-28 LAB — SARS CORONAVIRUS 2 BY RT PCR (HOSPITAL ORDER, PERFORMED IN ~~LOC~~ HOSPITAL LAB): SARS Coronavirus 2: NEGATIVE

## 2020-06-28 LAB — OSMOLALITY, URINE: Osmolality, Ur: 379 mOsm/kg (ref 300–900)

## 2020-06-28 MED ORDER — INSULIN ASPART 100 UNIT/ML IV SOLN: 5.0000 [IU] | Freq: Once | INTRAVENOUS | Status: DC

## 2020-06-28 MED ORDER — SODIUM CHLORIDE 0.9 % IV BOLUS
500.0000 mL | Freq: Once | INTRAVENOUS | Status: AC
  Administered 2020-06-28: 500 mL via INTRAVENOUS

## 2020-06-28 MED ORDER — METHYLPREDNISOLONE SODIUM SUCC 40 MG IJ SOLR
40.0000 mg | Freq: Two times a day (BID) | INTRAMUSCULAR | Status: DC
  Administered 2020-06-28 – 2020-06-29 (×3): 40 mg via INTRAVENOUS
  Filled 2020-06-28 (×4): qty 1

## 2020-06-28 MED ORDER — ASPIRIN EC 81 MG PO TBEC
81.0000 mg | DELAYED_RELEASE_TABLET | Freq: Every day | ORAL | Status: DC
  Administered 2020-06-29 – 2020-07-11 (×13): 81 mg via ORAL
  Filled 2020-06-28 (×13): qty 1

## 2020-06-28 MED ORDER — COLCHICINE 0.6 MG PO TABS: 1.2000 mg | ORAL_TABLET | Freq: Once | ORAL | Status: DC

## 2020-06-28 MED ORDER — CALCIUM GLUCONATE-NACL 1-0.675 GM/50ML-% IV SOLN
1.0000 g | Freq: Once | INTRAVENOUS | Status: AC
  Administered 2020-06-28: 1000 mg via INTRAVENOUS
  Filled 2020-06-28: qty 50

## 2020-06-28 MED ORDER — INSULIN ASPART 100 UNIT/ML ~~LOC~~ SOLN
0.0000 [IU] | Freq: Every day | SUBCUTANEOUS | Status: DC
  Administered 2020-06-29: 5 [IU] via SUBCUTANEOUS

## 2020-06-28 MED ORDER — TRAZODONE HCL 50 MG PO TABS
50.0000 mg | ORAL_TABLET | Freq: Every day | ORAL | Status: DC
  Administered 2020-06-28 – 2020-07-10 (×13): 50 mg via ORAL
  Filled 2020-06-28 (×13): qty 1

## 2020-06-28 MED ORDER — INSULIN ASPART 100 UNIT/ML ~~LOC~~ SOLN
0.0000 [IU] | Freq: Three times a day (TID) | SUBCUTANEOUS | Status: DC
  Administered 2020-06-29: 7 [IU] via SUBCUTANEOUS
  Administered 2020-06-29: 5 [IU] via SUBCUTANEOUS
  Administered 2020-06-29: 7 [IU] via SUBCUTANEOUS

## 2020-06-28 MED ORDER — FERROUS SULFATE 325 (65 FE) MG PO TABS
325.0000 mg | ORAL_TABLET | Freq: Every day | ORAL | Status: DC
  Administered 2020-06-29 – 2020-07-11 (×13): 325 mg via ORAL
  Filled 2020-06-28 (×13): qty 1

## 2020-06-28 MED ORDER — SODIUM ZIRCONIUM CYCLOSILICATE 10 G PO PACK
10.0000 g | PACK | Freq: Once | ORAL | Status: AC
  Administered 2020-06-28: 10 g via ORAL
  Filled 2020-06-28: qty 1

## 2020-06-28 MED ORDER — HYDROMORPHONE HCL 1 MG/ML IJ SOLN
0.5000 mg | INTRAMUSCULAR | Status: DC | PRN
  Administered 2020-07-05: 1 mg via INTRAVENOUS
  Filled 2020-06-28: qty 1

## 2020-06-28 MED ORDER — PANTOPRAZOLE SODIUM 40 MG IV SOLR
40.0000 mg | INTRAVENOUS | Status: DC
  Administered 2020-06-28: 40 mg via INTRAVENOUS
  Filled 2020-06-28: qty 40

## 2020-06-28 MED ORDER — SODIUM CHLORIDE 0.9 % IV SOLN: Freq: Once | INTRAVENOUS | Status: AC

## 2020-06-28 MED ORDER — FENTANYL CITRATE (PF) 100 MCG/2ML IJ SOLN
50.0000 ug | Freq: Once | INTRAMUSCULAR | Status: AC
  Administered 2020-06-28: 50 ug via INTRAVENOUS
  Filled 2020-06-28: qty 2

## 2020-06-28 MED ORDER — LOKELMA 10 G PO PACK: 10.0000 g | PACK | Freq: Once | ORAL | 0 refills | Status: DC

## 2020-06-28 MED ORDER — HYDRALAZINE HCL 20 MG/ML IJ SOLN: 5.0000 mg | INTRAMUSCULAR | Status: DC | PRN

## 2020-06-28 MED ORDER — ACETAMINOPHEN 325 MG PO TABS
650.0000 mg | ORAL_TABLET | Freq: Four times a day (QID) | ORAL | Status: DC | PRN
  Administered 2020-06-28 – 2020-07-02 (×3): 650 mg via ORAL
  Filled 2020-06-28 (×3): qty 2

## 2020-06-28 MED ORDER — SODIUM CHLORIDE 0.9 % IV BOLUS
250.0000 mL | Freq: Once | INTRAVENOUS | Status: AC
  Administered 2020-06-29: 250 mL via INTRAVENOUS

## 2020-06-28 MED ORDER — LORATADINE 10 MG PO TABS
10.0000 mg | ORAL_TABLET | Freq: Every day | ORAL | Status: DC
  Administered 2020-06-29 – 2020-07-11 (×13): 10 mg via ORAL
  Filled 2020-06-28 (×13): qty 1

## 2020-06-28 MED ORDER — ATORVASTATIN CALCIUM 40 MG PO TABS: 40.0000 mg | ORAL_TABLET | Freq: Every day | ORAL | Status: DC

## 2020-06-28 MED ORDER — ACETAMINOPHEN 650 MG RE SUPP: 650.0000 mg | Freq: Four times a day (QID) | RECTAL | Status: DC | PRN

## 2020-06-28 MED ORDER — HEPARIN SODIUM (PORCINE) 5000 UNIT/ML IJ SOLN
5000.0000 [IU] | Freq: Two times a day (BID) | INTRAMUSCULAR | Status: DC
  Administered 2020-06-28 – 2020-07-03 (×10): 5000 [IU] via SUBCUTANEOUS
  Filled 2020-06-28 (×10): qty 1

## 2020-06-28 NOTE — Consult Note (Signed)
Nephrology Consult  [facility]  Requesting provider: Lennice Sites, DO Reason for consult: AKI   Assessment/Recommendations: [name] is a/an 59 y.o. male with a past medical history of gout, CKD3, HTN, DM2, obesity, HLD, OA who presents with pain and weakness and labs were notable for AKI    AKI on CKD3: Likely secondary to prolonged prerenal injury with possible rhabdomyolysis. Very well might have acute urate nephropathy as well given previous uric acid levels -underlying CKD best explained by possible diabetic nephropathy given history of albuminuria. Baseline Cr around 1.7? -check CK level given prolonged immobility -check uric acid levels -renal ultrasound, rule out obstruction -hold home ACEI/HCTZ as well as home lasix -isotonic fluids would bolus 1L and if volume status is accepatable would bolus another 1L followed by mIVF (can start with 125cc/hr). Clinically dry on exam -if no improvement in renal function may very well require dialysis temporarily especially if mentation/BUN does not improve -send urinalysis for sediment analysis -Chart reviewed: on lisinopril/hctz, lasix. Unsure if he is on NSAIDs or not given gout -Continue to monitor daily Cr, Dose meds for GFR<15 -Monitor Daily I/Os, Daily weight  -Maintain MAP>65 for optimal renal perfusion.  -Agree with holding ACE-I, avoid further nephrotoxins including NSAIDS, Morphine.  Unless absolutely necessary, avoid CT with contrast and/or MRI with gadolinium.     Gout, history of hyperuricemia -consider prednisone -renally dosed allopurinol  -no colchicine, would not restart him on thiazides  Hypotension, h/o HTN -hold home anti-HTN's, fluids as above  Hyperkalemia -initial sample hemolyzed. Repeat K 5.7. Agree with one dose of lokelma. Monitor serial K's  Hypocalcemia -secondary to rhabdo? CK level pending -isotonic fluids as above. Can given 1g calcium gluconate (caution with repletion if truly  rhabdo given possible rebound effects in resolving rhabdo). Monitor serial ionized calciums  Hypovolemic Hyponatremia -normal saline for now, if repeat Na drops further then would need to hold off further fluids -urine osm, serum osm, urine K+Na  Fecal occult positive -trend hgb, consider GI consult if further drop in hgb  Microcytic anemia -check iron panel -Transfuse for Hgb<7 g/dL -No role for ESA in setting of AKI  Diabetes Mellitus 2 -management per primary service  Recommendations conveyed to primary service.    [facility] 06/28/2020 6:53 PM   ____________________________________________________________________________  History of Present Illness: [name] is a 59 y.o. male with a past medical history of DM2, HTN, neuropathy, HLD obesity, OA, gout, CKD3 who presents to the ER with multiple joint pains and change in mentation. Has not been walking around for around 5 days and had not been eating or drinking for about 3 days due to nausea and loss of appetite. Doing PT at home for his OA.  Previously had worsening swelling of his legs therefore his PCP increase his Lasix from 40 to 80 mg daily after changing his kidney function however his allopurinol colchicine were continued the same dose.  Has been on prednisone, allopurinol, Mobic, colchicine, indomethacin in the past for gout.  He was evaluated by his home physical therapist today and it was found that he was unable to stand up or walk therefore he was recommended to come to the ER. Patient currently reports pain in his left elbow extending down to his wrist. Denies chest pain, SOB, nausea currently. Reports that he has not had a difficult time voiding.    Medications:  Current Facility-Administered Medications  Medication Dose Route Frequency Provider Last Rate Last Admin  .  0.9 %  sodium chloride infusion   Intravenous Once Lennice Sites, DO       Current Outpatient Medications   Medication Sig Dispense Refill  . Accu-Chek FastClix Lancets MISC 1 each by Does not apply route 3 (three) times daily. as directed 102 each 12  . Alcohol Swabs (B-D SINGLE USE SWABS REGULAR) PADS Please use as instructed before checking blood sugar and before injecting insulin. 100 each 11  . allopurinol (ZYLOPRIM) 300 MG tablet Take 1 tablet (300 mg total) by mouth daily. 30 tablet 6  . aspirin 81 MG EC tablet Take 1 tablet (81 mg total) by mouth daily. 90 tablet 1  . atorvastatin (LIPITOR) 40 MG tablet Take 1 tablet (40 mg total) by mouth daily at 6 PM. 90 tablet 1  . benzonatate (TESSALON) 100 MG capsule Take 200 mg by mouth 2 (two) times daily as needed for cough.    . Blood Glucose Calibration (ACCU-CHEK AVIVA) SOLN Use to calibrate your glucometer when needed. 1 each 0  . Blood Glucose Monitoring Suppl (ACCU-CHEK AVIVA PLUS) w/Device KIT USE AS INSTRUCTED THREE TIMES DAILY. E11.69 1 kit 0  . carbamide peroxide (DEBROX) 6.5 % OTIC solution Place 5 drops into the right ear 2 (two) times daily. 15 mL 0  . cetirizine (ZYRTEC) 10 MG tablet TAKE 1 TABLET (10 MG TOTAL) BY MOUTH DAILY. 30 tablet 3  . ciprofloxacin-dexamethasone (CIPRODEX) OTIC suspension Place 4 drops into the right ear 2 (two) times daily. 7.5 mL 0  . diclofenac sodium (VOLTAREN) 1 % GEL Apply 2 g topically 4 (four) times daily. 100 g 3  . DROPLET PEN NEEDLES 32G X 4 MM MISC USE AS DIRECTED DAILY TO ADMINISTER VICTOZA 100 each 6  . FEROSUL 325 (65 Fe) MG tablet TAKE 1 TABLET BY MOUTH DAILY WITH BREAKFAST. 30 tablet 2  . furosemide (LASIX) 40 MG tablet Take 1 tablet (40 mg total) by mouth daily. 90day supply 90 tablet 1  . gabapentin (NEURONTIN) 300 MG capsule Take 1 capsule (300 mg total) by mouth 2 (two) times daily. 180 capsule 1  . glucose blood (ACCU-CHEK AVIVA PLUS) test strip USE AS INSTRUCTED THREE TIMES DAILY. E11.69 100 each 12  . HYDROcodone-acetaminophen (NORCO/VICODIN) 5-325 MG tablet Take 1 tablet by mouth every 6  (six) hours as needed for severe pain. 5 tablet 0  . Insulin Syringe-Needle U-100 (TRUEPLUS INSULIN SYRINGE) 30G X 5/16" 1 ML MISC USE TWICE DAILY 100 each 12  . liraglutide (VICTOZA) 18 MG/3ML SOPN Inject 0.2 mLs (1.2 mg total) into the skin every morning. 18 mL 3  . lisinopril-hydrochlorothiazide (ZESTORETIC) 20-12.5 MG tablet Take 1 tablet by mouth daily. 90 tablet 1  . metFORMIN (GLUCOPHAGE) 500 MG tablet Take 2 tablets (1,000 mg total) by mouth 2 (two) times daily with a meal. 360 tablet 1  . metoprolol succinate (TOPROL-XL) 50 MG 24 hr tablet Take 1 tablet (50 mg total) by mouth daily. 90 tablet 1  . MITIGARE 0.6 MG CAPS Take 2 tabs (1.2 mg) at the onset of a gout flare.  May repeat in 1 hour if symptoms persist. 30 capsule 2  . NOVOLOG MIX 70/30 (70-30) 100 UNIT/ML injection INJECT 48 UNITS SUBCUTANEOUSLY TWICE DAILY WITH A MEAL (DOSE CHANGE) 90 mL 0  . predniSONE (DELTASONE) 10 MG tablet 6,5,4,3,2,1 take each days dose all at once with food 21 tablet 0  . sodium zirconium cyclosilicate (LOKELMA) 10 g PACK packet Take 10 g by mouth once for 1 dose. 1  packet 0  . traZODone (DESYREL) 50 MG tablet Take 1 tablet (50 mg total) by mouth at bedtime. 30 tablet 3     ALLERGIES Patient has no known allergies.  MEDICAL HISTORY Past Medical History:  Diagnosis Date  . Gouty bursitis, elbow  10/18/2015  . Hx of gout   . Hyperlipidemia   . Hypertension   . Knee pain, chronic   . Type II diabetes mellitus ([place])      SOCIAL HISTORY Social History   Socioeconomic History  . Marital status: Single    Spouse name: Not on file  . Number of children: Not on file  . Years of education: Not on file  . Highest education level: Not on file  Occupational History  . Not on file  Tobacco Use  . Smoking status: Never Smoker  . Smokeless tobacco: Never Used  Vaping Use  . Vaping Use: Never used  Substance and Sexual Activity  . Alcohol use: No  . Drug use: No  . Sexual activity: Not Currently   Other Topics Concern  . Not on file  Social History Narrative  . Not on file   Social Determinants of Health   Financial Resource Strain:   . Difficulty of Paying Living Expenses:   Food Insecurity:   . Worried About Charity fundraiser in the Last Year:   . Arboriculturist in the Last Year:   Transportation Needs:   . Film/video editor (Medical):   Marland Kitchen Lack of Transportation (Non-Medical):   Physical Activity:   . Days of Exercise per Week:   . Minutes of Exercise per Session:   Stress:   . Feeling of Stress :   Social Connections:   . Frequency of Communication with Friends and Family:   . Frequency of Social Gatherings with Friends and Family:   . Attends Religious Services:   . Active Member of Clubs or Organizations:   . Attends Archivist Meetings:   Marland Kitchen Marital Status:   Intimate Partner Violence:   . Fear of Current or Ex-Partner:   . Emotionally Abused:   Marland Kitchen Physically Abused:   . Sexually Abused:      FAMILY HISTORY Family History  Problem Relation Age of Onset  . Diabetes Mother   . Diabetes Father      Review of Systems: 12 systems reviewed Otherwise as per HPI, all other systems reviewed and negative  Physical Exam: Vitals:   06/28/20 1815 06/28/20 1830  BP: 99/63 118/68  Pulse: 97 96  Resp: 20 (!) 24  Temp:    SpO2: 99% 98%   Total I/O In: 500 [IV Piggyback:500] Out: -   Intake/Output Summary (Last 24 hours) at 06/28/2020 1853 Last data filed at 06/28/2020 1849 Gross per 24 hour  Intake 500 ml  Output --  Net 500 ml   General: uncomfortable appearing, laying flat in bed HEENT: anicteric sclera, oropharynx clear without lesions, dry mucosal membranes CV: regular rate, normal rhythm, no murmurs, no gallops, no rubs Lungs: clear to auscultation bilaterally, normal work of breathing, bilateral chest expansion Abd: obese, soft, non-tender, non-distended Skin: no visible lesions or rashes Psych: alert, engaged, appropriate mood  and affect Musculoskeletal: diffuse joint swelling, pedal edema Neuro: normal speech, no gross focal deficits, tremulous  Test Results Reviewed Lab Results  Component Value Date   NA 131 (L) 06/28/2020   K 6.2 (H) 06/28/2020   CL 86 (L) 06/28/2020   CO2 22 06/28/2020  BUN 132 (H) 06/28/2020   CREATININE 6.68 (H) 06/28/2020   CALCIUM 9.0 06/28/2020   ALBUMIN 4.7 03/13/2020   PHOS 3.2 12/28/2015     I have reviewed all relevant outside healthcare records related to the patient's kidney injury.

## 2020-06-28 NOTE — ED Triage Notes (Signed)
Pt arrived to ED via EMS from home. Pt c/o generalized weakness w/ pronounced lower extremity weakness. Pt has been unable to get out of his chair x 4 days. Pt reports Jeanett Schlein (friend) lives with him. Home health hadn't heard from pt in about 4 days and called out for a wellness check. Upon EMS arrival pt was found in his chair and was unable to stand. Pt is ambulatory with a walker at baseline.

## 2020-06-28 NOTE — ED Notes (Signed)
Pt transported to [facility]

## 2020-06-28 NOTE — H&P (Signed)
History and Physical    LEAM MADERO JZP:[id number] DOB: 08-Feb-1961 DOA: 06/28/2020  PCP: Charlott Rakes, MD (Confirm with patient/family/NH records and if not entered, this has to be entered at [facility] point of entry) Patient coming from: Home  I have personally briefly reviewed patient's old medical records in [place]  Chief Complaint: Change of mentation  HPI: [name] is a 59 y.o. male with medical history significant of chronic gouty arthritis of multiple joints, CKD stage II, IDDM, hypertension, hyperlipidemia, chronic iron deficiency anemia, presented with worsening of multiple joint pain and change of mentation.  Patient with lethargic, unable to answer my questions and all the history was provided by patient's sister-in-law at bedside.  Patient Gout has been poorly controlled over the years, he went to a new PCP about 1 month ago with worsening of joints pain and swelling of the legs, PCP increased patient Lasix from 40 mg daily to 80 mg daily after checking patient kidney function and continued patient's allopurinol and colchicine and the same dosage.  Patient has been doing well with improvement of swelling and joint pain for about 2 weeks and then started to deteriorate since last week.  According to sister-in-law, that might be from patient running out of colchicine at some point 2 weeks ago, but she is not 100% sure.  In addition, Sister-in-law reported the patient twisted his left shoulder and elbow about 1 week ago while doing physical therapy and thinks and has started to feel constant pain and significant reduction of mobility of the left arm and knees, thus has been unable to stand up and walk last 3 to 4 days, because of increasing pain.  Patient also started to complain about feeling stomach sick and nausea and not able to eat or drink for last 3 days.  Today physical therapy went to patient house and found patient unable to stand up or walk and recommended he come to  ED. ED Course: Sodium 131, potassium 6.2>5.7, creatinine 6.6, BUN 132, bicarb 22, x-ray of left elbow wrist fingers showed extensive soft tissue swelling implying gouty flareup, chest x-ray no significant fluid overload, VBG pH 7.4  Review of Systems: Unable to perform patient lethargic  Past Medical History:  Diagnosis Date  . Gouty bursitis, elbow  10/18/2015  . Hx of gout   . Hyperlipidemia   . Hypertension   . Knee pain, chronic   . Type II diabetes mellitus ([place])     Past Surgical History:  Procedure Laterality Date  . KNEE ARTHROSCOPY Bilateral 12/29/2015   Procedure: ARTHROSCOPY KNEE BILATERAL WITH  PARTIAL MEDIAL AND LATERAL MENISCECTOMY AND FOUR COMPARTMENT SYNOVECTOMY , CHONDRALPLASTIES, PATELLA-FEMORAL CHONDRALPLASTIES BILATERALLY;  Surgeon: Dorna Leitz, MD;  Location: [place];  Service: Orthopedics;  Laterality: Bilateral;  . NO PAST SURGERIES       reports that he has never smoked. He has never used smokeless tobacco. He reports that he does not drink alcohol and does not use drugs.  No Known Allergies  Family History  Problem Relation Age of Onset  . Diabetes Mother   . Diabetes Father      Prior to Admission medications   Medication Sig Start Date End Date Taking? Authorizing Provider  Accu-Chek FastClix Lancets MISC 1 each by Does not apply route 3 (three) times daily. as directed 02/09/20   Charlott Rakes, MD  Alcohol Swabs (B-D SINGLE USE SWABS REGULAR) PADS Please use as instructed before checking blood sugar and before injecting insulin. 02/09/20  Charlott Rakes, MD  allopurinol (ZYLOPRIM) 300 MG tablet Take 1 tablet (300 mg total) by mouth daily. 06/27/20   Charlott Rakes, MD  aspirin 81 MG EC tablet Take 1 tablet (81 mg total) by mouth daily. 12/01/17   Charlott Rakes, MD  atorvastatin (LIPITOR) 40 MG tablet Take 1 tablet (40 mg total) by mouth daily at 6 PM. 03/13/20   Charlott Rakes, MD  benzonatate (TESSALON) 100 MG capsule Take 200 mg by mouth 2 (two)  times daily as needed for cough.    [provider]  Blood Glucose Calibration (ACCU-CHEK AVIVA) SOLN Use to calibrate your glucometer when needed. 02/09/20   Charlott Rakes, MD  Blood Glucose Monitoring Suppl (ACCU-CHEK AVIVA PLUS) w/Device KIT USE AS INSTRUCTED THREE TIMES DAILY. E11.69 02/09/20   Charlott Rakes, MD  carbamide peroxide (DEBROX) 6.5 % OTIC solution Place 5 drops into the right ear 2 (two) times daily. 06/12/20   Charlott Rakes, MD  cetirizine (ZYRTEC) 10 MG tablet TAKE 1 TABLET (10 MG TOTAL) BY MOUTH DAILY. 05/05/18   Charlott Rakes, MD  ciprofloxacin-dexamethasone (CIPRODEX) OTIC suspension Place 4 drops into the right ear 2 (two) times daily. 06/12/20   Charlott Rakes, MD  diclofenac sodium (VOLTAREN) 1 % GEL Apply 2 g topically 4 (four) times daily. 09/29/19   Charlott Rakes, MD  DROPLET PEN NEEDLES 32G X 4 MM MISC USE AS DIRECTED DAILY TO ADMINISTER VICTOZA 05/25/20   Charlott Rakes, MD  FEROSUL 325 (65 Fe) MG tablet TAKE 1 TABLET BY MOUTH DAILY WITH BREAKFAST. 10/06/17   Charlott Rakes, MD  furosemide (LASIX) 40 MG tablet Take 1 tablet (40 mg total) by mouth daily. 90day supply 03/13/20   Charlott Rakes, MD  gabapentin (NEURONTIN) 300 MG capsule Take 1 capsule (300 mg total) by mouth 2 (two) times daily. 03/13/20   Charlott Rakes, MD  glucose blood (ACCU-CHEK AVIVA PLUS) test strip USE AS INSTRUCTED THREE TIMES DAILY. E11.69 02/09/20   Charlott Rakes, MD  HYDROcodone-acetaminophen (NORCO/VICODIN) 5-325 MG tablet Take 1 tablet by mouth every 6 (six) hours as needed for severe pain. 05/29/19   Darlin Drop P, PA-C  Insulin Syringe-Needle U-100 (TRUEPLUS INSULIN SYRINGE) 30G X 5/16" 1 ML MISC USE TWICE DAILY 05/17/19   Charlott Rakes, MD  liraglutide (VICTOZA) 18 MG/3ML SOPN Inject 0.2 mLs (1.2 mg total) into the skin every morning. 03/13/20   Charlott Rakes, MD  lisinopril-hydrochlorothiazide (ZESTORETIC) 20-12.5 MG tablet Take 1 tablet by mouth daily. 03/13/20   Charlott Rakes, MD  metFORMIN (GLUCOPHAGE) 500 MG tablet Take 2 tablets (1,000 mg total) by mouth 2 (two) times daily with a meal. 03/13/20   Charlott Rakes, MD  metoprolol succinate (TOPROL-XL) 50 MG 24 hr tablet Take 1 tablet (50 mg total) by mouth daily. 03/13/20   Charlott Rakes, MD  MITIGARE 0.6 MG CAPS Take 2 tabs (1.2 mg) at the onset of a gout flare.  May repeat in 1 hour if symptoms persist. 06/13/20   Charlott Rakes, MD  NOVOLOG MIX 70/30 (70-30) 100 UNIT/ML injection INJECT 48 UNITS SUBCUTANEOUSLY TWICE DAILY WITH A MEAL (DOSE CHANGE) 05/25/20   Charlott Rakes, MD  predniSONE (DELTASONE) 10 MG tablet 6,5,4,3,2,1 take each days dose all at once with food 03/13/20   Charlott Rakes, MD  sodium zirconium cyclosilicate (LOKELMA) 10 g PACK packet Take 10 g by mouth once for 1 dose. 06/28/20 06/28/20  Curatolo, Adam, DO  traZODone (DESYREL) 50 MG tablet Take 1 tablet (50 mg total) by mouth at bedtime. 06/29/19  Charlott Rakes, MD    Physical Exam: Vitals:   06/28/20 1743 06/28/20 1746 06/28/20 1815 06/28/20 1830  BP:  95/60 99/63 118/68  Pulse:  98 97 96  Resp:  15 20 (!) 24  Temp: 99.5 F (37.5 C)     TempSrc: Rectal     SpO2:  95% 99% 98%  Weight:      Height:        Constitutional: NAD, calm, comfortable Vitals:   06/28/20 1743 06/28/20 1746 06/28/20 1815 06/28/20 1830  BP:  95/60 99/63 118/68  Pulse:  98 97 96  Resp:  15 20 (!) 24  Temp: 99.5 F (37.5 C)     TempSrc: Rectal     SpO2:  95% 99% 98%  Weight:      Height:       Eyes: PERRL, lids and conjunctivae normal ENMT: Mucous membranes are dry. Posterior pharynx clear of any exudate or lesions.Normal dentition.  Neck: normal, supple, no masses, no thyromegaly Respiratory: clear to auscultation bilaterally, no wheezing, no crackles. Normal respiratory effort. No accessory muscle use.  Cardiovascular: Regular rate and rhythm, no murmurs / rubs / gallops. No extremity edema. 2+ pedal pulses. No carotid bruits.  Abdomen: no  tenderness, no masses palpated. No hepatosplenomegaly. Bowel sounds positive.  Musculoskeletal: Swelling on multiple joints including left shoulder, left elbow, left wrist and fingers, palm, bilateral knees, bilateral ankles and several toes, or tenderness to touch, warm to touch Skin: no rashes, lesions, ulcers. No induration Neurologic: Opens eyes, following simple command, no focal deficit Psychiatric: Oriented to himself, lethargic    Labs on Admission: I have personally reviewed following labs and imaging studies  CBC: Recent Labs  Lab 06/28/20 1653 06/28/20 1854  WBC 14.2*  --   NEUTROABS 11.9*  --   HGB 10.1* 11.6*  HCT 31.3* 34.0*  MCV 75.2*  --   PLT 183  --    Basic Metabolic Panel: Recent Labs  Lab 06/28/20 1653 06/28/20 1854  NA 131* 129*  K 6.2* 5.7*  CL 86*  --   CO2 22  --   GLUCOSE 241*  --   BUN 132*  --   CREATININE 6.68*  --   CALCIUM 9.0  --    GFR: Estimated Creatinine Clearance: 13.4 mL/min (A) (by C-G formula based on SCr of 6.68 mg/dL (H)). Liver Function Tests: No results for input(s): AST, ALT, ALKPHOS, BILITOT, PROT, ALBUMIN in the last 168 hours. No results for input(s): LIPASE, AMYLASE in the last 168 hours. No results for input(s): AMMONIA in the last 168 hours. Coagulation Profile: No results for input(s): INR, PROTIME in the last 168 hours. Cardiac Enzymes: No results for input(s): CKTOTAL, CKMB, CKMBINDEX, TROPONINI in the last 168 hours. BNP (last 3 results) No results for input(s): PROBNP in the last 8760 hours. HbA1C: No results for input(s): HGBA1C in the last 72 hours. CBG: No results for input(s): GLUCAP in the last 168 hours. Lipid Profile: No results for input(s): CHOL, HDL, LDLCALC, TRIG, CHOLHDL, LDLDIRECT in the last 72 hours. Thyroid Function Tests: No results for input(s): TSH, T4TOTAL, FREET4, T3FREE, THYROIDAB in the last 72 hours. Anemia Panel: No results for input(s): VITAMINB12, FOLATE, FERRITIN, TIBC, IRON,  RETICCTPCT in the last 72 hours. Urine analysis:    Component Value Date/Time   COLORURINE YELLOW 05/29/2019 1029   APPEARANCEUR HAZY (A) 05/29/2019 1029   LABSPEC 1.017 05/29/2019 1029   PHURINE 5.0 05/29/2019 1029   GLUCOSEU NEGATIVE 05/29/2019 1029  HGBUR MODERATE (A) 05/29/2019 1029   BILIRUBINUR NEGATIVE 05/29/2019 1029   KETONESUR NEGATIVE 05/29/2019 1029   PROTEINUR 30 (A) 05/29/2019 1029   UROBILINOGEN 0.2 06/14/2011 2309   NITRITE NEGATIVE 05/29/2019 1029   LEUKOCYTESUR NEGATIVE 05/29/2019 1029    Radiological Exams on Admission: DG Chest 1 View  Result Date: 06/28/2020 CLINICAL DATA:  Chest pain. EXAM: CHEST  1 VIEW COMPARISON:  Chest x-ray dated June 15, 2011. FINDINGS: Borderline cardiomegaly. Normal pulmonary vascularity. Left basilar atelectasis. No focal consolidation, pleural effusion, or pneumothorax. No acute osseous abnormality. IMPRESSION: 1. Left basilar atelectasis. Electronically Signed   By: Titus Dubin M.D.   On: 06/28/2020 17:40   DG Elbow Complete Left  Result Date: 06/28/2020 CLINICAL DATA:  Left elbow pain and swelling after physical therapy today. No injury. EXAM: LEFT ELBOW - COMPLETE 3+ VIEW COMPARISON:  None. FINDINGS: No acute fracture or dislocation. Large joint effusion. Joint spaces are preserved. Small marginal osteophytes. Posterior elbow soft tissue swelling. IMPRESSION: 1. Large joint effusion without acute osseous abnormality. In the absence of recent trauma, this could reflect underlying crystal arthropathy given history of gout. Electronically Signed   By: Titus Dubin M.D.   On: 06/28/2020 17:42   DG Wrist Complete Left  Result Date: 06/28/2020 CLINICAL DATA:  Left hand and wrist pain and swelling after physical therapy. No injury. EXAM: LEFT HAND - COMPLETE 3+ VIEW; LEFT WRIST - COMPLETE 3+ VIEW COMPARISON:  Left hand x-rays dated May 04, 2020. FINDINGS: No acute fracture or dislocation. Suspected large erosions involving the second  and third proximal phalanx heads, third metacarpal head, and capitate are unchanged. Unchanged moderate third PIP joint and mild third MCP joint space narrowing. Remaining joint spaces are preserved. Bone mineralization is normal. New diffuse soft tissue swelling of the wrist, dorsal hand, and index finger. Progressive soft tissue swelling of the long finger. IMPRESSION: 1. Diffuse soft tissue swelling. No acute osseous abnormality. 2. 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 Electronically Signed   By: Titus Dubin M.D.   On: 06/28/2020 17:48   DG Shoulder Left  Result Date: 06/28/2020 CLINICAL DATA:  Left shoulder pain after physical therapy today. EXAM: LEFT SHOULDER - 2+ VIEW COMPARISON:  None. FINDINGS: No acute fracture or dislocation. Mild acromioclavicular osteoarthritis. Soft tissues are unremarkable. IMPRESSION: 1. No acute osseous abnormality. Electronically Signed   By: Titus Dubin M.D.   On: 06/28/2020 17:39   DG Hand Complete Left  Result Date: 06/28/2020 CLINICAL DATA:  Left hand and wrist pain and swelling after physical therapy. No injury. EXAM: LEFT HAND - COMPLETE 3+ VIEW; LEFT WRIST - COMPLETE 3+ VIEW COMPARISON:  Left hand x-rays dated May 04, 2020. FINDINGS: No acute fracture or dislocation. Suspected large erosions involving the second and third proximal phalanx heads, third metacarpal head, and capitate are unchanged. Unchanged moderate third PIP joint and mild third MCP joint space narrowing. Remaining joint spaces are preserved. Bone mineralization is normal. New diffuse soft tissue swelling of the wrist, dorsal hand, and index finger. Progressive soft tissue swelling of the long finger. IMPRESSION: 1. Diffuse soft tissue swelling. No acute osseous abnormality. 2. 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 Electronically Signed   By: Titus Dubin  M.D.   On: 06/28/2020 17:48    EKG: Independently reviewed.  No T wave changes or QRS prolongations  Assessment/Plan Active Problems:   AKI (  acute kidney injury) ([place])  (please populate well all problems here in Problem List. (For example, if patient is on BP meds at home and you resume or decide to hold them, it is a problem that needs to be her. Same for CAD, COPD, HLD and so on)  AKI on CKD stage II, probably ATN -Clinically patient is volume depleted -Discussed with on-call nephrologist recommend aggressive hydration, normal saline 125 mL/h -Insert Foley, check renal ultrasound -Discussed with sister in law at bedside, patient situation critical, hopefully kidney function will stabilized and avoid HD  Acute hyperkalemia -Without EKG changes, will give 1 dose of calcium gluconate and insulin, received LoKelma -Some labs stabilized 6.2>5.7 -Continue high rate hydration, recheck K level in the morning  Toxic encephalopathy, uremic likely -Nephrology recommend hydration, if no improvement will consider HD  Acute gouty flareup -IV Solu-Medrol for acute flareup, as now unsure pt can take po meds -Should avoid HCTZ in the future -Hold allopurinol and colchicine  HTN -BP on the low side, stop all home BP meds, start as needed hydralazine  IDDM -Change to sliding scale -Hold all PO DM meds  GI prophylaxis -Start PPI  DVT prophylaxis: Subcu heparin Code Status: Full code Family Communication: Sister-in-law at bedside Disposition Plan: Patient sick, likely will need more than 3 to 4 days hospital stay at least to stabilize kidney function Consults called: Nephrology Admission status: PCU   Lequita Halt MD [facility] Pager [phone]  06/28/2020, 7:14 PM

## 2020-06-28 NOTE — Telephone Encounter (Signed)
Patient was sent Allopurinol

## 2020-06-28 NOTE — ED Provider Notes (Addendum)
[facility] EMERGENCY DEPARTMENT Provider Note   CSN: [id number] Arrival date & time: 06/28/20  1610     History Chief Complaint  Patient presents with  . Hand Pain  . Weakness    [name] is a 59 y.o. male.  Patient with history of gout, arthritis presents to the ED with left hand pain.  Has had pain for the last for 5 days.  Typically walks with a walker or cane.  Patient's physical therapist came today and notified EMS because he appeared weaker than normal.  Patient tells me that he is not wanting to get up and walk around because his hand hurts.  Overall he does not have any back pain or leg pain.  He is able to remember having gout but he cannot particularly remember where he has in the past.  Denies any fevers or chills.  The history is provided by the patient.  Hand Pain This is a recurrent problem. The current episode started more than 2 days ago. The problem occurs daily. The problem has not changed since onset.Pertinent negatives include no chest pain, no abdominal pain, no headaches and no shortness of breath. Nothing aggravates the symptoms. Nothing relieves the symptoms. He has tried nothing for the symptoms. The treatment provided no relief.       Past Medical History:  Diagnosis Date  . Gouty bursitis, elbow  10/18/2015  . Hx of gout   . Hyperlipidemia   . Hypertension   . Knee pain, chronic   . Type II diabetes mellitus [facility])     Patient Active Problem List   Diagnosis Date Noted  . AKI (acute kidney injury) ([place]) 06/28/2020  . Pain due to onychomycosis of toenails of both feet 12/17/2019  . Class 3 severe obesity due to excess calories with serious comorbidity and body mass index (BMI) of 45.0 to 49.9 in adult ([place]) 09/23/2018  . Diabetic neuropathy ([place]) 09/08/2017  . Insomnia 12/26/2016  . Scrotal abscess 10/09/2016  . Acute meniscal tear of right knee 12/29/2015  . Acute meniscal tear of left knee 12/29/2015  . Other specified  crystal arthropathies, left knee 12/29/2015  . Other specified crystal arthropathies, right knee 12/29/2015  . Arthritis of knee   . Inability to ambulate due to knee   . Microcytic anemia   . Bilateral knee pain 12/23/2015  . Hypertension 12/23/2015  . Chronic mid back pain 12/23/2015  . Gout 12/23/2015  . Osteoarthritis of both knees 12/23/2015  . Intractable pain 12/23/2015  . Normocytic anemia 12/23/2015  . Gouty bursitis, elbow  10/18/2015  . IDDM (insulin dependent diabetes mellitus) 10/18/2015    Past Surgical History:  Procedure Laterality Date  . KNEE ARTHROSCOPY Bilateral 12/29/2015   Procedure: ARTHROSCOPY KNEE BILATERAL WITH  PARTIAL MEDIAL AND LATERAL MENISCECTOMY AND FOUR COMPARTMENT SYNOVECTOMY , CHONDRALPLASTIES, PATELLA-FEMORAL CHONDRALPLASTIES BILATERALLY;  Surgeon: Dorna Leitz, MD;  Location: [facility];  Service: Orthopedics;  Laterality: Bilateral;  . NO PAST SURGERIES         Family History  Problem Relation Age of Onset  . Diabetes Mother   . Diabetes Father     Social History   Tobacco Use  . Smoking status: Never Smoker  . Smokeless tobacco: Never Used  Vaping Use  . Vaping Use: Never used  Substance Use Topics  . Alcohol use: No  . Drug use: No    Home Medications Prior to Admission medications   Medication Sig Start Date End Date Taking? Authorizing  Provider  Accu-Chek FastClix Lancets MISC 1 each by Does not apply route 3 (three) times daily. as directed 02/09/20   Charlott Rakes, MD  Alcohol Swabs (B-D SINGLE USE SWABS REGULAR) PADS Please use as instructed before checking blood sugar and before injecting insulin. 02/09/20   Charlott Rakes, MD  allopurinol (ZYLOPRIM) 300 MG tablet Take 1 tablet (300 mg total) by mouth daily. 06/27/20   Charlott Rakes, MD  aspirin 81 MG EC tablet Take 1 tablet (81 mg total) by mouth daily. 12/01/17   Charlott Rakes, MD  atorvastatin (LIPITOR) 40 MG tablet Take 1 tablet (40 mg total) by mouth daily at 6 PM.  03/13/20   Charlott Rakes, MD  benzonatate (TESSALON) 100 MG capsule Take 200 mg by mouth 2 (two) times daily as needed for cough.    [provider]  Blood Glucose Calibration (ACCU-CHEK AVIVA) SOLN Use to calibrate your glucometer when needed. 02/09/20   Charlott Rakes, MD  Blood Glucose Monitoring Suppl (ACCU-CHEK AVIVA PLUS) w/Device KIT USE AS INSTRUCTED THREE TIMES DAILY. E11.69 02/09/20   Charlott Rakes, MD  carbamide peroxide (DEBROX) 6.5 % OTIC solution Place 5 drops into the right ear 2 (two) times daily. 06/12/20   Charlott Rakes, MD  cetirizine (ZYRTEC) 10 MG tablet TAKE 1 TABLET (10 MG TOTAL) BY MOUTH DAILY. 05/05/18   Charlott Rakes, MD  ciprofloxacin-dexamethasone (CIPRODEX) OTIC suspension Place 4 drops into the right ear 2 (two) times daily. 06/12/20   Charlott Rakes, MD  diclofenac sodium (VOLTAREN) 1 % GEL Apply 2 g topically 4 (four) times daily. 09/29/19   Charlott Rakes, MD  DROPLET PEN NEEDLES 32G X 4 MM MISC USE AS DIRECTED DAILY TO ADMINISTER VICTOZA 05/25/20   Charlott Rakes, MD  FEROSUL 325 (65 Fe) MG tablet TAKE 1 TABLET BY MOUTH DAILY WITH BREAKFAST. 10/06/17   Charlott Rakes, MD  furosemide (LASIX) 40 MG tablet Take 1 tablet (40 mg total) by mouth daily. 90day supply 03/13/20   Charlott Rakes, MD  gabapentin (NEURONTIN) 300 MG capsule Take 1 capsule (300 mg total) by mouth 2 (two) times daily. 03/13/20   Charlott Rakes, MD  glucose blood (ACCU-CHEK AVIVA PLUS) test strip USE AS INSTRUCTED THREE TIMES DAILY. E11.69 02/09/20   Charlott Rakes, MD  HYDROcodone-acetaminophen (NORCO/VICODIN) 5-325 MG tablet Take 1 tablet by mouth every 6 (six) hours as needed for severe pain. 05/29/19   Darlin Drop P, PA-C  Insulin Syringe-Needle U-100 (TRUEPLUS INSULIN SYRINGE) 30G X 5/16" 1 ML MISC USE TWICE DAILY 05/17/19   Charlott Rakes, MD  liraglutide (VICTOZA) 18 MG/3ML SOPN Inject 0.2 mLs (1.2 mg total) into the skin every morning. 03/13/20   Charlott Rakes, MD    lisinopril-hydrochlorothiazide (ZESTORETIC) 20-12.5 MG tablet Take 1 tablet by mouth daily. 03/13/20   Charlott Rakes, MD  metFORMIN (GLUCOPHAGE) 500 MG tablet Take 2 tablets (1,000 mg total) by mouth 2 (two) times daily with a meal. 03/13/20   Charlott Rakes, MD  metoprolol succinate (TOPROL-XL) 50 MG 24 hr tablet Take 1 tablet (50 mg total) by mouth daily. 03/13/20   Charlott Rakes, MD  MITIGARE 0.6 MG CAPS Take 2 tabs (1.2 mg) at the onset of a gout flare.  May repeat in 1 hour if symptoms persist. 06/13/20   Charlott Rakes, MD  NOVOLOG MIX 70/30 (70-30) 100 UNIT/ML injection INJECT 48 UNITS SUBCUTANEOUSLY TWICE DAILY WITH A MEAL (DOSE CHANGE) 05/25/20   Charlott Rakes, MD  predniSONE (DELTASONE) 10 MG tablet 6,5,4,3,2,1 take each days dose all at  once with food 03/13/20   Charlott Rakes, MD  sodium zirconium cyclosilicate (LOKELMA) 10 g PACK packet Take 10 g by mouth once for 1 dose. 06/28/20 06/28/20  Curatolo, Adam, DO  traZODone (DESYREL) 50 MG tablet Take 1 tablet (50 mg total) by mouth at bedtime. 06/29/19   Charlott Rakes, MD    Allergies    Patient has no known allergies.  Review of Systems   Review of Systems  Constitutional: Negative for chills and fever.  HENT: Negative for ear pain and sore throat.   Eyes: Negative for pain and visual disturbance.  Respiratory: Negative for cough and shortness of breath.   Cardiovascular: Negative for chest pain and palpitations.  Gastrointestinal: Negative for abdominal pain and vomiting.  Genitourinary: Negative for dysuria and hematuria.  Musculoskeletal: Positive for arthralgias and joint swelling. Negative for back pain.  Skin: Negative for color change and rash.  Neurological: Positive for weakness. Negative for seizures, syncope and headaches.  All other systems reviewed and are negative.   Physical Exam Updated Vital Signs BP 108/71   Pulse 93   Temp 99.5 F (37.5 C) (Rectal)   Resp (!) 25   Ht 5' 3" (1.6 m)   Wt 116.6 kg    SpO2 100%   BMI 45.52 kg/m   Physical Exam Vitals and nursing note reviewed.  Constitutional:      General: He is not in acute distress.    Appearance: He is well-developed. He is not ill-appearing.  HENT:     Head: Normocephalic and atraumatic.     Nose: Nose normal.     Mouth/Throat:     Mouth: Mucous membranes are moist.  Eyes:     Conjunctiva/sclera: Conjunctivae normal.     Pupils: Pupils are equal, round, and reactive to light.  Cardiovascular:     Rate and Rhythm: Normal rate and regular rhythm.     Pulses: Normal pulses.     Heart sounds: Normal heart sounds. No murmur heard.   Pulmonary:     Effort: Pulmonary effort is normal. No respiratory distress.     Breath sounds: Normal breath sounds.  Abdominal:     Palpations: Abdomen is soft.     Tenderness: There is no abdominal tenderness.  Musculoskeletal:        General: Swelling and tenderness present. Normal range of motion.     Cervical back: Normal range of motion and neck supple.     Comments: There is some swelling to the back of the left hand/elbow but no overlying erythema/cellulitis, swelling appears to maybe involve some of the digits including the middle finger with decreased range of motion secondary to pain at wrist/hand/elbow   Skin:    General: Skin is warm and dry.     Findings: No erythema or rash.  Neurological:     General: No focal deficit present.     Mental Status: He is alert and oriented to person, place, and time.     Cranial Nerves: No cranial nerve deficit.     Sensory: No sensory deficit.     Coordination: Coordination normal.     ED Results / Procedures / Treatments   Labs (all labs ordered are listed, but only abnormal results are displayed) Labs Reviewed  CBC WITH DIFFERENTIAL/PLATELET - Abnormal; Notable for the following components:      Result Value   WBC 14.2 (*)    RBC 4.16 (*)    Hemoglobin 10.1 (*)    HCT 31.3 (*)  MCV 75.2 (*)    MCH 24.3 (*)    RDW 18.0 (*)     Neutro Abs 11.9 (*)    Monocytes Absolute 1.1 (*)    All other components within normal limits  BASIC METABOLIC PANEL - Abnormal; Notable for the following components:   Sodium 131 (*)    Potassium 6.2 (*)    Chloride 86 (*)    Glucose, Bld 241 (*)    BUN 132 (*)    Creatinine, Ser 6.68 (*)    GFR calc non Af Amer 8 (*)    GFR calc Af Amer 10 (*)    Anion gap 23 (*)    All other components within normal limits  CK - Abnormal; Notable for the following components:   Total CK 8,128 (*)    All other components within normal limits  URIC ACID - Abnormal; Notable for the following components:   Uric Acid, Serum 9.3 (*)    All other components within normal limits  BASIC METABOLIC PANEL - Abnormal; Notable for the following components:   Sodium 132 (*)    Chloride 88 (*)    Glucose, Bld 222 (*)    BUN 133 (*)    Creatinine, Ser 6.38 (*)    GFR calc non Af Amer 9 (*)    GFR calc Af Amer 10 (*)    Anion gap 20 (*)    All other components within normal limits  POC OCCULT BLOOD, ED - Abnormal; Notable for the following components:   Fecal Occult Bld POSITIVE (*)    All other components within normal limits  I-STAT VENOUS BLOOD GAS, ED - Abnormal; Notable for the following components:   pO2, Ven 124.0 (*)    Sodium 129 (*)    Potassium 5.7 (*)    Calcium, Ion 0.91 (*)    HCT 34.0 (*)    Hemoglobin 11.6 (*)    All other components within normal limits  SARS CORONAVIRUS 2 BY RT PCR (HOSPITAL ORDER, [facility])  URINALYSIS, ROUTINE W REFLEX MICROSCOPIC  NA AND K (SODIUM & POTASSIUM), RAND UR  OSMOLALITY, URINE  BLOOD GAS, VENOUS  BASIC METABOLIC PANEL  CBC  URINALYSIS, ROUTINE W REFLEX MICROSCOPIC  HEMOGLOBIN A1C  OSMOLALITY    EKG EKG Interpretation  Date/Time:  Wednesday June 28 2020 16:20:59 EDT Ventricular Rate:  104 PR Interval:    QRS Duration: 86 QT Interval:  358 QTC Calculation: 471 R Axis:   -94 Text Interpretation: Sinus  tachycardia Right superior axis Low voltage, extremity and precordial leads Consider anterior infarct Confirmed by Lennice Sites [phone]) on 06/28/2020 4:33:11 PM   Radiology DG Chest 1 View  Result Date: 06/28/2020 CLINICAL DATA:  Chest pain. EXAM: CHEST  1 VIEW COMPARISON:  Chest x-ray dated June 15, 2011. FINDINGS: Borderline cardiomegaly. Normal pulmonary vascularity. Left basilar atelectasis. No focal consolidation, pleural effusion, or pneumothorax. No acute osseous abnormality. IMPRESSION: 1. Left basilar atelectasis. Electronically Signed   By: Titus Dubin M.D.   On: 06/28/2020 17:40   DG Elbow Complete Left  Result Date: 06/28/2020 CLINICAL DATA:  Left elbow pain and swelling after physical therapy today. No injury. EXAM: LEFT ELBOW - COMPLETE 3+ VIEW COMPARISON:  None. FINDINGS: No acute fracture or dislocation. Large joint effusion. Joint spaces are preserved. Small marginal osteophytes. Posterior elbow soft tissue swelling. IMPRESSION: 1. Large joint effusion without acute osseous abnormality. In the absence of recent trauma, this could reflect underlying crystal arthropathy given  history of gout. Electronically Signed   By: Titus Dubin M.D.   On: 06/28/2020 17:42   DG Wrist Complete Left  Result Date: 06/28/2020 CLINICAL DATA:  Left hand and wrist pain and swelling after physical therapy. No injury. EXAM: LEFT HAND - COMPLETE 3+ VIEW; LEFT WRIST - COMPLETE 3+ VIEW COMPARISON:  Left hand x-rays dated May 04, 2020. FINDINGS: No acute fracture or dislocation. Suspected large erosions involving the second and third proximal phalanx heads, third metacarpal head, and capitate are unchanged. Unchanged moderate third PIP joint and mild third MCP joint space narrowing. Remaining joint spaces are preserved. Bone mineralization is normal. New diffuse soft tissue swelling of the wrist, dorsal hand, and index finger. Progressive soft tissue swelling of the long finger. IMPRESSION: 1. Diffuse  soft tissue swelling. No acute osseous abnormality. 2. 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 Electronically Signed   By: Titus Dubin M.D.   On: 06/28/2020 17:48   US RENAL  Result Date: 06/28/2020 CLINICAL DATA:  Acute kidney injury EXAM: RENAL / URINARY TRACT ULTRASOUND COMPLETE COMPARISON:  None. FINDINGS: Right Kidney: Renal measurements: 10.0 x 5.6 x 3.8 cm = volume: 110 mL . Echogenicity within normal limits. No mass or hydronephrosis visualized. Left Kidney: Renal measurements: 9.6 x 5.7 x 3.7 cm = volume: 106 mL. Echogenicity within normal limits. No mass or hydronephrosis visualized. Bladder: Appears normal for degree of bladder distention. Other: None. IMPRESSION: No acute findings.  No hydronephrosis. Electronically Signed   By: Rolm Baptise M.D.   On: 06/28/2020 20:20   DG Shoulder Left  Result Date: 06/28/2020 CLINICAL DATA:  Left shoulder pain after physical therapy today. EXAM: LEFT SHOULDER - 2+ VIEW COMPARISON:  None. FINDINGS: No acute fracture or dislocation. Mild acromioclavicular osteoarthritis. Soft tissues are unremarkable. IMPRESSION: 1. No acute osseous abnormality. Electronically Signed   By: Titus Dubin M.D.   On: 06/28/2020 17:39   DG Hand Complete Left  Result Date: 06/28/2020 CLINICAL DATA:  Left hand and wrist pain and swelling after physical therapy. No injury. EXAM: LEFT HAND - COMPLETE 3+ VIEW; LEFT WRIST - COMPLETE 3+ VIEW COMPARISON:  Left hand x-rays dated May 04, 2020. FINDINGS: No acute fracture or dislocation. Suspected large erosions involving the second and third proximal phalanx heads, third metacarpal head, and capitate are unchanged. Unchanged moderate third PIP joint and mild third MCP joint space narrowing. Remaining joint spaces are preserved. Bone mineralization is normal. New diffuse soft tissue swelling of the wrist, dorsal hand, and index finger. Progressive soft tissue swelling  of the long finger. IMPRESSION: 1. Diffuse soft tissue swelling. No acute osseous abnormality. 2. 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 Electronically Signed   By: Titus Dubin M.D.   On: 06/28/2020 17:48    Procedures .Critical Care Performed by: Lennice Sites, DO Authorized by: Lennice Sites, DO   Critical care provider statement:    Critical care time (minutes):  40   Critical care was necessary to treat or prevent imminent or life-threatening deterioration of the following conditions:  Renal failure   Critical care was time spent personally by me on the following activities:  Blood draw for specimens, development of treatment plan with patient or surrogate, discussions with consultants, discussions with primary provider, evaluation of patient's response to treatment, examination of patient, obtaining history from patient or surrogate, ordering and performing treatments and interventions, ordering and  review of laboratory studies, ordering and review of radiographic studies, pulse oximetry, re-evaluation of patient's condition and review of old charts   I assumed direction of critical care for this patient from another provider in my specialty: no     (including critical care time)  Medications Ordered in ED Medications  0.9 %  sodium chloride infusion (has no administration in time range)  aspirin EC tablet 81 mg (has no administration in time range)  atorvastatin (LIPITOR) tablet 40 mg (has no administration in time range)  traZODone (DESYREL) tablet 50 mg (has no administration in time range)  ferrous sulfate tablet 325 mg (has no administration in time range)  loratadine (CLARITIN) tablet 10 mg (has no administration in time range)  heparin injection 5,000 Units (has no administration in time range)  acetaminophen (TYLENOL) tablet 650 mg (has no administration in time range)    Or  acetaminophen (TYLENOL)  suppository 650 mg (has no administration in time range)  HYDROmorphone (DILAUDID) injection 0.5-1 mg (has no administration in time range)  methylPREDNISolone sodium succinate (SOLU-MEDROL) 40 mg/mL injection 40 mg (has no administration in time range)  hydrALAZINE (APRESOLINE) injection 5 mg (has no administration in time range)  insulin aspart (novoLOG) injection 0-9 Units (has no administration in time range)  insulin aspart (novoLOG) injection 0-5 Units (has no administration in time range)  pantoprazole (PROTONIX) injection 40 mg (has no administration in time range)  calcium gluconate 1 g/ 50 mL sodium chloride IVPB (has no administration in time range)  insulin aspart (novoLOG) injection 5 Units (has no administration in time range)  sodium chloride 0.9 % bolus 500 mL (0 mLs Intravenous Stopped 06/28/20 1849)  fentaNYL (SUBLIMAZE) injection 50 mcg (50 mcg Intravenous Given 06/28/20 1730)  sodium zirconium cyclosilicate (LOKELMA) packet 10 g (10 g Oral Given 06/28/20 1851)  sodium chloride 0.9 % bolus 500 mL (500 mLs Intravenous New Bag/Given 06/28/20 1849)    ED Course  I have reviewed the triage vital signs and the nursing notes.  Pertinent labs & imaging results that were available during my care of the patient were reviewed by me and considered in my medical decision making (see chart for details).    MDM Rules/Calculators/A&P                          [name] is a 59 year old male with history of hypertension, diabetes, gout who presents the ED with left hand pain.  Patient has been having some bad pain in his left hand for the last for 5 days.  States that he usually walks with a walker or cane and basically has been refusing to do that secondary to his pain.  He has physical therapy come out to his house ever so often and they did not think he was doing well and sent in for evaluation.  Patient overall with normal vitals.  No fever.  Does have some swelling to the back of the  left hand.  There is no erythema or warmth.  Suspect likely another gout flare.  Has tried multiple medications in the past for this including steroids, indomethacin, colchicine.  We will get basic lab work and x-rays.  He is having left shoulder and left elbow pain as well but he appears of good range of motion in these areas.  Suspect that these are likely referred pain.  Patient is not having any back pain or leg pain or knee pain.  Is able  to lift both of his legs up off the bed and bend them without any pain or discomfort.  Patient with x-rays of the left hand and left elbow that show swelling.  Suspect this is gout related.  He has been on colchicine I think for this.  However he did have significant AKI with a creatinine of 6.68.  Bicarb is 22.  BUN 122.  Hemoglobin down from baseline to 10 however has grossly brown stool on exam despite having a positive Hemoccult.  Have a very low suspicion for GI bleed.  Suspect that AKI is in the setting of dehydration and possibly medication related.  Patient given fluid bolus.  Nephrology was consulted and will evaluate the patient.  We will add serum osmolality, urine asthma studies.  Potassium was 6.2 but hemolyzed.  Will give Lokelma.  EKG did not show any hyperkalemic changes.  Will redraw potassium.  CK has been added as well given possible rhabdo as patient has been nonambulatory for several days.  Admitted to medicine for further care.  Suspect prednisone will likely be the treatment of choice for gout.  CK is also elevated and suspect rhabdomyolysis as also source for AKI.  Fluid hydration has begun.  This chart was dictated using voice recognition software.  Despite best efforts to proofread,  errors can occur which can change the documentation meaning.    Final Clinical Impression(s) / ED Diagnoses Final diagnoses:  AKI (acute kidney injury) ([place])  Arthralgia, unspecified joint  Left elbow pain  Left hand pain  Non-traumatic rhabdomyolysis     Rx / DC Orders ED Discharge Orders         Ordered    sodium zirconium cyclosilicate (LOKELMA) 10 g PACK packet   Once     Discontinue  Reprint     06/28/20 1806    Consult to nephrology     Discontinue    Provider:  (Not yet assigned)   06/28/20 1806           Lennice Sites, DO 06/28/20 1859    Lennice Sites, DO 06/28/20 2042

## 2020-06-29 ENCOUNTER — Telehealth: Payer: Self-pay | Admitting: Orthopaedic Surgery

## 2020-06-29 DIAGNOSIS — M2550 Pain in unspecified joint: Secondary | ICD-10-CM

## 2020-06-29 DIAGNOSIS — M79642 Pain in left hand: Secondary | ICD-10-CM | POA: Diagnosis not present

## 2020-06-29 DIAGNOSIS — M25522 Pain in left elbow: Secondary | ICD-10-CM | POA: Diagnosis not present

## 2020-06-29 DIAGNOSIS — M6282 Rhabdomyolysis: Secondary | ICD-10-CM

## 2020-06-29 DIAGNOSIS — N179 Acute kidney failure, unspecified: Secondary | ICD-10-CM

## 2020-06-29 LAB — BLOOD GAS, VENOUS
Acid-base deficit: 1.5 mmol/L (ref 0.0–2.0)
Bicarbonate: 23.5 mmol/L (ref 20.0–28.0)
FIO2: 21
O2 Saturation: 86.5 %
Patient temperature: 38.1
pCO2, Ven: 47.3 mmHg (ref 44.0–60.0)
pH, Ven: 7.324 (ref 7.250–7.430)
pO2, Ven: 61.3 mmHg — ABNORMAL HIGH (ref 32.0–45.0)

## 2020-06-29 LAB — URINE CULTURE: Culture: NO GROWTH

## 2020-06-29 LAB — FOLATE: Folate: 11.7 ng/mL (ref >5.9)

## 2020-06-29 LAB — CBC
HCT: 29 % — ABNORMAL LOW (ref 39.0–52.0)
Hemoglobin: 9.4 g/dL — ABNORMAL LOW (ref 13.0–17.0)
MCH: 24.2 pg — ABNORMAL LOW (ref 26.0–34.0)
MCHC: 32.4 g/dL (ref 30.0–36.0)
MCV: 74.7 fL — ABNORMAL LOW (ref 80.0–100.0)
Platelets: 190 10*3/uL (ref 150–400)
RBC: 3.88 MIL/uL — ABNORMAL LOW (ref 4.22–5.81)
RDW: 16.9 % — ABNORMAL HIGH (ref 11.5–15.5)
WBC: 11.8 10*3/uL — ABNORMAL HIGH (ref 4.0–10.5)
nRBC: 0 % (ref 0.0–0.2)

## 2020-06-29 LAB — OSMOLALITY: Osmolality: 332 mOsm/kg (ref 275–295)

## 2020-06-29 LAB — IRON AND TIBC
Iron: 40 ug/dL — ABNORMAL LOW (ref 45–182)
Saturation Ratios: 22 % (ref 17.9–39.5)
TIBC: 182 ug/dL — ABNORMAL LOW (ref 250–450)
UIBC: 142 ug/dL

## 2020-06-29 LAB — HEMOGLOBIN A1C
Hgb A1c MFr Bld: 7.1 % — ABNORMAL HIGH (ref 4.8–5.6)
Mean Plasma Glucose: 157.07 mg/dL

## 2020-06-29 LAB — GLUCOSE, CAPILLARY
Glucose-Capillary: 293 mg/dL — ABNORMAL HIGH (ref 70–99)
Glucose-Capillary: 318 mg/dL — ABNORMAL HIGH (ref 70–99)
Glucose-Capillary: 348 mg/dL — ABNORMAL HIGH (ref 70–99)
Glucose-Capillary: 419 mg/dL — ABNORMAL HIGH (ref 70–99)
Glucose-Capillary: 430 mg/dL — ABNORMAL HIGH (ref 70–99)
Glucose-Capillary: 440 mg/dL — ABNORMAL HIGH (ref 70–99)
Glucose-Capillary: 467 mg/dL — ABNORMAL HIGH (ref 70–99)

## 2020-06-29 LAB — VITAMIN B12: Vitamin B-12: 901 pg/mL (ref 180–914)

## 2020-06-29 LAB — BASIC METABOLIC PANEL
Anion gap: 20 — ABNORMAL HIGH (ref 5–15)
BUN: 123 mg/dL — ABNORMAL HIGH (ref 6–20)
CO2: 21 mmol/L — ABNORMAL LOW (ref 22–32)
Calcium: 9 mg/dL (ref 8.9–10.3)
Chloride: 95 mmol/L — ABNORMAL LOW (ref 98–111)
Creatinine, Ser: 4.92 mg/dL — ABNORMAL HIGH (ref 0.61–1.24)
GFR calc Af Amer: 14 mL/min — ABNORMAL LOW (ref >60)
GFR calc non Af Amer: 12 mL/min — ABNORMAL LOW (ref >60)
Glucose, Bld: 272 mg/dL — ABNORMAL HIGH (ref 70–99)
Potassium: 4.1 mmol/L (ref 3.5–5.1)
Sodium: 136 mmol/L (ref 135–145)

## 2020-06-29 LAB — RETICULOCYTES
Immature Retic Fract: 12.8 % (ref 2.3–15.9)
RBC.: 3.93 MIL/uL — ABNORMAL LOW (ref 4.22–5.81)
Retic Count, Absolute: 28.3 10*3/uL (ref 19.0–186.0)
Retic Ct Pct: 0.7 % (ref 0.4–3.1)

## 2020-06-29 LAB — FERRITIN: Ferritin: 774 ng/mL — ABNORMAL HIGH (ref 24–336)

## 2020-06-29 LAB — CK: Total CK: 6635 U/L — ABNORMAL HIGH (ref 49–397)

## 2020-06-29 LAB — LACTIC ACID, PLASMA
Lactic Acid, Venous: 1.6 mmol/L (ref 0.5–1.9)
Lactic Acid, Venous: 2.4 mmol/L (ref 0.5–1.9)

## 2020-06-29 LAB — PROCALCITONIN: Procalcitonin: 9.64 ng/mL

## 2020-06-29 MED ORDER — INSULIN ASPART 100 UNIT/ML ~~LOC~~ SOLN
10.0000 [IU] | Freq: Once | SUBCUTANEOUS | Status: AC
  Administered 2020-06-29: 10 [IU] via SUBCUTANEOUS

## 2020-06-29 MED ORDER — INSULIN ASPART 100 UNIT/ML ~~LOC~~ SOLN: 5.0000 [IU] | Freq: Once | SUBCUTANEOUS | Status: DC

## 2020-06-29 MED ORDER — PANTOPRAZOLE SODIUM 40 MG PO TBEC
40.0000 mg | DELAYED_RELEASE_TABLET | Freq: Every day | ORAL | Status: DC
  Administered 2020-06-29 – 2020-07-10 (×13): 40 mg via ORAL
  Filled 2020-06-29 (×12): qty 1

## 2020-06-29 MED ORDER — SODIUM CHLORIDE 0.9 % IV SOLN: INTRAVENOUS | Status: AC

## 2020-06-29 MED ORDER — INSULIN GLARGINE 100 UNIT/ML ~~LOC~~ SOLN
15.0000 [IU] | Freq: Every day | SUBCUTANEOUS | Status: DC
  Administered 2020-06-29: 15 [IU] via SUBCUTANEOUS
  Filled 2020-06-29 (×2): qty 0.15

## 2020-06-29 MED ORDER — CHLORHEXIDINE GLUCONATE CLOTH 2 % EX PADS
6.0000 | MEDICATED_PAD | Freq: Every day | CUTANEOUS | Status: DC
  Administered 2020-06-29 – 2020-07-06 (×7): 6 via TOPICAL

## 2020-06-29 MED ORDER — INSULIN ASPART 100 UNIT/ML ~~LOC~~ SOLN
0.0000 [IU] | Freq: Three times a day (TID) | SUBCUTANEOUS | Status: DC
  Administered 2020-06-30: 15 [IU] via SUBCUTANEOUS

## 2020-06-29 NOTE — Consult Note (Signed)
Reason for Consult:LUE pain Referring Physician: Clayton Bibles [name] is an 59 y.o. male.  HPI: [name] was admitted with multiple joint pain yesterday. This has been progressive for about a week though the pt tells me it's since yesterday. He c/o severe pain from left fingertips to elbow. This is very consistent with his previous gout flares. He denies current fevers, chills, sweats, N/V but has had some of that over the last 2 weeks. He is RHD.  Past Medical History:  Diagnosis Date  . Gouty bursitis, elbow  10/18/2015  . Hx of gout   . Hyperlipidemia   . Hypertension   . Knee pain, chronic   . Type II diabetes mellitus ([place])     Past Surgical History:  Procedure Laterality Date  . KNEE ARTHROSCOPY Bilateral 12/29/2015   Procedure: ARTHROSCOPY KNEE BILATERAL WITH  PARTIAL MEDIAL AND LATERAL MENISCECTOMY AND FOUR COMPARTMENT SYNOVECTOMY , CHONDRALPLASTIES, PATELLA-FEMORAL CHONDRALPLASTIES BILATERALLY;  Surgeon: Dorna Leitz, MD;  Location: [facility];  Service: Orthopedics;  Laterality: Bilateral;  . NO PAST SURGERIES      Family History  Problem Relation Age of Onset  . Diabetes Mother   . Diabetes Father     Social History:  reports that he has never smoked. He has never used smokeless tobacco. He reports that he does not drink alcohol and does not use drugs.  Allergies: No Known Allergies  Medications: I have reviewed the patient's current medications.  Results for orders placed or performed during the hospital encounter of 06/28/20 (from the past 48 hour(s))  CBC with Differential     Status: Abnormal   Collection Time: 06/28/20  4:53 PM  Result Value Ref Range   WBC 14.2 (H) 4.0 - 10.5 K/uL   RBC 4.16 (L) 4.22 - 5.81 MIL/uL   Hemoglobin 10.1 (L) 13.0 - 17.0 g/dL   HCT 31.3 (L) 39 - 52 %   MCV 75.2 (L) 80.0 - 100.0 fL   MCH 24.3 (L) 26.0 - 34.0 pg   MCHC 32.3 30.0 - 36.0 g/dL   RDW 18.0 (H) 11.5 - 15.5 %   Platelets 183 150 - 400 K/uL   nRBC 0.0 0.0 - 0.2 %    Neutrophils Relative % 84 %   Neutro Abs 11.9 (H) 1.7 - 7.7 K/uL   Lymphocytes Relative 7 %   Lymphs Abs 1.1 0.7 - 4.0 K/uL   Monocytes Relative 8 %   Monocytes Absolute 1.1 (H) 0 - 1 K/uL   Eosinophils Relative 0 %   Eosinophils Absolute 0.0 0 - 0 K/uL   Basophils Relative 0 %   Basophils Absolute 0.0 0 - 0 K/uL   Immature Granulocytes 1 %   Abs Immature Granulocytes 0.07 0.00 - 0.07 K/uL    Comment: Performed at [facility], [address], [place], [place] [address]  Basic metabolic panel     Status: Abnormal   Collection Time: 06/28/20  4:53 PM  Result Value Ref Range   Sodium 131 (L) 135 - 145 mmol/L   Potassium 6.2 (H) 3.5 - 5.1 mmol/L    Comment: SPECIMEN HEMOLYZED. HEMOLYSIS MAY AFFECT INTEGRITY OF RESULTS.   Chloride 86 (L) 98 - 111 mmol/L   CO2 22 22 - 32 mmol/L   Glucose, Bld 241 (H) 70 - 99 mg/dL    Comment: Glucose reference range applies only to samples taken after fasting for at least 8 hours.   BUN 132 (H) 6 - 20 mg/dL  Creatinine, Ser 6.68 (H) 0.61 - 1.24 mg/dL   Calcium 9.0 8.9 - 10.3 mg/dL   GFR calc non Af Amer 8 (L) >60 mL/min   GFR calc Af Amer 10 (L) >60 mL/min   Anion gap 23 (H) 5 - 15    Comment: Performed at [facility] [address], [place], [place] [address]  POC occult blood, ED     Status: Abnormal   Collection Time: 06/28/20  5:50 PM  Result Value Ref Range   Fecal Occult Bld POSITIVE (A) NEGATIVE  SARS Coronavirus 2 by RT PCR (hospital order, performed in [facility] hospital lab) Nasopharyngeal Nasopharyngeal Swab     Status: None   Collection Time: 06/28/20  6:43 PM   Specimen: Nasopharyngeal Swab  Result Value Ref Range   SARS Coronavirus 2 NEGATIVE NEGATIVE    Comment: (NOTE) SARS-CoV-2 target nucleic acids are NOT DETECTED.  The SARS-CoV-2 RNA is generally detectable in upper and lower respiratory specimens during the acute phase of infection. The lowest concentration of SARS-CoV-2 viral copies this assay can detect is  250 copies / mL. A negative result does not preclude SARS-CoV-2 infection and should not be used as the sole basis for treatment or other patient management decisions.  A negative result may occur with improper specimen collection / handling, submission of specimen other than nasopharyngeal swab, presence of viral mutation(s) within the areas targeted by this assay, and inadequate number of viral copies (<250 copies / mL). A negative result must be combined with clinical observations, patient history, and epidemiological information.  Fact Sheet for Patients:   StrictlyIdeas.no  Fact Sheet for Healthcare Providers: BankingDealers.co.za  This test is not yet approved or  cleared by the [place] FDA and has been authorized for detection and/or diagnosis of SARS-CoV-2 by FDA under an Emergency Use Authorization (EUA).  This EUA will remain in effect (meaning this test can be used) for the duration of the COVID-19 declaration under Section 564(b)(1) of the Act, 21 U.S.C. section 360bbb-3(b)(1), unless the authorization is terminated or revoked sooner.  Performed at [facility], [place] [address], [place], [place] [address]   I-Stat venous blood gas, ED     Status: Abnormal   Collection Time: 06/28/20  6:54 PM  Result Value Ref Range   pH, Ven 7.400 7.25 - 7.43   pCO2, Ven 44.2 44 - 60 mmHg   pO2, Ven 124.0 (H) 32 - 45 mmHg   Bicarbonate 27.4 20.0 - 28.0 mmol/L   TCO2 29 22 - 32 mmol/L   O2 Saturation 99.0 %   Acid-Base Excess 2.0 0.0 - 2.0 mmol/L   Sodium 129 (L) 135 - 145 mmol/L   Potassium 5.7 (H) 3.5 - 5.1 mmol/L   Calcium, Ion 0.91 (L) 1.15 - 1.40 mmol/L   HCT 34.0 (L) 39 - 52 %   Hemoglobin 11.6 (L) 13.0 - 17.0 g/dL   Sample type VENOUS   CK     Status: Abnormal   Collection Time: 06/28/20  6:55 PM  Result Value Ref Range   Total CK 8,128 (H) 49.0 - 397.0 U/L    Comment: RESULTS CONFIRMED BY MANUAL  DILUTION Performed at [facility], [address], [place], [place] [address]   Uric acid     Status: Abnormal   Collection Time: 06/28/20  6:55 PM  Result Value Ref Range   Uric Acid, Serum 9.3 (H) 3.7 - 8.6 mg/dL    Comment: Performed at  [facility], [place] [address] [place], [place] [address]  Basic metabolic panel     Status: Abnormal   Collection Time: 06/28/20  6:55 PM  Result Value Ref Range   Sodium 132 (L) 135 - 145 mmol/L   Potassium 3.7 3.5 - 5.1 mmol/L   Chloride 88 (L) 98 - 111 mmol/L   CO2 24 22 - 32 mmol/L   Glucose, Bld 222 (H) 70 - 99 mg/dL    Comment: Glucose reference range applies only to samples taken after fasting for at least 8 hours.   BUN 133 (H) 6 - 20 mg/dL   Creatinine, Ser 6.38 (H) 0.61 - 1.24 mg/dL   Calcium 8.9 8.9 - 10.3 mg/dL   GFR calc non Af Amer 9 (L) >60 mL/min   GFR calc Af Amer 10 (L) >60 mL/min   Anion gap 20 (H) 5 - 15    Comment: Performed at [facility] [address], [place], [place] [address]  Glucose, capillary     Status: Abnormal   Collection Time: 06/28/20  9:26 PM  Result Value Ref Range   Glucose-Capillary 194 (H) 70 - 99 mg/dL    Comment: Glucose reference range applies only to samples taken after fasting for at least 8 hours.  Urinalysis, Routine w reflex microscopic Urine, Catheterized     Status: Abnormal   Collection Time: 06/28/20 10:00 PM  Result Value Ref Range   Color, Urine YELLOW YELLOW   APPearance HAZY (A) CLEAR   Specific Gravity, Urine 1.013 1.005 - 1.030   pH 5.0 5.0 - 8.0   Glucose, UA NEGATIVE NEGATIVE mg/dL   Hgb urine dipstick LARGE (A) NEGATIVE   Bilirubin Urine NEGATIVE NEGATIVE   Ketones, ur NEGATIVE NEGATIVE mg/dL   Protein, ur NEGATIVE NEGATIVE mg/dL   Nitrite NEGATIVE NEGATIVE   Leukocytes,Ua NEGATIVE NEGATIVE   RBC / HPF 11-20 0 - 5 RBC/hpf   WBC, UA 0-5 0 - 5 WBC/hpf   Bacteria, UA FEW (A) NONE SEEN   Squamous Epithelial / LPF 0-5 0 - 5   Mucus PRESENT    Hyaline Casts,  UA PRESENT     Comment: Performed at [facility], [address] [place], [place] [address]  Na and K (sodium & potassium), rand urine     Status: None   Collection Time: 06/28/20 10:00 PM  Result Value Ref Range   Sodium, Ur 42 mmol/L   Potassium Urine 42 mmol/L    Comment: Performed at [facility] [address], [place], [place] [address]  Osmolality, urine     Status: None   Collection Time: 06/28/20 10:00 PM  Result Value Ref Range   Osmolality, Ur 379 300 - 900 mOsm/kg    Comment: Performed at [facility] [address] [place], [place] [address]  Blood gas, venous (at [facility], not at [facility])     Status: Abnormal   Collection Time: 06/28/20 11:26 PM  Result Value Ref Range   FIO2 21.00    pH, Ven 7.324 7.25 - 7.43   pCO2, Ven 47.3 44 - 60 mmHg   pO2, Ven 61.3 (H) 32 - 45 mmHg   Bicarbonate 23.5 20.0 - 28.0 mmol/L   Acid-base deficit 1.5 0.0 - 2.0 mmol/L   O2 Saturation 86.5 %   Patient temperature 38.1    Collection site VENOUS     Comment: RIGHT HAND   Drawn by DRAWN BY RN  Sample type VENOUS     Comment: Performed at [facility], [place] [address] [place], [place] [address]  Hemoglobin A1c     Status: Abnormal   Collection Time: 06/28/20 11:26 PM  Result Value Ref Range   Hgb A1c MFr Bld 7.1 (H) 4.8 - 5.6 %    Comment: (NOTE) Pre diabetes:          5.7%-6.4%  Diabetes:              >6.4%  Glycemic control for   <7.0% adults with diabetes    Mean Plasma Glucose 157.07 mg/dL    Comment: Performed at [facility] [address], [place], [place] [address]  Osmolality     Status: Abnormal   Collection Time: 06/28/20 11:26 PM  Result Value Ref Range   Osmolality 332 (HH) 275 - 295 mOsm/kg    Comment: REPEATED TO VERIFY CRITICAL RESULT CALLED TO, READ BACK BY AND VERIFIED WITH: J OZA,RN 06/29/2020 [place] Performed at [facility], [place] [address], [place], [place] [address]   CK     Status: Abnormal   Collection  Time: 06/28/20 11:28 PM  Result Value Ref Range   Total CK 6,635 (H) 49.0 - 397.0 U/L    Comment: RESULTS CONFIRMED BY MANUAL DILUTION Performed at [facility], [place] [address], [place], [place] [address]   Lactic acid, plasma     Status: Abnormal   Collection Time: 06/28/20 11:50 PM  Result Value Ref Range   Lactic Acid, Venous 2.4 (HH) 0.5 - 1.9 mmol/L    Comment: CRITICAL RESULT CALLED TO, READ BACK BY AND VERIFIED WITH: OZA J,RN 06/29/20 0033 [facility] Performed at [facility], [place] [address] [place], [place] [address]   Basic metabolic panel     Status: Abnormal   Collection Time: 06/29/20  3:30 AM  Result Value Ref Range   Sodium 136 135 - 145 mmol/L   Potassium 4.1 3.5 - 5.1 mmol/L   Chloride 95 (L) 98 - 111 mmol/L   CO2 21 (L) 22 - 32 mmol/L   Glucose, Bld 272 (H) 70 - 99 mg/dL    Comment: Glucose reference range applies only to samples taken after fasting for at least 8 hours.   BUN 123 (H) 6 - 20 mg/dL   Creatinine, Ser 4.92 (H) 0.61 - 1.24 mg/dL   Calcium 9.0 8.9 - 10.3 mg/dL   GFR calc non Af Amer 12 (L) >60 mL/min   GFR calc Af Amer 14 (L) >60 mL/min   Anion gap 20 (H) 5 - 15    Comment: Performed at [facility] [address] [place], [place] [address]  Lactic acid, plasma     Status: None   Collection Time: 06/29/20  3:30 AM  Result Value Ref Range   Lactic Acid, Venous 1.6 0.5 - 1.9 mmol/L    Comment: Performed at [facility] [address] [place], [place] [address]  Procalcitonin - Baseline     Status: None   Collection Time: 06/29/20  3:30 AM  Result Value Ref Range   Procalcitonin 9.64 ng/mL    Comment:        Interpretation: PCT > 2 ng/mL: Systemic infection (sepsis) is likely, unless other causes are known. (NOTE)       Sepsis PCT Algorithm           Lower Respiratory Tract  Infection PCT Algorithm    ----------------------------     ----------------------------         PCT < 0.25  ng/mL                PCT < 0.10 ng/mL          Strongly encourage             Strongly discourage   discontinuation of antibiotics    initiation of antibiotics    ----------------------------     -----------------------------       PCT 0.25 - 0.50 ng/mL            PCT 0.10 - 0.25 ng/mL               OR       >80% decrease in PCT            Discourage initiation of                                            antibiotics      Encourage discontinuation           of antibiotics    ----------------------------     -----------------------------         PCT >= 0.50 ng/mL              PCT 0.26 - 0.50 ng/mL               AND       <80% decrease in PCT              Encourage initiation of                                             antibiotics       Encourage continuation           of antibiotics    ----------------------------     -----------------------------        PCT >= 0.50 ng/mL                  PCT > 0.50 ng/mL               AND         increase in PCT                  Strongly encourage                                      initiation of antibiotics    Strongly encourage escalation           of antibiotics                                     -----------------------------                                           PCT <= 0.25 ng/mL  OR                                        > 80% decrease in PCT                                      Discontinue / Do not initiate                                             antibiotics  Performed at [facility], [place] [address] [place], [place] [address]   CBC     Status: Abnormal   Collection Time: 06/29/20  3:30 AM  Result Value Ref Range   WBC 11.8 (H) 4.0 - 10.5 K/uL   RBC 3.88 (L) 4.22 - 5.81 MIL/uL   Hemoglobin 9.4 (L) 13.0 - 17.0 g/dL   HCT 29.0 (L) 39 - 52 %   MCV 74.7 (L) 80.0 - 100.0 fL   MCH 24.2 (L) 26.0 - 34.0 pg   MCHC 32.4 30.0 - 36.0 g/dL   RDW 16.9 (H) 11.5 - 15.5 %    Platelets 190 150 - 400 K/uL   nRBC 0.0 0.0 - 0.2 %    Comment: Performed at [facility] [address], [place], [place] [address]  Glucose, capillary     Status: Abnormal   Collection Time: 06/29/20  6:03 AM  Result Value Ref Range   Glucose-Capillary 293 (H) 70 - 99 mg/dL    Comment: Glucose reference range applies only to samples taken after fasting for at least 8 hours.  Glucose, capillary     Status: Abnormal   Collection Time: 06/29/20 11:21 AM  Result Value Ref Range   Glucose-Capillary 318 (H) 70 - 99 mg/dL    Comment: Glucose reference range applies only to samples taken after fasting for at least 8 hours.    DG Chest 1 View  Result Date: 06/28/2020 CLINICAL DATA:  Chest pain. EXAM: CHEST  1 VIEW COMPARISON:  Chest x-ray dated June 15, 2011. FINDINGS: Borderline cardiomegaly. Normal pulmonary vascularity. Left basilar atelectasis. No focal consolidation, pleural effusion, or pneumothorax. No acute osseous abnormality. IMPRESSION: 1. Left basilar atelectasis. Electronically Signed   By: Titus Dubin M.D.   On: 06/28/2020 17:40   DG Elbow Complete Left  Result Date: 06/28/2020 CLINICAL DATA:  Left elbow pain and swelling after physical therapy today. No injury. EXAM: LEFT ELBOW - COMPLETE 3+ VIEW COMPARISON:  None. FINDINGS: No acute fracture or dislocation. Large joint effusion. Joint spaces are preserved. Small marginal osteophytes. Posterior elbow soft tissue swelling. IMPRESSION: 1. Large joint effusion without acute osseous abnormality. In the absence of recent trauma, this could reflect underlying crystal arthropathy given history of gout. Electronically Signed   By: Titus Dubin M.D.   On: 06/28/2020 17:42   DG Wrist Complete Left  Result Date: 06/28/2020 CLINICAL DATA:  Left hand and wrist pain and swelling after physical therapy. No injury. EXAM: LEFT HAND - COMPLETE 3+ VIEW; LEFT WRIST - COMPLETE 3+ VIEW COMPARISON:  Left hand x-rays dated May 04, 2020.  FINDINGS: No acute fracture or dislocation. Suspected large erosions involving the second and third proximal phalanx heads, third  metacarpal head, and capitate are unchanged. Unchanged moderate third PIP joint and mild third MCP joint space narrowing. Remaining joint spaces are preserved. Bone mineralization is normal. New diffuse soft tissue swelling of the wrist, dorsal hand, and index finger. Progressive soft tissue swelling of the long finger. IMPRESSION: 1. Diffuse soft tissue swelling. No acute osseous abnormality. 2. 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 Electronically Signed   By: Titus Dubin M.D.   On: 06/28/2020 17:48   US RENAL  Result Date: 06/28/2020 CLINICAL DATA:  Acute kidney injury EXAM: RENAL / URINARY TRACT ULTRASOUND COMPLETE COMPARISON:  None. FINDINGS: Right Kidney: Renal measurements: 10.0 x 5.6 x 3.8 cm = volume: 110 mL . Echogenicity within normal limits. No mass or hydronephrosis visualized. Left Kidney: Renal measurements: 9.6 x 5.7 x 3.7 cm = volume: 106 mL. Echogenicity within normal limits. No mass or hydronephrosis visualized. Bladder: Appears normal for degree of bladder distention. Other: None. IMPRESSION: No acute findings.  No hydronephrosis. Electronically Signed   By: Rolm Baptise M.D.   On: 06/28/2020 20:20   DG Shoulder Left  Result Date: 06/28/2020 CLINICAL DATA:  Left shoulder pain after physical therapy today. EXAM: LEFT SHOULDER - 2+ VIEW COMPARISON:  None. FINDINGS: No acute fracture or dislocation. Mild acromioclavicular osteoarthritis. Soft tissues are unremarkable. IMPRESSION: 1. No acute osseous abnormality. Electronically Signed   By: Titus Dubin M.D.   On: 06/28/2020 17:39   DG Hand Complete Left  Result Date: 06/28/2020 CLINICAL DATA:  Left hand and wrist pain and swelling after physical therapy. No injury. EXAM: LEFT HAND - COMPLETE 3+ VIEW; LEFT WRIST - COMPLETE 3+ VIEW  COMPARISON:  Left hand x-rays dated May 04, 2020. FINDINGS: No acute fracture or dislocation. Suspected large erosions involving the second and third proximal phalanx heads, third metacarpal head, and capitate are unchanged. Unchanged moderate third PIP joint and mild third MCP joint space narrowing. Remaining joint spaces are preserved. Bone mineralization is normal. New diffuse soft tissue swelling of the wrist, dorsal hand, and index finger. Progressive soft tissue swelling of the long finger. IMPRESSION: 1. Diffuse soft tissue swelling. No acute osseous abnormality. 2. 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 Electronically Signed   By: Titus Dubin M.D.   On: 06/28/2020 17:48    Review of Systems  Constitutional: Negative for chills, diaphoresis and fever.  HENT: Negative for ear discharge, ear pain, hearing loss and tinnitus.   Eyes: Negative for photophobia and pain.  Respiratory: Negative for cough and shortness of breath.   Cardiovascular: Negative for chest pain.  Gastrointestinal: Negative for abdominal pain, nausea and vomiting.  Genitourinary: Negative for dysuria, flank pain, frequency and urgency.  Musculoskeletal: Positive for arthralgias (LUE). Negative for back pain, myalgias and neck pain.  Neurological: Negative for dizziness and headaches.  Hematological: Does not bruise/bleed easily.  Psychiatric/Behavioral: The patient is not nervous/anxious.    Blood pressure 110/67, pulse 86, temperature 97.8 F (36.6 C), temperature source Oral, resp. rate 16, height 5\' 5"  (1.651 m), weight 114.2 kg, SpO2 98 %. Physical Exam Constitutional:      General: He is not in acute distress.    Appearance: He is well-developed. He is not diaphoretic.  HENT:     Head: Normocephalic and atraumatic.  Eyes:     General: No scleral icterus.       Right eye: No discharge.  Left eye: No discharge.     Conjunctiva/sclera:  Conjunctivae normal.  Cardiovascular:     Rate and Rhythm: Normal rate and regular rhythm.  Pulmonary:     Effort: Pulmonary effort is normal. No respiratory distress.  Musculoskeletal:     Cervical back: Normal range of motion.     Comments: Left shoulder, elbow, wrist, digits- no skin wounds, sling in place, severe TTP hand to elbow, exquisite pain with PROM DIP to elbow, no obvious instability  Sens  Ax/R/M/U intact  Mot   Ax/ R/ PIN/ M/ AIN/ U could not assess  Rad 2+  Skin:    General: Skin is warm and dry.  Neurological:     Mental Status: He is alert.  Psychiatric:        Behavior: Behavior normal.     Assessment/Plan: LUE pain -- Agree this is likely gout flare. Articular injection likely not beneficial with this degree of involvement. Agree with steroid treatment, could be switched to PO from ortho standpoint. NSAID's contraindicated given renal fxn. Multiple medical problems including chronic gouty arthritis of multiple joints, CKD stage II, IDDM, hypertension, hyperlipidemia, chronic iron deficiency anemia -- per primary service    Lisette Abu, PA-C Orthopedic Surgery [phone] 06/29/2020, 3:09 PM

## 2020-06-29 NOTE — Telephone Encounter (Signed)
I have sent message to Dr Erlinda Hong.

## 2020-06-29 NOTE — Progress Notes (Signed)
CRITICAL VALUE ALERT  Critical Value: Urine Osmolality 332  Date & Time Notied:  06/29/20 0045  Provider Notified: Dr. Myna Hidalgo  Orders Received/Actions taken: Awaiting orders

## 2020-06-29 NOTE — Progress Notes (Signed)
Pt transferred from ED to [id number]. CHG bath given. Tele monitor placed. CCMD notified. Pt is A&Ox4. C/o of pain 4/10 in L arm. Sling placed from previous injury prior to admission. Dr. Gean Quint at bedside. Call bell within reach. Will continue to monitor.   Fransico Michael, RN

## 2020-06-29 NOTE — Progress Notes (Signed)
Inpatient Diabetes Program Recommendations  AACE/ADA: New Consensus Statement on Inpatient Glycemic Control (2015)  Target Ranges:  Prepandial:   less than 140 mg/dL      Peak postprandial:   less than 180 mg/dL (1-2 hours)      Critically ill patients:  140 - 180 mg/dL   Lab Results  Component Value Date   GLUCAP 318 (H) 06/29/2020   HGBA1C 7.1 (H) 06/28/2020    Review of Glycemic Control Results for [name], [name] (MRN [id number]) as of 06/29/2020 11:27  Ref. Range 06/28/2020 21:26 06/29/2020 06:03 06/29/2020 11:21  Glucose-Capillary Latest Ref Range: 70 - 99 mg/dL 194 (H) 293 (H) 318 (H)   Diabetes history: DM2  Outpatient Diabetes medications:  70/30 50 units BID  Victoza 1.2 mg daily  Metformin 1000 mg BID  Current orders for Inpatient glycemic control:  Novolog 0-9 units TID  Novolog 0-5 units QHS  Solumedrol 40 mg Q12H  Inpatient Diabetes Program Recommendations:     Levemir 18 units BID Novolog 3 units with meals if eats at least 50% of meals  Will continue to follow while inpatient.  Thank you, Reche Dixon, RN, BSN Diabetes Coordinator Inpatient Diabetes Program [phone] (team pager from 8a-5p)

## 2020-06-29 NOTE — Progress Notes (Signed)
PROGRESS NOTE    [name]  HBZ:[id number] DOB: 1961-08-14 DOA: 06/28/2020 PCP: Charlott Rakes, MD    Chief Complaint  Patient presents with  . Hand Pain  . Weakness    Brief Narrative:  59 year old gentleman with prior history of chronic gouty arthritis of multiple joint, stage II CKD, insulin-dependent diabetes mellitus, hypertension, hyperlipidemia, anemia of chronic disease, iron deficiency anemia, presents to ED for lethargy and multijoint pain. As per the sister-in-law at bedside patient was unable to stand or walk and was brought to ED.  He was found to be in AKI, hyperkalemic and in rhabdomyolysis. Assessment & Plan:   Active Problems:   AKI (acute kidney injury) ([place])   AKI on stage II CKD Probably secondary to volume depletion/renal hypoperfusion. Started the patient on IV fluid resuscitation and renal parameters have improved.  Nephrology consulted and recommendations given to continue with fluids.  Ultrasound renal did not show any hydronephrosis at this time.    Acute hyperkalemia No EKG changes.  Improved with hydration.  Repeat potassium level this morning within normal limits.    Toxic encephalopathy probably uremic in nature slowly improving with improvement in renal parameters.    Acute gouty flareup Patient was started on IV Solu-Medrol, plan to transition to oral steroids in the morning.  Continue to hold allopurinol, colchicine and hydrochlorothiazide at this time.    Insulin-dependent diabetes mellitus, uncontrolled with hyperglycemia probably secondary to IV steroids CBG (last 3)  Recent Labs    06/28/20 2126 06/29/20 0603 06/29/20 1121  GLUCAP 194* 293* 318*   Resume sliding scale insulin for now.  a1C IS 7.1 Add NovoLog 4 units 3 times daily AC.   Essential hypertension Slightly hypotensive earlier this morning but blood pressure parameters  are optimal at this time.    Iron deficiency anemia, anemia of chronic  disease Baseline hemoglobin around 11 in 2020 November.  Slow decline to 9.4 in the last 8 months. Stool for occult blood is positive. Transfuse to keep hemoglobin greater than seven.  Continue to monitor. Will get GI evaluation in the morning.    Elevated lactic acid Probably secondary to dehydration, normalized with IV fluids.   Nontraumatic rhabdomyolysis with AKI, anion gap metabolic acidosis Improving with IV fluids. Recheck CK and renal parameters in the morning.  Fever Probably secondary to acute gouty flareup of multiple joints Blood cultures have been negative so far. Urine analysis is negative for infection. Chest x-ray does not show an acute cardiopulmonary abnormalities    DVT prophylaxis: (Heparin) Code Status: full code.  Family Communication: none at bedside.  Disposition:   Status is: Inpatient  Remains inpatient appropriate because:IV treatments appropriate due to intensity of illness or inability to take PO   Dispo: The patient is from: Home              Anticipated d/c is to: pending.              Anticipated d/c date is: > 3 days              Patient currently is not medically stable to d/c.       Consultants:   Orthopedics  Nephrology   Procedures: none.   Antimicrobials: none.   Subjective: Left elbow pain and tenderness on the slight.   Objective: Vitals:   06/29/20 0600 06/29/20 0758 06/29/20 0823 06/29/20 1111  BP: 105/61 103/63 108/62 110/67  Pulse: 89 91 91 86  Resp:  17 15 16   Temp:  97.8 F (36.6 C) 97.8 F (36.6 C) 97.8 F (36.6 C)  TempSrc:  Oral Oral Oral  SpO2: 93% 98% 98% 98%  Weight:      Height:        Intake/Output Summary (Last 24 hours) at 06/29/2020 1352 Last data filed at 06/29/2020 8182 Gross per 24 hour  Intake 1875.9 ml  Output 2700 ml  Net -824.1 ml   Filed Weights   06/28/20 1623 06/28/20 2116  Weight: 116.6 kg 114.2 kg    Examination:  General exam: Appears calm and comfortable   Respiratory system: Clear to auscultation. Respiratory effort normal. Cardiovascular system: S1 & S2 heard, RRR. No JVD, . Gastrointestinal system: Abdomen is nondistended, soft and nontender.  Normal bowel sounds heard. Central nervous system: Alert and oriented. No focal neurological deficits. Extremities: left elbow swelling and multiple joint swelling and tenderness.  Skin: No rashes, lesions or ulcers Psychiatry: Mood & affect appropriate.     Data Reviewed: I have personally reviewed following labs and imaging studies  CBC: Recent Labs  Lab 06/28/20 1653 06/28/20 1854 06/29/20 0330  WBC 14.2*  --  11.8*  NEUTROABS 11.9*  --   --   HGB 10.1* 11.6* 9.4*  HCT 31.3* 34.0* 29.0*  MCV 75.2*  --  74.7*  PLT 183  --  993    Basic Metabolic Panel: Recent Labs  Lab 06/28/20 1653 06/28/20 1854 06/28/20 1855 06/29/20 0330  NA 131* 129* 132* 136  K 6.2* 5.7* 3.7 4.1  CL 86*  --  88* 95*  CO2 22  --  24 21*  GLUCOSE 241*  --  222* 272*  BUN 132*  --  133* 123*  CREATININE 6.68*  --  6.38* 4.92*  CALCIUM 9.0  --  8.9 9.0    GFR: Estimated Creatinine Clearance: 18.7 mL/min (A) (by C-G formula based on SCr of 4.92 mg/dL (H)).  Liver Function Tests: No results for input(s): AST, ALT, ALKPHOS, BILITOT, PROT, ALBUMIN in the last 168 hours.  CBG: Recent Labs  Lab 06/28/20 2126 06/29/20 0603 06/29/20 1121  GLUCAP 194* 293* 318*     Recent Results (from the past 240 hour(s))  SARS Coronavirus 2 by RT PCR (hospital order, performed in [facility] hospital lab) Nasopharyngeal Nasopharyngeal Swab     Status: None   Collection Time: 06/28/20  6:43 PM   Specimen: Nasopharyngeal Swab  Result Value Ref Range Status   SARS Coronavirus 2 NEGATIVE NEGATIVE Final    Comment: (NOTE) SARS-CoV-2 target nucleic acids are NOT DETECTED.  The SARS-CoV-2 RNA is generally detectable in upper and lower respiratory specimens during the acute phase of infection. The  lowest concentration of SARS-CoV-2 viral copies this assay can detect is 250 copies / mL. A negative result does not preclude SARS-CoV-2 infection and should not be used as the sole basis for treatment or other patient management decisions.  A negative result may occur with improper specimen collection / handling, submission of specimen other than nasopharyngeal swab, presence of viral mutation(s) within the areas targeted by this assay, and inadequate number of viral copies (<250 copies / mL). A negative result must be combined with clinical observations, patient history, and epidemiological information.  Fact Sheet for Patients:   StrictlyIdeas.no  Fact Sheet for Healthcare Providers: BankingDealers.co.za  This test is not yet approved or  cleared by the [place] FDA and has been authorized for detection and/or diagnosis of SARS-CoV-2 by FDA under an Emergency Use Authorization (EUA).  This  EUA will remain in effect (meaning this test can be used) for the duration of the COVID-19 declaration under Section 564(b)(1) of the Act, 21 U.S.C. section 360bbb-3(b)(1), unless the authorization is terminated or revoked sooner.  Performed at [facility], [place] [address] [place], [place] [address]          Radiology Studies: DG Chest 1 View  Result Date: 06/28/2020 CLINICAL DATA:  Chest pain. EXAM: CHEST  1 VIEW COMPARISON:  Chest x-ray dated June 15, 2011. FINDINGS: Borderline cardiomegaly. Normal pulmonary vascularity. Left basilar atelectasis. No focal consolidation, pleural effusion, or pneumothorax. No acute osseous abnormality. IMPRESSION: 1. Left basilar atelectasis. Electronically Signed   By: Titus Dubin M.D.   On: 06/28/2020 17:40   DG Elbow Complete Left  Result Date: 06/28/2020 CLINICAL DATA:  Left elbow pain and swelling after physical therapy today. No injury. EXAM: LEFT ELBOW - COMPLETE 3+ VIEW COMPARISON:   None. FINDINGS: No acute fracture or dislocation. Large joint effusion. Joint spaces are preserved. Small marginal osteophytes. Posterior elbow soft tissue swelling. IMPRESSION: 1. Large joint effusion without acute osseous abnormality. In the absence of recent trauma, this could reflect underlying crystal arthropathy given history of gout. Electronically Signed   By: Titus Dubin M.D.   On: 06/28/2020 17:42   DG Wrist Complete Left  Result Date: 06/28/2020 CLINICAL DATA:  Left hand and wrist pain and swelling after physical therapy. No injury. EXAM: LEFT HAND - COMPLETE 3+ VIEW; LEFT WRIST - COMPLETE 3+ VIEW COMPARISON:  Left hand x-rays dated May 04, 2020. FINDINGS: No acute fracture or dislocation. Suspected large erosions involving the second and third proximal phalanx heads, third metacarpal head, and capitate are unchanged. Unchanged moderate third PIP joint and mild third MCP joint space narrowing. Remaining joint spaces are preserved. Bone mineralization is normal. New diffuse soft tissue swelling of the wrist, dorsal hand, and index finger. Progressive soft tissue swelling of the long finger. IMPRESSION: 1. Diffuse soft tissue swelling. No acute osseous abnormality. 2. 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 Electronically Signed   By: Titus Dubin M.D.   On: 06/28/2020 17:48   US RENAL  Result Date: 06/28/2020 CLINICAL DATA:  Acute kidney injury EXAM: RENAL / URINARY TRACT ULTRASOUND COMPLETE COMPARISON:  None. FINDINGS: Right Kidney: Renal measurements: 10.0 x 5.6 x 3.8 cm = volume: 110 mL . Echogenicity within normal limits. No mass or hydronephrosis visualized. Left Kidney: Renal measurements: 9.6 x 5.7 x 3.7 cm = volume: 106 mL. Echogenicity within normal limits. No mass or hydronephrosis visualized. Bladder: Appears normal for degree of bladder distention. Other: None. IMPRESSION: No acute findings.  No hydronephrosis.  Electronically Signed   By: Rolm Baptise M.D.   On: 06/28/2020 20:20   DG Shoulder Left  Result Date: 06/28/2020 CLINICAL DATA:  Left shoulder pain after physical therapy today. EXAM: LEFT SHOULDER - 2+ VIEW COMPARISON:  None. FINDINGS: No acute fracture or dislocation. Mild acromioclavicular osteoarthritis. Soft tissues are unremarkable. IMPRESSION: 1. No acute osseous abnormality. Electronically Signed   By: Titus Dubin M.D.   On: 06/28/2020 17:39   DG Hand Complete Left  Result Date: 06/28/2020 CLINICAL DATA:  Left hand and wrist pain and swelling after physical therapy. No injury. EXAM: LEFT HAND - COMPLETE 3+ VIEW; LEFT WRIST - COMPLETE 3+ VIEW COMPARISON:  Left hand x-rays dated May 04, 2020. FINDINGS: No acute fracture or dislocation. Suspected large erosions involving the second and  third proximal phalanx heads, third metacarpal head, and capitate are unchanged. Unchanged moderate third PIP joint and mild third MCP joint space narrowing. Remaining joint spaces are preserved. Bone mineralization is normal. New diffuse soft tissue swelling of the wrist, dorsal hand, and index finger. Progressive soft tissue swelling of the long finger. IMPRESSION: 1. Diffuse soft tissue swelling. No acute osseous abnormality. 2. 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 Electronically Signed   By: Titus Dubin M.D.   On: 06/28/2020 17:48        Scheduled Meds: . aspirin EC  81 mg Oral Daily  . atorvastatin  40 mg Oral q1800  . Chlorhexidine Gluconate Cloth  6 each Topical Daily  . ferrous sulfate  325 mg Oral Q breakfast  . heparin  5,000 Units Subcutaneous Q12H  . insulin aspart  0-5 Units Subcutaneous QHS  . insulin aspart  0-9 Units Subcutaneous TID WC  . insulin glargine  15 Units Subcutaneous QHS  . loratadine  10 mg Oral Daily  . methylPREDNISolone (SOLU-MEDROL) injection  40 mg Intravenous Q12H  . pantoprazole  40 mg Oral  QHS  . traZODone  50 mg Oral QHS   Continuous Infusions:   LOS: 1 day        Hosie Poisson, MD [facility]   To contact the attending provider between 7A-7P or the covering provider during after hours 7P-7A, please log into the web site [email or web address] and access using universal [place] password for that web site. If you do not have the password, please call the hospital operator.  06/29/2020, 1:52 PM

## 2020-06-29 NOTE — Telephone Encounter (Signed)
Dr. Karleen Hampshire called from [facility] for Dr. Erlinda Hong. Please call Dr. Karleen Hampshire at [phone]. Sending this message as urgent.

## 2020-06-29 NOTE — Progress Notes (Signed)
CRITICAL VALUE ALERT  Critical Value:  Lactic acid 2.4  Date & Time Notied:  06/29/20 0035  Provider Notified: Dr. Myna Hidalgo  Orders Received/Actions taken: Awaiting orders

## 2020-06-29 NOTE — Progress Notes (Signed)
[facility] Progress Note   59 y.o. male with a past medical history of gout, CKD3, HTN, DM2, obesity, HLD, OA who presents with pain and weakness and labs were notable for AKI with BL Cr ~1.7 (03/13/2020).   Was on ACE/HCTZ + Lasix  Assessment/ Plan:   AKI on CKD3: Likely secondary to prolonged prerenal injury with possible rhabdomyolysis. Very well might have acute urate nephropathy as well given previous uric acid levels -underlying CKD best explained by possible diabetic nephropathy given history of albuminuria. Baseline Cr around 1.7? - CK level 8128 secondary to  prolonged immobility - Uric acid 9.3 - Renal ultrasound no e/o  obstruction - Continue holding home ACEI/HCTZ as well as home lasix  - Continue   another 1L at 125cc/hr; would stop after another liter and allow him to equilibrate if he's consuming PO's.  - RBC's in urinalysis may be from foley trauma.  -Monitor Daily I/Os, Daily weight   Gout, history of hyperuricemia  -renally dosed allopurinol  -no colchicine, would not restart thiazides  Hypotension, h/o HTN -hold home anti-HTN's, fluids as above  Hyperkalemia Normalized  Hypocalcemia -secondary to rhabdo? CK level elevated  Hypovolemic Hyponatremia Improving with isotonic fluids   Diabetes Mellitus 2  Subjective:   Pain in the left arm and keeps asking what's wrong with him Denies dyspnea but keeping mask on the protect others Denies nausea; +thirst   Objective:   BP 108/62 (BP Location: Right Arm)   Pulse 91   Temp 97.8 F (36.6 C) (Oral)   Resp 15   Ht 5\' 5"  (1.651 m)   Wt 114.2 kg   SpO2 98%   BMI 41.90 kg/m   Intake/Output Summary (Last 24 hours) at 06/29/2020 1002 Last data filed at 06/29/2020 8527 Gross per 24 hour  Intake 1875.9 ml  Output 2700 ml  Net -824.1 ml   Weight change:   Physical Exam: GEN: NAD, A&Ox3, NCAT, Morbidly obese HEENT: Conjunctival pallor, EOMI, vision intact NECK: Supple, no  thyromegaly LUNGS: CTA B/L no rales, distant BS CV: RRR ABD: SNDNT +BS  EXT: No lower extremity pitting edema    Imaging: DG Chest 1 View  Result Date: 06/28/2020 CLINICAL DATA:  Chest pain. EXAM: CHEST  1 VIEW COMPARISON:  Chest x-ray dated June 15, 2011. FINDINGS: Borderline cardiomegaly. Normal pulmonary vascularity. Left basilar atelectasis. No focal consolidation, pleural effusion, or pneumothorax. No acute osseous abnormality. IMPRESSION: 1. Left basilar atelectasis. Electronically Signed   By: Titus Dubin M.D.   On: 06/28/2020 17:40   DG Elbow Complete Left  Result Date: 06/28/2020 CLINICAL DATA:  Left elbow pain and swelling after physical therapy today. No injury. EXAM: LEFT ELBOW - COMPLETE 3+ VIEW COMPARISON:  None. FINDINGS: No acute fracture or dislocation. Large joint effusion. Joint spaces are preserved. Small marginal osteophytes. Posterior elbow soft tissue swelling. IMPRESSION: 1. Large joint effusion without acute osseous abnormality. In the absence of recent trauma, this could reflect underlying crystal arthropathy given history of gout. Electronically Signed   By: Titus Dubin M.D.   On: 06/28/2020 17:42   DG Wrist Complete Left  Result Date: 06/28/2020 CLINICAL DATA:  Left hand and wrist pain and swelling after physical therapy. No injury. EXAM: LEFT HAND - COMPLETE 3+ VIEW; LEFT WRIST - COMPLETE 3+ VIEW COMPARISON:  Left hand x-rays dated May 04, 2020. FINDINGS: No acute fracture or dislocation. Suspected large erosions involving the second and third proximal phalanx heads, third metacarpal head, and capitate are unchanged. Unchanged  moderate third PIP joint and mild third MCP joint space narrowing. Remaining joint spaces are preserved. Bone mineralization is normal. New diffuse soft tissue swelling of the wrist, dorsal hand, and index finger. Progressive soft tissue swelling of the long finger. IMPRESSION: 1. Diffuse soft tissue swelling. No acute osseous  abnormality. 2. 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 Electronically Signed   By: Titus Dubin M.D.   On: 06/28/2020 17:48   US RENAL  Result Date: 06/28/2020 CLINICAL DATA:  Acute kidney injury EXAM: RENAL / URINARY TRACT ULTRASOUND COMPLETE COMPARISON:  None. FINDINGS: Right Kidney: Renal measurements: 10.0 x 5.6 x 3.8 cm = volume: 110 mL . Echogenicity within normal limits. No mass or hydronephrosis visualized. Left Kidney: Renal measurements: 9.6 x 5.7 x 3.7 cm = volume: 106 mL. Echogenicity within normal limits. No mass or hydronephrosis visualized. Bladder: Appears normal for degree of bladder distention. Other: None. IMPRESSION: No acute findings.  No hydronephrosis. Electronically Signed   By: Rolm Baptise M.D.   On: 06/28/2020 20:20   DG Shoulder Left  Result Date: 06/28/2020 CLINICAL DATA:  Left shoulder pain after physical therapy today. EXAM: LEFT SHOULDER - 2+ VIEW COMPARISON:  None. FINDINGS: No acute fracture or dislocation. Mild acromioclavicular osteoarthritis. Soft tissues are unremarkable. IMPRESSION: 1. No acute osseous abnormality. Electronically Signed   By: Titus Dubin M.D.   On: 06/28/2020 17:39   DG Hand Complete Left  Result Date: 06/28/2020 CLINICAL DATA:  Left hand and wrist pain and swelling after physical therapy. No injury. EXAM: LEFT HAND - COMPLETE 3+ VIEW; LEFT WRIST - COMPLETE 3+ VIEW COMPARISON:  Left hand x-rays dated May 04, 2020. FINDINGS: No acute fracture or dislocation. Suspected large erosions involving the second and third proximal phalanx heads, third metacarpal head, and capitate are unchanged. Unchanged moderate third PIP joint and mild third MCP joint space narrowing. Remaining joint spaces are preserved. Bone mineralization is normal. New diffuse soft tissue swelling of the wrist, dorsal hand, and index finger. Progressive soft tissue swelling of the long finger. IMPRESSION: 1.  Diffuse soft tissue swelling. No acute osseous abnormality. 2. 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 Electronically Signed   By: Titus Dubin M.D.   On: 06/28/2020 17:48    Labs: BMET Recent Labs  Lab 06/28/20 1653 06/28/20 1854 06/28/20 1855 06/29/20 0330  NA 131* 129* 132* 136  K 6.2* 5.7* 3.7 4.1  CL 86*  --  88* 95*  CO2 22  --  24 21*  GLUCOSE 241*  --  222* 272*  BUN 132*  --  133* 123*  CREATININE 6.68*  --  6.38* 4.92*  CALCIUM 9.0  --  8.9 9.0   CBC Recent Labs  Lab 06/28/20 1653 06/28/20 1854 06/29/20 0330  WBC 14.2*  --  11.8*  NEUTROABS 11.9*  --   --   HGB 10.1* 11.6* 9.4*  HCT 31.3* 34.0* 29.0*  MCV 75.2*  --  74.7*  PLT 183  --  190    Medications:    . aspirin EC  81 mg Oral Daily  . atorvastatin  40 mg Oral q1800  . Chlorhexidine Gluconate Cloth  6 each Topical Daily  . ferrous sulfate  325 mg Oral Q breakfast  . heparin  5,000 Units Subcutaneous Q12H  . insulin aspart  0-5 Units Subcutaneous QHS  . insulin aspart  0-9 Units Subcutaneous  TID WC  . insulin aspart  5 Units Intravenous Once  . loratadine  10 mg Oral Daily  . methylPREDNISolone (SOLU-MEDROL) injection  40 mg Intravenous Q12H  . pantoprazole  40 mg Oral QHS  . traZODone  50 mg Oral QHS      Otelia Santee, MD 06/29/2020, 10:02 AM

## 2020-06-29 NOTE — Progress Notes (Signed)
Pt BS elevated Dr Cyd Silence notify see new orders.

## 2020-06-30 DIAGNOSIS — M6282 Rhabdomyolysis: Secondary | ICD-10-CM

## 2020-06-30 LAB — CBC
HCT: 29.2 % — ABNORMAL LOW (ref 39.0–52.0)
Hemoglobin: 9.7 g/dL — ABNORMAL LOW (ref 13.0–17.0)
MCH: 24.7 pg — ABNORMAL LOW (ref 26.0–34.0)
MCHC: 33.2 g/dL (ref 30.0–36.0)
MCV: 74.3 fL — ABNORMAL LOW (ref 80.0–100.0)
Platelets: 233 10*3/uL (ref 150–400)
RBC: 3.93 MIL/uL — ABNORMAL LOW (ref 4.22–5.81)
RDW: 16.9 % — ABNORMAL HIGH (ref 11.5–15.5)
WBC: 10.5 10*3/uL (ref 4.0–10.5)
nRBC: 0 % (ref 0.0–0.2)

## 2020-06-30 LAB — COMPREHENSIVE METABOLIC PANEL
ALT: 66 U/L — ABNORMAL HIGH (ref 0–44)
AST: 151 U/L — ABNORMAL HIGH (ref 15–41)
Albumin: 2.5 g/dL — ABNORMAL LOW (ref 3.5–5.0)
Alkaline Phosphatase: 69 U/L (ref 38–126)
Anion gap: 14 (ref 5–15)
BUN: 111 mg/dL — ABNORMAL HIGH (ref 6–20)
CO2: 25 mmol/L (ref 22–32)
Calcium: 9.6 mg/dL (ref 8.9–10.3)
Chloride: 99 mmol/L (ref 98–111)
Creatinine, Ser: 2.69 mg/dL — ABNORMAL HIGH (ref 0.61–1.24)
GFR calc Af Amer: 29 mL/min — ABNORMAL LOW (ref >60)
GFR calc non Af Amer: 25 mL/min — ABNORMAL LOW (ref >60)
Glucose, Bld: 451 mg/dL — ABNORMAL HIGH (ref 70–99)
Potassium: 3.6 mmol/L (ref 3.5–5.1)
Sodium: 138 mmol/L (ref 135–145)
Total Bilirubin: 0.8 mg/dL (ref 0.3–1.2)
Total Protein: 7.1 g/dL (ref 6.5–8.1)

## 2020-06-30 LAB — GLUCOSE, CAPILLARY
Glucose-Capillary: 411 mg/dL — ABNORMAL HIGH (ref 70–99)
Glucose-Capillary: 437 mg/dL — ABNORMAL HIGH (ref 70–99)
Glucose-Capillary: 457 mg/dL — ABNORMAL HIGH (ref 70–99)
Glucose-Capillary: 321 mg/dL — ABNORMAL HIGH (ref 70–99)

## 2020-06-30 LAB — CK: Total CK: 3618 U/L — ABNORMAL HIGH (ref 49–397)

## 2020-06-30 MED ORDER — INSULIN GLARGINE 100 UNIT/ML ~~LOC~~ SOLN
18.0000 [IU] | Freq: Every day | SUBCUTANEOUS | Status: DC
  Filled 2020-06-30: qty 0.18

## 2020-06-30 MED ORDER — PREDNISONE 20 MG PO TABS
40.0000 mg | ORAL_TABLET | Freq: Every day | ORAL | Status: AC
  Administered 2020-06-30 – 2020-07-02 (×3): 40 mg via ORAL
  Filled 2020-06-30 (×3): qty 2

## 2020-06-30 MED ORDER — INSULIN ASPART 100 UNIT/ML ~~LOC~~ SOLN
0.0000 [IU] | Freq: Three times a day (TID) | SUBCUTANEOUS | Status: DC
  Administered 2020-06-30: 20 [IU] via SUBCUTANEOUS
  Administered 2020-06-30: 15 [IU] via SUBCUTANEOUS
  Administered 2020-07-01: 20 [IU] via SUBCUTANEOUS
  Administered 2020-07-01 (×2): 11 [IU] via SUBCUTANEOUS
  Administered 2020-07-02: 4 [IU] via SUBCUTANEOUS
  Administered 2020-07-02: 7 [IU] via SUBCUTANEOUS
  Administered 2020-07-02: 3 [IU] via SUBCUTANEOUS
  Administered 2020-07-03: 11 [IU] via SUBCUTANEOUS
  Administered 2020-07-03 – 2020-07-04 (×2): 3 [IU] via SUBCUTANEOUS
  Administered 2020-07-04: 11 [IU] via SUBCUTANEOUS
  Administered 2020-07-04: 4 [IU] via SUBCUTANEOUS
  Administered 2020-07-05: 11 [IU] via SUBCUTANEOUS
  Administered 2020-07-05 – 2020-07-06 (×2): 4 [IU] via SUBCUTANEOUS
  Administered 2020-07-06 – 2020-07-07 (×2): 7 [IU] via SUBCUTANEOUS
  Administered 2020-07-07: 3 [IU] via SUBCUTANEOUS
  Administered 2020-07-08 (×2): 4 [IU] via SUBCUTANEOUS
  Administered 2020-07-08: 3 [IU] via SUBCUTANEOUS
  Administered 2020-07-09: 4 [IU] via SUBCUTANEOUS
  Administered 2020-07-10 (×2): 3 [IU] via SUBCUTANEOUS

## 2020-06-30 MED ORDER — INSULIN GLARGINE 100 UNIT/ML ~~LOC~~ SOLN
18.0000 [IU] | Freq: Two times a day (BID) | SUBCUTANEOUS | Status: DC
  Administered 2020-07-01 (×2): 18 [IU] via SUBCUTANEOUS
  Filled 2020-06-30 (×3): qty 0.18

## 2020-06-30 MED ORDER — INSULIN ASPART 100 UNIT/ML ~~LOC~~ SOLN
0.0000 [IU] | Freq: Every day | SUBCUTANEOUS | Status: DC
  Administered 2020-07-01: 2 [IU] via SUBCUTANEOUS
  Administered 2020-07-01: 4 [IU] via SUBCUTANEOUS
  Administered 2020-07-02: 2 [IU] via SUBCUTANEOUS
  Administered 2020-07-03 – 2020-07-04 (×2): 3 [IU] via SUBCUTANEOUS
  Administered 2020-07-05: 4 [IU] via SUBCUTANEOUS
  Administered 2020-07-06 – 2020-07-08 (×2): 2 [IU] via SUBCUTANEOUS

## 2020-06-30 MED ORDER — INSULIN ASPART 100 UNIT/ML ~~LOC~~ SOLN
15.0000 [IU] | Freq: Once | SUBCUTANEOUS | Status: AC
  Administered 2020-06-30: 15 [IU] via SUBCUTANEOUS

## 2020-06-30 NOTE — Evaluation (Signed)
Physical Therapy Evaluation Patient Details Name: [name] MRN: [id number] DOB: 12/20/1960 Today's Date: 06/30/2020   History of Present Illness  59 y.o. male presenting with change in mentation and worsening of multiple joint pain. Patient admitted with AKI on CKD II likely ATN, acute hyperkalemia, toxic encephalopathy, and acute gouty flare-up. PMHx significant for chronic gouty arthritis of multiple joints, CKD II, IDDM, HTN, HLD, and chronic anemia.   Clinical Impression  Pt was seen for movement on side of bed to chair, with cues for safety and with mult devices including HHA used.  Pt does not demonstrate good tolerance for regular AD, and will work on determining a better option than RW.    Follow Up Recommendations SNF    Equipment Recommendations  Rolling walker with 5" wheels    Recommendations for Other Services       Precautions / Restrictions Precautions Precautions: Fall Precaution Comments: Injury to L elbow during HHPT Restrictions Weight Bearing Restrictions: Yes LUE Weight Bearing: Weight bearing as tolerated      Mobility  Bed Mobility Overal bed mobility: Needs Assistance Bed Mobility: Supine to Sit     Supine to sit: Mod assist     General bed mobility comments: mod to sit up on side of bed with full sequencing cues  Transfers Overall transfer level: Needs assistance Equipment used: Rolling walker (2 wheeled);1 person hand held assist Transfers: Sit to/from [facility] Sit to Stand: Mod assist Stand pivot transfers: Mod assist       General transfer comment: mod assist to support standing effort with mult attempts before getting to chair  Ambulation/Gait             General Gait Details: unable to attempt  Stairs            Wheelchair Mobility    Modified Rankin (Stroke Patients Only)       Balance Overall balance assessment: Needs assistance Sitting-balance support: Feet supported Sitting  balance-Leahy Scale: Fair     Standing balance support: Bilateral upper extremity supported;During functional activity Standing balance-Leahy Scale: Poor Standing balance comment: requires furniture, support to succeed                             Pertinent Vitals/Pain Pain Assessment: Faces Faces Pain Scale: Hurts even more Pain Location: L shoulder and elbow Pain Descriptors / Indicators: Burning Pain Intervention(s): Limited activity within patient's tolerance;Monitored during session;Premedicated before session;Repositioned    Home Living Family/patient expects to be discharged to:: Private residence Living Arrangements: Spouse/significant other Available Help at Discharge: Family;Available PRN/intermittently Type of Home: Apartment Home Access: Level entry     Home Layout: One level        Prior Function Level of Independence: Needs assistance   Gait / Transfers Assistance Needed: Ambulating with use RW in home and use of motorized scooters when available at the grocery store.   ADL's / Homemaking Assistance Needed: Independent with BADLs including bathing, dressing, and toileting. Patient was requiring assist with shower transfers. Patient does not drive reporting taking a taxi to the grocery store.        Hand Dominance   Dominant Hand: Right    Extremity/Trunk Assessment   Upper Extremity Assessment Upper Extremity Assessment: Defer to OT evaluation    Lower Extremity Assessment Lower Extremity Assessment: Generalized weakness    Cervical / Trunk Assessment Cervical / Trunk Assessment: Normal  Communication   Communication: No difficulties  Cognition Arousal/Alertness: Awake/alert Behavior During Therapy: WFL for tasks assessed/performed Overall Cognitive Status: History of cognitive impairments - at baseline                                        General Comments General comments (skin integrity, edema, etc.): has LE  edema, has LUE pain    Exercises     Assessment/Plan    PT Assessment Patient needs continued PT services  PT Problem List Decreased strength;Decreased range of motion;Decreased activity tolerance;Decreased balance;Decreased mobility;Decreased coordination;Decreased cognition;Decreased knowledge of use of DME;Decreased safety awareness;Cardiopulmonary status limiting activity;Obesity;Decreased skin integrity;Pain       PT Treatment Interventions DME instruction;Gait training;Stair training;Functional mobility training;Therapeutic activities;Therapeutic exercise;Balance training;Neuromuscular re-education;Patient/family education    PT Goals (Current goals can be found in the Care Plan section)  Acute Rehab PT Goals Patient Stated Goal: Patient considering SNF rehab placement.  PT Goal Formulation: With patient Time For Goal Achievement: 07/14/20 Potential to Achieve Goals: Good    Frequency Min 3X/week   Barriers to discharge Inaccessible home environment;Decreased caregiver support family are not home all the time    Co-evaluation               AM-PAC PT "6 Clicks" Mobility  Outcome Measure Help needed turning from your back to your side while in a flat bed without using bedrails?: A Little Help needed moving from lying on your back to sitting on the side of a flat bed without using bedrails?: A Lot Help needed moving to and from a bed to a chair (including a wheelchair)?: A Lot Help needed standing up from a chair using your arms (e.g., wheelchair or bedside chair)?: A Lot Help needed to walk in hospital room?: A Lot Help needed climbing 3-5 steps with a railing? : Total 6 Click Score: 12    End of Session Equipment Utilized During Treatment: Gait belt;Oxygen Activity Tolerance: Treatment limited secondary to medical complications (Comment);Patient limited by pain Patient left: in chair;with call bell/phone within reach;with chair alarm set;with nursing/sitter in  room Nurse Communication: Mobility status PT Visit Diagnosis: Unsteadiness on feet (R26.81);Muscle weakness (generalized) (M62.81);Difficulty in walking, not elsewhere classified (R26.2);Pain Pain - Right/Left: Left Pain - part of body: Arm    Time: 7893-8101 PT Time Calculation (min) (ACUTE ONLY): 47 min   Charges:   PT Evaluation $PT Eval Moderate Complexity: 1 Mod PT Treatments $Therapeutic Activity: 23-37 mins       Ramond Dial 06/30/2020, 10:51 PM Mee Hives, PT MS Acute Rehab Dept. Number: [facility]

## 2020-06-30 NOTE — Progress Notes (Signed)
PROGRESS NOTE    [name]  XIP:[id number] DOB: 1961/06/10 DOA: 06/28/2020 PCP: Charlott Rakes, MD    Chief Complaint  Patient presents with  . Hand Pain  . Weakness    Brief Narrative:  59 year old gentleman with prior history of chronic gouty arthritis of multiple joint, stage II CKD, insulin-dependent diabetes mellitus, hypertension, hyperlipidemia, anemia of chronic disease, iron deficiency anemia, presents to ED for lethargy and multijoint pain. As per the sister-in-law at bedside patient was unable to stand or walk and was brought to ED.  He was found to be in AKI, hyperkalemic and in rhabdomyolysis. He was started on aggressive hydration and his creatinine has improved from 6.38 to 2.69. orthopedics consulted for his left elbow swelling and elbow joint effusion. Currently pt is on steroids for the gouty flare up of the multiple joints. He was also found to have guiac positive stools. GI consulted, recommended outpatient colonoscopy and EGD .  Pt seen and examined at bedside. reports the elbow pain is improving but not resolved yet.   Assessment & Plan:   Active Problems:   AKI (acute kidney injury) ([place])   Arthralgia   Left elbow pain   Left hand pain   Non-traumatic rhabdomyolysis   AKI on stage II CKD Probably secondary to volume depletion/renal hypoperfusion. Started the patient on IV fluid resuscitation and renal parameters have improved. He was started on aggressive hydration and his creatinine has improved from 6.38 to 2.69.  Nephrology consulted and recommendations given to continue with fluids.  Ultrasound renal did not show any hydronephrosis at this time.    Acute hyperkalemia No EKG changes.  Improved with hydration.  Repeat potassium level  Is wnl.   Toxic encephalopathy probably uremic in nature slowly improving with improvement in renal parameters. Resolved.    Acute gouty flareup Patient was started on IV Solu-Medrol, transitioned to oral  prednisone today.   Continue to hold allopurinol, colchicine and hydrochlorothiazide at this time.    Insulin-dependent diabetes mellitus, uncontrolled with hyperglycemia probably secondary to IV steroids CBG (last 3)  Recent Labs    06/30/20 0630 06/30/20 0849 06/30/20 1144  GLUCAP 411* 437* 457*   Change to resistant scale SSI.   a1C IS 7.1 Add NovoLog 4 units 3 times daily AC.   Essential hypertension Well controlled.    Iron deficiency anemia, anemia of chronic disease Baseline hemoglobin around 11 in 2020 November.  Slow decline to 9.4 in the last 8 months. Stool for occult blood is positive. Transfuse to keep hemoglobin greater than seven.  Continue to monitor. GI consulted to see if he needs a colonoscopy or EGD, recommended outpatient follow upw ith GI.     Elevated lactic acid Probably secondary to dehydration, normalized with IV fluids.   Nontraumatic rhabdomyolysis with AKI, anion gap metabolic acidosis Improving with IV fluids. CK levels are improving.  metabolic acidosis improving.   Fever Probably secondary to acute gouty flareup of multiple joints Blood cultures have been negative so far. Urine analysis is negative for infection. Chest x-ray does not show an acute cardiopulmonary abnormalities    DVT prophylaxis: (Heparin) Code Status: full code.  Family Communication: none at bedside.  Disposition:   Status is: Inpatient  Remains inpatient appropriate because:IV treatments appropriate due to intensity of illness or inability to take PO   Dispo: The patient is from: Home              Anticipated d/c is to: pending.  Anticipated d/c date is: > 3 days              Patient currently is not medically stable to d/c.       Consultants:   Orthopedics  Nephrology  Gastroenterology.    Procedures: none.   Antimicrobials: none.   Subjective: Left elbow pain is improving, but not resolved . Reports not having a  colonoscopy done in the past.  No chest pain or sob.  Other joint pains have improved.   Objective: Vitals:   06/29/20 2328 06/30/20 0332 06/30/20 1040 06/30/20 1140  BP: 116/77 130/78 115/83 115/82  Pulse: 78 85 88 85  Resp: 17 18 (!) 21 15  Temp: 98 F (36.7 C) 97.9 F (36.6 C) 98.8 F (37.1 C)   TempSrc: Oral Oral Oral   SpO2: 98% 96% 97% 98%  Weight:      Height:        Intake/Output Summary (Last 24 hours) at 06/30/2020 1627 Last data filed at 06/30/2020 1149 Gross per 24 hour  Intake 240 ml  Output 4400 ml  Net -4160 ml   Filed Weights   06/28/20 1623 06/28/20 2116  Weight: 116.6 kg 114.2 kg    Examination:  General exam: alert and comfortable.  Respiratory system: clear to auscultation, no wheezing or rhonchi.  Cardiovascular system: S1S2 RRR no jVD, pedal edema present.  Gastrointestinal system: Abdomen is soft non tender non distended bowel sounds wnl.  Central nervous system: alert and oriented, non focal.  Extremities: left elbow swelling persistent, tenderness improved. Pedal edema present.  Skin: no rashes.  Psychiatry: anxious.      Data Reviewed: I have personally reviewed following labs and imaging studies  CBC: Recent Labs  Lab 06/28/20 1653 06/28/20 1854 06/29/20 0330 06/30/20 0223  WBC 14.2*  --  11.8* 10.5  NEUTROABS 11.9*  --   --   --   HGB 10.1* 11.6* 9.4* 9.7*  HCT 31.3* 34.0* 29.0* 29.2*  MCV 75.2*  --  74.7* 74.3*  PLT 183  --  190 144    Basic Metabolic Panel: Recent Labs  Lab 06/28/20 1653 06/28/20 1854 06/28/20 1855 06/29/20 0330 06/30/20 0223  NA 131* 129* 132* 136 138  K 6.2* 5.7* 3.7 4.1 3.6  CL 86*  --  88* 95* 99  CO2 22  --  24 21* 25  GLUCOSE 241*  --  222* 272* 451*  BUN 132*  --  133* 123* 111*  CREATININE 6.68*  --  6.38* 4.92* 2.69*  CALCIUM 9.0  --  8.9 9.0 9.6    GFR: Estimated Creatinine Clearance: 34.1 mL/min (A) (by C-G formula based on SCr of 2.69 mg/dL (H)).  Liver Function Tests: Recent  Labs  Lab 06/30/20 0223  AST 151*  ALT 66*  ALKPHOS 69  BILITOT 0.8  PROT 7.1  ALBUMIN 2.5*    CBG: Recent Labs  Lab 06/29/20 2248 06/29/20 2334 06/30/20 0630 06/30/20 0849 06/30/20 1144  GLUCAP 440* 467* 411* 437* 457*     Recent Results (from the past 240 hour(s))  SARS Coronavirus 2 by RT PCR (hospital order, performed in [facility] hospital lab) Nasopharyngeal Nasopharyngeal Swab     Status: None   Collection Time: 06/28/20  6:43 PM   Specimen: Nasopharyngeal Swab  Result Value Ref Range Status   SARS Coronavirus 2 NEGATIVE NEGATIVE Final    Comment: (NOTE) SARS-CoV-2 target nucleic acids are NOT DETECTED.  The SARS-CoV-2 RNA is generally detectable in upper and  lower respiratory specimens during the acute phase of infection. The lowest concentration of SARS-CoV-2 viral copies this assay can detect is 250 copies / mL. A negative result does not preclude SARS-CoV-2 infection and should not be used as the sole basis for treatment or other patient management decisions.  A negative result may occur with improper specimen collection / handling, submission of specimen other than nasopharyngeal swab, presence of viral mutation(s) within the areas targeted by this assay, and inadequate number of viral copies (<250 copies / mL). A negative result must be combined with clinical observations, patient history, and epidemiological information.  Fact Sheet for Patients:   StrictlyIdeas.no  Fact Sheet for Healthcare Providers: BankingDealers.co.za  This test is not yet approved or  cleared by the [place] FDA and has been authorized for detection and/or diagnosis of SARS-CoV-2 by FDA under an Emergency Use Authorization (EUA).  This EUA will remain in effect (meaning this test can be used) for the duration of the COVID-19 declaration under Section 564(b)(1) of the Act, 21 U.S.C. section 360bbb-3(b)(1), unless the  authorization is terminated or revoked sooner.  Performed at [facility], [place] [address] [place], [place] [address]   Culture, Urine     Status: None   Collection Time: 06/28/20 10:00 PM   Specimen: Urine, Catheterized  Result Value Ref Range Status   Specimen Description URINE, CATHETERIZED  Final   Special Requests NONE  Final   Culture   Final    NO GROWTH Performed at [facility] [address] [place], [place] [address]    Report Status 06/29/2020 FINAL  Final  Culture, blood (routine x 2)     Status: None (Preliminary result)   Collection Time: 06/28/20 11:51 PM   Specimen: BLOOD RIGHT HAND  Result Value Ref Range Status   Specimen Description BLOOD RIGHT HAND  Final   Special Requests   Final    BOTTLES DRAWN AEROBIC AND ANAEROBIC Blood Culture adequate volume   Culture   Final    NO GROWTH 1 DAY Performed at [facility], [place] [address], [place], [place] [address]    Report Status PENDING  Incomplete  Culture, blood (routine x 2)     Status: None (Preliminary result)   Collection Time: 06/29/20  3:29 AM   Specimen: BLOOD RIGHT HAND  Result Value Ref Range Status   Specimen Description BLOOD RIGHT HAND  Final   Special Requests   Final    BOTTLES DRAWN AEROBIC AND ANAEROBIC Blood Culture adequate volume   Culture   Final    NO GROWTH 1 DAY Performed at [facility], [place] [address] [place], [place] [address]    Report Status PENDING  Incomplete         Radiology Studies: DG Chest 1 View  Result Date: 06/28/2020 CLINICAL DATA:  Chest pain. EXAM: CHEST  1 VIEW COMPARISON:  Chest x-ray dated June 15, 2011. FINDINGS: Borderline cardiomegaly. Normal pulmonary vascularity. Left basilar atelectasis. No focal consolidation, pleural effusion, or pneumothorax. No acute osseous abnormality. IMPRESSION: 1. Left basilar atelectasis. Electronically Signed   By: Titus Dubin M.D.   On: 06/28/2020 17:40   DG Elbow Complete Left  Result  Date: 06/28/2020 CLINICAL DATA:  Left elbow pain and swelling after physical therapy today. No injury. EXAM: LEFT ELBOW - COMPLETE 3+ VIEW COMPARISON:  None. FINDINGS: No acute fracture or dislocation. Large joint effusion. Joint spaces are preserved. Small marginal osteophytes. Posterior elbow soft  tissue swelling. IMPRESSION: 1. Large joint effusion without acute osseous abnormality. In the absence of recent trauma, this could reflect underlying crystal arthropathy given history of gout. Electronically Signed   By: Titus Dubin M.D.   On: 06/28/2020 17:42   DG Wrist Complete Left  Result Date: 06/28/2020 CLINICAL DATA:  Left hand and wrist pain and swelling after physical therapy. No injury. EXAM: LEFT HAND - COMPLETE 3+ VIEW; LEFT WRIST - COMPLETE 3+ VIEW COMPARISON:  Left hand x-rays dated May 04, 2020. FINDINGS: No acute fracture or dislocation. Suspected large erosions involving the second and third proximal phalanx heads, third metacarpal head, and capitate are unchanged. Unchanged moderate third PIP joint and mild third MCP joint space narrowing. Remaining joint spaces are preserved. Bone mineralization is normal. New diffuse soft tissue swelling of the wrist, dorsal hand, and index finger. Progressive soft tissue swelling of the long finger. IMPRESSION: 1. Diffuse soft tissue swelling. No acute osseous abnormality. 2. 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 Electronically Signed   By: Titus Dubin M.D.   On: 06/28/2020 17:48   US RENAL  Result Date: 06/28/2020 CLINICAL DATA:  Acute kidney injury EXAM: RENAL / URINARY TRACT ULTRASOUND COMPLETE COMPARISON:  None. FINDINGS: Right Kidney: Renal measurements: 10.0 x 5.6 x 3.8 cm = volume: 110 mL . Echogenicity within normal limits. No mass or hydronephrosis visualized. Left Kidney: Renal measurements: 9.6 x 5.7 x 3.7 cm = volume: 106 mL. Echogenicity within normal limits. No mass or  hydronephrosis visualized. Bladder: Appears normal for degree of bladder distention. Other: None. IMPRESSION: No acute findings.  No hydronephrosis. Electronically Signed   By: Rolm Baptise M.D.   On: 06/28/2020 20:20   DG Shoulder Left  Result Date: 06/28/2020 CLINICAL DATA:  Left shoulder pain after physical therapy today. EXAM: LEFT SHOULDER - 2+ VIEW COMPARISON:  None. FINDINGS: No acute fracture or dislocation. Mild acromioclavicular osteoarthritis. Soft tissues are unremarkable. IMPRESSION: 1. No acute osseous abnormality. Electronically Signed   By: Titus Dubin M.D.   On: 06/28/2020 17:39   DG Hand Complete Left  Result Date: 06/28/2020 CLINICAL DATA:  Left hand and wrist pain and swelling after physical therapy. No injury. EXAM: LEFT HAND - COMPLETE 3+ VIEW; LEFT WRIST - COMPLETE 3+ VIEW COMPARISON:  Left hand x-rays dated May 04, 2020. FINDINGS: No acute fracture or dislocation. Suspected large erosions involving the second and third proximal phalanx heads, third metacarpal head, and capitate are unchanged. Unchanged moderate third PIP joint and mild third MCP joint space narrowing. Remaining joint spaces are preserved. Bone mineralization is normal. New diffuse soft tissue swelling of the wrist, dorsal hand, and index finger. Progressive soft tissue swelling of the long finger. IMPRESSION: 1. Diffuse soft tissue swelling. No acute osseous abnormality. 2. 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 Electronically Signed   By: Titus Dubin M.D.   On: 06/28/2020 17:48        Scheduled Meds: . aspirin EC  81 mg Oral Daily  . Chlorhexidine Gluconate Cloth  6 each Topical Daily  . ferrous sulfate  325 mg Oral Q breakfast  . heparin  5,000 Units Subcutaneous Q12H  . insulin aspart  0-20 Units Subcutaneous TID WC  . insulin aspart  0-5 Units Subcutaneous QHS  . insulin glargine  18 Units Subcutaneous QHS  . loratadine  10  mg Oral Daily  . pantoprazole  40 mg Oral QHS  . predniSONE  40 mg Oral QAC breakfast  . traZODone  50 mg Oral QHS   Continuous Infusions:   LOS: 2 days        Hosie Poisson, MD [facility]   To contact the attending provider between 7A-7P or the covering provider during after hours 7P-7A, please log into the web site [email or web address] and access using universal [place] password for that web site. If you do not have the password, please call the hospital operator.  06/30/2020, 4:27 PM

## 2020-06-30 NOTE — Progress Notes (Signed)
Inpatient Diabetes Program Recommendations  AACE/ADA: New Consensus Statement on Inpatient Glycemic Control (2015)  Target Ranges:  Prepandial:   less than 140 mg/dL      Peak postprandial:   less than 180 mg/dL (1-2 hours)      Critically ill patients:  140 - 180 mg/dL   Lab Results  Component Value Date   GLUCAP 437 (H) 06/30/2020   HGBA1C 7.1 (H) 06/28/2020    Review of Glycemic Control Results for QUINTAVIUS, NIEBUHR (MRN [id number]) as of 06/30/2020 10:06  Ref. Range 06/29/2020 11:21 06/29/2020 16:10 06/29/2020 20:43 06/29/2020 21:30 06/29/2020 22:48 06/29/2020 23:34 06/30/2020 06:30 06/30/2020 08:49  Glucose-Capillary Latest Ref Range: 70 - 99 mg/dL 318 (H) 348 (H) 419 (H) 430 (H) 440 (H) 467 (H) 411 (H) 437 (H)  Diabetes history: DM2  Outpatient Diabetes medications:  70/30 50 units BID  Victoza 1.2 mg daily  Metformin 1000 mg BID  Current orders for Inpatient glycemic control:  Novolog 0-20 units TID  Novolog 0-5 units QHS  Prednisone 40 units daily  Lantus 18 QHS  Inpatient Diabetes Program Recommendations:     Lantus 20 units BID; Please given dose now and again tonight  Will continue to follow while inpatient.  Thank you, Reche Dixon, RN, BSN Diabetes Coordinator Inpatient Diabetes Program [phone] (team pager from 8a-5p)

## 2020-06-30 NOTE — Evaluation (Signed)
Occupational Therapy Evaluation Patient Details Name: [name] MRN: [id number] DOB: 07-19-1961 Today's Date: 06/30/2020    History of Present Illness 59 y.o. male presenting with change in mentation and worsening of multiple joint pain. Patient admitted with AKI on CKD II likely ATN, acute hyperkalemia, toxic encephalopathy, and acute gouty flare-up. PMHx significant for chronic gouty arthritis of multiple joints, CKD II, IDDM, HTN, HLD, and chronic anemia.    Clinical Impression   PTA, patient was living with his girlfriend in a ground level apartment and was grossly Mod I with BADLs/IADLs and supervision A for shower transfers. Patient also reports ambulating short-distances in home with use of RW at baseline and use of motorized carts when available at the grocery store. Patient currently functioning below baseline requiring Mod A to Max A for LB BADLs, functional mobility, and transfers. During OT evaluation, patient able to take 4 forward steps with use of RW and 8/10 pain in BLE with mobility. Patient also limited by decreased AROM in LUE 2/2 previous unspecified injury, decreased static/dynamic standing balance, and increased need for supplemental O2. Given current deficits and decreased caregiver support, recommendation for short-term SNF rehab to maximize safety and independence with self-care tasks, functional transfers, and mobility prior to d/c home. OT will continue to follow acutely.     Follow Up Recommendations  SNF    Equipment Recommendations  Other (comment) (Defer to next level of care. )    Recommendations for Other Services       Precautions / Restrictions Precautions Precautions: Fall Precaution Comments: Injury to L elbow during HHPT Restrictions Weight Bearing Restrictions: Yes LUE Weight Bearing: Weight bearing as tolerated      Mobility Bed Mobility Overal bed mobility: Needs Assistance             General bed mobility comments: Patient seated  in recliner upon entry.   Transfers Overall transfer level: Needs assistance Equipment used: Rolling walker (2 wheeled) Transfers: Sit to/from Stand Sit to Stand: Mod assist         General transfer comment: Mod A for STS from recliner with cues for safety with RW. Patient demonstrates very wide stance.    Balance Overall balance assessment: Needs assistance Sitting-balance support: Feet supported Sitting balance-Leahy Scale: Fair     Standing balance support: Bilateral upper extremity supported;During functional activity Standing balance-Leahy Scale: Poor Standing balance comment: Heavy reliance on BUE                           ADL either performed or assessed with clinical judgement   ADL Overall ADL's : Needs assistance/impaired     Grooming: Minimal assistance;Sitting           Upper Body Dressing : Moderate assistance;Sitting Upper Body Dressing Details (indicate cue type and reason): To don posterior hospital gown seated in recliner  Lower Body Dressing: Maximal assistance;Sit to/from stand;Sitting/lateral leans Lower Body Dressing Details (indicate cue type and reason): Max A to doff/don footwear seated EOB.  Toilet Transfer: Maximal assistance;BSC;RW           Functional mobility during ADLs: Maximal assistance;+2 for physical assistance;+2 for safety/equipment       Vision Baseline Vision/History: Wears glasses Wears Glasses: At all times Patient Visual Report: No change from baseline Vision Assessment?: No apparent visual deficits     Perception     Praxis      Pertinent Vitals/Pain       Hand Dominance  Right   Extremity/Trunk Assessment Upper Extremity Assessment Upper Extremity Assessment: LUE deficits/detail LUE Deficits / Details: Swelling throughout limb greatest in wrist/digits 2/2 previous injury. Unable to assess PROM/AROM 2/2 pain.  LUE: Unable to fully assess due to pain LUE Sensation: WNL LUE Coordination:  decreased fine motor;decreased gross motor   Lower Extremity Assessment Lower Extremity Assessment: Defer to PT evaluation   Cervical / Trunk Assessment Cervical / Trunk Assessment: Normal   Communication Communication Communication: No difficulties   Cognition Arousal/Alertness: Awake/alert Behavior During Therapy: WFL for tasks assessed/performed Overall Cognitive Status: History of cognitive impairments - at baseline                                     General Comments  Patient with BLE and LUE +2 edema.    Exercises     Shoulder Instructions      Home Living Family/patient expects to be discharged to:: Private residence Living Arrangements: Spouse/significant other (Girlfriend works during the day) Available Help at Discharge: Family;Available PRN/intermittently Type of Home: Apartment Home Access: Level entry     Home Layout: One level     Bathroom Shower/Tub: Tub/shower unit                    Prior Functioning/Environment Level of Independence: Needs assistance  Gait / Transfers Assistance Needed: Ambulating with use RW in home and use of motorized scooters when available at the grocery store.  ADL's / Homemaking Assistance Needed: Independent with BADLs including bathing, dressing, and toileting. Patient was requiring assist with shower transfers. Patient does not drive reporting taking a taxi to the grocery store.            OT Problem List: Decreased strength;Decreased range of motion;Decreased activity tolerance;Impaired balance (sitting and/or standing);Decreased coordination;Decreased safety awareness;Decreased knowledge of use of DME or AE;Decreased cognition;Cardiopulmonary status limiting activity;Obesity;Impaired UE functional use;Pain;Increased edema      OT Treatment/Interventions: Self-care/ADL training;Therapeutic exercise;Energy conservation;DME and/or AE instruction;Therapeutic activities;Patient/family education;Balance  training    OT Goals(Current goals can be found in the care plan section) Acute Rehab OT Goals Patient Stated Goal: Patient considering SNF rehab placement.  OT Goal Formulation: With patient Time For Goal Achievement: 07/14/20 Potential to Achieve Goals: Good ADL Goals Pt Will Perform Grooming: with modified independence;standing Pt Will Perform Lower Body Bathing: with adaptive equipment;sitting/lateral leans;sit to/from stand;with supervision Pt Will Perform Lower Body Dressing: with adaptive equipment;sitting/lateral leans;sit to/from stand;with supervision Pt Will Transfer to Toilet: with supervision;ambulating;bedside commode Pt Will Perform Toileting - Clothing Manipulation and hygiene: with modified independence;sitting/lateral leans;sit to/from stand Pt/caregiver will Perform Home Exercise Program: Increased ROM;Left upper extremity;With written HEP provided Additional ADL Goal #1: Patient will demonstrate safety with RW during BADLs.  OT Frequency: Min 2X/week   Barriers to D/C: Decreased caregiver support          Co-evaluation              AM-PAC OT "6 Clicks" Daily Activity     Outcome Measure Help from another person eating meals?: A Little Help from another person taking care of personal grooming?: A Little Help from another person toileting, which includes using toliet, bedpan, or urinal?: Total Help from another person bathing (including washing, rinsing, drying)?: A Lot Help from another person to put on and taking off regular upper body clothing?: A Little Help from another person to put on and taking off regular  lower body clothing?: Total 6 Click Score: 13   End of Session Equipment Utilized During Treatment: Gait belt;Rolling walker;Oxygen (5L via [employer])  Activity Tolerance: Patient limited by pain Patient left: in chair;with call bell/phone within reach;with chair alarm set  OT Visit Diagnosis: Unsteadiness on feet (R26.81);Muscle weakness (generalized)  (M62.81);Pain Pain - Right/Left:  (BLE) Pain - part of body: Leg;Ankle and joints of foot                Time: 6578-4696 OT Time Calculation (min): 30 min Charges:  OT General Charges $OT Visit: 1 Visit OT Evaluation $OT Eval Moderate Complexity: 1 Mod OT Treatments $Self Care/Home Management : 8-22 mins  Osman Calzadilla H. OTR/L Supplemental OT, Department of rehab services [phone]  Laurabelle Gorczyca R H. 06/30/2020, 2:49 PM

## 2020-06-30 NOTE — Progress Notes (Signed)
[facility] Progress Note   59 y.o.malewith a past medical history of gout, CKD3, HTN, DM2, obesity, HLD, OA who presents with pain and weakness and labs werenotable for AKI with BL Cr ~1.7 (03/13/2020).  Was on ACE/HCTZ + Lasix  Assessment/ Plan:   AKI on CKD3:Likely secondary to prolonged prerenal injury with possible rhabdomyolysis. Very well might have acute urate nephropathy as well given previous uric acid levels -underlying CKD best explained by possible diabetic nephropathy given historyof albuminuria. Baseline Cr around 1.7? - CK level 8128 secondary to  prolonged immobility - Uric acid 9.3 - RBC's in urinalysis may be from foley trauma; less concerning with the improving renal function.  - Renal ultrasound no e/o  obstruction - Continue holding home ACEI/HCTZ as well as home lasix -> eventually will need to restart the lasix but I would hold for another 24-48hrs.  Renal function improving with great UOP; partially from the hyperglycemia acting as an osmotic agent butmore from diuretic phase of ATN.  Will sign off at this time; please reconsult as needed.   Gout, history of hyperuricemia  -renally dosed allopurinol vs rasburicase  -no colchicine, would not restart thiazides  Hypotension, h/o HTN -hold home anti-HTN's, fluids as above  Hyperkalemia Normalized  Hypocalcemia -secondary to rhabdo? CK level elevated  Hypovolemic Hyponatremia Improving with isotonic fluids   Diabetes Mellitus 2  Subjective:   C/o thirst and body aches but denies n/v/ dyspnea, FS has been high.   Objective:   BP 130/78 (BP Location: Right Wrist)   Pulse 85   Temp 97.9 F (36.6 C) (Oral)   Resp 18   Ht 5\' 5"  (1.651 m)   Wt 114.2 kg   SpO2 96%   BMI 41.90 kg/m   Intake/Output Summary (Last 24 hours) at 06/30/2020 0853 Last data filed at 06/30/2020 7829 Gross per 24 hour  Intake --  Output 3700 ml  Net -3700 ml   Weight change:   Physical  Exam: GEN: NAD, A&Ox3, NCAT, Morbidly obese HEENT: Conjunctival pallor, EOMI, vision intact NECK: Supple, no thyromegaly LUNGS: CTA B/L no rales, distant BS CV: RRR ABD: SNDNT +BS  EXT: No lower extremity pitting edema  Imaging: DG Chest 1 View  Result Date: 06/28/2020 CLINICAL DATA:  Chest pain. EXAM: CHEST  1 VIEW COMPARISON:  Chest x-ray dated June 15, 2011. FINDINGS: Borderline cardiomegaly. Normal pulmonary vascularity. Left basilar atelectasis. No focal consolidation, pleural effusion, or pneumothorax. No acute osseous abnormality. IMPRESSION: 1. Left basilar atelectasis. Electronically Signed   By: Titus Dubin M.D.   On: 06/28/2020 17:40   DG Elbow Complete Left  Result Date: 06/28/2020 CLINICAL DATA:  Left elbow pain and swelling after physical therapy today. No injury. EXAM: LEFT ELBOW - COMPLETE 3+ VIEW COMPARISON:  None. FINDINGS: No acute fracture or dislocation. Large joint effusion. Joint spaces are preserved. Small marginal osteophytes. Posterior elbow soft tissue swelling. IMPRESSION: 1. Large joint effusion without acute osseous abnormality. In the absence of recent trauma, this could reflect underlying crystal arthropathy given history of gout. Electronically Signed   By: Titus Dubin M.D.   On: 06/28/2020 17:42   DG Wrist Complete Left  Result Date: 06/28/2020 CLINICAL DATA:  Left hand and wrist pain and swelling after physical therapy. No injury. EXAM: LEFT HAND - COMPLETE 3+ VIEW; LEFT WRIST - COMPLETE 3+ VIEW COMPARISON:  Left hand x-rays dated May 04, 2020. FINDINGS: No acute fracture or dislocation. Suspected large erosions involving the second and third proximal phalanx heads,  third metacarpal head, and capitate are unchanged. Unchanged moderate third PIP joint and mild third MCP joint space narrowing. Remaining joint spaces are preserved. Bone mineralization is normal. New diffuse soft tissue swelling of the wrist, dorsal hand, and index finger. Progressive soft  tissue swelling of the long finger. IMPRESSION: 1. Diffuse soft tissue swelling. No acute osseous abnormality. 2. 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 Electronically Signed   By: Titus Dubin M.D.   On: 06/28/2020 17:48   US RENAL  Result Date: 06/28/2020 CLINICAL DATA:  Acute kidney injury EXAM: RENAL / URINARY TRACT ULTRASOUND COMPLETE COMPARISON:  None. FINDINGS: Right Kidney: Renal measurements: 10.0 x 5.6 x 3.8 cm = volume: 110 mL . Echogenicity within normal limits. No mass or hydronephrosis visualized. Left Kidney: Renal measurements: 9.6 x 5.7 x 3.7 cm = volume: 106 mL. Echogenicity within normal limits. No mass or hydronephrosis visualized. Bladder: Appears normal for degree of bladder distention. Other: None. IMPRESSION: No acute findings.  No hydronephrosis. Electronically Signed   By: Rolm Baptise M.D.   On: 06/28/2020 20:20   DG Shoulder Left  Result Date: 06/28/2020 CLINICAL DATA:  Left shoulder pain after physical therapy today. EXAM: LEFT SHOULDER - 2+ VIEW COMPARISON:  None. FINDINGS: No acute fracture or dislocation. Mild acromioclavicular osteoarthritis. Soft tissues are unremarkable. IMPRESSION: 1. No acute osseous abnormality. Electronically Signed   By: Titus Dubin M.D.   On: 06/28/2020 17:39   DG Hand Complete Left  Result Date: 06/28/2020 CLINICAL DATA:  Left hand and wrist pain and swelling after physical therapy. No injury. EXAM: LEFT HAND - COMPLETE 3+ VIEW; LEFT WRIST - COMPLETE 3+ VIEW COMPARISON:  Left hand x-rays dated May 04, 2020. FINDINGS: No acute fracture or dislocation. Suspected large erosions involving the second and third proximal phalanx heads, third metacarpal head, and capitate are unchanged. Unchanged moderate third PIP joint and mild third MCP joint space narrowing. Remaining joint spaces are preserved. Bone mineralization is normal. New diffuse soft tissue swelling of the wrist,  dorsal hand, and index finger. Progressive soft tissue swelling of the long finger. IMPRESSION: 1. Diffuse soft tissue swelling. No acute osseous abnormality. 2. 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 Electronically Signed   By: Titus Dubin M.D.   On: 06/28/2020 17:48    Labs: BMET Recent Labs  Lab 06/28/20 1653 06/28/20 1854 06/28/20 1855 06/29/20 0330 06/30/20 0223  NA 131* 129* 132* 136 138  K 6.2* 5.7* 3.7 4.1 3.6  CL 86*  --  88* 95* 99  CO2 22  --  24 21* 25  GLUCOSE 241*  --  222* 272* 451*  BUN 132*  --  133* 123* 111*  CREATININE 6.68*  --  6.38* 4.92* 2.69*  CALCIUM 9.0  --  8.9 9.0 9.6   CBC Recent Labs  Lab 06/28/20 1653 06/28/20 1854 06/29/20 0330 06/30/20 0223  WBC 14.2*  --  11.8* 10.5  NEUTROABS 11.9*  --   --   --   HGB 10.1* 11.6* 9.4* 9.7*  HCT 31.3* 34.0* 29.0* 29.2*  MCV 75.2*  --  74.7* 74.3*  PLT 183  --  190 233    Medications:    . aspirin EC  81 mg Oral Daily  . Chlorhexidine Gluconate Cloth  6 each Topical Daily  . ferrous sulfate  325 mg Oral Q breakfast  . heparin  5,000  Units Subcutaneous Q12H  . insulin aspart  0-15 Units Subcutaneous TID AC & HS  . insulin glargine  18 Units Subcutaneous QHS  . loratadine  10 mg Oral Daily  . pantoprazole  40 mg Oral QHS  . predniSONE  40 mg Oral QAC breakfast  . traZODone  50 mg Oral QHS      Otelia Santee, MD 06/30/2020, 8:53 AM

## 2020-06-30 NOTE — Consult Note (Signed)
Referring Provider: Dr. Hosie Poisson Primary Care Physician:  Charlott Rakes, MD Primary Gastroenterologist:  Althia Forts  Reason for Consultation:  Anemia, FOBT positive stools  HPI: [name] is a 59 y.o. male with history of chronic anemia, CKD stage 3, type 2 diabetes, hypertension, and gout presenting for consultation of anemia and FOBT positive stools.  Denies any changes in stool.  Has not seen any melena or hematochezia.  Denies diarrhea or constipation.  Patient denies any gastrointestinal complaints.  His main complaint is that he is having left arm pain related to his gout.  Further denies any abdominal pain nausea, vomiting, hematemesis, dysphagia, GERD, changes in appetite, unexplained weight loss.  Denies any family history of colon cancer or gastrointestinal malignancy.  States he had a colonoscopy 5 to 10 years ago but does not recall the location or who performed the procedure.  He states his colonoscopy is normal as far as he knows.  Past Medical History:  Diagnosis Date  . Gouty bursitis, elbow  10/18/2015  . Hx of gout   . Hyperlipidemia   . Hypertension   . Knee pain, chronic   . Type II diabetes mellitus ([place])     Past Surgical History:  Procedure Laterality Date  . KNEE ARTHROSCOPY Bilateral 12/29/2015   Procedure: ARTHROSCOPY KNEE BILATERAL WITH  PARTIAL MEDIAL AND LATERAL MENISCECTOMY AND FOUR COMPARTMENT SYNOVECTOMY , CHONDRALPLASTIES, PATELLA-FEMORAL CHONDRALPLASTIES BILATERALLY;  Surgeon: Dorna Leitz, MD;  Location: [facility];  Service: Orthopedics;  Laterality: Bilateral;  . NO PAST SURGERIES      Prior to Admission medications   Medication Sig Start Date End Date Taking? Authorizing Provider  aspirin 81 MG EC tablet Take 1 tablet (81 mg total) by mouth daily. 12/01/17  Yes Charlott Rakes, MD  atorvastatin (LIPITOR) 40 MG tablet Take 1 tablet (40 mg total) by mouth daily at 6 PM. 03/13/20  Yes Newlin, Enobong, MD  cetirizine (ZYRTEC) 10 MG tablet  TAKE 1 TABLET (10 MG TOTAL) BY MOUTH DAILY. 05/05/18  Yes Charlott Rakes, MD  cyclobenzaprine (FLEXERIL) 10 MG tablet Take 10 mg by mouth 3 (three) times daily. 06/24/20  Yes [provider]  diclofenac Sodium (VOLTAREN) 1 % GEL Apply 2 g topically 4 (four) times daily. 06/12/20  Yes [provider]  FEROSUL 325 (65 Fe) MG tablet TAKE 1 TABLET BY MOUTH DAILY WITH BREAKFAST. Patient taking differently: Take 325 mg by mouth daily with breakfast.  10/06/17  Yes Newlin, Enobong, MD  furosemide (LASIX) 80 MG tablet Take 80 mg by mouth daily. 05/16/20  Yes [provider]  gabapentin (NEURONTIN) 300 MG capsule Take 1 capsule (300 mg total) by mouth 2 (two) times daily. 03/13/20  Yes Charlott Rakes, MD  ibuprofen (ADVIL) 200 MG tablet Take 400 mg by mouth every 6 (six) hours as needed for moderate pain.   Yes [provider]  liraglutide (VICTOZA) 18 MG/3ML SOPN Inject 0.2 mLs (1.2 mg total) into the skin every morning. 03/13/20  Yes Charlott Rakes, MD  lisinopril-hydrochlorothiazide (ZESTORETIC) 20-12.5 MG tablet Take 1 tablet by mouth daily. 03/13/20  Yes Charlott Rakes, MD  metFORMIN (GLUCOPHAGE) 500 MG tablet Take 2 tablets (1,000 mg total) by mouth 2 (two) times daily with a meal. 03/13/20  Yes Newlin, Enobong, MD  metoprolol succinate (TOPROL-XL) 50 MG 24 hr tablet Take 1 tablet (50 mg total) by mouth daily. 03/13/20  Yes Newlin, Enobong, MD  MITIGARE 0.6 MG CAPS Take 2 tabs (1.2 mg) at the onset of a gout flare.  May repeat in 1 hour if symptoms persist. Patient taking differently: Take 1.2 mg by mouth See admin instructions. Take 2 tabs (1.2 mg) at the onset of a gout flare.  May repeat in 1 hour if symptoms persist. 06/13/20  Yes Newlin, Enobong, MD  NOVOLOG MIX 70/30 (70-30) 100 UNIT/ML injection INJECT 48 UNITS SUBCUTANEOUSLY TWICE DAILY WITH A MEAL (DOSE CHANGE) Patient taking differently: Inject 50 Units into the skin 2 (two) times daily with a meal.  05/25/20  Yes  Newlin, Enobong, MD  traZODone (DESYREL) 50 MG tablet Take 1 tablet (50 mg total) by mouth at bedtime. 06/29/19  Yes Charlott Rakes, MD  Accu-Chek FastClix Lancets MISC 1 each by Does not apply route 3 (three) times daily. as directed 02/09/20   Charlott Rakes, MD  Alcohol Swabs (B-D SINGLE USE SWABS REGULAR) PADS Please use as instructed before checking blood sugar and before injecting insulin. 02/09/20   Charlott Rakes, MD  allopurinol (ZYLOPRIM) 300 MG tablet Take 1 tablet (300 mg total) by mouth daily. 06/27/20   Charlott Rakes, MD  Blood Glucose Calibration (ACCU-CHEK AVIVA) SOLN Use to calibrate your glucometer when needed. 02/09/20   Charlott Rakes, MD  Blood Glucose Monitoring Suppl (ACCU-CHEK AVIVA PLUS) w/Device KIT USE AS INSTRUCTED THREE TIMES DAILY. E11.69 02/09/20   Charlott Rakes, MD  DROPLET PEN NEEDLES 32G X 4 MM MISC USE AS DIRECTED DAILY TO ADMINISTER VICTOZA 05/25/20   Charlott Rakes, MD  glucose blood (ACCU-CHEK AVIVA PLUS) test strip USE AS INSTRUCTED THREE TIMES DAILY. E11.69 02/09/20   Charlott Rakes, MD  Insulin Syringe-Needle U-100 (TRUEPLUS INSULIN SYRINGE) 30G X 5/16" 1 ML MISC USE TWICE DAILY 05/17/19   Charlott Rakes, MD    Scheduled Meds: . aspirin EC  81 mg Oral Daily  . Chlorhexidine Gluconate Cloth  6 each Topical Daily  . ferrous sulfate  325 mg Oral Q breakfast  . heparin  5,000 Units Subcutaneous Q12H  . insulin aspart  0-20 Units Subcutaneous TID WC  . insulin aspart  0-5 Units Subcutaneous QHS  . insulin glargine  18 Units Subcutaneous QHS  . loratadine  10 mg Oral Daily  . pantoprazole  40 mg Oral QHS  . predniSONE  40 mg Oral QAC breakfast  . traZODone  50 mg Oral QHS   Continuous Infusions: PRN Meds:.acetaminophen **OR** acetaminophen, hydrALAZINE, HYDROmorphone (DILAUDID) injection  Allergies as of 06/28/2020  . (No Known Allergies)    Family History  Problem Relation Age of Onset  . Diabetes Mother   . Diabetes Father     Social History    Socioeconomic History  . Marital status: Single    Spouse name: Not on file  . Number of children: Not on file  . Years of education: Not on file  . Highest education level: Not on file  Occupational History  . Not on file  Tobacco Use  . Smoking status: Never Smoker  . Smokeless tobacco: Never Used  Vaping Use  . Vaping Use: Never used  Substance and Sexual Activity  . Alcohol use: No  . Drug use: No  . Sexual activity: Not Currently  Other Topics Concern  . Not on file  Social History Narrative  . Not on file   Social Determinants of Health   Financial Resource Strain:   . Difficulty of Paying Living Expenses:   Food Insecurity:   . Worried About Charity fundraiser in the Last Year:   . [place] in the Last  Year:   Transportation Needs:   . Film/video editor (Medical):   Marland Kitchen Lack of Transportation (Non-Medical):   Physical Activity:   . Days of Exercise per Week:   . Minutes of Exercise per Session:   Stress:   . Feeling of Stress :   Social Connections:   . Frequency of Communication with Friends and Family:   . Frequency of Social Gatherings with Friends and Family:   . Attends Religious Services:   . Active Member of Clubs or Organizations:   . Attends Archivist Meetings:   Marland Kitchen Marital Status:   Intimate Partner Violence:   . Fear of Current or Ex-Partner:   . Emotionally Abused:   Marland Kitchen Physically Abused:   . Sexually Abused:     Review of Systems: Review of Systems  Constitutional: Positive for fever. Negative for chills and weight loss.  HENT: Negative for sore throat.   Eyes: Negative for pain and redness.  Respiratory: Negative for cough, shortness of breath and stridor.   Cardiovascular: Negative for chest pain and palpitations.  Gastrointestinal: Negative for abdominal pain, blood in stool, constipation, diarrhea, heartburn, melena, nausea and vomiting.  Genitourinary: Negative for flank pain and hematuria.  Musculoskeletal:  Positive for joint pain. Negative for falls.  Skin: Negative for itching and rash.  Neurological: Negative for seizures and loss of consciousness.  Endo/Heme/Allergies: Negative for polydipsia. Does not bruise/bleed easily.  Psychiatric/Behavioral: Negative for substance abuse. The patient is not nervous/anxious.     Physical Exam: Vital signs: Vitals:   06/30/20 1040 06/30/20 1140  BP: 115/83 115/82  Pulse: 88 85  Resp: (!) 21 15  Temp: 98.8 F (37.1 C)   SpO2: 97% 98%   Last BM Date: 06/28/20 Physical Exam Constitutional:      General: He is not in acute distress.    Appearance: He is obese.  HENT:     Head: Normocephalic and atraumatic.     Nose: Nose normal.     Mouth/Throat:     Mouth: Mucous membranes are moist.     Pharynx: Oropharynx is clear.  Eyes:     General: No scleral icterus.    Extraocular Movements: Extraocular movements intact.     Conjunctiva/sclera: Conjunctivae normal.  Cardiovascular:     Rate and Rhythm: Normal rate and regular rhythm.     Pulses: Normal pulses.     Heart sounds: Normal heart sounds.  Pulmonary:     Effort: Pulmonary effort is normal. No respiratory distress.     Breath sounds: Normal breath sounds.  Abdominal:     General: Bowel sounds are normal. There is no distension.     Palpations: Abdomen is soft. There is no mass.     Tenderness: There is no abdominal tenderness. There is no guarding or rebound.     Hernia: No hernia is present.  Musculoskeletal:        General: No deformity.     Cervical back: Normal range of motion and neck supple.     Right lower leg: No edema.     Left lower leg: No edema.  Neurological:     General: No focal deficit present.     Mental Status: He is alert and oriented to person, place, and time.  Psychiatric:        Mood and Affect: Mood normal.        Behavior: Behavior normal.     GI:  Lab Results: Recent Labs    06/28/20 1653 06/28/20  1653 06/28/20 1854 06/29/20 0330  06/30/20 0223  WBC 14.2*  --   --  11.8* 10.5  HGB 10.1*   < > 11.6* 9.4* 9.7*  HCT 31.3*   < > 34.0* 29.0* 29.2*  PLT 183  --   --  190 233   < > = values in this interval not displayed.   BMET Recent Labs    06/28/20 1855 06/29/20 0330 06/30/20 0223  NA 132* 136 138  K 3.7 4.1 3.6  CL 88* 95* 99  CO2 24 21* 25  GLUCOSE 222* 272* 451*  BUN 133* 123* 111*  CREATININE 6.38* 4.92* 2.69*  CALCIUM 8.9 9.0 9.6   LFT Recent Labs    06/30/20 0223  PROT 7.1  ALBUMIN 2.5*  AST 151*  ALT 66*  ALKPHOS 69  BILITOT 0.8   PT/INR No results for input(s): LABPROT, INR in the last 72 hours.   Studies/Results: DG Chest 1 View  Result Date: 06/28/2020 CLINICAL DATA:  Chest pain. EXAM: CHEST  1 VIEW COMPARISON:  Chest x-ray dated June 15, 2011. FINDINGS: Borderline cardiomegaly. Normal pulmonary vascularity. Left basilar atelectasis. No focal consolidation, pleural effusion, or pneumothorax. No acute osseous abnormality. IMPRESSION: 1. Left basilar atelectasis. Electronically Signed   By: Titus Dubin M.D.   On: 06/28/2020 17:40   DG Elbow Complete Left  Result Date: 06/28/2020 CLINICAL DATA:  Left elbow pain and swelling after physical therapy today. No injury. EXAM: LEFT ELBOW - COMPLETE 3+ VIEW COMPARISON:  None. FINDINGS: No acute fracture or dislocation. Large joint effusion. Joint spaces are preserved. Small marginal osteophytes. Posterior elbow soft tissue swelling. IMPRESSION: 1. Large joint effusion without acute osseous abnormality. In the absence of recent trauma, this could reflect underlying crystal arthropathy given history of gout. Electronically Signed   By: Titus Dubin M.D.   On: 06/28/2020 17:42   DG Wrist Complete Left  Result Date: 06/28/2020 CLINICAL DATA:  Left hand and wrist pain and swelling after physical therapy. No injury. EXAM: LEFT HAND - COMPLETE 3+ VIEW; LEFT WRIST - COMPLETE 3+ VIEW COMPARISON:  Left hand x-rays dated May 04, 2020. FINDINGS: No  acute fracture or dislocation. Suspected large erosions involving the second and third proximal phalanx heads, third metacarpal head, and capitate are unchanged. Unchanged moderate third PIP joint and mild third MCP joint space narrowing. Remaining joint spaces are preserved. Bone mineralization is normal. New diffuse soft tissue swelling of the wrist, dorsal hand, and index finger. Progressive soft tissue swelling of the long finger. IMPRESSION: 1. Diffuse soft tissue swelling. No acute osseous abnormality. 2. 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 Electronically Signed   By: Titus Dubin M.D.   On: 06/28/2020 17:48   US RENAL  Result Date: 06/28/2020 CLINICAL DATA:  Acute kidney injury EXAM: RENAL / URINARY TRACT ULTRASOUND COMPLETE COMPARISON:  None. FINDINGS: Right Kidney: Renal measurements: 10.0 x 5.6 x 3.8 cm = volume: 110 mL . Echogenicity within normal limits. No mass or hydronephrosis visualized. Left Kidney: Renal measurements: 9.6 x 5.7 x 3.7 cm = volume: 106 mL. Echogenicity within normal limits. No mass or hydronephrosis visualized. Bladder: Appears normal for degree of bladder distention. Other: None. IMPRESSION: No acute findings.  No hydronephrosis. Electronically Signed   By: Rolm Baptise M.D.   On: 06/28/2020 20:20   DG Shoulder Left  Result Date: 06/28/2020 CLINICAL DATA:  Left shoulder pain after physical therapy today. EXAM: LEFT SHOULDER -  2+ VIEW COMPARISON:  None. FINDINGS: No acute fracture or dislocation. Mild acromioclavicular osteoarthritis. Soft tissues are unremarkable. IMPRESSION: 1. No acute osseous abnormality. Electronically Signed   By: Titus Dubin M.D.   On: 06/28/2020 17:39   DG Hand Complete Left  Result Date: 06/28/2020 CLINICAL DATA:  Left hand and wrist pain and swelling after physical therapy. No injury. EXAM: LEFT HAND - COMPLETE 3+ VIEW; LEFT WRIST - COMPLETE 3+ VIEW COMPARISON:  Left  hand x-rays dated May 04, 2020. FINDINGS: No acute fracture or dislocation. Suspected large erosions involving the second and third proximal phalanx heads, third metacarpal head, and capitate are unchanged. Unchanged moderate third PIP joint and mild third MCP joint space narrowing. Remaining joint spaces are preserved. Bone mineralization is normal. New diffuse soft tissue swelling of the wrist, dorsal hand, and index finger. Progressive soft tissue swelling of the long finger. IMPRESSION: 1. Diffuse soft tissue swelling. No acute osseous abnormality. 2. 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 Electronically Signed   By: Titus Dubin M.D.   On: 06/28/2020 17:48    Impression: Anemia, FOBT positive stools: -Hemoglobin 9.7 today as compared to 9.4 yesterday, stable as compared to 10.1 as of 8/11, though slightly decreased from baseline -FOBT positive stools without any frank bleeding   Rhabdomyolysis -Suspect transaminitis may be related to rhabdo.  T bili 0.8/AST 151/ALT 66/ALP 69 today   Acute on chronic renal disease  DM type II with hyperglycemia  Acute on chronic renal disease  Plan: No signs of acute bleeding and no GI complaints.  Due to FOBT positive stools and chronic anemia, recommend proceeding with outpatient colonoscopy.  Our office will contact patient to arrange follow-up.  Continue to trend LFTs to normalization.    [facility] will sign off.  Please contact us if we can be of any further assistance during this hospital stay.   LOS: 2 days   Salley Slaughter  PA-C 06/30/2020, 12:23 PM  Contact #  [phone]

## 2020-06-30 NOTE — Progress Notes (Signed)
Pt's CBG [id number] at 0849.  Karleen Hampshire MD notified.  MD order to give max insulin sliding scale.

## 2020-07-01 LAB — CBC
HCT: 30.8 % — ABNORMAL LOW (ref 39.0–52.0)
Hemoglobin: 10.2 g/dL — ABNORMAL LOW (ref 13.0–17.0)
MCH: 24.6 pg — ABNORMAL LOW (ref 26.0–34.0)
MCHC: 33.1 g/dL (ref 30.0–36.0)
MCV: 74.4 fL — ABNORMAL LOW (ref 80.0–100.0)
Platelets: 180 10*3/uL (ref 150–400)
RBC: 4.14 MIL/uL — ABNORMAL LOW (ref 4.22–5.81)
RDW: 16.9 % — ABNORMAL HIGH (ref 11.5–15.5)
WBC: 10.4 10*3/uL (ref 4.0–10.5)
nRBC: 0 % (ref 0.0–0.2)

## 2020-07-01 LAB — COMPREHENSIVE METABOLIC PANEL
ALT: 66 U/L — ABNORMAL HIGH (ref 0–44)
AST: 110 U/L — ABNORMAL HIGH (ref 15–41)
Albumin: 2.5 g/dL — ABNORMAL LOW (ref 3.5–5.0)
Alkaline Phosphatase: 61 U/L (ref 38–126)
Anion gap: 13 (ref 5–15)
BUN: 93 mg/dL — ABNORMAL HIGH (ref 6–20)
CO2: 26 mmol/L (ref 22–32)
Calcium: 9.6 mg/dL (ref 8.9–10.3)
Chloride: 102 mmol/L (ref 98–111)
Creatinine, Ser: 1.95 mg/dL — ABNORMAL HIGH (ref 0.61–1.24)
GFR calc Af Amer: 42 mL/min — ABNORMAL LOW (ref >60)
GFR calc non Af Amer: 36 mL/min — ABNORMAL LOW (ref >60)
Glucose, Bld: 334 mg/dL — ABNORMAL HIGH (ref 70–99)
Potassium: 3.9 mmol/L (ref 3.5–5.1)
Sodium: 141 mmol/L (ref 135–145)
Total Bilirubin: 0.6 mg/dL (ref 0.3–1.2)
Total Protein: 7.1 g/dL (ref 6.5–8.1)

## 2020-07-01 LAB — GLUCOSE, CAPILLARY
Glucose-Capillary: 300 mg/dL — ABNORMAL HIGH (ref 70–99)
Glucose-Capillary: 357 mg/dL — ABNORMAL HIGH (ref 70–99)
Glucose-Capillary: 252 mg/dL — ABNORMAL HIGH (ref 70–99)
Glucose-Capillary: 250 mg/dL — ABNORMAL HIGH (ref 70–99)

## 2020-07-01 LAB — CALCIUM, IONIZED: Calcium, Ionized, Serum: 5.1 mg/dL (ref 4.5–5.6)

## 2020-07-01 LAB — CK: Total CK: 2001 U/L — ABNORMAL HIGH (ref 49–397)

## 2020-07-01 MED ORDER — INSULIN GLARGINE 100 UNIT/ML ~~LOC~~ SOLN
22.0000 [IU] | Freq: Two times a day (BID) | SUBCUTANEOUS | Status: DC
  Administered 2020-07-01 – 2020-07-10 (×17): 22 [IU] via SUBCUTANEOUS
  Filled 2020-07-01 (×19): qty 0.22

## 2020-07-01 NOTE — Progress Notes (Signed)
PROGRESS NOTE    [name]  HYQ:[id number] DOB: [birth year] DOA: 06/28/2020 PCP: Charlott Rakes, MD    Chief Complaint  Patient presents with  . Hand Pain  . Weakness    Brief Narrative:  59 year old gentleman with prior history of chronic gouty arthritis of multiple joint, stage II CKD, insulin-dependent diabetes mellitus, hypertension, hyperlipidemia, anemia of chronic disease, iron deficiency anemia, presents to ED for lethargy and multijoint pain. As per the sister-in-law at bedside patient was unable to stand or walk and was brought to ED.  He was found to be in AKI, hyperkalemic and in rhabdomyolysis. He was started on aggressive hydration and his creatinine has improved from 6.38 to 2.69. orthopedics consulted for his left elbow swelling and elbow joint effusion. Currently pt is on steroids for the gouty flare up of the multiple joints. He was also found to have guiac positive stools. GI consulted, recommended outpatient colonoscopy and EGD .  Pt seen and examined at bedside , elbow pain is improving. PT evaluation recommending SNF.   Assessment & Plan:   Active Problems:   AKI (acute kidney injury) ([place])   Arthralgia   Left elbow pain   Left hand pain   Non-traumatic rhabdomyolysis   AKI on stage II CKD Probably secondary to volume depletion/renal hypoperfusion. Started the patient on IV fluid resuscitation and renal parameters have improved. He was started on aggressive hydration and his creatinine has improved from 6.38 to 2.69 TO 1.95.  Nephrology consulted and recommended to continue with IV fluids.   Ultrasound renal did not show any hydronephrosis at this time. No new complaints at this time.    Acute hyperkalemia No EKG changes.  Improved with hydration.  Repeat potassium level  Is wnl.   Toxic encephalopathy probably uremic in nature slowly improving with improvement in renal parameters. Resolved. Pt is alert and oriented at this time.    Acute gouty  flareup Patient was started on IV Solu-Medrol, transitioned to oral prednisone , recommend 7 to 10 day course of prednisone.   Continue to hold allopurinol, colchicine and hydrochlorothiazide at this time.    Insulin-dependent diabetes mellitus, uncontrolled with hyperglycemia probably secondary to IV steroids CBG (last 3)  Recent Labs    06/30/20 1716 07/01/20 0819 07/01/20 1225  GLUCAP 321* 300* 357*   Change to resistant scale SSI.   a1C IS 7.1 Add NovoLog 4 units 3 times daily AC. Increased lantus to 22 units BID.    Essential hypertension WELL CONTROLLED.   Iron deficiency anemia, anemia of chronic disease Baseline hemoglobin around 11 in 2020 November.  Slow decline to 9.4 in the last 8 months. Stool for occult blood is positive. Transfuse to keep hemoglobin greater than seven.  Continue to monitor. GI consulted to see if he needs a colonoscopy or EGD, recommended outpatient follow upw ith GI.     Elevated lactic acid Probably secondary to dehydration, normalized with IV fluids.   Nontraumatic rhabdomyolysis with AKI, anion gap metabolic acidosis Improving with IV fluids. CK levels are improving. CK level is 2001.  metabolic acidosis improving, bicarb level is 26.  Fever Probably secondary to acute gouty flareup of multiple joints Blood cultures have been negative so far. Urine analysis is negative for infection. Chest x-ray does not show an acute cardiopulmonary abnormalities Resolved.     DVT prophylaxis: (Heparin) Code Status: full code.  Family Communication: none at bedside.  Disposition:   Status is: Inpatient  Remains inpatient appropriate because:IV treatments appropriate due  to intensity of illness or inability to take PO   Dispo: The patient is from: Home              Anticipated d/c is to: SNF              Anticipated d/c date is: 1 day              Patient currently is medically stable to d/c.       Consultants:    Orthopedics  Nephrology  Gastroenterology.    Procedures: none.   Antimicrobials: none.   Subjective: Elbow pain is improving. No chest pain or sob. No nausea, vomiting.   Objective: Vitals:   07/01/20 0433 07/01/20 0458 07/01/20 0850 07/01/20 1249  BP: 93/62 106/72 114/87 (!) 126/93  Pulse:   75 74  Resp: 10 16 14 18   Temp:   98 F (36.7 C) 97.8 F (36.6 C)  TempSrc:   Oral Oral  SpO2: 100% 100% 98% 98%  Weight:      Height:        Intake/Output Summary (Last 24 hours) at 07/01/2020 1449 Last data filed at 07/01/2020 1309 Gross per 24 hour  Intake 960 ml  Output 1800 ml  Net -840 ml   Filed Weights   06/28/20 1623 06/28/20 2116  Weight: 116.6 kg 114.2 kg    Examination:  General exam: Alert and comfortable, no distress noted Respiratory system: Clear to auscultation bilaterally, no wheezing or rhonchi Cardiovascular system: S1-S2 heard, regular rate rhythm, no JVD,  Gastrointestinal system: Abdomen is soft, nontender, nondistended, bowel sounds normal Central nervous system: Alert and oriented, grossly nonfocal Extremities: Left elbow swelling present, tenderness improved, pedal edema present Skin: No rashes seen Psychiatry: Mood is appropriate    Data Reviewed: I have personally reviewed following labs and imaging studies  CBC: Recent Labs  Lab 06/28/20 1653 06/28/20 1854 06/29/20 0330 06/30/20 0223 07/01/20 0324  WBC 14.2*  --  11.8* 10.5 10.4  NEUTROABS 11.9*  --   --   --   --   HGB 10.1* 11.6* 9.4* 9.7* 10.2*  HCT 31.3* 34.0* 29.0* 29.2* 30.8*  MCV 75.2*  --  74.7* 74.3* 74.4*  PLT 183  --  190 233 353    Basic Metabolic Panel: Recent Labs  Lab 06/28/20 1653 06/28/20 1653 06/28/20 1854 06/28/20 1855 06/29/20 0330 06/30/20 0223 07/01/20 0324  NA 131*   < > 129* 132* 136 138 141  K 6.2*   < > 5.7* 3.7 4.1 3.6 3.9  CL 86*  --   --  88* 95* 99 102  CO2 22  --   --  24 21* 25 26  GLUCOSE 241*  --   --  222* 272* 451* 334*  BUN  132*  --   --  133* 123* 111* 93*  CREATININE 6.68*  --   --  6.38* 4.92* 2.69* 1.95*  CALCIUM 9.0  --   --  8.9 9.0 9.6 9.6   < > = values in this interval not displayed.    GFR: Estimated Creatinine Clearance: 47.1 mL/min (A) (by C-G formula based on SCr of 1.95 mg/dL (H)).  Liver Function Tests: Recent Labs  Lab 06/30/20 0223 07/01/20 0324  AST 151* 110*  ALT 66* 66*  ALKPHOS 69 61  BILITOT 0.8 0.6  PROT 7.1 7.1  ALBUMIN 2.5* 2.5*    CBG: Recent Labs  Lab 06/30/20 0849 06/30/20 1144 06/30/20 1716 07/01/20 0819 07/01/20 1225  GLUCAP  437* 457* 321* 300* 357*     Recent Results (from the past 240 hour(s))  SARS Coronavirus 2 by RT PCR (hospital order, performed in [facility] hospital lab) Nasopharyngeal Nasopharyngeal Swab     Status: None   Collection Time: 06/28/20  6:43 PM   Specimen: Nasopharyngeal Swab  Result Value Ref Range Status   SARS Coronavirus 2 NEGATIVE NEGATIVE Final    Comment: (NOTE) SARS-CoV-2 target nucleic acids are NOT DETECTED.  The SARS-CoV-2 RNA is generally detectable in upper and lower respiratory specimens during the acute phase of infection. The lowest concentration of SARS-CoV-2 viral copies this assay can detect is 250 copies / mL. A negative result does not preclude SARS-CoV-2 infection and should not be used as the sole basis for treatment or other patient management decisions.  A negative result may occur with improper specimen collection / handling, submission of specimen other than nasopharyngeal swab, presence of viral mutation(s) within the areas targeted by this assay, and inadequate number of viral copies (<250 copies / mL). A negative result must be combined with clinical observations, patient history, and epidemiological information.  Fact Sheet for Patients:   StrictlyIdeas.no  Fact Sheet for Healthcare Providers: BankingDealers.co.za  This test is not yet approved or   cleared by the [place] FDA and has been authorized for detection and/or diagnosis of SARS-CoV-2 by FDA under an Emergency Use Authorization (EUA).  This EUA will remain in effect (meaning this test can be used) for the duration of the COVID-19 declaration under Section 564(b)(1) of the Act, 21 U.S.C. section 360bbb-3(b)(1), unless the authorization is terminated or revoked sooner.  Performed at [facility], [place] [address] [place], [place] [address]   Culture, Urine     Status: None   Collection Time: 06/28/20 10:00 PM   Specimen: Urine, Catheterized  Result Value Ref Range Status   Specimen Description URINE, CATHETERIZED  Final   Special Requests NONE  Final   Culture   Final    NO GROWTH Performed at [place] [address], [place], [place] [address]    Report Status 06/29/2020 FINAL  Final  Culture, blood (routine x 2)     Status: None (Preliminary result)   Collection Time: 06/28/20 11:51 PM   Specimen: BLOOD RIGHT HAND  Result Value Ref Range Status   Specimen Description BLOOD RIGHT HAND  Final   Special Requests   Final    BOTTLES DRAWN AEROBIC AND ANAEROBIC Blood Culture adequate volume   Culture   Final    NO GROWTH 2 DAYS Performed at [facility], [place] [address] [place], [place] [address]    Report Status PENDING  Incomplete  Culture, blood (routine x 2)     Status: None (Preliminary result)   Collection Time: 06/29/20  3:29 AM   Specimen: BLOOD RIGHT HAND  Result Value Ref Range Status   Specimen Description BLOOD RIGHT HAND  Final   Special Requests   Final    BOTTLES DRAWN AEROBIC AND ANAEROBIC Blood Culture adequate volume   Culture   Final    NO GROWTH 2 DAYS Performed at [facility], [place] [address], [place], [place] [address]    Report Status PENDING  Incomplete         Radiology Studies: No results found.      Scheduled Meds: . aspirin EC  81 mg Oral Daily  . Chlorhexidine Gluconate Cloth  6 each  Topical Daily  .  ferrous sulfate  325 mg Oral Q breakfast  . heparin  5,000 Units Subcutaneous Q12H  . insulin aspart  0-20 Units Subcutaneous TID WC  . insulin aspart  0-5 Units Subcutaneous QHS  . insulin glargine  18 Units Subcutaneous BID  . loratadine  10 mg Oral Daily  . pantoprazole  40 mg Oral QHS  . predniSONE  40 mg Oral QAC breakfast  . traZODone  50 mg Oral QHS   Continuous Infusions:   LOS: 3 days        Hosie Poisson, MD [facility]   To contact the attending provider between 7A-7P or the covering provider during after hours 7P-7A, please log into the web site [email or web address] and access using universal [place] password for that web site. If you do not have the password, please call the hospital operator.  07/01/2020, 2:49 PM

## 2020-07-02 LAB — COMPREHENSIVE METABOLIC PANEL
ALT: 56 U/L — ABNORMAL HIGH (ref 0–44)
AST: 69 U/L — ABNORMAL HIGH (ref 15–41)
Albumin: 2.7 g/dL — ABNORMAL LOW (ref 3.5–5.0)
Alkaline Phosphatase: 55 U/L (ref 38–126)
Anion gap: 11 (ref 5–15)
BUN: 66 mg/dL — ABNORMAL HIGH (ref 6–20)
CO2: 27 mmol/L (ref 22–32)
Calcium: 9.6 mg/dL (ref 8.9–10.3)
Chloride: 106 mmol/L (ref 98–111)
Creatinine, Ser: 1.49 mg/dL — ABNORMAL HIGH (ref 0.61–1.24)
GFR calc Af Amer: 58 mL/min — ABNORMAL LOW (ref >60)
GFR calc non Af Amer: 50 mL/min — ABNORMAL LOW (ref >60)
Glucose, Bld: 156 mg/dL — ABNORMAL HIGH (ref 70–99)
Potassium: 3.6 mmol/L (ref 3.5–5.1)
Sodium: 144 mmol/L (ref 135–145)
Total Bilirubin: 0.9 mg/dL (ref 0.3–1.2)
Total Protein: 7.2 g/dL (ref 6.5–8.1)

## 2020-07-02 LAB — GLUCOSE, CAPILLARY
Glucose-Capillary: 178 mg/dL — ABNORMAL HIGH (ref 70–99)
Glucose-Capillary: 132 mg/dL — ABNORMAL HIGH (ref 70–99)
Glucose-Capillary: 245 mg/dL — ABNORMAL HIGH (ref 70–99)
Glucose-Capillary: 228 mg/dL — ABNORMAL HIGH (ref 70–99)

## 2020-07-02 LAB — CBC
HCT: 34 % — ABNORMAL LOW (ref 39.0–52.0)
Hemoglobin: 10.6 g/dL — ABNORMAL LOW (ref 13.0–17.0)
MCH: 23.8 pg — ABNORMAL LOW (ref 26.0–34.0)
MCHC: 31.2 g/dL (ref 30.0–36.0)
MCV: 76.4 fL — ABNORMAL LOW (ref 80.0–100.0)
Platelets: 276 10*3/uL (ref 150–400)
RBC: 4.45 MIL/uL (ref 4.22–5.81)
RDW: 17.3 % — ABNORMAL HIGH (ref 11.5–15.5)
WBC: 8.8 10*3/uL (ref 4.0–10.5)
nRBC: 0 % (ref 0.0–0.2)

## 2020-07-02 LAB — CK: Total CK: 708 U/L — ABNORMAL HIGH (ref 49–397)

## 2020-07-02 NOTE — TOC Initial Note (Signed)
Transition of Care [facility]) - Initial/Assessment Note    Patient Details  Name: [name] MRN: [id number] Date of Birth: 1961/01/16  Transition of Care [facility]) CM/SW Contact:    Vinie Sill, [place] Phone Number: 07/02/2020, 1:21 PM  Clinical Narrative:                  CSW visit with patient at bedside. CSW introduced self and explained role. CSW discussed with patient PT recommendation of short term rehab at [facility]. Patient states significant other and his sons are in the home. Patient states he really misses his family and declined SNF. Patient states PT was being set up at home before he was admitted on T/W/TH but he could not remember the name of the agency. CSW gave medicare.gov choice listing to review and maybe he will be able to remember [facility] agency. Patient has received covid vaccines.  Thurmond Butts, MSW, [place] Clinical Social Worker   Expected Discharge Plan: Home w Home Health Services Barriers to Discharge: Continued Medical Work up   Patient Goals and CMS Choice        Expected Discharge Plan and Services Expected Discharge Plan: [place] In-house Referral: Clinical Social Work                                            Prior Living Arrangements/Services   Lives with:: Self, Significant Other, Minor Children Patient language and need for interpreter reviewed:: No        Need for Family Participation in Patient Care: Yes (Comment) Care giver support system in place?: Yes (comment)   Criminal Activity/Legal Involvement Pertinent to Current Situation/Hospitalization: No - Comment as needed  Activities of Daily Living      Permission Sought/Granted                  Emotional Assessment Appearance:: Appears stated age Attitude/Demeanor/Rapport: Engaged Affect (typically observed): Appropriate, Hopeful Orientation: : Oriented to Self, Oriented to Place, Oriented to  Time, Oriented to Situation Alcohol / Substance  Use: Not Applicable Psych Involvement: No (comment)  Admission diagnosis:  Left hand pain [M79.642] Left elbow pain [M25.522] AKI (acute kidney injury) ([place]) [N17.9] Chest pain [R07.9] Non-traumatic rhabdomyolysis [M62.82] Arthralgia, unspecified joint [M25.50] Patient Active Problem List   Diagnosis Date Noted  . Non-traumatic rhabdomyolysis   . Arthralgia   . Left elbow pain   . Left hand pain   . AKI (acute kidney injury) ([place]) 06/28/2020  . Pain due to onychomycosis of toenails of both feet 12/17/2019  . Class 3 severe obesity due to excess calories with serious comorbidity and body mass index (BMI) of 45.0 to 49.9 in adult ([place]) 09/23/2018  . Diabetic neuropathy ([place]) 09/08/2017  . Insomnia 12/26/2016  . Scrotal abscess 10/09/2016  . Acute meniscal tear of right knee 12/29/2015  . Acute meniscal tear of left knee 12/29/2015  . Other specified crystal arthropathies, left knee 12/29/2015  . Other specified crystal arthropathies, right knee 12/29/2015  . Arthritis of knee   . Inability to ambulate due to knee   . Microcytic anemia   . Bilateral knee pain 12/23/2015  . Hypertension 12/23/2015  . Chronic mid back pain 12/23/2015  . Gout 12/23/2015  . Osteoarthritis of both knees 12/23/2015  . Intractable pain 12/23/2015  . Normocytic anemia 12/23/2015  . Gouty bursitis, elbow  10/18/2015  .  IDDM (insulin dependent diabetes mellitus) 10/18/2015   PCP:  Charlott Rakes, MD Pharmacy:   [facility], [place] [address] [place] [address] Phone: [phone] Fax: [facility] [place], [place] [address] Phone: [phone] Fax: [phone]  [id number] - [place], [place] [address] [place] [address] Phone: [phone] Fax: [phone]     Social  Determinants of Health (SDOH) Interventions    Readmission Risk Interventions No flowsheet data found.

## 2020-07-02 NOTE — Progress Notes (Signed)
PROGRESS NOTE    [name]  GHW:[id number] DOB: May 26, 1961 DOA: 06/28/2020 PCP: Charlott Rakes, MD    Chief Complaint  Patient presents with  . Hand Pain  . Weakness    Brief Narrative:  59 year old gentleman with prior history of chronic gouty arthritis of multiple joint, stage II CKD, insulin-dependent diabetes mellitus, hypertension, hyperlipidemia, anemia of chronic disease, iron deficiency anemia, presents to ED for lethargy and multijoint pain. As per the sister-in-law at bedside patient was unable to stand or walk and was brought to ED.  He was found to be in AKI, hyperkalemic and in rhabdomyolysis. He was started on aggressive hydration and his creatinine has improved from 6.38 to 2.69. orthopedics consulted for his left elbow swelling and elbow joint effusion. Currently pt is on steroids for the gouty flare up of the multiple joints. He was also found to have guiac positive stools. GI consulted, recommended outpatient colonoscopy and EGD .  PT evaluation recommending SNF.  Patient seen and examined at bedside no new complaints at this time Elbow pain is improving but not completely resolved.  Assessment & Plan:   Active Problems:   AKI (acute kidney injury) ([place])   Arthralgia   Left elbow pain   Left hand pain   Non-traumatic rhabdomyolysis   AKI on stage II CKD Probably secondary to volume depletion/renal hypoperfusion. Started the patient on IV fluid resuscitation and renal parameters have improved. He was started on aggressive hydration and his creatinine has improved from 6.38 to 2.69 TO 1.95 to 1.4.  Nephrology consulted and recommended to continue with IV fluids.   Ultrasound renal did not show any hydronephrosis at this time. No new complaints at this time.  Bicarb has improved.  Acute hyperkalemia No EKG changes.  Improved with hydration.  Repeat potassium level  Is wnl.  Repeat potassium level is 3.6   Toxic encephalopathy probably uremic in nature  slowly improving with improvement in renal parameters. Resolved.  Patient alert and oriented at this time.   Acute gouty flareup Patient was started on IV Solu-Medrol, transitioned to oral prednisone , recommend 7 to 10 day course of prednisone.   Continue to hold allopurinol, colchicine and hydrochlorothiazide at this time.    Insulin-dependent diabetes mellitus, uncontrolled with hyperglycemia probably secondary to IV steroids CBG (last 3)  Recent Labs    07/02/20 0613 07/02/20 1222 07/02/20 1646  GLUCAP 178* 132* 245*   CBGs are very well controlled at this time.  A1c is 7.1, increased Lantus to 22 units twice daily, well controlled.   Essential hypertension Well-controlled blood pressure parameters.   Iron deficiency anemia, anemia of chronic disease Baseline hemoglobin around 11 in 2020 November.  Gradual decline to 9.7 this admission. Stool for occult blood is positive. Transfuse to keep hemoglobin greater than seven.  Continue to monitor. GI consulted to see if he needs a colonoscopy or EGD, recommended outpatient follow upw ith GI.  Hemoglobin is 10.6 this morning.    Elevated lactic acid Probably secondary to dehydration, normalized with IV fluids.   Nontraumatic rhabdomyolysis with AKI, anion gap metabolic acidosis Improving with IV fluids. CK levels are improving. CK level is 789 metabolic acidosis improving, bicarb level is 26.  Fever Probably secondary to acute gouty flareup of multiple joints Blood cultures have been negative so far. Urine analysis is negative for infection. Chest x-ray does not show an acute cardiopulmonary abnormalities Resolved.    Mildly elevated liver enzymes probably secondary to rhabdomyolysis.    DVT  prophylaxis: (Heparin) Code Status: full code.  Family Communication: none at bedside.  Disposition:   Status is: Inpatient  Remains inpatient appropriate because:IV treatments appropriate due to intensity of illness or  inability to take PO   Dispo: The patient is from: Home              Anticipated d/c is to: SNF              Anticipated d/c date is: 1 day              Patient currently is medically stable to d/c.       Consultants:   Orthopedics  Nephrology  Gastroenterology.    Procedures: none.   Antimicrobials: none.   Subjective: Elbow pain is improving but not completely resolved.  Objective: Vitals:   07/02/20 0350 07/02/20 0827 07/02/20 1223 07/02/20 1650  BP: 108/83 126/81 128/87 124/84  Pulse: 80 80 81 88  Resp: 13 16 16 19   Temp: 98.2 F (36.8 C) 97.7 F (36.5 C) 98.1 F (36.7 C) 98.6 F (37 C)  TempSrc: Oral Oral Oral Oral  SpO2: 94% 96% 96% 95%  Weight:      Height:        Intake/Output Summary (Last 24 hours) at 07/02/2020 1743 Last data filed at 07/02/2020 1345 Gross per 24 hour  Intake 1440 ml  Output 1800 ml  Net -360 ml   Filed Weights   06/28/20 1623 06/28/20 2116  Weight: 116.6 kg 114.2 kg    Examination:  General exam: Alert and comfortable no distress noted Respiratory system: Clear to auscultation bilaterally, no wheezing or rhonchi Cardiovascular system: S1-S2 heard, regular rate rhythm, no JVD Gastrointestinal system: Abdomen is soft, nontender, nondistended, bowel sounds normal. Central nervous system: Alert and oriented, grossly nonfocal Extremities: Left elbow swelling persisting, tenderness improved, pedal edema present Skin: No rashes seen Psychiatry: Mood is appropriate   Data Reviewed: I have personally reviewed following labs and imaging studies  CBC: Recent Labs  Lab 06/28/20 1653 06/28/20 1653 06/28/20 1854 06/29/20 0330 06/30/20 0223 07/01/20 0324 07/02/20 0818  WBC 14.2*  --   --  11.8* 10.5 10.4 8.8  NEUTROABS 11.9*  --   --   --   --   --   --   HGB 10.1*   < > 11.6* 9.4* 9.7* 10.2* 10.6*  HCT 31.3*   < > 34.0* 29.0* 29.2* 30.8* 34.0*  MCV 75.2*  --   --  74.7* 74.3* 74.4* 76.4*  PLT 183  --   --  190 233 180  276   < > = values in this interval not displayed.    Basic Metabolic Panel: Recent Labs  Lab 06/28/20 1855 06/29/20 0330 06/30/20 0223 07/01/20 0324 07/02/20 0818  NA 132* 136 138 141 144  K 3.7 4.1 3.6 3.9 3.6  CL 88* 95* 99 102 106  CO2 24 21* 25 26 27   GLUCOSE 222* 272* 451* 334* 156*  BUN 133* 123* 111* 93* 66*  CREATININE 6.38* 4.92* 2.69* 1.95* 1.49*  CALCIUM 8.9 9.0 9.6 9.6 9.6    GFR: Estimated Creatinine Clearance: 61.6 mL/min (A) (by C-G formula based on SCr of 1.49 mg/dL (H)).  Liver Function Tests: Recent Labs  Lab 06/30/20 0223 07/01/20 0324 07/02/20 0818  AST 151* 110* 69*  ALT 66* 66* 56*  ALKPHOS 69 61 55  BILITOT 0.8 0.6 0.9  PROT 7.1 7.1 7.2  ALBUMIN 2.5* 2.5* 2.7*    CBG: Recent  Labs  Lab 07/01/20 1721 07/01/20 2100 07/02/20 0613 07/02/20 1222 07/02/20 1646  GLUCAP 252* 250* 178* 132* 245*     Recent Results (from the past 240 hour(s))  SARS Coronavirus 2 by RT PCR (hospital order, performed in [facility] hospital lab) Nasopharyngeal Nasopharyngeal Swab     Status: None   Collection Time: 06/28/20  6:43 PM   Specimen: Nasopharyngeal Swab  Result Value Ref Range Status   SARS Coronavirus 2 NEGATIVE NEGATIVE Final    Comment: (NOTE) SARS-CoV-2 target nucleic acids are NOT DETECTED.  The SARS-CoV-2 RNA is generally detectable in upper and lower respiratory specimens during the acute phase of infection. The lowest concentration of SARS-CoV-2 viral copies this assay can detect is 250 copies / mL. A negative result does not preclude SARS-CoV-2 infection and should not be used as the sole basis for treatment or other patient management decisions.  A negative result may occur with improper specimen collection / handling, submission of specimen other than nasopharyngeal swab, presence of viral mutation(s) within the areas targeted by this assay, and inadequate number of viral copies (<250 copies / mL). A negative result must be combined  with clinical observations, patient history, and epidemiological information.  Fact Sheet for Patients:   StrictlyIdeas.no  Fact Sheet for Healthcare Providers: BankingDealers.co.za  This test is not yet approved or  cleared by the [place] FDA and has been authorized for detection and/or diagnosis of SARS-CoV-2 by FDA under an Emergency Use Authorization (EUA).  This EUA will remain in effect (meaning this test can be used) for the duration of the COVID-19 declaration under Section 564(b)(1) of the Act, 21 U.S.C. section 360bbb-3(b)(1), unless the authorization is terminated or revoked sooner.  Performed at [facility], [place] [address], [place], [place] [address]   Culture, Urine     Status: None   Collection Time: 06/28/20 10:00 PM   Specimen: Urine, Catheterized  Result Value Ref Range Status   Specimen Description URINE, CATHETERIZED  Final   Special Requests NONE  Final   Culture   Final    NO GROWTH Performed at [place] [address], [place], [place] [address]    Report Status 06/29/2020 FINAL  Final  Culture, blood (routine x 2)     Status: None (Preliminary result)   Collection Time: 06/28/20 11:51 PM   Specimen: BLOOD RIGHT HAND  Result Value Ref Range Status   Specimen Description BLOOD RIGHT HAND  Final   Special Requests   Final    BOTTLES DRAWN AEROBIC AND ANAEROBIC Blood Culture adequate volume   Culture   Final    NO GROWTH 3 DAYS Performed at [facility], [place] [address] [place], [place] [address]    Report Status PENDING  Incomplete  Culture, blood (routine x 2)     Status: None (Preliminary result)   Collection Time: 06/29/20  3:29 AM   Specimen: BLOOD RIGHT HAND  Result Value Ref Range Status   Specimen Description BLOOD RIGHT HAND  Final   Special Requests   Final    BOTTLES DRAWN AEROBIC AND ANAEROBIC Blood Culture adequate volume   Culture   Final    NO GROWTH 3  DAYS Performed at [facility], [place] [address], [place], [place] [address]    Report Status PENDING  Incomplete         Radiology Studies: No results found.      Scheduled Meds: . aspirin EC  81 mg Oral Daily  . Chlorhexidine Gluconate Cloth  6 each Topical Daily  . ferrous sulfate  325 mg Oral Q breakfast  . heparin  5,000 Units Subcutaneous Q12H  . insulin aspart  0-20 Units Subcutaneous TID WC  . insulin aspart  0-5 Units Subcutaneous QHS  . insulin glargine  22 Units Subcutaneous BID  . loratadine  10 mg Oral Daily  . pantoprazole  40 mg Oral QHS  . traZODone  50 mg Oral QHS   Continuous Infusions:   LOS: 4 days        Hosie Poisson, MD [facility]   To contact the attending provider between 7A-7P or the covering provider during after hours 7P-7A, please log into the web site [email or web address] and access using universal  password for that web site. If you do not have the password, please call the hospital operator.  07/02/2020, 5:43 PM

## 2020-07-03 ENCOUNTER — Inpatient Hospital Stay (HOSPITAL_COMMUNITY): Payer: Medicare HMO

## 2020-07-03 LAB — COMPREHENSIVE METABOLIC PANEL
ALT: 45 U/L — ABNORMAL HIGH (ref 0–44)
AST: 45 U/L — ABNORMAL HIGH (ref 15–41)
Albumin: 2.5 g/dL — ABNORMAL LOW (ref 3.5–5.0)
Alkaline Phosphatase: 60 U/L (ref 38–126)
Anion gap: 13 (ref 5–15)
BUN: 50 mg/dL — ABNORMAL HIGH (ref 6–20)
CO2: 27 mmol/L (ref 22–32)
Calcium: 9.4 mg/dL (ref 8.9–10.3)
Chloride: 103 mmol/L (ref 98–111)
Creatinine, Ser: 1.46 mg/dL — ABNORMAL HIGH (ref 0.61–1.24)
GFR calc Af Amer: 60 mL/min — ABNORMAL LOW (ref >60)
GFR calc non Af Amer: 52 mL/min — ABNORMAL LOW (ref >60)
Glucose, Bld: 209 mg/dL — ABNORMAL HIGH (ref 70–99)
Potassium: 4.2 mmol/L (ref 3.5–5.1)
Sodium: 143 mmol/L (ref 135–145)
Total Bilirubin: 1 mg/dL (ref 0.3–1.2)
Total Protein: 6.9 g/dL (ref 6.5–8.1)

## 2020-07-03 LAB — GLUCOSE, CAPILLARY
Glucose-Capillary: 136 mg/dL — ABNORMAL HIGH (ref 70–99)
Glucose-Capillary: 117 mg/dL — ABNORMAL HIGH (ref 70–99)
Glucose-Capillary: 265 mg/dL — ABNORMAL HIGH (ref 70–99)
Glucose-Capillary: 261 mg/dL — ABNORMAL HIGH (ref 70–99)

## 2020-07-03 LAB — CBC
HCT: 32.3 % — ABNORMAL LOW (ref 39.0–52.0)
Hemoglobin: 10.2 g/dL — ABNORMAL LOW (ref 13.0–17.0)
MCH: 23.9 pg — ABNORMAL LOW (ref 26.0–34.0)
MCHC: 31.6 g/dL (ref 30.0–36.0)
MCV: 75.8 fL — ABNORMAL LOW (ref 80.0–100.0)
Platelets: 278 10*3/uL (ref 150–400)
RBC: 4.26 MIL/uL (ref 4.22–5.81)
RDW: 17.2 % — ABNORMAL HIGH (ref 11.5–15.5)
WBC: 10 10*3/uL (ref 4.0–10.5)
nRBC: 0.3 % — ABNORMAL HIGH (ref 0.0–0.2)

## 2020-07-03 LAB — CK: Total CK: 356 U/L (ref 49–397)

## 2020-07-03 MED ORDER — HEPARIN BOLUS VIA INFUSION
5000.0000 [IU] | Freq: Once | INTRAVENOUS | Status: AC
  Administered 2020-07-03: 5000 [IU] via INTRAVENOUS
  Filled 2020-07-03: qty 5000

## 2020-07-03 MED ORDER — HEPARIN (PORCINE) 25000 UT/250ML-% IV SOLN
1000.0000 [IU]/h | INTRAVENOUS | Status: DC
  Administered 2020-07-03: 1450 [IU]/h via INTRAVENOUS
  Administered 2020-07-04: 1100 [IU]/h via INTRAVENOUS
  Filled 2020-07-03 (×2): qty 250

## 2020-07-03 MED ORDER — PREDNISONE 20 MG PO TABS: 40.0000 mg | ORAL_TABLET | Freq: Every day | ORAL | Status: DC

## 2020-07-03 MED ORDER — PREDNISONE 20 MG PO TABS
60.0000 mg | ORAL_TABLET | Freq: Every day | ORAL | Status: AC
  Administered 2020-07-03 – 2020-07-05 (×3): 60 mg via ORAL
  Filled 2020-07-03 (×3): qty 3

## 2020-07-03 MED ORDER — HEPARIN SODIUM (PORCINE) 5000 UNIT/ML IJ SOLN: 5000.0000 [IU] | Freq: Three times a day (TID) | INTRAMUSCULAR | Status: DC

## 2020-07-03 NOTE — Progress Notes (Signed)
ANTICOAGULATION CONSULT NOTE - Initial Consult  Pharmacy Consult for heparin Indication: DVT  No Known Allergies  Patient Measurements: Height: 5\' 5"  (165.1 cm) Weight: 114.2 kg (251 lb 12.3 oz) IBW/kg (Calculated) : 61.5 Heparin Dosing Weight: 88kg  Vital Signs: Temp: 98 F (36.7 C) (08/16 1121) Temp Source: Oral (08/16 1121) BP: 123/88 (08/16 1121) Pulse Rate: 93 (08/16 1121)  Labs: Recent Labs    07/01/20 0324 07/01/20 0324 07/02/20 0818 07/03/20 0122  HGB 10.2*   < > 10.6* 10.2*  HCT 30.8*  --  34.0* 32.3*  PLT 180  --  276 278  CREATININE 1.95*  --  1.49* 1.46*  CKTOTAL 2,001*  --  708* 356   < > = values in this interval not displayed.    Estimated Creatinine Clearance: 62.9 mL/min (A) (by C-G formula based on SCr of 1.46 mg/dL (H)).   Medical History: Past Medical History:  Diagnosis Date  . Gouty bursitis, elbow  10/18/2015  . Hx of gout   . Hyperlipidemia   . Hypertension   . Knee pain, chronic   . Type II diabetes mellitus ([facility])      Assessment: 53 yoM admitted with AKI and gout flare, now found to have age indeterminate DVT on dopplers 8/16. Pharmacy to begin IV heparin. CBC stable, SQ heparin for VTE ppx given ~1030 this morning.  Goal of Therapy:  Heparin level 0.3-0.7 units/ml Monitor platelets by anticoagulation protocol: Yes   Plan:  -Heparin 5000 units x1 -Heparin 1450 units/hr -Check 6-hr heparin level -Monitor heparin level, CBC, S/Sx bleeding daily   Arrie Senate, PharmD, BCPS Clinical Pharmacist [phone] Please check AMION for all [place] numbers 07/03/2020

## 2020-07-03 NOTE — Progress Notes (Signed)
PROGRESS NOTE    [name]  WUJ:[id number] DOB: [birth year] DOA: 06/28/2020 PCP: Charlott Rakes, MD    Chief Complaint  Patient presents with  . Hand Pain  . Weakness    Brief Narrative:  59 year old gentleman with prior history of chronic gouty arthritis of multiple joint, stage II CKD, insulin-dependent diabetes mellitus, hypertension, hyperlipidemia, anemia of chronic disease, iron deficiency anemia, presents to ED for lethargy and multijoint pain. As per the sister-in-law at bedside patient was unable to stand or walk and was brought to ED.  He was found to be in AKI, hyperkalemic and in rhabdomyolysis. He was started on aggressive hydration and his creatinine has improved from 6.38 to 2.69. orthopedics consulted for his left elbow swelling and elbow joint effusion. Currently pt is on steroids for the gouty flare up of the multiple joints. He was also found to have guiac positive stools. GI consulted, recommended outpatient colonoscopy and EGD .  PT evaluation recommending SNF.  Pt refusing to go to SNF, wants to go home. He reports his elbow pain is worse today, unable to move it. Increased prednisone 40 mg to 60 mg daily.  Ordered venous duplex of the left upper extremity to evaluate for DVT.   Assessment & Plan:   Active Problems:   AKI (acute kidney injury) ([place])   Arthralgia   Left elbow pain   Left hand pain   Non-traumatic rhabdomyolysis   AKI on stage II CKD Probably secondary to volume depletion/renal hypoperfusion. Started the patient on IV fluid resuscitation and renal parameters have improved. He was started on aggressive hydration and his creatinine has improved from 6.38 to 2.69 TO 1.95 to 1.4.  Nephrology consulted and recommended to continue with IV fluids.   Ultrasound renal did not show any hydronephrosis at this time. No new complaints at this time.  Bicarb has improved. Repeat labs today.   Acute hyperkalemia No EKG changes.  Improved with hydration.   Repeat potassium level  Is wnl.  Repeat potassium level is 4.2    Toxic encephalopathy probably uremic in nature slowly improving with improvement in renal parameters. Resolved.    Acute gouty flareup/ multiple joint pain, / left elbow effusion/ left elbow gout flare up.  Patient was started on IV Solu-Medrol, transitioned to oral prednisone , recommend 7 to 10 day course of prednisone. Since patient is having worsening pain int he left elbow , increased his prednisone to 60 mg daily.  Ordered venous duplex to evaluate for DVT.  Orthopedics consulted and recommendations given.   Continue to hold allopurinol, colchicine and hydrochlorothiazide at this time.    Insulin-dependent diabetes mellitus, uncontrolled with hyperglycemia probably secondary to IV steroids CBG (last 3)  Recent Labs    07/02/20 2147 07/03/20 0657 07/03/20 1126  GLUCAP 228* 136* 117*     A1c is 7.1, increased Lantus to 22 units twice daily,  cbgs are better  controlled.   Essential hypertension Well controlled.    Iron deficiency anemia, anemia of chronic disease Baseline hemoglobin around 11 in 2020 November.  Gradual decline to 9.7 this admission. Stool for occult blood is positive. Transfuse to keep hemoglobin greater than seven.  Continue to monitor. GI consulted to see if he needs a colonoscopy or EGD, recommended outpatient follow upw ith GI.  Hemoglobin stable around 10.   Elevated lactic acid Probably secondary to dehydration, normalized with IV fluids.   Nontraumatic rhabdomyolysis with AKI, anion gap metabolic acidosis Improving with IV fluids. CK levels are  improving. CK level is 779 metabolic acidosis improving, bicarb level is 26.  Fever Probably secondary to acute gouty flareup of multiple joints Blood cultures have been negative so far. Urine analysis is negative for infection. Chest x-ray does not show an acute cardiopulmonary abnormalities Resolved.    Mildly elevated liver  enzymes probably secondary to rhabdomyolysis.    DVT prophylaxis: (Heparin) Code Status: full code.  Family Communication: none at bedside.  Disposition:   Status is: Inpatient  Remains inpatient appropriate because:Ongoing diagnostic testing needed not appropriate for outpatient work up and Inpatient level of care appropriate due to severity of illness, worsening pain in the elbow.    Dispo: The patient is from: Home              Anticipated d/c is to: SNF pt refusing SNF , possibly home with home health if he continues to refuse.               Anticipated d/c date is: 1 day              Patient currently is not medically stable to d/c.       Consultants:   Orthopedics  Nephrology  Gastroenterology.    Procedures: none.   Antimicrobials: none.   Subjective: Elbow pain is improving but not completely resolved.  Objective: Vitals:   07/03/20 0300 07/03/20 0400 07/03/20 0751 07/03/20 1121  BP: 127/87 (!) 137/92 128/83 123/88  Pulse: 73 71 80 93  Resp: 13 15 17 15   Temp:  98.1 F (36.7 C) 98 F (36.7 C) 98 F (36.7 C)  TempSrc:  Oral Oral Oral  SpO2: 92% 96% 96% 99%  Weight:      Height:        Intake/Output Summary (Last 24 hours) at 07/03/2020 1434 Last data filed at 07/03/2020 1300 Gross per 24 hour  Intake 240 ml  Output 2700 ml  Net -2460 ml   Filed Weights   06/28/20 1623 06/28/20 2116  Weight: 116.6 kg 114.2 kg    Examination:  General exam: Alert with mild distress from left elbow pain Respiratory system: Clear to auscultation bilaterally, no wheezing or rhonchi Cardiovascular system: S1-S2 heard, regular rate rhythm, no JVD Gastrointestinal system: Abdomen is soft, nontender, bowel sounds normal Central nervous system: Alert and oriented, grossly nonfocal Extremities: Left elbow swelling persistent and worsening tenderness present Skin: No rashes seen Psychiatry: Mood isappropriate   Data Reviewed: I have personally reviewed following  labs and imaging studies  CBC: Recent Labs  Lab 06/28/20 1653 06/28/20 1854 06/29/20 0330 06/30/20 0223 07/01/20 0324 07/02/20 0818 07/03/20 0122  WBC 14.2*   < > 11.8* 10.5 10.4 8.8 10.0  NEUTROABS 11.9*  --   --   --   --   --   --   HGB 10.1*   < > 9.4* 9.7* 10.2* 10.6* 10.2*  HCT 31.3*   < > 29.0* 29.2* 30.8* 34.0* 32.3*  MCV 75.2*   < > 74.7* 74.3* 74.4* 76.4* 75.8*  PLT 183   < > 190 233 180 276 278   < > = values in this interval not displayed.    Basic Metabolic Panel: Recent Labs  Lab 06/29/20 0330 06/30/20 0223 07/01/20 0324 07/02/20 0818 07/03/20 0122  NA 136 138 141 144 143  K 4.1 3.6 3.9 3.6 4.2  CL 95* 99 102 106 103  CO2 21* 25 26 27 27   GLUCOSE 272* 451* 334* 156* 209*  BUN 123* 111* 93*  66* 50*  CREATININE 4.92* 2.69* 1.95* 1.49* 1.46*  CALCIUM 9.0 9.6 9.6 9.6 9.4    GFR: Estimated Creatinine Clearance: 62.9 mL/min (A) (by C-G formula based on SCr of 1.46 mg/dL (H)).  Liver Function Tests: Recent Labs  Lab 06/30/20 0223 07/01/20 0324 07/02/20 0818 07/03/20 0122  AST 151* 110* 69* 45*  ALT 66* 66* 56* 45*  ALKPHOS 69 61 55 60  BILITOT 0.8 0.6 0.9 1.0  PROT 7.1 7.1 7.2 6.9  ALBUMIN 2.5* 2.5* 2.7* 2.5*    CBG: Recent Labs  Lab 07/02/20 1222 07/02/20 1646 07/02/20 2147 07/03/20 0657 07/03/20 1126  GLUCAP 132* 245* 228* 136* 117*     Recent Results (from the past 240 hour(s))  SARS Coronavirus 2 by RT PCR (hospital order, performed in [facility] hospital lab) Nasopharyngeal Nasopharyngeal Swab     Status: None   Collection Time: 06/28/20  6:43 PM   Specimen: Nasopharyngeal Swab  Result Value Ref Range Status   SARS Coronavirus 2 NEGATIVE NEGATIVE Final    Comment: (NOTE) SARS-CoV-2 target nucleic acids are NOT DETECTED.  The SARS-CoV-2 RNA is generally detectable in upper and lower respiratory specimens during the acute phase of infection. The lowest concentration of SARS-CoV-2 viral copies this assay can detect is  250 copies / mL. A negative result does not preclude SARS-CoV-2 infection and should not be used as the sole basis for treatment or other patient management decisions.  A negative result may occur with improper specimen collection / handling, submission of specimen other than nasopharyngeal swab, presence of viral mutation(s) within the areas targeted by this assay, and inadequate number of viral copies (<250 copies / mL). A negative result must be combined with clinical observations, patient history, and epidemiological information.  Fact Sheet for Patients:   StrictlyIdeas.no  Fact Sheet for Healthcare Providers: BankingDealers.co.za  This test is not yet approved or  cleared by the [place] FDA and has been authorized for detection and/or diagnosis of SARS-CoV-2 by FDA under an Emergency Use Authorization (EUA).  This EUA will remain in effect (meaning this test can be used) for the duration of the COVID-19 declaration under Section 564(b)(1) of the Act, 21 U.S.C. section 360bbb-3(b)(1), unless the authorization is terminated or revoked sooner.  Performed at [facility], [place] [address], [place], [place] [address]   Culture, Urine     Status: None   Collection Time: 06/28/20 10:00 PM   Specimen: Urine, Catheterized  Result Value Ref Range Status   Specimen Description URINE, CATHETERIZED  Final   Special Requests NONE  Final   Culture   Final    NO GROWTH Performed at [place] [address] [place], [place] [address]    Report Status 06/29/2020 FINAL  Final  Culture, blood (routine x 2)     Status: None (Preliminary result)   Collection Time: 06/28/20 11:51 PM   Specimen: BLOOD RIGHT HAND  Result Value Ref Range Status   Specimen Description BLOOD RIGHT HAND  Final   Special Requests   Final    BOTTLES DRAWN AEROBIC AND ANAEROBIC Blood Culture adequate volume   Culture   Final    NO GROWTH 4  DAYS Performed at [facility], [place] [address] [place], [place] [address]    Report Status PENDING  Incomplete  Culture, blood (routine x 2)     Status: None (Preliminary result)   Collection Time: 06/29/20  3:29 AM   Specimen: BLOOD RIGHT  HAND  Result Value Ref Range Status   Specimen Description BLOOD RIGHT HAND  Final   Special Requests   Final    BOTTLES DRAWN AEROBIC AND ANAEROBIC Blood Culture adequate volume   Culture   Final    NO GROWTH 4 DAYS Performed at [facility], [address] [place], [place] [address]    Report Status PENDING  Incomplete         Radiology Studies: No results found.      Scheduled Meds: . aspirin EC  81 mg Oral Daily  . Chlorhexidine Gluconate Cloth  6 each Topical Daily  . ferrous sulfate  325 mg Oral Q breakfast  . heparin  5,000 Units Subcutaneous Q8H  . insulin aspart  0-20 Units Subcutaneous TID WC  . insulin aspart  0-5 Units Subcutaneous QHS  . insulin glargine  22 Units Subcutaneous BID  . loratadine  10 mg Oral Daily  . pantoprazole  40 mg Oral QHS  . predniSONE  60 mg Oral QAC breakfast  . traZODone  50 mg Oral QHS   Continuous Infusions:   LOS: 5 days        Hosie Poisson, MD [facility]   To contact the attending provider between 7A-7P or the covering provider during after hours 7P-7A, please log into the web site [email or web address] and access using universal [place] password for that web site. If you do not have the password, please call the hospital operator.  07/03/2020, 2:34 PM

## 2020-07-03 NOTE — Progress Notes (Signed)
Physical Therapy Treatment Patient Details Name: [name] MRN: [id number] DOB: 06/28/1961 Today's Date: 07/03/2020    History of Present Illness Pt is 59 y.o. male presenting with change in mentation and worsening of multiple joint pain. Patient admitted with AKI on CKD II likely ATN, acute hyperkalemia, toxic encephalopathy, and acute gouty flare-up. On 07/03/20 around 1530 pt found to have age indeterminate DVT in L UE.  PMHx significant for chronic gouty arthritis of multiple joints, CKD II, IDDM, HTN, HLD, and chronic anemia.    PT Comments    Pt with limited mobility due to pain and fatigue today.  He had just returned to bed with heavy assist of 2 per RN.  Additionally, while in room pt asked question - when therapist looked in chart noted new age indeterminate DVT and heparin doses were being adjusted - held further OOB activity and exercise at that point due to newly dx DVT.   Pt did not participate with OOB activity but demonstrates significant weakness in bil LE as well as L UE and was painful throughout.  Pt is normally independent and with good support at home.  Reports he wants to go home but will think about rehab at d/c. Updated recommendation for CIR.    Follow Up Recommendations  CIR     Equipment Recommendations  None recommended by PT    Recommendations for Other Services Rehab consult     Precautions / Restrictions Precautions Precautions: Fall Restrictions LUE Weight Bearing: Weight bearing as tolerated    Mobility  Bed Mobility               General bed mobility comments: Pt declined due to just getting back in bed.  Spoke with RN who reports pt was a heavy assist of 2 to move from chair to bed.  Transfers                    Ambulation/Gait                 Stairs             Wheelchair Mobility    Modified Rankin (Stroke Patients Only)       Balance       Sitting balance - Comments: declined                                     Cognition Arousal/Alertness: Awake/alert Behavior During Therapy: WFL for tasks assessed/performed Overall Cognitive Status: Within Functional Limits for tasks assessed                                        Exercises General Exercises - Upper Extremity Shoulder Flexion: AROM;Right;AAROM;Left;10 reps;Supine Shoulder ABduction: AROM;Right;AAROM;Left;10 reps Elbow Flexion: AROM;Right;AAROM;Left;10 reps Elbow Extension: AROM;Right;AAROM;Left;10 reps General Exercises - Lower Extremity Ankle Circles/Pumps: AROM;Left;AAROM;Right;10 reps;Supine (limited range and painful on R - reports arthritis) Heel Slides: AROM;Strengthening;Both;10 reps (AROM for flexion phase and min resistance for extension - unable to push more than min resistance) Hip ABduction/ADduction: AAROM;Both;10 reps;Supine Other Exercises Other Exercises: pillow squeeze x 10    General Comments General comments (skin integrity, edema, etc.): This therapist reviewed chart earlier today, later DVT was documented.  During session, saw documentation of age indeterminate DVT in L UE.  Did note pt has been on heparin  but doses are being adjusted. Held further activity at that point.       Pertinent Vitals/Pain Pain Assessment: 0-10 Pain Score: 7  Pain Location: All joints; L UE worse Pain Descriptors / Indicators: Aching;Grimacing Pain Intervention(s): Limited activity within patient's tolerance;Monitored during session;Repositioned    Home Living                      Prior Function            PT Goals (current goals can now be found in the care plan section) Acute Rehab PT Goals Patient Stated Goal: Pt agreeable to consider CIR but wants to talk to "my better half" PT Goal Formulation: With patient Time For Goal Achievement: 07/14/20 Potential to Achieve Goals: Good Progress towards PT goals: Not progressing toward goals - comment (limited due to pain)     Frequency    Min 3X/week      PT Plan Discharge plan needs to be updated (pt declining SNF but would consider CIR)    Co-evaluation              AM-PAC PT "6 Clicks" Mobility   Outcome Measure  Help needed turning from your back to your side while in a flat bed without using bedrails?: A Little Help needed moving from lying on your back to sitting on the side of a flat bed without using bedrails?: A Lot Help needed moving to and from a bed to a chair (including a wheelchair)?: Total Help needed standing up from a chair using your arms (e.g., wheelchair or bedside chair)?: Total Help needed to walk in hospital room?: Total Help needed climbing 3-5 steps with a railing? : Total 6 Click Score: 9    End of Session   Activity Tolerance: Patient limited by pain;Other (comment) (limited due to pain , fatigue, and had just returned to bed; while in room saw new results of DVT so held further mobilti) Patient left: in bed;with call bell/phone within reach;with bed alarm set Nurse Communication: Mobility status PT Visit Diagnosis: Unsteadiness on feet (R26.81);Muscle weakness (generalized) (M62.81);Difficulty in walking, not elsewhere classified (R26.2);Pain     Time: 1550-1609 PT Time Calculation (min) (ACUTE ONLY): 19 min  Charges:  $Therapeutic Exercise: 8-22 mins                     Abran Richard, PT Acute Rehab Services Pager [phone] [facility] 07/03/2020, 4:32 PM

## 2020-07-03 NOTE — Progress Notes (Signed)
Upper extremity venous LT study completed  Preliminary results relayed to Karleen Hampshire, MD.  See CV Proc for preliminary results report.   [name]

## 2020-07-03 NOTE — Care Management Important Message (Signed)
Important Message  Patient Details  Name: [name] MRN: [id number] Date of Birth: 26-Sep-1961   Medicare Important Message Given:  Yes     [name] 07/03/2020, 11:39 AM

## 2020-07-04 LAB — CULTURE, BLOOD (ROUTINE X 2)
Culture: NO GROWTH
Culture: NO GROWTH
Special Requests: ADEQUATE
Special Requests: ADEQUATE

## 2020-07-04 LAB — CBC
HCT: 34.4 % — ABNORMAL LOW (ref 39.0–52.0)
Hemoglobin: 10.9 g/dL — ABNORMAL LOW (ref 13.0–17.0)
MCH: 24.3 pg — ABNORMAL LOW (ref 26.0–34.0)
MCHC: 31.7 g/dL (ref 30.0–36.0)
MCV: 76.6 fL — ABNORMAL LOW (ref 80.0–100.0)
Platelets: 322 10*3/uL (ref 150–400)
RBC: 4.49 MIL/uL (ref 4.22–5.81)
RDW: 17.7 % — ABNORMAL HIGH (ref 11.5–15.5)
WBC: 14.4 10*3/uL — ABNORMAL HIGH (ref 4.0–10.5)
nRBC: 0.5 % — ABNORMAL HIGH (ref 0.0–0.2)

## 2020-07-04 LAB — GLUCOSE, CAPILLARY
Glucose-Capillary: 169 mg/dL — ABNORMAL HIGH (ref 70–99)
Glucose-Capillary: 153 mg/dL — ABNORMAL HIGH (ref 70–99)
Glucose-Capillary: 128 mg/dL — ABNORMAL HIGH (ref 70–99)
Glucose-Capillary: 278 mg/dL — ABNORMAL HIGH (ref 70–99)
Glucose-Capillary: 258 mg/dL — ABNORMAL HIGH (ref 70–99)

## 2020-07-04 LAB — BASIC METABOLIC PANEL
Anion gap: 10 (ref 5–15)
BUN: 38 mg/dL — ABNORMAL HIGH (ref 6–20)
CO2: 25 mmol/L (ref 22–32)
Calcium: 9.3 mg/dL (ref 8.9–10.3)
Chloride: 105 mmol/L (ref 98–111)
Creatinine, Ser: 1.29 mg/dL — ABNORMAL HIGH (ref 0.61–1.24)
GFR calc Af Amer: >60 mL/min (ref >60)
GFR calc non Af Amer: 60 mL/min — ABNORMAL LOW (ref >60)
Glucose, Bld: 142 mg/dL — ABNORMAL HIGH (ref 70–99)
Potassium: 4 mmol/L (ref 3.5–5.1)
Sodium: 140 mmol/L (ref 135–145)

## 2020-07-04 LAB — HEPARIN LEVEL (UNFRACTIONATED)
Heparin Unfractionated: 1.36 IU/mL — ABNORMAL HIGH (ref 0.30–0.70)
Heparin Unfractionated: 0.38 IU/mL (ref 0.30–0.70)

## 2020-07-04 NOTE — Progress Notes (Signed)
Inpatient Rehab Admissions Coordinator:   Met with patient to discuss potential CIR admit. Pt. Stated initially that he is significant other, [name], could provide 24/7 support at discharge; however, when I spoke with her over the phone, she stated that she is his roommate not his significant other and that she can provide only intermittent support. In addition lack of support at home, Pt. Does not demonstrate medical necessity for CIR stay. Pt. Stated understanding and that he would be interested in short term rehab at a local SNF if he needs it. CIR will sign off.   Clemens Catholic, [place], [place] Admissions Coordinator  [phone] ([place]) [phone] (office)

## 2020-07-04 NOTE — TOC Progression Note (Signed)
Transition of Care [facility]) - Progression Note    Patient Details  Name: [name] MRN: [id number] Date of Birth: 19-Jun-1961  Transition of Care [facility] [place]) CM/SW [place], [place] Phone Number: 07/04/2020, 4:57 PM  Clinical Narrative:     Patient states he is agreeable to short term rehab at [facility]. CSW gave [place] to review for SNF choice. CSW will follow up with patient tomorrow and further explain the SNF process.  Thurmond Butts, MSW, LCSWA Clinical Social Worker   Expected Discharge Plan: Home w Home Health Services Barriers to Discharge: Ship broker, Continued Medical Work up  Expected Discharge Plan and Services Expected Discharge Plan: [place] In-house Referral: Clinical Social Work                                             Social Determinants of Health (SDOH) Interventions    Readmission Risk Interventions No flowsheet data found.

## 2020-07-04 NOTE — Progress Notes (Signed)
ANTICOAGULATION CONSULT NOTE Pharmacy Consult for heparin Indication: DVT  No Known Allergies  Patient Measurements: Height: 5\' 5"  (165.1 cm) Weight: 114.2 kg (251 lb 12.3 oz) IBW/kg (Calculated) : 61.5 Heparin Dosing Weight: 88kg  Vital Signs: Temp: 98.1 F (36.7 C) (08/16 2320) Temp Source: Oral (08/16 2320) BP: 110/70 (08/16 2320) Pulse Rate: 73 (08/16 2320)  Labs: Recent Labs    07/01/20 0324 07/01/20 0324 07/02/20 0818 07/03/20 0122 07/04/20 0041  HGB 10.2*   < > 10.6* 10.2*  --   HCT 30.8*  --  34.0* 32.3*  --   PLT 180  --  276 278  --   HEPARINUNFRC  --   --   --   --  1.36*  CREATININE 1.95*  --  1.49* 1.46*  --   CKTOTAL 2,001*  --  708* 356  --    < > = values in this interval not displayed.    Estimated Creatinine Clearance: 62.9 mL/min (A) (by C-G formula based on SCr of 1.46 mg/dL (H)).  Assessment: 59 y.o. male with DV T for heparin.  Goal of Therapy:  Heparin level 0.3-0.7 units/ml Monitor platelets by anticoagulation protocol: Yes   Plan:  Decrease Heparin 1200 units/hr Check heparin level in 6 hours.   Phillis Knack, PharmD, BCPS  07/04/2020

## 2020-07-04 NOTE — Progress Notes (Signed)
ANTICOAGULATION CONSULT NOTE  Pharmacy Consult for heparin Indication: DVT  No Known Allergies  Patient Measurements: Height: 5\' 5"  (165.1 cm) Weight: 114.2 kg (251 lb 12.3 oz) IBW/kg (Calculated) : 61.5 Heparin Dosing Weight: 88kg  Vital Signs: Temp: 98.4 F (36.9 C) (08/17 2100) Temp Source: Oral (08/17 2100) BP: 103/80 (08/17 2100) Pulse Rate: 83 (08/17 2100)  Labs: Recent Labs    07/02/20 0818 07/02/20 0818 07/03/20 0122 07/04/20 0041 07/04/20 0926 07/04/20 2134  HGB 10.6*   < > 10.2*  --  10.9*  --   HCT 34.0*  --  32.3*  --  34.4*  --   PLT 276  --  278  --  322  --   HEPARINUNFRC  --   --   --  1.36*  --  0.38  CREATININE 1.49*  --  1.46*  --  1.29*  --   CKTOTAL 708*  --  356  --   --   --    < > = values in this interval not displayed.    Estimated Creatinine Clearance: 71.1 mL/min (A) (by C-G formula based on SCr of 1.29 mg/dL (H)).   Medical History: Past Medical History:  Diagnosis Date  . Gouty bursitis, elbow  10/18/2015  . Hx of gout   . Hyperlipidemia   . Hypertension   . Knee pain, chronic   . Type II diabetes mellitus (HCC)      Assessment: 89 yoM admitted with AKI and gout flare, now found to have age indeterminate DVT on dopplers 8/16. Pharmacy to begin IV heparin. CBC stable, SQ heparin for VTE ppx given ~1030 this morning.  Heparin held for nasal bleeding, resumed earlier today and therapeutic on recheck.  Goal of Therapy:  Heparin level 0.3-0.5 units/ml Monitor platelets by anticoagulation protocol: Yes   Plan:  -Continue heparin 1100 units/h -Daily heparin level and CBC   Arrie Senate, PharmD, BCPS Clinical Pharmacist [phone] Please check AMION for all [place] numbers 07/04/2020

## 2020-07-04 NOTE — Progress Notes (Addendum)
ANTICOAGULATION CONSULT NOTE  Pharmacy Consult for heparin Indication: DVT  No Known Allergies  Patient Measurements: Height: 5\' 5"  (165.1 cm) Weight: 114.2 kg (251 lb 12.3 oz) IBW/kg (Calculated) : 61.5 Heparin Dosing Weight: 88kg  Vital Signs: Temp: 98.5 F (36.9 C) (08/17 1133) Temp Source: Oral (08/17 1133) BP: 100/64 (08/17 1133) Pulse Rate: 82 (08/17 1133)  Labs: Recent Labs    07/02/20 0818 07/02/20 0818 07/03/20 0122 07/04/20 0041 07/04/20 0926  HGB 10.6*   < > 10.2*  --  10.9*  HCT 34.0*  --  32.3*  --  34.4*  PLT 276  --  278  --  322  HEPARINUNFRC  --   --   --  1.36*  --   CREATININE 1.49*  --  1.46*  --  1.29*  CKTOTAL 708*  --  356  --   --    < > = values in this interval not displayed.    Estimated Creatinine Clearance: 71.1 mL/min (A) (by C-G formula based on SCr of 1.29 mg/dL (H)).   Medical History: Past Medical History:  Diagnosis Date  . Gouty bursitis, elbow  10/18/2015  . Hx of gout   . Hyperlipidemia   . Hypertension   . Knee pain, chronic   . Type II diabetes mellitus (HCC)      Assessment: 68 yoM admitted with AKI and gout flare, now found to have age indeterminate DVT on dopplers 8/16. Pharmacy to begin IV heparin. CBC stable, SQ heparin for VTE ppx given ~1030 this morning.  Heparin has been on hold since 0615 given nose bleed - ENT following and plan for possible nasal packing. Discussed with hospitalist and continue to hold until plan for bleeding determined.   Goal of Therapy:  Heparin level 0.3-0.7 units/ml Monitor platelets by anticoagulation protocol: Yes   Plan:  -Continue to hold heparin -Pharmacy will follow along for timing of restart pending recommendations  Antonietta Jewel, PharmD, [place] Pharmacist  Phone: [phone] 07/04/2020 1:55 PM  Please check AMION for all [facility] phone numbers After 10:00 PM, call [facility] to restart heparin infusion at this time per MD. Will  restart infusion at 1100 units/hr and get a level in 6 hours. Nose bleed has now stabilized. Will target lower range given nose bleed.   Antonietta Jewel, PharmD, [place] Clinical Pharmacist

## 2020-07-04 NOTE — Progress Notes (Signed)
PROGRESS NOTE    [name]  ZOX:[id number] DOB: [birth year] DOA: 06/28/2020 PCP: Charlott Rakes, MD    Chief Complaint  Patient presents with  . Hand Pain  . Weakness    Brief Narrative:  59 year old gentleman with prior history of chronic gouty arthritis of multiple joint, stage II CKD, insulin-dependent diabetes mellitus, hypertension, hyperlipidemia, anemia of chronic disease, iron deficiency anemia, presents to ED for lethargy and multijoint pain. As per the sister-in-law at bedside patient was unable to stand or walk and was brought to ED.  He was found to be in AKI, hyperkalemic and in rhabdomyolysis. He was started on aggressive hydration and his creatinine has improved from 6.38 to 2.69. orthopedics consulted for his left elbow swelling and elbow joint effusion. Currently pt is on steroids for the gouty flare up of the multiple joints. He was also found to have guiac positive stools. GI consulted, recommended outpatient colonoscopy and EGD .  PT evaluation recommending SNF.  Pt refusing to go to SNF, wants to go home. He reports his elbow pain is worse today, unable to move it. Increased prednisone 40 mg to 60 mg daily.  Ordered venous duplex of the left upper extremity to evaluate for DVT, it was positive for DVT, he was started on IV heparin but it was held for right sided nostril bleeding. ENT consulted, and  waas found to have a clot in the right nostril, which was suctioned out.  No need for packing and bleeding resolved.  Will restart IV heparin today and check for recurrent bleeding.    Assessment & Plan:   Active Problems:   AKI (acute kidney injury) ([place])   Arthralgia   Left elbow pain   Left hand pain   Non-traumatic rhabdomyolysis   AKI on stage II CKD Probably secondary to volume depletion/renal hypoperfusion. Started the patient on IV fluid resuscitation and renal parameters have improved. He was started on aggressive hydration and his creatinine has  improved from 6.38 to 2.69 TO 1.95 to 1.4. to 1.2.  Nephrology consulted and have signed off.     Ultrasound renal did not show any hydronephrosis at this time.   Bicarb has improved.   Acute hyperkalemia No EKG changes.  Improved with hydration.repeat potassium wnl.    Toxic encephalopathy probably uremic in nature slowly improving with improvement in renal parameters. Resolved.    Acute gouty flareup/ multiple joint pain, / left elbow effusion/ left elbow gout flare up.  Patient was started on IV Solu-Medrol, transitioned to oral prednisone , recommend 7 to 10 day course of prednisone. Since patient is having worsening pain int he left elbow , increased his prednisone to 60 mg daily.  Ordered venous duplex to evaluate for DVT, was found to have DVT in the left brachial veins .  Orthopedics consulted and recommendations given.   Continue to hold allopurinol, colchicine and hydrochlorothiazide at this time.    Insulin-dependent diabetes mellitus, uncontrolled with hyperglycemia probably secondary to IV steroids CBG (last 3)  Recent Labs    07/04/20 0607 07/04/20 0813 07/04/20 1130  GLUCAP 169* 153* 128*     A1c is 7.1, increased Lantus to 22 units twice daily,  cbgs are better  controlled.   Essential hypertension Well controlled.    Iron deficiency anemia, anemia of chronic disease Baseline hemoglobin around 11 in 2020 November.  Gradual decline to 9.7 this admission. Stool for occult blood is positive. Transfuse to keep hemoglobin greater than seven.  Continue to monitor. GI  consulted to see if he needs a colonoscopy or EGD, recommended outpatient follow upw ith GI.  Hemoglobin stable around 10.   Elevated lactic acid Probably secondary to dehydration, normalized with IV fluids.   Nontraumatic rhabdomyolysis with AKI, anion gap metabolic acidosis Improving with IV fluids. CK levels are improving. CK level is 962 metabolic acidosis improving, bicarb level is  26.  Fever Probably secondary to acute gouty flareup of multiple joints Blood cultures have been negative so far. Urine analysis is negative for infection. Chest x-ray does not show an acute cardiopulmonary abnormalities Resolved.    Mildly elevated liver enzymes probably secondary to rhabdomyolysis.   Left brachial vein DVT of indeterminate age:  Started him on IV heparin, but earlier this am, started having nose bleeds. IV heparin was held and ENT consulted for recommendations.     DVT prophylaxis: (Heparin) Code Status: full code.  Family Communication: none at bedside.  Disposition:   Status is: Inpatient  Remains inpatient appropriate because:Ongoing diagnostic testing needed not appropriate for outpatient work up and Inpatient level of care appropriate due to severity of illness, worsening pain in the elbow.    Dispo: The patient is from: Home              Anticipated d/c is to: CIR pt refusing SNF , will see if he is a candidate for CIR.               Anticipated d/c date is: 1 day              Patient currently is not medically stable to d/c.       Consultants:   Orthopedics  Nephrology  Gastroenterology.    Procedures: none.   Antimicrobials: none.   Subjective: ELBOW PAIN IS improving, nose bleeding started this am.   Objective: Vitals:   07/03/20 2320 07/04/20 0343 07/04/20 0846 07/04/20 1133  BP: 110/70 113/87 102/76 100/64  Pulse: 73 76 80 82  Resp: 15 15 16 14   Temp: 98.1 F (36.7 C) 97.6 F (36.4 C) 98 F (36.7 C) 98.5 F (36.9 C)  TempSrc: Oral Oral Oral Oral  SpO2: 94% 90% 97% 100%  Weight:      Height:        Intake/Output Summary (Last 24 hours) at 07/04/2020 1513 Last data filed at 07/04/2020 1140 Gross per 24 hour  Intake 120 ml  Output 2150 ml  Net -2030 ml   Filed Weights   06/28/20 1623 06/28/20 2116  Weight: 116.6 kg 114.2 kg    Examination:  General exam: Alert and comfortable, not in distress. Respiratory  system: Clear to auscultation bilaterally, no wheezing or rhonchi Cardiovascular system: S1 S2 heard, regular rate rhythm, no JVD Gastrointestinal system: Abdomen is soft, nontender, nondistended, bowel sounds normal Central nervous system: Alert and oriented, grossly nonfocal Extremities: Left elbow swelling persistent, tenderness improving Skin: No rashes seen Psychiatry: Mood is appropriate  Data Reviewed: I have personally reviewed following labs and imaging studies  CBC: Recent Labs  Lab 06/28/20 1653 06/28/20 1854 06/30/20 0223 07/01/20 0324 07/02/20 0818 07/03/20 0122 07/04/20 0926  WBC 14.2*   < > 10.5 10.4 8.8 10.0 14.4*  NEUTROABS 11.9*  --   --   --   --   --   --   HGB 10.1*   < > 9.7* 10.2* 10.6* 10.2* 10.9*  HCT 31.3*   < > 29.2* 30.8* 34.0* 32.3* 34.4*  MCV 75.2*   < > 74.3* 74.4* 76.4*  75.8* 76.6*  PLT 183   < > 233 180 276 278 322   < > = values in this interval not displayed.    Basic Metabolic Panel: Recent Labs  Lab 06/30/20 0223 07/01/20 0324 07/02/20 0818 07/03/20 0122 07/04/20 0926  NA 138 141 144 143 140  K 3.6 3.9 3.6 4.2 4.0  CL 99 102 106 103 105  CO2 25 26 27 27 25   GLUCOSE 451* 334* 156* 209* 142*  BUN 111* 93* 66* 50* 38*  CREATININE 2.69* 1.95* 1.49* 1.46* 1.29*  CALCIUM 9.6 9.6 9.6 9.4 9.3    GFR: Estimated Creatinine Clearance: 71.1 mL/min (A) (by C-G formula based on SCr of 1.29 mg/dL (H)).  Liver Function Tests: Recent Labs  Lab 06/30/20 0223 07/01/20 0324 07/02/20 0818 07/03/20 0122  AST 151* 110* 69* 45*  ALT 66* 66* 56* 45*  ALKPHOS 69 61 55 60  BILITOT 0.8 0.6 0.9 1.0  PROT 7.1 7.1 7.2 6.9  ALBUMIN 2.5* 2.5* 2.7* 2.5*    CBG: Recent Labs  Lab 07/03/20 1647 07/03/20 2123 07/04/20 0607 07/04/20 0813 07/04/20 1130  GLUCAP 265* 261* 169* 153* 128*     Recent Results (from the past 240 hour(s))  SARS Coronavirus 2 by RT PCR (hospital order, performed in [facility] hospital lab) Nasopharyngeal  Nasopharyngeal Swab     Status: None   Collection Time: 06/28/20  6:43 PM   Specimen: Nasopharyngeal Swab  Result Value Ref Range Status   SARS Coronavirus 2 NEGATIVE NEGATIVE Final    Comment: (NOTE) SARS-CoV-2 target nucleic acids are NOT DETECTED.  The SARS-CoV-2 RNA is generally detectable in upper and lower respiratory specimens during the acute phase of infection. The lowest concentration of SARS-CoV-2 viral copies this assay can detect is 250 copies / mL. A negative result does not preclude SARS-CoV-2 infection and should not be used as the sole basis for treatment or other patient management decisions.  A negative result may occur with improper specimen collection / handling, submission of specimen other than nasopharyngeal swab, presence of viral mutation(s) within the areas targeted by this assay, and inadequate number of viral copies (<250 copies / mL). A negative result must be combined with clinical observations, patient history, and epidemiological information.  Fact Sheet for Patients:   StrictlyIdeas.no  Fact Sheet for Healthcare Providers: BankingDealers.co.za  This test is not yet approved or  cleared by the [place] FDA and has been authorized for detection and/or diagnosis of SARS-CoV-2 by FDA under an Emergency Use Authorization (EUA).  This EUA will remain in effect (meaning this test can be used) for the duration of the COVID-19 declaration under Section 564(b)(1) of the Act, 21 U.S.C. section 360bbb-3(b)(1), unless the authorization is terminated or revoked sooner.  Performed at [facility], [place] [address], [place], [place] [address]   Culture, Urine     Status: None   Collection Time: 06/28/20 10:00 PM   Specimen: Urine, Catheterized  Result Value Ref Range Status   Specimen Description URINE, CATHETERIZED  Final   Special Requests NONE  Final   Culture   Final    NO GROWTH Performed at  [place] [address] [place], [place] [address]    Report Status 06/29/2020 FINAL  Final  Culture, blood (routine x 2)     Status: None   Collection Time: 06/28/20 11:51 PM   Specimen: BLOOD RIGHT HAND  Result Value Ref Range Status   Specimen Description BLOOD RIGHT  HAND  Final   Special Requests   Final    BOTTLES DRAWN AEROBIC AND ANAEROBIC Blood Culture adequate volume   Culture   Final    NO GROWTH 5 DAYS Performed at [facility], [address], [place], [place] [address]    Report Status 07/04/2020 FINAL  Final  Culture, blood (routine x 2)     Status: None   Collection Time: 06/29/20  3:29 AM   Specimen: BLOOD RIGHT HAND  Result Value Ref Range Status   Specimen Description BLOOD RIGHT HAND  Final   Special Requests   Final    BOTTLES DRAWN AEROBIC AND ANAEROBIC Blood Culture adequate volume   Culture   Final    NO GROWTH 5 DAYS Performed at [facility], [place] [address], [place], [place] [address]    Report Status 07/04/2020 FINAL  Final         Radiology Studies: VAS [place] UPPER EXTREMITY VENOUS DUPLEX  Result Date: 07/04/2020 UPPER VENOUS STUDY  Indications: Swelling Comparison Study: No prior studies. Performing Technologist: Darlin Coco  Examination Guidelines: A complete evaluation includes B-mode imaging, spectral Doppler, color Doppler, and power Doppler as needed of all accessible portions of each vessel. Bilateral testing is considered an integral part of a complete examination. Limited examinations for reoccurring indications may be performed as noted.  Right Findings: +----------+------------+---------+-----------+----------+-------+ RIGHT     CompressiblePhasicitySpontaneousPropertiesSummary +----------+------------+---------+-----------+----------+-------+ Subclavian               Yes       Yes                      +----------+------------+---------+-----------+----------+-------+  Left Findings:  +----------+------------+---------+-----------+----------+-----------------+ LEFT      CompressiblePhasicitySpontaneousProperties     Summary      +----------+------------+---------+-----------+----------+-----------------+ IJV           Full       Yes       Yes                                +----------+------------+---------+-----------+----------+-----------------+ Subclavian               Yes       Yes                                +----------+------------+---------+-----------+----------+-----------------+ Axillary      Full       Yes       Yes                                +----------+------------+---------+-----------+----------+-----------------+ Brachial      None       No        No               Age Indeterminate +----------+------------+---------+-----------+----------+-----------------+ Radial        Full                                                    +----------+------------+---------+-----------+----------+-----------------+ Ulnar         Full                                                    +----------+------------+---------+-----------+----------+-----------------+  Cephalic      Full                                                    +----------+------------+---------+-----------+----------+-----------------+ Basilic       Full                                                    +----------+------------+---------+-----------+----------+-----------------+  Summary:  Right: No evidence of thrombosis in the subclavian.  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  *See table(s) above for measurements and observations.  Diagnosing physician: Deitra Mayo MD Electronically signed by Deitra Mayo MD on 07/04/2020 at 7:59:24 AM.    Final         Scheduled Meds: . aspirin EC  81 mg Oral Daily  . Chlorhexidine Gluconate Cloth  6 each Topical Daily  . ferrous  sulfate  325 mg Oral Q breakfast  . insulin aspart  0-20 Units Subcutaneous TID WC  . insulin aspart  0-5 Units Subcutaneous QHS  . insulin glargine  22 Units Subcutaneous BID  . loratadine  10 mg Oral Daily  . pantoprazole  40 mg Oral QHS  . predniSONE  60 mg Oral QAC breakfast  . traZODone  50 mg Oral QHS   Continuous Infusions: . heparin Stopped (07/04/20 0615)     LOS: 6 days        Hosie Poisson, MD [facility]   To contact the attending provider between 7A-7P or the covering provider during after hours 7P-7A, please log into the web site [email or web address] and access using universal [place] password for that web site. If you do not have the password, please call the hospital operator.  07/04/2020, 3:13 PM

## 2020-07-04 NOTE — Consult Note (Signed)
Reason for Consult:epistaxis Referring Physician: hospitalist  [name] is an 59 y.o. male.  HPI: hx of gout attaack and had a upper extremity DVT, he was placed on heparin and started having right sided epistaxis. He started this am. No prior hx and it has been bleeding very minimally. He only has to drab the nose once or twice hour. He had no hx of nasal obstruction. Currently there is no bleeding  Past Medical History:  Diagnosis Date  . Gouty bursitis, elbow  10/18/2015  . Hx of gout   . Hyperlipidemia   . Hypertension   . Knee pain, chronic   . Type II diabetes mellitus ([place])     Past Surgical History:  Procedure Laterality Date  . KNEE ARTHROSCOPY Bilateral 12/29/2015   Procedure: ARTHROSCOPY KNEE BILATERAL WITH  PARTIAL MEDIAL AND LATERAL MENISCECTOMY AND FOUR COMPARTMENT SYNOVECTOMY , CHONDRALPLASTIES, PATELLA-FEMORAL CHONDRALPLASTIES BILATERALLY;  Surgeon: Dorna Leitz, MD;  Location: [facility];  Service: Orthopedics;  Laterality: Bilateral;  . NO PAST SURGERIES      Family History  Problem Relation Age of Onset  . Diabetes Mother   . Diabetes Father     Social History:  reports that he has never smoked. He has never used smokeless tobacco. He reports that he does not drink alcohol and does not use drugs.  Allergies: No Known Allergies  Medications: I have reviewed the patient's current medications.  Results for orders placed or performed during the hospital encounter of 06/28/20 (from the past 48 hour(s))  Glucose, capillary     Status: Abnormal   Collection Time: 07/02/20  4:46 PM  Result Value Ref Range   Glucose-Capillary 245 (H) 70 - 99 mg/dL    Comment: Glucose reference range applies only to samples taken after fasting for at least 8 hours.  Glucose, capillary     Status: Abnormal   Collection Time: 07/02/20  9:47 PM  Result Value Ref Range   Glucose-Capillary 228 (H) 70 - 99 mg/dL    Comment: Glucose reference range applies only to samples taken after  fasting for at least 8 hours.  CBC     Status: Abnormal   Collection Time: 07/03/20  1:22 AM  Result Value Ref Range   WBC 10.0 4.0 - 10.5 K/uL   RBC 4.26 4.22 - 5.81 MIL/uL   Hemoglobin 10.2 (L) 13.0 - 17.0 g/dL   HCT 32.3 (L) 39 - 52 %   MCV 75.8 (L) 80.0 - 100.0 fL   MCH 23.9 (L) 26.0 - 34.0 pg   MCHC 31.6 30.0 - 36.0 g/dL   RDW 17.2 (H) 11.5 - 15.5 %   Platelets 278 150 - 400 K/uL   nRBC 0.3 (H) 0.0 - 0.2 %    Comment: Performed at [facility] [address] [place], [place] [address]  Comprehensive metabolic panel     Status: Abnormal   Collection Time: 07/03/20  1:22 AM  Result Value Ref Range   Sodium 143 135 - 145 mmol/L   Potassium 4.2 3.5 - 5.1 mmol/L   Chloride 103 98 - 111 mmol/L   CO2 27 22 - 32 mmol/L   Glucose, Bld 209 (H) 70 - 99 mg/dL    Comment: Glucose reference range applies only to samples taken after fasting for at least 8 hours.   BUN 50 (H) 6 - 20 mg/dL   Creatinine, Ser 1.46 (H) 0.61 - 1.24 mg/dL   Calcium 9.4 8.9 - 10.3 mg/dL   Total  Protein 6.9 6.5 - 8.1 g/dL   Albumin 2.5 (L) 3.5 - 5.0 g/dL   AST 45 (H) 15 - 41 U/L   ALT 45 (H) 0 - 44 U/L   Alkaline Phosphatase 60 38 - 126 U/L   Total Bilirubin 1.0 0.3 - 1.2 mg/dL   GFR calc non Af Amer 52 (L) >60 mL/min   GFR calc Af Amer 60 (L) >60 mL/min   Anion gap 13 5 - 15    Comment: Performed at [facility] [address], [place], [place] [address]  CK     Status: None   Collection Time: 07/03/20  1:22 AM  Result Value Ref Range   Total CK 356 49.0 - 397.0 U/L    Comment: Performed at [facility], [place] [address], [place], [place] [address]  Glucose, capillary     Status: Abnormal   Collection Time: 07/03/20  6:57 AM  Result Value Ref Range   Glucose-Capillary 136 (H) 70 - 99 mg/dL    Comment: Glucose reference range applies only to samples taken after fasting for at least 8 hours.  Glucose, capillary     Status: Abnormal   Collection Time: 07/03/20 11:26 AM  Result Value Ref  Range   Glucose-Capillary 117 (H) 70 - 99 mg/dL    Comment: Glucose reference range applies only to samples taken after fasting for at least 8 hours.   Comment 1 Notify RN   Glucose, capillary     Status: Abnormal   Collection Time: 07/03/20  4:47 PM  Result Value Ref Range   Glucose-Capillary 265 (H) 70 - 99 mg/dL    Comment: Glucose reference range applies only to samples taken after fasting for at least 8 hours.   Comment 1 Notify RN   Glucose, capillary     Status: Abnormal   Collection Time: 07/03/20  9:23 PM  Result Value Ref Range   Glucose-Capillary 261 (H) 70 - 99 mg/dL    Comment: Glucose reference range applies only to samples taken after fasting for at least 8 hours.  Heparin level (unfractionated)     Status: Abnormal   Collection Time: 07/04/20 12:41 AM  Result Value Ref Range   Heparin Unfractionated 1.36 (H) 0.30 - 0.70 IU/mL    Comment: RESULTS CONFIRMED BY MANUAL DILUTION (NOTE) If heparin results are below expected values, and patient dosage has  been confirmed, suggest follow up testing of antithrombin III levels. Performed at [facility], [place] [address] [place], [place] [address]   Glucose, capillary     Status: Abnormal   Collection Time: 07/04/20  6:07 AM  Result Value Ref Range   Glucose-Capillary 169 (H) 70 - 99 mg/dL    Comment: Glucose reference range applies only to samples taken after fasting for at least 8 hours.  Glucose, capillary     Status: Abnormal   Collection Time: 07/04/20  8:13 AM  Result Value Ref Range   Glucose-Capillary 153 (H) 70 - 99 mg/dL    Comment: Glucose reference range applies only to samples taken after fasting for at least 8 hours.   Comment 1 Notify RN    Comment 2 Document in Chart   CBC     Status: Abnormal   Collection Time: 07/04/20  9:26 AM  Result Value Ref Range   WBC 14.4 (H) 4.0 - 10.5 K/uL   RBC 4.49 4.22 - 5.81 MIL/uL   Hemoglobin 10.9 (L) 13.0 - 17.0 g/dL  HCT 34.4 (L) 39 - 52 %   MCV 76.6 (L) 80.0  - 100.0 fL   MCH 24.3 (L) 26.0 - 34.0 pg   MCHC 31.7 30.0 - 36.0 g/dL   RDW 17.7 (H) 11.5 - 15.5 %   Platelets 322 150 - 400 K/uL   nRBC 0.5 (H) 0.0 - 0.2 %    Comment: Performed at [facility] [address], [place], [place] [address]  Basic metabolic panel     Status: Abnormal   Collection Time: 07/04/20  9:26 AM  Result Value Ref Range   Sodium 140 135 - 145 mmol/L   Potassium 4.0 3.5 - 5.1 mmol/L   Chloride 105 98 - 111 mmol/L   CO2 25 22 - 32 mmol/L   Glucose, Bld 142 (H) 70 - 99 mg/dL    Comment: Glucose reference range applies only to samples taken after fasting for at least 8 hours.   BUN 38 (H) 6 - 20 mg/dL   Creatinine, Ser 1.29 (H) 0.61 - 1.24 mg/dL   Calcium 9.3 8.9 - 10.3 mg/dL   GFR calc non Af Amer 60 (L) >60 mL/min   GFR calc Af Amer >60 >60 mL/min   Anion gap 10 5 - 15    Comment: Performed at [facility] [address], [place], [place] [address]  Glucose, capillary     Status: Abnormal   Collection Time: 07/04/20 11:30 AM  Result Value Ref Range   Glucose-Capillary 128 (H) 70 - 99 mg/dL    Comment: Glucose reference range applies only to samples taken after fasting for at least 8 hours.    VAS [place] UPPER EXTREMITY VENOUS DUPLEX  Result Date: 07/04/2020 UPPER VENOUS STUDY  Indications: Swelling Comparison Study: No prior studies. Performing Technologist: Darlin Coco  Examination Guidelines: A complete evaluation includes B-mode imaging, spectral Doppler, color Doppler, and power Doppler as needed of all accessible portions of each vessel. Bilateral testing is considered an integral part of a complete examination. Limited examinations for reoccurring indications may be performed as noted.  Right Findings: +----------+------------+---------+-----------+----------+-------+ RIGHT     CompressiblePhasicitySpontaneousPropertiesSummary +----------+------------+---------+-----------+----------+-------+ Subclavian               Yes       Yes                       +----------+------------+---------+-----------+----------+-------+  Left Findings: +----------+------------+---------+-----------+----------+-----------------+ LEFT      CompressiblePhasicitySpontaneousProperties     Summary      +----------+------------+---------+-----------+----------+-----------------+ IJV           Full       Yes       Yes                                +----------+------------+---------+-----------+----------+-----------------+ Subclavian               Yes       Yes                                +----------+------------+---------+-----------+----------+-----------------+ Axillary      Full       Yes       Yes                                +----------+------------+---------+-----------+----------+-----------------+  Brachial      None       No        No               Age Indeterminate +----------+------------+---------+-----------+----------+-----------------+ Radial        Full                                                    +----------+------------+---------+-----------+----------+-----------------+ Ulnar         Full                                                    +----------+------------+---------+-----------+----------+-----------------+ Cephalic      Full                                                    +----------+------------+---------+-----------+----------+-----------------+ Basilic       Full                                                    +----------+------------+---------+-----------+----------+-----------------+  Summary:  Right: No evidence of thrombosis in the subclavian.  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  *See table(s) above for measurements and observations.  Diagnosing physician: Deitra Mayo MD Electronically signed by Deitra Mayo MD on 07/04/2020 at 7:59:24 AM.    Final     Review of Systems Blood  pressure 100/64, pulse 82, temperature 98.5 F (36.9 C), temperature source Oral, resp. rate 14, height 5\' 5"  (1.651 m), weight 114.2 kg, SpO2 100 %. Physical Exam Constitutional:      Appearance: Normal appearance.  HENT:     Head: Normocephalic and atraumatic.     Nose:     Comments: Clot in the right side suctioned out. No lesion or mass and no bleeding    Mouth/Throat:     Mouth: Mucous membranes are moist.  Eyes:     Extraocular Movements: Extraocular movements intact.     Pupils: Pupils are equal, round, and reactive to light.  Musculoskeletal:     Cervical back: Normal range of motion.  Neurological:     Mental Status: He is alert.     Assessment/Plan: Epistaxis- I suctioned the clot out of the right side and he currently has no bleeding. He should try Afrin 2 sprays BID for 2-3 days and saline BID. No intervention needed at this time and this should resolve without packing. Call if further bleeding.   Melissa Montane 07/04/2020, 12:24 PM

## 2020-07-04 NOTE — Progress Notes (Signed)
Spoke with Konrad Felix MD. Will hold Heparin until pt nose stops bleeding and advised to speak with day shift provider for further plan if needed.   Lacretia Leigh RN

## 2020-07-04 NOTE — Progress Notes (Signed)
Contacted provider G. Shalhoub. Pt is on a heparin drip at 70ml/hr. Pt is having a nose bleed. Bleed was stopped by RN. Will await response from provider.  Lacretia Leigh RN

## 2020-07-04 NOTE — NC FL2 (Signed)
[place] LEVEL OF CARE SCREENING TOOL     IDENTIFICATION  Patient Name: [name] Birthdate: [birth year] Sex: male Admission Date (Current Location): 06/28/2020  [facility] and [place] Number:  Herbalist and Address:  [facility], [place] [address] [place], [place] [address]      Provider Number: [id number]  Attending Physician Name and Address:  Hosie Poisson, MD  Relative Name and Phone Number:       Current Level of Care: Hospital Recommended Level of Care: [place] Prior Approval Number:    Date Approved/Denied:   PASRR Number: [id number]  Discharge Plan: SNF    Current Diagnoses: Patient Active Problem List   Diagnosis Date Noted  . Non-traumatic rhabdomyolysis   . Arthralgia   . Left elbow pain   . Left hand pain   . AKI (acute kidney injury) ([place]) 06/28/2020  . Pain due to onychomycosis of toenails of both feet 12/17/2019  . Class 3 severe obesity due to excess calories with serious comorbidity and body mass index (BMI) of 45.0 to 49.9 in adult ([place]) 09/23/2018  . Diabetic neuropathy ([place]) 09/08/2017  . Insomnia 12/26/2016  . Scrotal abscess 10/09/2016  . Acute meniscal tear of right knee 12/29/2015  . Acute meniscal tear of left knee 12/29/2015  . Other specified crystal arthropathies, left knee 12/29/2015  . Other specified crystal arthropathies, right knee 12/29/2015  . Arthritis of knee   . Inability to ambulate due to knee   . Microcytic anemia   . Bilateral knee pain 12/23/2015  . Hypertension 12/23/2015  . Chronic mid back pain 12/23/2015  . Gout 12/23/2015  . Osteoarthritis of both knees 12/23/2015  . Intractable pain 12/23/2015  . Normocytic anemia 12/23/2015  . Gouty bursitis, elbow  10/18/2015  . IDDM (insulin dependent diabetes mellitus) 10/18/2015    Orientation RESPIRATION BLADDER Height & Weight     Self, Time, Situation, Place  Normal Continent, Indwelling catheter  Weight: 251 lb 12.3 oz (114.2 kg) Height:  5\' 5"  (165.1 cm)  BEHAVIORAL SYMPTOMS/MOOD NEUROLOGICAL BOWEL NUTRITION STATUS      Continent Diet (see discharge summary)  AMBULATORY STATUS COMMUNICATION OF NEEDS Skin   Extensive Assist   Other (Comment) (cracking on buttocks/leg)                       Personal Care Assistance Level of Assistance  Bathing, Feeding, Dressing Bathing Assistance: Maximum assistance Feeding assistance: Independent Dressing Assistance: Maximum assistance     Functional Limitations Info  Sight, Hearing, Speech Sight Info: Adequate Hearing Info: Adequate Speech Info: Adequate    SPECIAL CARE FACTORS FREQUENCY  PT (By licensed PT), OT (By licensed OT)     PT Frequency: 5x week OT Frequency: 5x week            Contractures Contractures Info: Not present    Additional Factors Info  Code Status, Allergies, Psychotropic, Insulin Sliding Scale Code Status Info: Full Code Allergies Info: No Known Allergies Psychotropic Info: traZODone (DESYREL) tablet 50 mg daily at bedtime Insulin Sliding Scale Info: insulin aspart (novoLOG) injection 0-20 Units 3x daily with meals; insulin aspart (novoLOG) injection 0-5 Units daily at bedtimes; insulin glargine (LANTUS) injection 22 Units 2x daily       Current Medications (07/04/2020):  This is the current hospital active medication list Current Facility-Administered Medications  Medication Dose Route Frequency Provider Last Rate Last Admin  . acetaminophen (TYLENOL) tablet 650 mg  650 mg Oral Q6H PRN Wynetta Fines T, MD   650 mg at 07/02/20 0005   Or  . acetaminophen (TYLENOL) suppository 650 mg  650 mg Rectal Q6H PRN Wynetta Fines T, MD      . aspirin EC tablet 81 mg  81 mg Oral Daily Wynetta Fines T, MD   81 mg at 07/04/20 1040  . Chlorhexidine Gluconate Cloth 2 % PADS 6 each  6 each Topical Daily Opyd, Ilene Qua, MD   6 each at 07/04/20 1030  . ferrous sulfate tablet 325 mg  325 mg Oral Q breakfast Wynetta Fines T,  MD   325 mg at 07/04/20 1040  . heparin ADULT infusion 100 units/mL (25000 units/265mL sodium chloride 0.45%)  1,100 Units/hr Intravenous Continuous Hosie Poisson, MD 11 mL/hr at 07/04/20 1641 1,100 Units/hr at 07/04/20 1641  . hydrALAZINE (APRESOLINE) injection 5 mg  5 mg Intravenous Q4H PRN Wynetta Fines T, MD      . HYDROmorphone (DILAUDID) injection 0.5-1 mg  0.5-1 mg Intravenous Q2H PRN Wynetta Fines T, MD      . insulin aspart (novoLOG) injection 0-20 Units  0-20 Units Subcutaneous TID WC Hosie Poisson, MD   11 Units at 07/04/20 1631  . insulin aspart (novoLOG) injection 0-5 Units  0-5 Units Subcutaneous QHS Hosie Poisson, MD   3 Units at 07/03/20 2242  . insulin glargine (LANTUS) injection 22 Units  22 Units Subcutaneous BID Hosie Poisson, MD   22 Units at 07/04/20 1041  . loratadine (CLARITIN) tablet 10 mg  10 mg Oral Daily Wynetta Fines T, MD   10 mg at 07/04/20 1040  . pantoprazole (PROTONIX) EC tablet 40 mg  40 mg Oral QHS Hosie Poisson, MD   40 mg at 07/03/20 2241  . predniSONE (DELTASONE) tablet 60 mg  60 mg Oral QAC breakfast Hosie Poisson, MD   60 mg at 07/04/20 1040  . traZODone (DESYREL) tablet 50 mg  50 mg Oral QHS Lequita Halt, MD   50 mg at 07/03/20 2241     Discharge Medications: Please see discharge summary for a list of discharge medications.  Relevant Imaging Results:  Relevant Lab Results:   Additional Information SS#[id number]  [place]

## 2020-07-05 LAB — BASIC METABOLIC PANEL
Anion gap: 9 (ref 5–15)
BUN: 37 mg/dL — ABNORMAL HIGH (ref 6–20)
CO2: 25 mmol/L (ref 22–32)
Calcium: 9 mg/dL (ref 8.9–10.3)
Chloride: 104 mmol/L (ref 98–111)
Creatinine, Ser: 1.29 mg/dL — ABNORMAL HIGH (ref 0.61–1.24)
GFR calc Af Amer: >60 mL/min (ref >60)
GFR calc non Af Amer: 60 mL/min — ABNORMAL LOW (ref >60)
Glucose, Bld: 157 mg/dL — ABNORMAL HIGH (ref 70–99)
Potassium: 4.4 mmol/L (ref 3.5–5.1)
Sodium: 138 mmol/L (ref 135–145)

## 2020-07-05 LAB — CBC
HCT: 31.1 % — ABNORMAL LOW (ref 39.0–52.0)
Hemoglobin: 10.1 g/dL — ABNORMAL LOW (ref 13.0–17.0)
MCH: 24.9 pg — ABNORMAL LOW (ref 26.0–34.0)
MCHC: 32.5 g/dL (ref 30.0–36.0)
MCV: 76.6 fL — ABNORMAL LOW (ref 80.0–100.0)
Platelets: 320 10*3/uL (ref 150–400)
RBC: 4.06 MIL/uL — ABNORMAL LOW (ref 4.22–5.81)
RDW: 17.7 % — ABNORMAL HIGH (ref 11.5–15.5)
WBC: 16.5 10*3/uL — ABNORMAL HIGH (ref 4.0–10.5)
nRBC: 0.4 % — ABNORMAL HIGH (ref 0.0–0.2)

## 2020-07-05 LAB — HEPARIN LEVEL (UNFRACTIONATED): Heparin Unfractionated: 0.71 IU/mL — ABNORMAL HIGH (ref 0.30–0.70)

## 2020-07-05 LAB — GLUCOSE, CAPILLARY
Glucose-Capillary: 166 mg/dL — ABNORMAL HIGH (ref 70–99)
Glucose-Capillary: 124 mg/dL — ABNORMAL HIGH (ref 70–99)
Glucose-Capillary: 266 mg/dL — ABNORMAL HIGH (ref 70–99)
Glucose-Capillary: 321 mg/dL — ABNORMAL HIGH (ref 70–99)

## 2020-07-05 MED ORDER — APIXABAN 5 MG PO TABS
10.0000 mg | ORAL_TABLET | Freq: Two times a day (BID) | ORAL | Status: DC
  Administered 2020-07-05 – 2020-07-11 (×13): 10 mg via ORAL
  Filled 2020-07-05 (×13): qty 2

## 2020-07-05 MED ORDER — APIXABAN 5 MG PO TABS: 5.0000 mg | ORAL_TABLET | Freq: Two times a day (BID) | ORAL | Status: DC

## 2020-07-05 NOTE — TOC Progression Note (Signed)
Transition of Care [facility]) - Progression Note    Patient Details  Name: [name] MRN: [id number] Date of Birth: 07-09-1961  Transition [facility]) CM/SW [place], [place] Phone Number: 07/05/2020, 4:09 PM  Clinical Narrative:     Patient has accepted bed offer with [facility] [facility] called [facility] and confirmed bed offer- SNF will statrt insurance authorization CSW sent faxed copy of covid vaccination cart to SNF- copy placed in chart. CSW udpated MD- covid test is needed.  Thurmond Butts, MSW, LCSWA Clinical Social Worker     Expected Discharge Plan: Home w Home Health Services Barriers to Discharge: Ship broker, Continued Medical Work up  Expected Discharge Plan and Services Expected Discharge Plan: [place] In-house Referral: Clinical Social Work                                             Social Determinants of Health (SDOH) Interventions    Readmission Risk Interventions No flowsheet data found.

## 2020-07-05 NOTE — Progress Notes (Signed)
PROGRESS NOTE    [name]  EHU:[id number] DOB: 1961/05/01 DOA: 06/28/2020 PCP: Charlott Rakes, MD    Chief Complaint  Patient presents with  . Hand Pain  . Weakness    Brief Narrative:  59 year old gentleman with prior history of chronic gouty arthritis of multiple joint, stage II CKD, insulin-dependent diabetes mellitus, hypertension, hyperlipidemia, anemia of chronic disease, iron deficiency anemia, presents to ED for lethargy and multijoint pain. As per the sister-in-law at bedside patient was unable to stand or walk and was brought to ED.  He was found to be in AKI, hyperkalemic and in rhabdomyolysis. He was started on aggressive hydration and his creatinine has improved from 6.38 to 2.69. orthopedics consulted for his left elbow swelling and elbow joint effusion. Currently pt is on steroids for the gouty flare up of the multiple joints. He was also found to have guiac positive stools. GI consulted, recommended outpatient colonoscopy and EGD .  PT evaluation recommending SNF.   Due to persistent left upper extremity pain,  Ordered venous duplex of the left upper extremity to evaluate for DVT, it was positive for DVT, he was started on IV heparin but it was held for right sided nostril bleeding. ENT consulted, and waas found to have a clot in the right nostril, which was suctioned out.  No need for packing and bleeding resolved. Pt was restarted on heparin and later on transitioned to eliquis.  Recommend atleast another 7 days of prednisone for gout treatment on discharge. Possible discharge in am if elbow pain improves.    Assessment & Plan:   Active Problems:   AKI (acute kidney injury) ([place])   Arthralgia   Left elbow pain   Left hand pain   Non-traumatic rhabdomyolysis   AKI on stage II CKD Probably secondary to volume depletion/renal hypoperfusion. Started the patient on IV fluid resuscitation and renal parameters have improved. He was started on aggressive hydration  and his creatinine has improved from 6.38 to 2.69 TO 1.95 to 1.4. to 1.2.  Nephrology consulted and have signed off.     Ultrasound renal did not show any hydronephrosis at this time.   Bicarb has improved.   Acute hyperkalemia No EKG changes.  Improved with hydration.repeat potassium wnl.    Toxic encephalopathy probably uremic in nature slowly improving with improvement in renal parameters. Resolved.    Acute gouty flareup/ multiple joint pain, / left elbow effusion/ left elbow gout flare up.  Patient was started on IV Solu-Medrol, transitioned to oral prednisone , recommend 7 to 10 day course of prednisone. Since patient is having worsening pain int he left elbow , increased his prednisone to 60 mg daily. Recommend another 7 days of prednisone for gouty flare up.   Ordered venous duplex to evaluate for DVT, was found to have DVT in the left brachial veins .  Orthopedics consulted and recommendations given.   Continue to hold allopurinol, colchicine and hydrochlorothiazide at this time.    Insulin-dependent diabetes mellitus, uncontrolled with hyperglycemia probably secondary to IV steroids CBG (last 3)  Recent Labs    07/04/20 2215 07/05/20 0633 07/05/20 1118  GLUCAP 258* 166* 124*     A1c is 7.1, increased Lantus to 22 units twice daily,  cbgs are better  controlled.   Essential hypertension Well controlled.    Iron deficiency anemia, anemia of chronic disease Baseline hemoglobin around 11 in 2020 November.  Gradual decline to 9.7 this admission. Stool for occult blood is positive. Transfuse to keep  hemoglobin greater than seven.  Continue to monitor. GI consulted to see if he needs a colonoscopy or EGD, recommended outpatient follow upw ith GI.  Hemoglobin stable around 10.   Elevated lactic acid Probably secondary to dehydration, normalized with IV fluids.   Nontraumatic rhabdomyolysis with AKI, anion gap metabolic acidosis Improving with IV fluids. CK levels  are improving. CK level is 947 metabolic acidosis improving, bicarb level is 26.  Fever Probably secondary to acute gouty flareup of multiple joints Blood cultures have been negative so far. Urine analysis is negative for infection. Chest x-ray does not show an acute cardiopulmonary abnormalities Resolved.    Mildly elevated liver enzymes probably secondary to rhabdomyolysis.   Left brachial vein DVT of indeterminate age:  Started him on IV heparin, but yesterday morning,  started having nose bleeds. IV heparin was held and ENT consulted for recommendations.  He was found t have a clot which was suctioned out and IV heparin was restarted and later on transitioned to eliquis for discharge.     DVT prophylaxis: Eliquis.  Code Status: full code.  Family Communication: none at bedside.  Disposition:   Status is: Inpatient  Remains inpatient appropriate because:Ongoing diagnostic testing needed not appropriate for outpatient work up and Inpatient level of care appropriate due to severity of illness, worsening pain in the elbow.    Dispo: The patient is from: Home              Anticipated d/c is to: SNF.              Anticipated d/c date is: 1 day              Patient currently is not medically stable to d/c.       Consultants:   Orthopedics  Nephrology  Gastroenterology.    Procedures: none.   Antimicrobials: none.   Subjective: ELBOW pain is much better today and he is able to lift his arm and move his elbow.   Objective: Vitals:   07/05/20 0010 07/05/20 0454 07/05/20 0741 07/05/20 1113  BP: 113/90 96/86 113/89 112/84  Pulse: 79 83 87 86  Resp: 15 16 15 14   Temp: 97.6 F (36.4 C) 98.1 F (36.7 C) 98.1 F (36.7 C) (!) 97.5 F (36.4 C)  TempSrc: Oral Oral Oral Oral  SpO2: 96% 97% 100% 99%  Weight:      Height:        Intake/Output Summary (Last 24 hours) at 07/05/2020 1402 Last data filed at 07/05/2020 0800 Gross per 24 hour  Intake 701 ml  Output  1600 ml  Net -899 ml   Filed Weights   06/28/20 1623 06/28/20 2116  Weight: 116.6 kg 114.2 kg    Examination:  General exam: alert and comfortable. Not in distress.  Respiratory system: clear to auscultation, no wheezing or rhonchi.  Cardiovascular system: S1S2 heard. RRR no JVD,  Gastrointestinal system: abd is soft, non tender non distended. Bowel sounds wnl.  Central nervous system: alert and oriented. Extremities: left upper extremity swelling and tenderness improving.  Skin: no rashes seen.  Psychiatry: Mood is appropriate  Data Reviewed: I have personally reviewed following labs and imaging studies  CBC: Recent Labs  Lab 06/28/20 1653 06/28/20 1854 07/01/20 0324 07/02/20 0818 07/03/20 0122 07/04/20 0926 07/05/20 0557  WBC 14.2*   < > 10.4 8.8 10.0 14.4* 16.5*  NEUTROABS 11.9*  --   --   --   --   --   --  HGB 10.1*   < > 10.2* 10.6* 10.2* 10.9* 10.1*  HCT 31.3*   < > 30.8* 34.0* 32.3* 34.4* 31.1*  MCV 75.2*   < > 74.4* 76.4* 75.8* 76.6* 76.6*  PLT 183   < > 180 276 278 322 320   < > = values in this interval not displayed.    Basic Metabolic Panel: Recent Labs  Lab 07/01/20 0324 07/02/20 0818 07/03/20 0122 07/04/20 0926 07/05/20 0557  NA 141 144 143 140 138  K 3.9 3.6 4.2 4.0 4.4  CL 102 106 103 105 104  CO2 26 27 27 25 25   GLUCOSE 334* 156* 209* 142* 157*  BUN 93* 66* 50* 38* 37*  CREATININE 1.95* 1.49* 1.46* 1.29* 1.29*  CALCIUM 9.6 9.6 9.4 9.3 9.0    GFR: Estimated Creatinine Clearance: 71.1 mL/min (A) (by C-G formula based on SCr of 1.29 mg/dL (H)).  Liver Function Tests: Recent Labs  Lab 06/30/20 0223 07/01/20 0324 07/02/20 0818 07/03/20 0122  AST 151* 110* 69* 45*  ALT 66* 66* 56* 45*  ALKPHOS 69 61 55 60  BILITOT 0.8 0.6 0.9 1.0  PROT 7.1 7.1 7.2 6.9  ALBUMIN 2.5* 2.5* 2.7* 2.5*    CBG: Recent Labs  Lab 07/04/20 1130 07/04/20 1628 07/04/20 2215 07/05/20 0633 07/05/20 1118  GLUCAP 128* 278* 258* 166* 124*     Recent  Results (from the past 240 hour(s))  SARS Coronavirus 2 by RT PCR (hospital order, performed in [facility] hospital lab) Nasopharyngeal Nasopharyngeal Swab     Status: None   Collection Time: 06/28/20  6:43 PM   Specimen: Nasopharyngeal Swab  Result Value Ref Range Status   SARS Coronavirus 2 NEGATIVE NEGATIVE Final    Comment: (NOTE) SARS-CoV-2 target nucleic acids are NOT DETECTED.  The SARS-CoV-2 RNA is generally detectable in upper and lower respiratory specimens during the acute phase of infection. The lowest concentration of SARS-CoV-2 viral copies this assay can detect is 250 copies / mL. A negative result does not preclude SARS-CoV-2 infection and should not be used as the sole basis for treatment or other patient management decisions.  A negative result may occur with improper specimen collection / handling, submission of specimen other than nasopharyngeal swab, presence of viral mutation(s) within the areas targeted by this assay, and inadequate number of viral copies (<250 copies / mL). A negative result must be combined with clinical observations, patient history, and epidemiological information.  Fact Sheet for Patients:   StrictlyIdeas.no  Fact Sheet for Healthcare Providers: BankingDealers.co.za  This test is not yet approved or  cleared by the [place] FDA and has been authorized for detection and/or diagnosis of SARS-CoV-2 by FDA under an Emergency Use Authorization (EUA).  This EUA will remain in effect (meaning this test can be used) for the duration of the COVID-19 declaration under Section 564(b)(1) of the Act, 21 U.S.C. section 360bbb-3(b)(1), unless the authorization is terminated or revoked sooner.  Performed at [facility], [place] [address], [place], [place] [address]   Culture, Urine     Status: None   Collection Time: 06/28/20 10:00 PM   Specimen: Urine, Catheterized  Result Value Ref Range  Status   Specimen Description URINE, CATHETERIZED  Final   Special Requests NONE  Final   Culture   Final    NO GROWTH Performed at [place] [address] [place], [place] [address]    Report Status 06/29/2020 FINAL  Final  Culture, blood (routine  x 2)     Status: None   Collection Time: 06/28/20 11:51 PM   Specimen: BLOOD RIGHT HAND  Result Value Ref Range Status   Specimen Description BLOOD RIGHT HAND  Final   Special Requests   Final    BOTTLES DRAWN AEROBIC AND ANAEROBIC Blood Culture adequate volume   Culture   Final    NO GROWTH 5 DAYS Performed at [facility], [address], [place], [place] [address]    Report Status 07/04/2020 FINAL  Final  Culture, blood (routine x 2)     Status: None   Collection Time: 06/29/20  3:29 AM   Specimen: BLOOD RIGHT HAND  Result Value Ref Range Status   Specimen Description BLOOD RIGHT HAND  Final   Special Requests   Final    BOTTLES DRAWN AEROBIC AND ANAEROBIC Blood Culture adequate volume   Culture   Final    NO GROWTH 5 DAYS Performed at [facility], [place] [address] [place], [place] [address]    Report Status 07/04/2020 FINAL  Final         Radiology Studies: VAS [place] UPPER EXTREMITY VENOUS DUPLEX  Result Date: 07/04/2020 UPPER VENOUS STUDY  Indications: Swelling Comparison Study: No prior studies. Performing Technologist: Darlin Coco  Examination Guidelines: A complete evaluation includes B-mode imaging, spectral Doppler, color Doppler, and power Doppler as needed of all accessible portions of each vessel. Bilateral testing is considered an integral part of a complete examination. Limited examinations for reoccurring indications may be performed as noted.  Right Findings: +----------+------------+---------+-----------+----------+-------+ RIGHT     CompressiblePhasicitySpontaneousPropertiesSummary +----------+------------+---------+-----------+----------+-------+ Subclavian               Yes        Yes                      +----------+------------+---------+-----------+----------+-------+  Left Findings: +----------+------------+---------+-----------+----------+-----------------+ LEFT      CompressiblePhasicitySpontaneousProperties     Summary      +----------+------------+---------+-----------+----------+-----------------+ IJV           Full       Yes       Yes                                +----------+------------+---------+-----------+----------+-----------------+ Subclavian               Yes       Yes                                +----------+------------+---------+-----------+----------+-----------------+ Axillary      Full       Yes       Yes                                +----------+------------+---------+-----------+----------+-----------------+ Brachial      None       No        No               Age Indeterminate +----------+------------+---------+-----------+----------+-----------------+ Radial        Full                                                    +----------+------------+---------+-----------+----------+-----------------+  Ulnar         Full                                                    +----------+------------+---------+-----------+----------+-----------------+ Cephalic      Full                                                    +----------+------------+---------+-----------+----------+-----------------+ Basilic       Full                                                    +----------+------------+---------+-----------+----------+-----------------+  Summary:  Right: No evidence of thrombosis in the subclavian.  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  *See table(s) above for measurements and observations.  Diagnosing physician: Deitra Mayo MD Electronically signed by Deitra Mayo MD on 07/04/2020 at 7:59:24 AM.    Final          Scheduled Meds: . apixaban  10 mg Oral BID   Followed by  . [START ON 07/12/2020] apixaban  5 mg Oral BID  . aspirin EC  81 mg Oral Daily  . Chlorhexidine Gluconate Cloth  6 each Topical Daily  . ferrous sulfate  325 mg Oral Q breakfast  . insulin aspart  0-20 Units Subcutaneous TID WC  . insulin aspart  0-5 Units Subcutaneous QHS  . insulin glargine  22 Units Subcutaneous BID  . loratadine  10 mg Oral Daily  . pantoprazole  40 mg Oral QHS  . traZODone  50 mg Oral QHS   Continuous Infusions:    LOS: 7 days        Hosie Poisson, MD [facility]   To contact the attending provider between 7A-7P or the covering provider during after hours 7P-7A, please log into the web site [email or web address] and access using universal [place] password for that web site. If you do not have the password, please call the hospital operator.  07/05/2020, 2:02 PM

## 2020-07-05 NOTE — Discharge Instructions (Signed)
Information on my medicine - ELIQUIS (apixaban)  This medication education was reviewed with me or my healthcare representative as part of my discharge preparation.    Why was Eliquis prescribed for you? Eliquis was prescribed to treat blood clots that may have been found in the veins of your legs (deep vein thrombosis) or in your lungs (pulmonary embolism) and to reduce the risk of them occurring again.  What do You need to know about Eliquis ? The starting dose is 10 mg (two 5 mg tablets) taken TWICE daily for the FIRST SEVEN (7) DAYS, then on 07/12/2020  the dose is reduced to ONE 5 mg tablet taken TWICE daily.  Eliquis may be taken with or without food.   Try to take the dose about the same time in the morning and in the evening. If you have difficulty swallowing the tablet whole please discuss with your pharmacist how to take the medication safely.  Take Eliquis exactly as prescribed and DO NOT stop taking Eliquis without talking to the doctor who prescribed the medication.  Stopping may increase your risk of developing a new blood clot.  Refill your prescription before you run out.  After discharge, you should have regular check-up appointments with your healthcare provider that is prescribing your Eliquis.    What do you do if you miss a dose? If a dose of ELIQUIS is not taken at the scheduled time, take it as soon as possible on the same day and twice-daily administration should be resumed. The dose should not be doubled to make up for a missed dose.  Important Safety Information A possible side effect of Eliquis is bleeding. You should call your healthcare provider right away if you experience any of the following: ? Bleeding from an injury or your nose that does not stop. ? Unusual colored urine (red or dark brown) or unusual colored stools (red or black). ? Unusual bruising for unknown reasons. ? A serious fall or if you hit your head (even if there is no  bleeding).  Some medicines may interact with Eliquis and might increase your risk of bleeding or clotting while on Eliquis. To help avoid this, consult your healthcare provider or pharmacist prior to using any new prescription or non-prescription medications, including herbals, vitamins, non-steroidal anti-inflammatory drugs (NSAIDs) and supplements.  This website has more information on Eliquis (apixaban): http://www.eliquis.com/eliquis/home

## 2020-07-05 NOTE — Progress Notes (Addendum)
ANTICOAGULATION CONSULT NOTE  Pharmacy Consult for heparin Indication: LUE DVT  No Known Allergies  Patient Measurements: Height: 5\' 5"  (165.1 cm) Weight: 114.2 kg (251 lb 12.3 oz) IBW/kg (Calculated) : 61.5 Heparin Dosing Weight: 88kg  Vital Signs: Temp: 98.1 F (36.7 C) (08/18 0741) Temp Source: Oral (08/18 0741) BP: 113/89 (08/18 0741) Pulse Rate: 87 (08/18 0741)  Labs: Recent Labs    07/03/20 0122 07/03/20 0122 07/04/20 0041 07/04/20 0926 07/04/20 2134 07/05/20 0557  HGB 10.2*   < >  --  10.9*  --  10.1*  HCT 32.3*  --   --  34.4*  --  31.1*  PLT 278  --   --  322  --  320  HEPARINUNFRC  --   --  1.36*  --  0.38 0.71*  CREATININE 1.46*  --   --  1.29*  --  1.29*  CKTOTAL 356  --   --   --   --   --    < > = values in this interval not displayed.    Estimated Creatinine Clearance: 71.1 mL/min (A) (by C-G formula based on SCr of 1.29 mg/dL (H)).   Medical History: Past Medical History:  Diagnosis Date  . Gouty bursitis, elbow  10/18/2015  . Hx of gout   . Hyperlipidemia   . Hypertension   . Knee pain, chronic   . Type II diabetes mellitus [facility])      Assessment: 59 yo M admitted with AKI and gout flare, now found to have age indeterminate LUE DVT on dopplers 8/16. Patient was initiated on heparin.  Heparin intiially interrupted for nosebleed, which is now resolved.    Heparin level above goal on 1100 units/hr.  Hgb stable 10s.  No further bleeding noted.  Goal of Therapy:  Heparin level 0.3-0.5 units/ml Monitor platelets by anticoagulation protocol: Yes   Plan:  -Reduce heparin to 1000 units/h -Daily heparin level and CBC -Follow up plans for oral anticoagulation  Chirsty Armistead, Pharm.D., BCPS Clinical Pharmacist Clinical phone for 07/05/2020 from 8:30-4:00 is [phone].  **Pharmacist phone directory can be found on [email or web address] listed under [place].  07/05/2020 9:05 AM   Addendum:  New consult to transition to Apixaban.  Will place orders  to D/C heparin and associated labs.  Start Apixaban 10mg  PO BID x 7 days, then 5mg  BID thereafter.  [employer], Pharm.D., BCPS Clinical Pharmacist Clinical phone for 07/05/2020 from 8:30-4:00 is [phone].  **Pharmacist phone directory can be found on [email or web address] listed under [place].  07/05/2020 10:16 AM

## 2020-07-05 NOTE — Progress Notes (Addendum)
Physical Therapy Treatment Patient Details Name: [name] MRN: [id number] DOB: 31-Aug-1961 Today's Date: 07/05/2020    History of Present Illness Pt is 59 y.o. male presenting with change in mentation and worsening of multiple joint pain. Patient admitted with AKI on CKD II likely ATN, acute hyperkalemia, toxic encephalopathy, and acute gouty flare-up. On 07/03/20 around 1530 pt found to have age indeterminate DVT in L UE.  PMHx significant for chronic gouty arthritis of multiple joints, CKD II, IDDM, HTN, HLD, and chronic anemia. 8/17 pt with finding consistent with age indeterminate DVT involving left brachial veins.    PT Comments    Pt remains limited by pain, decreased functional use of LUE, decreased activity tolerance, and weakness. Requiring min-mod assist for bed mobility. Refusing transfer out of bed at this time. Session focused on bed level exercises for ROM/strengthening. Updated d/c plan to SNF; pt agreeable at this time.    Follow Up Recommendations  SNF     Equipment Recommendations  None recommended by PT    Recommendations for Other Services       Precautions / Restrictions Precautions Precautions: Fall Restrictions Weight Bearing Restrictions: No    Mobility  Bed Mobility Overal bed mobility: Needs Assistance Bed Mobility: Supine to Sit;Sit to Supine     Supine to sit: Min assist Sit to supine: Mod assist   General bed mobility comments: MinA for trunk elevation to upright, increased time/effort. ModA for BLE elevation back into bed  Transfers                 General transfer comment: Deferred by patient  Ambulation/Gait                 Stairs             Wheelchair Mobility    Modified Rankin (Stroke Patients Only)       Balance Overall balance assessment: Needs assistance Sitting-balance support: Feet supported Sitting balance-Leahy Scale: Good                                      Cognition  Arousal/Alertness: Awake/alert Behavior During Therapy: WFL for tasks assessed/performed Overall Cognitive Status: Within Functional Limits for tasks assessed                                        Exercises General Exercises - Upper Extremity Digit Composite Flexion: AAROM;Left;10 reps;Seated General Exercises - Lower Extremity Ankle Circles/Pumps: Both;10 reps;Supine Quad Sets: Both;Supine;5 reps Long Arc Quad: Both;10 reps;Seated Heel Slides: Both;5 reps;Supine Hip ABduction/ADduction: Both;5 reps;Supine Other Exercises Other Exercises: Seated: dynamic balance task of eating on edge of bed    General Comments        Pertinent Vitals/Pain Pain Assessment: Faces Faces Pain Scale: Hurts even more Pain Location: L elbow, generalized Pain Descriptors / Indicators: Aching;Grimacing Pain Intervention(s): Limited activity within patient's tolerance;Monitored during session    Home Living                      Prior Function            PT Goals (current goals can now be found in the care plan section) Acute Rehab PT Goals Patient Stated Goal: agreeable to rehab Potential to Achieve Goals: Good    Frequency  Min 2X/week      PT Plan Discharge plan needs to be updated    Co-evaluation              AM-PAC PT "6 Clicks" Mobility   Outcome Measure  Help needed turning from your back to your side while in a flat bed without using bedrails?: A Little Help needed moving from lying on your back to sitting on the side of a flat bed without using bedrails?: A Little Help needed moving to and from a bed to a chair (including a wheelchair)?: A Lot Help needed standing up from a chair using your arms (e.g., wheelchair or bedside chair)?: A Lot Help needed to walk in hospital room?: Total Help needed climbing 3-5 steps with a railing? : Total 6 Click Score: 12    End of Session   Activity Tolerance: Patient limited by pain Patient left: in  bed;with call bell/phone within reach Nurse Communication: Mobility status PT Visit Diagnosis: Unsteadiness on feet (R26.81);Muscle weakness (generalized) (M62.81);Difficulty in walking, not elsewhere classified (R26.2);Pain Pain - Right/Left: Left Pain - part of body: Arm     Time: 5537-4827 PT Time Calculation (min) (ACUTE ONLY): 27 min  Charges:  $Therapeutic Exercise: 8-22 mins $Therapeutic Activity: 8-22 mins                       Wyona Almas, PT, DPT Acute Rehabilitation Services Pager [phone] Office [phone]    Deno Etienne 07/05/2020, 3:39 PM

## 2020-07-06 LAB — SARS CORONAVIRUS 2 BY RT PCR (HOSPITAL ORDER, PERFORMED IN ~~LOC~~ HOSPITAL LAB): SARS Coronavirus 2: NEGATIVE

## 2020-07-06 LAB — GLUCOSE, CAPILLARY
Glucose-Capillary: 158 mg/dL — ABNORMAL HIGH (ref 70–99)
Glucose-Capillary: 99 mg/dL (ref 70–99)
Glucose-Capillary: 230 mg/dL — ABNORMAL HIGH (ref 70–99)
Glucose-Capillary: 212 mg/dL — ABNORMAL HIGH (ref 70–99)

## 2020-07-06 MED ORDER — PREDNISONE 10 MG PO TABS
10.0000 mg | ORAL_TABLET | Freq: Every day | ORAL | Status: DC
  Administered 2020-07-06 – 2020-07-07 (×2): 10 mg via ORAL
  Filled 2020-07-06 (×2): qty 1

## 2020-07-06 MED ORDER — PREDNISONE 20 MG PO TABS: 20.0000 mg | ORAL_TABLET | Freq: Every day | ORAL | Status: DC

## 2020-07-06 MED ORDER — PREDNISONE 20 MG PO TABS: 40.0000 mg | ORAL_TABLET | Freq: Every day | ORAL | Status: DC

## 2020-07-06 MED ORDER — CYCLOBENZAPRINE HCL 10 MG PO TABS
10.0000 mg | ORAL_TABLET | Freq: Three times a day (TID) | ORAL | Status: DC
  Administered 2020-07-06 – 2020-07-11 (×15): 10 mg via ORAL
  Filled 2020-07-06 (×15): qty 1

## 2020-07-06 MED ORDER — COLCHICINE 0.6 MG PO TABS
0.6000 mg | ORAL_TABLET | Freq: Two times a day (BID) | ORAL | Status: DC
  Administered 2020-07-06 – 2020-07-11 (×11): 0.6 mg via ORAL
  Filled 2020-07-06 (×11): qty 1

## 2020-07-06 MED ORDER — METFORMIN HCL 500 MG PO TABS
1000.0000 mg | ORAL_TABLET | Freq: Two times a day (BID) | ORAL | Status: DC
  Administered 2020-07-06 – 2020-07-11 (×10): 1000 mg via ORAL
  Filled 2020-07-06 (×10): qty 2

## 2020-07-06 NOTE — Progress Notes (Signed)
Occupational Therapy Treatment Patient Details Name: [name] MRN: [id number] DOB: [birth year] Today's Date: 07/06/2020    History of present illness Pt is 59 y.o. male presenting with change in mentation and worsening of multiple joint pain. Patient admitted with AKI on CKD II likely ATN, acute hyperkalemia, toxic encephalopathy, and acute gouty flare-up. On 07/03/20 around 1530 pt found to have age indeterminate DVT in L UE.  PMHx significant for chronic gouty arthritis of multiple joints, CKD II, IDDM, HTN, HLD, and chronic anemia. 8/17 pt with finding consistent with age indeterminate DVT involving left brachial veins.   OT comments  Pt progressing towards acute OT goals. Agreeable to transfer EOB to recliner but endorsed anxiety during transfer (HR up to 145 with static standing). +2 min-mod assist utilized to take pivotal steps to access recliner. D/c plan remains appropriate.    Follow Up Recommendations  SNF    Equipment Recommendations  Other (comment) (Defer to next level of care. )    Recommendations for Other Services      Precautions / Restrictions Precautions Precautions: Fall Precaution Comments: Injury to L elbow during HHPT Restrictions Weight Bearing Restrictions: No LUE Weight Bearing: Weight bearing as tolerated       Mobility Bed Mobility               General bed mobility comments: EOB on arrival  Transfers Overall transfer level: Needs assistance Equipment used: 2 person hand held assist Transfers: Sit to/from [facility] Sit to Stand: Mod assist Stand pivot transfers: Mod assist;+2 physical assistance;+2 safety/equipment       General transfer comment: Pt tachycardic up to 145 standing from EOB. Pt endorses anxiety about mobilizing. Seated rest break then SPT utilizing +2 assist.     Balance Overall balance assessment: Needs assistance Sitting-balance support: Feet supported Sitting balance-Leahy Scale: Good      Standing balance support: Bilateral upper extremity supported;During functional activity Standing balance-Leahy Scale: Poor                             ADL either performed or assessed with clinical judgement   ADL Overall ADL's : Needs assistance/impaired                         Toilet Transfer: Moderate assistance;+2 for physical assistance;Stand-pivot;BSC Toilet Transfer Details (indicate cue type and reason): simulated with EOB to recliner.            General ADL Comments: Pt anxious regarding OOB transfer and medical situation in general .     Vision       Perception     Praxis      Cognition Arousal/Alertness: Awake/alert Behavior During Therapy: WFL for tasks assessed/performed Overall Cognitive Status: Within Functional Limits for tasks assessed                                 General Comments: internally distrated at times, benefits from repetition of info        Exercises     Shoulder Instructions       General Comments      Pertinent Vitals/ Pain       Pain Assessment: Faces Faces Pain Scale: Hurts little more Pain Location: L elbow, generalized Pain Descriptors / Indicators: Aching;Grimacing Pain Intervention(s): Monitored during session;Repositioned;Limited activity within patient's tolerance  Home Living  Prior Functioning/Environment              Frequency  Min 2X/week        Progress Toward Goals  OT Goals(current goals can now be found in the care plan section)  Progress towards OT goals: Progressing toward goals  Acute Rehab OT Goals Patient Stated Goal: agreeable to rehab OT Goal Formulation: With patient Time For Goal Achievement: 07/14/20 Potential to Achieve Goals: Good ADL Goals Pt Will Perform Grooming: with modified independence;standing Pt Will Perform Lower Body Bathing: with adaptive equipment;sitting/lateral  leans;sit to/from stand;with supervision Pt Will Perform Lower Body Dressing: with adaptive equipment;sitting/lateral leans;sit to/from stand;with supervision Pt Will Transfer to Toilet: with supervision;ambulating;bedside commode Pt Will Perform Toileting - Clothing Manipulation and hygiene: with modified independence;sitting/lateral leans;sit to/from stand Pt/caregiver will Perform Home Exercise Program: Increased ROM;Left upper extremity;With written HEP provided Additional ADL Goal #1: Patient will demonstrate safety with RW during BADLs.  Plan Discharge plan remains appropriate    Co-evaluation                 AM-PAC OT "6 Clicks" Daily Activity     Outcome Measure   Help from another person eating meals?: A Little Help from another person taking care of personal grooming?: A Little Help from another person toileting, which includes using toliet, bedpan, or urinal?: A Lot Help from another person bathing (including washing, rinsing, drying)?: A Lot Help from another person to put on and taking off regular upper body clothing?: A Little Help from another person to put on and taking off regular lower body clothing?: A Lot 6 Click Score: 15    End of Session Equipment Utilized During Treatment: Gait belt  OT Visit Diagnosis: Unsteadiness on feet (R26.81);Muscle weakness (generalized) (M62.81);Pain Pain - part of body: Leg;Ankle and joints of foot   Activity Tolerance Patient tolerated treatment well;Other (comment) (HR up to 145 with static standing. anxious)   Patient Left in chair;with call bell/phone within reach;with chair alarm set   Nurse Communication          Time: 4562-5638 OT Time Calculation (min): 30 min  Charges: OT General Charges $OT Visit: 1 Visit OT Treatments $Self Care/Home Management : 23-37 mins  [name], OT Acute Rehabilitation Services Pager: [phone] Office: [phone]    [name] 07/06/2020, 2:47 PM

## 2020-07-06 NOTE — TOC Benefit Eligibility Note (Signed)
Transition of Care [facility]) Benefit Eligibility Note    Patient Details  Name: [name] MRN: [id number] Date of Birth: 10/22/1961   Medication/Dose: Arne Cleveland  5 MG BID  Covered?: Yes  Tier: 3 Drug  Prescription Coverage Preferred Pharmacy: [facility]  Spoke with Person/Company/Phone Number:: LOWELL  @  [employer] # [phone]  Co-Pay: Johnsie Kindred  Prior Approval: No  Deductible: Met       Memory Argue Phone Number: 07/06/2020, 10:36 AM

## 2020-07-06 NOTE — Progress Notes (Signed)
Inpatient Diabetes Program Recommendations  AACE/ADA: New Consensus Statement on Inpatient Glycemic Control (2015)  Target Ranges:  Prepandial:   less than 140 mg/dL      Peak postprandial:   less than 180 mg/dL (1-2 hours)      Critically ill patients:  140 - 180 mg/dL   Lab Results  Component Value Date   GLUCAP 158 (H) 07/06/2020   HGBA1C 7.1 (H) 06/28/2020    Review of Glycemic Control Results for OLIS, VIVERETTE (MRN [id number]) as of 07/06/2020 11:27  Ref. Range 07/05/2020 11:18 07/05/2020 15:56 07/05/2020 22:32 07/06/2020 06:17  Glucose-Capillary Latest Ref Range: 70 - 99 mg/dL 124 (H) 266 (H) 321 (H) 158 (H)   Diabetes history: Type 2 DM Outpatient Diabetes medications: Novolog 70/30 50 units BID, Victoza 1.2 mg QD, Metformin 1000 mg BID Current orders for Inpatient glycemic control: Lantus 22 units BID, Novolog 0-20 units TID, Novolog 0-5 units QHS  Inpatient Diabetes Program Recommendations:    Noted taper of steroids, however it post prandials continue to exceed 180 mg/dL, consider adding Novolog 3 units TID (assuming patient is consuming >50% of meals).   Thanks, Bronson Curb, MSN, RNC-OB Diabetes Coordinator [phone] (8a-5p)

## 2020-07-06 NOTE — Progress Notes (Signed)
PROGRESS NOTE    [name]  NUU:[id number] DOB: 1961-05-31 DOA: 06/28/2020 PCP: Charlott Rakes, MD    Chief Complaint  Patient presents with  . Hand Pain  . Weakness    Brief Narrative:  59 year old gentleman with prior history of chronic gouty arthritis of multiple joint, stage II CKD, insulin-dependent diabetes mellitus, hypertension, hyperlipidemia, anemia of chronic disease, iron deficiency anemia, presents to ED for lethargy and multijoint pain. As per the sister-in-law at bedside patient was unable to stand or walk and was brought to ED.  He was found to be in AKI, hyperkalemic and in rhabdomyolysis. He was started on aggressive hydration and his creatinine has improved from 6.38 to 2.69. orthopedics consulted for his left elbow swelling and elbow joint effusion. Currently pt is on steroids for the gouty flare up of the multiple joints. He was also found to have guiac positive stools. GI consulted, recommended outpatient colonoscopy and EGD .  PT evaluation recommending SNF.   Due to persistent left upper extremity pain,  Ordered venous duplex of the left upper extremity to evaluate for DVT, it was positive for DVT, he was started on IV heparin but it was held for right sided nostril bleeding. ENT consulted, and waas found to have a clot in the right nostril, which was suctioned out.  No need for packing and bleeding resolved. Pt was restarted on heparin and later on transitioned to eliquis.  Recommend atleast another 7 days of prednisone for gout treatment on discharge. Possible discharge in am if elbow pain improves.    Assessment & Plan: AKI on stage II CKD In the setting of hydrochlorothiazide, ACE inhibitor, ibuprofen and Lasix. These were stopped he was started on IV fluids and her creatinine returned to baseline He will need to be off ACE inhibitor for at least 2 weeks.   Acute hyperkalemia: No EKG changes.  Improved with hydration.repeat potassium wnl.    Toxic  encephalopathy  probably uremic in nature slowly improving with improvement in renal parameters. Resolved.    Acute gouty flareup/ multiple joint pain, / left elbow effusion/ left elbow gout flare up: Patient was started on IV Solu-Medrol, transitioned to oral prednisone this was stopped and he related his pain started to progressively get worse, he restarted on the prednisone and colchicine will wean off prednisone 2 days and then continue colchicine. Ordered venous duplex to evaluate for DVT, was found to have DVT in the left brachial veins .  Continue to hold allopurinol.    Insulin-dependent diabetes mellitus, uncontrolled with hyperglycemia probably secondary to IV steroids CBG (last 3)  Recent Labs    07/05/20 2232 07/06/20 0617 07/06/20 1141  GLUCAP 321* 158* 99     A1c is 7.1, increased Lantus to 22 units twice daily,  cbgs are better  controlled.   Essential hypertension Well controlled.    Iron deficiency anemia, anemia of chronic disease Baseline hemoglobin around 11 in 2020 November.  Gradual decline to 9.7 this admission. Stool for occult blood is positive. Transfuse to keep hemoglobin greater than seven.  Continue to monitor. GI consulted to see if he needs a colonoscopy or EGD, recommended outpatient follow upw ith GI.  Hemoglobin stable around 10.   Elevated lactic acid Probably secondary to dehydration, normalized with IV fluids.   Nontraumatic rhabdomyolysis with AKI, anion gap metabolic acidosis Improving with IV fluids.  Statins were discontinued. CK levels are improving. CK level is 347 metabolic acidosis improving, bicarb level is 26.  Fever Probably  secondary to acute gouty flareup of multiple joints Blood cultures have been negative so far. Urine analysis is negative for infection. Chest x-ray does not show an acute cardiopulmonary abnormalities Resolved.    Mildly elevated liver enzymes  Probably secondary to rhabdomyolysis.   Left  brachial vein DVT of indeterminate age:  Started him on IV heparin, then started having nose bleeds.  IV heparin was held and ENT consulted for recommendations.  He was found t have a clot which was suctioned out and IV heparin was restarted and later on transitioned to eliquis for discharge.     DVT prophylaxis: Eliquis.  Code Status: full code.  Family Communication: none at bedside.  Disposition:   Status is: Inpatient  Remains inpatient appropriate because:Ongoing diagnostic testing needed not appropriate for outpatient work up and Inpatient level of care appropriate due to severity of illness, worsening pain in the elbow.    Dispo: The patient is from: Home              Anticipated d/c is to: SNF.              Anticipated d/c date is: 1 day              Patient currently is not medically stable to d/c.       Consultants:   Orthopedics  Nephrology  Gastroenterology.    Procedures: none.   Antimicrobials: none.   Subjective: He relates his pain is better he is just frustrated that he feels like he is not getting better.  Objective: Vitals:   07/06/20 0049 07/06/20 0419 07/06/20 0955 07/06/20 1146  BP: 96/75 106/77 111/73 98/74  Pulse: 89 82 93   Resp: 14 13 17 14   Temp: 97.8 F (36.6 C) 97.9 F (36.6 C) 97.6 F (36.4 C) (!) 97.5 F (36.4 C)  TempSrc: Oral Oral Oral Oral  SpO2: 100% 99% 100% 100%  Weight:      Height:        Intake/Output Summary (Last 24 hours) at 07/06/2020 1219 Last data filed at 07/06/2020 0800 Gross per 24 hour  Intake 360 ml  Output 1775 ml  Net -1415 ml   Filed Weights   06/28/20 1623 06/28/20 2116  Weight: 116.6 kg 114.2 kg    Examination: General exam: In no acute distress. Respiratory system: Good air movement and clear to auscultation. Cardiovascular system: S1 & S2 heard, RRR. No JVD. Gastrointestinal system: Abdomen is nondistended, soft and nontender.  Extremities: Swelling of multiple joints and wrist and  elbow Skin: No rashes, lesions or ulcers Psychiatry: Judgement and insight appear normal. Mood & affect appropriate.   Data Reviewed: I have personally reviewed following labs and imaging studies  CBC: Recent Labs  Lab 07/01/20 0324 07/02/20 0818 07/03/20 0122 07/04/20 0926 07/05/20 0557  WBC 10.4 8.8 10.0 14.4* 16.5*  HGB 10.2* 10.6* 10.2* 10.9* 10.1*  HCT 30.8* 34.0* 32.3* 34.4* 31.1*  MCV 74.4* 76.4* 75.8* 76.6* 76.6*  PLT 180 276 278 322 401    Basic Metabolic Panel: Recent Labs  Lab 07/01/20 0324 07/02/20 0818 07/03/20 0122 07/04/20 0926 07/05/20 0557  NA 141 144 143 140 138  K 3.9 3.6 4.2 4.0 4.4  CL 102 106 103 105 104  CO2 26 27 27 25 25   GLUCOSE 334* 156* 209* 142* 157*  BUN 93* 66* 50* 38* 37*  CREATININE 1.95* 1.49* 1.46* 1.29* 1.29*  CALCIUM 9.6 9.6 9.4 9.3 9.0    GFR: Estimated Creatinine Clearance: 71.1  mL/min (A) (by C-G formula based on SCr of 1.29 mg/dL (H)).  Liver Function Tests: Recent Labs  Lab 06/30/20 0223 07/01/20 0324 07/02/20 0818 07/03/20 0122  AST 151* 110* 69* 45*  ALT 66* 66* 56* 45*  ALKPHOS 69 61 55 60  BILITOT 0.8 0.6 0.9 1.0  PROT 7.1 7.1 7.2 6.9  ALBUMIN 2.5* 2.5* 2.7* 2.5*    CBG: Recent Labs  Lab 07/05/20 1118 07/05/20 1556 07/05/20 2232 07/06/20 0617 07/06/20 1141  GLUCAP 124* 266* 321* 158* 99     Recent Results (from the past 240 hour(s))  SARS Coronavirus 2 by RT PCR (hospital order, performed in [facility] hospital lab) Nasopharyngeal Nasopharyngeal Swab     Status: None   Collection Time: 06/28/20  6:43 PM   Specimen: Nasopharyngeal Swab  Result Value Ref Range Status   SARS Coronavirus 2 NEGATIVE NEGATIVE Final    Comment: (NOTE) SARS-CoV-2 target nucleic acids are NOT DETECTED.  The SARS-CoV-2 RNA is generally detectable in upper and lower respiratory specimens during the acute phase of infection. The lowest concentration of SARS-CoV-2 viral copies this assay can detect is 250 copies / mL.  A negative result does not preclude SARS-CoV-2 infection and should not be used as the sole basis for treatment or other patient management decisions.  A negative result may occur with improper specimen collection / handling, submission of specimen other than nasopharyngeal swab, presence of viral mutation(s) within the areas targeted by this assay, and inadequate number of viral copies (<250 copies / mL). A negative result must be combined with clinical observations, patient history, and epidemiological information.  Fact Sheet for Patients:   StrictlyIdeas.no  Fact Sheet for Healthcare Providers: BankingDealers.co.za  This test is not yet approved or  cleared by the [place] FDA and has been authorized for detection and/or diagnosis of SARS-CoV-2 by FDA under an Emergency Use Authorization (EUA).  This EUA will remain in effect (meaning this test can be used) for the duration of the COVID-19 declaration under Section 564(b)(1) of the Act, 21 U.S.C. section 360bbb-3(b)(1), unless the authorization is terminated or revoked sooner.  Performed at [facility], [place] [address] [place], [place] [address]   Culture, Urine     Status: None   Collection Time: 06/28/20 10:00 PM   Specimen: Urine, Catheterized  Result Value Ref Range Status   Specimen Description URINE, CATHETERIZED  Final   Special Requests NONE  Final   Culture   Final    NO GROWTH Performed at [facility] [address] [place], [place] [address]    Report Status 06/29/2020 FINAL  Final  Culture, blood (routine x 2)     Status: None   Collection Time: 06/28/20 11:51 PM   Specimen: BLOOD RIGHT HAND  Result Value Ref Range Status   Specimen Description BLOOD RIGHT HAND  Final   Special Requests   Final    BOTTLES DRAWN AEROBIC AND ANAEROBIC Blood Culture adequate volume   Culture   Final    NO GROWTH 5 DAYS Performed at [facility],  [place] [address] [place], [place] [address]    Report Status 07/04/2020 FINAL  Final  Culture, blood (routine x 2)     Status: None   Collection Time: 06/29/20  3:29 AM   Specimen: BLOOD RIGHT HAND  Result Value Ref Range Status   Specimen Description BLOOD RIGHT HAND  Final   Special Requests   Final    BOTTLES  DRAWN AEROBIC AND ANAEROBIC Blood Culture adequate volume   Culture   Final    NO GROWTH 5 DAYS Performed at [facility], [place] [address] [place], [place] [address]    Report Status 07/04/2020 FINAL  Final  SARS Coronavirus 2 by RT PCR (hospital order, performed in [facility] hospital lab) Nasopharyngeal Nasopharyngeal Swab     Status: None   Collection Time: 07/05/20 11:12 PM   Specimen: Nasopharyngeal Swab  Result Value Ref Range Status   SARS Coronavirus 2 NEGATIVE NEGATIVE Final    Comment: (NOTE) SARS-CoV-2 target nucleic acids are NOT DETECTED.  The SARS-CoV-2 RNA is generally detectable in upper and lower respiratory specimens during the acute phase of infection. The lowest concentration of SARS-CoV-2 viral copies this assay can detect is 250 copies / mL. A negative result does not preclude SARS-CoV-2 infection and should not be used as the sole basis for treatment or other patient management decisions.  A negative result may occur with improper specimen collection / handling, submission of specimen other than nasopharyngeal swab, presence of viral mutation(s) within the areas targeted by this assay, and inadequate number of viral copies (<250 copies / mL). A negative result must be combined with clinical observations, patient history, and epidemiological information.  Fact Sheet for Patients:   StrictlyIdeas.no  Fact Sheet for Healthcare Providers: BankingDealers.co.za  This test is not yet approved or  cleared by the [place] FDA and has been authorized for detection and/or diagnosis of SARS-CoV-2 by FDA  under an Emergency Use Authorization (EUA).  This EUA will remain in effect (meaning this test can be used) for the duration of the COVID-19 declaration under Section 564(b)(1) of the Act, 21 U.S.C. section 360bbb-3(b)(1), unless the authorization is terminated or revoked sooner.  Performed at [facility], [place] [address] [place], [place] [address]          Radiology Studies: No results found.      Scheduled Meds: . apixaban  10 mg Oral BID   Followed by  . [START ON 07/12/2020] apixaban  5 mg Oral BID  . aspirin EC  81 mg Oral Daily  . Chlorhexidine Gluconate Cloth  6 each Topical Daily  . ferrous sulfate  325 mg Oral Q breakfast  . insulin aspart  0-20 Units Subcutaneous TID WC  . insulin aspart  0-5 Units Subcutaneous QHS  . insulin glargine  22 Units Subcutaneous BID  . loratadine  10 mg Oral Daily  . pantoprazole  40 mg Oral QHS  . traZODone  50 mg Oral QHS   Continuous Infusions:    LOS: 8 days        Charlynne Cousins, MD [facility]   To contact the attending provider between 7A-7P or the covering provider during after hours 7P-7A, please log into the web site [email or web address] and access using universal [place] password for that web site. If you do not have the password, please call the hospital operator.  07/06/2020, 12:19 PM

## 2020-07-07 LAB — BASIC METABOLIC PANEL
Anion gap: 13 (ref 5–15)
BUN: 47 mg/dL — ABNORMAL HIGH (ref 6–20)
CO2: 19 mmol/L — ABNORMAL LOW (ref 22–32)
Calcium: 9.3 mg/dL (ref 8.9–10.3)
Chloride: 99 mmol/L (ref 98–111)
Creatinine, Ser: 1.53 mg/dL — ABNORMAL HIGH (ref 0.61–1.24)
GFR calc Af Amer: 56 mL/min — ABNORMAL LOW (ref >60)
GFR calc non Af Amer: 49 mL/min — ABNORMAL LOW (ref >60)
Glucose, Bld: 240 mg/dL — ABNORMAL HIGH (ref 70–99)
Potassium: 5.8 mmol/L — ABNORMAL HIGH (ref 3.5–5.1)
Sodium: 131 mmol/L — ABNORMAL LOW (ref 135–145)

## 2020-07-07 LAB — CBC
HCT: 31.7 % — ABNORMAL LOW (ref 39.0–52.0)
Hemoglobin: 9.9 g/dL — ABNORMAL LOW (ref 13.0–17.0)
MCH: 24.4 pg — ABNORMAL LOW (ref 26.0–34.0)
MCHC: 31.2 g/dL (ref 30.0–36.0)
MCV: 78.1 fL — ABNORMAL LOW (ref 80.0–100.0)
Platelets: 330 10*3/uL (ref 150–400)
RBC: 4.06 MIL/uL — ABNORMAL LOW (ref 4.22–5.81)
RDW: 18.7 % — ABNORMAL HIGH (ref 11.5–15.5)
WBC: 17.1 10*3/uL — ABNORMAL HIGH (ref 4.0–10.5)
nRBC: 0.4 % — ABNORMAL HIGH (ref 0.0–0.2)

## 2020-07-07 LAB — GLUCOSE, CAPILLARY
Glucose-Capillary: 143 mg/dL — ABNORMAL HIGH (ref 70–99)
Glucose-Capillary: 212 mg/dL — ABNORMAL HIGH (ref 70–99)
Glucose-Capillary: 245 mg/dL — ABNORMAL HIGH (ref 70–99)
Glucose-Capillary: 136 mg/dL — ABNORMAL HIGH (ref 70–99)

## 2020-07-07 MED ORDER — PREDNISONE 20 MG PO TABS
20.0000 mg | ORAL_TABLET | Freq: Every day | ORAL | Status: AC
  Administered 2020-07-08 – 2020-07-09 (×2): 20 mg via ORAL
  Filled 2020-07-07 (×2): qty 1

## 2020-07-07 MED ORDER — LIRAGLUTIDE 18 MG/3ML ~~LOC~~ SOPN: 1.2000 mg | PEN_INJECTOR | Freq: Every morning | SUBCUTANEOUS | Status: DC

## 2020-07-07 MED ORDER — METOPROLOL SUCCINATE ER 50 MG PO TB24
50.0000 mg | ORAL_TABLET | Freq: Every day | ORAL | Status: DC
  Administered 2020-07-07 – 2020-07-09 (×2): 50 mg via ORAL
  Filled 2020-07-07 (×5): qty 1

## 2020-07-07 NOTE — Progress Notes (Signed)
PROGRESS NOTE    [name]  EHU:[id number] DOB: 07/19/1961 DOA: 06/28/2020 PCP: Charlott Rakes, MD    Chief Complaint  Patient presents with   Hand Pain   Weakness    Brief Narrative:  59 year old gentleman with prior history of chronic gouty arthritis of multiple joint, stage II CKD, insulin-dependent diabetes mellitus, hypertension, hyperlipidemia, anemia of chronic disease, iron deficiency anemia, presents to ED for lethargy and multijoint pain. As per the sister-in-law at bedside patient was unable to stand or walk and was brought to ED.  He was found to be in AKI, hyperkalemic and in rhabdomyolysis. He was started on aggressive hydration and his creatinine has improved from 6.38 to 2.69. orthopedics consulted for his left elbow swelling and elbow joint effusion. Currently pt is on steroids for the gouty flare up of the multiple joints. He was also found to have guiac positive stools. GI consulted, recommended outpatient colonoscopy and EGD .  PT evaluation recommending SNF.   Due to persistent left upper extremity pain,  Ordered venous duplex of the left upper extremity to evaluate for DVT, it was positive for DVT, he was started on IV heparin but it was held for right sided nostril bleeding. ENT consulted, and waas found to have a clot in the right nostril, which was suctioned out.  No need for packing and bleeding resolved. Pt was restarted on heparin and later on transitioned to eliquis.  Recommend atleast another 7 days of prednisone for gout treatment on discharge. We will continue steroids for 2 additional days he was also started on colchicine twice a day.  Assessment & Plan: AKI on stage II CKD In the setting of hydrochlorothiazide, ACE inhibitor, ibuprofen and Lasix. These were stopped he was started on IV fluids and her creatinine returned to baseline He will need to be off ACE inhibitor for at least 2 weeks. Check a basic metabolic panel.  Acute  hyperkalemia: No EKG changes.  Improved with hydration.repeat potassium wnl.    Toxic encephalopathy  probably uremic in nature slowly improving with improvement in renal parameters. Resolved.    Acute gouty flareup/ multiple joint pain, / left elbow effusion/ left elbow gout flare up: Patient was started on IV Solu-Medrol, transitioned to oral prednisone this was stopped and he related his pain started to progressively get worse, he restarted on the prednisone and colchicine will wean off prednisone 2 days and then continue colchicine. Ordered venous duplex to evaluate for DVT, was found to have DVT in the left brachial veins .  Continue to hold allopurinol.    Insulin-dependent diabetes mellitus, uncontrolled with hyperglycemia probably secondary to IV steroids CBG (last 3)  Recent Labs    07/06/20 1810 07/06/20 2104 07/07/20 0600  GLUCAP 230* 212* 143*     A1c is 7.1, increased Lantus to 22 units twice daily,  cbgs are better  controlled.   Essential hypertension Well controlled.    Iron deficiency anemia, anemia of chronic disease Baseline hemoglobin around 11 in 2020 November.  Gradual decline to 9.7 this admission. Stool for occult blood is positive. Transfuse to keep hemoglobin greater than seven.  Continue to monitor. GI consulted to see if he needs a colonoscopy or EGD, recommended outpatient follow upw ith GI.  Hemoglobin stable around 10.   Elevated lactic acid Probably secondary to dehydration, normalized with IV fluids.   Nontraumatic rhabdomyolysis with AKI, anion gap metabolic acidosis Improving with IV fluids.  Statins were discontinued. CK levels are improving. CK level is  536 metabolic acidosis improving, bicarb level is 26.  Fever Probably secondary to acute gouty flareup of multiple joints Blood cultures have been negative so far. Urine analysis is negative for infection. Chest x-ray does not show an acute cardiopulmonary  abnormalities Resolved.  Check a CBC today.   Mildly elevated liver enzymes  Probably secondary to rhabdomyolysis.   Left brachial vein DVT of indeterminate age:  Started him on IV heparin, then started having nose bleeds.  IV heparin was held and ENT consulted for recommendations.  He was found t have a clot which was suctioned out and IV heparin was restarted and later on transitioned to eliquis for discharge.     DVT prophylaxis: Eliquis.  Code Status: full code.  Family Communication: none at bedside.  Disposition:   Status is: Inpatient  Remains inpatient appropriate because:Ongoing diagnostic testing needed not appropriate for outpatient work up and Inpatient level of care appropriate due to severity of illness, worsening pain in the elbow.  Physical therapy evaluated the patient recommended skilled nursing facility, the patient can probably be discharged in 24 to 48 hours.  He does not want to go to a skilled nursing facility will have to arrange home health PT.   Dispo: The patient is from: Home              Anticipated d/c is to: SNF.              Anticipated d/c date is: 1 day              Patient currently is not medically stable to d/c.       Consultants:   Orthopedics  Nephrology  Gastroenterology.    Procedures: none.   Antimicrobials: none.   Subjective: Relates he is still having pain in his wrist and elbow.  Objective: Vitals:   07/06/20 1146 07/06/20 2023 07/07/20 0009 07/07/20 0308  BP: 98/74 105/68 94/81 132/71  Pulse:  96 94 100  Resp: 14 19 17 18   Temp: (!) 97.5 F (36.4 C) 98.1 F (36.7 C) 98.3 F (36.8 C) 98 F (36.7 C)  TempSrc: Oral Oral Oral Oral  SpO2: 100% 99% 100% 98%  Weight:      Height:        Intake/Output Summary (Last 24 hours) at 07/07/2020 0915 Last data filed at 07/07/2020 0309 Gross per 24 hour  Intake 240 ml  Output 800 ml  Net -560 ml   Filed Weights   06/28/20 1623 06/28/20 2116  Weight: 116.6 kg  114.2 kg    Examination: General exam: In no acute distress. Respiratory system: Good air movement and clear to auscultation. Cardiovascular system: S1 & S2 heard, RRR. No JVD. Gastrointestinal system: Abdomen is nondistended, soft and nontender.  Extremities: Multiple joint swelling including hands wrist and elbow. Skin: No rashes, lesions or ulcers Psychiatry: Judgement and insight appear normal. Mood & affect appropriate.   Data Reviewed: I have personally reviewed following labs and imaging studies  CBC: Recent Labs  Lab 07/01/20 0324 07/02/20 0818 07/03/20 0122 07/04/20 0926 07/05/20 0557  WBC 10.4 8.8 10.0 14.4* 16.5*  HGB 10.2* 10.6* 10.2* 10.9* 10.1*  HCT 30.8* 34.0* 32.3* 34.4* 31.1*  MCV 74.4* 76.4* 75.8* 76.6* 76.6*  PLT 180 276 278 322 644    Basic Metabolic Panel: Recent Labs  Lab 07/01/20 0324 07/02/20 0818 07/03/20 0122 07/04/20 0926 07/05/20 0557  NA 141 144 143 140 138  K 3.9 3.6 4.2 4.0 4.4  CL 102 106  103 105 104  CO2 26 27 27 25 25   GLUCOSE 334* 156* 209* 142* 157*  BUN 93* 66* 50* 38* 37*  CREATININE 1.95* 1.49* 1.46* 1.29* 1.29*  CALCIUM 9.6 9.6 9.4 9.3 9.0    GFR: Estimated Creatinine Clearance: 71.1 mL/min (A) (by C-G formula based on SCr of 1.29 mg/dL (H)).  Liver Function Tests: Recent Labs  Lab 07/01/20 0324 07/02/20 0818 07/03/20 0122  AST 110* 69* 45*  ALT 66* 56* 45*  ALKPHOS 61 55 60  BILITOT 0.6 0.9 1.0  PROT 7.1 7.2 6.9  ALBUMIN 2.5* 2.7* 2.5*    CBG: Recent Labs  Lab 07/06/20 0617 07/06/20 1141 07/06/20 1810 07/06/20 2104 07/07/20 0600  GLUCAP 158* 99 230* 212* 143*     Recent Results (from the past 240 hour(s))  SARS Coronavirus 2 by RT PCR (hospital order, performed in [facility] hospital lab) Nasopharyngeal Nasopharyngeal Swab     Status: None   Collection Time: 06/28/20  6:43 PM   Specimen: Nasopharyngeal Swab  Result Value Ref Range Status   SARS Coronavirus 2 NEGATIVE NEGATIVE Final    Comment:  (NOTE) SARS-CoV-2 target nucleic acids are NOT DETECTED.  The SARS-CoV-2 RNA is generally detectable in upper and lower respiratory specimens during the acute phase of infection. The lowest concentration of SARS-CoV-2 viral copies this assay can detect is 250 copies / mL. A negative result does not preclude SARS-CoV-2 infection and should not be used as the sole basis for treatment or other patient management decisions.  A negative result may occur with improper specimen collection / handling, submission of specimen other than nasopharyngeal swab, presence of viral mutation(s) within the areas targeted by this assay, and inadequate number of viral copies (<250 copies / mL). A negative result must be combined with clinical observations, patient history, and epidemiological information.  Fact Sheet for Patients:   StrictlyIdeas.no  Fact Sheet for Healthcare Providers: BankingDealers.co.za  This test is not yet approved or  cleared by the [place] FDA and has been authorized for detection and/or diagnosis of SARS-CoV-2 by FDA under an Emergency Use Authorization (EUA).  This EUA will remain in effect (meaning this test can be used) for the duration of the COVID-19 declaration under Section 564(b)(1) of the Act, 21 U.S.C. section 360bbb-3(b)(1), unless the authorization is terminated or revoked sooner.  Performed at [facility], [place] [address], [place], [place] [address]   Culture, Urine     Status: None   Collection Time: 06/28/20 10:00 PM   Specimen: Urine, Catheterized  Result Value Ref Range Status   Specimen Description URINE, CATHETERIZED  Final   Special Requests NONE  Final   Culture   Final    NO GROWTH Performed at [facility] [address], [place], [place] [address]    Report Status 06/29/2020 FINAL  Final  Culture, blood (routine x 2)     Status: None   Collection Time: 06/28/20 11:51 PM    Specimen: BLOOD RIGHT HAND  Result Value Ref Range Status   Specimen Description BLOOD RIGHT HAND  Final   Special Requests   Final    BOTTLES DRAWN AEROBIC AND ANAEROBIC Blood Culture adequate volume   Culture   Final    NO GROWTH 5 DAYS Performed at [facility], [place] [address] [place], [place] [address]    Report Status 07/04/2020 FINAL  Final  Culture, blood (routine x 2)     Status: None  Collection Time: 06/29/20  3:29 AM   Specimen: BLOOD RIGHT HAND  Result Value Ref Range Status   Specimen Description BLOOD RIGHT HAND  Final   Special Requests   Final    BOTTLES DRAWN AEROBIC AND ANAEROBIC Blood Culture adequate volume   Culture   Final    NO GROWTH 5 DAYS Performed at [facility], [address] [place], [place] [address]    Report Status 07/04/2020 FINAL  Final  SARS Coronavirus 2 by RT PCR (hospital order, performed in [facility] hospital lab) Nasopharyngeal Nasopharyngeal Swab     Status: None   Collection Time: 07/05/20 11:12 PM   Specimen: Nasopharyngeal Swab  Result Value Ref Range Status   SARS Coronavirus 2 NEGATIVE NEGATIVE Final    Comment: (NOTE) SARS-CoV-2 target nucleic acids are NOT DETECTED.  The SARS-CoV-2 RNA is generally detectable in upper and lower respiratory specimens during the acute phase of infection. The lowest concentration of SARS-CoV-2 viral copies this assay can detect is 250 copies / mL. A negative result does not preclude SARS-CoV-2 infection and should not be used as the sole basis for treatment or other patient management decisions.  A negative result may occur with improper specimen collection / handling, submission of specimen other than nasopharyngeal swab, presence of viral mutation(s) within the areas targeted by this assay, and inadequate number of viral copies (<250 copies / mL). A negative result must be combined with clinical observations, patient history, and epidemiological information.  Fact Sheet for  Patients:   StrictlyIdeas.no  Fact Sheet for Healthcare Providers: BankingDealers.co.za  This test is not yet approved or  cleared by the [place] FDA and has been authorized for detection and/or diagnosis of SARS-CoV-2 by FDA under an Emergency Use Authorization (EUA).  This EUA will remain in effect (meaning this test can be used) for the duration of the COVID-19 declaration under Section 564(b)(1) of the Act, 21 U.S.C. section 360bbb-3(b)(1), unless the authorization is terminated or revoked sooner.  Performed at [facility], [place] [address] [place], [place] [address]          Radiology Studies: No results found.      Scheduled Meds:  apixaban  10 mg Oral BID   Followed by   Derrill Memo ON 07/12/2020] apixaban  5 mg Oral BID   aspirin EC  81 mg Oral Daily   Chlorhexidine Gluconate Cloth  6 each Topical Daily   colchicine  0.6 mg Oral BID   cyclobenzaprine  10 mg Oral TID   ferrous sulfate  325 mg Oral Q breakfast   insulin aspart  0-20 Units Subcutaneous TID WC   insulin aspart  0-5 Units Subcutaneous QHS   insulin glargine  22 Units Subcutaneous BID   loratadine  10 mg Oral Daily   metFORMIN  1,000 mg Oral BID WC   pantoprazole  40 mg Oral QHS   predniSONE  10 mg Oral Q breakfast   traZODone  50 mg Oral QHS   Continuous Infusions:    LOS: 9 days        Charlynne Cousins, MD [facility]   To contact the attending provider between 7A-7P or the covering provider during after hours 7P-7A, please log into the web site [email or web address] and access using universal [place] password for that web site. If you do not have the password, please call the hospital operator.  07/07/2020, 9:15 AM

## 2020-07-07 NOTE — TOC Progression Note (Signed)
Transition of Care [facility]) - Progression Note    Patient Details  Name: [name] MRN: [id number] Date of Birth: [birth year]  Transition of Care [facility]) CM/SW [place], [place] Phone Number: 07/07/2020, 4:23 PM  Clinical Narrative:    Insurance pending with [employer]. They don't admit during the weekend. TOC team will continue to follow.   Expected Discharge Plan: [place] Barriers to Discharge: Ship broker, Continued Medical Work up  Expected Discharge Plan and Services Expected Discharge Plan: [place] In-house Referral: Clinical Social Work                                             Social Determinants of Health (SDOH) Interventions    Readmission Risk Interventions No flowsheet data found.

## 2020-07-08 DIAGNOSIS — N1831 Chronic kidney disease, stage 3a: Secondary | ICD-10-CM

## 2020-07-08 DIAGNOSIS — N170 Acute kidney failure with tubular necrosis: Principal | ICD-10-CM

## 2020-07-08 LAB — CBC
HCT: 25.9 % — ABNORMAL LOW (ref 39.0–52.0)
Hemoglobin: 8.4 g/dL — ABNORMAL LOW (ref 13.0–17.0)
MCH: 24.6 pg — ABNORMAL LOW (ref 26.0–34.0)
MCHC: 32.4 g/dL (ref 30.0–36.0)
MCV: 76 fL — ABNORMAL LOW (ref 80.0–100.0)
Platelets: 276 10*3/uL (ref 150–400)
RBC: 3.41 MIL/uL — ABNORMAL LOW (ref 4.22–5.81)
RDW: 18 % — ABNORMAL HIGH (ref 11.5–15.5)
WBC: 13.5 10*3/uL — ABNORMAL HIGH (ref 4.0–10.5)
nRBC: 0.2 % (ref 0.0–0.2)

## 2020-07-08 LAB — BASIC METABOLIC PANEL
Anion gap: 10 (ref 5–15)
BUN: 46 mg/dL — ABNORMAL HIGH (ref 6–20)
CO2: 21 mmol/L — ABNORMAL LOW (ref 22–32)
Calcium: 8.9 mg/dL (ref 8.9–10.3)
Chloride: 102 mmol/L (ref 98–111)
Creatinine, Ser: 1.38 mg/dL — ABNORMAL HIGH (ref 0.61–1.24)
GFR calc Af Amer: >60 mL/min (ref >60)
GFR calc non Af Amer: 55 mL/min — ABNORMAL LOW (ref >60)
Glucose, Bld: 112 mg/dL — ABNORMAL HIGH (ref 70–99)
Potassium: 4.8 mmol/L (ref 3.5–5.1)
Sodium: 133 mmol/L — ABNORMAL LOW (ref 135–145)

## 2020-07-08 LAB — GLUCOSE, CAPILLARY
Glucose-Capillary: 140 mg/dL — ABNORMAL HIGH (ref 70–99)
Glucose-Capillary: 171 mg/dL — ABNORMAL HIGH (ref 70–99)
Glucose-Capillary: 197 mg/dL — ABNORMAL HIGH (ref 70–99)
Glucose-Capillary: 210 mg/dL — ABNORMAL HIGH (ref 70–99)

## 2020-07-08 MED ORDER — SODIUM CHLORIDE 0.9 % IV SOLN: INTRAVENOUS | Status: DC

## 2020-07-08 NOTE — Progress Notes (Signed)
PROGRESS NOTE    [name]  TML:[id number] DOB: [birth year] DOA: 06/28/2020 PCP: Charlott Rakes, MD     Brief Narrative:  59 year old BM PMHx  chronic gouty arthritis of multiple joint, stage II CKD, insulin-dependent diabetes mellitus, hypertension, hyperlipidemia, anemia of chronic disease, iron deficiency anemia, presents to ED for lethargy and multijoint pain. As per the sister-in-law at bedside patient was unable to stand or walk and was brought to ED.  He was found to be in AKI, hyperkalemic and in rhabdomyolysis. He was started on aggressive hydration and his creatinine has improved from 6.38 to 2.69. orthopedics consulted for his left elbow swelling and elbow joint effusion. Currently pt is on steroids for the gouty flare up of the multiple joints. He was also found to have guiac positive stools. GI consulted, recommended outpatient colonoscopy and EGD .  PT evaluation recommending SNF.   Due to persistent left upper extremity pain,  Ordered venous duplex of the left upper extremity to evaluate for DVT, it was positive for DVT, he was started on IV heparin but it was held for right sided nostril bleeding. ENT consulted, and waas found to have a clot in the right nostril, which was suctioned out.  No need for packing and bleeding resolved. Pt was restarted on heparin and later on transitioned to eliquis.  Recommend atleast another 7 days of prednisone for gout treatment on discharge. We will continue steroids for 2 additional days he was also started on colchicine twice a day.   Subjective: A/O x4, negative CP, negative S OB.  Sitting comfortably in chair.   Assessment & Plan: Covid vaccination; vaccinated   Active Problems:   AKI (acute kidney injury) ([place])   Arthralgia   Left elbow pain   Left hand pain   Non-traumatic rhabdomyolysis   Acute on CKD stage II -HCTZ, ACEI, ibuprofen, Lasix held  -Hold ACEI for at least 2 weeks  Lab Results  Component Value Date    CREATININE 1.19 07/09/2020   CREATININE 1.38 (H) 07/08/2020   CREATININE 1.53 (H) 07/07/2020   CREATININE 1.29 (H) 07/05/2020   CREATININE 1.29 (H) 07/04/2020  -Creatinine has returned to normal  Acute hyperkalemia: No EKG changes.  Improved with hydration.repeat potassium wnl.    Toxic encephalopathy  -Resolved  Acute gouty flareup/ multiple joint pain, / left elbow effusion/ left elbow gout flare up: -4/26 uric acid= 14.1 -8/21 uric acid= pending -Continue steroids -Continue to hold allopurinol.  DM type II uncontrolled with complication -6/81 hemoglobin A1c= 7.1  CBG (last 3)  Recent Labs    07/07/20 2154 07/08/20 0614 07/08/20 1109  GLUCAP 136* 140* 171*       Insulin-dependent diabetes mellitus, uncontrolled with hyperglycemia probably secondary to IV steroids  Essential hypertension Well controlled.    Iron deficiency anemia, anemia of chronic disease Baseline hemoglobin around 11 in 2020 November.  Gradual decline to 9.7 this admission. Stool for occult blood is positive. Transfuse to keep hemoglobin greater than seven.  Continue to monitor. GI consulted to see if he needs a colonoscopy or EGD, recommended outpatient follow upw ith GI.  Hemoglobin stable around 10.   Elevated lactic acid Probably secondary to dehydration, normalized with IV fluids.   Nontraumatic rhabdomyolysis with AKI, anion gap metabolic acidosis Improving with IV fluids.  Statins were discontinued. CK levels are improving. CK level is 275 metabolic acidosis improving, bicarb level is 26. -Normal saline 52ml/hr   Fever Probably secondary to acute gouty flareup of multiple joints Blood  cultures have been negative so far. Urine analysis is negative for infection. Chest x-ray does not show an acute cardiopulmonary abnormalities Resolved.  Check a CBC today.   Mildly elevated liver enzymes  Probably secondary to rhabdomyolysis.  Left brachial vein DVT of  indeterminate age:  Started him on IV heparin, then started having nose bleeds.  IV heparin was held and ENT consulted for recommendations.  He was found t have a clot which was suctioned out and IV heparin was restarted and later on transitioned to eliquis for discharge.      DVT prophylaxis: Eliquis Code Status:  Family Communication:  Status is: Inpatient    Dispo: The patient is from: Home              Anticipated d/c is to: SNF              Anticipated d/c date is:??              Patient currently unstable      Consultants:    Procedures/Significant Events:    I have personally reviewed and interpreted all radiology studies and my findings are as above.  VENTILATOR SETTINGS:    Cultures   Antimicrobials:    Devices    LINES / TUBES:      Continuous Infusions:   Objective: Vitals:   07/08/20 0001 07/08/20 0422 07/08/20 0822 07/08/20 1100  BP: 91/60 91/60 103/64 100/60  Pulse: 80 78 83 93  Resp: 16 16 14 15   Temp: 98 F (36.7 C) 98.1 F (36.7 C) 97.9 F (36.6 C) 97.8 F (36.6 C)  TempSrc: Oral Oral Oral Oral  SpO2: 99% 98% 100% 100%  Weight:      Height:        Intake/Output Summary (Last 24 hours) at 07/08/2020 1325 Last data filed at 07/08/2020 0423 Gross per 24 hour  Intake --  Output 300 ml  Net -300 ml   Filed Weights   06/28/20 1623 06/28/20 2116  Weight: 116.6 kg 114.2 kg    Examination:  General: A/O x4, No acute respiratory distress Eyes: negative scleral hemorrhage, negative anisocoria, negative icterus ENT: Negative Runny nose, negative gingival bleeding, Neck:  Negative scars, masses, torticollis, lymphadenopathy, JVD Lungs: Clear to auscultation bilaterally without wheezes or crackles Cardiovascular: Regular rate and rhythm without murmur gallop or rub normal S1 and S2 Abdomen: negative abdominal pain, nondistended, positive soft, bowel sounds, no rebound, no ascites, no appreciable mass Extremities: patient  with left hand and wrist swelling extremely tender PCP joints. Skin: Negative rashes, lesions, ulcers Psychiatric:  Negative depression, negative anxiety, negative fatigue, negative mania  Central nervous system:  Cranial nerves II through XII intact, tongue/uvula midline, all extremities muscle strength 5/5, sensation intact throughout, negative dysarthria, negative expressive aphasia, negative receptive aphasia.  .     Data Reviewed: Care during the described time interval was provided by me .  I have reviewed this patient's available data, including medical history, events of note, physical examination, and all test results as part of my evaluation.  CBC: Recent Labs  Lab 07/03/20 0122 07/04/20 0926 07/05/20 0557 07/07/20 1750 07/08/20 0137  WBC 10.0 14.4* 16.5* 17.1* 13.5*  HGB 10.2* 10.9* 10.1* 9.9* 8.4*  HCT 32.3* 34.4* 31.1* 31.7* 25.9*  MCV 75.8* 76.6* 76.6* 78.1* 76.0*  PLT 278 322 320 330 329   Basic Metabolic Panel: Recent Labs  Lab 07/03/20 0122 07/04/20 0926 07/05/20 0557 07/07/20 1750 07/08/20 0137  NA 143 140 138 131* 133*  K 4.2 4.0 4.4 5.8* 4.8  CL 103 105 104 99 102  CO2 27 25 25  19* 21*  GLUCOSE 209* 142* 157* 240* 112*  BUN 50* 38* 37* 47* 46*  CREATININE 1.46* 1.29* 1.29* 1.53* 1.38*  CALCIUM 9.4 9.3 9.0 9.3 8.9   GFR: Estimated Creatinine Clearance: 66.5 mL/min (A) (by C-G formula based on SCr of 1.38 mg/dL (H)). Liver Function Tests: Recent Labs  Lab 07/02/20 0818 07/03/20 0122  AST 69* 45*  ALT 56* 45*  ALKPHOS 55 60  BILITOT 0.9 1.0  PROT 7.2 6.9  ALBUMIN 2.7* 2.5*   No results for input(s): LIPASE, AMYLASE in the last 168 hours. No results for input(s): AMMONIA in the last 168 hours. Coagulation Profile: No results for input(s): INR, PROTIME in the last 168 hours. Cardiac Enzymes: Recent Labs  Lab 07/02/20 0818 07/03/20 0122  CKTOTAL 708* 356   BNP (last 3 results) No results for input(s): PROBNP in the last 8760  hours. HbA1C: No results for input(s): HGBA1C in the last 72 hours. CBG: Recent Labs  Lab 07/07/20 1103 07/07/20 1622 07/07/20 2154 07/08/20 0614 07/08/20 1109  GLUCAP 212* 245* 136* 140* 171*   Lipid Profile: No results for input(s): CHOL, HDL, LDLCALC, TRIG, CHOLHDL, LDLDIRECT in the last 72 hours. Thyroid Function Tests: No results for input(s): TSH, T4TOTAL, FREET4, T3FREE, THYROIDAB in the last 72 hours. Anemia Panel: No results for input(s): VITAMINB12, FOLATE, FERRITIN, TIBC, IRON, RETICCTPCT in the last 72 hours. Sepsis Labs: No results for input(s): PROCALCITON, LATICACIDVEN in the last 168 hours.  Recent Results (from the past 240 hour(s))  SARS Coronavirus 2 by RT PCR (hospital order, performed in [facility] hospital lab) Nasopharyngeal Nasopharyngeal Swab     Status: None   Collection Time: 06/28/20  6:43 PM   Specimen: Nasopharyngeal Swab  Result Value Ref Range Status   SARS Coronavirus 2 NEGATIVE NEGATIVE Final    Comment: (NOTE) SARS-CoV-2 target nucleic acids are NOT DETECTED.  The SARS-CoV-2 RNA is generally detectable in upper and lower respiratory specimens during the acute phase of infection. The lowest concentration of SARS-CoV-2 viral copies this assay can detect is 250 copies / mL. A negative result does not preclude SARS-CoV-2 infection and should not be used as the sole basis for treatment or other patient management decisions.  A negative result may occur with improper specimen collection / handling, submission of specimen other than nasopharyngeal swab, presence of viral mutation(s) within the areas targeted by this assay, and inadequate number of viral copies (<250 copies / mL). A negative result must be combined with clinical observations, patient history, and epidemiological information.  Fact Sheet for Patients:   StrictlyIdeas.no  Fact Sheet for Healthcare  Providers: BankingDealers.co.za  This test is not yet approved or  cleared by the [place] FDA and has been authorized for detection and/or diagnosis of SARS-CoV-2 by FDA under an Emergency Use Authorization (EUA).  This EUA will remain in effect (meaning this test can be used) for the duration of the COVID-19 declaration under Section 564(b)(1) of the Act, 21 U.S.C. section 360bbb-3(b)(1), unless the authorization is terminated or revoked sooner.  Performed at [facility], [place] [address], [place], [place] [address]   Culture, Urine     Status: None   Collection Time: 06/28/20 10:00 PM   Specimen: Urine, Catheterized  Result Value Ref Range Status   Specimen Description URINE, CATHETERIZED  Final   Special Requests NONE  Final   Culture  Final    NO GROWTH Performed at [facility], [place] [address], [place], [place] [address]    Report Status 06/29/2020 FINAL  Final  Culture, blood (routine x 2)     Status: None   Collection Time: 06/28/20 11:51 PM   Specimen: BLOOD RIGHT HAND  Result Value Ref Range Status   Specimen Description BLOOD RIGHT HAND  Final   Special Requests   Final    BOTTLES DRAWN AEROBIC AND ANAEROBIC Blood Culture adequate volume   Culture   Final    NO GROWTH 5 DAYS Performed at [facility], [place] [address], [place], [place] [address]    Report Status 07/04/2020 FINAL  Final  Culture, blood (routine x 2)     Status: None   Collection Time: 06/29/20  3:29 AM   Specimen: BLOOD RIGHT HAND  Result Value Ref Range Status   Specimen Description BLOOD RIGHT HAND  Final   Special Requests   Final    BOTTLES DRAWN AEROBIC AND ANAEROBIC Blood Culture adequate volume   Culture   Final    NO GROWTH 5 DAYS Performed at [facility], [place] [address] [place], [place] [address]    Report Status 07/04/2020 FINAL  Final  SARS Coronavirus 2 by RT PCR (hospital order, performed in [facility] hospital lab)  Nasopharyngeal Nasopharyngeal Swab     Status: None   Collection Time: 07/05/20 11:12 PM   Specimen: Nasopharyngeal Swab  Result Value Ref Range Status   SARS Coronavirus 2 NEGATIVE NEGATIVE Final    Comment: (NOTE) SARS-CoV-2 target nucleic acids are NOT DETECTED.  The SARS-CoV-2 RNA is generally detectable in upper and lower respiratory specimens during the acute phase of infection. The lowest concentration of SARS-CoV-2 viral copies this assay can detect is 250 copies / mL. A negative result does not preclude SARS-CoV-2 infection and should not be used as the sole basis for treatment or other patient management decisions.  A negative result may occur with improper specimen collection / handling, submission of specimen other than nasopharyngeal swab, presence of viral mutation(s) within the areas targeted by this assay, and inadequate number of viral copies (<250 copies / mL). A negative result must be combined with clinical observations, patient history, and epidemiological information.  Fact Sheet for Patients:   StrictlyIdeas.no  Fact Sheet for Healthcare Providers: BankingDealers.co.za  This test is not yet approved or  cleared by the [place] FDA and has been authorized for detection and/or diagnosis of SARS-CoV-2 by FDA under an Emergency Use Authorization (EUA).  This EUA will remain in effect (meaning this test can be used) for the duration of the COVID-19 declaration under Section 564(b)(1) of the Act, 21 U.S.C. section 360bbb-3(b)(1), unless the authorization is terminated or revoked sooner.  Performed at [facility], [place] [address], [place],  [address]          Radiology Studies: No results found.      Scheduled Meds: . apixaban  10 mg Oral BID   Followed by  . [START ON 07/12/2020] apixaban  5 mg Oral BID  . aspirin EC  81 mg Oral Daily  . Chlorhexidine Gluconate Cloth  6 each Topical  Daily  . colchicine  0.6 mg Oral BID  . cyclobenzaprine  10 mg Oral TID  . ferrous sulfate  325 mg Oral Q breakfast  . insulin aspart  0-20 Units Subcutaneous TID WC  . insulin aspart  0-5 Units Subcutaneous  QHS  . insulin glargine  22 Units Subcutaneous BID  . loratadine  10 mg Oral Daily  . metFORMIN  1,000 mg Oral BID WC  . metoprolol succinate  50 mg Oral Daily  . pantoprazole  40 mg Oral QHS  . predniSONE  20 mg Oral Q breakfast  . traZODone  50 mg Oral QHS   Continuous Infusions:   LOS: 10 days    Time spent:40 min    Dyanara Cozza, Geraldo Docker, MD [facility] Pager [phone]  If 7PM-7AM, please contact night-coverage [email or web address] Password [email or web address] 07/08/2020, 1:25 PM

## 2020-07-08 NOTE — Progress Notes (Signed)
[place] for heparin Indication: LUE DVT  No Known Allergies  Patient Measurements: Height: 5\' 5"  (165.1 cm) Weight: 114.2 kg (251 lb 12.3 oz) IBW/kg (Calculated) : 61.5 Heparin Dosing Weight: 88kg  Vital Signs: Temp: 97.8 F (36.6 C) (08/21 1100) Temp Source: Oral (08/21 1100) BP: 100/60 (08/21 1100) Pulse Rate: 93 (08/21 1100)  Labs: Recent Labs    07/07/20 1750 07/08/20 0137  HGB 9.9* 8.4*  HCT 31.7* 25.9*  PLT 330 276  CREATININE 1.53* 1.38*    Estimated Creatinine Clearance: 66.5 mL/min (A) (by C-G formula based on SCr of 1.38 mg/dL (H)).   Medical History: Past Medical History:  Diagnosis Date  . Gouty bursitis, elbow  10/18/2015  . Hx of gout   . Hyperlipidemia   . Hypertension   . Knee pain, chronic   . Type II diabetes mellitus [facility])      Assessment: 59 yo M admitted with AKI and gout flare and LUE pain, now found to have age indeterminate LUE DVT on dopplers 8/16. Heparin was initiated for DVT but held after patient had a nose bleed (ENT found a clot and suctioned out). Heparin was restarted then transitioned to apixaban. Of note, pt had guiac positive stools on admission and GI recommended outpatient colonoscopy + EGD.  Hgb has been trending down, now 8.4. PLT 276. Kidney function is improving. Note pt is also on aspirin. No other signs of bleeding noted since transitioning to apixaban.  Goal of Therapy: Monitor platelets by anticoagulation protocol: Yes   Plan:  -Continue apixaban 10mg  BID until 8/25, then begin 5mg  BID -Closely follow daily CBC, monitor for signs of bleeding  Mercy Riding, PharmD PGY1 Acute Care Pharmacy Resident Please refer to [facility] for unit-specific pharmacist

## 2020-07-09 LAB — GLUCOSE, CAPILLARY
Glucose-Capillary: 77 mg/dL (ref 70–99)
Glucose-Capillary: 150 mg/dL — ABNORMAL HIGH (ref 70–99)
Glucose-Capillary: 184 mg/dL — ABNORMAL HIGH (ref 70–99)
Glucose-Capillary: 135 mg/dL — ABNORMAL HIGH (ref 70–99)

## 2020-07-09 LAB — COMPREHENSIVE METABOLIC PANEL
ALT: 27 U/L (ref 0–44)
AST: 24 U/L (ref 15–41)
Albumin: 2.5 g/dL — ABNORMAL LOW (ref 3.5–5.0)
Alkaline Phosphatase: 53 U/L (ref 38–126)
Anion gap: 9 (ref 5–15)
BUN: 38 mg/dL — ABNORMAL HIGH (ref 6–20)
CO2: 21 mmol/L — ABNORMAL LOW (ref 22–32)
Calcium: 8.7 mg/dL — ABNORMAL LOW (ref 8.9–10.3)
Chloride: 105 mmol/L (ref 98–111)
Creatinine, Ser: 1.19 mg/dL (ref 0.61–1.24)
GFR calc Af Amer: >60 mL/min (ref >60)
GFR calc non Af Amer: >60 mL/min (ref >60)
Glucose, Bld: 98 mg/dL (ref 70–99)
Potassium: 5 mmol/L (ref 3.5–5.1)
Sodium: 135 mmol/L (ref 135–145)
Total Bilirubin: 1 mg/dL (ref 0.3–1.2)
Total Protein: 6.1 g/dL — ABNORMAL LOW (ref 6.5–8.1)

## 2020-07-09 LAB — CBC WITH DIFFERENTIAL/PLATELET
Abs Immature Granulocytes: 0.3 10*3/uL — ABNORMAL HIGH (ref 0.00–0.07)
Basophils Absolute: 0 10*3/uL (ref 0.0–0.1)
Basophils Relative: 0 %
Eosinophils Absolute: 0.1 10*3/uL (ref 0.0–0.5)
Eosinophils Relative: 0 %
HCT: 24.3 % — ABNORMAL LOW (ref 39.0–52.0)
Hemoglobin: 7.6 g/dL — ABNORMAL LOW (ref 13.0–17.0)
Immature Granulocytes: 2 %
Lymphocytes Relative: 14 %
Lymphs Abs: 1.9 10*3/uL (ref 0.7–4.0)
MCH: 23.9 pg — ABNORMAL LOW (ref 26.0–34.0)
MCHC: 31.3 g/dL (ref 30.0–36.0)
MCV: 76.4 fL — ABNORMAL LOW (ref 80.0–100.0)
Monocytes Absolute: 0.8 10*3/uL (ref 0.1–1.0)
Monocytes Relative: 6 %
Neutro Abs: 10.4 10*3/uL — ABNORMAL HIGH (ref 1.7–7.7)
Neutrophils Relative %: 78 %
Platelets: 298 10*3/uL (ref 150–400)
RBC: 3.18 MIL/uL — ABNORMAL LOW (ref 4.22–5.81)
RDW: 18.5 % — ABNORMAL HIGH (ref 11.5–15.5)
WBC: 13.4 10*3/uL — ABNORMAL HIGH (ref 4.0–10.5)
nRBC: 0 % (ref 0.0–0.2)

## 2020-07-09 LAB — MAGNESIUM: Magnesium: 1.9 mg/dL (ref 1.7–2.4)

## 2020-07-09 LAB — PHOSPHORUS: Phosphorus: 3.1 mg/dL (ref 2.5–4.6)

## 2020-07-09 LAB — URIC ACID: Uric Acid, Serum: 7.3 mg/dL (ref 3.7–8.6)

## 2020-07-09 NOTE — Progress Notes (Addendum)
PROGRESS NOTE    [name]  VOJ:[id number] DOB: [birth year] DOA: 06/28/2020 PCP: Charlott Rakes, MD     Brief Narrative:  59 year old BM PMHx  chronic gouty arthritis of multiple joint, stage II CKD, insulin-dependent diabetes mellitus, hypertension, hyperlipidemia, anemia of chronic disease, iron deficiency anemia, presents to ED for lethargy and multijoint pain. As per the sister-in-law at bedside patient was unable to stand or walk and was brought to ED.  He was found to be in AKI, hyperkalemic and in rhabdomyolysis. He was started on aggressive hydration and his creatinine has improved from 6.38 to 2.69. orthopedics consulted for his left elbow swelling and elbow joint effusion. Currently pt is on steroids for the gouty flare up of the multiple joints. He was also found to have guiac positive stools. GI consulted, recommended outpatient colonoscopy and EGD .  PT evaluation recommending SNF.   Due to persistent left upper extremity pain,  Ordered venous duplex of the left upper extremity to evaluate for DVT, it was positive for DVT, he was started on IV heparin but it was held for right sided nostril bleeding. ENT consulted, and waas found to have a clot in the right nostril, which was suctioned out.  No need for packing and bleeding resolved. Pt was restarted on heparin and later on transitioned to eliquis.  Recommend atleast another 7 days of prednisone for gout treatment on discharge. We will continue steroids for 2 additional days he was also started on colchicine twice a day.   Subjective: 8/23 A/O x4, negative CP, negative S OB.  States was able to ambulate out to the nurses station today and back   Assessment & Plan: Covid vaccination; vaccinated   Active Problems:   AKI (acute kidney injury) ([place])   Arthralgia   Left elbow pain   Left hand pain   Non-traumatic rhabdomyolysis   Acute on CKD stage II -HCTZ, ACEI, ibuprofen, Lasix held  -Hold ACEI for at least 2  weeks  Lab Results  Component Value Date   CREATININE 1.19 07/09/2020   CREATININE 1.38 (H) 07/08/2020   CREATININE 1.53 (H) 07/07/2020   CREATININE 1.29 (H) 07/05/2020   CREATININE 1.29 (H) 07/04/2020  -Creatinine has returned to normal  Acute hyperkalemia: -No EKG changes.  Improved with hydration.repeat potassium wnl.    Toxic encephalopathy  -Resolved  Acute gouty flareup/ multiple joint pain, / left elbow effusion/ left elbow gout flare up: -4/26 uric acid= 14.1 -8/21 uric acid= 7.3 . Goal= 6 mg/dL -Continue steroids -8/23 Allopurinol 300 mg daily. -8/23 consult to nutrition educate patient on diet to reduce uric acid intake, and thereby reduce gout flares  DM type II uncontrolled with complication -9/93 hemoglobin A1c= 7.1 CBG (last 3)  Recent Labs    07/08/20 2139 07/09/20 0631 07/09/20 1107  GLUCAP 210* 77 150*  -8/23 decrease Lantus 15 units BID -8/23 decrease moderate SSI  Essential hypertension -Well-controlled    Iron deficiency anemia, anemia of chronic disease (baseline HgB ~11 in November 2020) - Gradual decline to 9.7 this admission. -Stool for occult blood is positive. -GI consulted recommended outpatient follow-up for possible EGD or colonoscopy  Lab Results  Component Value Date   HGB 7.7 (L) 07/10/2020   HGB 7.6 (L) 07/09/2020   HGB 8.4 (L) 07/08/2020   HGB 9.9 (L) 07/07/2020   HGB 10.1 (L) 07/05/2020  -Slowly trending down; delusional?  We will reconsult GI before discharge  Elevated lactic acid -Probably secondary to dehydration, normalized with IV fluids.  Nontraumatic rhabdomyolysis with AKI, anion gap metabolic acidosis -Improved with hydration -8/24 CK pending  Fever -Resolved  Mildly elevated liver enzymes  -Resolved.  Left brachial vein DVT of indeterminate age:  -Started him on IV heparin, then started having nose bleeds.  -V heparin was held and ENT consulted for recommendations.  -Transition to  Eliquis  Hypomagnesmia -8/23 magnesium IV 2 g     DVT prophylaxis: Eliquis Code Status:  Family Communication:  Status is: Inpatient    Dispo: The patient is from: Home              Anticipated d/c is to: SNF              Anticipated d/c date is: 8/24              Patient currently unstable      Consultants:    Procedures/Significant Events:    I have personally reviewed and interpreted all radiology studies and my findings are as above.  VENTILATOR SETTINGS:    Cultures   Antimicrobials:    Devices    LINES / TUBES:      Continuous Infusions: . sodium chloride 75 mL/hr at 07/09/20 0319     Objective: Vitals:   07/09/20 0931 07/09/20 0932 07/09/20 0941 07/09/20 1100  BP: 99/65 101/77 101/77 94/71  Pulse: 94 (!) 104 (!) 104   Resp: 15 15    Temp: 98.5 F (36.9 C) 98.5 F (36.9 C)  98.6 F (37 C)  TempSrc: Oral   Oral  SpO2: 100%   99%  Weight:      Height:        Intake/Output Summary (Last 24 hours) at 07/09/2020 1343 Last data filed at 07/09/2020 0935 Gross per 24 hour  Intake 135.69 ml  Output 1350 ml  Net -1214.31 ml   Filed Weights   06/28/20 1623 06/28/20 2116  Weight: 116.6 kg 114.2 kg    Examination:  General: A/O x4, No acute respiratory distress Eyes: negative scleral hemorrhage, negative anisocoria, negative icterus ENT: Negative Runny nose, negative gingival bleeding, Neck:  Negative scars, masses, torticollis, lymphadenopathy, JVD Lungs: Clear to auscultation bilaterally without wheezes or crackles Cardiovascular: Regular rate and rhythm without murmur gallop or rub normal S1 and S2 Abdomen: negative abdominal pain, nondistended, positive soft, bowel sounds, no rebound, no ascites, no appreciable mass Extremities: patient with left hand and wrist swelling extremely tender PCP joints. Skin: Negative rashes, lesions, ulcers Psychiatric:  Negative depression, negative anxiety, negative fatigue, negative mania   Central nervous system:  Cranial nerves II through XII intact, tongue/uvula midline, all extremities muscle strength 5/5, sensation intact throughout, negative dysarthria, negative expressive aphasia, negative receptive aphasia.  .     Data Reviewed: Care during the described time interval was provided by me .  I have reviewed this patient's available data, including medical history, events of note, physical examination, and all test results as part of my evaluation.  CBC: Recent Labs  Lab 07/04/20 0926 07/05/20 0557 07/07/20 1750 07/08/20 0137 07/09/20 0144  WBC 14.4* 16.5* 17.1* 13.5* 13.4*  NEUTROABS  --   --   --   --  10.4*  HGB 10.9* 10.1* 9.9* 8.4* 7.6*  HCT 34.4* 31.1* 31.7* 25.9* 24.3*  MCV 76.6* 76.6* 78.1* 76.0* 76.4*  PLT 322 320 330 276 403   Basic Metabolic Panel: Recent Labs  Lab 07/04/20 0926 07/05/20 0557 07/07/20 1750 07/08/20 0137 07/09/20 0144  NA 140 138 131* 133* 135  K 4.0  4.4 5.8* 4.8 5.0  CL 105 104 99 102 105  CO2 25 25 19* 21* 21*  GLUCOSE 142* 157* 240* 112* 98  BUN 38* 37* 47* 46* 38*  CREATININE 1.29* 1.29* 1.53* 1.38* 1.19  CALCIUM 9.3 9.0 9.3 8.9 8.7*  MG  --   --   --   --  1.9  PHOS  --   --   --   --  3.1   GFR: Estimated Creatinine Clearance: 77.1 mL/min (by C-G formula based on SCr of 1.19 mg/dL). Liver Function Tests: Recent Labs  Lab 07/03/20 0122 07/09/20 0144  AST 45* 24  ALT 45* 27  ALKPHOS 60 53  BILITOT 1.0 1.0  PROT 6.9 6.1*  ALBUMIN 2.5* 2.5*   No results for input(s): LIPASE, AMYLASE in the last 168 hours. No results for input(s): AMMONIA in the last 168 hours. Coagulation Profile: No results for input(s): INR, PROTIME in the last 168 hours. Cardiac Enzymes: Recent Labs  Lab 07/03/20 0122  CKTOTAL 356   BNP (last 3 results) No results for input(s): PROBNP in the last 8760 hours. HbA1C: No results for input(s): HGBA1C in the last 72 hours. CBG: Recent Labs  Lab 07/08/20 1109 07/08/20 1736  07/08/20 2139 07/09/20 0631 07/09/20 1107  GLUCAP 171* 197* 210* 77 150*   Lipid Profile: No results for input(s): CHOL, HDL, LDLCALC, TRIG, CHOLHDL, LDLDIRECT in the last 72 hours. Thyroid Function Tests: No results for input(s): TSH, T4TOTAL, FREET4, T3FREE, THYROIDAB in the last 72 hours. Anemia Panel: No results for input(s): VITAMINB12, FOLATE, FERRITIN, TIBC, IRON, RETICCTPCT in the last 72 hours. Sepsis Labs: No results for input(s): PROCALCITON, LATICACIDVEN in the last 168 hours.  Recent Results (from the past 240 hour(s))  SARS Coronavirus 2 by RT PCR (hospital order, performed in [facility] hospital lab) Nasopharyngeal Nasopharyngeal Swab     Status: None   Collection Time: 07/05/20 11:12 PM   Specimen: Nasopharyngeal Swab  Result Value Ref Range Status   SARS Coronavirus 2 NEGATIVE NEGATIVE Final    Comment: (NOTE) SARS-CoV-2 target nucleic acids are NOT DETECTED.  The SARS-CoV-2 RNA is generally detectable in upper and lower respiratory specimens during the acute phase of infection. The lowest concentration of SARS-CoV-2 viral copies this assay can detect is 250 copies / mL. A negative result does not preclude SARS-CoV-2 infection and should not be used as the sole basis for treatment or other patient management decisions.  A negative result may occur with improper specimen collection / handling, submission of specimen other than nasopharyngeal swab, presence of viral mutation(s) within the areas targeted by this assay, and inadequate number of viral copies (<250 copies / mL). A negative result must be combined with clinical observations, patient history, and epidemiological information.  Fact Sheet for Patients:   StrictlyIdeas.no  Fact Sheet for Healthcare Providers: BankingDealers.co.za  This test is not yet approved or  cleared by the [place] FDA and has been authorized for detection and/or diagnosis of  SARS-CoV-2 by FDA under an Emergency Use Authorization (EUA).  This EUA will remain in effect (meaning this test can be used) for the duration of the COVID-19 declaration under Section 564(b)(1) of the Act, 21 U.S.C. section 360bbb-3(b)(1), unless the authorization is terminated or revoked sooner.  Performed at [facility], [place] [address], [place], [place] [address]          Radiology Studies: No results found.      Scheduled Meds: . apixaban  10  mg Oral BID   Followed by  . [START ON 07/12/2020] apixaban  5 mg Oral BID  . aspirin EC  81 mg Oral Daily  . Chlorhexidine Gluconate Cloth  6 each Topical Daily  . colchicine  0.6 mg Oral BID  . cyclobenzaprine  10 mg Oral TID  . ferrous sulfate  325 mg Oral Q breakfast  . insulin aspart  0-20 Units Subcutaneous TID WC  . insulin aspart  0-5 Units Subcutaneous QHS  . insulin glargine  22 Units Subcutaneous BID  . loratadine  10 mg Oral Daily  . metFORMIN  1,000 mg Oral BID WC  . metoprolol succinate  50 mg Oral Daily  . pantoprazole  40 mg Oral QHS  . traZODone  50 mg Oral QHS   Continuous Infusions: . sodium chloride 75 mL/hr at 07/09/20 0319     LOS: 11 days    Time spent:40 min    Calab Sachse, Geraldo Docker, MD [facility] Pager [phone]  If 7PM-7AM, please contact night-coverage [email or web address] Password [facility] 07/09/2020, 1:43 PM

## 2020-07-10 LAB — CBC WITH DIFFERENTIAL/PLATELET
Abs Immature Granulocytes: 0.19 10*3/uL — ABNORMAL HIGH (ref 0.00–0.07)
Basophils Absolute: 0 10*3/uL (ref 0.0–0.1)
Basophils Relative: 0 %
Eosinophils Absolute: 0 10*3/uL (ref 0.0–0.5)
Eosinophils Relative: 0 %
HCT: 24.8 % — ABNORMAL LOW (ref 39.0–52.0)
Hemoglobin: 7.7 g/dL — ABNORMAL LOW (ref 13.0–17.0)
Immature Granulocytes: 1 %
Lymphocytes Relative: 11 %
Lymphs Abs: 1.5 10*3/uL (ref 0.7–4.0)
MCH: 24.3 pg — ABNORMAL LOW (ref 26.0–34.0)
MCHC: 31 g/dL (ref 30.0–36.0)
MCV: 78.2 fL — ABNORMAL LOW (ref 80.0–100.0)
Monocytes Absolute: 0.9 10*3/uL (ref 0.1–1.0)
Monocytes Relative: 6 %
Neutro Abs: 11.1 10*3/uL — ABNORMAL HIGH (ref 1.7–7.7)
Neutrophils Relative %: 82 %
Platelets: 272 10*3/uL (ref 150–400)
RBC: 3.17 MIL/uL — ABNORMAL LOW (ref 4.22–5.81)
RDW: 19 % — ABNORMAL HIGH (ref 11.5–15.5)
WBC: 13.7 10*3/uL — ABNORMAL HIGH (ref 4.0–10.5)
nRBC: 0 % (ref 0.0–0.2)

## 2020-07-10 LAB — GLUCOSE, CAPILLARY
Glucose-Capillary: 103 mg/dL — ABNORMAL HIGH (ref 70–99)
Glucose-Capillary: 109 mg/dL — ABNORMAL HIGH (ref 70–99)
Glucose-Capillary: 129 mg/dL — ABNORMAL HIGH (ref 70–99)
Glucose-Capillary: 129 mg/dL — ABNORMAL HIGH (ref 70–99)
Glucose-Capillary: 53 mg/dL — ABNORMAL LOW (ref 70–99)
Glucose-Capillary: 66 mg/dL — ABNORMAL LOW (ref 70–99)
Glucose-Capillary: 81 mg/dL (ref 70–99)

## 2020-07-10 LAB — PHOSPHORUS: Phosphorus: 2.9 mg/dL (ref 2.5–4.6)

## 2020-07-10 LAB — MAGNESIUM: Magnesium: 1.6 mg/dL — ABNORMAL LOW (ref 1.7–2.4)

## 2020-07-10 LAB — SARS CORONAVIRUS 2 (TAT 6-24 HRS): SARS Coronavirus 2: NEGATIVE

## 2020-07-10 MED ORDER — FENTANYL CITRATE (PF) 250 MCG/5ML IJ SOLN
INTRAMUSCULAR | Status: AC
  Filled 2020-07-10: qty 5

## 2020-07-10 MED ORDER — LIDOCAINE 2% (20 MG/ML) 5 ML SYRINGE
INTRAMUSCULAR | Status: AC
  Filled 2020-07-10: qty 5

## 2020-07-10 MED ORDER — INSULIN GLARGINE 100 UNIT/ML ~~LOC~~ SOLN
15.0000 [IU] | Freq: Two times a day (BID) | SUBCUTANEOUS | Status: DC
  Administered 2020-07-10 – 2020-07-11 (×2): 15 [IU] via SUBCUTANEOUS
  Filled 2020-07-10 (×3): qty 0.15

## 2020-07-10 MED ORDER — MAGNESIUM SULFATE 2 GM/50ML IV SOLN
2.0000 g | Freq: Once | INTRAVENOUS | Status: AC
  Administered 2020-07-10: 2 g via INTRAVENOUS
  Filled 2020-07-10: qty 50

## 2020-07-10 MED ORDER — SUCCINYLCHOLINE CHLORIDE 200 MG/10ML IV SOSY
PREFILLED_SYRINGE | INTRAVENOUS | Status: AC
  Filled 2020-07-10: qty 10

## 2020-07-10 MED ORDER — ALLOPURINOL 300 MG PO TABS
300.0000 mg | ORAL_TABLET | Freq: Every day | ORAL | Status: DC
  Administered 2020-07-10 – 2020-07-11 (×2): 300 mg via ORAL
  Filled 2020-07-10 (×2): qty 1

## 2020-07-10 MED ORDER — INSULIN ASPART 100 UNIT/ML ~~LOC~~ SOLN: 0.0000 [IU] | SUBCUTANEOUS | Status: DC

## 2020-07-10 MED ORDER — MIDAZOLAM HCL 2 MG/2ML IJ SOLN
INTRAMUSCULAR | Status: AC
  Filled 2020-07-10: qty 2

## 2020-07-10 MED ORDER — PROPOFOL 10 MG/ML IV BOLUS
INTRAVENOUS | Status: AC
  Filled 2020-07-10: qty 20

## 2020-07-10 MED ORDER — ROCURONIUM BROMIDE 10 MG/ML (PF) SYRINGE
PREFILLED_SYRINGE | INTRAVENOUS | Status: AC
  Filled 2020-07-10: qty 10

## 2020-07-10 NOTE — Progress Notes (Signed)
PROGRESS NOTE    EINAR NOLASCO  YBO:[id number] DOB: 12/01/1960 DOA: 06/28/2020 PCP: Charlott Rakes, MD     Brief Narrative:  59 year old BM PMHx  chronic gouty arthritis of multiple joint, stage II CKD, insulin-dependent diabetes mellitus, hypertension, hyperlipidemia, anemia of chronic disease, iron deficiency anemia, presents to ED for lethargy and multijoint pain. As per the sister-in-law at bedside patient was unable to stand or walk and was brought to ED.  He was found to be in AKI, hyperkalemic and in rhabdomyolysis. He was started on aggressive hydration and his creatinine has improved from 6.38 to 2.69. orthopedics consulted for his left elbow swelling and elbow joint effusion. Currently pt is on steroids for the gouty flare up of the multiple joints. He was also found to have guiac positive stools. GI consulted, recommended outpatient colonoscopy and EGD .  PT evaluation recommending SNF.   Due to persistent left upper extremity pain,  Ordered venous duplex of the left upper extremity to evaluate for DVT, it was positive for DVT, he was started on IV heparin but it was held for right sided nostril bleeding. ENT consulted, and waas found to have a clot in the right nostril, which was suctioned out.  No need for packing and bleeding resolved. Pt was restarted on heparin and later on transitioned to eliquis.  Recommend atleast another 7 days of prednisone for gout treatment on discharge. We will continue steroids for 2 additional days he was also started on colchicine twice a day.   Subjective: 8/23 A/O x4, negative CP, negative S OB.  States was able to ambulate out to the nurses station today and back   Assessment & Plan: Covid vaccination; vaccinated   Active Problems:   AKI (acute kidney injury) ([place])   Arthralgia   Left elbow pain   Left hand pain   Non-traumatic rhabdomyolysis   Acute on CKD stage II -HCTZ, ACEI, ibuprofen, Lasix held  -Hold ACEI for at least 2  weeks  Lab Results  Component Value Date   CREATININE 1.19 07/09/2020   CREATININE 1.38 (H) 07/08/2020   CREATININE 1.53 (H) 07/07/2020   CREATININE 1.29 (H) 07/05/2020   CREATININE 1.29 (H) 07/04/2020  -Creatinine has returned to normal  Acute hyperkalemia: -No EKG changes.  Improved with hydration.repeat potassium wnl.    Toxic encephalopathy  -Resolved  Acute gouty flareup/ multiple joint pain, / left elbow effusion/ left elbow gout flare up: -4/26 uric acid= 14.1 -8/21 uric acid= 7.3 . Goal= 6 mg/dL -Continue steroids -8/23 Allopurinol 300 mg daily. -8/23 consult to nutrition educate patient on diet to reduce uric acid intake, and thereby reduce gout flares  DM type II uncontrolled with complication -2/77 hemoglobin A1c= 7.1 CBG (last 3)  Recent Labs    07/10/20 0653 07/10/20 1109 07/10/20 1610  GLUCAP 81 129* 129*  -8/23 decrease Lantus 15 units BID -8/23 decrease moderate SSI  Essential hypertension -Well-controlled    Iron deficiency anemia, anemia of chronic disease (baseline HgB ~11 in November 2020) - Gradual decline to 9.7 this admission. -Stool for occult blood is positive. -GI consulted recommended outpatient follow-up for possible EGD or colonoscopy  Lab Results  Component Value Date   HGB 7.7 (L) 07/10/2020   HGB 7.6 (L) 07/09/2020   HGB 8.4 (L) 07/08/2020   HGB 9.9 (L) 07/07/2020   HGB 10.1 (L) 07/05/2020  -Slowly trending down; delusional?  We will reconsult GI before discharge  Elevated lactic acid -Probably secondary to dehydration, normalized with IV fluids.  Nontraumatic rhabdomyolysis with AKI, anion gap metabolic acidosis -Improved with hydration -8/24 CK pending  Fever -Resolved  Mildly elevated liver enzymes  -Resolved.  Left brachial vein DVT of indeterminate age:  -Started him on IV heparin, then started having nose bleeds.  -V heparin was held and ENT consulted for recommendations.  -Transition to  Eliquis  Hypomagnesmia -8/23 magnesium IV 2 g     DVT prophylaxis: Eliquis Code Status:  Family Communication:  Status is: Inpatient    Dispo: The patient is from: Home              Anticipated d/c is to: SNF              Anticipated d/c date is: 8/24              Patient currently unstable      Consultants:    Procedures/Significant Events:    I have personally reviewed and interpreted all radiology studies and my findings are as above.  VENTILATOR SETTINGS:    Cultures   Antimicrobials:    Devices    LINES / TUBES:      Continuous Infusions: . magnesium sulfate bolus IVPB       Objective: Vitals:   07/10/20 0516 07/10/20 0806 07/10/20 1328 07/10/20 1510  BP: 116/77 96/60 113/72 103/86  Pulse: 80 98 (!) 107 (!) 106  Resp: 13 15 17 18   Temp: 98.4 F (36.9 C) 98.4 F (36.9 C) (!) 97.5 F (36.4 C) 98.3 F (36.8 C)  TempSrc: Oral Oral Oral Oral  SpO2: 97% 99% 99% 98%  Weight:      Height:        Intake/Output Summary (Last 24 hours) at 07/10/2020 1837 Last data filed at 07/10/2020 7425 Gross per 24 hour  Intake --  Output 1500 ml  Net -1500 ml   Filed Weights   06/28/20 1623 06/28/20 2116  Weight: 116.6 kg 114.2 kg    Examination:  General: A/O x4, No acute respiratory distress Eyes: negative scleral hemorrhage, negative anisocoria, negative icterus ENT: Negative Runny nose, negative gingival bleeding, Neck:  Negative scars, masses, torticollis, lymphadenopathy, JVD Lungs: Clear to auscultation bilaterally without wheezes or crackles Cardiovascular: Regular rate and rhythm without murmur gallop or rub normal S1 and S2 Abdomen: negative abdominal pain, nondistended, positive soft, bowel sounds, no rebound, no ascites, no appreciable mass Extremities: patient with left hand and wrist swelling extremely tender PCP joints. Skin: Negative rashes, lesions, ulcers Psychiatric:  Negative depression, negative anxiety, negative fatigue,  negative mania  Central nervous system:  Cranial nerves II through XII intact, tongue/uvula midline, all extremities muscle strength 5/5, sensation intact throughout, negative dysarthria, negative expressive aphasia, negative receptive aphasia.  .     Data Reviewed: Care during the described time interval was provided by me .  I have reviewed this patient's available data, including medical history, events of note, physical examination, and all test results as part of my evaluation.  CBC: Recent Labs  Lab 07/05/20 0557 07/07/20 1750 07/08/20 0137 07/09/20 0144 07/10/20 0712  WBC 16.5* 17.1* 13.5* 13.4* 13.7*  NEUTROABS  --   --   --  10.4* 11.1*  HGB 10.1* 9.9* 8.4* 7.6* 7.7*  HCT 31.1* 31.7* 25.9* 24.3* 24.8*  MCV 76.6* 78.1* 76.0* 76.4* 78.2*  PLT 320 330 276 298 956   Basic Metabolic Panel: Recent Labs  Lab 07/04/20 0926 07/05/20 0557 07/07/20 1750 07/08/20 0137 07/09/20 0144 07/10/20 0712  NA 140 138 131* 133* 135  --  K 4.0 4.4 5.8* 4.8 5.0  --   CL 105 104 99 102 105  --   CO2 25 25 19* 21* 21*  --   GLUCOSE 142* 157* 240* 112* 98  --   BUN 38* 37* 47* 46* 38*  --   CREATININE 1.29* 1.29* 1.53* 1.38* 1.19  --   CALCIUM 9.3 9.0 9.3 8.9 8.7*  --   MG  --   --   --   --  1.9 1.6*  PHOS  --   --   --   --  3.1 2.9   GFR: Estimated Creatinine Clearance: 77.1 mL/min (by C-G formula based on SCr of 1.19 mg/dL). Liver Function Tests: Recent Labs  Lab 07/09/20 0144  AST 24  ALT 27  ALKPHOS 53  BILITOT 1.0  PROT 6.1*  ALBUMIN 2.5*   No results for input(s): LIPASE, AMYLASE in the last 168 hours. No results for input(s): AMMONIA in the last 168 hours. Coagulation Profile: No results for input(s): INR, PROTIME in the last 168 hours. Cardiac Enzymes: No results for input(s): CKTOTAL, CKMB, CKMBINDEX, TROPONINI in the last 168 hours. BNP (last 3 results) No results for input(s): PROBNP in the last 8760 hours. HbA1C: No results for input(s): HGBA1C in the  last 72 hours. CBG: Recent Labs  Lab 07/10/20 0608 07/10/20 0633 07/10/20 0653 07/10/20 1109 07/10/20 1610  GLUCAP 53* 66* 81 129* 129*   Lipid Profile: No results for input(s): CHOL, HDL, LDLCALC, TRIG, CHOLHDL, LDLDIRECT in the last 72 hours. Thyroid Function Tests: No results for input(s): TSH, T4TOTAL, FREET4, T3FREE, THYROIDAB in the last 72 hours. Anemia Panel: No results for input(s): VITAMINB12, FOLATE, FERRITIN, TIBC, IRON, RETICCTPCT in the last 72 hours. Sepsis Labs: No results for input(s): PROCALCITON, LATICACIDVEN in the last 168 hours.  Recent Results (from the past 240 hour(s))  SARS Coronavirus 2 by RT PCR (hospital order, performed in [facility] hospital lab) Nasopharyngeal Nasopharyngeal Swab     Status: None   Collection Time: 07/05/20 11:12 PM   Specimen: Nasopharyngeal Swab  Result Value Ref Range Status   SARS Coronavirus 2 NEGATIVE NEGATIVE Final    Comment: (NOTE) SARS-CoV-2 target nucleic acids are NOT DETECTED.  The SARS-CoV-2 RNA is generally detectable in upper and lower respiratory specimens during the acute phase of infection. The lowest concentration of SARS-CoV-2 viral copies this assay can detect is 250 copies / mL. A negative result does not preclude SARS-CoV-2 infection and should not be used as the sole basis for treatment or other patient management decisions.  A negative result may occur with improper specimen collection / handling, submission of specimen other than nasopharyngeal swab, presence of viral mutation(s) within the areas targeted by this assay, and inadequate number of viral copies (<250 copies / mL). A negative result must be combined with clinical observations, patient history, and epidemiological information.  Fact Sheet for Patients:   StrictlyIdeas.no  Fact Sheet for Healthcare Providers: BankingDealers.co.za  This test is not yet approved or  cleared by the [place] FDA and has been authorized for detection and/or diagnosis of SARS-CoV-2 by FDA under an Emergency Use Authorization (EUA).  This EUA will remain in effect (meaning this test can be used) for the duration of the COVID-19 declaration under Section 564(b)(1) of the Act, 21 U.S.C. section 360bbb-3(b)(1), unless the authorization is terminated or revoked sooner.  Performed at [facility], [place] [address], [place], [place] [address]   SARS CORONAVIRUS 2 (TAT  6-24 HRS) Nasopharyngeal Nasopharyngeal Swab     Status: None   Collection Time: 07/10/20 10:38 AM   Specimen: Nasopharyngeal Swab  Result Value Ref Range Status   SARS Coronavirus 2 NEGATIVE NEGATIVE Final    Comment: (NOTE) SARS-CoV-2 target nucleic acids are NOT DETECTED.  The SARS-CoV-2 RNA is generally detectable in upper and lower respiratory specimens during the acute phase of infection. Negative results do not preclude SARS-CoV-2 infection, do not rule out co-infections with other pathogens, and should not be used as the sole basis for treatment or other patient management decisions. Negative results must be combined with clinical observations, patient history, and epidemiological information. The expected result is Negative.  Fact Sheet for Patients: SugarRoll.be  Fact Sheet for Healthcare Providers: https://www.Rushil Kimbrell-mathews.com/  This test is not yet approved or cleared by the [place] FDA and  has been authorized for detection and/or diagnosis of SARS-CoV-2 by FDA under an Emergency Use Authorization (EUA). This EUA will remain  in effect (meaning this test can be used) for the duration of the COVID-19 declaration under Se ction 564(b)(1) of the Act, 21 U.S.C. section 360bbb-3(b)(1), unless the authorization is terminated or revoked sooner.  Performed at [facility], [place] [address] [place],  [address]          Radiology Studies: No  results found.      Scheduled Meds: . allopurinol  300 mg Oral Daily  . apixaban  10 mg Oral BID   Followed by  . [START ON 07/12/2020] apixaban  5 mg Oral BID  . aspirin EC  81 mg Oral Daily  . Chlorhexidine Gluconate Cloth  6 each Topical Daily  . colchicine  0.6 mg Oral BID  . cyclobenzaprine  10 mg Oral TID  . ferrous sulfate  325 mg Oral Q breakfast  . insulin aspart  0-15 Units Subcutaneous Q4H  . insulin aspart  0-5 Units Subcutaneous QHS  . insulin glargine  15 Units Subcutaneous BID  . loratadine  10 mg Oral Daily  . metFORMIN  1,000 mg Oral BID WC  . metoprolol succinate  50 mg Oral Daily  . pantoprazole  40 mg Oral QHS  . traZODone  50 mg Oral QHS   Continuous Infusions: . magnesium sulfate bolus IVPB       LOS: 12 days    Time spent:40 min    Janay Canan, Geraldo Docker, MD [facility] Pager [phone]  If 7PM-7AM, please contact night-coverage [email or web address] Password [facility] 07/10/2020, 6:37 PM

## 2020-07-10 NOTE — Progress Notes (Signed)
Physical Therapy Treatment Patient Details Name: [name] MRN: [id number] DOB: 1961-07-24 Today's Date: 07/10/2020    History of Present Illness Pt is 59 y.o. male presenting with change in mentation and worsening of multiple joint pain. Patient admitted with AKI on CKD II likely ATN, acute hyperkalemia, toxic encephalopathy, and acute gouty flare-up. On 07/03/20 around 1530 pt found to have age indeterminate DVT in L UE.  PMHx significant for chronic gouty arthritis of multiple joints, CKD II, IDDM, HTN, HLD, and chronic anemia. 8/17 pt with finding consistent with age indeterminate DVT involving left brachial veins.    PT Comments    Patient progressing well towards PT goals. Tolerated gait training today with Min A and use of RW for balance/safety. Requires frequent seated rest breaks due to DOE and elevated HR/RR. HR up to 155 bpm and RR in the 40s with activity. Continues to demonstrate weakness in BLEs, impaired endurance and decreased activity tolerance but highly motivated to improve and return to PLOF. Impulsive at times putting pt at increased risk for falls. Continue to recommend SNF. Will follow.    Follow Up Recommendations  SNF     Equipment Recommendations  None recommended by PT    Recommendations for Other Services       Precautions / Restrictions Precautions Precautions: Fall Precaution Comments: watch HR Restrictions Weight Bearing Restrictions: No    Mobility  Bed Mobility               General bed mobility comments: Up in chair upon PT arrival.  Transfers Overall transfer level: Needs assistance Equipment used: Rolling walker (2 wheeled) Transfers: Sit to/from Stand Sit to Stand: Min guard         General transfer comment: Min guard to stand from chair x5; impulsive with movement; "let me get my feet right," trying to decreased BoS.  Ambulation/Gait Ambulation/Gait assistance: Min assist;+2 safety/equipment Gait Distance (Feet): 20 Feet  (+30' +28') Assistive device: Rolling walker (2 wheeled) Gait Pattern/deviations: Step-through pattern;Trunk flexed;Wide base of support Gait velocity: varying speeds   General Gait Details: varying speeds noted (slow to unsafe fast speed), talking to self as motivation; cues for upright posture and to decrease speed. 3 seated rest breaks. HR up to 155 bpm. RR up in 40s.   Stairs             Wheelchair Mobility    Modified Rankin (Stroke Patients Only)       Balance Overall balance assessment: Needs assistance Sitting-balance support: Feet supported;No upper extremity supported Sitting balance-Leahy Scale: Good     Standing balance support: During functional activity Standing balance-Leahy Scale: Poor Standing balance comment: Requires UE support.                            Cognition Arousal/Alertness: Awake/alert Behavior During Therapy: Impulsive Overall Cognitive Status: No family/caregiver present to determine baseline cognitive functioning                                 General Comments: internally distrated at times, benefits from repetition of info. Poor awareness of safety. "I don't know how long I have been here."      Exercises General Exercises - Lower Extremity Ankle Circles/Pumps: Both;10 reps;Supine;AROM Quad Sets: AROM;Both;10 reps;Supine Long Arc Quad: Both;10 reps;Seated;AROM    General Comments        Pertinent Vitals/Pain Pain Assessment:  No/denies pain    Home Living                      Prior Function            PT Goals (current goals can now be found in the care plan section) Progress towards PT goals: Progressing toward goals    Frequency    Min 2X/week      PT Plan Current plan remains appropriate    Co-evaluation              AM-PAC PT "6 Clicks" Mobility   Outcome Measure  Help needed turning from your back to your side while in a flat bed without using bedrails?: A  Little Help needed moving from lying on your back to sitting on the side of a flat bed without using bedrails?: A Little Help needed moving to and from a bed to a chair (including a wheelchair)?: A Little Help needed standing up from a chair using your arms (e.g., wheelchair or bedside chair)?: A Little Help needed to walk in hospital room?: A Little Help needed climbing 3-5 steps with a railing? : A Lot 6 Click Score: 17    End of Session Equipment Utilized During Treatment: Gait belt Activity Tolerance: Patient tolerated treatment well Patient left: in chair;with call bell/phone within reach Nurse Communication: Mobility status PT Visit Diagnosis: Unsteadiness on feet (R26.81);Muscle weakness (generalized) (M62.81);Difficulty in walking, not elsewhere classified (R26.2)     Time: 6644-0347 PT Time Calculation (min) (ACUTE ONLY): 28 min  Charges:  $Gait Training: 23-37 mins                     Marisa Severin, PT, DPT Acute Rehabilitation Services Pager [phone] Office [place] 07/10/2020, 12:58 PM

## 2020-07-10 NOTE — Progress Notes (Signed)
Pt's blood sugar was 135 at 2200, sliding scale Novolog and Lantus were held due to patient's history of having low blood sugar in the AM. This morning Pt's blood sugar is 53. Apple is given. We'll recheck the Blood sugar.

## 2020-07-10 NOTE — TOC Progression Note (Signed)
Transition of Care [facility]) - Progression Note    Patient Details  Name: [name] MRN: [id number] Date of Birth: [birth year]  Transition of Care [facility]) CM/SW [place], [place] Phone Number: 07/10/2020, 10:32 AM  Clinical Narrative:     [facility]/SNF informed CSW they have received SNF authorization- patient will need covid test results before discharging to SNF.   MD updated.  Thurmond Butts, MSW, LCSWA Clinical Social Worker   Expected Discharge Plan: Home w Home Health Services Barriers to Discharge: Ship broker, Continued Medical Work up  Expected Discharge Plan and Services Expected Discharge Plan: [place] In-house Referral: Clinical Social Work                                             Social Determinants of Health (SDOH) Interventions    Readmission Risk Interventions No flowsheet data found.

## 2020-07-10 NOTE — Progress Notes (Signed)
Patient's Blood sugar is rechecked and it is 66, we'll give another orange juice and recheck in 15 mns

## 2020-07-10 NOTE — Progress Notes (Addendum)
Inpatient Diabetes Program Recommendations  AACE/ADA: New Consensus Statement on Inpatient Glycemic Control (2015)  Target Ranges:  Prepandial:   less than 140 mg/dL      Peak postprandial:   less than 180 mg/dL (1-2 hours)      Critically ill patients:  140 - 180 mg/dL   Lab Results  Component Value Date   GLUCAP 129 (H) 07/10/2020   HGBA1C 7.1 (H) 06/28/2020    Review of Glycemic Control Results for ONOFRE, GAINS (MRN [id number]) as of 07/10/2020 11:23  Ref. Range 07/10/2020 06:08 07/10/2020 06:33 07/10/2020 06:53 07/10/2020 11:09  Glucose-Capillary Latest Ref Range: 70 - 99 mg/dL 53 (L) 66 (L) 81 129 (H)   Diabetes history: Type 2 DM Outpatient Diabetes medications: Victoza 1.2 mg QAM, Novolog 70/30 50 units BID Current orders for Inpatient glycemic control: Metformin 1000 mg BID, Lantus 22 units BID, Novolog 0-20 units TID, Novolog 0-5 units QHS  Inpatient Diabetes Program Recommendations:    Noted hypoglycemia this AM of 53 mg/dL. Patient had also missed QHS dose of Lantus and Prednisone completed. Consider decreasing Lantus 10 units BID.   Thanks, Bronson Curb, MSN, RNC-OB Diabetes Coordinator [phone] (8a-5p)

## 2020-07-10 NOTE — Progress Notes (Signed)
Pt bs is now 81 after the orange juice was given.

## 2020-07-11 ENCOUNTER — Non-Acute Institutional Stay (SKILLED_NURSING_FACILITY): Payer: Medicare HMO | Admitting: Internal Medicine

## 2020-07-11 ENCOUNTER — Encounter: Payer: Self-pay | Admitting: Internal Medicine

## 2020-07-11 DIAGNOSIS — E1140 Type 2 diabetes mellitus with diabetic neuropathy, unspecified: Secondary | ICD-10-CM | POA: Diagnosis not present

## 2020-07-11 DIAGNOSIS — M6282 Rhabdomyolysis: Secondary | ICD-10-CM | POA: Diagnosis not present

## 2020-07-11 DIAGNOSIS — M10041 Idiopathic gout, right hand: Secondary | ICD-10-CM | POA: Diagnosis not present

## 2020-07-11 DIAGNOSIS — K5901 Slow transit constipation: Secondary | ICD-10-CM | POA: Diagnosis not present

## 2020-07-11 DIAGNOSIS — R29818 Other symptoms and signs involving the nervous system: Secondary | ICD-10-CM | POA: Diagnosis not present

## 2020-07-11 DIAGNOSIS — N1831 Chronic kidney disease, stage 3a: Secondary | ICD-10-CM | POA: Diagnosis not present

## 2020-07-11 DIAGNOSIS — M25522 Pain in left elbow: Secondary | ICD-10-CM | POA: Diagnosis not present

## 2020-07-11 DIAGNOSIS — E1169 Type 2 diabetes mellitus with other specified complication: Secondary | ICD-10-CM | POA: Diagnosis not present

## 2020-07-11 DIAGNOSIS — N179 Acute kidney failure, unspecified: Secondary | ICD-10-CM | POA: Diagnosis not present

## 2020-07-11 DIAGNOSIS — M25422 Effusion, left elbow: Secondary | ICD-10-CM | POA: Diagnosis not present

## 2020-07-11 DIAGNOSIS — R000 Tachycardia, unspecified: Secondary | ICD-10-CM

## 2020-07-11 DIAGNOSIS — R609 Edema, unspecified: Secondary | ICD-10-CM | POA: Diagnosis not present

## 2020-07-11 DIAGNOSIS — M2550 Pain in unspecified joint: Secondary | ICD-10-CM | POA: Diagnosis not present

## 2020-07-11 DIAGNOSIS — N182 Chronic kidney disease, stage 2 (mild): Secondary | ICD-10-CM | POA: Diagnosis not present

## 2020-07-11 DIAGNOSIS — R4189 Other symptoms and signs involving cognitive functions and awareness: Secondary | ICD-10-CM

## 2020-07-11 DIAGNOSIS — M6281 Muscle weakness (generalized): Secondary | ICD-10-CM | POA: Diagnosis not present

## 2020-07-11 DIAGNOSIS — M79642 Pain in left hand: Secondary | ICD-10-CM | POA: Diagnosis not present

## 2020-07-11 DIAGNOSIS — F5101 Primary insomnia: Secondary | ICD-10-CM | POA: Diagnosis not present

## 2020-07-11 DIAGNOSIS — I129 Hypertensive chronic kidney disease with stage 1 through stage 4 chronic kidney disease, or unspecified chronic kidney disease: Secondary | ICD-10-CM | POA: Diagnosis not present

## 2020-07-11 DIAGNOSIS — R41841 Cognitive communication deficit: Secondary | ICD-10-CM | POA: Diagnosis not present

## 2020-07-11 DIAGNOSIS — I82622 Acute embolism and thrombosis of deep veins of left upper extremity: Secondary | ICD-10-CM | POA: Diagnosis not present

## 2020-07-11 DIAGNOSIS — L089 Local infection of the skin and subcutaneous tissue, unspecified: Secondary | ICD-10-CM | POA: Diagnosis not present

## 2020-07-11 DIAGNOSIS — D509 Iron deficiency anemia, unspecified: Secondary | ICD-10-CM | POA: Diagnosis not present

## 2020-07-11 DIAGNOSIS — K3184 Gastroparesis: Secondary | ICD-10-CM | POA: Diagnosis not present

## 2020-07-11 DIAGNOSIS — E1149 Type 2 diabetes mellitus with other diabetic neurological complication: Secondary | ICD-10-CM | POA: Diagnosis not present

## 2020-07-11 DIAGNOSIS — R112 Nausea with vomiting, unspecified: Secondary | ICD-10-CM | POA: Diagnosis not present

## 2020-07-11 DIAGNOSIS — R531 Weakness: Secondary | ICD-10-CM | POA: Diagnosis not present

## 2020-07-11 DIAGNOSIS — Z7401 Bed confinement status: Secondary | ICD-10-CM | POA: Diagnosis not present

## 2020-07-11 DIAGNOSIS — R52 Pain, unspecified: Secondary | ICD-10-CM | POA: Diagnosis not present

## 2020-07-11 DIAGNOSIS — N170 Acute kidney failure with tubular necrosis: Secondary | ICD-10-CM | POA: Diagnosis not present

## 2020-07-11 DIAGNOSIS — M10022 Idiopathic gout, left elbow: Secondary | ICD-10-CM | POA: Diagnosis not present

## 2020-07-11 DIAGNOSIS — M109 Gout, unspecified: Secondary | ICD-10-CM | POA: Diagnosis not present

## 2020-07-11 LAB — COMPREHENSIVE METABOLIC PANEL
ALT: 25 U/L (ref 0–44)
AST: 20 U/L (ref 15–41)
Albumin: 2.6 g/dL — ABNORMAL LOW (ref 3.5–5.0)
Alkaline Phosphatase: 60 U/L (ref 38–126)
Anion gap: 11 (ref 5–15)
BUN: 19 mg/dL (ref 6–20)
CO2: 20 mmol/L — ABNORMAL LOW (ref 22–32)
Calcium: 8.9 mg/dL (ref 8.9–10.3)
Chloride: 102 mmol/L (ref 98–111)
Creatinine, Ser: 1.07 mg/dL (ref 0.61–1.24)
GFR calc Af Amer: >60 mL/min (ref >60)
GFR calc non Af Amer: >60 mL/min (ref >60)
Glucose, Bld: 91 mg/dL (ref 70–99)
Potassium: 4.9 mmol/L (ref 3.5–5.1)
Sodium: 133 mmol/L — ABNORMAL LOW (ref 135–145)
Total Bilirubin: 1.2 mg/dL (ref 0.3–1.2)
Total Protein: 6.1 g/dL — ABNORMAL LOW (ref 6.5–8.1)

## 2020-07-11 LAB — CBC WITH DIFFERENTIAL/PLATELET
Abs Immature Granulocytes: 0.14 10*3/uL — ABNORMAL HIGH (ref 0.00–0.07)
Basophils Absolute: 0 10*3/uL (ref 0.0–0.1)
Basophils Relative: 0 %
Eosinophils Absolute: 0.1 10*3/uL (ref 0.0–0.5)
Eosinophils Relative: 0 %
HCT: 24.7 % — ABNORMAL LOW (ref 39.0–52.0)
Hemoglobin: 7.7 g/dL — ABNORMAL LOW (ref 13.0–17.0)
Immature Granulocytes: 1 %
Lymphocytes Relative: 12 %
Lymphs Abs: 1.5 10*3/uL (ref 0.7–4.0)
MCH: 23.9 pg — ABNORMAL LOW (ref 26.0–34.0)
MCHC: 31.2 g/dL (ref 30.0–36.0)
MCV: 76.7 fL — ABNORMAL LOW (ref 80.0–100.0)
Monocytes Absolute: 0.9 10*3/uL (ref 0.1–1.0)
Monocytes Relative: 8 %
Neutro Abs: 9.7 10*3/uL — ABNORMAL HIGH (ref 1.7–7.7)
Neutrophils Relative %: 79 %
Platelets: 262 10*3/uL (ref 150–400)
RBC: 3.22 MIL/uL — ABNORMAL LOW (ref 4.22–5.81)
RDW: 18.7 % — ABNORMAL HIGH (ref 11.5–15.5)
WBC: 12.4 10*3/uL — ABNORMAL HIGH (ref 4.0–10.5)
nRBC: 0 % (ref 0.0–0.2)

## 2020-07-11 LAB — GLUCOSE, CAPILLARY
Glucose-Capillary: 98 mg/dL (ref 70–99)
Glucose-Capillary: 92 mg/dL (ref 70–99)

## 2020-07-11 LAB — MAGNESIUM: Magnesium: 2 mg/dL (ref 1.7–2.4)

## 2020-07-11 LAB — PHOSPHORUS: Phosphorus: 2.8 mg/dL (ref 2.5–4.6)

## 2020-07-11 LAB — CK: Total CK: 93 U/L (ref 49–397)

## 2020-07-11 MED ORDER — PANTOPRAZOLE SODIUM 40 MG PO TBEC
40.0000 mg | DELAYED_RELEASE_TABLET | Freq: Every day | ORAL | 0 refills | Status: DC
Start: 2020-07-11 — End: 2020-08-01

## 2020-07-11 MED ORDER — INSULIN GLARGINE 100 UNITS/ML SOLOSTAR PEN: 15.0000 [IU] | PEN_INJECTOR | Freq: Two times a day (BID) | SUBCUTANEOUS | 0 refills | Status: DC

## 2020-07-11 MED ORDER — COLCHICINE 0.6 MG PO TABS
0.6000 mg | ORAL_TABLET | Freq: Two times a day (BID) | ORAL | 0 refills | Status: DC
Start: 2020-07-11 — End: 2020-08-01

## 2020-07-11 MED ORDER — METOPROLOL SUCCINATE ER 50 MG PO TB24
50.0000 mg | ORAL_TABLET | Freq: Every day | ORAL | 0 refills | Status: DC
Start: 2020-07-12 — End: 2020-08-01

## 2020-07-11 MED ORDER — APIXABAN 5 MG PO TABS
5.0000 mg | ORAL_TABLET | Freq: Two times a day (BID) | ORAL | 0 refills | Status: DC
Start: 2020-07-12 — End: 2020-08-01

## 2020-07-11 MED ORDER — PEN NEEDLES 30G X 8 MM MISC: 0 refills | Status: DC

## 2020-07-11 MED ORDER — APIXABAN 5 MG PO TABS: 10.0000 mg | ORAL_TABLET | Freq: Two times a day (BID) | ORAL | 0 refills | Status: DC

## 2020-07-11 NOTE — Assessment & Plan Note (Signed)
Copy of results to PCP.

## 2020-07-11 NOTE — Assessment & Plan Note (Signed)
Update TFTs ° °

## 2020-07-11 NOTE — Progress Notes (Signed)
Discharge instructions placed on chart for receiving facility. IV removed, clean and intact. Pt escorted to [facility] via [facility].  Arletta Bale, RN

## 2020-07-11 NOTE — Assessment & Plan Note (Signed)
The resolution while continuing the statin rules out statin induced rhabdo.

## 2020-07-11 NOTE — Assessment & Plan Note (Signed)
To prevent gout the minimal uric acid goal is < 7; preferred is < 6, ideal or protective value is < 5 .  The most common cause of elevated uric acid is the ingestion of sugar from high fructose corn syrup sources in processed foods & drinks. You should consume less than 40 grams (ideally ZERO) of sugar per day from foods and drinks with high fructose corn syrup as number 1,2, 3, or #4 on the label.  HCTZ can raise uric acid as well.  HCTZ will be entered as a "allergy" because of this risk. Chronic elevation of uric acid is not an isolated arthritic issue;it also increases cardiac & kidney disease risk.

## 2020-07-11 NOTE — Discharge Summary (Signed)
Physician Discharge Summary  [place] SHELLHAMMER WUG:[id number] DOB: December 04, 1960 DOA: 06/28/2020  PCP: Charlott Rakes, MD  Admit date: 06/28/2020 Discharge date: 07/11/2020  Time spent: 35 minutes  Recommendations for Outpatient Follow-up:   Covid vaccination; vaccinated  Acute on CKD stage II -HCTZ, ACEI, ibuprofen, Lasix held  -Hold ACEI for at least 2 weeks  Lab Results  Component Value Date   CREATININE 1.07 07/11/2020   CREATININE 1.19 07/09/2020   CREATININE 1.38 (H) 07/08/2020  -Creatinine has normalized  Acute hyperkalemia: -No EKG changes. Improved with hydration.repeat potassium wnl.    Toxic encephalopathy -Resolved  Acute gouty flareup/ multiple joint pain, / left elbow effusion/ left elbow gout flare up: -4/26 uric acid= 14.1 -8/21 uric acid= 7.3 . Goal= 6 mg/dL -Continue steroids -8/23 Allopurinol 300 mg daily. -8/23 consult to nutrition educate patient on diet to reduce uric acid intake, and thereby reduce gout flares  DM type II uncontrolled with complication -8/88 hemoglobin A1c= 7.1 CBG (last 3)  Lab Results  Component Value Date   GLUCAP 98 07/11/2020   GLUCAP 109 (H) 07/10/2020   GLUCAP 103 (H) 07/10/2020   GLUCAP 129 (H) 07/10/2020   GLUCAP 129 (H) 07/10/2020  -Lantus 15 units BID -Victoza 1.2 mg qAm   Essential hypertension -Well-controlled    Iron deficiency anemia, anemia of chronic disease (baseline HgB ~11 in November 2020) -Gradual decline to 9.7 this admission. -Stool for occult blood is positive. -GI consulted recommended outpatient follow-up for possible EGD or colonoscopy  Lab Results  Component Value Date   HGB 7.7 (L) 07/11/2020   HGB 7.7 (L) 07/10/2020   HGB 7.6 (L) 07/09/2020   HGB 8.4 (L) 07/08/2020   HGB 9.9 (L) 07/07/2020  -Stable  Elevated lactic acid -Probably secondary to dehydration, normalized with IV fluids.  Nontraumatic rhabdomyolysis with AKI, anion gap metabolic acidosis -Improved with  hydration -8/24 CK= 94 safe for discharge  Fever -Resolved  Mildly elevated liver enzymes -Resolved.  Left brachial vein DVT of indeterminate age:  -Started him on IV heparin, then started having nose bleeds.  -V heparin was held and ENT consulted for recommendations.  -Transition to Eliquis  Hypomagnesmia -Resolved   Discharge Diagnoses:  Active Problems:   AKI (acute kidney injury) ([place])   Arthralgia   Left elbow pain   Left hand pain   Non-traumatic rhabdomyolysis   Discharge Condition: Stable  Diet recommendation: Renal/carb modified  Filed Weights   06/28/20 1623 06/28/20 2116  Weight: 116.6 kg 114.2 kg    History of present illness:  59 year old BM PMHx  chronic gouty arthritis of multiple joint, stage II CKD, insulin-dependent diabetes mellitus, hypertension, hyperlipidemia, anemia of chronic disease, iron deficiency anemia, presents to ED for lethargy and multijoint pain. As per the sister-in-law at bedside patient was unable to stand or walk and was brought to ED. He was found to be in AKI, hyperkalemic and in rhabdomyolysis. He was started on aggressive hydration and his creatinine has improved from 6.38 to 2.69. orthopedics consulted for his left elbow swelling and elbow joint effusion. Currently pt is on steroids for the gouty flare up of the multiple joints. He was also found to have guiac positive stools. GI consulted, recommended outpatient colonoscopy and EGD .  PT evaluation recommending SNF. Due to persistent left upper extremity pain,  Ordered venous duplex of the left upper extremity to evaluate for DVT, it was positive for DVT, he was started on IV heparin but it was held for right sided nostril bleeding.  ENT consulted, and waas found to have a clot in the right nostril, which was suctioned out.  No need for packing and bleeding resolved. Pt was restarted on heparin and later on transitioned to eliquis.  Recommend atleast another 7 days of  prednisone for gout treatment on discharge. We will continue steroids for 2 additional days he was also started on colchicine twice a day.  Hospital Course:  See above     Discharge Exam: Vitals:   07/11/20 0410 07/11/20 0435 07/11/20 0622 07/11/20 0745  BP: 104/69  94/68 100/72  Pulse: (!) 107   (!) 107  Resp: 19 (!) 23 (!) 22 (!) 22  Temp: 99.1 F (37.3 C)  99.5 F (37.5 C) 98.5 F (36.9 C)  TempSrc: Oral  Oral Oral  SpO2: 99%  96% 99%  Weight:      Height:        General: A/O x4, No acute respiratory distress Eyes: negative scleral hemorrhage, negative anisocoria, negative icterus ENT: Negative Runny nose, negative gingival bleeding, Neck:  Negative scars, masses, torticollis, lymphadenopathy, JVD Lungs: Clear to auscultation bilaterally without wheezes or crackles Cardiovascular: Regular rate and rhythm without murmur gallop or rub normal S1 and S2    Discharge Instructions  Discharge Instructions    Consult to nephrology   Complete by: As directed      Allergies as of 07/11/2020   No Known Allergies     Medication List    STOP taking these medications   furosemide 80 MG tablet Commonly known as: LASIX   gabapentin 300 MG capsule Commonly known as: NEURONTIN   Insulin Syringe-Needle U-100 30G X 5/16" 1 ML Misc Commonly known as: TRUEplus Insulin Syringe   lisinopril-hydrochlorothiazide 20-12.5 MG tablet Commonly known as: Zestoretic   Mitigare 0.6 MG Caps Generic drug: Colchicine Replaced by: colchicine 0.6 MG tablet   NovoLOG Mix 70/30 (70-30) 100 UNIT/ML injection Generic drug: insulin aspart protamine- aspart     TAKE these medications   Accu-Chek Aviva Plus test strip Generic drug: glucose blood USE AS INSTRUCTED THREE TIMES DAILY. E11.69   Accu-Chek Aviva Plus w/Device Kit USE AS INSTRUCTED THREE TIMES DAILY. E11.69   Accu-Chek Aviva Soln Use to calibrate your glucometer when needed.   Accu-Chek FastClix Lancets Misc 1 each by  Does not apply route 3 (three) times daily. as directed   allopurinol 300 MG tablet Commonly known as: ZYLOPRIM Take 1 tablet (300 mg total) by mouth daily.   apixaban 5 MG Tabs tablet Commonly known as: ELIQUIS Take 2 tablets (10 mg total) by mouth 2 (two) times daily.   apixaban 5 MG Tabs tablet Commonly known as: ELIQUIS Take 1 tablet (5 mg total) by mouth 2 (two) times daily. Start taking on: July 12, 2020   aspirin 81 MG EC tablet Take 1 tablet (81 mg total) by mouth daily.   atorvastatin 40 MG tablet Commonly known as: LIPITOR Take 1 tablet (40 mg total) by mouth daily at 6 PM.   B-D SINGLE USE SWABS REGULAR Pads Please use as instructed before checking blood sugar and before injecting insulin.   cetirizine 10 MG tablet Commonly known as: ZYRTEC TAKE 1 TABLET (10 MG TOTAL) BY MOUTH DAILY.   colchicine 0.6 MG tablet Take 1 tablet (0.6 mg total) by mouth 2 (two) times daily. Replaces: Mitigare 0.6 MG Caps   cyclobenzaprine 10 MG tablet Commonly known as: FLEXERIL Take 10 mg by mouth 3 (three) times daily.   diclofenac Sodium 1 % Gel  Commonly known as: VOLTAREN Apply 2 g topically 4 (four) times daily.   Droplet Pen Needles 32G X 4 MM Misc Generic drug: Insulin Pen Needle USE AS DIRECTED DAILY TO ADMINISTER VICTOZA What changed: Another medication with the same name was added. Make sure you understand how and when to take each.   Pen Needles 30G X 8 MM Misc Use as directed What changed: You were already taking a medication with the same name, and this prescription was added. Make sure you understand how and when to take each.   FeroSul 325 (65 FE) MG tablet Generic drug: ferrous sulfate TAKE 1 TABLET BY MOUTH DAILY WITH BREAKFAST. What changed: how much to take   ibuprofen 200 MG tablet Commonly known as: ADVIL Take 400 mg by mouth every 6 (six) hours as needed for moderate pain.   insulin glargine 100 unit/mL Sopn Commonly known as: LANTUS Inject  0.15 mLs (15 Units total) into the skin 2 (two) times daily.   metFORMIN 500 MG tablet Commonly known as: GLUCOPHAGE Take 2 tablets (1,000 mg total) by mouth 2 (two) times daily with a meal.   metoprolol succinate 50 MG 24 hr tablet Commonly known as: TOPROL-XL Take 1 tablet (50 mg total) by mouth daily. Take with or immediately following a meal. Start taking on: July 12, 2020 What changed: additional instructions   pantoprazole 40 MG tablet Commonly known as: PROTONIX Take 1 tablet (40 mg total) by mouth at bedtime.   traZODone 50 MG tablet Commonly known as: DESYREL Take 1 tablet (50 mg total) by mouth at bedtime.   Victoza 18 MG/3ML Sopn Generic drug: liraglutide Inject 0.2 mLs (1.2 mg total) into the skin every morning.      No Known Allergies    The results of significant diagnostics from this hospitalization (including imaging, microbiology, ancillary and laboratory) are listed below for reference.    Significant Diagnostic Studies: DG Chest 1 View  Result Date: 06/28/2020 CLINICAL DATA:  Chest pain. EXAM: CHEST  1 VIEW COMPARISON:  Chest x-ray dated June 15, 2011. FINDINGS: Borderline cardiomegaly. Normal pulmonary vascularity. Left basilar atelectasis. No focal consolidation, pleural effusion, or pneumothorax. No acute osseous abnormality. IMPRESSION: 1. Left basilar atelectasis. Electronically Signed   By: Titus Dubin M.D.   On: 06/28/2020 17:40   DG Elbow Complete Left  Result Date: 06/28/2020 CLINICAL DATA:  Left elbow pain and swelling after physical therapy today. No injury. EXAM: LEFT ELBOW - COMPLETE 3+ VIEW COMPARISON:  None. FINDINGS: No acute fracture or dislocation. Large joint effusion. Joint spaces are preserved. Small marginal osteophytes. Posterior elbow soft tissue swelling. IMPRESSION: 1. Large joint effusion without acute osseous abnormality. In the absence of recent trauma, this could reflect underlying crystal arthropathy given history of gout.  Electronically Signed   By: Titus Dubin M.D.   On: 06/28/2020 17:42   DG Wrist Complete Left  Result Date: 06/28/2020 CLINICAL DATA:  Left hand and wrist pain and swelling after physical therapy. No injury. EXAM: LEFT HAND - COMPLETE 3+ VIEW; LEFT WRIST - COMPLETE 3+ VIEW COMPARISON:  Left hand x-rays dated May 04, 2020. FINDINGS: No acute fracture or dislocation. Suspected large erosions involving the second and third proximal phalanx heads, third metacarpal head, and capitate are unchanged. Unchanged moderate third PIP joint and mild third MCP joint space narrowing. Remaining joint spaces are preserved. Bone mineralization is normal. New diffuse soft tissue swelling of the wrist, dorsal hand, and index finger. Progressive soft tissue swelling of the long  finger. IMPRESSION: 1. Diffuse soft tissue swelling. No acute osseous abnormality. 2. 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 Electronically Signed   By: Titus Dubin M.D.   On: 06/28/2020 17:48   US RENAL  Result Date: 06/28/2020 CLINICAL DATA:  Acute kidney injury EXAM: RENAL / URINARY TRACT ULTRASOUND COMPLETE COMPARISON:  None. FINDINGS: Right Kidney: Renal measurements: 10.0 x 5.6 x 3.8 cm = volume: 110 mL . Echogenicity within normal limits. No mass or hydronephrosis visualized. Left Kidney: Renal measurements: 9.6 x 5.7 x 3.7 cm = volume: 106 mL. Echogenicity within normal limits. No mass or hydronephrosis visualized. Bladder: Appears normal for degree of bladder distention. Other: None. IMPRESSION: No acute findings.  No hydronephrosis. Electronically Signed   By: Rolm Baptise M.D.   On: 06/28/2020 20:20   DG Shoulder Left  Result Date: 06/28/2020 CLINICAL DATA:  Left shoulder pain after physical therapy today. EXAM: LEFT SHOULDER - 2+ VIEW COMPARISON:  None. FINDINGS: No acute fracture or dislocation. Mild acromioclavicular osteoarthritis. Soft tissues are unremarkable.  IMPRESSION: 1. No acute osseous abnormality. Electronically Signed   By: Titus Dubin M.D.   On: 06/28/2020 17:39   DG Hand Complete Left  Result Date: 06/28/2020 CLINICAL DATA:  Left hand and wrist pain and swelling after physical therapy. No injury. EXAM: LEFT HAND - COMPLETE 3+ VIEW; LEFT WRIST - COMPLETE 3+ VIEW COMPARISON:  Left hand x-rays dated May 04, 2020. FINDINGS: No acute fracture or dislocation. Suspected large erosions involving the second and third proximal phalanx heads, third metacarpal head, and capitate are unchanged. Unchanged moderate third PIP joint and mild third MCP joint space narrowing. Remaining joint spaces are preserved. Bone mineralization is normal. New diffuse soft tissue swelling of the wrist, dorsal hand, and index finger. Progressive soft tissue swelling of the long finger. IMPRESSION: 1. Diffuse soft tissue swelling. No acute osseous abnormality. 2. 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 Electronically Signed   By: Titus Dubin M.D.   On: 06/28/2020 17:48   VAS [place] UPPER EXTREMITY VENOUS DUPLEX  Result Date: 07/04/2020 UPPER VENOUS STUDY  Indications: Swelling Comparison Study: No prior studies. Performing Technologist: Darlin Coco  Examination Guidelines: A complete evaluation includes B-mode imaging, spectral Doppler, color Doppler, and power Doppler as needed of all accessible portions of each vessel. Bilateral testing is considered an integral part of a complete examination. Limited examinations for reoccurring indications may be performed as noted.  Right Findings: +----------+------------+---------+-----------+----------+-------+ RIGHT     CompressiblePhasicitySpontaneousPropertiesSummary +----------+------------+---------+-----------+----------+-------+ Subclavian               Yes       Yes                      +----------+------------+---------+-----------+----------+-------+   Left Findings: +----------+------------+---------+-----------+----------+-----------------+ LEFT      CompressiblePhasicitySpontaneousProperties     Summary      +----------+------------+---------+-----------+----------+-----------------+ IJV           Full       Yes       Yes                                +----------+------------+---------+-----------+----------+-----------------+ Subclavian               Yes       Yes                                +----------+------------+---------+-----------+----------+-----------------+  Axillary      Full       Yes       Yes                                +----------+------------+---------+-----------+----------+-----------------+ Brachial      None       No        No               Age Indeterminate +----------+------------+---------+-----------+----------+-----------------+ Radial        Full                                                    +----------+------------+---------+-----------+----------+-----------------+ Ulnar         Full                                                    +----------+------------+---------+-----------+----------+-----------------+ Cephalic      Full                                                    +----------+------------+---------+-----------+----------+-----------------+ Basilic       Full                                                    +----------+------------+---------+-----------+----------+-----------------+  Summary:  Right: No evidence of thrombosis in the subclavian.  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  *See table(s) above for measurements and observations.  Diagnosing physician: Deitra Mayo MD Electronically signed by Deitra Mayo MD on 07/04/2020 at 7:59:24 AM.    Final     Microbiology: Recent Results (from the past 240 hour(s))  SARS Coronavirus 2 by RT PCR (hospital order,  performed in [facility] hospital lab) Nasopharyngeal Nasopharyngeal Swab     Status: None   Collection Time: 07/05/20 11:12 PM   Specimen: Nasopharyngeal Swab  Result Value Ref Range Status   SARS Coronavirus 2 NEGATIVE NEGATIVE Final    Comment: (NOTE) SARS-CoV-2 target nucleic acids are NOT DETECTED.  The SARS-CoV-2 RNA is generally detectable in upper and lower respiratory specimens during the acute phase of infection. The lowest concentration of SARS-CoV-2 viral copies this assay can detect is 250 copies / mL. A negative result does not preclude SARS-CoV-2 infection and should not be used as the sole basis for treatment or other patient management decisions.  A negative result may occur with improper specimen collection / handling, submission of specimen other than nasopharyngeal swab, presence of viral mutation(s) within the areas targeted by this assay, and inadequate number of viral copies (<250 copies / mL). A negative result must be combined with clinical observations, patient history, and epidemiological information.  Fact Sheet for Patients:   StrictlyIdeas.no  Fact Sheet for Healthcare Providers: BankingDealers.co.za  This test is not yet approved or  cleared by the [place] and has been authorized for detection and/or diagnosis of SARS-CoV-2 by FDA under an Emergency Use Authorization (EUA).  This EUA will remain in effect (meaning this test can be used) for the duration of the COVID-19 declaration under Section 564(b)(1) of the Act, 21 U.S.C. section 360bbb-3(b)(1), unless the authorization is terminated or revoked sooner.  Performed at [facility], [place] [address] [place], [place] [address]   SARS CORONAVIRUS 2 (TAT 6-24 HRS) Nasopharyngeal Nasopharyngeal Swab     Status: None   Collection Time: 07/10/20 10:38 AM   Specimen: Nasopharyngeal Swab  Result Value Ref Range Status   SARS Coronavirus 2  NEGATIVE NEGATIVE Final    Comment: (NOTE) SARS-CoV-2 target nucleic acids are NOT DETECTED.  The SARS-CoV-2 RNA is generally detectable in upper and lower respiratory specimens during the acute phase of infection. Negative results do not preclude SARS-CoV-2 infection, do not rule out co-infections with other pathogens, and should not be used as the sole basis for treatment or other patient management decisions. Negative results must be combined with clinical observations, patient history, and epidemiological information. The expected result is Negative.  Fact Sheet for Patients: SugarRoll.be  Fact Sheet for Healthcare Providers: https://www.[place]-mathews.com/  This test is not yet approved or cleared by the [place] FDA and  has been authorized for detection and/or diagnosis of SARS-CoV-2 by FDA under an Emergency Use Authorization (EUA). This EUA will remain  in effect (meaning this test can be used) for the duration of the COVID-19 declaration under Se ction 564(b)(1) of the Act, 21 U.S.C. section 360bbb-3(b)(1), unless the authorization is terminated or revoked sooner.  Performed at [facility], [place] [address], [place], [place] [address]      Labs: Basic Metabolic Panel: Recent Labs  Lab 07/05/20 0557 07/07/20 1750 07/08/20 0137 07/09/20 0144 07/10/20 0712 07/11/20 0327  NA 138 131* 133* 135  --  133*  K 4.4 5.8* 4.8 5.0  --  4.9  CL 104 99 102 105  --  102  CO2 25 19* 21* 21*  --  20*  GLUCOSE 157* 240* 112* 98  --  91  BUN 37* 47* 46* 38*  --  19  CREATININE 1.29* 1.53* 1.38* 1.19  --  1.07  CALCIUM 9.0 9.3 8.9 8.7*  --  8.9  MG  --   --   --  1.9 1.6* 2.0  PHOS  --   --   --  3.1 2.9 2.8   Liver Function Tests: Recent Labs  Lab 07/09/20 0144 07/11/20 0327  AST 24 20  ALT 27 25  ALKPHOS 53 60  BILITOT 1.0 1.2  PROT 6.1* 6.1*  ALBUMIN 2.5* 2.6*   No results for input(s): LIPASE, AMYLASE in the  last 168 hours. No results for input(s): AMMONIA in the last 168 hours. CBC: Recent Labs  Lab 07/07/20 1750 07/08/20 0137 07/09/20 0144 07/10/20 0712 07/11/20 0327  WBC 17.1* 13.5* 13.4* 13.7* 12.4*  NEUTROABS  --   --  10.4* 11.1* 9.7*  HGB 9.9* 8.4* 7.6* 7.7* 7.7*  HCT 31.7* 25.9* 24.3* 24.8* 24.7*  MCV 78.1* 76.0* 76.4* 78.2* 76.7*  PLT 330 276 298 272 262   Cardiac Enzymes: Recent Labs  Lab 07/11/20 0327  CKTOTAL 93   BNP: BNP (last 3 results) No results for input(s): BNP in the last 8760 hours.  ProBNP (last 3 results) No results for input(s): PROBNP in the last 8760 hours.  CBG: Recent Labs  Lab 07/10/20 1109 07/10/20 1610 07/10/20 2051 07/10/20 2358 07/11/20 0408  GLUCAP 129* 129* 103* 109* 98       Signed:  Dia Crawford, MD [facility] [phone] pager

## 2020-07-11 NOTE — Progress Notes (Signed)
Report called to RN at [facility]. All questions answered. AVS discharge summary placed on chart.  Arletta Bale, RN

## 2020-07-11 NOTE — Assessment & Plan Note (Signed)
Monitor renal function on high-dose Metformin.

## 2020-07-11 NOTE — TOC Transition Note (Signed)
Transition of Care [facility]) - CM/SW Discharge Note   Patient Details  Name: [name] MRN: [id number] Date of Birth: [birth year]  [facility] CM/SW Contact:  Vinie Sill, [place] Phone Number: 07/11/2020, 10:07 AM   Clinical Narrative:     Patient will DC to: [facility]  DC Date: 07/11/2020 Family Notified: Jeanette,friend Transport By: Corey Harold   Per MD patient is ready for discharge. RN, patient, and facility notified of DC. Discharge Summary sent to facility. RN given number for [phone]. Ambulance transport requested for patient.   Clinical Social Worker signing off.  Thurmond Butts, MSW, [place] Clinical Social Worker    Final next level of care: Skilled Nursing Facility Barriers to Discharge: Barriers Resolved   Patient Goals and CMS Choice        Discharge Placement              Patient chooses bed at: [facility] Patient to be transferred to facility by: [place] Name of family member notified: Jeanett Schlein, friend Patient and family notified of of transfer: 07/11/20  Discharge Plan and Services In-house Referral: Clinical Social Work                                   Social Determinants of Health (SDOH) Interventions     Readmission Risk Interventions No flowsheet data found.

## 2020-07-11 NOTE — Progress Notes (Signed)
NURSING HOME LOCATION:  [facility] ROOM NUMBER:  301-A  CODE STATUS:  FULL CODE  PCP:  Charlott Rakes, MD  [place] [address]   This is a comprehensive admission note to [facility] performed on this date less than 30 days from date of admission. Included are preadmission medical/surgical history; reconciled medication list; family history; social history and comprehensive review of systems.  Corrections and additions to the records were documented. Comprehensive physical exam was also performed. Additionally a clinical summary was entered for each active diagnosis pertinent to this admission in the Problem List to enhance continuity of care.  HPI: Patient was hospitalized 8/11-8/24/2021 admitted from home with inability to stand or walk.  He was found to have AKI with associated hyperkalemia and rhabdomyolysis.  Aggressive hydration resulted in creatinine improvement from 6.38 to 1.07.  Orthopedics consulted for swelling in effusion of the left elbow.  Steroids and colchicine were initiated for acute gouty flare involving multiple joints.  The last uric acid on record was 14.1 on 03/13/2020.  At home he had been on lisinopril/HCT Z. Persistent LUE pain prompted venous duplex which documented DVT.  IV heparin was initiated but was held because of right-sided epistaxis.  ENT consulted and removed a clot.  Bleeding resolved without packing.Heparin was restarted and transitioned to Eliquis. Anemia was documented and stools were guaiac positive.  GI consulted and recommended outpatient colonoscopy and EGD once stable. At discharge furosemide 80 mg daily, lisinopril/HCTZ 20/12.5 mg and gabapentin were discontinued. Hospital labs were reviewed.  On 8/11 creatinine was 6.68, potassium 6.2, and GFR 10.  At discharge creatinine was 1.07 and GFR greater than 60.  Discharge medications included Metformin 1000 mg twice daily with meals. At admission CK was 8128; at  discharge  93. He exhibited a microcytic, hypochromic anemia with admission values of 10.1/31.3.  The nadir H/H was 7.6/24.3.  At discharge the values were 7.7/24.7.  Past medical and surgical history: Includes insulin-dependent diabetes, essential hypertension, dyslipidemia, and gout.  He had arthroscopy of the knee in 2017.  Social history: Nondrinker; never smoked.  Family history: Strong family history of diabetes.   Review of systems: He was able to provide me with an adequate history despite scoring 12 out of 30 on his mental status exam.  He validated that he had acute flare of gout in his left elbow and wrist and "one kidney was failing" as reasons for his hospitalization.  He states that the left elbow is still swollen to some extent.  He denied any diabetic symptoms; his high fasting glucose at home was 140.  He does validate occasional edema.  Despite tachycardia on exam and some peripheral edema he denies any active cardiopulmonary symptoms.  He also denies any bleeding dyscrasias. I did discuss role of high fructose corn syrup in causing hyper uricemia and the associated long-term risk of cardiac or renal impairment.  He states that he does not drink sodas with HFC S but mainly drinks water.  I did provide a diagram showing the relationship of HFCS ingestion and triglyceride elevation, central obesity, worsening of diabetes, as well as the associated hyper uricemia.  Constitutional: No fever, significant weight change, fatigue  Eyes: No redness, discharge, pain, vision change ENT/mouth: No nasal congestion, purulent discharge, earache, change in hearing, sore throat  Cardiovascular: No chest pain, palpitations, paroxysmal nocturnal dyspnea, claudication Respiratory: No cough, sputum production, hemoptysis, DOE, significant snoring, apnea Gastrointestinal: No heartburn, dysphagia, abdominal pain, nausea /vomiting, rectal  bleeding, melena, change in bowels Genitourinary: No dysuria,  hematuria, pyuria, incontinence, nocturia Dermatologic: No rash, pruritus, change in appearance of skin Neurologic: No dizziness, headache, syncope, seizures, numbness, tingling Psychiatric: No significant anxiety, depression, insomnia, anorexia Endocrine: No change in hair/skin/nails, excessive thirst, excessive hunger, excessive urination  Hematologic/lymphatic: No significant bruising, lymphadenopathy, abnormal bleeding beyond self limited epistaxis Allergy/immunology: No itchy/watery eyes, significant sneezing, urticaria, angioedema  Physical exam:  Pertinent or positive findings: Affect is somewhat flat.  He has slight ptosis.  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  He exhibits a resting tachycardia with slight intermittent pauses.  Second heart sound is increased.  Abdomen is protuberant.  Pedal pulses are decreased.  He has 1/2+ edema at the sock line.  The left hand and fingers are swollen.  There is faint hyperpigmentation at the left elbow and prominence of the left olecranon process.  Slightly below the knes  he exhibits circumferential hyperpigmentation and keratinization,especially in the popliteal area and somewhat more prominent on the left than the right lower extremity.  General appearance: Adequately nourished; no acute distress, increased work of breathing is present.   Lymphatic: No lymphadenopathy about the head, neck, axilla. Eyes: No conjunctival inflammation or lid edema is present. There is no scleral icterus. Ears:  External ear exam shows no significant lesions or deformities.   Nose:  External nasal examination shows no deformity or inflammation. Nasal mucosa are pink and moist without lesions, exudates Oral exam: Lips and gums are healthy appearing.There is no oropharyngeal erythema or exudate. Neck:  No thyromegaly, masses, tenderness noted.    Heart: S1 normal without gallop, murmur, click, rub.  Lungs: Chest clear to auscultation without wheezes, rhonchi, rales,  rubs. Abdomen: Bowel sounds are normal.  Abdomen is soft and nontender with no organomegaly, hernias, masses. GU: Deferred  Extremities:  No cyanosis, clubbing Neurologic exam:  Strength equal  in upper & lower extremities. Balance, Rhomberg, finger to nose testing could not be completed due to clinical state Skin: Warm & dry w/o tenting.  See clinical summary under each active problem in the Problem List with associated updated therapeutic plan

## 2020-07-11 NOTE — Assessment & Plan Note (Signed)
Monitor for active bleeding dyscrasias in the context of Eliquis therapy for DVT.

## 2020-07-11 NOTE — Patient Instructions (Signed)
See assessment and plan under each diagnosis in the problem list and acutely for this visit 

## 2020-07-12 ENCOUNTER — Other Ambulatory Visit: Payer: Self-pay

## 2020-07-12 NOTE — Patient Outreach (Signed)
[facility]) Care Management  07/12/2020  [name] [birth year] [id number]     Transition of Care Referral  Referral Date: 07/12/2020 Referral Source: [facility] Discharge Report Date of Discharge: 07/11/2020 Facility: [facility]    Referral received. Upon chart review noted that patient discharged to SNF.    Plan: RN CM will close case at this time.    [name], RN,BSN,CCM [place] Management Telephonic Care Management Coordinator Direct Phone: [phone] Toll Free: [phone] Fax: [phone]

## 2020-07-14 ENCOUNTER — Non-Acute Institutional Stay (SKILLED_NURSING_FACILITY): Payer: Medicare HMO | Admitting: Adult Health

## 2020-07-14 ENCOUNTER — Encounter: Payer: Self-pay | Admitting: Adult Health

## 2020-07-14 DIAGNOSIS — L089 Local infection of the skin and subcutaneous tissue, unspecified: Secondary | ICD-10-CM | POA: Diagnosis not present

## 2020-07-14 LAB — TSH: TSH: 1.71 (ref 0.41–5.90)

## 2020-07-14 LAB — CBC AND DIFFERENTIAL
HCT: 27 — AB (ref 41–53)
Hemoglobin: 8.8 — AB (ref 13.5–17.5)
Platelets: 241 (ref 150–399)
WBC: 8.4

## 2020-07-14 LAB — CBC: RBC: 3.53 — AB (ref 3.87–5.11)

## 2020-07-14 NOTE — Progress Notes (Signed)
Location:    [facility] Room Number: 301/A Place of Service:  SNF (31) Provider:  Cindi Carbon NP  Charlott Rakes, MD  Patient Care Team: Charlott Rakes, MD as PCP - General (Family Medicine) Gala Romney Cristopher Estimable, MD as Consulting Physician (Gastroenterology)  Extended Emergency Contact Information Primary Emergency Contact: Bonner,Shirley Address: [address]          [place] [address] [place] Phone: [phone] Relation: Mother Secondary Emergency Contact: [name] Address: [address] [place], [place] [address] [place] Phone: [phone] Relation: Significant other  Code Status:  Full Code Goals of care: Advanced Directive information Advanced Directives 07/14/2020  Does Patient Have a Medical Advance Directive? Yes  Type of Advance Directive (No Data)  Does patient want to make changes to medical advance directive? No - Patient declined  Would patient like information on creating a medical advance directive? -     Chief Complaint  Patient presents with  . Acute Visit    Drainage to Right Elbow    HPI:  Pt is a 59 y.o. male seen today for an acute visit for right elbow drainage. The nurse reports the right elbow had been draining and swollen for two days. He is admitted for rehab to [facility] for hospitalization due to AKI, rhado, DVT, anemia, and gouty flare.  The patient denies pain to the right elbow. The left elbow had a gout flare during his hospitalization but is now resolved. Uric acid on discharge was 7.3 which is improved.    Past Medical History:  Diagnosis Date  . Gouty bursitis, elbow  10/18/2015  . Hyperlipidemia   . Hypertension   . Knee pain, chronic   . Type II diabetes mellitus ([place])    Past Surgical History:  Procedure Laterality Date  . KNEE ARTHROSCOPY Bilateral 12/29/2015   Procedure: ARTHROSCOPY KNEE BILATERAL WITH  PARTIAL  MEDIAL AND LATERAL MENISCECTOMY AND FOUR COMPARTMENT SYNOVECTOMY , CHONDRALPLASTIES, PATELLA-FEMORAL CHONDRALPLASTIES BILATERALLY;  Surgeon: Dorna Leitz, MD;  Location: [facility];  Service: Orthopedics;  Laterality: Bilateral;    Allergies  Allergen Reactions  . Hctz [Hydrochlorothiazide]     Hx of gout; uric acid 14.1 on 03/13/20 Acute gout L elbow 8/11-8/24/21    Allergies as of 07/14/2020      Reactions   Hctz [hydrochlorothiazide]    Hx of gout; uric acid 14.1 on 03/13/20 Acute gout L elbow 8/11-8/24/21      Medication List       Accurate as of July 14, 2020 11:45 AM. If you have any questions, ask your nurse or doctor.        Accu-Chek Aviva Plus test strip Generic drug: glucose blood USE AS INSTRUCTED THREE TIMES DAILY. E11.69   Accu-Chek Aviva Plus w/Device Kit USE AS INSTRUCTED THREE TIMES DAILY. E11.69   Accu-Chek Aviva Soln Use to calibrate your glucometer when needed.   Accu-Chek FastClix Lancets Misc 1 each by Does not apply route 3 (three) times daily. as directed   allopurinol 300 MG tablet Commonly known as: ZYLOPRIM Take 1 tablet (300 mg total) by mouth daily.   apixaban 5 MG Tabs tablet Commonly known as: ELIQUIS Take 1 tablet (5 mg total) by mouth 2 (two) times daily.   aspirin 81 MG EC tablet Take 1 tablet (81 mg total) by mouth daily.   atorvastatin 40 MG tablet Commonly known  as: LIPITOR Take 1 tablet (40 mg total) by mouth daily at 6 PM.   B-D SINGLE USE SWABS REGULAR Pads Please use as instructed before checking blood sugar and before injecting insulin.   colchicine 0.6 MG tablet Take 1 tablet (0.6 mg total) by mouth 2 (two) times daily.   cyclobenzaprine 10 MG tablet Commonly known as: FLEXERIL Take 10 mg by mouth 3 (three) times daily. For Muscle Pain   diclofenac Sodium 1 % Gel Commonly known as: VOLTAREN Apply 2 g topically 4 (four) times daily.   Droplet Pen Needles 32G X 4 MM Misc Generic drug: Insulin Pen Needle USE AS  DIRECTED DAILY TO ADMINISTER VICTOZA   Pen Needles 30G X 8 MM Misc Use as directed   ferrous sulfate 325 (65 FE) MG tablet Take 325 mg by mouth daily with breakfast. For Iron Deficiency Anemia   insulin glargine 100 unit/mL Sopn Commonly known as: LANTUS Inject 0.15 mLs (15 Units total) into the skin 2 (two) times daily.   metFORMIN 500 MG tablet Commonly known as: GLUCOPHAGE Take 2 tablets (1,000 mg total) by mouth 2 (two) times daily with a meal.   metoprolol succinate 50 MG 24 hr tablet Commonly known as: TOPROL-XL Take 1 tablet (50 mg total) by mouth daily. Take with or immediately following a meal.   NON FORMULARY Diet- Renal / CCD Regular Diet   pantoprazole 40 MG tablet Commonly known as: PROTONIX Take 1 tablet (40 mg total) by mouth at bedtime.   traZODone 50 MG tablet Commonly known as: DESYREL Take 1 tablet (50 mg total) by mouth at bedtime.   Victoza 18 MG/3ML Sopn Generic drug: liraglutide Inject 0.2 mLs (1.2 mg total) into the skin every morning.       Review of Systems  Constitutional: Positive for activity change. Negative for appetite change, chills, diaphoresis, fatigue, fever and unexpected weight change.  Respiratory: Negative for cough and shortness of breath.   Cardiovascular: Positive for leg swelling. Negative for chest pain and palpitations.  Gastrointestinal: Negative for abdominal distention, constipation and diarrhea.  Musculoskeletal: Positive for gait problem and joint swelling. Negative for arthralgias and back pain.    Immunization History  Administered Date(s) Administered  . Influenza,inj,Quad PF,6+ Mos 10/19/2015, 08/11/2018  . Influenza-Unspecified 08/07/2017  . PFIZER SARS-COV-2 Vaccination 02/19/2020, 03/18/2020  . Pneumococcal Polysaccharide-23 10/10/2016  . Tdap 12/24/2018   Pertinent  Health Maintenance Due  Topic Date Due  . OPHTHALMOLOGY EXAM  Never done  . COLONOSCOPY  Never done  . URINE MICROALBUMIN  03/29/2020  .  INFLUENZA VACCINE  06/18/2020  . FOOT EXAM  12/16/2020  . HEMOGLOBIN A1C  12/29/2020   Fall Risk  10/19/2019 06/29/2019 06/23/2018 10/13/2013  Falls in the past year? 0 0 No No   Functional Status Survey:    Vitals:   07/14/20 1143  BP: 100/70  Pulse: 90  Resp: 18  Temp: 97.6 F (36.4 C)  TempSrc: Oral  Weight: 233 lb (105.7 kg)  Height: '5\' 5"'  (1.651 m)   Body mass index is 38.77 kg/m. Physical Exam Vitals reviewed.  Musculoskeletal:        General: Swelling (right elbow with no pain, warmth or tendernes. also no spreading erythema) present.     Right lower leg: Edema present.     Left lower leg: Edema present.  Skin:    Comments: Right elbow with pustule with draining purulent matter and surrounding swelling. Left elbow WNL  Neurological:     Mental Status: He  is alert and oriented to person, place, and time.  Psychiatric:        Mood and Affect: Mood normal.     Labs reviewed: Recent Labs    07/08/20 0137 07/09/20 0144 07/10/20 0712 07/11/20 0327  NA 133* 135  --  133*  K 4.8 5.0  --  4.9  CL 102 105  --  102  CO2 21* 21*  --  20*  GLUCOSE 112* 98  --  91  BUN 46* 38*  --  19  CREATININE 1.38* 1.19  --  1.07  CALCIUM 8.9 8.7*  --  8.9  MG  --  1.9 1.6* 2.0  PHOS  --  3.1 2.9 2.8   Recent Labs    07/03/20 0122 07/09/20 0144 07/11/20 0327  AST 45* 24 20  ALT 45* 27 25  ALKPHOS 60 53 60  BILITOT 1.0 1.0 1.2  PROT 6.9 6.1* 6.1*  ALBUMIN 2.5* 2.5* 2.6*   Recent Labs    07/09/20 0144 07/10/20 0712 07/11/20 0327  WBC 13.4* 13.7* 12.4*  NEUTROABS 10.4* 11.1* 9.7*  HGB 7.6* 7.7* 7.7*  HCT 24.3* 24.8* 24.7*  MCV 76.4* 78.2* 76.7*  PLT 298 272 262   Lab Results  Component Value Date   TSH 0.994 12/28/2015   Lab Results  Component Value Date   HGBA1C 7.1 (H) 06/28/2020   Lab Results  Component Value Date   CHOL 114 03/13/2020   HDL 29 (L) 03/13/2020   LDLCALC 61 03/13/2020   TRIG 135 03/13/2020   CHOLHDL 3.9 03/13/2020    Significant  Diagnostic Results in last 30 days:  DG Chest 1 View  Result Date: 06/28/2020 CLINICAL DATA:  Chest pain. EXAM: CHEST  1 VIEW COMPARISON:  Chest x-ray dated June 15, 2011. FINDINGS: Borderline cardiomegaly. Normal pulmonary vascularity. Left basilar atelectasis. No focal consolidation, pleural effusion, or pneumothorax. No acute osseous abnormality. IMPRESSION: 1. Left basilar atelectasis. Electronically Signed   By: Titus Dubin M.D.   On: 06/28/2020 17:40   DG Elbow Complete Left  Result Date: 06/28/2020 CLINICAL DATA:  Left elbow pain and swelling after physical therapy today. No injury. EXAM: LEFT ELBOW - COMPLETE 3+ VIEW COMPARISON:  None. FINDINGS: No acute fracture or dislocation. Large joint effusion. Joint spaces are preserved. Small marginal osteophytes. Posterior elbow soft tissue swelling. IMPRESSION: 1. Large joint effusion without acute osseous abnormality. In the absence of recent trauma, this could reflect underlying crystal arthropathy given history of gout. Electronically Signed   By: Titus Dubin M.D.   On: 06/28/2020 17:42   DG Wrist Complete Left  Result Date: 06/28/2020 CLINICAL DATA:  Left hand and wrist pain and swelling after physical therapy. No injury. EXAM: LEFT HAND - COMPLETE 3+ VIEW; LEFT WRIST - COMPLETE 3+ VIEW COMPARISON:  Left hand x-rays dated May 04, 2020. FINDINGS: No acute fracture or dislocation. Suspected large erosions involving the second and third proximal phalanx heads, third metacarpal head, and capitate are unchanged. Unchanged moderate third PIP joint and mild third MCP joint space narrowing. Remaining joint spaces are preserved. Bone mineralization is normal. New diffuse soft tissue swelling of the wrist, dorsal hand, and index finger. Progressive soft tissue swelling of the long finger. IMPRESSION: 1. Diffuse soft tissue swelling. No acute osseous abnormality. 2. 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 Electronically Signed   By: Titus Dubin M.D.   On: 06/28/2020 17:48   US RENAL  Result Date: 06/28/2020 CLINICAL DATA:  Acute kidney injury EXAM: RENAL / URINARY TRACT ULTRASOUND COMPLETE COMPARISON:  None. FINDINGS: Right Kidney: Renal measurements: 10.0 x 5.6 x 3.8 cm = volume: 110 mL . Echogenicity within normal limits. No mass or hydronephrosis visualized. Left Kidney: Renal measurements: 9.6 x 5.7 x 3.7 cm = volume: 106 mL. Echogenicity within normal limits. No mass or hydronephrosis visualized. Bladder: Appears normal for degree of bladder distention. Other: None. IMPRESSION: No acute findings.  No hydronephrosis. Electronically Signed   By: Rolm Baptise M.D.   On: 06/28/2020 20:20   DG Shoulder Left  Result Date: 06/28/2020 CLINICAL DATA:  Left shoulder pain after physical therapy today. EXAM: LEFT SHOULDER - 2+ VIEW COMPARISON:  None. FINDINGS: No acute fracture or dislocation. Mild acromioclavicular osteoarthritis. Soft tissues are unremarkable. IMPRESSION: 1. No acute osseous abnormality. Electronically Signed   By: Titus Dubin M.D.   On: 06/28/2020 17:39   DG Hand Complete Left  Result Date: 06/28/2020 CLINICAL DATA:  Left hand and wrist pain and swelling after physical therapy. No injury. EXAM: LEFT HAND - COMPLETE 3+ VIEW; LEFT WRIST - COMPLETE 3+ VIEW COMPARISON:  Left hand x-rays dated May 04, 2020. FINDINGS: No acute fracture or dislocation. Suspected large erosions involving the second and third proximal phalanx heads, third metacarpal head, and capitate are unchanged. Unchanged moderate third PIP joint and mild third MCP joint space narrowing. Remaining joint spaces are preserved. Bone mineralization is normal. New diffuse soft tissue swelling of the wrist, dorsal hand, and index finger. Progressive soft tissue swelling of the long finger. IMPRESSION: 1. Diffuse soft tissue swelling. No acute osseous abnormality. 2. Suspected gouty arthropathy of the  wrist and hand, unchanged in appearance when compared to prior study. Electronically Signed   By: Titus Dubin M.D.   On: 06/28/2020 17:48   VAS [place] UPPER EXTREMITY VENOUS DUPLEX  Result Date: 07/04/2020 UPPER VENOUS STUDY  Indications: Swelling Comparison Study: No prior studies. Performing Technologist: Darlin Coco  Examination Guidelines: A complete evaluation includes B-mode imaging, spectral Doppler, color Doppler, and power Doppler as needed of all accessible portions of each vessel. Bilateral testing is considered an integral part of a complete examination. Limited examinations for reoccurring indications may be performed as noted.  Right Findings: +----------+------------+---------+-----------+----------+-------+ RIGHT     CompressiblePhasicitySpontaneousPropertiesSummary +----------+------------+---------+-----------+----------+-------+ Subclavian               Yes       Yes                      +----------+------------+---------+-----------+----------+-------+  Left Findings: +----------+------------+---------+-----------+----------+-----------------+ LEFT      CompressiblePhasicitySpontaneousProperties     Summary      +----------+------------+---------+-----------+----------+-----------------+ IJV           Full       Yes       Yes                                +----------+------------+---------+-----------+----------+-----------------+ Subclavian               Yes       Yes                                +----------+------------+---------+-----------+----------+-----------------+ Axillary      Full       Yes       Yes                                +----------+------------+---------+-----------+----------+-----------------+  Brachial      None       No        No               Age Indeterminate +----------+------------+---------+-----------+----------+-----------------+ Radial        Full                                                     +----------+------------+---------+-----------+----------+-----------------+ Ulnar         Full                                                    +----------+------------+---------+-----------+----------+-----------------+ Cephalic      Full                                                    +----------+------------+---------+-----------+----------+-----------------+ Basilic       Full                                                    +----------+------------+---------+-----------+----------+-----------------+  Summary:  Right: No evidence of thrombosis in the subclavian.  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  *See table(s) above for measurements and observations.  Diagnosing physician: Deitra Mayo MD Electronically signed by Deitra Mayo MD on 07/04/2020 at 7:59:24 AM.    Final     Assessment/Plan  1. Skin infection There is a pustule with surrounding swelling that is draining to the right elbow. There is no pain to the area and the prior left gouty arthritis has resolved. Given his hx of diabetes and recent hospitalization will treat for infection. If no improvement or worsening notify [place].    Family/ staff Communication: resident/nurse  Labs/tests ordered:NA, will need f/u uric acid

## 2020-07-17 ENCOUNTER — Non-Acute Institutional Stay (SKILLED_NURSING_FACILITY): Payer: Medicare HMO | Admitting: Adult Health

## 2020-07-17 ENCOUNTER — Encounter: Payer: Self-pay | Admitting: Adult Health

## 2020-07-17 DIAGNOSIS — K5901 Slow transit constipation: Secondary | ICD-10-CM | POA: Diagnosis not present

## 2020-07-17 DIAGNOSIS — E1149 Type 2 diabetes mellitus with other diabetic neurological complication: Secondary | ICD-10-CM | POA: Diagnosis not present

## 2020-07-17 NOTE — Progress Notes (Signed)
Location:  [facility] Room Number: 026-V Place of Service:  SNF (31) Provider:  Durenda Age, DNP, FNP-BC  Patient Care Team: Charlott Rakes, MD as PCP - General (Family Medicine) Gala Romney Cristopher Estimable, MD as Consulting Physician (Gastroenterology)  Extended Emergency Contact Information Primary Emergency Contact: Bonner,Shirley Address: [address]          [place] [address] [place] Phone: [phone] Relation: Mother Secondary Emergency Contact: [name] Address: [place], [place] [address] [place] Phone: [phone] Relation: Significant other  Code Status:  FULL CODE  Goals of care: Advanced Directive information Advanced Directives 07/14/2020  Does Patient Have a Medical Advance Directive? Yes  Type of Advance Directive (No Data)  Does patient want to make changes to medical advance directive? No - Patient declined  Would patient like information on creating a medical advance directive? -     Chief Complaint  Patient presents with  . Acute Visit    Patient is seen for hypoglycemia (glucose 60)    HPI:  [name] is a 59 y.o. male seen today for hypoglycemia.  He says short-term rehabilitation resident of [facility].  He has a PMH of IDDM, essential hypertension, dyslipidemia and gout.  Staff reported that his CBG this morning was 68.  CBGs ranging from 16-116. He is currently taking Victoza 1.2 mg injection every morning, Metformin 1000 mg twice a day and Lantus dose 15 units twice a day for diabetes mellitus.  Last hemoglobin A1c 7.1, taken 06/28/2020.  He was seen in the room today.  He complained of constipation.  He was admitted to [facility] on 07/10/2020 and was reported to have not had BM X 7 days.  He denies having abdominal pain.   Past Medical History:  Diagnosis Date  . Gouty bursitis, elbow  10/18/2015    . Hyperlipidemia   . Hypertension   . Knee pain, chronic   . Type II diabetes mellitus ([place])    Past Surgical History:  Procedure Laterality Date  . KNEE ARTHROSCOPY Bilateral 12/29/2015   Procedure: ARTHROSCOPY KNEE BILATERAL WITH  PARTIAL MEDIAL AND LATERAL MENISCECTOMY AND FOUR COMPARTMENT SYNOVECTOMY , CHONDRALPLASTIES, PATELLA-FEMORAL CHONDRALPLASTIES BILATERALLY;  Surgeon: Dorna Leitz, MD;  Location: [facility];  Service: Orthopedics;  Laterality: Bilateral;    Allergies  Allergen Reactions  . Hctz [Hydrochlorothiazide]     Hx of gout; uric acid 14.1 on 03/13/20 Acute gout L elbow 8/11-8/24/21    Outpatient Encounter Medications as of 07/17/2020  Medication Sig  . allopurinol (ZYLOPRIM) 300 MG tablet Take 1 tablet (300 mg total) by mouth daily.  Marland Kitchen apixaban (ELIQUIS) 5 MG TABS tablet Take 1 tablet (5 mg total) by mouth 2 (two) times daily.  Marland Kitchen aspirin 81 MG EC tablet Take 1 tablet (81 mg total) by mouth daily.  Marland Kitchen atorvastatin (LIPITOR) 40 MG tablet Take 1 tablet (40 mg total) by mouth daily at 6 PM.  . cephALEXin (KEFLEX) 500 MG capsule Take 500 mg by mouth 4 (four) times daily.  . colchicine 0.6 MG tablet Take 1 tablet (0.6 mg total) by mouth 2 (two) times daily.  . cyclobenzaprine (FLEXERIL) 10 MG tablet Take 10 mg by mouth 3 (three) times daily. For Muscle Pain  . diclofenac Sodium (VOLTAREN) 1 % GEL Apply 2 g topically 4 (four) times daily.  . ferrous sulfate 325 (65 FE) MG tablet  Take 325 mg by mouth daily with breakfast. For Iron Deficiency Anemia  . insulin glargine (LANTUS) 100 unit/mL SOPN Inject 0.15 mLs (15 Units total) into the skin 2 (two) times daily.  Marland Kitchen liraglutide (VICTOZA) 18 MG/3ML SOPN Inject 0.2 mLs (1.2 mg total) into the skin every morning.  . metFORMIN (GLUCOPHAGE) 500 MG tablet Take 2 tablets (1,000 mg total) by mouth 2 (two) times daily with a meal.  . metoprolol succinate (TOPROL-XL) 50 MG 24 hr tablet Take 1 tablet (50 mg total) by mouth daily. Take with or  immediately following a meal.  . NON FORMULARY Diet- Renal / CCD Regular Diet  . pantoprazole (PROTONIX) 40 MG tablet Take 1 tablet (40 mg total) by mouth at bedtime.  . traZODone (DESYREL) 50 MG tablet Take 1 tablet (50 mg total) by mouth at bedtime.  . Accu-Chek FastClix Lancets MISC 1 each by Does not apply route 3 (three) times daily. as directed (Patient not taking: Reported on 07/11/2020)  . Alcohol Swabs (B-D SINGLE USE SWABS REGULAR) PADS Please use as instructed before checking blood sugar and before injecting insulin. (Patient not taking: Reported on 07/11/2020)  . Blood Glucose Calibration (ACCU-CHEK AVIVA) SOLN Use to calibrate your glucometer when needed. (Patient not taking: Reported on 07/11/2020)  . Blood Glucose Monitoring Suppl (ACCU-CHEK AVIVA PLUS) w/Device KIT USE AS INSTRUCTED THREE TIMES DAILY. E11.69 (Patient not taking: Reported on 07/11/2020)  . DROPLET PEN NEEDLES 32G X 4 MM MISC USE AS DIRECTED DAILY TO ADMINISTER VICTOZA (Patient not taking: Reported on 07/11/2020)  . glucose blood (ACCU-CHEK AVIVA PLUS) test strip USE AS INSTRUCTED THREE TIMES DAILY. E11.69 (Patient not taking: Reported on 07/11/2020)  . Insulin Pen Needle (PEN NEEDLES) 30G X 8 MM MISC Use as directed (Patient not taking: Reported on 07/11/2020)   No facility-administered encounter medications on file as of 07/17/2020.    Review of Systems  GENERAL: No change in appetite, no fatigue, no weight changes, no fever, chills or weakness MOUTH and THROAT: Denies oral discomfort, gingival pain or bleeding, pain from teeth or hoarseness   RESPIRATORY: no cough, SOB, DOE, wheezing, hemoptysis CARDIAC: No chest pain or palpitations GI: No abdominal pain, +constipation GU: Denies dysuria, frequency, hematuria, incontinence, or discharge NEUROLOGICAL: Denies dizziness, syncope, numbness, or headache PSYCHIATRIC: Denies feelings of depression or anxiety. No report of hallucinations, insomnia, paranoia, or  agitation   Immunization History  Administered Date(s) Administered  . Influenza,inj,Quad PF,6+ Mos 10/19/2015, 08/11/2018  . Influenza-Unspecified 08/07/2017  . PFIZER SARS-COV-2 Vaccination 02/19/2020, 03/18/2020  . Pneumococcal Polysaccharide-23 10/10/2016  . Tdap 12/24/2018   Pertinent  Health Maintenance Due  Topic Date Due  . OPHTHALMOLOGY EXAM  Never done  . COLONOSCOPY  Never done  . URINE MICROALBUMIN  03/29/2020  . INFLUENZA VACCINE  06/18/2020  . FOOT EXAM  12/16/2020  . HEMOGLOBIN A1C  12/29/2020   Fall Risk  10/19/2019 06/29/2019 06/23/2018 10/13/2013  Falls in the past year? 0 0 No No     Vitals:   07/17/20 1515  BP: 110/89  Pulse: 90  Resp: 20  Temp: 97.6 F (36.4 C)  TempSrc: Oral  Weight: 233 lb (105.7 kg)  Height: '5\' 5"'  (1.651 m)   Body mass index is 38.77 kg/m.  Physical Exam  GENERAL APPEARANCE: Well nourished. In no acute distress. Obese SKIN:  Skin is warm and dry.  MOUTH and THROAT: Lips are without lesions. Oral mucosa is moist and without lesions. Tongue is normal in shape, size, and color and  without lesions RESPIRATORY: Breathing is even & unlabored, BS CTAB CARDIAC: RRR, no murmur,no extra heart sounds, BLE 2+ edema GI: Abdomen soft, normal BS, no masses, no tenderness EXTREMITIES: Able to move x4 extremities NEUROLOGICAL: There is no tremor. Speech is clear. Alert and oriented X 3. PSYCHIATRIC:  Affect and behavior are appropriate  Labs reviewed: Recent Labs    07/08/20 0137 07/09/20 0144 07/10/20 0712 07/11/20 0327  NA 133* 135  --  133*  K 4.8 5.0  --  4.9  CL 102 105  --  102  CO2 21* 21*  --  20*  GLUCOSE 112* 98  --  91  BUN 46* 38*  --  19  CREATININE 1.38* 1.19  --  1.07  CALCIUM 8.9 8.7*  --  8.9  MG  --  1.9 1.6* 2.0  PHOS  --  3.1 2.9 2.8   Recent Labs    07/03/20 0122 07/09/20 0144 07/11/20 0327  AST 45* 24 20  ALT 45* 27 25  ALKPHOS 60 53 60  BILITOT 1.0 1.0 1.2  PROT 6.9 6.1* 6.1*  ALBUMIN 2.5* 2.5*  2.6*   Recent Labs    07/09/20 0144 07/10/20 0712 07/11/20 0327  WBC 13.4* 13.7* 12.4*  NEUTROABS 10.4* 11.1* 9.7*  HGB 7.6* 7.7* 7.7*  HCT 24.3* 24.8* 24.7*  MCV 76.4* 78.2* 76.7*  PLT 298 272 262   Lab Results  Component Value Date   TSH 0.994 12/28/2015   Lab Results  Component Value Date   HGBA1C 7.1 (H) 06/28/2020   Lab Results  Component Value Date   CHOL 114 03/13/2020   HDL 29 (L) 03/13/2020   LDLCALC 61 03/13/2020   TRIG 135 03/13/2020   CHOLHDL 3.9 03/13/2020    Significant Diagnostic Results in last 30 days:  DG Chest 1 View  Result Date: 06/28/2020 CLINICAL DATA:  Chest pain. EXAM: CHEST  1 VIEW COMPARISON:  Chest x-ray dated June 15, 2011. FINDINGS: Borderline cardiomegaly. Normal pulmonary vascularity. Left basilar atelectasis. No focal consolidation, pleural effusion, or pneumothorax. No acute osseous abnormality. IMPRESSION: 1. Left basilar atelectasis. Electronically Signed   By: Titus Dubin M.D.   On: 06/28/2020 17:40   DG Elbow Complete Left  Result Date: 06/28/2020 CLINICAL DATA:  Left elbow pain and swelling after physical therapy today. No injury. EXAM: LEFT ELBOW - COMPLETE 3+ VIEW COMPARISON:  None. FINDINGS: No acute fracture or dislocation. Large joint effusion. Joint spaces are preserved. Small marginal osteophytes. Posterior elbow soft tissue swelling. IMPRESSION: 1. Large joint effusion without acute osseous abnormality. In the absence of recent trauma, this could reflect underlying crystal arthropathy given history of gout. Electronically Signed   By: Titus Dubin M.D.   On: 06/28/2020 17:42   DG Wrist Complete Left  Result Date: 06/28/2020 CLINICAL DATA:  Left hand and wrist pain and swelling after physical therapy. No injury. EXAM: LEFT HAND - COMPLETE 3+ VIEW; LEFT WRIST - COMPLETE 3+ VIEW COMPARISON:  Left hand x-rays dated May 04, 2020. FINDINGS: No acute fracture or dislocation. Suspected large erosions involving the second and  third proximal phalanx heads, third metacarpal head, and capitate are unchanged. Unchanged moderate third PIP joint and mild third MCP joint space narrowing. Remaining joint spaces are preserved. Bone mineralization is normal. New diffuse soft tissue swelling of the wrist, dorsal hand, and index finger. Progressive soft tissue swelling of the long finger. IMPRESSION: 1. Diffuse soft tissue swelling. No acute osseous abnormality. 2. 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 Electronically Signed   By: Titus Dubin M.D.   On: 06/28/2020 17:48   US RENAL  Result Date: 06/28/2020 CLINICAL DATA:  Acute kidney injury EXAM: RENAL / URINARY TRACT ULTRASOUND COMPLETE COMPARISON:  None. FINDINGS: Right Kidney: Renal measurements: 10.0 x 5.6 x 3.8 cm = volume: 110 mL . Echogenicity within normal limits. No mass or hydronephrosis visualized. Left Kidney: Renal measurements: 9.6 x 5.7 x 3.7 cm = volume: 106 mL. Echogenicity within normal limits. No mass or hydronephrosis visualized. Bladder: Appears normal for degree of bladder distention. Other: None. IMPRESSION: No acute findings.  No hydronephrosis. Electronically Signed   By: Rolm Baptise M.D.   On: 06/28/2020 20:20   DG Shoulder Left  Result Date: 06/28/2020 CLINICAL DATA:  Left shoulder pain after physical therapy today. EXAM: LEFT SHOULDER - 2+ VIEW COMPARISON:  None. FINDINGS: No acute fracture or dislocation. Mild acromioclavicular osteoarthritis. Soft tissues are unremarkable. IMPRESSION: 1. No acute osseous abnormality. Electronically Signed   By: Titus Dubin M.D.   On: 06/28/2020 17:39   DG Hand Complete Left  Result Date: 06/28/2020 CLINICAL DATA:  Left hand and wrist pain and swelling after physical therapy. No injury. EXAM: LEFT HAND - COMPLETE 3+ VIEW; LEFT WRIST - COMPLETE 3+ VIEW COMPARISON:  Left hand x-rays dated May 04, 2020. FINDINGS: No acute fracture or dislocation.  Suspected large erosions involving the second and third proximal phalanx heads, third metacarpal head, and capitate are unchanged. Unchanged moderate third PIP joint and mild third MCP joint space narrowing. Remaining joint spaces are preserved. Bone mineralization is normal. New diffuse soft tissue swelling of the wrist, dorsal hand, and index finger. Progressive soft tissue swelling of the long finger. IMPRESSION: 1. Diffuse soft tissue swelling. No acute osseous abnormality. 2. 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 Electronically Signed   By: Titus Dubin M.D.   On: 06/28/2020 17:48   VAS [place] UPPER EXTREMITY VENOUS DUPLEX  Result Date: 07/04/2020 UPPER VENOUS STUDY  Indications: Swelling Comparison Study: No prior studies. Performing Technologist: Darlin Coco  Examination Guidelines: A complete evaluation includes B-mode imaging, spectral Doppler, color Doppler, and power Doppler as needed of all accessible portions of each vessel. Bilateral testing is considered an integral part of a complete examination. Limited examinations for reoccurring indications may be performed as noted.  Right Findings: +----------+------------+---------+-----------+----------+-------+ RIGHT     CompressiblePhasicitySpontaneousPropertiesSummary +----------+------------+---------+-----------+----------+-------+ Subclavian               Yes       Yes                      +----------+------------+---------+-----------+----------+-------+  Left Findings: +----------+------------+---------+-----------+----------+-----------------+ LEFT      CompressiblePhasicitySpontaneousProperties     Summary      +----------+------------+---------+-----------+----------+-----------------+ IJV           Full       Yes       Yes                                +----------+------------+---------+-----------+----------+-----------------+ Subclavian                Yes       Yes                                +----------+------------+---------+-----------+----------+-----------------+  Axillary      Full       Yes       Yes                                +----------+------------+---------+-----------+----------+-----------------+ Brachial      None       No        No               Age Indeterminate +----------+------------+---------+-----------+----------+-----------------+ Radial        Full                                                    +----------+------------+---------+-----------+----------+-----------------+ Ulnar         Full                                                    +----------+------------+---------+-----------+----------+-----------------+ Cephalic      Full                                                    +----------+------------+---------+-----------+----------+-----------------+ Basilic       Full                                                    +----------+------------+---------+-----------+----------+-----------------+  Summary:  Right: No evidence of thrombosis in the subclavian.  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  *See table(s) above for measurements and observations.  Diagnosing physician: Deitra Mayo MD Electronically signed by Deitra Mayo MD on 07/04/2020 at 7:59:24 AM.    Final     Assessment/Plan  1. DM (diabetes mellitus), type 2 with neurological complications (HCC) Lab Results  Component Value Date   HGBA1C 7.1 (H) 06/28/2020   -  Will decrease Lantus from 15 units BID to Q HS -  Continue Metformin 1000 mg twice a day and Victoza 1.2 mgInjection daily.  2. Slow transit constipation -   Start MiraLAX 17 g twice a day     Family/ staff Communication: Discussed plan of care with resident and charge nurse.  Labs/tests ordered: None  Goals of care:   Short-term care   Durenda Age, DNP, MSN, FNP-BC [facility] [phone] (Monday-Friday 8:00 a.m. - 5:00 p.m.) [phone] (after hours)

## 2020-07-18 ENCOUNTER — Non-Acute Institutional Stay (SKILLED_NURSING_FACILITY): Payer: Medicare HMO | Admitting: Internal Medicine

## 2020-07-18 ENCOUNTER — Encounter: Payer: Self-pay | Admitting: Internal Medicine

## 2020-07-18 DIAGNOSIS — E1149 Type 2 diabetes mellitus with other diabetic neurological complication: Secondary | ICD-10-CM

## 2020-07-18 DIAGNOSIS — R000 Tachycardia, unspecified: Secondary | ICD-10-CM

## 2020-07-18 DIAGNOSIS — R112 Nausea with vomiting, unspecified: Secondary | ICD-10-CM | POA: Diagnosis not present

## 2020-07-18 NOTE — Assessment & Plan Note (Addendum)
By history and physical diabetic gastroparesis is suspect.  Trial of Reglan will be initiated tid ac daily

## 2020-07-18 NOTE — Progress Notes (Signed)
   NURSING HOME LOCATION:  [facility] ROOM NUMBER:  [id number]  CODE STATUS:  FULL CODE  PCP:  Charlott Rakes, MD  [place] [address]   This is a nursing facility follow up for specific acute issue of nausea and vomiting.  Interim medical record and care since last [facility] visit was updated with review of diagnostic studies and change in clinical status since last visit were documented.  HPI: Staff reported nausea and vomiting seemingly related to meals.  He validates that this has been going on for several days, more often with lunch and the evening meal.  He also describes intermittent slight dysphagia prior to the nausea and vomiting.  He denies any other active GI symptoms. He is on Protonix 40 mg nightly.  Specifically he denies heartburn, abdominal pain, change in stool, melena, or rectal bleeding.  Specifically he denied any chalk color stool or coke colored urine. He states he has had a colonoscopy (no records in Epic); but he denies having had an upper endoscopy.  He denies history of gallbladder disease.  He is not familiar with the term diabetic gastroparesis. He was seen yesterday for hypoglycemia.  Morning glucoses have ranged from 60-116.  Evening glucoses have ranged from 104 up to 116.  He is on insulin glargine 15 units twice daily; Metformin 1000 mg twice daily; and Victoza 1.2 mg daily.  Review of systems:  Constitutional: No fever, significant weight change, fatigue  Cardiovascular: No chest pain, palpitations, paroxysmal nocturnal dyspnea, claudication, edema  Respiratory: No cough, sputum production, hemoptysis Genitourinary: No dysuria, hematuria, pyuria, incontinence, nocturia Dermatologic: No rash, pruritus, change in appearance of skin Endocrine: No change in hair/skin/nails, excessive thirst, excessive hunger, excessive urination  Hematologic/lymphatic: No significant bruising, lymphadenopathy, abnormal bleeding  Physical  exam:  Pertinent or positive findings: Pattern alopecia is present.  He has a senile keratosis over the crown.  Higinio Plan and [place] are present.  He has isolated dental caries.  Heart sounds are distant. There is a slight gallop cadence but the previously noted tachycardia has improved.  Abdomen is protuberant.  He has an umbilical hernia.  He has nonpitting edema.  Pedal pulses are decreased.  General appearance: Adequately nourished; no acute distress, increased work of breathing is present.   Lymphatic: No lymphadenopathy about the head, neck, axilla. Eyes: No conjunctival inflammation or lid edema is present. There is no scleral icterus. Ears:  External ear exam shows no significant lesions or deformities.   Nose:  External nasal examination shows no deformity or inflammation. Nasal mucosa are pink and moist without lesions, exudates Oral exam:  Lips and gums are healthy appearing. There is no oropharyngeal erythema or exudate. Neck:  No thyromegaly, masses, tenderness noted.    Heart:  Normal rate and regular rhythm. S1 and S2 normal without murmur, click, rub .  Lungs: Chest clear to auscultation without wheezes, rhonchi, rales, rubs. Abdomen: Bowel sounds are normal. Abdomen is soft and nontender with no organomegaly, masses. GU: Deferred  Extremities:  No cyanosis, clubbing  Skin: Warm & dry w/o tenting. No significant lesions or rash.  See summary under each active problem in the Problem List with associated updated therapeutic plan

## 2020-07-18 NOTE — Patient Instructions (Signed)
See assessment and plan under each diagnosis in the problem list and acutely for this visit 

## 2020-07-18 NOTE — Assessment & Plan Note (Signed)
Full TFTs within normal limits.  Tachycardia has improved as anemia has improved.

## 2020-07-18 NOTE — Assessment & Plan Note (Signed)
Complicated diabetic regimen will be discussed with [facility] NP and deprescribing/weaning implemented.

## 2020-07-19 ENCOUNTER — Encounter: Payer: Self-pay | Admitting: Internal Medicine

## 2020-07-19 ENCOUNTER — Encounter: Payer: Self-pay | Admitting: Adult Health

## 2020-07-19 NOTE — Progress Notes (Deleted)
Location:  [place] Room Number: 458-P Place of Service:  SNF (31) Provider:  Durenda Age, DNP, FNP-BC  Patient Care Team: Charlott Rakes, MD as PCP - General (Family Medicine) Gala Romney Cristopher Estimable, MD as Consulting Physician (Gastroenterology)  Extended Emergency Contact Information Primary Emergency Contact: Bonner,Shirley Address: [address]          [place] [address] [place] Phone: [phone] Relation: Mother Secondary Emergency Contact: [name] Address: [place], [place] [address] [place] Phone: [phone] Relation: Significant other  Code Status:  FULL CODE  Goals of care: Advanced Directive information Advanced Directives 07/14/2020  Does Patient Have a Medical Advance Directive? Yes  Type of Advance Directive (No Data)  Does patient want to make changes to medical advance directive? No - Patient declined  Would patient like information on creating a medical advance directive? -     Chief Complaint  Patient presents with  . Medical Management of Chronic Issues    Short-term rehabilitation visit    HPI:  Pt is a 59 y.o. male seen today for medical management of chronic diseases.  He is a short-term rehabilitation resident of [facility]. He has a PMH of IDDM, essential HTN, dyslipidemia, and gout. ***   Past Medical History:  Diagnosis Date  . Gouty bursitis, elbow  10/18/2015  . Hyperlipidemia   . Hypertension   . Knee pain, chronic   . Type II diabetes mellitus ([place])    Past Surgical History:  Procedure Laterality Date  . KNEE ARTHROSCOPY Bilateral 12/29/2015   Procedure: ARTHROSCOPY KNEE BILATERAL WITH  PARTIAL MEDIAL AND LATERAL MENISCECTOMY AND FOUR COMPARTMENT SYNOVECTOMY , CHONDRALPLASTIES, PATELLA-FEMORAL CHONDRALPLASTIES BILATERALLY;  Surgeon: Dorna Leitz, MD;  Location: [facility];  Service: Orthopedics;   Laterality: Bilateral;    Allergies  Allergen Reactions  . Hctz [Hydrochlorothiazide]     Hx of gout; uric acid 14.1 on 03/13/20 Acute gout L elbow 8/11-8/24/21    Outpatient Encounter Medications as of 07/19/2020  Medication Sig  . allopurinol (ZYLOPRIM) 300 MG tablet Take 1 tablet (300 mg total) by mouth daily.  Marland Kitchen apixaban (ELIQUIS) 5 MG TABS tablet Take 1 tablet (5 mg total) by mouth 2 (two) times daily.  Marland Kitchen aspirin 81 MG EC tablet Take 1 tablet (81 mg total) by mouth daily.  Marland Kitchen atorvastatin (LIPITOR) 40 MG tablet Take 1 tablet (40 mg total) by mouth daily at 6 PM.  . cephALEXin (KEFLEX) 500 MG capsule Take 500 mg by mouth 4 (four) times daily.  . colchicine 0.6 MG tablet Take 1 tablet (0.6 mg total) by mouth 2 (two) times daily.  . cyclobenzaprine (FLEXERIL) 10 MG tablet Take 10 mg by mouth 3 (three) times daily. For Muscle Pain  . diclofenac Sodium (VOLTAREN) 1 % GEL Apply 2 g topically 4 (four) times daily.  . ferrous sulfate 325 (65 FE) MG tablet Take 325 mg by mouth daily with breakfast. For Iron Deficiency Anemia  . insulin glargine (LANTUS) 100 unit/mL SOPN Inject 0.15 mLs (15 Units total) into the skin 2 (two) times daily.  Marland Kitchen liraglutide (VICTOZA) 18 MG/3ML SOPN Inject 0.2 mLs (1.2 mg total) into the skin every morning.  . metFORMIN (GLUCOPHAGE) 500 MG tablet Take 2 tablets (1,000 mg total) by mouth 2 (two) times daily with a meal.  . metoCLOPramide (REGLAN) 5 MG/5ML solution Take 5 mg by mouth 3 (three)  times daily before meals.  . metoprolol succinate (TOPROL-XL) 50 MG 24 hr tablet Take 1 tablet (50 mg total) by mouth daily. Take with or immediately following a meal.  . NON FORMULARY Diet- Renal / CCD Regular Diet  . pantoprazole (PROTONIX) 40 MG tablet Take 1 tablet (40 mg total) by mouth at bedtime.  . polyethylene glycol (MIRALAX / GLYCOLAX) 17 g packet Take 17 g by mouth 2 (two) times daily.  . traZODone (DESYREL) 50 MG tablet Take 1 tablet (50 mg total) by mouth at bedtime.    . Accu-Chek FastClix Lancets MISC 1 each by Does not apply route 3 (three) times daily. as directed (Patient not taking: Reported on 07/18/2020)  . Alcohol Swabs (B-D SINGLE USE SWABS REGULAR) PADS Please use as instructed before checking blood sugar and before injecting insulin. (Patient not taking: Reported on 07/11/2020)  . Blood Glucose Calibration (ACCU-CHEK AVIVA) SOLN Use to calibrate your glucometer when needed. (Patient not taking: Reported on 07/11/2020)  . Blood Glucose Monitoring Suppl (ACCU-CHEK AVIVA PLUS) w/Device KIT USE AS INSTRUCTED THREE TIMES DAILY. E11.69 (Patient not taking: Reported on 07/11/2020)  . DROPLET PEN NEEDLES 32G X 4 MM MISC USE AS DIRECTED DAILY TO ADMINISTER VICTOZA (Patient not taking: Reported on 07/11/2020)  . glucose blood (ACCU-CHEK AVIVA PLUS) test strip USE AS INSTRUCTED THREE TIMES DAILY. E11.69 (Patient not taking: Reported on 07/11/2020)  . Insulin Pen Needle (PEN NEEDLES) 30G X 8 MM MISC Use as directed (Patient not taking: Reported on 07/11/2020)   No facility-administered encounter medications on file as of 07/19/2020.    Review of Systems  GENERAL: No change in appetite, no fatigue, no weight changes, no fever, chills or weakness SKIN: Denies rash, itching, wounds, ulcer sores, or nail abnormalities EYES: Denies change in vision, dry eyes, eye pain, itching or discharge EARS: Denies change in hearing, ringing in ears, or earache NOSE: Denies nasal congestion or epistaxis MOUTH and THROAT: Denies oral discomfort, gingival pain or bleeding, pain from teeth or hoarseness   RESPIRATORY: no cough, SOB, DOE, wheezing, hemoptysis CARDIAC: No chest pain, edema or palpitations GI: No abdominal pain, diarrhea, constipation, heart burn, nausea or vomiting GU: Denies dysuria, frequency, hematuria, incontinence, or discharge MUSCULOSKELETAL: Denies joint pain, muscle pain, back pain, restricted movement, or unusual weakness CIRCULATION: Denies claudication, edema  of legs, varicosities, or cold extremities NEUROLOGICAL: Denies dizziness, syncope, numbness, or headache PSYCHIATRIC: Denies feelings of depression or anxiety. No report of hallucinations, insomnia, paranoia, or agitation ENDOCRINE: Denies polyphagia, polyuria, polydipsia, heat or cold intolerance HEME/LYMPH: Denies excessive bruising, petechia, enlarged lymph nodes, or bleeding problems IMMUNOLOGIC: Denies history of frequent infections, AIDS, or use of immunosuppressive agents   Immunization History  Administered Date(s) Administered  . Influenza,inj,Quad PF,6+ Mos 10/19/2015, 08/11/2018  . Influenza-Unspecified 08/07/2017  . PFIZER SARS-COV-2 Vaccination 02/19/2020, 03/18/2020  . Pneumococcal Polysaccharide-23 10/10/2016  . Tdap 12/24/2018   Pertinent  Health Maintenance Due  Topic Date Due  . OPHTHALMOLOGY EXAM  Never done  . COLONOSCOPY  Never done  . URINE MICROALBUMIN  03/29/2020  . INFLUENZA VACCINE  06/18/2020  . FOOT EXAM  12/16/2020  . HEMOGLOBIN A1C  12/29/2020   Fall Risk  10/19/2019 06/29/2019 06/23/2018 10/13/2013  Falls in the past year? 0 0 No No     Vitals:   07/19/20 0849  BP: 108/76  Pulse: 80  Resp: 20  Temp: 98.6 F (37 C)  TempSrc: Oral  Weight: 233 lb (105.7 kg)  Height: '5\' 5"'  (1.651 m)  Body mass index is 38.77 kg/m.  Physical Exam  GENERAL APPEARANCE: Well nourished. In no acute distress. Normal body habitus SKIN:  Skin is warm and dry. There are no suspicious lesions or rash HEAD: Normal in size and contour. No evidence of trauma EYES: Lids open and close normally. No blepharitis, entropion or ectropion. PERRL. Conjunctivae are clear and sclerae are white. Lenses are without opacity EARS: Pinnae are normal. Patient hears normal voice tunes of the examiner MOUTH and THROAT: Lips are without lesions. Oral mucosa is moist and without lesions. Tongue is normal in shape, size, and color and without lesions NECK: supple, trachea midline, no neck  masses, no thyroid tenderness, no thyromegaly LYMPHATICS: No LAN in the neck, no supraclavicular LAN RESPIRATORY: Breathing is even & unlabored, BS CTAB CARDIAC: RRR, no murmur,no extra heart sounds, no edema GI: Abdomen soft, normal BS, no masses, no tenderness, no hepatomegaly, no splenomegaly MUSCULOSKELETAL: No deformities. Movement at each extremity is full and painless. Strength is 5/5 at each extremity. Back is without kyphosis or scoliosis CIRCULATION: Pedal pulses are 2+. There is no edema of the legs, ankles and feet NEUROLOGICAL: There is no tremor. Speech is clear PSYCHIATRIC: Alert and oriented X 3. Affect and behavior are appropriate  Labs reviewed: Recent Labs    07/08/20 0137 07/09/20 0144 07/10/20 0712 07/11/20 0327  NA 133* 135  --  133*  K 4.8 5.0  --  4.9  CL 102 105  --  102  CO2 21* 21*  --  20*  GLUCOSE 112* 98  --  91  BUN 46* 38*  --  19  CREATININE 1.38* 1.19  --  1.07  CALCIUM 8.9 8.7*  --  8.9  MG  --  1.9 1.6* 2.0  PHOS  --  3.1 2.9 2.8   Recent Labs    07/03/20 0122 07/09/20 0144 07/11/20 0327  AST 45* 24 20  ALT 45* 27 25  ALKPHOS 60 53 60  BILITOT 1.0 1.0 1.2  PROT 6.9 6.1* 6.1*  ALBUMIN 2.5* 2.5* 2.6*   Recent Labs    07/09/20 0144 07/09/20 0144 07/10/20 0712 07/11/20 0327 07/14/20 0000  WBC 13.4*   < > 13.7* 12.4* 8.4  NEUTROABS 10.4*  --  11.1* 9.7*  --   HGB 7.6*   < > 7.7* 7.7* 8.8*  HCT 24.3*   < > 24.8* 24.7* 27*  MCV 76.4*  --  78.2* 76.7*  --   PLT 298   < > 272 262 241   < > = values in this interval not displayed.   Lab Results  Component Value Date   TSH 1.71 07/14/2020   Lab Results  Component Value Date   HGBA1C 7.1 (H) 06/28/2020   Lab Results  Component Value Date   CHOL 114 03/13/2020   HDL 29 (L) 03/13/2020   LDLCALC 61 03/13/2020   TRIG 135 03/13/2020   CHOLHDL 3.9 03/13/2020    Significant Diagnostic Results in last 30 days:  DG Chest 1 View  Result Date: 06/28/2020 CLINICAL DATA:  Chest  pain. EXAM: CHEST  1 VIEW COMPARISON:  Chest x-ray dated June 15, 2011. FINDINGS: Borderline cardiomegaly. Normal pulmonary vascularity. Left basilar atelectasis. No focal consolidation, pleural effusion, or pneumothorax. No acute osseous abnormality. IMPRESSION: 1. Left basilar atelectasis. Electronically Signed   By: Titus Dubin M.D.   On: 06/28/2020 17:40   DG Elbow Complete Left  Result Date: 06/28/2020 CLINICAL DATA:  Left elbow pain and swelling after  physical therapy today. No injury. EXAM: LEFT ELBOW - COMPLETE 3+ VIEW COMPARISON:  None. FINDINGS: No acute fracture or dislocation. Large joint effusion. Joint spaces are preserved. Small marginal osteophytes. Posterior elbow soft tissue swelling. IMPRESSION: 1. Large joint effusion without acute osseous abnormality. In the absence of recent trauma, this could reflect underlying crystal arthropathy given history of gout. Electronically Signed   By: Titus Dubin M.D.   On: 06/28/2020 17:42   DG Wrist Complete Left  Result Date: 06/28/2020 CLINICAL DATA:  Left hand and wrist pain and swelling after physical therapy. No injury. EXAM: LEFT HAND - COMPLETE 3+ VIEW; LEFT WRIST - COMPLETE 3+ VIEW COMPARISON:  Left hand x-rays dated May 04, 2020. FINDINGS: No acute fracture or dislocation. Suspected large erosions involving the second and third proximal phalanx heads, third metacarpal head, and capitate are unchanged. Unchanged moderate third PIP joint and mild third MCP joint space narrowing. Remaining joint spaces are preserved. Bone mineralization is normal. New diffuse soft tissue swelling of the wrist, dorsal hand, and index finger. Progressive soft tissue swelling of the long finger. IMPRESSION: 1. Diffuse soft tissue swelling. No acute osseous abnormality. 2. 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 Electronically Signed   By: Titus Dubin M.D.   On: 06/28/2020 17:48   US  RENAL  Result Date: 06/28/2020 CLINICAL DATA:  Acute kidney injury EXAM: RENAL / URINARY TRACT ULTRASOUND COMPLETE COMPARISON:  None. FINDINGS: Right Kidney: Renal measurements: 10.0 x 5.6 x 3.8 cm = volume: 110 mL . Echogenicity within normal limits. No mass or hydronephrosis visualized. Left Kidney: Renal measurements: 9.6 x 5.7 x 3.7 cm = volume: 106 mL. Echogenicity within normal limits. No mass or hydronephrosis visualized. Bladder: Appears normal for degree of bladder distention. Other: None. IMPRESSION: No acute findings.  No hydronephrosis. Electronically Signed   By: Rolm Baptise M.D.   On: 06/28/2020 20:20   DG Shoulder Left  Result Date: 06/28/2020 CLINICAL DATA:  Left shoulder pain after physical therapy today. EXAM: LEFT SHOULDER - 2+ VIEW COMPARISON:  None. FINDINGS: No acute fracture or dislocation. Mild acromioclavicular osteoarthritis. Soft tissues are unremarkable. IMPRESSION: 1. No acute osseous abnormality. Electronically Signed   By: Titus Dubin M.D.   On: 06/28/2020 17:39   DG Hand Complete Left  Result Date: 06/28/2020 CLINICAL DATA:  Left hand and wrist pain and swelling after physical therapy. No injury. EXAM: LEFT HAND - COMPLETE 3+ VIEW; LEFT WRIST - COMPLETE 3+ VIEW COMPARISON:  Left hand x-rays dated May 04, 2020. FINDINGS: No acute fracture or dislocation. Suspected large erosions involving the second and third proximal phalanx heads, third metacarpal head, and capitate are unchanged. Unchanged moderate third PIP joint and mild third MCP joint space narrowing. Remaining joint spaces are preserved. Bone mineralization is normal. New diffuse soft tissue swelling of the wrist, dorsal hand, and index finger. Progressive soft tissue swelling of the long finger. IMPRESSION: 1. Diffuse soft tissue swelling. No acute osseous abnormality. 2. 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 Electronically Signed   By: Titus Dubin M.D.   On: 06/28/2020 17:48   VAS [place] UPPER EXTREMITY VENOUS DUPLEX  Result Date: 07/04/2020 UPPER VENOUS STUDY  Indications: Swelling Comparison Study: No prior studies. Performing Technologist: Darlin Coco  Examination Guidelines: A complete evaluation includes B-mode imaging, spectral Doppler, color Doppler, and power Doppler as needed of all accessible portions of each  vessel. Bilateral testing is considered an integral part of a complete examination. Limited examinations for reoccurring indications may be performed as noted.  Right Findings: +----------+------------+---------+-----------+----------+-------+ RIGHT     CompressiblePhasicitySpontaneousPropertiesSummary +----------+------------+---------+-----------+----------+-------+ Subclavian               Yes       Yes                      +----------+------------+---------+-----------+----------+-------+  Left Findings: +----------+------------+---------+-----------+----------+-----------------+ LEFT      CompressiblePhasicitySpontaneousProperties     Summary      +----------+------------+---------+-----------+----------+-----------------+ IJV           Full       Yes       Yes                                +----------+------------+---------+-----------+----------+-----------------+ Subclavian               Yes       Yes                                +----------+------------+---------+-----------+----------+-----------------+ Axillary      Full       Yes       Yes                                +----------+------------+---------+-----------+----------+-----------------+ Brachial      None       No        No               Age Indeterminate +----------+------------+---------+-----------+----------+-----------------+ Radial        Full                                                    +----------+------------+---------+-----------+----------+-----------------+ Ulnar         Full                                                     +----------+------------+---------+-----------+----------+-----------------+ Cephalic      Full                                                    +----------+------------+---------+-----------+----------+-----------------+ Basilic       Full                                                    +----------+------------+---------+-----------+----------+-----------------+  Summary:  Right: No evidence of thrombosis in the subclavian.  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  *See table(s) above for measurements and observations.  Diagnosing physician: Deitra Mayo MD Electronically signed by Deitra Mayo MD on 07/04/2020 at 7:59:24 AM.  Final     Assessment/Plan ***   Family/ staff Communication: ***  Labs/tests ordered:  ***  Goals of care:   Short-term rehabilitation.    Durenda Age, DNP, MSN, FNP-BC [facility] [phone] (Monday-Friday 8:00 a.m. - 5:00 p.m.) [phone] (after hours)

## 2020-07-19 NOTE — Progress Notes (Signed)
This encounter was created in error - please disregard.

## 2020-07-21 ENCOUNTER — Encounter: Payer: Self-pay | Admitting: Adult Health

## 2020-07-21 ENCOUNTER — Non-Acute Institutional Stay (SKILLED_NURSING_FACILITY): Payer: Medicare HMO | Admitting: Adult Health

## 2020-07-21 DIAGNOSIS — I82622 Acute embolism and thrombosis of deep veins of left upper extremity: Secondary | ICD-10-CM | POA: Diagnosis not present

## 2020-07-21 DIAGNOSIS — D509 Iron deficiency anemia, unspecified: Secondary | ICD-10-CM | POA: Diagnosis not present

## 2020-07-21 DIAGNOSIS — L089 Local infection of the skin and subcutaneous tissue, unspecified: Secondary | ICD-10-CM | POA: Diagnosis not present

## 2020-07-21 DIAGNOSIS — M109 Gout, unspecified: Secondary | ICD-10-CM

## 2020-07-21 DIAGNOSIS — E1149 Type 2 diabetes mellitus with other diabetic neurological complication: Secondary | ICD-10-CM | POA: Diagnosis not present

## 2020-07-21 DIAGNOSIS — K5901 Slow transit constipation: Secondary | ICD-10-CM | POA: Diagnosis not present

## 2020-07-21 NOTE — Progress Notes (Signed)
Location:  [facility] Room Number: 998-P Place of Service:  SNF (31) Provider:  Durenda Age, DNP, FNP-BC  Patient Care Team: Charlott Rakes, MD as PCP - General (Family Medicine) Gala Romney Cristopher Estimable, MD as Consulting Physician (Gastroenterology)  Extended Emergency Contact Information Primary Emergency Contact: Bonner,Shirley Address: [address]           [address] [place] Phone: [phone] Relation: Mother Secondary Emergency Contact: [name] Address: [address] [place], [place] [address] [place] Phone: [phone] Relation: Significant other  Code Status:  FULL CODE   Goals of care: Advanced Directive information Advanced Directives 07/14/2020  Does Patient Have a Medical Advance Directive? Yes  Type of Advance Directive (No Data)  Does patient want to make changes to medical advance directive? No - Patient declined  Would patient like information on creating a medical advance directive? -     Chief Complaint  Patient presents with   Medical Management of Chronic Issues    Short-term rehabilitation visit    HPI:  Pt is a 59 y.o. male seen today for medical management of chronic diseases.  He is a short-term care resident of [facility].  He has a PMH of IDDM, essential hypertension, dyslipidemia and gout.  He was seen in his room today.  He was recently started on MiraLAX 17 g BID. He stated that he has been moving his bowels daily.  He denies having any abdominal pain.  CBGs ranging from 60-133.  Lantus dose was recently decreased from 15 units BID to HS only.  He, also, takes Victoza 1.2 mg injection every morning and Metformin 1000 mg twice a day for diabetes mellitus.  Latest hemoglobin 8.8 ( taken 07/14/2020), improved from 7.7.   He was admitted to [facility] on 07/10/2020 post hospitalization 06/28/2020 to  07/11/2020 due to AKI, hyperkalemia and rhabdomyolysis.  Aggressive hydration was done and his creatinine improved from 6.38 to 2.69.  Orthopedics was consulted for his left elbow swelling and elbow joint effusion.  He was started on steroids and colchicine for the gouty flareup of the multiple joints.  Guaiac was positive for stools.  GI consulted, recommended outpatient colonoscopy and EGD.  Due to persistent left upper extremity pain, venous duplex of the left upper extremity was done and was positive for DVT.  He was a started on IV Heparin but was held due to right sided nostril bleeding.  ENT was consulted and was found to have a clot in the right nostril which was suctioned out.  Nostril bleeding stopped and was restarted on Heparin.  He was then transitioned to Eliquis.   Past Medical History:  Diagnosis Date   Gouty bursitis, elbow  10/18/2015   Hyperlipidemia    Hypertension    Knee pain, chronic    Type II diabetes mellitus ([place])    Past Surgical History:  Procedure Laterality Date   COLONOSCOPY     KNEE ARTHROSCOPY Bilateral 12/29/2015   Procedure: ARTHROSCOPY KNEE BILATERAL WITH  PARTIAL MEDIAL AND LATERAL MENISCECTOMY AND FOUR COMPARTMENT SYNOVECTOMY , [place], PATELLA-FEMORAL CHONDRALPLASTIES BILATERALLY;  Surgeon: Dorna Leitz, MD;  Location: [facility];  Service: Orthopedics;  Laterality: Bilateral;    Allergies  Allergen Reactions   Hctz [Hydrochlorothiazide]     Hx of gout; uric acid 14.1 on 03/13/20 Acute gout L elbow 8/11-8/24/21    Outpatient Encounter Medications  as of 07/21/2020  Medication Sig   allopurinol (ZYLOPRIM) 300 MG tablet Take 1 tablet (300 mg total) by mouth daily.   apixaban (ELIQUIS) 5 MG TABS tablet Take 1 tablet (5 mg total) by mouth 2 (two) times daily.   aspirin 81 MG EC tablet Take 1 tablet (81 mg total) by mouth daily.   atorvastatin (LIPITOR) 40 MG tablet Take 1 tablet (40 mg total) by mouth daily at 6 PM.   cephALEXin (KEFLEX)  500 MG capsule Take 500 mg by mouth 4 (four) times daily.   colchicine 0.6 MG tablet Take 1 tablet (0.6 mg total) by mouth 2 (two) times daily.   cyclobenzaprine (FLEXERIL) 10 MG tablet Take 10 mg by mouth 3 (three) times daily. For Muscle Pain   diclofenac Sodium (VOLTAREN) 1 % GEL Apply 2 g topically 4 (four) times daily.   ferrous sulfate 325 (65 FE) MG tablet Take 325 mg by mouth daily with breakfast. For Iron Deficiency Anemia   insulin glargine (LANTUS) 100 unit/mL SOPN Inject 0.15 mLs (15 Units total) into the skin 2 (two) times daily.   liraglutide (VICTOZA) 18 MG/3ML SOPN Inject 0.2 mLs (1.2 mg total) into the skin every morning.   metFORMIN (GLUCOPHAGE) 500 MG tablet Take 2 tablets (1,000 mg total) by mouth 2 (two) times daily with a meal.   metoCLOPramide (REGLAN) 5 MG/5ML solution Take 5 mg by mouth 3 (three) times daily before meals.   metoprolol succinate (TOPROL-XL) 50 MG 24 hr tablet Take 1 tablet (50 mg total) by mouth daily. Take with or immediately following a meal.   NON FORMULARY Diet- Renal / CCD Regular Diet   pantoprazole (PROTONIX) 40 MG tablet Take 1 tablet (40 mg total) by mouth at bedtime.   polyethylene glycol (MIRALAX / GLYCOLAX) 17 g packet Take 17 g by mouth 2 (two) times daily.   traZODone (DESYREL) 50 MG tablet Take 1 tablet (50 mg total) by mouth at bedtime.   Accu-Chek FastClix Lancets MISC 1 each by Does not apply route 3 (three) times daily. as directed (Patient not taking: Reported on 07/18/2020)   Alcohol Swabs (B-D SINGLE USE SWABS REGULAR) PADS Please use as instructed before checking blood sugar and before injecting insulin. (Patient not taking: Reported on 07/11/2020)   Blood Glucose Calibration (ACCU-CHEK AVIVA) SOLN Use to calibrate your glucometer when needed. (Patient not taking: Reported on 07/11/2020)   Blood Glucose Monitoring Suppl (ACCU-CHEK AVIVA PLUS) w/Device KIT USE AS INSTRUCTED THREE TIMES DAILY. E11.69 (Patient not taking:  Reported on 07/11/2020)   DROPLET PEN NEEDLES 32G X 4 MM MISC USE AS DIRECTED DAILY TO ADMINISTER VICTOZA (Patient not taking: Reported on 07/11/2020)   glucose blood (ACCU-CHEK AVIVA PLUS) test strip USE AS INSTRUCTED THREE TIMES DAILY. E11.69 (Patient not taking: Reported on 07/11/2020)   Insulin Pen Needle (PEN NEEDLES) 30G X 8 MM MISC Use as directed (Patient not taking: Reported on 07/11/2020)   No facility-administered encounter medications on file as of 07/21/2020.    Review of Systems  GENERAL: No change in appetite, no fatigue, no weight changes, no fever, chills or weakness MOUTH and THROAT: Denies oral discomfort, gingival pain or bleeding RESPIRATORY: no cough, SOB, DOE, wheezing, hemoptysis CARDIAC: No chest pain or palpitations GI: No abdominal pain, diarrhea, constipation, heart burn, nausea or vomiting GU: Denies dysuria, frequency, hematuria, incontinence, or discharge NEUROLOGICAL: Denies dizziness, syncope, numbness, or headache PSYCHIATRIC: Denies feelings of depression or anxiety. No report of hallucinations, insomnia, paranoia, or agitation  Immunization History  Administered Date(s) Administered   Influenza,inj,Quad PF,6+ Mos 10/19/2015, 08/11/2018   Influenza-Unspecified 08/07/2017   PFIZER SARS-COV-2 Vaccination 02/19/2020, 03/18/2020   Pneumococcal Polysaccharide-23 10/10/2016   Tdap 12/24/2018   Pertinent  Health Maintenance Due  Topic Date Due   OPHTHALMOLOGY EXAM  Never done   COLONOSCOPY  Never done   URINE MICROALBUMIN  03/29/2020   INFLUENZA VACCINE  06/18/2020   FOOT EXAM  12/16/2020   HEMOGLOBIN A1C  12/29/2020   Fall Risk  10/19/2019 06/29/2019 06/23/2018 10/13/2013  Falls in the past year? 0 0 No No     Vitals:   07/21/20 1230  BP: 134/78  Pulse: 68  Resp: 20  Temp: 98.6 F (37 C)  TempSrc: Oral  Weight: 233 lb (105.7 kg)  Height: _0  (1.651 m)   Body mass index is 38.77 kg/m.  Physical Exam  GENERAL APPEARANCE: Well  nourished. In no acute distress.  Morbidly obese  SKIN:  Skin is warm and dry.  MOUTH and THROAT: Lips are without lesions. Oral mucosa is moist and without lesions. Tongue is normal in shape, size, and color and without lesions RESPIRATORY: Breathing is even & unlabored, BS CTAB CARDIAC: RRR, no murmur,no extra heart sounds GI: Abdomen soft, normal BS, no masses, no tenderness EXTREMITIES: Able to move x4 extremities NEUROLOGICAL: There is no tremor. Speech is clear. Alert and oriented X 3. PSYCHIATRIC:  Affect and behavior are appropriate  Labs reviewed: Recent Labs    07/08/20 0137 07/09/20 0144 07/10/20 0712 07/11/20 0327  NA 133* 135  --  133*  K 4.8 5.0  --  4.9  CL 102 105  --  102  CO2 21* 21*  --  20*  GLUCOSE 112* 98  --  91  BUN 46* 38*  --  19  CREATININE 1.38* 1.19  --  1.07  CALCIUM 8.9 8.7*  --  8.9  MG  --  1.9 1.6* 2.0  PHOS  --  3.1 2.9 2.8   Recent Labs    07/03/20 0122 07/09/20 0144 07/11/20 0327  AST 45* 24 20  ALT 45* 27 25  ALKPHOS 60 53 60  BILITOT 1.0 1.0 1.2  PROT 6.9 6.1* 6.1*  ALBUMIN 2.5* 2.5* 2.6*   Recent Labs    07/09/20 0144 07/09/20 0144 07/10/20 0712 07/11/20 0327 07/14/20 0000  WBC 13.4*   < > 13.7* 12.4* 8.4  NEUTROABS 10.4*  --  11.1* 9.7*  --   HGB 7.6*   < > 7.7* 7.7* 8.8*  HCT 24.3*   < > 24.8* 24.7* 27*  MCV 76.4*  --  78.2* 76.7*  --   PLT 298   < > 272 262 241   < > = values in this interval not displayed.   Lab Results  Component Value Date   TSH 1.71 07/14/2020   Lab Results  Component Value Date   HGBA1C 7.1 (H) 06/28/2020   Lab Results  Component Value Date   CHOL 114 03/13/2020   HDL 29 (L) 03/13/2020   LDLCALC 61 03/13/2020   TRIG 135 03/13/2020   CHOLHDL 3.9 03/13/2020    Significant Diagnostic Results in last 30 days:  DG Chest 1 View  Result Date: 06/28/2020 CLINICAL DATA:  Chest pain. EXAM: CHEST  1 VIEW COMPARISON:  Chest x-ray dated June 15, 2011. FINDINGS: Borderline cardiomegaly.  Normal pulmonary vascularity. Left basilar atelectasis. No focal consolidation, pleural effusion, or pneumothorax. No acute osseous abnormality. IMPRESSION: 1. Left basilar atelectasis.  Electronically Signed   By: Titus Dubin M.D.   On: 06/28/2020 17:40   DG Elbow Complete Left  Result Date: 06/28/2020 CLINICAL DATA:  Left elbow pain and swelling after physical therapy today. No injury. EXAM: LEFT ELBOW - COMPLETE 3+ VIEW COMPARISON:  None. FINDINGS: No acute fracture or dislocation. Large joint effusion. Joint spaces are preserved. Small marginal osteophytes. Posterior elbow soft tissue swelling. IMPRESSION: 1. Large joint effusion without acute osseous abnormality. In the absence of recent trauma, this could reflect underlying crystal arthropathy given history of gout. Electronically Signed   By: Titus Dubin M.D.   On: 06/28/2020 17:42   DG Wrist Complete Left  Result Date: 06/28/2020 CLINICAL DATA:  Left hand and wrist pain and swelling after physical therapy. No injury. EXAM: LEFT HAND - COMPLETE 3+ VIEW; LEFT WRIST - COMPLETE 3+ VIEW COMPARISON:  Left hand x-rays dated May 04, 2020. FINDINGS: No acute fracture or dislocation. Suspected large erosions involving the second and third proximal phalanx heads, third metacarpal head, and capitate are unchanged. Unchanged moderate third PIP joint and mild third MCP joint space narrowing. Remaining joint spaces are preserved. Bone mineralization is normal. New diffuse soft tissue swelling of the wrist, dorsal hand, and index finger. Progressive soft tissue swelling of the long finger. IMPRESSION: 1. Diffuse soft tissue swelling. No acute osseous abnormality. 2. 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 Electronically Signed   By: Titus Dubin M.D.   On: 06/28/2020 17:48   US RENAL  Result Date: 06/28/2020 CLINICAL DATA:  Acute kidney injury EXAM: RENAL / URINARY TRACT ULTRASOUND  COMPLETE COMPARISON:  None. FINDINGS: Right Kidney: Renal measurements: 10.0 x 5.6 x 3.8 cm = volume: 110 mL . Echogenicity within normal limits. No mass or hydronephrosis visualized. Left Kidney: Renal measurements: 9.6 x 5.7 x 3.7 cm = volume: 106 mL. Echogenicity within normal limits. No mass or hydronephrosis visualized. Bladder: Appears normal for degree of bladder distention. Other: None. IMPRESSION: No acute findings.  No hydronephrosis. Electronically Signed   By: Rolm Baptise M.D.   On: 06/28/2020 20:20   DG Shoulder Left  Result Date: 06/28/2020 CLINICAL DATA:  Left shoulder pain after physical therapy today. EXAM: LEFT SHOULDER - 2+ VIEW COMPARISON:  None. FINDINGS: No acute fracture or dislocation. Mild acromioclavicular osteoarthritis. Soft tissues are unremarkable. IMPRESSION: 1. No acute osseous abnormality. Electronically Signed   By: Titus Dubin M.D.   On: 06/28/2020 17:39   DG Hand Complete Left  Result Date: 06/28/2020 CLINICAL DATA:  Left hand and wrist pain and swelling after physical therapy. No injury. EXAM: LEFT HAND - COMPLETE 3+ VIEW; LEFT WRIST - COMPLETE 3+ VIEW COMPARISON:  Left hand x-rays dated May 04, 2020. FINDINGS: No acute fracture or dislocation. Suspected large erosions involving the second and third proximal phalanx heads, third metacarpal head, and capitate are unchanged. Unchanged moderate third PIP joint and mild third MCP joint space narrowing. Remaining joint spaces are preserved. Bone mineralization is normal. New diffuse soft tissue swelling of the wrist, dorsal hand, and index finger. Progressive soft tissue swelling of the long finger. IMPRESSION: 1. Diffuse soft tissue swelling. No acute osseous abnormality. 2. 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 Electronically Signed   By: Titus Dubin M.D.   On: 06/28/2020 17:48   VAS [place] UPPER EXTREMITY VENOUS DUPLEX  Result Date:  07/04/2020 UPPER VENOUS STUDY  Indications: Swelling  Comparison Study: No prior studies. Performing Technologist: Darlin Coco  Examination Guidelines: A complete evaluation includes B-mode imaging, spectral Doppler, color Doppler, and power Doppler as needed of all accessible portions of each vessel. Bilateral testing is considered an integral part of a complete examination. Limited examinations for reoccurring indications may be performed as noted.  Right Findings: +----------+------------+---------+-----------+----------+-------+  RIGHT      Compressible Phasicity Spontaneous Properties Summary  +----------+------------+---------+-----------+----------+-------+  Subclavian                 Yes        Yes                         +----------+------------+---------+-----------+----------+-------+  Left Findings: +----------+------------+---------+-----------+----------+-----------------+  LEFT       Compressible Phasicity Spontaneous Properties      Summary       +----------+------------+---------+-----------+----------+-----------------+  IJV            Full        Yes        Yes                                   +----------+------------+---------+-----------+----------+-----------------+  Subclavian                 Yes        Yes                                   +----------+------------+---------+-----------+----------+-----------------+  Axillary       Full        Yes        Yes                                   +----------+------------+---------+-----------+----------+-----------------+  Brachial       None        No         No                 Age Indeterminate  +----------+------------+---------+-----------+----------+-----------------+  Radial         Full                                                         +----------+------------+---------+-----------+----------+-----------------+  Ulnar          Full                                                          +----------+------------+---------+-----------+----------+-----------------+  Cephalic       Full                                                         +----------+------------+---------+-----------+----------+-----------------+  Basilic  Full                                                         +----------+------------+---------+-----------+----------+-----------------+  Summary:  Right: No evidence of thrombosis in the subclavian.  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  *See table(s) above for measurements and observations.  Diagnosing physician: Deitra Mayo MD Electronically signed by Deitra Mayo MD on 07/04/2020 at 7:59:24 AM.    Final     Assessment/Plan  1. DM (diabetes mellitus), type 2 with neurological complications (HCC) Lab Results  Component Value Date   HGB 8.8 (A) 07/14/2020   -  Decrease Metformin from 1000 mg BID to 500 mg BID -  Lantus 15 units at bedtime and Victoza 1.2 mg injection in the morning -  Monitor CBGs  2. Microcytic anemia Lab Results  Component Value Date   HGB 8.8 (A) 07/14/2020   -   Continue ferrous sulfate 325 mg daily  3. Acute deep vein thrombosis (DVT) of left upper extremity, unspecified vein (HCC) -Continue Eliquis 5 mg BID  4. Gouty bursitis, elbow  -Continue colchicine 0.6 mg BID and allopurinol 300 mg 1 tab daily  5. Skin infection -  Right elbow now dry and no erythema -Continue cephalexin 500 mg QID for a total of 7 days  6. Slow transit constipation -Stated having BM today, continue MiraLAX 17 g BID     Family/ staff Communication: Discussed plan of care with resident and charge nurse.  Labs/tests ordered: None  Goals of care:   Short-term care   Durenda Age, DNP, MSN, FNP-BC [facility] [phone] (Monday-Friday 8:00 a.m. - 5:00 p.m.) [phone] (after hours)

## 2020-07-22 LAB — BASIC METABOLIC PANEL
BUN: 42 — AB (ref 4–21)
CO2: 19 (ref 13–22)
Chloride: 105 (ref 99–108)
Creatinine: 2.6 — AB (ref 0.6–1.3)
Glucose: 78
Potassium: 4.4 (ref 3.4–5.3)
Sodium: 133 — AB (ref 137–147)

## 2020-07-22 LAB — CBC AND DIFFERENTIAL
HCT: 27 — AB (ref 41–53)
Hemoglobin: 8.6 — AB (ref 13.5–17.5)
Neutrophils Absolute: 2
Platelets: 194 (ref 150–399)
WBC: 4.3

## 2020-07-22 LAB — COMPREHENSIVE METABOLIC PANEL
Calcium: 8.8 (ref 8.7–10.7)
GFR calc Af Amer: 29
GFR calc non Af Amer: 25

## 2020-07-22 LAB — CBC: RBC: 3.49 — AB (ref 3.87–5.11)

## 2020-07-23 LAB — BASIC METABOLIC PANEL
BUN: 36 — AB (ref 4–21)
CO2: 19 (ref 13–22)
Chloride: 102 (ref 99–108)
Creatinine: 1.9 — AB (ref 0.6–1.3)
Glucose: 128
Potassium: 4.4 (ref 3.4–5.3)
Sodium: 131 — AB (ref 137–147)

## 2020-07-23 LAB — COMPREHENSIVE METABOLIC PANEL
Calcium: 8.8 (ref 8.7–10.7)
GFR calc Af Amer: 44
GFR calc non Af Amer: 10

## 2020-07-28 ENCOUNTER — Non-Acute Institutional Stay (SKILLED_NURSING_FACILITY): Payer: Medicare HMO | Admitting: Adult Health

## 2020-07-28 ENCOUNTER — Encounter: Payer: Self-pay | Admitting: Adult Health

## 2020-07-28 DIAGNOSIS — D509 Iron deficiency anemia, unspecified: Secondary | ICD-10-CM

## 2020-07-28 DIAGNOSIS — K5901 Slow transit constipation: Secondary | ICD-10-CM | POA: Diagnosis not present

## 2020-07-28 DIAGNOSIS — E1149 Type 2 diabetes mellitus with other diabetic neurological complication: Secondary | ICD-10-CM | POA: Diagnosis not present

## 2020-07-28 DIAGNOSIS — I82622 Acute embolism and thrombosis of deep veins of left upper extremity: Secondary | ICD-10-CM | POA: Diagnosis not present

## 2020-07-28 DIAGNOSIS — M109 Gout, unspecified: Secondary | ICD-10-CM | POA: Diagnosis not present

## 2020-07-28 NOTE — Progress Notes (Signed)
Location:  [facility] Room Number: 115-A Place of Service:  SNF (31) Provider:  Durenda Age, DNP, FNP-BC  Patient Care Team: Charlott Rakes, MD as PCP - General (Family Medicine) Gala Romney Cristopher Estimable, MD as Consulting Physician (Gastroenterology)  Extended Emergency Contact Information Primary Emergency Contact: Bonner,Shirley Address: [address]          [place] [address] [place] Phone: [phone] Relation: Mother Secondary Emergency Contact: [name] Address: [place], [place] [address] [place] Phone: [phone] Relation: Significant other  Code Status:  FULL CODE  Goals of care: Advanced Directive information Advanced Directives 07/14/2020  Does Patient Have a Medical Advance Directive? Yes  Type of Advance Directive (No Data)  Does patient want to make changes to medical advance directive? No - Patient declined  Would patient like information on creating a medical advance directive? -     Chief Complaint  Patient presents with  . Medical Management of Chronic Issues    Short-term rehabilitation visit    HPI:  Pt is a 59 y.o. male seen today for medical management of chronic diseases.  He is a short-term care resident of [facility].  He has IDDM, essential hypertension, dyslipidemia and gout.  He was seen in the room today.  CBGs has been ranging from 96-188.  He is not on any diabetic medication.  Victoza, Lantus and Metformin were discontinued due to hypoglycemia.  He stated that he has been moving his bowels and does not have any abdominal pain.  He denies any pain.  He currently takes colchicine 0.6 mg twice a day and allopurinol 300 daily for gout.  He takes MiraLAX 17 g twice a day for constipation.  Latest hemoglobin 8.6, 07/22/20.  He takes ferrous sulfate 325 mg daily for anemia.   Past Medical History:  Diagnosis Date    . Gouty bursitis, elbow  10/18/2015  . Hyperlipidemia   . Hypertension   . Knee pain, chronic   . Type II diabetes mellitus ([place])    Past Surgical History:  Procedure Laterality Date  . COLONOSCOPY    . KNEE ARTHROSCOPY Bilateral 12/29/2015   Procedure: ARTHROSCOPY KNEE BILATERAL WITH  PARTIAL MEDIAL AND LATERAL MENISCECTOMY AND FOUR COMPARTMENT SYNOVECTOMY , CHONDRALPLASTIES, PATELLA-FEMORAL CHONDRALPLASTIES BILATERALLY;  Surgeon: Dorna Leitz, MD;  Location: [facility];  Service: Orthopedics;  Laterality: Bilateral;    Allergies  Allergen Reactions  . Hctz [Hydrochlorothiazide]     Hx of gout; uric acid 14.1 on 03/13/20 Acute gout L elbow 8/11-8/24/21    Outpatient Encounter Medications as of 07/28/2020  Medication Sig  . Accu-Chek FastClix Lancets MISC 1 each by Does not apply route 3 (three) times daily. as directed (Patient not taking: Reported on 07/18/2020)  . Alcohol Swabs (B-D SINGLE USE SWABS REGULAR) PADS Please use as instructed before checking blood sugar and before injecting insulin. (Patient not taking: Reported on 07/11/2020)  . allopurinol (ZYLOPRIM) 300 MG tablet Take 1 tablet (300 mg total) by mouth daily.  Marland Kitchen apixaban (ELIQUIS) 5 MG TABS tablet Take 1 tablet (5 mg total) by mouth 2 (two) times daily.  Marland Kitchen aspirin 81 MG EC tablet Take 1 tablet (81 mg total) by mouth daily.  Marland Kitchen atorvastatin (LIPITOR) 40 MG tablet Take 1 tablet (40 mg total) by mouth daily at 6 PM.  . Blood Glucose Calibration (ACCU-CHEK AVIVA) SOLN Use to calibrate your  glucometer when needed. (Patient not taking: Reported on 07/11/2020)  . Blood Glucose Monitoring Suppl (ACCU-CHEK AVIVA PLUS) w/Device KIT USE AS INSTRUCTED THREE TIMES DAILY. E11.69 (Patient not taking: Reported on 07/11/2020)  . colchicine 0.6 MG tablet Take 1 tablet (0.6 mg total) by mouth 2 (two) times daily.  . cyclobenzaprine (FLEXERIL) 10 MG tablet Take 10 mg by mouth 3 (three) times daily. For Muscle Pain  . diclofenac Sodium (VOLTAREN) 1 %  GEL Apply 2 g topically 4 (four) times daily.  . DROPLET PEN NEEDLES 32G X 4 MM MISC USE AS DIRECTED DAILY TO ADMINISTER VICTOZA (Patient not taking: Reported on 07/11/2020)  . ferrous sulfate 325 (65 FE) MG tablet Take 325 mg by mouth daily with breakfast. For Iron Deficiency Anemia  . glucose blood (ACCU-CHEK AVIVA PLUS) test strip USE AS INSTRUCTED THREE TIMES DAILY. E11.69 (Patient not taking: Reported on 07/11/2020)  . insulin glargine (LANTUS) 100 unit/mL SOPN Inject 0.15 mLs (15 Units total) into the skin 2 (two) times daily.  . Insulin Pen Needle (PEN NEEDLES) 30G X 8 MM MISC Use as directed (Patient not taking: Reported on 07/11/2020)  . liraglutide (VICTOZA) 18 MG/3ML SOPN Inject 0.2 mLs (1.2 mg total) into the skin every morning.  . metFORMIN (GLUCOPHAGE) 500 MG tablet Take 2 tablets (1,000 mg total) by mouth 2 (two) times daily with a meal.  . metoCLOPramide (REGLAN) 5 MG/5ML solution Take 5 mg by mouth 3 (three) times daily before meals.  . metoprolol succinate (TOPROL-XL) 50 MG 24 hr tablet Take 1 tablet (50 mg total) by mouth daily. Take with or immediately following a meal.  . NON FORMULARY Diet- Renal / CCD Regular Diet  . pantoprazole (PROTONIX) 40 MG tablet Take 1 tablet (40 mg total) by mouth at bedtime.  . polyethylene glycol (MIRALAX / GLYCOLAX) 17 g packet Take 17 g by mouth 2 (two) times daily.  . traZODone (DESYREL) 50 MG tablet Take 1 tablet (50 mg total) by mouth at bedtime.   No facility-administered encounter medications on file as of 07/28/2020.    Review of Systems  GENERAL: No change in appetite, no fatigue, no weight changes, no fever, chills or weakness MOUTH and THROAT: Denies oral discomfort, gingival pain or bleeding RESPIRATORY: no cough, SOB, DOE, wheezing, hemoptysis CARDIAC: No chest pain or palpitations GI: No abdominal pain, diarrhea, constipation, heart burn, nausea or vomiting GU: Denies dysuria, frequency, hematuria, incontinence, or  discharge NEUROLOGICAL: Denies dizziness, syncope, numbness, or headache PSYCHIATRIC: Denies feelings of depression or anxiety. No report of hallucinations, insomnia, paranoia, or agitation   Immunization History  Administered Date(s) Administered  . Influenza,inj,Quad PF,6+ Mos 10/19/2015, 08/11/2018  . Influenza-Unspecified 08/07/2017  . PFIZER SARS-COV-2 Vaccination 02/19/2020, 03/18/2020  . Pneumococcal Polysaccharide-23 10/10/2016  . Tdap 12/24/2018   Pertinent  Health Maintenance Due  Topic Date Due  . OPHTHALMOLOGY EXAM  Never done  . COLONOSCOPY  Never done  . URINE MICROALBUMIN  03/29/2020  . INFLUENZA VACCINE  06/18/2020  . FOOT EXAM  12/16/2020  . HEMOGLOBIN A1C  12/29/2020   Fall Risk  10/19/2019 06/29/2019 06/23/2018 10/13/2013  Falls in the past year? 0 0 No No     Vitals:   07/28/20 1018  BP: 125/71  Pulse: 87  Resp: 18  Temp: (!) 97.3 F (36.3 C)  TempSrc: Oral  Weight: 222 lb 9.6 oz (101 kg)  Height: _0  (1.651 m)   Body mass index is 37.04 kg/m.  Physical Exam  GENERAL APPEARANCE: Well nourished.  In no acute distress. Obese SKIN:  Skin is warm and dry.  MOUTH and THROAT: Lips are without lesions. Oral mucosa is moist and without lesions. Tongue is normal in shape, size, and color and without lesions RESPIRATORY: Breathing is even & unlabored, BS CTAB CARDIAC: RRR, no murmur,no extra heart sounds,bilateral ankle 1+ edema GI: Abdomen soft, normal BS, no masses, no tenderness EXTREMITIES: Able to move x4 extremities.  Walks with walker.  Alert and oriented X 3. NEUROLOGICAL: There is no tremor. Speech is clear PSYCHIATRIC: Affect and behavior are appropriate  Labs reviewed: Recent Labs    07/08/20 0137 07/09/20 0144 07/10/20 0712 07/11/20 0327  NA 133* 135  --  133*  K 4.8 5.0  --  4.9  CL 102 105  --  102  CO2 21* 21*  --  20*  GLUCOSE 112* 98  --  91  BUN 46* 38*  --  19  CREATININE 1.38* 1.19  --  1.07  CALCIUM 8.9 8.7*  --  8.9  MG   --  1.9 1.6* 2.0  PHOS  --  3.1 2.9 2.8   Recent Labs    07/03/20 0122 07/09/20 0144 07/11/20 0327  AST 45* 24 20  ALT 45* 27 25  ALKPHOS 60 53 60  BILITOT 1.0 1.0 1.2  PROT 6.9 6.1* 6.1*  ALBUMIN 2.5* 2.5* 2.6*   Recent Labs    07/09/20 0144 07/09/20 0144 07/10/20 0712 07/11/20 0327 07/14/20 0000  WBC 13.4*   < > 13.7* 12.4* 8.4  NEUTROABS 10.4*  --  11.1* 9.7*  --   HGB 7.6*   < > 7.7* 7.7* 8.8*  HCT 24.3*   < > 24.8* 24.7* 27*  MCV 76.4*  --  78.2* 76.7*  --   PLT 298   < > 272 262 241   < > = values in this interval not displayed.   Lab Results  Component Value Date   TSH 1.71 07/14/2020   Lab Results  Component Value Date   HGBA1C 7.1 (H) 06/28/2020   Lab Results  Component Value Date   CHOL 114 03/13/2020   HDL 29 (L) 03/13/2020   LDLCALC 61 03/13/2020   TRIG 135 03/13/2020   CHOLHDL 3.9 03/13/2020    Significant Diagnostic Results in last 30 days:  DG Chest 1 View  Result Date: 06/28/2020 CLINICAL DATA:  Chest pain. EXAM: CHEST  1 VIEW COMPARISON:  Chest x-ray dated June 15, 2011. FINDINGS: Borderline cardiomegaly. Normal pulmonary vascularity. Left basilar atelectasis. No focal consolidation, pleural effusion, or pneumothorax. No acute osseous abnormality. IMPRESSION: 1. Left basilar atelectasis. Electronically Signed   By: Titus Dubin M.D.   On: 06/28/2020 17:40   DG Elbow Complete Left  Result Date: 06/28/2020 CLINICAL DATA:  Left elbow pain and swelling after physical therapy today. No injury. EXAM: LEFT ELBOW - COMPLETE 3+ VIEW COMPARISON:  None. FINDINGS: No acute fracture or dislocation. Large joint effusion. Joint spaces are preserved. Small marginal osteophytes. Posterior elbow soft tissue swelling. IMPRESSION: 1. Large joint effusion without acute osseous abnormality. In the absence of recent trauma, this could reflect underlying crystal arthropathy given history of gout. Electronically Signed   By: Titus Dubin M.D.   On: 06/28/2020 17:42    DG Wrist Complete Left  Result Date: 06/28/2020 CLINICAL DATA:  Left hand and wrist pain and swelling after physical therapy. No injury. EXAM: LEFT HAND - COMPLETE 3+ VIEW; LEFT WRIST - COMPLETE 3+ VIEW COMPARISON:  Left hand x-rays  dated May 04, 2020. FINDINGS: No acute fracture or dislocation. Suspected large erosions involving the second and third proximal phalanx heads, third metacarpal head, and capitate are unchanged. Unchanged moderate third PIP joint and mild third MCP joint space narrowing. Remaining joint spaces are preserved. Bone mineralization is normal. New diffuse soft tissue swelling of the wrist, dorsal hand, and index finger. Progressive soft tissue swelling of the long finger. IMPRESSION: 1. Diffuse soft tissue swelling. No acute osseous abnormality. 2. 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 Electronically Signed   By: Titus Dubin M.D.   On: 06/28/2020 17:48   US RENAL  Result Date: 06/28/2020 CLINICAL DATA:  Acute kidney injury EXAM: RENAL / URINARY TRACT ULTRASOUND COMPLETE COMPARISON:  None. FINDINGS: Right Kidney: Renal measurements: 10.0 x 5.6 x 3.8 cm = volume: 110 mL . Echogenicity within normal limits. No mass or hydronephrosis visualized. Left Kidney: Renal measurements: 9.6 x 5.7 x 3.7 cm = volume: 106 mL. Echogenicity within normal limits. No mass or hydronephrosis visualized. Bladder: Appears normal for degree of bladder distention. Other: None. IMPRESSION: No acute findings.  No hydronephrosis. Electronically Signed   By: Rolm Baptise M.D.   On: 06/28/2020 20:20   DG Shoulder Left  Result Date: 06/28/2020 CLINICAL DATA:  Left shoulder pain after physical therapy today. EXAM: LEFT SHOULDER - 2+ VIEW COMPARISON:  None. FINDINGS: No acute fracture or dislocation. Mild acromioclavicular osteoarthritis. Soft tissues are unremarkable. IMPRESSION: 1. No acute osseous abnormality. Electronically Signed   By:  Titus Dubin M.D.   On: 06/28/2020 17:39   DG Hand Complete Left  Result Date: 06/28/2020 CLINICAL DATA:  Left hand and wrist pain and swelling after physical therapy. No injury. EXAM: LEFT HAND - COMPLETE 3+ VIEW; LEFT WRIST - COMPLETE 3+ VIEW COMPARISON:  Left hand x-rays dated May 04, 2020. FINDINGS: No acute fracture or dislocation. Suspected large erosions involving the second and third proximal phalanx heads, third metacarpal head, and capitate are unchanged. Unchanged moderate third PIP joint and mild third MCP joint space narrowing. Remaining joint spaces are preserved. Bone mineralization is normal. New diffuse soft tissue swelling of the wrist, dorsal hand, and index finger. Progressive soft tissue swelling of the long finger. IMPRESSION: 1. Diffuse soft tissue swelling. No acute osseous abnormality. 2. 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 Electronically Signed   By: Titus Dubin M.D.   On: 06/28/2020 17:48   VAS [place] UPPER EXTREMITY VENOUS DUPLEX  Result Date: 07/04/2020 UPPER VENOUS STUDY  Indications: Swelling Comparison Study: No prior studies. Performing Technologist: Darlin Coco  Examination Guidelines: A complete evaluation includes B-mode imaging, spectral Doppler, color Doppler, and power Doppler as needed of all accessible portions of each vessel. Bilateral testing is considered an integral part of a complete examination. Limited examinations for reoccurring indications may be performed as noted.  Right Findings: +----------+------------+---------+-----------+----------+-------+ RIGHT     CompressiblePhasicitySpontaneousPropertiesSummary +----------+------------+---------+-----------+----------+-------+ Subclavian               Yes       Yes                      +----------+------------+---------+-----------+----------+-------+  Left Findings:  +----------+------------+---------+-----------+----------+-----------------+ LEFT      CompressiblePhasicitySpontaneousProperties     Summary      +----------+------------+---------+-----------+----------+-----------------+ IJV           Full  Yes       Yes                                +----------+------------+---------+-----------+----------+-----------------+ Subclavian               Yes       Yes                                +----------+------------+---------+-----------+----------+-----------------+ Axillary      Full       Yes       Yes                                +----------+------------+---------+-----------+----------+-----------------+ Brachial      None       No        No               Age Indeterminate +----------+------------+---------+-----------+----------+-----------------+ Radial        Full                                                    +----------+------------+---------+-----------+----------+-----------------+ Ulnar         Full                                                    +----------+------------+---------+-----------+----------+-----------------+ Cephalic      Full                                                    +----------+------------+---------+-----------+----------+-----------------+ Basilic       Full                                                    +----------+------------+---------+-----------+----------+-----------------+  Summary:  Right: No evidence of thrombosis in the subclavian.  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  *See table(s) above for measurements and observations.  Diagnosing physician: Deitra Mayo MD Electronically signed by Deitra Mayo MD on 07/04/2020 at 7:59:24 AM.    Final     Assessment/Plan  1. Gouty bursitis, elbow  -Denies pain, discontinue colchicine and continue allopurinol  2. DM  (diabetes mellitus), type 2 with neurological complications ([facility]) Lab Results  Component Value Date   HGBA1C 7.1 (H) 06/28/2020   -   CBGs stable, not on any diabetic medications -   Metformin and Victoza were discontinued due to hypoglycemia  3. Acute deep vein thrombosis (DVT) of left upper extremity, unspecified vein ([facility]CC) -Continue Eliquis  4. Slow transit constipation -Stable, continue MiraLAX  5. Microcytic anemia Lab Results  Component Value Date   HGB 8.6 (A) 07/22/2020   -Continue ferrous sulfate      Family/  staff Communication: Discussed plan of care with resident and charge nurse.  Labs/tests ordered: None  Goals of care:   Short-term care   Durenda Age, DNP, MSN, FNP-BC [facility] [phone] (Monday-Friday 8:00 a.m. - 5:00 p.m.) [phone] (after hours)

## 2020-08-01 ENCOUNTER — Encounter: Payer: Self-pay | Admitting: Adult Health

## 2020-08-01 ENCOUNTER — Non-Acute Institutional Stay (SKILLED_NURSING_FACILITY): Payer: Medicare HMO | Admitting: Adult Health

## 2020-08-01 DIAGNOSIS — I10 Essential (primary) hypertension: Secondary | ICD-10-CM

## 2020-08-01 DIAGNOSIS — E1169 Type 2 diabetes mellitus with other specified complication: Secondary | ICD-10-CM

## 2020-08-01 DIAGNOSIS — I82622 Acute embolism and thrombosis of deep veins of left upper extremity: Secondary | ICD-10-CM

## 2020-08-01 DIAGNOSIS — E1149 Type 2 diabetes mellitus with other diabetic neurological complication: Secondary | ICD-10-CM

## 2020-08-01 DIAGNOSIS — K219 Gastro-esophageal reflux disease without esophagitis: Secondary | ICD-10-CM

## 2020-08-01 DIAGNOSIS — E785 Hyperlipidemia, unspecified: Secondary | ICD-10-CM

## 2020-08-01 DIAGNOSIS — D509 Iron deficiency anemia, unspecified: Secondary | ICD-10-CM | POA: Diagnosis not present

## 2020-08-01 DIAGNOSIS — K3184 Gastroparesis: Secondary | ICD-10-CM

## 2020-08-01 DIAGNOSIS — N179 Acute kidney failure, unspecified: Secondary | ICD-10-CM

## 2020-08-01 DIAGNOSIS — M109 Gout, unspecified: Secondary | ICD-10-CM

## 2020-08-01 DIAGNOSIS — F5101 Primary insomnia: Secondary | ICD-10-CM

## 2020-08-01 MED ORDER — PANTOPRAZOLE SODIUM 40 MG PO TBEC: 40.0000 mg | DELAYED_RELEASE_TABLET | Freq: Every day | ORAL | 0 refills | Status: DC

## 2020-08-01 MED ORDER — CYCLOBENZAPRINE HCL 10 MG PO TABS
10.0000 mg | ORAL_TABLET | Freq: Three times a day (TID) | ORAL | 0 refills | Status: DC | PRN
Start: 2020-08-01 — End: 2020-09-11

## 2020-08-01 MED ORDER — MAGNESIUM OXIDE -MG SUPPLEMENT 200 MG PO TABS: 400.0000 mg | ORAL_TABLET | Freq: Every day | ORAL | 0 refills | Status: AC

## 2020-08-01 MED ORDER — FERROUS SULFATE 325 (65 FE) MG PO TABS: 325.0000 mg | ORAL_TABLET | Freq: Every day | ORAL | 0 refills | Status: AC

## 2020-08-01 MED ORDER — METOCLOPRAMIDE HCL 5 MG/5ML PO SOLN: 5.0000 mg | Freq: Three times a day (TID) | ORAL | 0 refills | Status: DC

## 2020-08-01 MED ORDER — APIXABAN 5 MG PO TABS: 5.0000 mg | ORAL_TABLET | Freq: Two times a day (BID) | ORAL | 0 refills | Status: AC

## 2020-08-01 MED ORDER — METOPROLOL SUCCINATE ER 50 MG PO TB24: 50.0000 mg | ORAL_TABLET | Freq: Every day | ORAL | 0 refills | Status: DC

## 2020-08-01 MED ORDER — DICLOFENAC SODIUM 1 % EX GEL
2.0000 g | Freq: Four times a day (QID) | CUTANEOUS | 0 refills | Status: DC
Start: 2020-08-01 — End: 2020-09-11

## 2020-08-01 MED ORDER — ATORVASTATIN CALCIUM 40 MG PO TABS: 40.0000 mg | ORAL_TABLET | Freq: Every day | ORAL | 0 refills | Status: AC

## 2020-08-01 MED ORDER — ALLOPURINOL 300 MG PO TABS: 300.0000 mg | ORAL_TABLET | Freq: Every day | ORAL | 0 refills | Status: AC

## 2020-08-01 MED ORDER — TRAZODONE HCL 50 MG PO TABS: 50.0000 mg | ORAL_TABLET | Freq: Every day | ORAL | 0 refills | Status: DC

## 2020-08-01 NOTE — Progress Notes (Signed)
Location:  [facility] Room Number: 115-A Place of Service:  SNF (31) Provider:  Durenda Age, DNP, FNP-BC  Patient Care Team: Charlott Rakes, MD as PCP - General (Family Medicine) Gala Romney Cristopher Estimable, MD as Consulting Physician (Gastroenterology)  Extended Emergency Contact Information Primary Emergency Contact: Bonner,Shirley Address: [address]          [place] [address] [place] Phone: [phone] Relation: Mother Secondary Emergency Contact: [name] Address: [place], [place] [address] [place] Phone: [phone] Relation: Significant other  Code Status:  FULL CODE  Goals of care: Advanced Directive information Advanced Directives 07/14/2020  Does Patient Have a Medical Advance Directive? Yes  Type of Advance Directive (No Data)  Does patient want to make changes to medical advance directive? No - Patient declined  Would patient like information on creating a medical advance directive? -     Chief Complaint  Patient presents with  . Discharge Note    Patient is seen for discharge from SNF    HPI:  Pt is a 59 y.o. male seen for discharge to home on 08/03/20 with Home health PT and OT.  He was admitted to [facility] on 07/10/20 post hospitalization 06/28/20 to 07/11/20 due to AKI, hyperkalemia and rhabdomyolysis. Aggressive hydration was done and his creatinine improved from 6.38 to 2.69. Orthopedics was consulted for his left elbow swelling and elbow joint effusion. He was started on steroids and Colchicine for the gouty flare up of the multiple joints. Guaiac was positive for stools. GI was consulted and recommended outpatient colonoscopy and EGD. Due to persistent left upper extremity pain, venous duplex of the LUE was done and was positive for DVT. He was started on IV Heparin but was held due to right-sided nostril bleeding. ENT was  consulted and was found to have a clot in the right nostril which was suctioned out. Nostril bleeding stopped and was restarted on Heparin, then transitioned to Eliquis. He has a PMH of IDDM, essential hypertension, dyslipidemia and gout.  While at [facility], having short-term  Rehabilitation, his blood sugars were low so Metformin, Victoza and Lantus were discontinued.                     Patient was admitted to this facility for short-term rehabilitation after the patient's recent hospitalization.  Patient has completed SNF rehabilitation and therapy has cleared the patient for discharge.               Past Medical History:  Diagnosis Date  . Gouty bursitis, elbow  10/18/2015  . Hyperlipidemia   . Hypertension   . Knee pain, chronic   . Type II diabetes mellitus ([facility])    Past Surgical History:  Procedure Laterality Date  . COLONOSCOPY    . KNEE ARTHROSCOPY Bilateral 12/29/2015   Procedure: ARTHROSCOPY KNEE BILATERAL WITH  PARTIAL MEDIAL AND LATERAL MENISCECTOMY AND FOUR COMPARTMENT SYNOVECTOMY , CHONDRALPLASTIES, PATELLA-FEMORAL CHONDRALPLASTIES BILATERALLY;  Surgeon: Dorna Leitz, MD;  Location: [facility];  Service: Orthopedics;  Laterality: Bilateral;    Allergies  Allergen Reactions  . Hctz [Hydrochlorothiazide]     Hx of gout; uric acid 14.1 on 03/13/20 Acute gout L elbow 8/11-8/24/21    Outpatient Encounter Medications as of 08/01/2020  Medication Sig  . allopurinol (ZYLOPRIM) 300 MG tablet Take 1 tablet (300 mg total) by mouth daily.  Marland Kitchen  apixaban (ELIQUIS) 5 MG TABS tablet Take 1 tablet (5 mg total) by mouth 2 (two) times daily.  Marland Kitchen atorvastatin (LIPITOR) 40 MG tablet Take 1 tablet (40 mg total) by mouth daily at 6 PM.  . cyclobenzaprine (FLEXERIL) 10 MG tablet Take 1 tablet (10 mg total) by mouth 3 (three) times daily as needed. For Muscle Pain  . diclofenac Sodium (VOLTAREN) 1 % GEL Apply 2 g topically 4 (four) times daily.  . ferrous sulfate 325 (65 FE)  MG tablet Take 1 tablet (325 mg total) by mouth daily with breakfast. For Iron Deficiency Anemia  . Magnesium Oxide 200 MG TABS Take 2 tablets (400 mg total) by mouth daily. Take 2 tabs to = 400 mg  . metoCLOPramide (REGLAN) 5 MG/5ML solution Take 5 mLs (5 mg total) by mouth 3 (three) times daily before meals.  . metoprolol succinate (TOPROL-XL) 50 MG 24 hr tablet Take 1 tablet (50 mg total) by mouth daily. Take with or immediately following a meal.  . NON FORMULARY Diet- Renal / CCD Regular Diet  . pantoprazole (PROTONIX) 40 MG tablet Take 1 tablet (40 mg total) by mouth at bedtime.  . polyethylene glycol (MIRALAX / GLYCOLAX) 17 g packet Take 17 g by mouth 2 (two) times daily.  . traZODone (DESYREL) 50 MG tablet Take 1 tablet (50 mg total) by mouth at bedtime.  . [DISCONTINUED] allopurinol (ZYLOPRIM) 300 MG tablet Take 1 tablet (300 mg total) by mouth daily.  . [DISCONTINUED] apixaban (ELIQUIS) 5 MG TABS tablet Take 1 tablet (5 mg total) by mouth 2 (two) times daily.  . [DISCONTINUED] aspirin 81 MG EC tablet Take 1 tablet (81 mg total) by mouth daily.  . [DISCONTINUED] atorvastatin (LIPITOR) 40 MG tablet Take 1 tablet (40 mg total) by mouth daily at 6 PM.  . [DISCONTINUED] cyclobenzaprine (FLEXERIL) 10 MG tablet Take 10 mg by mouth 3 (three) times daily as needed. For Muscle Pain  . [DISCONTINUED] diclofenac Sodium (VOLTAREN) 1 % GEL Apply 2 g topically 4 (four) times daily.  . [DISCONTINUED] ferrous sulfate 325 (65 FE) MG tablet Take 325 mg by mouth daily with breakfast. For Iron Deficiency Anemia  . [DISCONTINUED] Magnesium Oxide 200 MG TABS Take 400 mg by mouth daily. Take 2 tabs to = 400 mg  . [DISCONTINUED] metoCLOPramide (REGLAN) 5 MG/5ML solution Take 5 mg by mouth 3 (three) times daily before meals.  . [DISCONTINUED] metoprolol succinate (TOPROL-XL) 50 MG 24 hr tablet Take 1 tablet (50 mg total) by mouth daily. Take with or immediately following a meal.  . [DISCONTINUED] pantoprazole  (PROTONIX) 40 MG tablet Take 1 tablet (40 mg total) by mouth at bedtime.  . [DISCONTINUED] traZODone (DESYREL) 50 MG tablet Take 1 tablet (50 mg total) by mouth at bedtime.  . Accu-Chek FastClix Lancets MISC 1 each by Does not apply route 3 (three) times daily. as directed (Patient not taking: Reported on 07/18/2020)  . Alcohol Swabs (B-D SINGLE USE SWABS REGULAR) PADS Please use as instructed before checking blood sugar and before injecting insulin. (Patient not taking: Reported on 07/11/2020)  . Blood Glucose Calibration (ACCU-CHEK AVIVA) SOLN Use to calibrate your glucometer when needed. (Patient not taking: Reported on 07/11/2020)  . Blood Glucose Monitoring Suppl (ACCU-CHEK AVIVA PLUS) w/Device KIT USE AS INSTRUCTED THREE TIMES DAILY. E11.69 (Patient not taking: Reported on 07/11/2020)  . DROPLET PEN NEEDLES 32G X 4 MM MISC USE AS DIRECTED DAILY TO ADMINISTER VICTOZA (Patient not taking: Reported on 07/11/2020)  . glucose blood (  ACCU-CHEK AVIVA PLUS) test strip USE AS INSTRUCTED THREE TIMES DAILY. E11.69 (Patient not taking: Reported on 07/11/2020)  . Insulin Pen Needle (PEN NEEDLES) 30G X 8 MM MISC Use as directed (Patient not taking: Reported on 07/11/2020)  . [DISCONTINUED] colchicine 0.6 MG tablet Take 1 tablet (0.6 mg total) by mouth 2 (two) times daily.  . [DISCONTINUED] insulin glargine (LANTUS) 100 unit/mL SOPN Inject 0.15 mLs (15 Units total) into the skin 2 (two) times daily.  . [DISCONTINUED] liraglutide (VICTOZA) 18 MG/3ML SOPN Inject 0.2 mLs (1.2 mg total) into the skin every morning.  . [DISCONTINUED] metFORMIN (GLUCOPHAGE) 500 MG tablet Take 2 tablets (1,000 mg total) by mouth 2 (two) times daily with a meal.   No facility-administered encounter medications on file as of 08/01/2020.    Review of Systems  GENERAL: No change in appetite, no fatigue, no weight changes, no fever, chills or weakness MOUTH and THROAT: Denies oral discomfort, gingival pain or bleeding RESPIRATORY: no cough,  SOB, DOE, wheezing, hemoptysis CARDIAC: No chest pain or palpitations GI: No abdominal pain, diarrhea, constipation, heart burn, nausea or vomiting GU: Denies dysuria, frequency, hematuria, incontinence, or discharge NEUROLOGICAL: Denies dizziness, syncope, numbness, or headache PSYCHIATRIC: Denies feelings of depression or anxiety. No report of hallucinations, insomnia, paranoia, or agitation   Immunization History  Administered Date(s) Administered  . Influenza,inj,Quad PF,6+ Mos 10/19/2015, 08/11/2018  . Influenza-Unspecified 08/07/2017  . PFIZER SARS-COV-2 Vaccination 02/19/2020, 03/18/2020  . Pneumococcal Polysaccharide-23 10/10/2016  . Tdap 12/24/2018   Pertinent  Health Maintenance Due  Topic Date Due  . OPHTHALMOLOGY EXAM  Never done  . COLONOSCOPY  Never done  . URINE MICROALBUMIN  03/29/2020  . INFLUENZA VACCINE  06/18/2020  . FOOT EXAM  12/16/2020  . HEMOGLOBIN A1C  12/29/2020   Fall Risk  10/19/2019 06/29/2019 06/23/2018 10/13/2013  Falls in the past year? 0 0 No No     Vitals:   08/01/20 1223  BP: 130/70  Pulse: 64  Resp: 19  Temp: 98.1 F (36.7 C)  TempSrc: Oral  Weight: 222 lb 9.6 oz (101 kg)  Height: '5\' 5"'  (1.651 m)   Body mass index is 37.04 kg/m.  Physical Exam  GENERAL APPEARANCE: Well nourished. In no acute distress. Normal body habitus SKIN:  Skin is warm and dry.  MOUTH and THROAT: Lips are without lesions. Oral mucosa is moist and without lesions. Tongue is normal in shape, size, and color and without lesions RESPIRATORY: Breathing is even & unlabored, BS CTAB CARDIAC: RRR, no murmur,no extra heart sounds, BLE 2+ edema, LUE 1+edema GI: Abdomen soft, normal BS, no masses EXTREMITIES:  Able to move X 4 extremities NEUROLOGICAL: There is no tremor. Speech is clear. Alert and oriented X 3. PSYCHIATRIC:  Affect and behavior are appropriate  Labs reviewed: Recent Labs    07/08/20 0137 07/08/20 0137 07/09/20 0144 07/09/20 0144 07/10/20 0712  07/11/20 0327 07/22/20 0000 07/23/20 0000  NA 133*   < > 135   < >  --  133* 133* 131*  K 4.8   < > 5.0   < >  --  4.9 4.4 4.4  CL 102   < > 105   < >  --  102 105 102  CO2 21*   < > 21*   < >  --  20* 19 19  GLUCOSE 112*  --  98  --   --  91  --   --   BUN 46*   < > 38*   < >  --  19 42* 36*  CREATININE 1.38*   < > 1.19   < >  --  1.07 2.6* 1.9*  CALCIUM 8.9   < > 8.7*   < >  --  8.9 8.8 8.8  MG  --   --  1.9  --  1.6* 2.0  --   --   PHOS  --   --  3.1  --  2.9 2.8  --   --    < > = values in this interval not displayed.   Recent Labs    07/03/20 0122 07/09/20 0144 07/11/20 0327  AST 45* 24 20  ALT 45* 27 25  ALKPHOS 60 53 60  BILITOT 1.0 1.0 1.2  PROT 6.9 6.1* 6.1*  ALBUMIN 2.5* 2.5* 2.6*   Recent Labs    07/09/20 0144 07/09/20 0144 07/10/20 0712 07/10/20 0712 07/11/20 0327 07/14/20 0000 07/22/20 0000  WBC 13.4*   < > 13.7*   < > 12.4* 8.4 4.3  NEUTROABS 10.4*   < > 11.1*  --  9.7*  --  2  HGB 7.6*   < > 7.7*   < > 7.7* 8.8* 8.6*  HCT 24.3*   < > 24.8*   < > 24.7* 27* 27*  MCV 76.4*  --  78.2*  --  76.7*  --   --   PLT 298   < > 272   < > 262 241 194   < > = values in this interval not displayed.   Lab Results  Component Value Date   TSH 1.71 07/14/2020   Lab Results  Component Value Date   HGBA1C 7.1 (H) 06/28/2020   Lab Results  Component Value Date   CHOL 114 03/13/2020   HDL 29 (L) 03/13/2020   LDLCALC 61 03/13/2020   TRIG 135 03/13/2020   CHOLHDL 3.9 03/13/2020    Significant Diagnostic Results in last 30 days:  VAS [place] UPPER EXTREMITY VENOUS DUPLEX  Result Date: 07/04/2020 UPPER VENOUS STUDY  Indications: Swelling Comparison Study: No prior studies. Performing Technologist: Darlin Coco  Examination Guidelines: A complete evaluation includes B-mode imaging, spectral Doppler, color Doppler, and power Doppler as needed of all accessible portions of each vessel. Bilateral testing is considered an integral part of a complete examination. Limited  examinations for reoccurring indications may be performed as noted.  Right Findings: +----------+------------+---------+-----------+----------+-------+ RIGHT     CompressiblePhasicitySpontaneousPropertiesSummary +----------+------------+---------+-----------+----------+-------+ Subclavian               Yes       Yes                      +----------+------------+---------+-----------+----------+-------+  Left Findings: +----------+------------+---------+-----------+----------+-----------------+ LEFT      CompressiblePhasicitySpontaneousProperties     Summary      +----------+------------+---------+-----------+----------+-----------------+ IJV           Full       Yes       Yes                                +----------+------------+---------+-----------+----------+-----------------+ Subclavian               Yes       Yes                                +----------+------------+---------+-----------+----------+-----------------+ Axillary      Full  Yes       Yes                                +----------+------------+---------+-----------+----------+-----------------+ Brachial      None       No        No               Age Indeterminate +----------+------------+---------+-----------+----------+-----------------+ Radial        Full                                                    +----------+------------+---------+-----------+----------+-----------------+ Ulnar         Full                                                    +----------+------------+---------+-----------+----------+-----------------+ Cephalic      Full                                                    +----------+------------+---------+-----------+----------+-----------------+ Basilic       Full                                                    +----------+------------+---------+-----------+----------+-----------------+  Summary:  Right: No evidence of thrombosis in the  subclavian.  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  *See table(s) above for measurements and observations.  Diagnosing physician: Deitra Mayo MD Electronically signed by Deitra Mayo MD on 07/04/2020 at 7:59:24 AM.    Final     Assessment/Plan  1. AKI (acute kidney injury) [facility]) Lab Results  Component Value Date   NA 131 (A) 07/23/2020   K 4.4 07/23/2020   CO2 19 07/23/2020   GLUCOSE 91 07/11/2020   BUN 36 (A) 07/23/2020   CREATININE 1.9 (A) 07/23/2020   CALCIUM 8.8 07/23/2020   GFRNONAA 10 07/23/2020   GFRAA 44 07/23/2020   -  Colchicine was discontinued  2. Gouty bursitis, elbow  - allopurinol (ZYLOPRIM) 300 MG tablet; Take 1 tablet (300 mg total) by mouth daily.  Dispense: 30 tablet; Refill: 0 - left elbow pain improved, Colchicine was discontinued  3. DM (diabetes mellitus), type 2 with neurological complications (HCC) Lab Results  Component Value Date   HGBA1C 7.1 (H) 06/28/2020   - Lantus, Victoza and Metformin were discontinued due to hypoglycemias  4. Acute deep vein thrombosis (DVT) of left upper extremity, unspecified vein (HCC) - apixaban (ELIQUIS) 5 MG TABS tablet; Take 1 tablet (5 mg total) by mouth 2 (two) times daily.  Dispense: 60 tablet; Refill: 0  5. Microcytic anemia Lab Results  Component Value Date   HGB 8.6 (A) 07/22/2020   - ferrous sulfate 325 (65 FE) MG tablet; Take 1 tablet (325 mg total) by mouth daily with breakfast. For Iron Deficiency Anemia  Dispense: 30 tablet; Refill: 0  6. Primary insomnia - traZODone (DESYREL) 50 MG tablet; Take 1 tablet (50 mg total) by mouth at bedtime.  Dispense: 30 tablet; Refill: 0  7. Hyperlipidemia associated with type 2 diabetes mellitus [facility]) Lab Results  Component Value Date   CHOL 114 03/13/2020   HDL 29 (L) 03/13/2020   LDLCALC 61 03/13/2020   TRIG 135 03/13/2020   CHOLHDL 3.9 03/13/2020   - atorvastatin (LIPITOR) 40 MG  tablet; Take 1 tablet (40 mg total) by mouth daily at 6 PM.  Dispense: 30 tablet; Refill: 0  8. Hypomagnesemia - Magnesium Oxide 200 MG TABS; Take 2 tablets (400 mg total) by mouth daily. Take 2 tabs to = 400 mg  Dispense: 30 tablet; Refill: 0  9. Gastroparesis - metoCLOPramide (REGLAN) 5 MG/5ML solution; Take 5 mLs (5 mg total) by mouth 3 (three) times daily before meals.  Dispense: 473 mL; Refill: 0  10. Essential hypertension - metoprolol succinate (TOPROL-XL) 50 MG 24 hr tablet; Take 1 tablet (50 mg total) by mouth daily. Take with or immediately following a meal.  Dispense: 30 tablet; Refill: 0  11. Gastroesophageal reflux disease without esophagitis - pantoprazole (PROTONIX) 40 MG tablet; Take 1 tablet (40 mg total) by mouth at bedtime.  Dispense: 30 tablet; Refill: 0      I have filled out patient's discharge paperwork and e-prescribed medications.  Patient will have home health PT and OT.  DME provided:  3-in-1 and shower bench  Total discharge time: Greater than 30 minutes Greater than 50% was spent in counseling and coordination of care.   Discharge time involved coordination of the discharge process with social worker, nursing staff and therapy department. Medical justification for home health services/DME verified.     Durenda Age, DNP, MSN, FNP-BC [facility] [phone] (Monday-Friday 8:00 a.m. - 5:00 p.m.) [phone] (after hours)

## 2020-08-07 DIAGNOSIS — N184 Chronic kidney disease, stage 4 (severe): Secondary | ICD-10-CM | POA: Diagnosis not present

## 2020-08-07 DIAGNOSIS — D631 Anemia in chronic kidney disease: Secondary | ICD-10-CM | POA: Diagnosis not present

## 2020-08-07 DIAGNOSIS — M10022 Idiopathic gout, left elbow: Secondary | ICD-10-CM | POA: Diagnosis not present

## 2020-08-07 DIAGNOSIS — M170 Bilateral primary osteoarthritis of knee: Secondary | ICD-10-CM | POA: Diagnosis not present

## 2020-08-07 DIAGNOSIS — E1122 Type 2 diabetes mellitus with diabetic chronic kidney disease: Secondary | ICD-10-CM | POA: Diagnosis not present

## 2020-08-07 DIAGNOSIS — G8929 Other chronic pain: Secondary | ICD-10-CM | POA: Diagnosis not present

## 2020-08-07 DIAGNOSIS — E1143 Type 2 diabetes mellitus with diabetic autonomic (poly)neuropathy: Secondary | ICD-10-CM | POA: Diagnosis not present

## 2020-08-07 DIAGNOSIS — I129 Hypertensive chronic kidney disease with stage 1 through stage 4 chronic kidney disease, or unspecified chronic kidney disease: Secondary | ICD-10-CM | POA: Diagnosis not present

## 2020-08-07 DIAGNOSIS — K3184 Gastroparesis: Secondary | ICD-10-CM | POA: Diagnosis not present

## 2020-08-08 ENCOUNTER — Emergency Department (HOSPITAL_COMMUNITY)
Admission: EM | Admit: 2020-08-08 | Discharge: 2020-08-08 | Disposition: A | Discharge status: Home / Self Care | Payer: Medicare HMO | Attending: Emergency Medicine | Admitting: Emergency Medicine

## 2020-08-08 ENCOUNTER — Emergency Department (HOSPITAL_COMMUNITY): Payer: Medicare HMO

## 2020-08-08 ENCOUNTER — Encounter (HOSPITAL_COMMUNITY): Payer: Self-pay

## 2020-08-08 ENCOUNTER — Emergency Department (HOSPITAL_BASED_OUTPATIENT_CLINIC_OR_DEPARTMENT_OTHER): Payer: Medicare HMO

## 2020-08-08 DIAGNOSIS — I517 Cardiomegaly: Secondary | ICD-10-CM | POA: Diagnosis not present

## 2020-08-08 DIAGNOSIS — M7989 Other specified soft tissue disorders: Secondary | ICD-10-CM | POA: Diagnosis not present

## 2020-08-08 DIAGNOSIS — R000 Tachycardia, unspecified: Secondary | ICD-10-CM | POA: Insufficient documentation

## 2020-08-08 DIAGNOSIS — I82622 Acute embolism and thrombosis of deep veins of left upper extremity: Secondary | ICD-10-CM | POA: Diagnosis not present

## 2020-08-08 DIAGNOSIS — G8929 Other chronic pain: Secondary | ICD-10-CM | POA: Insufficient documentation

## 2020-08-08 DIAGNOSIS — R600 Localized edema: Secondary | ICD-10-CM | POA: Insufficient documentation

## 2020-08-08 DIAGNOSIS — M79602 Pain in left arm: Secondary | ICD-10-CM | POA: Diagnosis present

## 2020-08-08 DIAGNOSIS — Z79899 Other long term (current) drug therapy: Secondary | ICD-10-CM | POA: Insufficient documentation

## 2020-08-08 DIAGNOSIS — Z96652 Presence of left artificial knee joint: Secondary | ICD-10-CM | POA: Insufficient documentation

## 2020-08-08 DIAGNOSIS — R5381 Other malaise: Secondary | ICD-10-CM | POA: Diagnosis not present

## 2020-08-08 DIAGNOSIS — J811 Chronic pulmonary edema: Secondary | ICD-10-CM | POA: Diagnosis not present

## 2020-08-08 DIAGNOSIS — R609 Edema, unspecified: Secondary | ICD-10-CM | POA: Diagnosis not present

## 2020-08-08 DIAGNOSIS — Z20822 Contact with and (suspected) exposure to covid-19: Secondary | ICD-10-CM | POA: Diagnosis not present

## 2020-08-08 DIAGNOSIS — Z96651 Presence of right artificial knee joint: Secondary | ICD-10-CM | POA: Diagnosis not present

## 2020-08-08 DIAGNOSIS — M79609 Pain in unspecified limb: Secondary | ICD-10-CM | POA: Diagnosis not present

## 2020-08-08 DIAGNOSIS — M25512 Pain in left shoulder: Secondary | ICD-10-CM | POA: Diagnosis not present

## 2020-08-08 DIAGNOSIS — E1140 Type 2 diabetes mellitus with diabetic neuropathy, unspecified: Secondary | ICD-10-CM | POA: Insufficient documentation

## 2020-08-08 DIAGNOSIS — Z7901 Long term (current) use of anticoagulants: Secondary | ICD-10-CM | POA: Insufficient documentation

## 2020-08-08 DIAGNOSIS — I10 Essential (primary) hypertension: Secondary | ICD-10-CM | POA: Insufficient documentation

## 2020-08-08 DIAGNOSIS — R52 Pain, unspecified: Secondary | ICD-10-CM | POA: Diagnosis not present

## 2020-08-08 LAB — CBC WITH DIFFERENTIAL/PLATELET
Abs Immature Granulocytes: 0.09 10*3/uL — ABNORMAL HIGH (ref 0.00–0.07)
Basophils Absolute: 0 10*3/uL (ref 0.0–0.1)
Basophils Relative: 0 %
Eosinophils Absolute: 0 10*3/uL (ref 0.0–0.5)
Eosinophils Relative: 0 %
HCT: 26.6 % — ABNORMAL LOW (ref 39.0–52.0)
Hemoglobin: 8.3 g/dL — ABNORMAL LOW (ref 13.0–17.0)
Immature Granulocytes: 1 %
Lymphocytes Relative: 7 %
Lymphs Abs: 0.9 10*3/uL (ref 0.7–4.0)
MCH: 24.9 pg — ABNORMAL LOW (ref 26.0–34.0)
MCHC: 31.2 g/dL (ref 30.0–36.0)
MCV: 79.6 fL — ABNORMAL LOW (ref 80.0–100.0)
Monocytes Absolute: 1.1 10*3/uL — ABNORMAL HIGH (ref 0.1–1.0)
Monocytes Relative: 8 %
Neutro Abs: 11.5 10*3/uL — ABNORMAL HIGH (ref 1.7–7.7)
Neutrophils Relative %: 84 %
Platelets: 201 10*3/uL (ref 150–400)
RBC: 3.34 MIL/uL — ABNORMAL LOW (ref 4.22–5.81)
RDW: 20.2 % — ABNORMAL HIGH (ref 11.5–15.5)
WBC: 13.6 10*3/uL — ABNORMAL HIGH (ref 4.0–10.5)
nRBC: 0 % (ref 0.0–0.2)

## 2020-08-08 LAB — TROPONIN I (HIGH SENSITIVITY): Troponin I (High Sensitivity): 9 ng/L (ref <18)

## 2020-08-08 LAB — BASIC METABOLIC PANEL
Anion gap: 13 (ref 5–15)
BUN: 19 mg/dL (ref 6–20)
CO2: 22 mmol/L (ref 22–32)
Calcium: 8.3 mg/dL — ABNORMAL LOW (ref 8.9–10.3)
Chloride: 100 mmol/L (ref 98–111)
Creatinine, Ser: 1.21 mg/dL (ref 0.61–1.24)
GFR calc Af Amer: >60 mL/min (ref >60)
GFR calc non Af Amer: >60 mL/min (ref >60)
Glucose, Bld: 193 mg/dL — ABNORMAL HIGH (ref 70–99)
Potassium: 4.1 mmol/L (ref 3.5–5.1)
Sodium: 135 mmol/L (ref 135–145)

## 2020-08-08 LAB — SARS CORONAVIRUS 2 BY RT PCR (HOSPITAL ORDER, PERFORMED IN ~~LOC~~ HOSPITAL LAB): SARS Coronavirus 2: NEGATIVE

## 2020-08-08 LAB — BRAIN NATRIURETIC PEPTIDE: B Natriuretic Peptide: 45.3 pg/mL (ref 0.0–100.0)

## 2020-08-08 MED ORDER — APIXABAN (ELIQUIS) EDUCATION KIT FOR DVT/PE PATIENTS
PACK | Freq: Once | Status: AC
  Filled 2020-08-08 (×2): qty 1

## 2020-08-08 MED ORDER — MIDAZOLAM HCL 2 MG/2ML IJ SOLN
2.0000 mg | Freq: Once | INTRAMUSCULAR | Status: AC
  Administered 2020-08-08: 2 mg via INTRAVENOUS
  Filled 2020-08-08: qty 2

## 2020-08-08 MED ORDER — APIXABAN 5 MG PO TABS
10.0000 mg | ORAL_TABLET | Freq: Two times a day (BID) | ORAL | Status: DC
  Administered 2020-08-08: 10 mg via ORAL
  Filled 2020-08-08: qty 2

## 2020-08-08 MED ORDER — MORPHINE SULFATE (PF) 4 MG/ML IV SOLN
4.0000 mg | Freq: Once | INTRAVENOUS | Status: AC
  Administered 2020-08-08: 4 mg via INTRAVENOUS
  Filled 2020-08-08: qty 1

## 2020-08-08 MED ORDER — IOHEXOL 350 MG/ML SOLN
80.0000 mL | Freq: Once | INTRAVENOUS | Status: AC | PRN
  Administered 2020-08-08: 68 mL via INTRAVENOUS

## 2020-08-08 MED ORDER — APIXABAN (ELIQUIS) VTE STARTER PACK (10MG AND 5MG): ORAL_TABLET | ORAL | 0 refills | Status: DC

## 2020-08-08 NOTE — ED Notes (Signed)
Vascular at bedside

## 2020-08-08 NOTE — ED Triage Notes (Signed)
Pt presents with c/o left arm swelling and bilateral leg swelling that started yesterday. EMS reports some warmth to that left arm. Pt denies any injury to his arm or either leg. Pt reports he just came home from [facility].

## 2020-08-08 NOTE — ED Provider Notes (Signed)
[facility] DEPT Provider Note   CSN: [id number] Arrival date & time: 08/08/20  0845     History Chief Complaint  Patient presents with  . Arm Swelling  . Leg Swelling    [name] is a 59 y.o. 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 Patient reports he was discharged from rehab facility home one week ago.  He reports he has had new and worsening pain in his left shoulder and his left arm for the past 2 days.  He reports swelling in his entire left arm.  He tells me that he has never had these symptoms before. The pain is 10/10.  It is worse with any movement of his left arm.  Nothing makes it better.   He called 911 to come in to the ER because "I couldn't get out of my chair this morning."  Hospital records from 08/01/2020 note from SNF NP at [facility]:  "He was admitted to [facility] on 07/10/20 post hospitalization 06/28/20 to 07/11/20 due to AKI, hyperkalemia and rhabdomyolysis. Aggressive hydration was done and his creatinine improved from 6.38 to 2.69. Orthopedics was consulted for his left elbow swelling and elbow joint effusion. He was started on steroids and Colchicine for the gouty flare up of the multiple joints. Guaiac was positive for stools. GI was consulted and recommended outpatient colonoscopy and EGD. Due to persistent left upper extremity pain, venous duplex of the LUE was done and was positive for DVT. He was started on IV Heparin but was held due to right-sided nostril bleeding. ENT was consulted and was found to have a clot in the right nostril which was suctioned out. Nostril bleeding stopped and was restarted on Heparin, then transitioned to Eliquis. He has a PMH of IDDM, essential hypertension, dyslipidemia and gout."  The patient presents with a bag of medications from  home, which he states are all of his medications. This does not include Eliquis.  He does not think he was discharged on blood thinners.  He denies CP or SOB.  He has a chronic tremor in his arms.  HPI     Past Medical History:  Diagnosis Date  . Gouty bursitis, elbow  10/18/2015  . Hyperlipidemia   . Hypertension   . Knee pain, chronic   . Type II diabetes mellitus [facility])     Patient Active Problem List   Diagnosis Date Noted  . Nausea and vomiting 07/18/2020  . Tachycardia 07/11/2020  . Neurocognitive deficits 07/11/2020  . Non-traumatic rhabdomyolysis   . Arthralgia   . Left elbow pain   . Left hand pain   . AKI (acute kidney injury) ([place]) 06/28/2020  . Pain due to onychomycosis of toenails of both feet 12/17/2019  . Class 3 severe obesity due to excess calories with serious comorbidity and body mass index (BMI) of 45.0 to 49.9 in adult ([place]) 09/23/2018  . Diabetic neuropathy ([place]) 09/08/2017  . Insomnia 12/26/2016  . Scrotal abscess 10/09/2016  . Acute meniscal tear of right knee 12/29/2015  . Acute meniscal tear of left knee 12/29/2015  . Other specified crystal arthropathies, left knee 12/29/2015  . Other specified crystal arthropathies, right knee 12/29/2015  . Arthritis of knee   . Inability to ambulate due to knee   . Microcytic anemia   . Bilateral knee pain  12/23/2015  . Hypertension 12/23/2015  . Chronic mid back pain 12/23/2015  . Gout 12/23/2015  . Osteoarthritis of both knees 12/23/2015  . Intractable pain 12/23/2015  . Normocytic anemia 12/23/2015  . Gouty bursitis, elbow  10/18/2015  . DM (diabetes mellitus), type 2 with neurological complications ([place]) 17/49/4496    Past Surgical History:  Procedure Laterality Date  . COLONOSCOPY    . KNEE ARTHROSCOPY Bilateral 12/29/2015   Procedure: ARTHROSCOPY KNEE BILATERAL WITH  PARTIAL MEDIAL AND LATERAL MENISCECTOMY AND FOUR COMPARTMENT SYNOVECTOMY , CHONDRALPLASTIES, PATELLA-FEMORAL CHONDRALPLASTIES  BILATERALLY;  Surgeon: Dorna Leitz, MD;  Location: [facility];  Service: Orthopedics;  Laterality: Bilateral;       Family History  Problem Relation Age of Onset  . Diabetes Mother   . Diabetes Father     Social History   Tobacco Use  . Smoking status: Never Smoker  . Smokeless tobacco: Never Used  Vaping Use  . Vaping Use: Never used  Substance Use Topics  . Alcohol use: No  . Drug use: No    Home Medications Prior to Admission medications   Medication Sig Start Date End Date Taking? Authorizing Provider  allopurinol (ZYLOPRIM) 300 MG tablet Take 1 tablet (300 mg total) by mouth daily. 08/01/20  Yes Medina-Vargas, Monina C, NP  apixaban (ELIQUIS) 5 MG TABS tablet Take 1 tablet (5 mg total) by mouth 2 (two) times daily. 08/01/20  Yes Medina-Vargas, Monina C, NP  atorvastatin (LIPITOR) 40 MG tablet Take 1 tablet (40 mg total) by mouth daily at 6 PM. 08/01/20  Yes Medina-Vargas, Monina C, NP  cyclobenzaprine (FLEXERIL) 10 MG tablet Take 1 tablet (10 mg total) by mouth 3 (three) times daily as needed. For Muscle Pain 08/01/20  Yes Medina-Vargas, Monina C, NP  diclofenac Sodium (VOLTAREN) 1 % GEL Apply 2 g topically 4 (four) times daily. 08/01/20  Yes Medina-Vargas, Monina C, NP  ferrous sulfate 325 (65 FE) MG tablet Take 1 tablet (325 mg total) by mouth daily with breakfast. For Iron Deficiency Anemia 08/01/20  Yes Medina-Vargas, Monina C, NP  Magnesium Oxide 200 MG TABS Take 2 tablets (400 mg total) by mouth daily. Take 2 tabs to = 400 mg 08/01/20  Yes Medina-Vargas, Monina C, NP  metoCLOPramide (REGLAN) 5 MG/5ML solution Take 5 mLs (5 mg total) by mouth 3 (three) times daily before meals. 08/01/20  Yes Medina-Vargas, Monina C, NP  metoprolol succinate (TOPROL-XL) 50 MG 24 hr tablet Take 1 tablet (50 mg total) by mouth daily. Take with or immediately following a meal. 08/01/20  Yes Medina-Vargas, Monina C, NP  pantoprazole (PROTONIX) 40 MG tablet Take 1 tablet (40 mg total) by mouth at bedtime.  08/01/20  Yes Medina-Vargas, Monina C, NP  traZODone (DESYREL) 50 MG tablet Take 1 tablet (50 mg total) by mouth at bedtime. 08/01/20  Yes Medina-Vargas, Monina C, NP  Accu-Chek FastClix Lancets MISC 1 each by Does not apply route 3 (three) times daily. as directed Patient not taking: Reported on 07/18/2020 02/09/20   Charlott Rakes, MD  Alcohol Swabs (B-D SINGLE USE SWABS REGULAR) PADS Please use as instructed before checking blood sugar and before injecting insulin. Patient not taking: Reported on 07/11/2020 02/09/20   Charlott Rakes, MD  APIXABAN Arne Cleveland) VTE STARTER PACK (10MG AND 5MG) Take as directed on package: start with two-50m tablets twice daily for 7 days. On day 8, switch to one-520mtablet twice daily. 08/08/20   TrWyvonnia DuskyMD  Blood Glucose Calibration (ACCU-CHEK AVIVA) SOLN Use to  calibrate your glucometer when needed. Patient not taking: Reported on 07/11/2020 02/09/20   Charlott Rakes, MD  Blood Glucose Monitoring Suppl (ACCU-CHEK AVIVA PLUS) w/Device KIT USE AS INSTRUCTED THREE TIMES DAILY. E11.69 Patient not taking: Reported on 07/11/2020 02/09/20   Charlott Rakes, MD  DROPLET PEN NEEDLES 32G X 4 MM MISC USE AS DIRECTED DAILY TO ADMINISTER [place] Patient not taking: Reported on 07/11/2020 05/25/20   Charlott Rakes, MD  glucose blood (ACCU-CHEK AVIVA PLUS) test strip USE AS INSTRUCTED THREE TIMES DAILY. E11.69 Patient not taking: Reported on 07/11/2020 02/09/20   Charlott Rakes, MD  Insulin Pen Needle (PEN NEEDLES) 30G X 8 MM MISC Use as directed Patient not taking: Reported on 07/11/2020 07/11/20   Allie Bossier, MD  NON FORMULARY Diet- Renal / CCD Regular Diet 07/12/20   [provider]    Allergies    Hctz [hydrochlorothiazide]  Review of Systems   Review of Systems  Constitutional: Negative for chills and fever.  HENT: Negative for ear pain and sore throat.   Eyes: Negative for pain and visual disturbance.  Respiratory: Negative for cough and shortness of  breath.   Cardiovascular: Positive for leg swelling. Negative for chest pain and palpitations.  Gastrointestinal: Negative for abdominal pain and vomiting.  Genitourinary: Negative for dysuria and hematuria.  Musculoskeletal: Positive for arthralgias and myalgias.  Skin: Negative for color change and rash.  Neurological: Positive for tremors. Negative for syncope and headaches.  Psychiatric/Behavioral: Negative for agitation and confusion.  All other systems reviewed and are negative.   Physical Exam Updated Vital Signs BP 131/87   Pulse (!) 113   Temp 99.5 F (37.5 C) (Oral)   Resp (!) 27   Ht '5\' 5"'  (1.651 m)   Wt 107 kg   SpO2 97%   BMI 39.27 kg/m   Physical Exam Vitals and nursing note reviewed.  Constitutional:      Appearance: He is well-developed. He is obese.  HENT:     Head: Normocephalic and atraumatic.  Eyes:     Conjunctiva/sclera: Conjunctivae normal.     Pupils: Pupils are equal, round, and reactive to light.  Cardiovascular:     Rate and Rhythm: Regular rhythm. Tachycardia present.     Pulses: Normal pulses.  Pulmonary:     Effort: Pulmonary effort is normal. No respiratory distress.     Breath sounds: Normal breath sounds.  Abdominal:     Palpations: Abdomen is soft.     Tenderness: There is no abdominal tenderness.  Musculoskeletal:     Cervical back: Neck supple.     Comments: Diffuse symmetrical edema of the left upper extremity Significant tenderness to palpation and movement of the left elbow, wrist and left shoulder, without overlying warmth or erythema Symmetrical swelling and edema of the bilateral lower extremities without focal ttp  Skin:    General: Skin is warm and dry.  Neurological:     General: No focal deficit present.     Mental Status: He is alert.     Comments: Coarse diffuse tremors  Psychiatric:        Mood and Affect: Mood normal.        Behavior: Behavior normal.     ED Results / Procedures / Treatments   Labs (all  labs ordered are listed, but only abnormal results are displayed) Labs Reviewed  BASIC METABOLIC PANEL - Abnormal; Notable for the following components:      Result Value   Glucose, Bld 193 (*)  Calcium 8.3 (*)    All other components within normal limits  CBC WITH DIFFERENTIAL/PLATELET - Abnormal; Notable for the following components:   WBC 13.6 (*)    RBC 3.34 (*)    Hemoglobin 8.3 (*)    HCT 26.6 (*)    MCV 79.6 (*)    MCH 24.9 (*)    RDW 20.2 (*)    Neutro Abs 11.5 (*)    Monocytes Absolute 1.1 (*)    Abs Immature Granulocytes 0.09 (*)    All other components within normal limits  SARS CORONAVIRUS 2 BY RT PCR (HOSPITAL ORDER, [facility])  BRAIN NATRIURETIC PEPTIDE  TROPONIN I (HIGH SENSITIVITY)    EKG EKG Interpretation  Date/Time:  Tuesday August 08 2020 10:15:03 EDT Ventricular Rate:  111 PR Interval:    QRS Duration: 88 QT Interval:  311 QTC Calculation: 423 R Axis:   -12 Text Interpretation: Sinus node reentrant tachycardia No STEMI Confirmed by Octaviano Glow [phone]) on 08/08/2020 10:21:00 AM   Radiology DG Chest 2 View  Result Date: 08/08/2020 CLINICAL DATA:  Left arm and bilateral leg swelling. EXAM: CHEST - 2 VIEW COMPARISON:  06/28/2020. FINDINGS: Stable cardiomegaly. Mild pulmonary venous congestion. No focal pulmonary infiltrate or edema. No pleural effusion or pneumothorax. Thoracic spine scoliosis. IMPRESSION: Stable cardiomegaly. Mild pulmonary venous congestion. No focal pulmonary infiltrate or edema. Electronically Signed   By: Marcello Moores  Register   On: 08/08/2020 10:51   CT Angio Chest PE W and/or Wo Contrast  Result Date: 08/08/2020 CLINICAL DATA:  Tachycardia, lower extremity swelling EXAM: CT ANGIOGRAPHY CHEST WITH CONTRAST TECHNIQUE: Multidetector CT imaging of the chest was performed using the standard protocol during bolus administration of intravenous contrast. Multiplanar CT image reconstructions and MIPs were  obtained to evaluate the vascular anatomy. CONTRAST:  90m OMNIPAQUE IOHEXOL 350 MG/ML SOLN COMPARISON:  Same day chest x-ray FINDINGS: Cardiovascular: Examination is mildly degraded by respiratory motion artifact. Adequate opacification of the pulmonary arteries. No filling defect is evident to the lobar branch level. Thoracic aorta is normal in course and caliber. Heart size is upper limits of normal. No pericardial effusion. Mediastinum/Nodes: No enlarged mediastinal, hilar, or axillary lymph nodes. Thyroid gland, trachea, and esophagus demonstrate no significant findings. Lungs/Pleura: Lungs are clear. No pleural effusion or pneumothorax. Upper Abdomen: Probable subcentimeter right adrenal adenoma. No acute findings. Musculoskeletal: Degenerative changes within the thoracic spine. No acute osseous findings. Right-sided gynecomastia. Review of the MIP images confirms the above findings. IMPRESSION: 1. Slightly limited study. Negative for pulmonary embolism to the lobar branch level. 2. Lungs are clear. 3. Probable subcentimeter right adrenal adenoma. Electronically Signed   By: NDavina PokeD.O.   On: 08/08/2020 15:07   DG Shoulder Left  Result Date: 08/08/2020 CLINICAL DATA:  SWELLING, ARTHRALGIA, EVALUATE FOR FRACTURE. EXAM: LEFT SHOULDER - 2+ VIEW COMPARISON:  Shoulder 10/07/2020 FINDINGS: No evidence acute fracture or dislocation. Moderate acromioclavicular degenerative change with degenerative remodeling. Glenohumeral joint is maintained. IMPRESSION: 1. No evidence acute fracture or dislocation. 2. Moderate acromioclavicular degenerative change. Electronically Signed   By: FMargaretha SheffieldMD   On: 08/08/2020 10:52   UE Venous Duplex ([facility] and [facility] ONLY)  Result Date: 08/08/2020 UPPER VENOUS STUDY  Indications: Pain, Swelling, and Edema Risk Factors: DVT Previous posotive DVT Left Arm 07/04/20. Performing Technologist: VGriffin BasilRCT RDMS  Examination Guidelines: A complete evaluation includes  B-mode imaging, spectral Doppler, color Doppler, and power Doppler as needed of all accessible portions of each vessel.  Bilateral testing is considered an integral part of a complete examination. Limited examinations for reoccurring indications may be performed as noted.  Left Findings: +----------+------------+---------+-----------+----------+---------------------+ LEFT      CompressiblePhasicitySpontaneousProperties       Summary        +----------+------------+---------+-----------+----------+---------------------+ IJV           Full       Yes       Yes                                    +----------+------------+---------+-----------+----------+---------------------+ Subclavian    Full       Yes       Yes                                    +----------+------------+---------+-----------+----------+---------------------+ Axillary      Full       Yes       Yes                                    +----------+------------+---------+-----------+----------+---------------------+ Brachial      None       No        No                                     +----------+------------+---------+-----------+----------+---------------------+ Radial                                               Patient refused the                                                      rest of the study due                                                        to extreme pain    +----------+------------+---------+-----------+----------+---------------------+  Summary:  Left: Findings consistent with age indeterminate deep vein thrombosis involving the left brachial veins. Findings for deep vein thrombosis appear unchanged from previous study. Severe pain left arm , Patient refused part of Exam.  *See table(s) above for measurements and observations.  Diagnosing physician: Deitra Mayo MD Electronically signed by Deitra Mayo MD on 08/08/2020 at 4:48:22 PM.    Final      Procedures Procedures (including critical care time)  Medications Ordered in ED Medications  apixaban (ELIQUIS) tablet 10 mg (10 mg Oral Given 08/08/20 1640)  morphine 4 MG/ML injection 4 mg (4 mg Intravenous Given 08/08/20 1018)  iohexol (OMNIPAQUE) 350 MG/ML injection 80 mL (68 mLs Intravenous Contrast Given 08/08/20 1439)  midazolam (VERSED) injection 2 mg (2 mg Intravenous Given 08/08/20 1426)  apixaban (ELIQUIS) Education Kit for DVT/PE patients ( Does not apply Given 08/08/20 1640)  ED Course  I have reviewed the triage vital signs and the nursing notes.  Pertinent labs & imaging results that were available during my care of the patient were reviewed by me and considered in my medical decision making (see chart for details).  This patient complains of left arm pain and swelling.  This involves an extensive number of treatment options, and is a complaint that carries with it a high risk of complications and morbidity.  The differential diagnosis includes propogation of DVT vs occult fx vs gouty arthritis vs PNA vs other  Lower suspicion for septic arthritis with known hx of gout in his shoulder and DVT as likely causes of pain and swelling.   Plan for repeat LUE ultrasound to evaluate for clot propagation.  His discharge note from rehab 1 week ago stipulated he's supposed to be on eliquis but he does not have this in his medication bag and is unaware of this fact.    I personally reviewed his ECG which shows sinus tachycardia with no acute ischemic findings.  LE edema is symmetrical and appears to be pitting.  This may be due to chronic venous stasis or new onset CHF.  He is not in respiratory distress.  We'll check a BNP  I ordered, reviewed, and interpreted labs, which included Trop, BNP, CBC with diff and BMP.  Trop was 9 with > 12 hours since symptom onset, doubt this is ACS.  BNP 45 and normal.  Covid negative.  BMP with BS 193, otherwise unremarkable, Cr improved here to  1.21.  CBC with WBC 13.6, Hgb near baseline at 8.3. I ordered medication IV morphine for pain I ordered imaging studies which included xray chest and left shoulder and LUE ultrasound I independently visualized and interpreted imaging which showed age-indeterminate DVT of the LUE, no acute fx of the left shoulder, osteoarthritis of the left shoulder and A/C joint, some mild pulmonary congestion, and the monitor tracing which showed sinus tachycardia Previous records obtained and reviewed showing recent SNF discharge summary  DVT preliminary report Left:  Findings consistent with age indeterminate deep vein thrombosis involving the  left brachial veins. Findings for deep vein thrombosis appear unchanged from  previous study. Severe pain left arm , Patient refused part of Exam.        Clinical Course as of Aug 08 1652  Tue Aug 08, 2020  1343 Pt had refused PE study because of claustrophobia.  He is willing to try if we can give him some mild sedation - 2 mg versed IV ordered.  I explained his continued blood clot in LUE and that he needs to be back on eliquis.  He is unsure why he never filled that script or started taking it at home after discharge 1 week ago.   [MT]  1344 Please note the hypoxia documented is not accurate, patient repeatedly removed pulse ox partially from finger, he is 96% on room air, but does remain mildly tachycardia at HR 110 bpm   [MT]  1520 IMPRESSION: 1. Slightly limited study. Negative for pulmonary embolism to the lobar branch level. 2. Lungs are clear. 3. Probable subcentimeter right adrenal adenoma.   [MT]  6962 On reassessment, the patient has no new complaints.  His heart rate is 101, and he is 96% on room air, with BP stable in 952'W systolic.  RR 22.  I do believe he is stable for discharge.  We'll give a dose of eliquis, restarting him at 10 mg now as  he's been off the meds for at least 1 week.  I'll give him a starter pack coupon and sent a script to his  pharmacy.  He needs to f/u with his PCP this week, I made that very clear to him.  He verbalized understanding.   [MT]  7530 We also offered an arm sling for his left arm.  He has home health set up and coming tomorrow.   [MT]  1040 Update - pharmacy here reports that he DID fill a script for eliquis last week after discharge.  The patient cannot recall if he has this at home.  I gave him another prescription in case he doesn't have the meds, but explained the coupon wouldn't work if he just used one last week.     [MT]    Clinical Course User Index [MT] Wyvonnia Dusky, MD    Final Clinical Impression(s) / ED Diagnoses Final diagnoses:  Chronic left shoulder pain  Arm DVT (deep venous thromboembolism), acute, left ([place])    Rx / DC Orders ED Discharge Orders         Ordered    APIXABAN (ELIQUIS) VTE STARTER PACK (10MG AND 5MG)        08/08/20 1551           Wyvonnia Dusky, MD 08/08/20 [phone]

## 2020-08-08 NOTE — Progress Notes (Signed)
Orthopedic Tech Progress Note Patient Details:  [name] 02-Aug-1961 [id number]  Ortho Devices Ortho Device/Splint Location: sling LUE Ortho Device/Splint Interventions: Ordered, Application   Post Interventions Patient Tolerated: Well Instructions Provided: Care of device   Braulio Bosch 08/08/2020, 3:58 PM

## 2020-08-08 NOTE — Discharge Instructions (Addendum)
Your work-up in the ER today showed that you still have a large clot in your left upper arm.  This is the same clot that was seen the last time you are in the hospital.  It is very important that you take a blood thinning medicine at home to prevent this clot from getting larger or going to your lungs.  You were started on a medicine called Eliquis.  Our records show that you picked up this medicine last week, but you weren't sure if you have it at home.   If you do NOT have any eliquis, I sent a prescription to your pharmacy [facility], and also gave you a coupon to use.  If you DO have eliquis at home, continue taking it as prescribed.   It is extremely important that you follow-up with your primary care doctor this week about your ER visit.  While you are on this blood thinner, be careful not to have any falls, not to hit your head, and keep an eye on your stool to make sure there is no blood in your poop.  We did a CT scan of your lungs, which thankfully did not show any problems with your lungs or any blood clots in your lungs.  We gave you a sling for your arm comfort.  You have arthritis in your shoulder which can be causing some pain, plus this blood clot.  It will take many weeks for this clot to start to dissolve.  If you start having any sudden chest pain, difficulty breathing, feeling very winded, or lightheaded, please come back to the ER immediately.  These may be signs of a blood clot moving to your lungs.

## 2020-08-08 NOTE — ED Notes (Signed)
Patient refused CT scan. MD made aware.

## 2020-08-10 ENCOUNTER — Emergency Department (HOSPITAL_COMMUNITY): Payer: Medicare HMO

## 2020-08-10 ENCOUNTER — Other Ambulatory Visit: Payer: Self-pay

## 2020-08-10 ENCOUNTER — Encounter (HOSPITAL_COMMUNITY): Payer: Self-pay

## 2020-08-10 ENCOUNTER — Inpatient Hospital Stay (HOSPITAL_COMMUNITY)
Admission: EM | Admit: 2020-08-10 | Discharge: 2020-09-11 | DRG: 872 | Disposition: A | Discharge status: Skilled Nursing Facility | Payer: Medicare HMO | Attending: Internal Medicine | Admitting: Internal Medicine

## 2020-08-10 DIAGNOSIS — Z0389 Encounter for observation for other suspected diseases and conditions ruled out: Secondary | ICD-10-CM | POA: Diagnosis not present

## 2020-08-10 DIAGNOSIS — R140 Abdominal distension (gaseous): Secondary | ICD-10-CM | POA: Diagnosis not present

## 2020-08-10 DIAGNOSIS — D509 Iron deficiency anemia, unspecified: Secondary | ICD-10-CM | POA: Diagnosis present

## 2020-08-10 DIAGNOSIS — K560 Paralytic ileus: Secondary | ICD-10-CM | POA: Diagnosis present

## 2020-08-10 DIAGNOSIS — K760 Fatty (change of) liver, not elsewhere classified: Secondary | ICD-10-CM | POA: Diagnosis present

## 2020-08-10 DIAGNOSIS — Z978 Presence of other specified devices: Secondary | ICD-10-CM

## 2020-08-10 DIAGNOSIS — M170 Bilateral primary osteoarthritis of knee: Secondary | ICD-10-CM | POA: Diagnosis present

## 2020-08-10 DIAGNOSIS — E1140 Type 2 diabetes mellitus with diabetic neuropathy, unspecified: Secondary | ICD-10-CM | POA: Diagnosis not present

## 2020-08-10 DIAGNOSIS — R561 Post traumatic seizures: Secondary | ICD-10-CM | POA: Diagnosis not present

## 2020-08-10 DIAGNOSIS — I10 Essential (primary) hypertension: Secondary | ICD-10-CM | POA: Diagnosis not present

## 2020-08-10 DIAGNOSIS — N390 Urinary tract infection, site not specified: Secondary | ICD-10-CM | POA: Diagnosis present

## 2020-08-10 DIAGNOSIS — K6389 Other specified diseases of intestine: Secondary | ICD-10-CM | POA: Diagnosis present

## 2020-08-10 DIAGNOSIS — E872 Acidosis, unspecified: Secondary | ICD-10-CM

## 2020-08-10 DIAGNOSIS — R109 Unspecified abdominal pain: Secondary | ICD-10-CM | POA: Diagnosis not present

## 2020-08-10 DIAGNOSIS — R609 Edema, unspecified: Secondary | ICD-10-CM | POA: Diagnosis not present

## 2020-08-10 DIAGNOSIS — I248 Other forms of acute ischemic heart disease: Secondary | ICD-10-CM | POA: Diagnosis present

## 2020-08-10 DIAGNOSIS — K766 Portal hypertension: Secondary | ICD-10-CM | POA: Diagnosis present

## 2020-08-10 DIAGNOSIS — T45516A Underdosing of anticoagulants, initial encounter: Secondary | ICD-10-CM | POA: Diagnosis present

## 2020-08-10 DIAGNOSIS — Z6838 Body mass index (BMI) 38.0-38.9, adult: Secondary | ICD-10-CM | POA: Diagnosis not present

## 2020-08-10 DIAGNOSIS — Z20822 Contact with and (suspected) exposure to covid-19: Secondary | ICD-10-CM | POA: Diagnosis present

## 2020-08-10 DIAGNOSIS — Z833 Family history of diabetes mellitus: Secondary | ICD-10-CM | POA: Diagnosis not present

## 2020-08-10 DIAGNOSIS — R5383 Other fatigue: Secondary | ICD-10-CM | POA: Diagnosis not present

## 2020-08-10 DIAGNOSIS — M109 Gout, unspecified: Secondary | ICD-10-CM | POA: Diagnosis present

## 2020-08-10 DIAGNOSIS — I517 Cardiomegaly: Secondary | ICD-10-CM | POA: Diagnosis not present

## 2020-08-10 DIAGNOSIS — R778 Other specified abnormalities of plasma proteins: Secondary | ICD-10-CM | POA: Diagnosis not present

## 2020-08-10 DIAGNOSIS — E1122 Type 2 diabetes mellitus with diabetic chronic kidney disease: Secondary | ICD-10-CM | POA: Diagnosis present

## 2020-08-10 DIAGNOSIS — R9431 Abnormal electrocardiogram [ECG] [EKG]: Secondary | ICD-10-CM | POA: Diagnosis not present

## 2020-08-10 DIAGNOSIS — M6281 Muscle weakness (generalized): Secondary | ICD-10-CM | POA: Diagnosis not present

## 2020-08-10 DIAGNOSIS — R509 Fever, unspecified: Secondary | ICD-10-CM | POA: Diagnosis not present

## 2020-08-10 DIAGNOSIS — R000 Tachycardia, unspecified: Secondary | ICD-10-CM | POA: Diagnosis present

## 2020-08-10 DIAGNOSIS — R531 Weakness: Secondary | ICD-10-CM | POA: Diagnosis not present

## 2020-08-10 DIAGNOSIS — D631 Anemia in chronic kidney disease: Secondary | ICD-10-CM | POA: Diagnosis present

## 2020-08-10 DIAGNOSIS — I129 Hypertensive chronic kidney disease with stage 1 through stage 4 chronic kidney disease, or unspecified chronic kidney disease: Secondary | ICD-10-CM | POA: Diagnosis present

## 2020-08-10 DIAGNOSIS — R5381 Other malaise: Secondary | ICD-10-CM | POA: Diagnosis not present

## 2020-08-10 DIAGNOSIS — R195 Other fecal abnormalities: Secondary | ICD-10-CM | POA: Diagnosis not present

## 2020-08-10 DIAGNOSIS — R7989 Other specified abnormal findings of blood chemistry: Secondary | ICD-10-CM | POA: Diagnosis not present

## 2020-08-10 DIAGNOSIS — M47816 Spondylosis without myelopathy or radiculopathy, lumbar region: Secondary | ICD-10-CM | POA: Diagnosis not present

## 2020-08-10 DIAGNOSIS — E440 Moderate protein-calorie malnutrition: Secondary | ICD-10-CM | POA: Diagnosis not present

## 2020-08-10 DIAGNOSIS — K3189 Other diseases of stomach and duodenum: Secondary | ICD-10-CM | POA: Diagnosis not present

## 2020-08-10 DIAGNOSIS — Z7401 Bed confinement status: Secondary | ICD-10-CM | POA: Diagnosis not present

## 2020-08-10 DIAGNOSIS — Z91128 Patient's intentional underdosing of medication regimen for other reason: Secondary | ICD-10-CM | POA: Diagnosis not present

## 2020-08-10 DIAGNOSIS — K449 Diaphragmatic hernia without obstruction or gangrene: Secondary | ICD-10-CM | POA: Diagnosis not present

## 2020-08-10 DIAGNOSIS — E119 Type 2 diabetes mellitus without complications: Secondary | ICD-10-CM | POA: Diagnosis not present

## 2020-08-10 DIAGNOSIS — E785 Hyperlipidemia, unspecified: Secondary | ICD-10-CM | POA: Diagnosis present

## 2020-08-10 DIAGNOSIS — Y929 Unspecified place or not applicable: Secondary | ICD-10-CM | POA: Diagnosis not present

## 2020-08-10 DIAGNOSIS — A4151 Sepsis due to Escherichia coli [E. coli]: Principal | ICD-10-CM

## 2020-08-10 DIAGNOSIS — R0902 Hypoxemia: Secondary | ICD-10-CM | POA: Diagnosis not present

## 2020-08-10 DIAGNOSIS — Z7901 Long term (current) use of anticoagulants: Secondary | ICD-10-CM | POA: Diagnosis not present

## 2020-08-10 DIAGNOSIS — Z4682 Encounter for fitting and adjustment of non-vascular catheter: Secondary | ICD-10-CM | POA: Diagnosis not present

## 2020-08-10 DIAGNOSIS — J9811 Atelectasis: Secondary | ICD-10-CM | POA: Diagnosis not present

## 2020-08-10 DIAGNOSIS — L602 Onychogryphosis: Secondary | ICD-10-CM | POA: Diagnosis present

## 2020-08-10 DIAGNOSIS — E1165 Type 2 diabetes mellitus with hyperglycemia: Secondary | ICD-10-CM | POA: Diagnosis not present

## 2020-08-10 DIAGNOSIS — I509 Heart failure, unspecified: Secondary | ICD-10-CM | POA: Diagnosis not present

## 2020-08-10 DIAGNOSIS — M25511 Pain in right shoulder: Secondary | ICD-10-CM | POA: Diagnosis present

## 2020-08-10 DIAGNOSIS — K5939 Other megacolon: Secondary | ICD-10-CM | POA: Diagnosis not present

## 2020-08-10 DIAGNOSIS — Z794 Long term (current) use of insulin: Secondary | ICD-10-CM

## 2020-08-10 DIAGNOSIS — Z79899 Other long term (current) drug therapy: Secondary | ICD-10-CM | POA: Diagnosis not present

## 2020-08-10 DIAGNOSIS — R6510 Systemic inflammatory response syndrome (SIRS) of non-infectious origin without acute organ dysfunction: Secondary | ICD-10-CM | POA: Diagnosis not present

## 2020-08-10 DIAGNOSIS — N182 Chronic kidney disease, stage 2 (mild): Secondary | ICD-10-CM

## 2020-08-10 DIAGNOSIS — B351 Tinea unguium: Secondary | ICD-10-CM | POA: Diagnosis present

## 2020-08-10 DIAGNOSIS — I82622 Acute embolism and thrombosis of deep veins of left upper extremity: Secondary | ICD-10-CM | POA: Diagnosis present

## 2020-08-10 DIAGNOSIS — E1142 Type 2 diabetes mellitus with diabetic polyneuropathy: Secondary | ICD-10-CM | POA: Diagnosis present

## 2020-08-10 DIAGNOSIS — K5981 Ogilvie syndrome: Secondary | ICD-10-CM | POA: Diagnosis not present

## 2020-08-10 DIAGNOSIS — K567 Ileus, unspecified: Secondary | ICD-10-CM

## 2020-08-10 DIAGNOSIS — K219 Gastro-esophageal reflux disease without esophagitis: Secondary | ICD-10-CM

## 2020-08-10 DIAGNOSIS — E876 Hypokalemia: Secondary | ICD-10-CM | POA: Diagnosis not present

## 2020-08-10 DIAGNOSIS — Z86718 Personal history of other venous thrombosis and embolism: Secondary | ICD-10-CM

## 2020-08-10 DIAGNOSIS — B965 Pseudomonas (aeruginosa) (mallei) (pseudomallei) as the cause of diseases classified elsewhere: Secondary | ICD-10-CM | POA: Diagnosis not present

## 2020-08-10 DIAGNOSIS — K56609 Unspecified intestinal obstruction, unspecified as to partial versus complete obstruction: Secondary | ICD-10-CM | POA: Diagnosis not present

## 2020-08-10 DIAGNOSIS — M1000 Idiopathic gout, unspecified site: Secondary | ICD-10-CM | POA: Diagnosis not present

## 2020-08-10 DIAGNOSIS — R52 Pain, unspecified: Secondary | ICD-10-CM | POA: Diagnosis not present

## 2020-08-10 DIAGNOSIS — M2550 Pain in unspecified joint: Secondary | ICD-10-CM | POA: Diagnosis not present

## 2020-08-10 DIAGNOSIS — M10041 Idiopathic gout, right hand: Secondary | ICD-10-CM | POA: Diagnosis not present

## 2020-08-10 DIAGNOSIS — R933 Abnormal findings on diagnostic imaging of other parts of digestive tract: Secondary | ICD-10-CM | POA: Diagnosis not present

## 2020-08-10 LAB — RESPIRATORY PANEL BY RT PCR (FLU A&B, COVID)
Influenza A by PCR: NEGATIVE
Influenza B by PCR: NEGATIVE
SARS Coronavirus 2 by RT PCR: NEGATIVE

## 2020-08-10 LAB — CBC WITH DIFFERENTIAL/PLATELET
Abs Immature Granulocytes: 0.05 10*3/uL (ref 0.00–0.07)
Basophils Absolute: 0 10*3/uL (ref 0.0–0.1)
Basophils Relative: 0 %
Eosinophils Absolute: 0 10*3/uL (ref 0.0–0.5)
Eosinophils Relative: 0 %
HCT: 28.3 % — ABNORMAL LOW (ref 39.0–52.0)
Hemoglobin: 8.9 g/dL — ABNORMAL LOW (ref 13.0–17.0)
Immature Granulocytes: 1 %
Lymphocytes Relative: 7 %
Lymphs Abs: 0.7 10*3/uL (ref 0.7–4.0)
MCH: 24.2 pg — ABNORMAL LOW (ref 26.0–34.0)
MCHC: 31.4 g/dL (ref 30.0–36.0)
MCV: 76.9 fL — ABNORMAL LOW (ref 80.0–100.0)
Monocytes Absolute: 0.8 10*3/uL (ref 0.1–1.0)
Monocytes Relative: 8 %
Neutro Abs: 9.2 10*3/uL — ABNORMAL HIGH (ref 1.7–7.7)
Neutrophils Relative %: 84 %
Platelets: 249 10*3/uL (ref 150–400)
RBC: 3.68 MIL/uL — ABNORMAL LOW (ref 4.22–5.81)
RDW: 19.4 % — ABNORMAL HIGH (ref 11.5–15.5)
WBC: 10.8 10*3/uL — ABNORMAL HIGH (ref 4.0–10.5)
nRBC: 0 % (ref 0.0–0.2)

## 2020-08-10 LAB — COMPREHENSIVE METABOLIC PANEL
ALT: 36 U/L (ref 0–44)
AST: 48 U/L — ABNORMAL HIGH (ref 15–41)
Albumin: 2.7 g/dL — ABNORMAL LOW (ref 3.5–5.0)
Alkaline Phosphatase: 103 U/L (ref 38–126)
Anion gap: 14 (ref 5–15)
BUN: 15 mg/dL (ref 6–20)
CO2: 20 mmol/L — ABNORMAL LOW (ref 22–32)
Calcium: 8 mg/dL — ABNORMAL LOW (ref 8.9–10.3)
Chloride: 102 mmol/L (ref 98–111)
Creatinine, Ser: 1.09 mg/dL (ref 0.61–1.24)
GFR calc Af Amer: >60 mL/min (ref >60)
GFR calc non Af Amer: >60 mL/min (ref >60)
Glucose, Bld: 189 mg/dL — ABNORMAL HIGH (ref 70–99)
Potassium: 3.6 mmol/L (ref 3.5–5.1)
Sodium: 136 mmol/L (ref 135–145)
Total Bilirubin: 1.6 mg/dL — ABNORMAL HIGH (ref 0.3–1.2)
Total Protein: 6.9 g/dL (ref 6.5–8.1)

## 2020-08-10 LAB — PROTIME-INR
INR: 1.5 — ABNORMAL HIGH (ref 0.8–1.2)
Prothrombin Time: 17.7 seconds — ABNORMAL HIGH (ref 11.4–15.2)

## 2020-08-10 LAB — BRAIN NATRIURETIC PEPTIDE: B Natriuretic Peptide: 186.4 pg/mL — ABNORMAL HIGH (ref 0.0–100.0)

## 2020-08-10 LAB — TROPONIN I (HIGH SENSITIVITY)
Troponin I (High Sensitivity): 113 ng/L (ref <18)
Troponin I (High Sensitivity): 126 ng/L (ref <18)

## 2020-08-10 LAB — APTT: aPTT: 52 seconds — ABNORMAL HIGH (ref 24–36)

## 2020-08-10 LAB — CK: Total CK: 101 U/L (ref 49–397)

## 2020-08-10 LAB — LACTIC ACID, PLASMA: Lactic Acid, Venous: 1.5 mmol/L (ref 0.5–1.9)

## 2020-08-10 MED ORDER — ACETAMINOPHEN 325 MG PO TABS
650.0000 mg | ORAL_TABLET | Freq: Once | ORAL | Status: AC
  Administered 2020-08-10: 650 mg via ORAL
  Filled 2020-08-10: qty 2

## 2020-08-10 MED ORDER — ASPIRIN 81 MG PO CHEW
324.0000 mg | CHEWABLE_TABLET | Freq: Once | ORAL | Status: AC
  Administered 2020-08-10: 324 mg via ORAL
  Filled 2020-08-10: qty 4

## 2020-08-10 MED ORDER — LACTATED RINGERS IV BOLUS
1000.0000 mL | Freq: Once | INTRAVENOUS | Status: AC
  Administered 2020-08-10: 1000 mL via INTRAVENOUS

## 2020-08-10 MED ORDER — NITROGLYCERIN 2 % TD OINT
1.0000 [in_us] | TOPICAL_OINTMENT | Freq: Once | TRANSDERMAL | Status: AC
  Administered 2020-08-11: 1 [in_us] via TOPICAL
  Filled 2020-08-10: qty 1

## 2020-08-10 MED ORDER — IOHEXOL 350 MG/ML SOLN
100.0000 mL | Freq: Once | INTRAVENOUS | Status: AC | PRN
  Administered 2020-08-10: 100 mL via INTRAVENOUS

## 2020-08-10 MED ORDER — ENOXAPARIN SODIUM 120 MG/0.8ML ~~LOC~~ SOLN
110.0000 mg | Freq: Once | SUBCUTANEOUS | Status: AC
  Administered 2020-08-10: 110 mg via SUBCUTANEOUS
  Filled 2020-08-10: qty 0.74

## 2020-08-10 NOTE — ED Triage Notes (Signed)
Patient in from home due to the complaints of leg swelling and being unable to walk, was recently discharged from rehab last Thursday and feels that he needs to go back in order to get stronger.

## 2020-08-10 NOTE — ED Provider Notes (Signed)
[facility] DEPT Provider Note   CSN: [id number] Arrival date & time: 08/10/20  1858     History Chief Complaint  Patient presents with  . Leg Swelling  . Medical Clearance    rehab placement     [name] is a 59 y.o. male.  Patient has multiple complaints and has difficulty conveying why he specifically came today.  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  Is supposed to be taking anticoagulation but has not been.  Comes today with aches and pains all over.  He is feeling chills.  No nausea vomiting no chest pain or shortness of breath.        Past Medical History:  Diagnosis Date  . Gouty bursitis, elbow  10/18/2015  . Hyperlipidemia   . Hypertension   . Knee pain, chronic   . Type II diabetes mellitus [facility])     Patient Active Problem List   Diagnosis Date Noted  . Nausea and vomiting 07/18/2020  . Tachycardia 07/11/2020  . Neurocognitive deficits 07/11/2020  . Non-traumatic rhabdomyolysis   . Arthralgia   . Left elbow pain   . Left hand pain   . AKI (acute kidney injury) ([place]) 06/28/2020  . Pain due to onychomycosis of toenails of both feet 12/17/2019  . Class 3 severe obesity due to excess calories with serious comorbidity and body mass index (BMI) of 45.0 to 49.9 in adult ([place]) 09/23/2018  . Diabetic neuropathy ([place]) 09/08/2017  . Insomnia 12/26/2016  . Scrotal abscess 10/09/2016  . Acute meniscal tear of right knee 12/29/2015  . Acute meniscal tear of left knee 12/29/2015  . Other specified crystal arthropathies, left knee 12/29/2015  . Other specified crystal arthropathies, right knee 12/29/2015  . Arthritis of knee   . Inability to ambulate due to knee   . Microcytic anemia   . Bilateral knee pain 12/23/2015  . Hypertension 12/23/2015  . Chronic mid back pain 12/23/2015  . Gout 12/23/2015  . Osteoarthritis of both knees 12/23/2015  . Intractable pain 12/23/2015  . Normocytic  anemia 12/23/2015  . Gouty bursitis, elbow  10/18/2015  . DM (diabetes mellitus), type 2 with neurological complications ([place]) 70/62/3762    Past Surgical History:  Procedure Laterality Date  . COLONOSCOPY    . KNEE ARTHROSCOPY Bilateral 12/29/2015   Procedure: ARTHROSCOPY KNEE BILATERAL WITH  PARTIAL MEDIAL AND LATERAL MENISCECTOMY AND FOUR COMPARTMENT SYNOVECTOMY , CHONDRALPLASTIES, PATELLA-FEMORAL CHONDRALPLASTIES BILATERALLY;  Surgeon: Dorna Leitz, MD;  Location: [facility];  Service: Orthopedics;  Laterality: Bilateral;       Family History  Problem Relation Age of Onset  . Diabetes Mother   . Diabetes Father     Social History   Tobacco Use  . Smoking status: Never Smoker  . Smokeless tobacco: Never Used  Vaping Use  . Vaping Use: Never used  Substance Use Topics  . Alcohol use: No  . Drug use: No    Home Medications Prior to Admission medications   Medication Sig Start Date End Date Taking? Authorizing Provider  Accu-Chek FastClix Lancets MISC 1 each by Does not apply route 3 (three) times daily. as directed 02/09/20  Yes Charlott Rakes, MD  Alcohol Swabs (B-D SINGLE USE SWABS REGULAR) PADS Please use as instructed before checking blood sugar and before injecting insulin. 02/09/20  Yes Charlott Rakes, MD  allopurinol (ZYLOPRIM) 300 MG tablet Take 1 tablet (300 mg total) by mouth daily. 08/01/20  Yes  Medina-Vargas, Monina C, NP  apixaban (ELIQUIS) 5 MG TABS tablet Take 1 tablet (5 mg total) by mouth 2 (two) times daily. 08/01/20   Medina-Vargas, Monina C, NP  APIXABAN (ELIQUIS) VTE STARTER PACK (10MG AND 5MG) Take as directed on package: start with two-22m tablets twice daily for 7 days. On day 8, switch to one-541mtablet twice daily. 08/08/20   TrWyvonnia DuskyMD  atorvastatin (LIPITOR) 40 MG tablet Take 1 tablet (40 mg total) by mouth daily at 6 PM. 08/01/20   Medina-Vargas, Monina C, NP  Blood Glucose Calibration (ACCU-CHEK AVIVA) SOLN Use to calibrate your glucometer when  needed. Patient not taking: Reported on 07/11/2020 02/09/20   NeCharlott RakesMD  Blood Glucose Monitoring Suppl (ACCU-CHEK AVIVA PLUS) w/Device KIT USE AS INSTRUCTED THREE TIMES DAILY. E11.69 Patient not taking: Reported on 07/11/2020 02/09/20   NeCharlott RakesMD  cyclobenzaprine (FLEXERIL) 10 MG tablet Take 1 tablet (10 mg total) by mouth 3 (three) times daily as needed. For Muscle Pain 08/01/20   Medina-Vargas, Monina C, NP  diclofenac Sodium (VOLTAREN) 1 % GEL Apply 2 g topically 4 (four) times daily. 08/01/20   Medina-Vargas, Monina C, NP  DROPLET PEN NEEDLES 32G X 4 MM MISC USE AS DIRECTED DAILY TO ADMINISTER VICTOZA Patient not taking: Reported on 07/11/2020 05/25/20   NeCharlott RakesMD  ferrous sulfate 325 (65 FE) MG tablet Take 1 tablet (325 mg total) by mouth daily with breakfast. For Iron Deficiency Anemia 08/01/20   Medina-Vargas, Monina C, NP  glucose blood (ACCU-CHEK AVIVA PLUS) test strip USE AS INSTRUCTED THREE TIMES DAILY. E11.69 Patient not taking: Reported on 07/11/2020 02/09/20   NeCharlott RakesMD  Insulin Pen Needle (PEN NEEDLES) 30G X 8 MM MISC Use as directed Patient not taking: Reported on 07/11/2020 07/11/20   WoAllie BossierMD  Magnesium Oxide 200 MG TABS Take 2 tablets (400 mg total) by mouth daily. Take 2 tabs to = 400 mg 08/01/20   Medina-Vargas, Monina C, NP  metoCLOPramide (REGLAN) 5 MG/5ML solution Take 5 mLs (5 mg total) by mouth 3 (three) times daily before meals. 08/01/20   Medina-Vargas, Monina C, NP  metoprolol succinate (TOPROL-XL) 50 MG 24 hr tablet Take 1 tablet (50 mg total) by mouth daily. Take with or immediately following a meal. 08/01/20   Medina-Vargas, Monina C, NP  NON FORMULARY Diet- Renal / CCD Regular Diet 07/12/20   [provider]  pantoprazole (PROTONIX) 40 MG tablet Take 1 tablet (40 mg total) by mouth at bedtime. 08/01/20   Medina-Vargas, Monina C, NP  traZODone (DESYREL) 50 MG tablet Take 1 tablet (50 mg total) by mouth at bedtime. 08/01/20    Medina-Vargas, Monina C, NP    Allergies    Hctz [hydrochlorothiazide]  Review of Systems   Review of Systems  Constitutional: Positive for chills. Negative for fever.  HENT: Negative for congestion and rhinorrhea.   Respiratory: Negative for cough and shortness of breath.   Cardiovascular: Negative for chest pain and palpitations.  Gastrointestinal: Negative for diarrhea, nausea and vomiting.  Genitourinary: Negative for difficulty urinating and dysuria.  Musculoskeletal: Positive for arthralgias and myalgias. Negative for back pain.  Skin: Negative for color change and rash.  Neurological: Negative for light-headedness and headaches.    Physical Exam Updated Vital Signs BP (!) 163/100   Pulse (!) 103   Temp 98.7 F (37.1 C) (Oral)   Resp (!) 30   SpO2 97%   Physical Exam Vitals and nursing note reviewed.  Constitutional:      General: He is not in acute distress.    Appearance: Normal appearance.  HENT:     Head: Normocephalic and atraumatic.     Nose: No rhinorrhea.  Eyes:     General:        Right eye: No discharge.        Left eye: No discharge.     Conjunctiva/sclera: Conjunctivae normal.  Cardiovascular:     Rate and Rhythm: Regular rhythm. Tachycardia present.  Pulmonary:     Effort: Pulmonary effort is normal. No respiratory distress.     Breath sounds: No stridor. No wheezing.  Abdominal:     General: Abdomen is flat. There is no distension.     Palpations: Abdomen is soft.     Tenderness: There is no abdominal tenderness.  Musculoskeletal:        General: No deformity or signs of injury.     Right lower leg: No edema.     Left lower leg: No edema.     Comments: Patient has obese body habitus but no pitting on his exam.  He has tenderness to palpation of most of his muscle groups in the extremities to include his left upper extremity where the DVT is.  He has pulses intact in all extremities sensation intact  Skin:    General: Skin is warm and dry.      Capillary Refill: Capillary refill takes less than 2 seconds.  Neurological:     General: No focal deficit present.     Mental Status: He is alert. Mental status is at baseline.     Motor: No weakness.  Psychiatric:        Mood and Affect: Mood normal.        Behavior: Behavior normal.        Thought Content: Thought content normal.     ED Results / Procedures / Treatments   Labs (all labs ordered are listed, but only abnormal results are displayed) Labs Reviewed  COMPREHENSIVE METABOLIC PANEL - Abnormal; Notable for the following components:      Result Value   CO2 20 (*)    Glucose, Bld 189 (*)    Calcium 8.0 (*)    Albumin 2.7 (*)    AST 48 (*)    Total Bilirubin 1.6 (*)    All other components within normal limits  CBC WITH DIFFERENTIAL/PLATELET - Abnormal; Notable for the following components:   WBC 10.8 (*)    RBC 3.68 (*)    Hemoglobin 8.9 (*)    HCT 28.3 (*)    MCV 76.9 (*)    MCH 24.2 (*)    RDW 19.4 (*)    Neutro Abs 9.2 (*)    All other components within normal limits  PROTIME-INR - Abnormal; Notable for the following components:   Prothrombin Time 17.7 (*)    INR 1.5 (*)    All other components within normal limits  APTT - Abnormal; Notable for the following components:   aPTT 52 (*)    All other components within normal limits  BRAIN NATRIURETIC PEPTIDE - Abnormal; Notable for the following components:   B Natriuretic Peptide 186.4 (*)    All other components within normal limits  TROPONIN I (HIGH SENSITIVITY) - Abnormal; Notable for the following components:   Troponin I (High Sensitivity) 113 (*)    All other components within normal limits  TROPONIN I (HIGH SENSITIVITY) - Abnormal; Notable for the following components:  Troponin I (High Sensitivity) 126 (*)    All other components within normal limits  RESPIRATORY PANEL BY RT PCR (FLU A&B, COVID)  URINE CULTURE  CULTURE, BLOOD (ROUTINE X 2)  CULTURE, BLOOD (ROUTINE X 2)  LACTIC ACID, PLASMA   CK  URINALYSIS, ROUTINE W REFLEX MICROSCOPIC    EKG EKG Interpretation  Date/Time:  Thursday August 10 2020 20:11:01 EDT Ventricular Rate:  108 PR Interval:    QRS Duration: 74 QT Interval:  350 QTC Calculation: 470 R Axis:   -65 Text Interpretation: Sinus tachycardia Atrial premature complex Left axis deviation Low voltage, extremity leads Abnormal R-wave progression, late transition Baseline wander in lead(s) V2 12 Lead; [place] Confirmed by Dewaine Conger [phone]) on 08/10/2020 8:15:43 PM   Radiology CT Angio Chest PE W and/or Wo Contrast  Result Date: 08/10/2020 CLINICAL DATA:  PE suspected, high prob known dvt in the arm and has elevated trop today EXAM: CT ANGIOGRAPHY CHEST WITH CONTRAST TECHNIQUE: Multidetector CT imaging of the chest was performed using the standard protocol during bolus administration of intravenous contrast. Multiplanar CT image reconstructions and MIPs were obtained to evaluate the vascular anatomy. CONTRAST:  140m OMNIPAQUE IOHEXOL 350 MG/ML SOLN COMPARISON:  Chest CTA 2 days ago 08/08/2020 FINDINGS: Cardiovascular: Examination is again moderately motion degraded. There are no filling defects within the central pulmonary arteries to the lobar level to suggest pulmonary embolus. Segmental and subsegmental evaluation is limited, particularly in the lower lobes. No aortic dissection or aneurysm. Borderline cardiomegaly. No pericardial effusion. Mediastinum/Nodes: No enlarged mediastinal or hilar lymph nodes. No suspicious thyroid nodule. No esophageal wall thickening. Tiny hiatal hernia. Lungs/Pleura: Mild dependent atelectasis in the right greater than left lung. Motion degrades detailed assessment. No confluent airspace disease. No findings of pulmonary edema. No pleural fluid. Trachea and central bronchi are patent. Upper Abdomen: Partially included gaseous distension of transverse colon. No colonic wall thickening. Musculoskeletal: There are no acute or  suspicious osseous abnormalities. Again seen diffuse degenerative change in the spine. Review of the MIP images confirms the above findings. IMPRESSION: 1. Motion limited examination. No evidence of pulmonary embolus to the lobar level. 2. Mild dependent atelectasis. Electronically Signed   By: MKeith RakeM.D.   On: 08/10/2020 21:50   DG Chest Port 1 View  Result Date: 08/10/2020 CLINICAL DATA:  Questionable sepsis evaluate for cause EXAM: PORTABLE CHEST 1 VIEW COMPARISON:  08/08/2020 FINDINGS: Trachea midline. Cardiomediastinal contours and hilar structures are normal. Lungs are clear. No lobar consolidation. No sign of pleural effusion. Limited assessment of skeletal structures without acute process. IMPRESSION: No acute cardiopulmonary disease. Electronically Signed   By: GZetta BillsM.D.   On: 08/10/2020 20:37    Procedures Procedures (including critical care time)  Medications Ordered in ED Medications  nitroGLYCERIN (NITROGLYN) 2 % ointment 1 inch (has no administration in time range)  acetaminophen (TYLENOL) tablet 650 mg (650 mg Oral Given 08/10/20 1958)  lactated ringers bolus 1,000 mL (0 mLs Intravenous Stopped 08/10/20 2154)  iohexol (OMNIPAQUE) 350 MG/ML injection 100 mL (100 mLs Intravenous Contrast Given 08/10/20 2130)  aspirin chewable tablet 324 mg (324 mg Oral Given 08/10/20 2200)  enoxaparin (LOVENOX) injection 110 mg (110 mg Subcutaneous Given 08/10/20 2234)    ED Course  I have reviewed the triage vital signs and the nursing notes.  Pertinent labs & imaging results that were available during my care of the patient were reviewed by me and considered in my medical decision making (see chart for details).  MDM Rules/Calculators/A&P                          Concern for known DVT with no anticoagulation now tachycardic and febrile.  Infection of unknown source possibly worsening PE.  He will get repeat CT PE study.  His troponin is elevated.  This may be due to  strain on the heart secondary to blood clot may be myocardial injury versus ischemia.  Aspirin and Lovenox is given.  His EKG shows sinus tachycardia with no significant interval abnormalities.  No significant acute ischemic changes.  He fluids.  His creatinine is stable.  Glucose mildly elevated.  CK is negative.  He will get a BNP and a repeat troponin.  He still waiting for viral testing.  No significant leukocytosis.  No significant anemia from baseline.  Antipyretics given have his temperature down heart rate is slightly improved.  Troponin is elevated, repeat is elevated as well. With elevated troponin and history of DVT we will get a PET scan, PET scan reviewed by radiology myself shows no acute PE and no signs of significant volume overload or fluid consistent with chest x-ray. Continues to be mildly hypertensive, only antihypertensive at home with metoprolol. Will avoid beta-blocker right now. Will put Nitropaste on. BNP is mildly elevated. No signs of volume overload on exam though he says he is swollen there is no pitting. He is given Lovenox and aspirin as well. Cultures of blood and urine are sent but no source of infection at this time. Patient will require admission for further management of his elevated troponin and symptoms.  The patient will be admitted to the hospitalist.  For the remainder this patient's care please see inpatient team notes.  I will intervene as needed while the patient remains in the emergency department.     Final Clinical Impression(s) / ED Diagnoses Final diagnoses:  Elevated troponin  Fatigue, unspecified type  Tachycardia    Rx / DC Orders ED Discharge Orders    None       Breck Coons, MD 08/10/20 [phone]

## 2020-08-10 NOTE — ED Notes (Signed)
Unable to collect second blood culture at this time.

## 2020-08-11 ENCOUNTER — Encounter (HOSPITAL_COMMUNITY): Payer: Self-pay | Admitting: Internal Medicine

## 2020-08-11 ENCOUNTER — Observation Stay (HOSPITAL_COMMUNITY): Payer: Medicare HMO

## 2020-08-11 DIAGNOSIS — R778 Other specified abnormalities of plasma proteins: Secondary | ICD-10-CM | POA: Diagnosis present

## 2020-08-11 DIAGNOSIS — E1165 Type 2 diabetes mellitus with hyperglycemia: Secondary | ICD-10-CM

## 2020-08-11 DIAGNOSIS — D509 Iron deficiency anemia, unspecified: Secondary | ICD-10-CM | POA: Diagnosis not present

## 2020-08-11 DIAGNOSIS — I10 Essential (primary) hypertension: Secondary | ICD-10-CM | POA: Diagnosis not present

## 2020-08-11 DIAGNOSIS — I82622 Acute embolism and thrombosis of deep veins of left upper extremity: Secondary | ICD-10-CM | POA: Diagnosis present

## 2020-08-11 DIAGNOSIS — R531 Weakness: Secondary | ICD-10-CM | POA: Diagnosis not present

## 2020-08-11 DIAGNOSIS — R6510 Systemic inflammatory response syndrome (SIRS) of non-infectious origin without acute organ dysfunction: Secondary | ICD-10-CM | POA: Diagnosis not present

## 2020-08-11 DIAGNOSIS — I509 Heart failure, unspecified: Secondary | ICD-10-CM

## 2020-08-11 LAB — COMPREHENSIVE METABOLIC PANEL
ALT: 34 U/L (ref 0–44)
AST: 43 U/L — ABNORMAL HIGH (ref 15–41)
Albumin: 2.4 g/dL — ABNORMAL LOW (ref 3.5–5.0)
Alkaline Phosphatase: 91 U/L (ref 38–126)
Anion gap: 14 (ref 5–15)
BUN: 15 mg/dL (ref 6–20)
CO2: 21 mmol/L — ABNORMAL LOW (ref 22–32)
Calcium: 7.8 mg/dL — ABNORMAL LOW (ref 8.9–10.3)
Chloride: 103 mmol/L (ref 98–111)
Creatinine, Ser: 1.04 mg/dL (ref 0.61–1.24)
GFR calc Af Amer: >60 mL/min (ref >60)
GFR calc non Af Amer: >60 mL/min (ref >60)
Glucose, Bld: 176 mg/dL — ABNORMAL HIGH (ref 70–99)
Potassium: 3.4 mmol/L — ABNORMAL LOW (ref 3.5–5.1)
Sodium: 138 mmol/L (ref 135–145)
Total Bilirubin: 1.3 mg/dL — ABNORMAL HIGH (ref 0.3–1.2)
Total Protein: 6.4 g/dL — ABNORMAL LOW (ref 6.5–8.1)

## 2020-08-11 LAB — HIV ANTIBODY (ROUTINE TESTING W REFLEX): HIV Screen 4th Generation wRfx: NONREACTIVE

## 2020-08-11 LAB — CBC WITH DIFFERENTIAL/PLATELET
Abs Immature Granulocytes: 0.04 10*3/uL (ref 0.00–0.07)
Basophils Absolute: 0 10*3/uL (ref 0.0–0.1)
Basophils Relative: 0 %
Eosinophils Absolute: 0 10*3/uL (ref 0.0–0.5)
Eosinophils Relative: 0 %
HCT: 25.9 % — ABNORMAL LOW (ref 39.0–52.0)
Hemoglobin: 8 g/dL — ABNORMAL LOW (ref 13.0–17.0)
Immature Granulocytes: 0 %
Lymphocytes Relative: 11 %
Lymphs Abs: 1.1 10*3/uL (ref 0.7–4.0)
MCH: 24.2 pg — ABNORMAL LOW (ref 26.0–34.0)
MCHC: 30.9 g/dL (ref 30.0–36.0)
MCV: 78.2 fL — ABNORMAL LOW (ref 80.0–100.0)
Monocytes Absolute: 1 10*3/uL (ref 0.1–1.0)
Monocytes Relative: 10 %
Neutro Abs: 8.1 10*3/uL — ABNORMAL HIGH (ref 1.7–7.7)
Neutrophils Relative %: 79 %
Platelets: 250 10*3/uL (ref 150–400)
RBC: 3.31 MIL/uL — ABNORMAL LOW (ref 4.22–5.81)
RDW: 19.6 % — ABNORMAL HIGH (ref 11.5–15.5)
WBC: 10.3 10*3/uL (ref 4.0–10.5)
nRBC: 0 % (ref 0.0–0.2)

## 2020-08-11 LAB — URINALYSIS, ROUTINE W REFLEX MICROSCOPIC
Bilirubin Urine: NEGATIVE
Glucose, UA: NEGATIVE mg/dL
Ketones, ur: 20 mg/dL — AB
Leukocytes,Ua: NEGATIVE
Nitrite: NEGATIVE
Protein, ur: 100 mg/dL — AB
Specific Gravity, Urine: 1.038 — ABNORMAL HIGH (ref 1.005–1.030)
pH: 5 (ref 5.0–8.0)

## 2020-08-11 LAB — GLUCOSE, CAPILLARY
Glucose-Capillary: 174 mg/dL — ABNORMAL HIGH (ref 70–99)
Glucose-Capillary: 159 mg/dL — ABNORMAL HIGH (ref 70–99)
Glucose-Capillary: 164 mg/dL — ABNORMAL HIGH (ref 70–99)
Glucose-Capillary: 154 mg/dL — ABNORMAL HIGH (ref 70–99)
Glucose-Capillary: 158 mg/dL — ABNORMAL HIGH (ref 70–99)

## 2020-08-11 LAB — IRON AND TIBC
Iron: 17 ug/dL — ABNORMAL LOW (ref 45–182)
Saturation Ratios: 13 % — ABNORMAL LOW (ref 17.9–39.5)
TIBC: 131 ug/dL — ABNORMAL LOW (ref 250–450)
UIBC: 114 ug/dL

## 2020-08-11 LAB — ECHOCARDIOGRAM COMPLETE
Height: 65 in
S' Lateral: 2.8 cm
Weight: 3746.06 oz

## 2020-08-11 LAB — MAGNESIUM: Magnesium: 1.5 mg/dL — ABNORMAL LOW (ref 1.7–2.4)

## 2020-08-11 LAB — TROPONIN I (HIGH SENSITIVITY): Troponin I (High Sensitivity): 70 ng/L — ABNORMAL HIGH (ref <18)

## 2020-08-11 LAB — PROCALCITONIN: Procalcitonin: 9.07 ng/mL

## 2020-08-11 LAB — URIC ACID: Uric Acid, Serum: 7.6 mg/dL (ref 3.7–8.6)

## 2020-08-11 LAB — CORTISOL-AM, BLOOD: Cortisol - AM: 20.3 ug/dL (ref 6.7–22.6)

## 2020-08-11 MED ORDER — CYCLOBENZAPRINE HCL 10 MG PO TABS
10.0000 mg | ORAL_TABLET | Freq: Three times a day (TID) | ORAL | Status: DC | PRN
  Administered 2020-08-12: 10 mg via ORAL
  Filled 2020-08-11: qty 1

## 2020-08-11 MED ORDER — ACETAMINOPHEN 650 MG RE SUPP: 650.0000 mg | Freq: Four times a day (QID) | RECTAL | Status: DC | PRN

## 2020-08-11 MED ORDER — LACTATED RINGERS IV BOLUS
500.0000 mL | Freq: Once | INTRAVENOUS | Status: AC
  Administered 2020-08-11: 500 mL via INTRAVENOUS

## 2020-08-11 MED ORDER — ONDANSETRON HCL 4 MG/2ML IJ SOLN
4.0000 mg | Freq: Four times a day (QID) | INTRAMUSCULAR | Status: DC | PRN
  Administered 2020-08-12 – 2020-09-05 (×4): 4 mg via INTRAVENOUS
  Filled 2020-08-11 (×5): qty 2

## 2020-08-11 MED ORDER — APIXABAN 5 MG PO TABS: 5.0000 mg | ORAL_TABLET | Freq: Two times a day (BID) | ORAL | Status: DC

## 2020-08-11 MED ORDER — CLONIDINE HCL 0.1 MG PO TABS
0.1000 mg | ORAL_TABLET | Freq: Two times a day (BID) | ORAL | Status: DC | PRN
  Administered 2020-08-11 – 2020-08-14 (×5): 0.1 mg via ORAL
  Filled 2020-08-11 (×5): qty 1

## 2020-08-11 MED ORDER — POLYETHYLENE GLYCOL 3350 17 G PO PACK: 17.0000 g | PACK | Freq: Every day | ORAL | Status: DC | PRN

## 2020-08-11 MED ORDER — APIXABAN 5 MG PO TABS: 10.0000 mg | ORAL_TABLET | Freq: Two times a day (BID) | ORAL | Status: DC

## 2020-08-11 MED ORDER — MAGNESIUM OXIDE 400 (241.3 MG) MG PO TABS
400.0000 mg | ORAL_TABLET | Freq: Every day | ORAL | Status: DC
  Administered 2020-08-11 – 2020-08-14 (×4): 400 mg via ORAL
  Filled 2020-08-11 (×4): qty 1

## 2020-08-11 MED ORDER — ALLOPURINOL 300 MG PO TABS
300.0000 mg | ORAL_TABLET | Freq: Every day | ORAL | Status: DC
  Administered 2020-08-11: 300 mg via ORAL
  Filled 2020-08-11: qty 1

## 2020-08-11 MED ORDER — PANTOPRAZOLE SODIUM 40 MG PO TBEC
40.0000 mg | DELAYED_RELEASE_TABLET | Freq: Every day | ORAL | Status: DC
  Administered 2020-08-11 – 2020-08-13 (×3): 40 mg via ORAL
  Filled 2020-08-11 (×3): qty 1

## 2020-08-11 MED ORDER — COLCHICINE 0.6 MG PO TABS
0.6000 mg | ORAL_TABLET | Freq: Two times a day (BID) | ORAL | Status: DC
  Administered 2020-08-11 – 2020-08-14 (×7): 0.6 mg via ORAL
  Filled 2020-08-11 (×8): qty 1

## 2020-08-11 MED ORDER — TRAZODONE HCL 100 MG PO TABS
50.0000 mg | ORAL_TABLET | Freq: Every day | ORAL | Status: DC
  Administered 2020-08-11 – 2020-08-13 (×3): 50 mg via ORAL
  Filled 2020-08-11 (×3): qty 1

## 2020-08-11 MED ORDER — METOCLOPRAMIDE HCL 10 MG/10ML PO SOLN
5.0000 mg | Freq: Three times a day (TID) | ORAL | Status: DC
  Administered 2020-08-11 – 2020-08-14 (×11): 5 mg via ORAL
  Filled 2020-08-11 (×13): qty 10

## 2020-08-11 MED ORDER — INSULIN GLARGINE 100 UNIT/ML ~~LOC~~ SOLN
15.0000 [IU] | Freq: Two times a day (BID) | SUBCUTANEOUS | Status: DC
  Administered 2020-08-11 – 2020-08-15 (×9): 15 [IU] via SUBCUTANEOUS
  Filled 2020-08-11 (×13): qty 0.15

## 2020-08-11 MED ORDER — PREDNISONE 20 MG PO TABS
40.0000 mg | ORAL_TABLET | Freq: Every day | ORAL | Status: DC
  Administered 2020-08-11 – 2020-08-14 (×4): 40 mg via ORAL
  Filled 2020-08-11 (×4): qty 2

## 2020-08-11 MED ORDER — METOPROLOL SUCCINATE ER 50 MG PO TB24
50.0000 mg | ORAL_TABLET | Freq: Every day | ORAL | Status: DC
  Administered 2020-08-11 – 2020-08-13 (×3): 50 mg via ORAL
  Filled 2020-08-11 (×3): qty 1

## 2020-08-11 MED ORDER — APIXABAN 5 MG PO TABS
5.0000 mg | ORAL_TABLET | Freq: Two times a day (BID) | ORAL | Status: DC
  Administered 2020-08-11 – 2020-08-14 (×7): 5 mg via ORAL
  Filled 2020-08-11 (×7): qty 1

## 2020-08-11 MED ORDER — LACTATED RINGERS IV SOLN: INTRAVENOUS | Status: AC

## 2020-08-11 MED ORDER — ONDANSETRON HCL 4 MG PO TABS
4.0000 mg | ORAL_TABLET | Freq: Four times a day (QID) | ORAL | Status: DC | PRN
  Administered 2020-08-12: 4 mg via ORAL
  Filled 2020-08-11: qty 1

## 2020-08-11 MED ORDER — ACETAMINOPHEN 325 MG PO TABS
650.0000 mg | ORAL_TABLET | Freq: Four times a day (QID) | ORAL | Status: DC | PRN
  Administered 2020-08-11: 650 mg via ORAL
  Filled 2020-08-11 (×2): qty 2

## 2020-08-11 MED ORDER — FERROUS SULFATE 325 (65 FE) MG PO TABS
325.0000 mg | ORAL_TABLET | Freq: Every day | ORAL | Status: DC
  Administered 2020-08-11 – 2020-08-14 (×4): 325 mg via ORAL
  Filled 2020-08-11 (×4): qty 1

## 2020-08-11 MED ORDER — ATORVASTATIN CALCIUM 40 MG PO TABS
40.0000 mg | ORAL_TABLET | Freq: Every day | ORAL | Status: DC
  Administered 2020-08-11 – 2020-08-13 (×3): 40 mg via ORAL
  Filled 2020-08-11 (×3): qty 1

## 2020-08-11 MED ORDER — INSULIN ASPART 100 UNIT/ML ~~LOC~~ SOLN
0.0000 [IU] | Freq: Three times a day (TID) | SUBCUTANEOUS | Status: DC
  Administered 2020-08-11 (×4): 3 [IU] via SUBCUTANEOUS
  Administered 2020-08-12: 2 [IU] via SUBCUTANEOUS
  Administered 2020-08-12 – 2020-08-13 (×5): 3 [IU] via SUBCUTANEOUS
  Administered 2020-08-13 (×2): 2 [IU] via SUBCUTANEOUS
  Administered 2020-08-14 (×2): 3 [IU] via SUBCUTANEOUS
  Filled 2020-08-11: qty 0.15

## 2020-08-11 NOTE — Progress Notes (Signed)
[name], sister in-law updated about patient condition.

## 2020-08-11 NOTE — Progress Notes (Signed)
Pt MEWS is yellow, BP elevated, MD notified and placed new orders for Clonidine. Will administer medication and monitor patient's progress. Vital signs are increased to q2x4.

## 2020-08-11 NOTE — Plan of Care (Signed)
  Problem: RH SAFETY Goal: RH STG DECREASED RISK OF FALL WITH ASSISTANCE Description: STG Decreased Risk of Fall With Assistance. Outcome: Progressing   Problem: RH KNOWLEDGE DEFICIT Goal: RH STG INCREASE KNOWLEDGE OF DIABETES Outcome: Progressing   Problem: RH BOWEL ELIMINATION Goal: RH STG MANAGE BOWEL WITH ASSISTANCE Description: STG Manage Bowel with Assistance. Outcome: Progressing   Problem: RH SKIN INTEGRITY Goal: RH STG SKIN FREE OF INFECTION/BREAKDOWN Outcome: Progressing

## 2020-08-11 NOTE — Progress Notes (Signed)
  Echocardiogram 2D Echocardiogram has been performed.  [name] 08/11/2020, 1:52 PM

## 2020-08-11 NOTE — Progress Notes (Signed)
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

## 2020-08-11 NOTE — H&P (Signed)
History and Physical    [name] QIO:[id number] DOB: February 21, 1961 DOA: 08/10/2020  PCP: Charlott Rakes, MD  Patient coming from: Home   Chief Complaint:  Chief Complaint  Patient presents with  . Leg Swelling  . Medical Clearance    rehab placement      HPI:    59 year old male with past medical history of polyarticular gout, insulin-dependent diabetes mellitus type 2, iron deficiency anemia, hypertension and recent diagnosis of left upper extremity DVT (06/2020) presenting with generalized malaise and weakness.  Of note, patient was recently hospitalized from 8/11-8/24 at [facility] for acute kidney injury with a hospital stay that was complicated by the identification of a left upper extremity DVT as well as identification of polyarticular gout and iron deficiency anemia. Patient was discharged to a skilled nursing facility.  Since patient has been home, patient explains that he has been developing generalized worsening weakness. This weakness was initially mild in intensity but has progressively become more severe. This is associated with poor appetite. Patient states that his symptoms continue to worsen and approximately 3 days ago he began to develop difficulty with urination. He states that it has become progressively more difficult to urinate. He does not endorse associated dysuria or low back pain however. Patient also states that his urine has left "really dark" for the past 3 days.  It is worth noting that the patient presented to [facility] long emergency department on 9/21 with left upper extremity pain. At that time it was identified that patient was not taking his Eliquis since discharge from his skilled nursing facility. Patient was placed back on Eliquis after a repeat left upper extremity ultrasound revealed no propagation or enlargement of the DVT.  Due to patient's progressively worsening symptoms he eventually presented to [facility] emergency department for  evaluation. Upon evaluation in the emergency department patient was found to be exhibiting multiple SIRS criteria concerning for infectious process. Patient was found to be febrile on arrival of 102.8 F. CT angiogram of the chest revealed no evidence of pulmonary embolism or pneumonia. Blood cultures were obtained. Urinalysis was ordered and is pending. Due to patient's persisting generalized weakness and multiple SIRS criteria without obvious source the hospitalist group has been called to assess the patient for admission to the hospital.  Review of Systems:   Review of Systems  Constitutional: Positive for fever and malaise/fatigue.  Respiratory: Positive for cough.   Gastrointestinal: Positive for nausea.  Musculoskeletal: Positive for myalgias.  Neurological: Positive for weakness.  All other systems reviewed and are negative.   Past Medical History:  Diagnosis Date  . Gouty bursitis, elbow  10/18/2015  . Hyperlipidemia   . Hypertension   . Knee pain, chronic   . Type II diabetes mellitus ([place])     Past Surgical History:  Procedure Laterality Date  . COLONOSCOPY    . KNEE ARTHROSCOPY Bilateral 12/29/2015   Procedure: ARTHROSCOPY KNEE BILATERAL WITH  PARTIAL MEDIAL AND LATERAL MENISCECTOMY AND FOUR COMPARTMENT SYNOVECTOMY , CHONDRALPLASTIES, PATELLA-FEMORAL CHONDRALPLASTIES BILATERALLY;  Surgeon: Dorna Leitz, MD;  Location: [facility];  Service: Orthopedics;  Laterality: Bilateral;     reports that he has never smoked. He has never used smokeless tobacco. He reports that he does not drink alcohol and does not use drugs.  Allergies  Allergen Reactions  . Hctz [Hydrochlorothiazide]     Hx of gout; uric acid 14.1 on 03/13/20 Acute gout L elbow 8/11-8/24/21    Family History  Problem Relation Age of  Onset  . Diabetes Mother   . Diabetes Father      Prior to Admission medications   Medication Sig Start Date End Date Taking? Authorizing Provider  Accu-Chek FastClix Lancets MISC  1 each by Does not apply route 3 (three) times daily. as directed 02/09/20   Charlott Rakes, MD  Alcohol Swabs (B-D SINGLE USE SWABS REGULAR) PADS Please use as instructed before checking blood sugar and before injecting insulin. 02/09/20   Charlott Rakes, MD  allopurinol (ZYLOPRIM) 300 MG tablet Take 1 tablet (300 mg total) by mouth daily. 08/01/20   Medina-Vargas, Monina C, NP  apixaban (ELIQUIS) 5 MG TABS tablet Take 1 tablet (5 mg total) by mouth 2 (two) times daily. 08/01/20   Medina-Vargas, Monina C, NP  APIXABAN (ELIQUIS) VTE STARTER PACK (10MG AND 5MG) Take as directed on package: start with two-38m tablets twice daily for 7 days. On day 8, switch to one-513mtablet twice daily. 08/08/20   TrWyvonnia DuskyMD  atorvastatin (LIPITOR) 40 MG tablet Take 1 tablet (40 mg total) by mouth daily at 6 PM. 08/01/20   Medina-Vargas, Monina C, NP  Blood Glucose Calibration (ACCU-CHEK AVIVA) SOLN Use to calibrate your glucometer when needed. Patient not taking: Reported on 07/11/2020 02/09/20   NeCharlott RakesMD  Blood Glucose Monitoring Suppl (ACCU-CHEK AVIVA PLUS) w/Device KIT USE AS INSTRUCTED THREE TIMES DAILY. E11.69 Patient not taking: Reported on 07/11/2020 02/09/20   NeCharlott RakesMD  cyclobenzaprine (FLEXERIL) 10 MG tablet Take 1 tablet (10 mg total) by mouth 3 (three) times daily as needed. For Muscle Pain 08/01/20   Medina-Vargas, Monina C, NP  diclofenac Sodium (VOLTAREN) 1 % GEL Apply 2 g topically 4 (four) times daily. 08/01/20   Medina-Vargas, Monina C, NP  DROPLET PEN NEEDLES 32G X 4 MM MISC USE AS DIRECTED DAILY TO ADMINISTER VICTOZA Patient not taking: Reported on 07/11/2020 05/25/20   NeCharlott RakesMD  ferrous sulfate 325 (65 FE) MG tablet Take 1 tablet (325 mg total) by mouth daily with breakfast. For Iron Deficiency Anemia 08/01/20   Medina-Vargas, Monina C, NP  glucose blood (ACCU-CHEK AVIVA PLUS) test strip USE AS INSTRUCTED THREE TIMES DAILY. E11.69 Patient not taking: Reported on  07/11/2020 02/09/20   NeCharlott RakesMD  Insulin Pen Needle (PEN NEEDLES) 30G X 8 MM MISC Use as directed Patient not taking: Reported on 07/11/2020 07/11/20   WoAllie BossierMD  Magnesium Oxide 200 MG TABS Take 2 tablets (400 mg total) by mouth daily. Take 2 tabs to = 400 mg 08/01/20   Medina-Vargas, Monina C, NP  metoCLOPramide (REGLAN) 5 MG/5ML solution Take 5 mLs (5 mg total) by mouth 3 (three) times daily before meals. 08/01/20   Medina-Vargas, Monina C, NP  metoprolol succinate (TOPROL-XL) 50 MG 24 hr tablet Take 1 tablet (50 mg total) by mouth daily. Take with or immediately following a meal. 08/01/20   Medina-Vargas, Monina C, NP  NON FORMULARY Diet- Renal / CCD Regular Diet 07/12/20   [provider]  pantoprazole (PROTONIX) 40 MG tablet Take 1 tablet (40 mg total) by mouth at bedtime. 08/01/20   Medina-Vargas, Monina C, NP  traZODone (DESYREL) 50 MG tablet Take 1 tablet (50 mg total) by mouth at bedtime. 08/01/20   Medina-Vargas, MoSenaida LangeNP    Physical Exam: Vitals:   08/10/20 2200 08/10/20 2215 08/10/20 2230 08/11/20 0000  BP: (!) 159/94 (!) 154/102 (!) 163/100 (!) 153/92  Pulse: (!) 101 (!) 103 (!) 103 (!) 102  Resp: (!) 27 (!) 31 (!) 30 20  Temp:      TempSrc:      SpO2: 97% 98% 97% 97%    Constitutional: Lethargic but arousable, oriented x3. No associated distress. Patient is obese. Skin: no rashes, no lesions, notable poor skin turgor. Notable hyperkeratosis of the skin, diffuse with significant involvement of the posterior bilateral lower extremities eyes: Pupils are equally reactive to light. Notable increased conjunctival pallor without scleral icterus.  ENMT: Dry mucous membranes noted.  Posterior pharynx clear of any exudate or lesions.   Neck: normal, supple, no masses, no thyromegaly.  No evidence of jugular venous distension.   Respiratory: clear to auscultation bilaterally, no wheezing, no crackles. Normal respiratory effort. No accessory muscle use.   Cardiovascular: Tachycardic rate with regular rhythm. No murmurs / rubs / gallops. Significant left upper extremity edema noted. 2+ pedal pulses. No carotid bruits.  Chest:   Nontender without crepitus or deformity.   Back:   Nontender without crepitus or deformity. Abdomen: Abdomen is protuberant but soft and nontender.  No evidence of intra-abdominal masses.  Positive bowel sounds noted in all quadrants.   Musculoskeletal: Notable pain with both passive and active range of motion of the right knee. No significant associated effusion however. Some notable warmth of the diffuse large joints including the knees and elbows. Notable pain with palpation of the left upper extremity. No joint deformity upper and lower extremities. Good ROM, no contractures. Normal muscle tone.  Neurologic: CN 2-12 grossly intact. Sensation intact.  Patient moving all 4 extremities spontaneously.  Patient is following all commands.  Patient is responsive to verbal stimuli.   Psychiatric: Patient exhibits normal mood with flat affect. Patient seems to possess insight as to their current situation.     Labs on Admission: I have personally reviewed following labs and imaging studies -   CBC: Recent Labs  Lab 08/08/20 1010 08/10/20 2008  WBC 13.6* 10.8*  NEUTROABS 11.5* 9.2*  HGB 8.3* 8.9*  HCT 26.6* 28.3*  MCV 79.6* 76.9*  PLT 201 735   Basic Metabolic Panel: Recent Labs  Lab 08/08/20 1010 08/10/20 2008  NA 135 136  K 4.1 3.6  CL 100 102  CO2 22 20*  GLUCOSE 193* 189*  BUN 19 15  CREATININE 1.21 1.09  CALCIUM 8.3* 8.0*   GFR: Estimated Creatinine Clearance: 81.2 mL/min (by C-G formula based on SCr of 1.09 mg/dL). Liver Function Tests: Recent Labs  Lab 08/10/20 2008  AST 48*  ALT 36  ALKPHOS 103  BILITOT 1.6*  PROT 6.9  ALBUMIN 2.7*   No results for input(s): LIPASE, AMYLASE in the last 168 hours. No results for input(s): AMMONIA in the last 168 hours. Coagulation Profile: Recent Labs   Lab 08/10/20 2008  INR 1.5*   Cardiac Enzymes: Recent Labs  Lab 08/10/20 2008  CKTOTAL 101   BNP (last 3 results) No results for input(s): PROBNP in the last 8760 hours. HbA1C: No results for input(s): HGBA1C in the last 72 hours. CBG: No results for input(s): GLUCAP in the last 168 hours. Lipid Profile: No results for input(s): CHOL, HDL, LDLCALC, TRIG, CHOLHDL, LDLDIRECT in the last 72 hours. Thyroid Function Tests: No results for input(s): TSH, T4TOTAL, FREET4, T3FREE, THYROIDAB in the last 72 hours. Anemia Panel: No results for input(s): VITAMINB12, FOLATE, FERRITIN, TIBC, IRON, RETICCTPCT in the last 72 hours. Urine analysis:    Component Value Date/Time   COLORURINE YELLOW 06/28/2020 2200   APPEARANCEUR HAZY (A) 06/28/2020 2200  LABSPEC 1.013 06/28/2020 2200   PHURINE 5.0 06/28/2020 2200   GLUCOSEU NEGATIVE 06/28/2020 2200   HGBUR LARGE (A) 06/28/2020 2200   BILIRUBINUR NEGATIVE 06/28/2020 2200   KETONESUR NEGATIVE 06/28/2020 2200   PROTEINUR NEGATIVE 06/28/2020 2200   UROBILINOGEN 0.2 06/14/2011 2309   NITRITE NEGATIVE 06/28/2020 2200   LEUKOCYTESUR NEGATIVE 06/28/2020 2200    Radiological Exams on Admission - Personally Reviewed: CT Angio Chest PE W and/or Wo Contrast  Result Date: 08/10/2020 CLINICAL DATA:  PE suspected, high prob known dvt in the arm and has elevated trop today EXAM: CT ANGIOGRAPHY CHEST WITH CONTRAST TECHNIQUE: Multidetector CT imaging of the chest was performed using the standard protocol during bolus administration of intravenous contrast. Multiplanar CT image reconstructions and MIPs were obtained to evaluate the vascular anatomy. CONTRAST:  169m OMNIPAQUE IOHEXOL 350 MG/ML SOLN COMPARISON:  Chest CTA 2 days ago 08/08/2020 FINDINGS: Cardiovascular: Examination is again moderately motion degraded. There are no filling defects within the central pulmonary arteries to the lobar level to suggest pulmonary embolus. Segmental and subsegmental  evaluation is limited, particularly in the lower lobes. No aortic dissection or aneurysm. Borderline cardiomegaly. No pericardial effusion. Mediastinum/Nodes: No enlarged mediastinal or hilar lymph nodes. No suspicious thyroid nodule. No esophageal wall thickening. Tiny hiatal hernia. Lungs/Pleura: Mild dependent atelectasis in the right greater than left lung. Motion degrades detailed assessment. No confluent airspace disease. No findings of pulmonary edema. No pleural fluid. Trachea and central bronchi are patent. Upper Abdomen: Partially included gaseous distension of transverse colon. No colonic wall thickening. Musculoskeletal: There are no acute or suspicious osseous abnormalities. Again seen diffuse degenerative change in the spine. Review of the MIP images confirms the above findings. IMPRESSION: 1. Motion limited examination. No evidence of pulmonary embolus to the lobar level. 2. Mild dependent atelectasis. Electronically Signed   By: MKeith RakeM.D.   On: 08/10/2020 21:50   DG Chest Port 1 View  Result Date: 08/10/2020 CLINICAL DATA:  Questionable sepsis evaluate for cause EXAM: PORTABLE CHEST 1 VIEW COMPARISON:  08/08/2020 FINDINGS: Trachea midline. Cardiomediastinal contours and hilar structures are normal. Lungs are clear. No lobar consolidation. No sign of pleural effusion. Limited assessment of skeletal structures without acute process. IMPRESSION: No acute cardiopulmonary disease. Electronically Signed   By: GZetta BillsM.D.   On: 08/10/2020 20:37    EKG: Personally reviewed.  Rhythm is sinus tachycardia with heart rate of 108 bpm. Poor R wave progression in precordial leads.   no dynamic ST segment changes appreciated.  Assessment/Plan Principal Problem:   SIRS (systemic inflammatory response syndrome) (HCC)   Patient presenting with several day history of progressive generalized weakness and malaise  On arrival, patient found to have multiple SIRS criteria including  tachycardia, tachypnea and leukocytosis  While work-up so far has not identified a source of infection, patient's difficulty with urination over the past several days including here in the emergency department leads me to believe the patient may be suffering from a complicated UTI.  Due to patient's inability to urinate, will straight cath patient for urine and obtain urinalysis and urine culture. If urine culture is indeed suggestive of urinary tract infection will initiate intravenous ceftriaxone.  Hydrating patient with intravenous isotonic fluids  Additionally obtaining blood cultures  CT imaging of the chest reveals no evidence of pneumonia  Active Problems:   Elevated troponin   Likely secondary to supply demand mismatch in the setting of infection  Patient is currently chest pain-free  Monitoring patient on telemetry  Cycling cardiac enzymes  If cardiac enzymes continue to rise we will consider cardiology consultation tomorrow.  Echocardiogram ordered for the morning.    Generalized weakness   Please see assessment and plan above    Essential hypertension   Continue home regimen antihypertensive therapy    Gout   Other than some mild right knee tenderness I do not believe the patient is suffering from acute gout at this time. Will continue home regimen of daily allopurinol.    Iron deficiency anemia   Essentially stable compared to prior recent values  No clinical evidence of bleeding at this time.  Iron deficiency was identified during recent hospitalization in August. At that time gastroenterology recommended outpatient endoscopic evaluation.  Will obtain repeat iron panel  Continue home regimen of daily oral iron supplementation  Monitor hemoglobin and hematocrit with serial CBCs.    Uncontrolled type 2 diabetes mellitus with hyperglycemia, without long-term current use of insulin ([place])   . Patient been placed on Accu-Cheks before every meal and  nightly with sliding scale insulin . Placing patient back on Lantus twice daily based on medication reconciliation from last discharge summary . Diabetic Diet  Acute deep venous thrombosis of brachial vein of the left upper extremity   Patient presented to [facility] emergency department on 9/21 with severe left upper extremity pain due to noncompliance with Eliquis treatment since discharge from skilled nursing facility several weeks ago.  Patient has since been placed back on Eliquis and has been restarted on loading doses starting 9/21.  Continuing 10 mg twice daily to complete 7 days followed by 5 mg twice daily.  Patient still exhibiting some left upper extremity edema and pain although this is improved compared to the other day.  Code Status:  Full code Family Communication: deferred   Status is: Observation  The patient remains OBS appropriate and will d/c before 2 midnights.  Dispo: The patient is from: Home              Anticipated d/c is to: Home              Anticipated d/c date is: 2 days              Patient currently is not medically stable to d/c.        Vernelle Emerald MD [facility] Pager [phone]  If 7PM-7AM, please contact night-coverage [email or web address] Use universal [facility] password for that web site. If you do not have the password, please call the hospital operator.  08/11/2020, 1:15 AM

## 2020-08-12 DIAGNOSIS — R140 Abdominal distension (gaseous): Secondary | ICD-10-CM | POA: Diagnosis not present

## 2020-08-12 DIAGNOSIS — Z79899 Other long term (current) drug therapy: Secondary | ICD-10-CM | POA: Diagnosis not present

## 2020-08-12 DIAGNOSIS — K6389 Other specified diseases of intestine: Secondary | ICD-10-CM | POA: Diagnosis not present

## 2020-08-12 DIAGNOSIS — M170 Bilateral primary osteoarthritis of knee: Secondary | ICD-10-CM | POA: Diagnosis present

## 2020-08-12 DIAGNOSIS — Y929 Unspecified place or not applicable: Secondary | ICD-10-CM | POA: Diagnosis not present

## 2020-08-12 DIAGNOSIS — E872 Acidosis: Secondary | ICD-10-CM | POA: Diagnosis present

## 2020-08-12 DIAGNOSIS — E440 Moderate protein-calorie malnutrition: Secondary | ICD-10-CM | POA: Diagnosis not present

## 2020-08-12 DIAGNOSIS — Z833 Family history of diabetes mellitus: Secondary | ICD-10-CM | POA: Diagnosis not present

## 2020-08-12 DIAGNOSIS — M6281 Muscle weakness (generalized): Secondary | ICD-10-CM | POA: Diagnosis not present

## 2020-08-12 DIAGNOSIS — Z978 Presence of other specified devices: Secondary | ICD-10-CM | POA: Diagnosis not present

## 2020-08-12 DIAGNOSIS — M1000 Idiopathic gout, unspecified site: Secondary | ICD-10-CM | POA: Diagnosis not present

## 2020-08-12 DIAGNOSIS — E119 Type 2 diabetes mellitus without complications: Secondary | ICD-10-CM | POA: Diagnosis not present

## 2020-08-12 DIAGNOSIS — Z91128 Patient's intentional underdosing of medication regimen for other reason: Secondary | ICD-10-CM | POA: Diagnosis not present

## 2020-08-12 DIAGNOSIS — K567 Ileus, unspecified: Secondary | ICD-10-CM | POA: Diagnosis not present

## 2020-08-12 DIAGNOSIS — Z4682 Encounter for fitting and adjustment of non-vascular catheter: Secondary | ICD-10-CM | POA: Diagnosis not present

## 2020-08-12 DIAGNOSIS — R509 Fever, unspecified: Secondary | ICD-10-CM | POA: Diagnosis not present

## 2020-08-12 DIAGNOSIS — E1140 Type 2 diabetes mellitus with diabetic neuropathy, unspecified: Secondary | ICD-10-CM | POA: Diagnosis not present

## 2020-08-12 DIAGNOSIS — R6510 Systemic inflammatory response syndrome (SIRS) of non-infectious origin without acute organ dysfunction: Secondary | ICD-10-CM | POA: Diagnosis not present

## 2020-08-12 DIAGNOSIS — M2550 Pain in unspecified joint: Secondary | ICD-10-CM | POA: Diagnosis not present

## 2020-08-12 DIAGNOSIS — E1165 Type 2 diabetes mellitus with hyperglycemia: Secondary | ICD-10-CM | POA: Diagnosis not present

## 2020-08-12 DIAGNOSIS — R109 Unspecified abdominal pain: Secondary | ICD-10-CM | POA: Diagnosis not present

## 2020-08-12 DIAGNOSIS — D509 Iron deficiency anemia, unspecified: Secondary | ICD-10-CM | POA: Diagnosis present

## 2020-08-12 DIAGNOSIS — R778 Other specified abnormalities of plasma proteins: Secondary | ICD-10-CM | POA: Diagnosis not present

## 2020-08-12 DIAGNOSIS — R933 Abnormal findings on diagnostic imaging of other parts of digestive tract: Secondary | ICD-10-CM | POA: Diagnosis not present

## 2020-08-12 DIAGNOSIS — M109 Gout, unspecified: Secondary | ICD-10-CM | POA: Diagnosis present

## 2020-08-12 DIAGNOSIS — K3189 Other diseases of stomach and duodenum: Secondary | ICD-10-CM | POA: Diagnosis not present

## 2020-08-12 DIAGNOSIS — I129 Hypertensive chronic kidney disease with stage 1 through stage 4 chronic kidney disease, or unspecified chronic kidney disease: Secondary | ICD-10-CM | POA: Diagnosis present

## 2020-08-12 DIAGNOSIS — I248 Other forms of acute ischemic heart disease: Secondary | ICD-10-CM | POA: Diagnosis not present

## 2020-08-12 DIAGNOSIS — Z20822 Contact with and (suspected) exposure to covid-19: Secondary | ICD-10-CM | POA: Diagnosis not present

## 2020-08-12 DIAGNOSIS — E876 Hypokalemia: Secondary | ICD-10-CM | POA: Diagnosis not present

## 2020-08-12 DIAGNOSIS — E785 Hyperlipidemia, unspecified: Secondary | ICD-10-CM | POA: Diagnosis present

## 2020-08-12 DIAGNOSIS — R561 Post traumatic seizures: Secondary | ICD-10-CM | POA: Diagnosis not present

## 2020-08-12 DIAGNOSIS — M10041 Idiopathic gout, right hand: Secondary | ICD-10-CM | POA: Diagnosis not present

## 2020-08-12 DIAGNOSIS — I10 Essential (primary) hypertension: Secondary | ICD-10-CM | POA: Diagnosis not present

## 2020-08-12 DIAGNOSIS — Z7901 Long term (current) use of anticoagulants: Secondary | ICD-10-CM | POA: Diagnosis not present

## 2020-08-12 DIAGNOSIS — M47816 Spondylosis without myelopathy or radiculopathy, lumbar region: Secondary | ICD-10-CM | POA: Diagnosis not present

## 2020-08-12 DIAGNOSIS — R5381 Other malaise: Secondary | ICD-10-CM | POA: Diagnosis not present

## 2020-08-12 DIAGNOSIS — B965 Pseudomonas (aeruginosa) (mallei) (pseudomallei) as the cause of diseases classified elsewhere: Secondary | ICD-10-CM | POA: Diagnosis not present

## 2020-08-12 DIAGNOSIS — I82622 Acute embolism and thrombosis of deep veins of left upper extremity: Secondary | ICD-10-CM | POA: Diagnosis not present

## 2020-08-12 DIAGNOSIS — R195 Other fecal abnormalities: Secondary | ICD-10-CM | POA: Diagnosis not present

## 2020-08-12 DIAGNOSIS — N182 Chronic kidney disease, stage 2 (mild): Secondary | ICD-10-CM | POA: Diagnosis present

## 2020-08-12 DIAGNOSIS — E1122 Type 2 diabetes mellitus with diabetic chronic kidney disease: Secondary | ICD-10-CM | POA: Diagnosis present

## 2020-08-12 DIAGNOSIS — T45516A Underdosing of anticoagulants, initial encounter: Secondary | ICD-10-CM | POA: Diagnosis present

## 2020-08-12 DIAGNOSIS — K5981 Ogilvie syndrome: Secondary | ICD-10-CM | POA: Diagnosis not present

## 2020-08-12 DIAGNOSIS — K760 Fatty (change of) liver, not elsewhere classified: Secondary | ICD-10-CM | POA: Diagnosis not present

## 2020-08-12 DIAGNOSIS — K560 Paralytic ileus: Secondary | ICD-10-CM | POA: Diagnosis not present

## 2020-08-12 DIAGNOSIS — N390 Urinary tract infection, site not specified: Secondary | ICD-10-CM | POA: Diagnosis not present

## 2020-08-12 DIAGNOSIS — K5939 Other megacolon: Secondary | ICD-10-CM | POA: Diagnosis not present

## 2020-08-12 DIAGNOSIS — E1142 Type 2 diabetes mellitus with diabetic polyneuropathy: Secondary | ICD-10-CM | POA: Diagnosis present

## 2020-08-12 DIAGNOSIS — A4151 Sepsis due to Escherichia coli [E. coli]: Secondary | ICD-10-CM | POA: Diagnosis present

## 2020-08-12 DIAGNOSIS — R000 Tachycardia, unspecified: Secondary | ICD-10-CM | POA: Diagnosis present

## 2020-08-12 DIAGNOSIS — Z7401 Bed confinement status: Secondary | ICD-10-CM | POA: Diagnosis not present

## 2020-08-12 DIAGNOSIS — K56609 Unspecified intestinal obstruction, unspecified as to partial versus complete obstruction: Secondary | ICD-10-CM | POA: Diagnosis not present

## 2020-08-12 DIAGNOSIS — Z794 Long term (current) use of insulin: Secondary | ICD-10-CM | POA: Diagnosis not present

## 2020-08-12 DIAGNOSIS — Z6838 Body mass index (BMI) 38.0-38.9, adult: Secondary | ICD-10-CM | POA: Diagnosis not present

## 2020-08-12 DIAGNOSIS — K766 Portal hypertension: Secondary | ICD-10-CM | POA: Diagnosis present

## 2020-08-12 LAB — CBC WITH DIFFERENTIAL/PLATELET
Abs Immature Granulocytes: 0.04 10*3/uL (ref 0.00–0.07)
Basophils Absolute: 0 10*3/uL (ref 0.0–0.1)
Basophils Relative: 0 %
Eosinophils Absolute: 0 10*3/uL (ref 0.0–0.5)
Eosinophils Relative: 0 %
HCT: 24.3 % — ABNORMAL LOW (ref 39.0–52.0)
Hemoglobin: 7.7 g/dL — ABNORMAL LOW (ref 13.0–17.0)
Immature Granulocytes: 0 %
Lymphocytes Relative: 8 %
Lymphs Abs: 0.8 10*3/uL (ref 0.7–4.0)
MCH: 24.7 pg — ABNORMAL LOW (ref 26.0–34.0)
MCHC: 31.7 g/dL (ref 30.0–36.0)
MCV: 77.9 fL — ABNORMAL LOW (ref 80.0–100.0)
Monocytes Absolute: 0.7 10*3/uL (ref 0.1–1.0)
Monocytes Relative: 7 %
Neutro Abs: 8.9 10*3/uL — ABNORMAL HIGH (ref 1.7–7.7)
Neutrophils Relative %: 85 %
Platelets: 334 10*3/uL (ref 150–400)
RBC: 3.12 MIL/uL — ABNORMAL LOW (ref 4.22–5.81)
RDW: 20 % — ABNORMAL HIGH (ref 11.5–15.5)
WBC: 10.5 10*3/uL (ref 4.0–10.5)
nRBC: 0 % (ref 0.0–0.2)

## 2020-08-12 LAB — COMPREHENSIVE METABOLIC PANEL
ALT: 50 U/L — ABNORMAL HIGH (ref 0–44)
AST: 72 U/L — ABNORMAL HIGH (ref 15–41)
Albumin: 2.4 g/dL — ABNORMAL LOW (ref 3.5–5.0)
Alkaline Phosphatase: 92 U/L (ref 38–126)
Anion gap: 14 (ref 5–15)
BUN: 18 mg/dL (ref 6–20)
CO2: 21 mmol/L — ABNORMAL LOW (ref 22–32)
Calcium: 8.4 mg/dL — ABNORMAL LOW (ref 8.9–10.3)
Chloride: 103 mmol/L (ref 98–111)
Creatinine, Ser: 0.97 mg/dL (ref 0.61–1.24)
GFR calc Af Amer: >60 mL/min (ref >60)
GFR calc non Af Amer: >60 mL/min (ref >60)
Glucose, Bld: 160 mg/dL — ABNORMAL HIGH (ref 70–99)
Potassium: 2.8 mmol/L — ABNORMAL LOW (ref 3.5–5.1)
Sodium: 138 mmol/L (ref 135–145)
Total Bilirubin: 0.9 mg/dL (ref 0.3–1.2)
Total Protein: 6.5 g/dL (ref 6.5–8.1)

## 2020-08-12 LAB — GLUCOSE, CAPILLARY
Glucose-Capillary: 151 mg/dL — ABNORMAL HIGH (ref 70–99)
Glucose-Capillary: 121 mg/dL — ABNORMAL HIGH (ref 70–99)
Glucose-Capillary: 176 mg/dL — ABNORMAL HIGH (ref 70–99)
Glucose-Capillary: 167 mg/dL — ABNORMAL HIGH (ref 70–99)

## 2020-08-12 LAB — MAGNESIUM: Magnesium: 1.7 mg/dL (ref 1.7–2.4)

## 2020-08-12 MED ORDER — SORBITOL 70 % SOLN
15.0000 mL | Freq: Every day | Status: DC
  Administered 2020-08-12 – 2020-08-14 (×3): 15 mL via ORAL
  Filled 2020-08-12 (×4): qty 30

## 2020-08-12 MED ORDER — SODIUM CHLORIDE 0.9 % IV SOLN
510.0000 mg | Freq: Once | INTRAVENOUS | Status: AC
  Administered 2020-08-12: 510 mg via INTRAVENOUS
  Filled 2020-08-12: qty 510

## 2020-08-12 MED ORDER — CEPHALEXIN 500 MG PO CAPS
500.0000 mg | ORAL_CAPSULE | Freq: Two times a day (BID) | ORAL | Status: DC
  Administered 2020-08-12 – 2020-08-14 (×5): 500 mg via ORAL
  Filled 2020-08-12 (×5): qty 1

## 2020-08-12 MED ORDER — POTASSIUM CHLORIDE CRYS ER 20 MEQ PO TBCR
40.0000 meq | EXTENDED_RELEASE_TABLET | Freq: Two times a day (BID) | ORAL | Status: DC
  Administered 2020-08-12 (×2): 40 meq via ORAL
  Filled 2020-08-12 (×2): qty 2

## 2020-08-12 NOTE — Evaluation (Signed)
Occupational Therapy Evaluation Patient Details Name: [name] MRN: [id number] DOB: Apr 04, 1961 Today's Date: 08/12/2020    History of Present Illness 59 yo male admitted to [facility] on 9/24 with weakness, joint pain, difficulty urinating. Pt meeting SIRS criteria, possibly secondary to urinary infection. PMHx significant for chronic gouty arthritis of multiple joints, CKD II, IDDM, HTN, HLD, and chronic anemia. Admitted to [facility] 8/11-8/24 for AKI, LUE DVT, and joint pain, d/c SNF.   Clinical Impression   [name] is a 59 year old man who presents with above medical history with decreased ROM and strength of all extremities, generalized weakness, complaints of pain, and poor activity tolerance resulting in decreased ability to perform self care tasks and mobility. On evaluation patient required + 2 assistance to transfer to side of bed, unable to don socks, limited hand use secondary to pain and edema, and max assist to return to bed. Patient unable to tolerate more than sitting side of bed and then reported nausea and began spitting in basin. Patient's baseline cognition questionable - patient's mentation tangential and therapist was unable to assess his current wants and he became agitated with requests to go back to bed. Patient will benefit from skilled OT services to improve deficits and learn compensatory strategies as needed in order to improve functional abilities. Patient unable to care for himself and will need short term rehab at discharge.    Follow Up Recommendations  SNF    Equipment Recommendations  None recommended by OT    Recommendations for Other Services       Precautions / Restrictions Precautions Precautions: Fall Restrictions Weight Bearing Restrictions: No      Mobility Bed Mobility Overal bed mobility: Needs Assistance Bed Mobility: Supine to Sit;Sit to Supine     Supine to sit: Mod assist;+2 for physical assistance;HOB elevated Sit to supine: Max  assist;+2 for physical assistance;HOB elevated   General bed mobility comments: mod-max +2 for supine<>sit for trunk and LE management, scooting up in bed with use of bed pads. Pt with nausea at EOB, pt stating "I need to throw up" but just spit in basin when given.  Transfers                 General transfer comment: Pt declines    Balance Overall balance assessment: Needs assistance Sitting-balance support: Single extremity supported Sitting balance-Leahy Scale: Fair Sitting balance - Comments: sits EOB statically well, unable to don socks when asked       Standing balance comment: NT                           ADL either performed or assessed with clinical judgement   ADL Overall ADL's : Needs assistance/impaired Eating/Feeding: Set up;Bed level Eating/Feeding Details (indicate cue type and reason): Reports able to feed himself some applesauce Grooming: Set up;Bed level   Upper Body Bathing: Maximal assistance;Bed level   Lower Body Bathing: Total assistance;Bed level   Upper Body Dressing : Total assistance;Bed level   Lower Body Dressing: Total assistance;+2 for physical assistance;Bed level       Toileting- Clothing Manipulation and Hygiene: Total assistance;+2 for physical assistance;Bed level               Vision Patient Visual Report: No change from baseline       Perception     Praxis      Pertinent Vitals/Pain Pain Assessment: Faces Faces Pain Scale: Hurts whole lot  Pain Location: hands Pain Descriptors / Indicators: Sore;Sharp Pain Intervention(s): Limited activity within patient's tolerance     Hand Dominance Right   Extremity/Trunk Assessment Upper Extremity Assessment Upper Extremity Assessment: RUE deficits/detail;LUE deficits/detail RUE Deficits / Details: Grossly functional ROM of right upper extremity, required Active assist to raise arm to shoulder level, did not exhibit wrist extension - mainted at neutral, less  than functional ROM in hands due to pain and edeam RUE Coordination: decreased fine motor LUE Deficits / Details: Grossly functional ROM in LUE, > ROM in wrist and fingers on left side but more edema in left hand.   Lower Extremity Assessment Lower Extremity Assessment: Defer to PT evaluation RLE Deficits / Details: active knee flexion and extension contraction observed, but very little AROM performed on R   Cervical / Trunk Assessment Cervical / Trunk Assessment: Normal   Communication Communication Communication: No difficulties   Cognition Arousal/Alertness: Awake/alert Behavior During Therapy: Restless;Flat affect Overall Cognitive Status: No family/caregiver present to determine baseline cognitive functioning                                 General Comments: Pt initially pleasant, but became disgruntled EOB stating impatiently "lay me down, lay me down, lay me down" repeatedly but PT and OT unaware pt was ready to lay down before pt verbalized this. Pt is difficult to follow in conversation, requiring clarification questions from PT/OT.   General Comments  Feet and lower legs with sloughing skin, mild LE edema nonpitting.    Exercises General Exercises - Lower Extremity Quad Sets: AROM;Both;5 reps;Supine (pt performed when PT encouraging exercises throughout the day while acute)   Shoulder Instructions      Home Living Family/patient expects to be discharged to:: Private residence Living Arrangements: Other (Comment);Other relatives Available Help at Discharge: Friend(s);Available PRN/intermittently Type of Home: Apartment Home Access: Level entry     Home Layout: One level     Bathroom Shower/Tub: Teacher, early years/pre: Standard     Home Equipment: Environmental consultant - 2 wheels;Wheelchair - manual;Cane - single point          Prior Functioning/Environment Level of Independence: Needs assistance  Gait / Transfers Assistance Needed: pt reports  using RW for ambulation at home ADL's / Homemaking Assistance Needed: Independent with BADLs including bathing, dressing, and toileting            OT Problem List: Decreased strength;Decreased range of motion;Decreased activity tolerance;Impaired balance (sitting and/or standing);Decreased coordination;Decreased cognition;Decreased safety awareness;Decreased knowledge of use of DME or AE;Pain;Obesity;Impaired UE functional use      OT Treatment/Interventions: Self-care/ADL training;Therapeutic exercise;DME and/or AE instruction;Therapeutic activities;Patient/family education    OT Goals(Current goals can be found in the care plan section) Acute Rehab OT Goals Patient Stated Goal: get stronger OT Goal Formulation: With patient Time For Goal Achievement: 08/26/20 Potential to Achieve Goals: Fair  OT Frequency: Min 2X/week   Barriers to D/C:            Co-evaluation PT/OT/SLP Co-Evaluation/Treatment: Yes Reason for Co-Treatment: For patient/therapist safety;To address functional/ADL transfers PT goals addressed during session: Mobility/safety with mobility OT goals addressed during session: ADL's and self-care;Strengthening/ROM      AM-PAC OT "6 Clicks" Daily Activity     Outcome Measure Help from another person eating meals?: A Little Help from another person taking care of personal grooming?: A Little Help from another person toileting, which includes using toliet, bedpan,  or urinal?: Total Help from another person bathing (including washing, rinsing, drying)?: Total Help from another person to put on and taking off regular upper body clothing?: Total Help from another person to put on and taking off regular lower body clothing?: Total 6 Click Score: 10   End of Session Nurse Communication: Mobility status  Activity Tolerance: Patient limited by pain Patient left: in bed;with call bell/phone within reach;with bed alarm set  OT Visit Diagnosis: Muscle weakness  (generalized) (M62.81);Pain Pain - part of body: Hand                Time: 1455-1515 OT Time Calculation (min): 20 min Charges:  OT General Charges $OT Visit: 1 Visit OT Evaluation $OT Eval Moderate Complexity: 1 Mod  Kylan Veach, OTR/L [place]  Office [phone] Pager: [phone]   Lenward Chancellor 08/12/2020, 4:28 PM

## 2020-08-12 NOTE — Evaluation (Signed)
Physical Therapy Evaluation Patient Details Name: [name] MRN: [id number] DOB: February 11, 1961 Today's Date: 08/12/2020   History of Present Illness  59 yo male admitted to [facility] on 9/24 with weakness, joint pain, difficulty urinating. Pt meeting SIRS criteria, possibly secondary to urinary infection. PMHx significant for chronic gouty arthritis of multiple joints, CKD II, IDDM, HTN, HLD, and chronic anemia. Admitted to [facility] 8/11-8/24 for AKI, LUE DVT, and joint pain, d/c SNF.  Clinical Impression   Pt presents with significant LE weakness, impaired bed mobility, inability to transfer to stand (pt declines even attempting stand), body-wide pain especially in hands R>L, and decreased activity tolerance vs baseline. Pt to benefit from acute PT to address deficits. Pt requiring mod-max +2 for bed mobility tasks this day and unable to transfer to standing, pt stating at home he is ambulatory with RW and able to perform all ADLs.  PT recommending SNF level of care post-acutely. PT to progress mobility as tolerated, and will continue to follow acutely.      Follow Up Recommendations SNF;Supervision/Assistance - 24 hour    Equipment Recommendations  None recommended by PT    Recommendations for Other Services       Precautions / Restrictions Precautions Precautions: Fall Restrictions Weight Bearing Restrictions: No      Mobility  Bed Mobility Overal bed mobility: Needs Assistance Bed Mobility: Supine to Sit;Sit to Supine     Supine to sit: Mod assist;+2 for physical assistance;HOB elevated Sit to supine: Max assist;+2 for physical assistance;HOB elevated   General bed mobility comments: mod-max +2 for supine<>sit for trunk and LE management, scooting up in bed with use of bed pads. Pt with nausea at EOB, pt stating "I need to throw up" but just spit in basin when given.  Transfers                 General transfer comment: Pt declines  Ambulation/Gait                 Stairs            Wheelchair Mobility    Modified Rankin (Stroke Patients Only)       Balance Overall balance assessment: Needs assistance Sitting-balance support: Single extremity supported Sitting balance-Leahy Scale: Fair Sitting balance - Comments: sits EOB statically well, unable to don socks when asked       Standing balance comment: NT                             Pertinent Vitals/Pain Pain Assessment: Faces Faces Pain Scale: Hurts whole lot Pain Location: hands Pain Descriptors / Indicators: Sore;Sharp Pain Intervention(s): Limited activity within patient's tolerance;Monitored during session;Repositioned    Home Living Family/patient expects to be discharged to:: Private residence Living Arrangements: Other (Comment);Other relatives Available Help at Discharge: Friend(s);Available PRN/intermittently Type of Home: Apartment Home Access: Level entry     Home Layout: One level Home Equipment: Walker - 2 wheels;Wheelchair - manual;Cane - single point      Prior Function Level of Independence: Needs assistance   Gait / Transfers Assistance Needed: pt reports using RW for ambulation at home  ADL's / Homemaking Assistance Needed: Independent with BADLs including bathing, dressing, and toileting        Hand Dominance   Dominant Hand: Right    Extremity/Trunk Assessment   Upper Extremity Assessment Upper Extremity Assessment: Defer to OT evaluation    Lower Extremity Assessment Lower Extremity Assessment:  Generalized weakness;RLE deficits/detail RLE Deficits / Details: active knee flexion and extension contraction observed, but very little AROM performed on R       Communication   Communication: No difficulties  Cognition Arousal/Alertness: Awake/alert Behavior During Therapy: Restless;Flat affect (irritable) Overall Cognitive Status: No family/caregiver present to determine baseline cognitive functioning                                  General Comments: Pt initially pleasant, but became disgruntled EOB stating impatiently "lay me down, lay me down, lay me down" repeatedly but PT and OT unaware pt was ready to lay down before pt verbalized this. Pt is difficult to follow in conversation, requiring clarification questions from PT/OT.      General Comments General comments (skin integrity, edema, etc.): Feet and lower legs with sloughing skin, mild LE edema nonpitting.    Exercises General Exercises - Lower Extremity Quad Sets: AROM;Both;5 reps;Supine (pt performed when PT encouraging exercises throughout the day while acute)   Assessment/Plan    PT Assessment Patient needs continued PT services  PT Problem List Decreased strength;Decreased mobility;Decreased safety awareness;Decreased activity tolerance;Decreased balance;Pain;Decreased coordination;Obesity;Decreased skin integrity       PT Treatment Interventions DME instruction;Therapeutic exercise;Gait training;Balance training;Neuromuscular re-education;Functional mobility training;Therapeutic activities;Patient/family education    PT Goals (Current goals can be found in the Care Plan section)  Acute Rehab PT Goals Patient Stated Goal: get stronger PT Goal Formulation: With patient Time For Goal Achievement: 08/26/20 Potential to Achieve Goals: Fair    Frequency Min 2X/week   Barriers to discharge Decreased caregiver support      Co-evaluation PT/OT/SLP Co-Evaluation/Treatment: Yes Reason for Co-Treatment: For patient/therapist safety;To address functional/ADL transfers PT goals addressed during session: Mobility/safety with mobility;Balance;Strengthening/ROM         AM-PAC PT "6 Clicks" Mobility  Outcome Measure Help needed turning from your back to your side while in a flat bed without using bedrails?: A Lot Help needed moving from lying on your back to sitting on the side of a flat bed without using bedrails?: A Lot Help needed  moving to and from a bed to a chair (including a wheelchair)?: Total Help needed standing up from a chair using your arms (e.g., wheelchair or bedside chair)?: Total Help needed to walk in hospital room?: Total Help needed climbing 3-5 steps with a railing? : Total 6 Click Score: 8    End of Session   Activity Tolerance: Patient limited by fatigue;Patient limited by pain Patient left: in bed;with call bell/phone within reach;with bed alarm set Nurse Communication: Mobility status PT Visit Diagnosis: Muscle weakness (generalized) (M62.81);Other abnormalities of gait and mobility (R26.89)    Time: 3762-8315 PT Time Calculation (min) (ACUTE ONLY): 21 min   Charges:   PT Evaluation $PT Eval Low Complexity: 1 Low          Mitzie Marlar E, PT Acute Rehabilitation Services Pager [phone]  Office [phone]    Azilee Pirro D Elonda Husky 08/12/2020, 3:59 PM

## 2020-08-12 NOTE — Progress Notes (Signed)
PROGRESS NOTE    [name]  DXI:[id number] DOB: 1961/03/25 DOA: 08/10/2020 PCP: Charlott Rakes, MD  Brief Narrative:  59 year old black male with known history of IDDM since 2012, HTN, chronic gout Prior scrotal abscess Rx 2017 drained at that time Severe onychomycosis Severe tricompartmental osteoarthritis both knees He is status post bilateral arthroscopies with meniscectomies by Dr. Berenice Primas in 2017 He has had work-up in the past found to be ANA negative RF negative CCP negative and felt to be a combination of his chronic gout and possible osteoarthritis He was recently hospitalized 8/11 through 8/24 with AKI found at that time to have left upper extremity DVT in addition to polyarticular gout He also came in with difficulty with urination found to have SIRS criteria with fever 102.8-CT chest no embolism no pneumonia urine analysis showed negative leukocytes negative nitrites However urine culture grew E. coli He has had intractable pain in upper extremities arms and swelling of his joints and he does not remember ever getting tapped on his joints but on review of chart as above it looks that he has been thoroughly worked up in the past for other rheumatological disease process and was supposed to follow-up with Dr. Estanislado Pandy rheumatology in the outpatient setting-it is unclear if this was ever done    Assessment & Plan:   Principal Problem:   SIRS (systemic inflammatory response syndrome) ([place]) Active Problems:   Essential hypertension   Gout   Iron deficiency anemia   Elevated troponin   Generalized weakness   Uncontrolled type 2 diabetes mellitus with hyperglycemia, without long-term current use of insulin ([place])   Acute deep vein thrombosis (DVT) of brachial vein of left upper extremity ([place])   1. Polyarticular gout with osteoarthritis a. Previous work-up with rheumatoid factor and ANA were all [-] b. Started on admission prednisone low-dose 40 mg which seems to  help c. Discontinued allopurinol as this can sometimes worsen gout flares and started and start colchicine twice daily and monitor for effect-his pain in his joints is improving some today he is able to move them a little better d. Therapy services to evaluate 2. Nausea and vomiting a. Might be related to gastroparesis or constipation b. We will ask to give sorbitol c. If worsens will consider imaging of abdomen 3. Insulin-dependent diabetes mellitus recent J6B 7.1 with complication of neuropathy nephropathy a. Sugars 151-158, continue Lantus fifteen continue sliding scale b. ?  Not sure if he is taking insulin at home based on formulary c. 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 DVT 06/2020 a. On apixaban five twice daily for the same needs at least 3 to 4 months of treatment 5.  HTN a. Continue metoprolol fifty XL-monitor clear controlled 6. Iron deficiency anemia a. Iron studies performed on admission showed low iron and TSAT b. Replete with IV iron.today c. Outpatient screening for GI blood loss d. Continue in addition ferrous sulfate 325 daily 7. Prior bilateral knee surgeries a. Immobile it seems and has severe onychogryphosis as below b. We will asked therapy to see not emergently probably secondary to diabetic 8. Severe onychogryphosis 9. Underlying CKD two 10. Moderate to severe hypokalemia a. Replace with K. Dur forty twice daily b. Magnesium is also being replaced being replaced c. Mildly acidotic but monitor trends not severe 11. possible urinary infection a. UA was negative however urine culture is growing over 100,000 CFU therefore will transition him to Keflex 500 twice daily for 3 days given uncertainty  b. Repeat labs a.m.  DVT prophylaxis: Lovenox Code Status: Full Family Communication: None present Disposition:   Status is: Observation  The patient will require care spanning > 2 midnights and should be moved to inpatient  because: Hemodynamically unstable, Ongoing active pain requiring inpatient pain management, Unsafe d/c plan, IV treatments appropriate due to intensity of illness or inability to take PO and Inpatient level of care appropriate due to severity of illness  Dispo:  Patient From: Home  Planned Disposition: Other  Expected discharge date: 08/14/20  Medically stable for discharge: No   Consultants:   None  Procedures: None  Antimicrobials: Keflex started 9/25   Subjective: Does not feel good-feels nauseous Tells me this is not new but has happened only since being in the hospital this time has not really eaten breakfast Has not had a stool in several days and is willing to take a laxative Abdomen seems pretty distended His pain in his shoulder and his right arm are improved to some degree he is able to move them a little better although his fingers are still quite swollen He has no fever or chills today and does not complain of any burning in the urine  Objective: Vitals:   08/11/20 1828 08/11/20 2200 08/12/20 0620 08/12/20 0956  BP: (!) 149/98 (!) 146/102 (!) 135/110 136/88  Pulse: (!) 102 88 94 92  Resp: (!) 22 20 20    Temp: (!) 100.9 F (38.3 C) 98.9 F (37.2 C) 98.6 F (37 C)   TempSrc: Oral Oral Oral   SpO2: 97% 96% 96%   Weight:      Height:        Intake/Output Summary (Last 24 hours) at 08/12/2020 1118 Last data filed at 08/12/2020 0500 Gross per 24 hour  Intake 1290 ml  Output 2400 ml  Net -1110 ml   Filed Weights   08/11/20 0321  Weight: 106.2 kg    Examination:  General exam: EOMI NCAT obese thick neck Mallampati four Respiratory system: Chest is clear no rales no rhonchi Cardiovascular system: S1-S2 no murmur no rub no gallop Gastrointestinal system: Obese distended tympanic deferred rectal exam. Central nervous system: Neurologically intact no focal deficit but pain limits motion of right shoulder-able to balance and left elbow a little better than  prior Extremities: Swollen digits painful to metacarpophalangeal joints Skin: Severe onychogryphosis and stenosis changes of lower extremities Psychiatry: Slightly flat potassium down to 2.8  Data Reviewed: I have personally reviewed following labs and imaging studies CO2 twenty-one BUN/creatinine 18/0.9 magnesium 1.7 AST/ALT 72/50 Hemoglobin down from 8.9-7.7 White count 10.5 Iron level seventeen saturation ratio of thirteen  Radiology Studies: CT Angio Chest PE W and/or Wo Contrast  Result Date: 08/10/2020 CLINICAL DATA:  PE suspected, high prob known dvt in the arm and has elevated trop today EXAM: CT ANGIOGRAPHY CHEST WITH CONTRAST TECHNIQUE: Multidetector CT imaging of the chest was performed using the standard protocol during bolus administration of intravenous contrast. Multiplanar CT image reconstructions and MIPs were obtained to evaluate the vascular anatomy. CONTRAST:  168mL OMNIPAQUE IOHEXOL 350 MG/ML SOLN COMPARISON:  Chest CTA 2 days ago 08/08/2020 FINDINGS: Cardiovascular: Examination is again moderately motion degraded. There are no filling defects within the central pulmonary arteries to the lobar level to suggest pulmonary embolus. Segmental and subsegmental evaluation is limited, particularly in the lower lobes. No aortic dissection or aneurysm. Borderline cardiomegaly. No pericardial effusion. Mediastinum/Nodes: No enlarged mediastinal or hilar lymph nodes. No suspicious thyroid nodule. No esophageal wall thickening. Tiny  hiatal hernia. Lungs/Pleura: Mild dependent atelectasis in the right greater than left lung. Motion degrades detailed assessment. No confluent airspace disease. No findings of pulmonary edema. No pleural fluid. Trachea and central bronchi are patent. Upper Abdomen: Partially included gaseous distension of transverse colon. No colonic wall thickening. Musculoskeletal: There are no acute or suspicious osseous abnormalities. Again seen diffuse degenerative change in  the spine. Review of the MIP images confirms the above findings. IMPRESSION: 1. Motion limited examination. No evidence of pulmonary embolus to the lobar level. 2. Mild dependent atelectasis. Electronically Signed   By: Keith Rake M.D.   On: 08/10/2020 21:50   DG Chest Port 1 View  Result Date: 08/10/2020 CLINICAL DATA:  Questionable sepsis evaluate for cause EXAM: PORTABLE CHEST 1 VIEW COMPARISON:  08/08/2020 FINDINGS: Trachea midline. Cardiomediastinal contours and hilar structures are normal. Lungs are clear. No lobar consolidation. No sign of pleural effusion. Limited assessment of skeletal structures without acute process. IMPRESSION: No acute cardiopulmonary disease. Electronically Signed   By: Zetta Bills M.D.   On: 08/10/2020 20:37   ECHOCARDIOGRAM COMPLETE  Result Date: 08/11/2020    ECHOCARDIOGRAM REPORT   Patient Name:   [name] Date of Exam: 08/11/2020 Medical Rec #:  [id number]      Height:       65.0 in Accession #:    [id number]     Weight:       234.1 lb Date of Birth:  [birth year]     BSA:          2.115 m Patient Age:    12 years       BP:           158/105 mmHg Patient Gender: M              HR:           107 bpm. Exam Location:  Inpatient Procedure: 2D Echo Indications:    CHF 428.21  History:        Patient has no prior history of Echocardiogram examinations.                 Risk Factors:Hypertension, Diabetes and Dyslipidemia.  Sonographer:    Jannett Celestine RDCS (AE) Referring Phys: [id number] Sherryll Burger [facility]  Sonographer Comments: Patient is morbidly obese and Technically difficult study due to poor echo windows. Image acquisition challenging due to respiratory motion, restricted mobiltiy and Image acquisition challenging due to patient body habitus. IMPRESSIONS  1. Very difficult study. Globally, LV function is normal ~65-70%. RV function appears normal.  2. Left ventricular ejection fraction, by estimation, is 65 to 70%. The left ventricle has normal function. Left  ventricular endocardial border not optimally defined to evaluate regional wall motion. Indeterminate diastolic filling due to E-A fusion.  3. Right ventricular systolic function is normal. The right ventricular size is normal. Tricuspid regurgitation signal is inadequate for assessing PA pressure.  4. The mitral valve is grossly normal. Trivial mitral valve regurgitation. No evidence of mitral stenosis.  5. The aortic valve is grossly normal. Aortic valve regurgitation is not visualized. No aortic stenosis is present. FINDINGS  Left Ventricle: Left ventricular ejection fraction, by estimation, is 65 to 70%. The left ventricle has normal function. Left ventricular endocardial border not optimally defined to evaluate regional wall motion. The left ventricular internal cavity size was normal in size. There is no left ventricular hypertrophy. Indeterminate diastolic filling due to E-A fusion. Right Ventricle: The right ventricular size is normal. No increase  in right ventricular wall thickness. Right ventricular systolic function is normal. Tricuspid regurgitation signal is inadequate for assessing PA pressure. Left Atrium: Left atrial size was not well visualized. Right Atrium: Right atrial size was not well visualized. Pericardium: Trivial pericardial effusion is present. Presence of pericardial fat pad. Mitral Valve: The mitral valve is grossly normal. Trivial mitral valve regurgitation. No evidence of mitral valve stenosis. Tricuspid Valve: The tricuspid valve is grossly normal. Tricuspid valve regurgitation is trivial. No evidence of tricuspid stenosis. Aortic Valve: The aortic valve is grossly normal. Aortic valve regurgitation is not visualized. No aortic stenosis is present. Pulmonic Valve: The pulmonic valve was grossly normal. Pulmonic valve regurgitation is not visualized. No evidence of pulmonic stenosis. Aorta: The aortic root is normal in size and structure. Venous: The inferior vena cava was not well  visualized. IAS/Shunts: The interatrial septum was not well visualized.  LEFT VENTRICLE PLAX 2D LVIDd:         4.60 cm LVIDs:         2.80 cm LV PW:         1.00 cm LV IVS:        0.90 cm LVOT diam:     2.30 cm LV SV:         46 LV SV Index:   22 LVOT Area:     4.15 cm  LEFT ATRIUM         Index LA diam:    3.70 cm 1.75 cm/m  AORTIC VALVE LVOT Vmax:   79.60 cm/s LVOT Vmean:  53.200 cm/s LVOT VTI:    0.110 m  AORTA Ao Root diam: 2.90 cm  SHUNTS Systemic VTI:  0.11 m Systemic Diam: 2.30 cm Eleonore Chiquito MD Electronically signed by Eleonore Chiquito MD Signature Date/Time: 08/11/2020/3:29:54 PM    Final      Scheduled Meds:  apixaban  5 mg Oral BID   atorvastatin  40 mg Oral q1800   colchicine  0.6 mg Oral BID   ferrous sulfate  325 mg Oral Q breakfast   insulin aspart  0-15 Units Subcutaneous TID AC & HS   insulin glargine  15 Units Subcutaneous BID   magnesium oxide  400 mg Oral Daily   metoCLOPramide  5 mg Oral TID AC   metoprolol succinate  50 mg Oral Daily   pantoprazole  40 mg Oral QHS   potassium chloride  40 mEq Oral BID   predniSONE  40 mg Oral QAC breakfast   sorbitol  15 mL Oral Daily   traZODone  50 mg Oral QHS   Continuous Infusions:   LOS: 0 days    Time spent: Forty-five nausea vomiting   Nita Sells, MD [facility] To contact the attending provider between 7A-7P or the covering provider during after hours 7P-7A, please log into the web site [email or web address] and access using universal [place] password for that web site. If you do not have the password, please call the hospital operator.  08/12/2020, 11:18 AM

## 2020-08-13 DIAGNOSIS — R6510 Systemic inflammatory response syndrome (SIRS) of non-infectious origin without acute organ dysfunction: Secondary | ICD-10-CM | POA: Diagnosis not present

## 2020-08-13 LAB — COMPREHENSIVE METABOLIC PANEL
ALT: 67 U/L — ABNORMAL HIGH (ref 0–44)
AST: 83 U/L — ABNORMAL HIGH (ref 15–41)
Albumin: 2.3 g/dL — ABNORMAL LOW (ref 3.5–5.0)
Alkaline Phosphatase: 97 U/L (ref 38–126)
Anion gap: 14 (ref 5–15)
BUN: 22 mg/dL — ABNORMAL HIGH (ref 6–20)
CO2: 23 mmol/L (ref 22–32)
Calcium: 8.7 mg/dL — ABNORMAL LOW (ref 8.9–10.3)
Chloride: 106 mmol/L (ref 98–111)
Creatinine, Ser: 1.14 mg/dL (ref 0.61–1.24)
GFR calc Af Amer: >60 mL/min (ref >60)
GFR calc non Af Amer: >60 mL/min (ref >60)
Glucose, Bld: 135 mg/dL — ABNORMAL HIGH (ref 70–99)
Potassium: 2.7 mmol/L — CL (ref 3.5–5.1)
Sodium: 143 mmol/L (ref 135–145)
Total Bilirubin: 0.5 mg/dL (ref 0.3–1.2)
Total Protein: 6.6 g/dL (ref 6.5–8.1)

## 2020-08-13 LAB — CBC WITH DIFFERENTIAL/PLATELET
Abs Immature Granulocytes: 0.1 10*3/uL — ABNORMAL HIGH (ref 0.00–0.07)
Basophils Absolute: 0 10*3/uL (ref 0.0–0.1)
Basophils Relative: 0 %
Eosinophils Absolute: 0 10*3/uL (ref 0.0–0.5)
Eosinophils Relative: 0 %
HCT: 26.3 % — ABNORMAL LOW (ref 39.0–52.0)
Hemoglobin: 8.4 g/dL — ABNORMAL LOW (ref 13.0–17.0)
Immature Granulocytes: 1 %
Lymphocytes Relative: 11 %
Lymphs Abs: 1.2 10*3/uL (ref 0.7–4.0)
MCH: 24.3 pg — ABNORMAL LOW (ref 26.0–34.0)
MCHC: 31.9 g/dL (ref 30.0–36.0)
MCV: 76 fL — ABNORMAL LOW (ref 80.0–100.0)
Monocytes Absolute: 0.9 10*3/uL (ref 0.1–1.0)
Monocytes Relative: 9 %
Neutro Abs: 8.5 10*3/uL — ABNORMAL HIGH (ref 1.7–7.7)
Neutrophils Relative %: 79 %
Platelets: 387 10*3/uL (ref 150–400)
RBC: 3.46 MIL/uL — ABNORMAL LOW (ref 4.22–5.81)
RDW: 19.9 % — ABNORMAL HIGH (ref 11.5–15.5)
WBC: 10.8 10*3/uL — ABNORMAL HIGH (ref 4.0–10.5)
nRBC: 0 % (ref 0.0–0.2)

## 2020-08-13 LAB — GLUCOSE, CAPILLARY
Glucose-Capillary: 133 mg/dL — ABNORMAL HIGH (ref 70–99)
Glucose-Capillary: 136 mg/dL — ABNORMAL HIGH (ref 70–99)
Glucose-Capillary: 158 mg/dL — ABNORMAL HIGH (ref 70–99)
Glucose-Capillary: 157 mg/dL — ABNORMAL HIGH (ref 70–99)

## 2020-08-13 LAB — URINE CULTURE: Culture: >=100000 — AB

## 2020-08-13 LAB — MAGNESIUM: Magnesium: 1.9 mg/dL (ref 1.7–2.4)

## 2020-08-13 MED ORDER — METOPROLOL SUCCINATE ER 50 MG PO TB24
75.0000 mg | ORAL_TABLET | Freq: Every day | ORAL | Status: DC
  Administered 2020-08-14: 75 mg via ORAL
  Filled 2020-08-13: qty 1

## 2020-08-13 MED ORDER — POTASSIUM CHLORIDE 2 MEQ/ML IV SOLN
INTRAVENOUS | Status: DC
  Filled 2020-08-13 (×7): qty 1000

## 2020-08-13 MED ORDER — POTASSIUM CHLORIDE 2 MEQ/ML IV SOLN: INTRAVENOUS | Status: DC

## 2020-08-13 NOTE — Plan of Care (Signed)
  Problem: Consults Goal: RH ADOLESCENT PATIENT EDUCATION Description: Description: See Patient Education module for education specifics. Outcome: Progressing Goal: Skin Care Protocol Initiated - if Braden Score 18 or less Description: If consults are not indicated, leave blank or document N/A Outcome: Progressing Goal: Nutrition Consult-if indicated Outcome: Progressing Goal: Diabetes Guidelines if Diabetic/Glucose > 140 Description: If diabetic or lab glucose is > 140 mg/dl - Initiate Diabetes/Hyperglycemia Guidelines & Document Interventions  Outcome: Progressing   Problem: RH SKIN INTEGRITY Goal: RH STG SKIN FREE OF INFECTION/BREAKDOWN Outcome: Progressing   Problem: RH SAFETY Goal: RH STG ADHERE TO SAFETY PRECAUTIONS W/ASSISTANCE/DEVICE Description: STG Adhere to Safety Precautions With Assistance/Device. Outcome: Progressing Goal: RH STG DECREASED RISK OF FALL WITH ASSISTANCE Description: STG Decreased Risk of Fall With Assistance. Outcome: Progressing

## 2020-08-13 NOTE — NC FL2 (Signed)
[place] LEVEL OF CARE SCREENING TOOL     IDENTIFICATION  Patient Name: [name] Birthdate: 05/15/1961 Sex: male Admission Date (Current Location): 08/10/2020  [facility] and [place] Number:  Herbalist and Address:  [facility],  [place] [address] [place]      Provider Number: [id number]  Attending Physician Name and Address:  Nita Sells, MD  Relative Name and Phone Number:  [name] Significant other [phone]  [phone]    Current Level of Care: Hospital Recommended Level of Care: [place] Prior Approval Number:    Date Approved/Denied:   PASRR Number: [id number]  Discharge Plan: SNF    Current Diagnoses: Patient Active Problem List   Diagnosis Date Noted  . SIRS (systemic inflammatory response syndrome) ([place]) 08/11/2020  . Elevated troponin 08/11/2020  . Generalized weakness 08/11/2020  . Uncontrolled type 2 diabetes mellitus with hyperglycemia, without long-term current use of insulin ([place]) 08/11/2020  . Acute deep vein thrombosis (DVT) of brachial vein of left upper extremity ([place]) 08/11/2020  . Nausea and vomiting 07/18/2020  . Tachycardia 07/11/2020  . Neurocognitive deficits 07/11/2020  . Non-traumatic rhabdomyolysis   . Arthralgia   . Left elbow pain   . Left hand pain   . AKI (acute kidney injury) ([place]) 06/28/2020  . Pain due to onychomycosis of toenails of both feet 12/17/2019  . Class 3 severe obesity due to excess calories with serious comorbidity and body mass index (BMI) of 45.0 to 49.9 in adult ([place]) 09/23/2018  . Diabetic neuropathy ([place]) 09/08/2017  . Insomnia 12/26/2016  . Scrotal abscess 10/09/2016  . Acute meniscal tear of right knee 12/29/2015  . Acute meniscal tear of left knee 12/29/2015  . Other specified crystal arthropathies, left knee 12/29/2015  . Other specified crystal arthropathies, right knee 12/29/2015  . Arthritis of knee   . Inability to  ambulate due to knee   . Iron deficiency anemia   . Bilateral knee pain 12/23/2015  . Essential hypertension 12/23/2015  . Chronic mid back pain 12/23/2015  . Gout 12/23/2015  . Osteoarthritis of both knees 12/23/2015  . Intractable pain 12/23/2015  . Normocytic anemia 12/23/2015  . Gouty bursitis, elbow  10/18/2015  . DM (diabetes mellitus), type 2 with neurological complications ([place]) 99/83/3825    Orientation RESPIRATION BLADDER Height & Weight     Self, Time, Situation, Place  Normal Incontinent Weight: 234 lb 2.1 oz (106.2 kg) Height:  5\' 5"  (165.1 cm)  BEHAVIORAL SYMPTOMS/MOOD NEUROLOGICAL BOWEL NUTRITION STATUS      Continent Diet ([place])  AMBULATORY STATUS COMMUNICATION OF NEEDS Skin   Limited Assist Verbally Normal                       Personal Care Assistance Level of Assistance  Bathing, Feeding, Dressing Bathing Assistance: Limited assistance Feeding assistance: Independent Dressing Assistance: Limited assistance     Functional Limitations Info  Sight, Speech, Hearing Sight Info: Adequate Hearing Info: Adequate Speech Info: Adequate    SPECIAL CARE FACTORS FREQUENCY  PT (By licensed PT), OT (By licensed OT)     PT Frequency: Minimum 5x a week OT Frequency: Minimum 5x a week            Contractures Contractures Info: Not present    Additional Factors Info  Code Status, Allergies, Insulin Sliding Scale Code Status Info: Full Code Allergies Info: Hctz   Insulin Sliding Scale Info: insulin aspart (novoLOG) injection 0-15 Units 3x  a day with meals       Current Medications (08/13/2020):  This is the current hospital active medication list Current Facility-Administered Medications  Medication Dose Route Frequency Provider Last Rate Last Admin  . acetaminophen (TYLENOL) tablet 650 mg  650 mg Oral Q6H PRN Vernelle Emerald, MD   650 mg at 08/11/20 1831   Or  . acetaminophen (TYLENOL) suppository 650 mg  650 mg Rectal Q6H PRN Shalhoub,  Sherryll Burger, MD      . apixaban (ELIQUIS) tablet 5 mg  5 mg Oral BID Shalhoub, Sherryll Burger, MD   5 mg at 08/13/20 0930  . atorvastatin (LIPITOR) tablet 40 mg  40 mg Oral q1800 Vernelle Emerald, MD   40 mg at 08/12/20 1755  . cephALEXin (KEFLEX) capsule 500 mg  500 mg Oral Q12H Nita Sells, MD   500 mg at 08/13/20 0929  . cloNIDine (CATAPRES) tablet 0.1 mg  0.1 mg Oral BID PRN Nita Sells, MD   0.1 mg at 08/13/20 1349  . colchicine tablet 0.6 mg  0.6 mg Oral BID Nita Sells, MD   0.6 mg at 08/13/20 0929  . cyclobenzaprine (FLEXERIL) tablet 10 mg  10 mg Oral TID PRN Vernelle Emerald, MD   10 mg at 08/12/20 2158  . ferrous sulfate tablet 325 mg  325 mg Oral Q breakfast Shalhoub, Sherryll Burger, MD   325 mg at 08/13/20 0630  . insulin aspart (novoLOG) injection 0-15 Units  0-15 Units Subcutaneous TID AC & HS Shalhoub, Sherryll Burger, MD   2 Units at 08/13/20 1136  . insulin glargine (LANTUS) injection 15 Units  15 Units Subcutaneous BID Vernelle Emerald, MD   15 Units at 08/13/20 [id number]  . lactated ringers 1,000 mL with potassium chloride 40 mEq infusion   Intravenous Continuous Nita Sells, MD 75 mL/hr at 08/13/20 0820 New Bag at 08/13/20 0820  . magnesium oxide (MAG-OX) tablet 400 mg  400 mg Oral Daily Shalhoub, Sherryll Burger, MD   400 mg at 08/13/20 0929  . metoCLOPramide (REGLAN) 10 MG/10ML solution 5 mg  5 mg Oral TID AC Shalhoub, Sherryll Burger, MD   5 mg at 08/13/20 1136  . [START ON 08/14/2020] metoprolol succinate (TOPROL-XL) 24 hr tablet 75 mg  75 mg Oral Daily Samtani, Jai-Gurmukh, MD      . ondansetron (ZOFRAN) tablet 4 mg  4 mg Oral Q6H PRN Shalhoub, Sherryll Burger, MD   4 mg at 08/12/20 2158   Or  . ondansetron (ZOFRAN) injection 4 mg  4 mg Intravenous Q6H PRN Vernelle Emerald, MD   4 mg at 08/12/20 0956  . pantoprazole (PROTONIX) EC tablet 40 mg  40 mg Oral QHS Vernelle Emerald, MD   40 mg at 08/12/20 2158  . polyethylene glycol (MIRALAX / GLYCOLAX) packet 17 g  17 g Oral Daily  PRN Shalhoub, Sherryll Burger, MD      . predniSONE (DELTASONE) tablet 40 mg  40 mg Oral QAC breakfast Nita Sells, MD   40 mg at 08/13/20 0808  . sorbitol 70 % solution 15 mL  15 mL Oral Daily Nita Sells, MD   15 mL at 08/13/20 1033  . traZODone (DESYREL) tablet 50 mg  50 mg Oral QHS Shalhoub, Sherryll Burger, MD   50 mg at 08/12/20 2158     Discharge Medications: Please see discharge summary for a list of discharge medications.  Relevant Imaging Results:  Relevant Lab Results:   Additional Information SSN [id number]  Benjamyn Hestand,  Jones Broom, LCSW

## 2020-08-13 NOTE — Progress Notes (Signed)
CRITICAL VALUE ALERT  Critical Value:  K 2.7  Date & Time Notied: 08/13/2020 7:27 am  Provider Notified: Dr. Verlon Au  Orders Received/Actions taken:New orders received 7:40am

## 2020-08-13 NOTE — Progress Notes (Signed)
PROGRESS NOTE    [name]  TWS:[id number] DOB: 12/16/1960 DOA: 08/10/2020 PCP: Charlott Rakes, MD  Brief Narrative:  59 year old black male with known history of IDDM since 2012, HTN, chronic gout Prior scrotal abscess Rx 2017 drained at that time Severe onychomycosis Severe tricompartmental osteoarthritis both knees He is status post bilateral arthroscopies with meniscectomies by Dr. Berenice Primas in 2017 He has had work-up in the past found to be ANA negative RF negative CCP negative and felt to be a combination of his chronic gout and possible osteoarthritis He was recently hospitalized 8/11 through 8/24 with AKI found at that time to have left upper extremity DVT in addition to polyarticular gout He also came in with difficulty with urination found to have SIRS criteria with fever 102.8-CT chest no embolism no pneumonia urine analysis showed negative leukocytes negative nitrites However urine culture grew E. coli He has had intractable pain in upper extremities arms and swelling of his joints and he does not remember ever getting tapped on his joints but on review of chart as above it looks that he has been thoroughly worked up in the past for other rheumatological disease process and was supposed to follow-up with Dr. Estanislado Pandy rheumatology in the outpatient setting-it is unclear if this was ever done    Assessment & Plan:   Principal Problem:   SIRS (systemic inflammatory response syndrome) ([place]) Active Problems:   Essential hypertension   Gout   Iron deficiency anemia   Elevated troponin   Generalized weakness   Uncontrolled type 2 diabetes mellitus with hyperglycemia, without long-term current use of insulin ([place])   Acute deep vein thrombosis (DVT) of brachial vein of left upper extremity ([place])   1. Polyarticular gout with osteoarthritis a. Previous work-up with rheumatoid factor and ANA were all [-] b. Continuing prednisone 40 c. Discontinued allopurinol on  admission-started colchicine d. Therapy s recommending skilled placement 2. Nausea and vomiting a. Good bowel movements yesterday-continuing sorbitol b. Should get abdominal x-ray in the morning given still distended to rule out any other pathology 3. Insulin-dependent diabetes mellitus recent G0F 7.1 with complication of neuropathy nephropathy a. CBG 1 33-1 67 controlled on 15 units of Lantus twice daily in addition to sliding scale b. Usually is on 50 units twice daily is 7030 at home although it is not on his MAR c. We will probably resume this at a much lower dose on discharge d. 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 DVT 06/2020 a. On apixaban twice daily for now-circumscribed course may be 3 to 4 months 5.  HTN a. Not controlled therefore increase to metoprolol XL 75 daily b. Clonidine for blood pressure above 160 and titrate as needed c. May need to add other agents 6. Iron deficiency anemia  a. Given IV iron 9/25 secondary to low iron levels b. Outpatient screening for GI blood loss c. Continue in addition ferrous sulfate 325 daily 7. Prior bilateral knee surgeries a. Immobile it seems and has severe onychogryphosis as below b. Will need skilled placement 8. Severe onychogryphosis 9. Underlying CKD ii 10. Moderate to severe hypokalemia a. Replace with K. Dur forty twice daily b. LR +40 K@75  cc/h 11. possible urinary infection a. UA was negative however urine culture is growing over 100,000 CFU therefore will transition him to Keflex 500 twice daily for 3 days given uncertainty b. Mild white count elevation  DVT prophylaxis: Lovenox Code Status: Full Family Communication: None present Disposition:   Status  is: Observation  The patient will require care spanning > 2 midnights and should be moved to inpatient because: Hemodynamically unstable, Ongoing active pain requiring inpatient pain management, Unsafe d/c plan, IV treatments  appropriate due to intensity of illness or inability to take PO and Inpatient level of care appropriate due to severity of illness  Dispo:  Patient From: Home  Planned Disposition: Other  Expected discharge date: 08/14/20  Medically stable for discharge: No   Consultants:   None  Procedures: None  Antimicrobials: Keflex started 9/25   Subjective: Awake oriented pleasant no distress Moving around a little bit more eating better and had 2 large stools yesterday according to nursing staff Still having some abdominal discomfort No chest pain Joint pains are better in the hands however he still has pain in his right shoulder-he tells me this is chronic and has had this for years  Objective: Vitals:   08/13/20 0441 08/13/20 0526 08/13/20 0641 08/13/20 0929  BP: (!) 164/115 (!) 159/110 (!) 160/99 (!) 162/90  Pulse: 97 99 90 90  Resp: 17     Temp: 98.5 F (36.9 C) 98.2 F (36.8 C)    TempSrc: Oral Oral    SpO2: 95% 94% 95%   Weight:      Height:        Intake/Output Summary (Last 24 hours) at 08/13/2020 1001 Last data filed at 08/13/2020 0437 Gross per 24 hour  Intake 200 ml  Output 1100 ml  Net -900 ml   Filed Weights   08/11/20 0321  Weight: 106.2 kg    Examination:  Thick neck pleasant oriented more conversant Chest clear no added sound Abdomen obese slight tender no rebound no guarding quite distended 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 ROM intact but weak in right shoulder and limited Hand seem less swollen than prior   Data Reviewed: I have personally reviewed following labs and imaging studies Potassium 2.7 BUN/creatinine 22/1.1 White count 10.8 Hemoglobin 7.7-9 g 8.4 Platelet 387  Radiology Studies: ECHOCARDIOGRAM COMPLETE  Result Date: 08/11/2020    ECHOCARDIOGRAM REPORT   Patient Name:   [name] Date of Exam: 08/11/2020 Medical Rec #:  [id number]      Height:       65.0 in Accession #:    [id number]      Weight:       234.1 lb Date of Birth:  02/09/1961     BSA:          2.115 m Patient Age:    59 years       BP:           158/105 mmHg Patient Gender: M              HR:           107 bpm. Exam Location:  Inpatient Procedure: 2D Echo Indications:    CHF 428.21  History:        Patient has no prior history of Echocardiogram examinations.                 Risk Factors:Hypertension, Diabetes and Dyslipidemia.  Sonographer:    Jannett Celestine RDCS (AE) Referring Phys: [id number] Sherryll Burger [facility]  Sonographer Comments: Patient is morbidly obese and Technically difficult study due to poor echo windows. Image acquisition challenging due to respiratory motion, restricted mobiltiy and Image acquisition challenging due to patient body habitus. IMPRESSIONS  1. Very difficult study. Globally, LV function is normal ~65-70%. RV  function appears normal.  2. Left ventricular ejection fraction, by estimation, is 65 to 70%. The left ventricle has normal function. Left ventricular endocardial border not optimally defined to evaluate regional wall motion. Indeterminate diastolic filling due to E-A fusion.  3. Right ventricular systolic function is normal. The right ventricular size is normal. Tricuspid regurgitation signal is inadequate for assessing PA pressure.  4. The mitral valve is grossly normal. Trivial mitral valve regurgitation. No evidence of mitral stenosis.  5. The aortic valve is grossly normal. Aortic valve regurgitation is not visualized. No aortic stenosis is present. FINDINGS  Left Ventricle: Left ventricular ejection fraction, by estimation, is 65 to 70%. The left ventricle has normal function. Left ventricular endocardial border not optimally defined to evaluate regional wall motion. The left ventricular internal cavity size was normal in size. There is no left ventricular hypertrophy. Indeterminate diastolic filling due to E-A fusion. Right Ventricle: The right ventricular size is normal. No increase in right ventricular  wall thickness. Right ventricular systolic function is normal. Tricuspid regurgitation signal is inadequate for assessing PA pressure. Left Atrium: Left atrial size was not well visualized. Right Atrium: Right atrial size was not well visualized. Pericardium: Trivial pericardial effusion is present. Presence of pericardial fat pad. Mitral Valve: The mitral valve is grossly normal. Trivial mitral valve regurgitation. No evidence of mitral valve stenosis. Tricuspid Valve: The tricuspid valve is grossly normal. Tricuspid valve regurgitation is trivial. No evidence of tricuspid stenosis. Aortic Valve: The aortic valve is grossly normal. Aortic valve regurgitation is not visualized. No aortic stenosis is present. Pulmonic Valve: The pulmonic valve was grossly normal. Pulmonic valve regurgitation is not visualized. No evidence of pulmonic stenosis. Aorta: The aortic root is normal in size and structure. Venous: The inferior vena cava was not well visualized. IAS/Shunts: The interatrial septum was not well visualized.  LEFT VENTRICLE PLAX 2D LVIDd:         4.60 cm LVIDs:         2.80 cm LV PW:         1.00 cm LV IVS:        0.90 cm LVOT diam:     2.30 cm LV SV:         46 LV SV Index:   22 LVOT Area:     4.15 cm  LEFT ATRIUM         Index LA diam:    3.70 cm 1.75 cm/m  AORTIC VALVE LVOT Vmax:   79.60 cm/s LVOT Vmean:  53.200 cm/s LVOT VTI:    0.110 m  AORTA Ao Root diam: 2.90 cm  SHUNTS Systemic VTI:  0.11 m Systemic Diam: 2.30 cm Eleonore Chiquito MD Electronically signed by Eleonore Chiquito MD Signature Date/Time: 08/11/2020/3:29:54 PM    Final      Scheduled Meds: . apixaban  5 mg Oral BID  . atorvastatin  40 mg Oral q1800  . cephALEXin  500 mg Oral Q12H  . colchicine  0.6 mg Oral BID  . ferrous sulfate  325 mg Oral Q breakfast  . insulin aspart  0-15 Units Subcutaneous TID AC & HS  . insulin glargine  15 Units Subcutaneous BID  . magnesium oxide  400 mg Oral Daily  . metoCLOPramide  5 mg Oral TID AC  .  metoprolol succinate  50 mg Oral Daily  . pantoprazole  40 mg Oral QHS  . predniSONE  40 mg Oral QAC breakfast  . sorbitol  15 mL Oral Daily  . traZODone  50 mg Oral QHS   Continuous Infusions: . lactated ringers with kcl 75 mL/hr at 08/13/20 0820     LOS: 1 day    Time spent: Forty-five nausea vomiting   Nita Sells, MD [facility] To contact the attending provider between 7A-7P or the covering provider during after hours 7P-7A, please log into the web site [email or web address] and access using universal [place] password for that web site. If you do not have the password, please call the hospital operator.  08/13/2020, 10:01 AM

## 2020-08-13 NOTE — Progress Notes (Signed)
Pt's sister-in law Morton Peters called to f/u on Pt's status. Pt gave the permission to writer to talk her and update her on his status.

## 2020-08-14 ENCOUNTER — Inpatient Hospital Stay (HOSPITAL_COMMUNITY): Payer: Medicare HMO

## 2020-08-14 DIAGNOSIS — R6510 Systemic inflammatory response syndrome (SIRS) of non-infectious origin without acute organ dysfunction: Secondary | ICD-10-CM | POA: Diagnosis not present

## 2020-08-14 LAB — CBC WITH DIFFERENTIAL/PLATELET
Abs Immature Granulocytes: 0.36 10*3/uL — ABNORMAL HIGH (ref 0.00–0.07)
Basophils Absolute: 0 10*3/uL (ref 0.0–0.1)
Basophils Relative: 0 %
Eosinophils Absolute: 0 10*3/uL (ref 0.0–0.5)
Eosinophils Relative: 0 %
HCT: 26.6 % — ABNORMAL LOW (ref 39.0–52.0)
Hemoglobin: 8.2 g/dL — ABNORMAL LOW (ref 13.0–17.0)
Immature Granulocytes: 4 %
Lymphocytes Relative: 12 %
Lymphs Abs: 1.1 10*3/uL (ref 0.7–4.0)
MCH: 24.2 pg — ABNORMAL LOW (ref 26.0–34.0)
MCHC: 30.8 g/dL (ref 30.0–36.0)
MCV: 78.5 fL — ABNORMAL LOW (ref 80.0–100.0)
Monocytes Absolute: 0.6 10*3/uL (ref 0.1–1.0)
Monocytes Relative: 7 %
Neutro Abs: 7.3 10*3/uL (ref 1.7–7.7)
Neutrophils Relative %: 77 %
Platelets: 380 10*3/uL (ref 150–400)
RBC: 3.39 MIL/uL — ABNORMAL LOW (ref 4.22–5.81)
RDW: 20.4 % — ABNORMAL HIGH (ref 11.5–15.5)
WBC: 9.4 10*3/uL (ref 4.0–10.5)
nRBC: 0.5 % — ABNORMAL HIGH (ref 0.0–0.2)

## 2020-08-14 LAB — COMPREHENSIVE METABOLIC PANEL
ALT: 60 U/L — ABNORMAL HIGH (ref 0–44)
AST: 50 U/L — ABNORMAL HIGH (ref 15–41)
Albumin: 2.5 g/dL — ABNORMAL LOW (ref 3.5–5.0)
Alkaline Phosphatase: 91 U/L (ref 38–126)
Anion gap: 14 (ref 5–15)
BUN: 29 mg/dL — ABNORMAL HIGH (ref 6–20)
CO2: 22 mmol/L (ref 22–32)
Calcium: 8.9 mg/dL (ref 8.9–10.3)
Chloride: 108 mmol/L (ref 98–111)
Creatinine, Ser: 1.06 mg/dL (ref 0.61–1.24)
GFR calc Af Amer: >60 mL/min (ref >60)
GFR calc non Af Amer: >60 mL/min (ref >60)
Glucose, Bld: 146 mg/dL — ABNORMAL HIGH (ref 70–99)
Potassium: 3.7 mmol/L (ref 3.5–5.1)
Sodium: 144 mmol/L (ref 135–145)
Total Bilirubin: 0.5 mg/dL (ref 0.3–1.2)
Total Protein: 6.5 g/dL (ref 6.5–8.1)

## 2020-08-14 LAB — MAGNESIUM: Magnesium: 2 mg/dL (ref 1.7–2.4)

## 2020-08-14 LAB — HEPARIN LEVEL (UNFRACTIONATED): Heparin Unfractionated: >2.2 IU/mL — ABNORMAL HIGH (ref 0.30–0.70)

## 2020-08-14 LAB — GLUCOSE, CAPILLARY
Glucose-Capillary: 152 mg/dL — ABNORMAL HIGH (ref 70–99)
Glucose-Capillary: 168 mg/dL — ABNORMAL HIGH (ref 70–99)
Glucose-Capillary: 169 mg/dL — ABNORMAL HIGH (ref 70–99)
Glucose-Capillary: 164 mg/dL — ABNORMAL HIGH (ref 70–99)

## 2020-08-14 LAB — APTT: aPTT: 39 seconds — ABNORMAL HIGH (ref 24–36)

## 2020-08-14 MED ORDER — METOPROLOL TARTRATE 5 MG/5ML IV SOLN
5.0000 mg | Freq: Four times a day (QID) | INTRAVENOUS | Status: DC
  Administered 2020-08-14 – 2020-08-16 (×7): 5 mg via INTRAVENOUS
  Filled 2020-08-14 (×7): qty 5

## 2020-08-14 MED ORDER — SODIUM CHLORIDE 0.9 % IV SOLN
1.0000 g | INTRAVENOUS | Status: DC
  Administered 2020-08-14: 1 g via INTRAVENOUS
  Filled 2020-08-14 (×2): qty 10

## 2020-08-14 MED ORDER — IOHEXOL 300 MG/ML  SOLN
100.0000 mL | Freq: Once | INTRAMUSCULAR | Status: AC | PRN
  Administered 2020-08-14: 100 mL via INTRAVENOUS

## 2020-08-14 MED ORDER — IOHEXOL 9 MG/ML PO SOLN
500.0000 mL | ORAL | Status: AC
  Administered 2020-08-14 (×2): 500 mL via ORAL

## 2020-08-14 MED ORDER — HEPARIN (PORCINE) 25000 UT/250ML-% IV SOLN
850.0000 [IU]/h | INTRAVENOUS | Status: DC
  Administered 2020-08-14 – 2020-08-15 (×2): 1000 [IU]/h via INTRAVENOUS
  Administered 2020-08-16 – 2020-08-18 (×2): 850 [IU]/h via INTRAVENOUS
  Filled 2020-08-14 (×4): qty 250

## 2020-08-14 MED ORDER — IOHEXOL 9 MG/ML PO SOLN
ORAL | Status: AC
  Filled 2020-08-14: qty 1000

## 2020-08-14 MED ORDER — METOCLOPRAMIDE HCL 5 MG/ML IJ SOLN
5.0000 mg | Freq: Three times a day (TID) | INTRAMUSCULAR | Status: DC
  Administered 2020-08-14 – 2020-09-06 (×70): 5 mg via INTRAVENOUS
  Filled 2020-08-14 (×70): qty 2

## 2020-08-14 NOTE — Consult Note (Signed)
[facility] Surgery Consult Note  [name] [birth year]  [id number].    Requesting MD: Alice Rieger Chief Complaint: abdominal pain Reason for Consult: colonic ileus with sigmoid distension 8.3 cm  HPI:  Patient is a 59 year old male who presented to the ED on 08/10/2020 complaining of leg swelling and being unable to walk.  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  Additionally had acute gouty flareup of multiple joints and the left elbow effusion.  He was discharged to a skilled nursing facility and was most recently seen by Dr. Unice Cobble with [facility].  He was seen on 07/18/2020 with acute nausea and vomiting, tachycardia.  He has a history of diabetic gastroparesis and he was started on a trial of Reglan.  He was also noted to have issues with constipation was placed on MiraLAX twice daily.  He has been discharged from skilled nursing facility and now presents ED with the above-noted complaints.    He was admitted by Medicine, with generalized weakness, malaise, and fevers.  He is not a great historian.  He is unsure how long he has had abdominal distention.  He thinks he has had it since admission, but then tells me he has been admitted for a long time.  When I told him just since 9/23, he looked at my very puzzled.  He had a fever here up to 102.8.  He states he had a fever at home of 110 and I questioned 101 and he said yeah whatever (in a polite way).  He has had some nausea and vomiting since being here.  He was otherwise eating well at home prior to admission apparently.  He had an episode of emesis today. He had a large loose BM today as well along with flatus.  He denies any abdominal pain.  However, because of his distention, a CT was ordered which revealed  a colonic ileus with some pneumatosis of the ascending/right colon.  We have been asked to see him for further evaluation.   ROS: ROS: Please see HPI, otherwise all other systems have been reviewed and are negative  Family History  Problem Relation Age of Onset  . Diabetes Mother   . Diabetes Father     Past Medical History:  Diagnosis Date  . Gouty bursitis, elbow  10/18/2015  . Hyperlipidemia   . Hypertension   . Knee pain, chronic   . Type II diabetes mellitus ([place])     Past Surgical History:  Procedure Laterality Date  . COLONOSCOPY    . KNEE ARTHROSCOPY Bilateral 12/29/2015   Procedure: ARTHROSCOPY KNEE BILATERAL WITH  PARTIAL MEDIAL AND LATERAL MENISCECTOMY AND FOUR COMPARTMENT SYNOVECTOMY , CHONDRALPLASTIES, PATELLA-FEMORAL CHONDRALPLASTIES BILATERALLY;  Surgeon: Dorna Leitz, MD;  Location: [facility];  Service: Orthopedics;  Laterality: Bilateral;    Social History:  reports that he has never smoked. He has never used smokeless tobacco. He reports that he does not drink alcohol and does not use drugs.  Allergies:  Allergies  Allergen Reactions  . Hctz [Hydrochlorothiazide]     Hx of gout; uric acid 14.1 on 03/13/20 Acute gout L elbow 8/11-8/24/21    Medications Prior to Admission  Medication Sig Dispense Refill  . Accu-Chek FastClix Lancets MISC 1 each by Does not apply route 3 (three) times daily. as directed 102 each 12  .  Alcohol Swabs (B-D SINGLE USE SWABS REGULAR) PADS Please use as instructed before checking blood sugar and before injecting insulin. 100 each 11  . allopurinol (ZYLOPRIM) 300 MG tablet Take 1 tablet (300 mg total) by mouth daily. 30 tablet 0  . apixaban (ELIQUIS) 5 MG TABS tablet Take 1 tablet (5 mg total) by mouth 2 (two) times daily. 60 tablet 0  . APIXABAN (ELIQUIS) VTE STARTER PACK (10MG AND 5MG) Take as directed on package: start with two-48m tablets twice daily for 7 days. On day 8, switch to one-547mtablet twice daily. 1 each 0  . atorvastatin  (LIPITOR) 40 MG tablet Take 1 tablet (40 mg total) by mouth daily at 6 PM. 30 tablet 0  . Blood Glucose Calibration (ACCU-CHEK AVIVA) SOLN Use to calibrate your glucometer when needed. (Patient not taking: Reported on 07/11/2020) 1 each 0  . Blood Glucose Monitoring Suppl (ACCU-CHEK AVIVA PLUS) w/Device KIT USE AS INSTRUCTED THREE TIMES DAILY. E11.69 (Patient not taking: Reported on 07/11/2020) 1 kit 0  . cyclobenzaprine (FLEXERIL) 10 MG tablet Take 1 tablet (10 mg total) by mouth 3 (three) times daily as needed. For Muscle Pain 30 tablet 0  . diclofenac Sodium (VOLTAREN) 1 % GEL Apply 2 g topically 4 (four) times daily. 100 g 0  . DROPLET PEN NEEDLES 32G X 4 MM MISC USE AS DIRECTED DAILY TO ADMINISTER VICTOZA (Patient not taking: Reported on 07/11/2020) 100 each 6  . ferrous sulfate 325 (65 FE) MG tablet Take 1 tablet (325 mg total) by mouth daily with breakfast. For Iron Deficiency Anemia 30 tablet 0  . glucose blood (ACCU-CHEK AVIVA PLUS) test strip USE AS INSTRUCTED THREE TIMES DAILY. E11.69 (Patient not taking: Reported on 07/11/2020) 100 each 12  . Insulin Pen Needle (PEN NEEDLES) 30G X 8 MM MISC Use as directed (Patient not taking: Reported on 07/11/2020) 200 each 0  . Magnesium Oxide 200 MG TABS Take 2 tablets (400 mg total) by mouth daily. Take 2 tabs to = 400 mg 30 tablet 0  . metoCLOPramide (REGLAN) 5 MG/5ML solution Take 5 mLs (5 mg total) by mouth 3 (three) times daily before meals. 473 mL 0  . metoprolol succinate (TOPROL-XL) 50 MG 24 hr tablet Take 1 tablet (50 mg total) by mouth daily. Take with or immediately following a meal. 30 tablet 0  . NON FORMULARY Diet- Renal / CCD Regular Diet    . pantoprazole (PROTONIX) 40 MG tablet Take 1 tablet (40 mg total) by mouth at bedtime. 30 tablet 0  . traZODone (DESYREL) 50 MG tablet Take 1 tablet (50 mg total) by mouth at bedtime. 30 tablet 0    Blood pressure 105/69, pulse 70, temperature 97.6 F (36.4 C), temperature source Oral, resp. rate (!)  21, height '5\' 5"'  (1.651 m), weight 106.2 kg, SpO2 96 %. Physical Exam: General: pleasant, obese black male who is laying in bed in NAD HEENT: head is normocephalic, atraumatic.  Sclera are noninjected.  PERRL.  Ears and nose without any masses or lesions.  Mouth is pink and moist Heart: regular, rate, and rhythm.  Normal s1,s2. No obvious murmurs, gallops, or rubs noted.  Palpable radial and pedal pulses bilaterally Lungs: CTAB, no wheezes, rhonchi, or rales noted.  Respiratory effort nonlabored Abd: tight, tympanitic, distended, some BS present, completely nontender diffusely.  Unable to assess organomegaly. No masses noted.  Reducible umbilical hernia noted.  MS: all 4 extremities are symmetrical with no cyanosis, clubbing, or edema. Skin: warm and dry  with no masses, lesions, or rashes Neuro: Cranial nerves 2-12 grossly intact, sensation is normal throughout Psych: A&Ox3 with an appropriate affect.   Results for orders placed or performed during the hospital encounter of 08/10/20 (from the past 48 hour(s))  Glucose, capillary     Status: Abnormal   Collection Time: 08/12/20  5:01 PM  Result Value Ref Range   Glucose-Capillary 176 (H) 70 - 99 mg/dL    Comment: Glucose reference range applies only to samples taken after fasting for at least 8 hours.  Glucose, capillary     Status: Abnormal   Collection Time: 08/12/20  9:20 PM  Result Value Ref Range   Glucose-Capillary 167 (H) 70 - 99 mg/dL    Comment: Glucose reference range applies only to samples taken after fasting for at least 8 hours.  CBC with Differential/Platelet     Status: Abnormal   Collection Time: 08/13/20  5:57 AM  Result Value Ref Range   WBC 10.8 (H) 4.0 - 10.5 K/uL   RBC 3.46 (L) 4.22 - 5.81 MIL/uL   Hemoglobin 8.4 (L) 13.0 - 17.0 g/dL    Comment: Reticulocyte Hemoglobin testing may be clinically indicated, consider ordering this additional test [id number]    HCT 26.3 (L) 39 - 52 %   MCV 76.0 (L) 80.0 - 100.0 fL    MCH 24.3 (L) 26.0 - 34.0 pg   MCHC 31.9 30.0 - 36.0 g/dL   RDW 19.9 (H) 11.5 - 15.5 %   Platelets 387 150 - 400 K/uL   nRBC 0.0 0.0 - 0.2 %   Neutrophils Relative % 79 %   Neutro Abs 8.5 (H) 1.7 - 7.7 K/uL   Lymphocytes Relative 11 %   Lymphs Abs 1.2 0.7 - 4.0 K/uL   Monocytes Relative 9 %   Monocytes Absolute 0.9 0 - 1 K/uL   Eosinophils Relative 0 %   Eosinophils Absolute 0.0 0 - 0 K/uL   Basophils Relative 0 %   Basophils Absolute 0.0 0 - 0 K/uL   Immature Granulocytes 1 %   Abs Immature Granulocytes 0.10 (H) 0.00 - 0.07 K/uL    Comment: Performed at [facility], [place] [address], [place], [place] [address]  Comprehensive metabolic panel     Status: Abnormal   Collection Time: 08/13/20  5:57 AM  Result Value Ref Range   Sodium 143 135 - 145 mmol/L   Potassium 2.7 (LL) 3.5 - 5.1 mmol/L    Comment: CRITICAL RESULT CALLED TO, READ BACK BY AND VERIFIED WITH: J,HUFF AT 0725 ON 08/13/20 BY A,MOHAMED    Chloride 106 98 - 111 mmol/L   CO2 23 22 - 32 mmol/L   Glucose, Bld 135 (H) 70 - 99 mg/dL    Comment: Glucose reference range applies only to samples taken after fasting for at least 8 hours.   BUN 22 (H) 6 - 20 mg/dL   Creatinine, Ser 1.14 0.61 - 1.24 mg/dL   Calcium 8.7 (L) 8.9 - 10.3 mg/dL   Total Protein 6.6 6.5 - 8.1 g/dL   Albumin 2.3 (L) 3.5 - 5.0 g/dL   AST 83 (H) 15 - 41 U/L   ALT 67 (H) 0 - 44 U/L   Alkaline Phosphatase 97 38 - 126 U/L   Total Bilirubin 0.5 0.3 - 1.2 mg/dL   GFR calc non Af Amer >60 >60 mL/min   GFR calc Af Amer >60 >60 mL/min   Anion gap 14 5 - 15    Comment:  Performed at [facility], [place] [address], [place], [place] [address]  Magnesium     Status: None   Collection Time: 08/13/20  5:57 AM  Result Value Ref Range   Magnesium 1.9 1.7 - 2.4 mg/dL    Comment: Performed at [facility], [place] [address], [place], [place] [address]  Glucose, capillary     Status: Abnormal   Collection Time:  08/13/20  7:59 AM  Result Value Ref Range   Glucose-Capillary 133 (H) 70 - 99 mg/dL    Comment: Glucose reference range applies only to samples taken after fasting for at least 8 hours.  Glucose, capillary     Status: Abnormal   Collection Time: 08/13/20 11:23 AM  Result Value Ref Range   Glucose-Capillary 136 (H) 70 - 99 mg/dL    Comment: Glucose reference range applies only to samples taken after fasting for at least 8 hours.  Glucose, capillary     Status: Abnormal   Collection Time: 08/13/20  4:52 PM  Result Value Ref Range   Glucose-Capillary 158 (H) 70 - 99 mg/dL    Comment: Glucose reference range applies only to samples taken after fasting for at least 8 hours.  Glucose, capillary     Status: Abnormal   Collection Time: 08/13/20  9:20 PM  Result Value Ref Range   Glucose-Capillary 157 (H) 70 - 99 mg/dL    Comment: Glucose reference range applies only to samples taken after fasting for at least 8 hours.  CBC with Differential/Platelet     Status: Abnormal   Collection Time: 08/14/20  5:01 AM  Result Value Ref Range   WBC 9.4 4.0 - 10.5 K/uL   RBC 3.39 (L) 4.22 - 5.81 MIL/uL   Hemoglobin 8.2 (L) 13.0 - 17.0 g/dL   HCT 26.6 (L) 39 - 52 %   MCV 78.5 (L) 80.0 - 100.0 fL   MCH 24.2 (L) 26.0 - 34.0 pg   MCHC 30.8 30.0 - 36.0 g/dL   RDW 20.4 (H) 11.5 - 15.5 %   Platelets 380 150 - 400 K/uL   nRBC 0.5 (H) 0.0 - 0.2 %   Neutrophils Relative % 77 %   Neutro Abs 7.3 1.7 - 7.7 K/uL   Lymphocytes Relative 12 %   Lymphs Abs 1.1 0.7 - 4.0 K/uL   Monocytes Relative 7 %   Monocytes Absolute 0.6 0 - 1 K/uL   Eosinophils Relative 0 %   Eosinophils Absolute 0.0 0 - 0 K/uL   Basophils Relative 0 %   Basophils Absolute 0.0 0 - 0 K/uL   Immature Granulocytes 4 %   Abs Immature Granulocytes 0.36 (H) 0.00 - 0.07 K/uL    Comment: Performed at [facility], [place] [address], [place], [place] [address]  Comprehensive metabolic panel     Status: Abnormal   Collection Time:  08/14/20  5:01 AM  Result Value Ref Range   Sodium 144 135 - 145 mmol/L   Potassium 3.7 3.5 - 5.1 mmol/L    Comment: DELTA CHECK NOTED NO VISIBLE HEMOLYSIS    Chloride 108 98 - 111 mmol/L   CO2 22 22 - 32 mmol/L   Glucose, Bld 146 (H) 70 - 99 mg/dL    Comment: Glucose reference range applies only to samples taken after fasting for at least 8 hours.   BUN 29 (H) 6 - 20 mg/dL   Creatinine, Ser 1.06 0.61 - 1.24 mg/dL   Calcium 8.9 8.9 - 10.3  mg/dL   Total Protein 6.5 6.5 - 8.1 g/dL   Albumin 2.5 (L) 3.5 - 5.0 g/dL   AST 50 (H) 15 - 41 U/L   ALT 60 (H) 0 - 44 U/L   Alkaline Phosphatase 91 38 - 126 U/L   Total Bilirubin 0.5 0.3 - 1.2 mg/dL   GFR calc non Af Amer >60 >60 mL/min   GFR calc Af Amer >60 >60 mL/min   Anion gap 14 5 - 15    Comment: Performed at [facility], [place] [address], [place], [place] [address]  Magnesium     Status: None   Collection Time: 08/14/20  5:01 AM  Result Value Ref Range   Magnesium 2.0 1.7 - 2.4 mg/dL    Comment: Performed at [facility], [place] [address] [place], [place] [address]  Glucose, capillary     Status: Abnormal   Collection Time: 08/14/20  7:44 AM  Result Value Ref Range   Glucose-Capillary 152 (H) 70 - 99 mg/dL    Comment: Glucose reference range applies only to samples taken after fasting for at least 8 hours.  Glucose, capillary     Status: Abnormal   Collection Time: 08/14/20 12:21 PM  Result Value Ref Range   Glucose-Capillary 168 (H) 70 - 99 mg/dL    Comment: Glucose reference range applies only to samples taken after fasting for at least 8 hours.   CT ABDOMEN PELVIS W CONTRAST  Result Date: 08/14/2020 CLINICAL DATA:  Provided history of: Bowel obstruction suspected (Ped 5-18y) Radiologic records states abdominal pain and distension. EXAM: CT ABDOMEN AND PELVIS WITH CONTRAST TECHNIQUE: Multidetector CT imaging of the abdomen and pelvis was performed using the standard protocol following bolus  administration of intravenous contrast. CONTRAST:  145m OMNIPAQUE IOHEXOL 300 MG/ML  SOLN COMPARISON:  Abdominal radiograph earlier today. FINDINGS: Lower chest: Mild cardiomegaly. Medial left lower lobe atelectasis. Trace left pleural effusions/thickening. Hepatobiliary: Diffusely decreased hepatic density consistent with steatosis. No focal hepatic lesion. High-density contents in the gallbladder likely representing sludge. There is no pericholecystic inflammation. No biliary dilatation. There is no portal venous gas. Pancreas: Fatty atrophy.  No ductal dilatation or inflammation. Spleen: Normal in size without focal abnormality. Adrenals/Urinary Tract: Mild left adrenal thickening. Normal right adrenal gland. No dominant adrenal nodule. Bilateral renal parenchymal atrophy with lobular contours. No hydronephrosis. There is symmetric bilateral perinephric edema. Cortical low densities within both kidneys are too small to accurately characterize but likely cysts. There is excretion from both kidneys on delayed phase imaging. Urinary bladder is only minimally distended. Mild bladder wall thickening. Stomach/Bowel: There is diffuse gaseous and fluid distension of the entire colon. Greatest colonic distension in the sigmoid measures 8.3 cm. There is air in the bowel wall involving the ascending and hepatic flexure consistent with pneumatosis. No associated colonic wall thickening. Occasional pericolonic edema involving the ascending colon as well as sigmoid colon. The sigmoid colon is tortuous. There is no evidence of focal bowel mass or cause for obstruction. There is no small bowel obstruction, enteric contrast reaches the colon. No small bowel inflammation. No small bowel pneumatosis. Normal appendix. Ingested material within the stomach. Normal positioning of the duodenum and ligament of Treitz. Vascular/Lymphatic: Normal caliber abdominal aorta. Mild aorto bi-iliac atherosclerosis. The celiac and SMA arteries are  patent without evidence of severe stenosis or bowel disease. There is no mesenteric venous or portal venous gas. The portal vein is patent mesenteric veins are patent. There is no evidence of mesenteric  venous thrombus. No abdominopelvic adenopathy. Reproductive: Prostate is unremarkable. Other: No free air or pneumoperitoneum. There is trace free fluid in the pericolic gutters without significant ascites. No abscess. No evidence of bowel perforation. Patchy edema in the subcutaneous tissues of the flanks, right greater than left. Subcutaneous densities in the lower anterior abdominal wall typical of medication injection sites. There is a tiny fat containing umbilical hernia. Musculoskeletal: There are no acute or suspicious osseous abnormalities. IMPRESSION: 1. Diffuse gaseous and fluid distension of the entire colon suggesting colonic ileus. Pneumatosis of the ascending and hepatic flexure of the colon. No associated colonic wall thickening or portal venous gas. This can be seen in the setting of bowel ischemia, however there are no secondary findings of bowel inflammation nor supporting mesenteric vascular findings. Benign causes of pneumatosis have also been reported. Recommend surgical consult. 2. Normal small bowel without obstruction. 3. Hepatic steatosis. 4. Bilateral renal parenchymal atrophy with lobular contours. 5. Mild bladder wall thickening, can be seen with urinary tract infection. Aortic Atherosclerosis (ICD10-I70.0). These results were called by telephone at the time of interpretation on 08/14/2020 at 4:04 pm to provider [facility] , who verbally acknowledged these results. Electronically Signed   By: Keith Rake M.D.   On: 08/14/2020 16:04   DG Abd 2 Views  Result Date: 08/14/2020 CLINICAL DATA:  Abdominal pain and distension EXAM: X-RAY ABDOMEN 2 VIEWS COMPARISON:  None. FINDINGS: Supine and lateral decubitus images obtained. There is generalized bowel dilatation with multiple  air-fluid levels. No free air appreciable. No abnormal calcifications. IMPRESSION: Diffuse bowel dilatation with multiple air-fluid levels. Question severe ileus versus a degree of distal bowel obstruction. No free air appreciable. These results will be called to the ordering clinician or representative by the Radiologist Assistant, and communication documented in the PACS or [employer]. Electronically Signed   By: Lowella Grip III M.D.   On: 08/14/2020 08:42      Assessment/Plan Gout Osteoarthritis IDDM LUE DVT on eliquis Anemia CKD Hypokalemia/hypomagnesemia  - continue to aggressively replace as low K and mag will slow down gut motility.  Ogilvie's syndrome/colonic ileus The patient does have evidence of a colonic ileus which is likely compounded by the last month of hospitalizations/SNF placement, etc where he has been ill as well as less mobile than normal.  Apparently at his baseline, he doesn't mobilize much and his roommate and children help him with a walker and or a cane.  He needs to mobilize as much as possible to help with gut motility, replace electrolytes, minimize pain meds (hasn't taken any today), etc.  GI is following patient.  They do not feel he warrants a decompressive colonoscopy at this time.  He does have evidence of pneumatosis on his CT scan, but his abdominal exam is completely benign along with a normal WBC and no further fevers.  He does not need any acute surgical intervention at this time.  We did discuss that if he were to worsen and perforated or have true ischemia, he may require a surgery, but that is not the case at this time.  We will continue to follow.  Henreitta Cea, [facility] Surgery 08/14/2020, 4:18 PM Please see Amion for pager number during day hours 7:00am-4:30pm

## 2020-08-14 NOTE — Consult Note (Signed)
UNASSIGNED PATIENT Reason for Consult: Abnormal CT scan with pneumatosis in the colonic wall. Referring Physician: [facility] hospitalist Dr. Verneita Griffes.  [name] is an 59 y.o. male.  HPI: [name] is a 59 year old black male with multiple medical problems listed below came to the emergency room on 08/10/2020 with leg pain and swelling and inability to ambulate.  He was hospitalized from 06/28/2020 to 07/11/2020 with AKI (acute stage II CKD)  With acute hyperkalemia related thought to be related to rhabdomyolysis from the use of statins.  Additionally has a history of left brachial DVT and toxic encephalopathy with poorly controlled type 2 diabetes.  He has had a long-term problem with gout with flareup in the recent past with multiple joint involvement and left elbow effusion. Patient was admitted with generalized weakness malaise fevers and joint pains and was noted to have slight abdominal distention for the last couple of days with nausea and vomiting.  He claims he has vomited small volume emesis twice today has had worsening of his abdominal distention.  A CT scan of the abdomen pelvis done today showed colonic ileus in the sigmoid colon with pneumatosis of the ascending colon hepatic flexure but there was no evidence of inflammation of the bowel or mesenteric vascular findings; additionally an atrophic pancreatitis and hepatic steatosis with bilateral renal parenchymal atrophy was noted with mild bladder wall thickening. GI consultation was procured to further evaluate the  pneumatosis and colonic ileus.  Patient denies having abdominal pain he has had a large-volume BM today there is no history of melena hematochezia.  He claims he has had multiple colonoscopies done by Dr. Lovey [facility] [place] in [place] the last one being about 4 years ago but I do not have access to those records at the present time.  Past Medical History:  Diagnosis Date  . Gouty bursitis, elbow  10/18/2015  .  Hyperlipidemia   . Hypertension   . Knee pain, chronic   . Type II diabetes mellitus ([place])    Past Surgical History:  Procedure Laterality Date  . COLONOSCOPY    . KNEE ARTHROSCOPY Bilateral 12/29/2015   Procedure: ARTHROSCOPY KNEE BILATERAL WITH  PARTIAL MEDIAL AND LATERAL MENISCECTOMY AND FOUR COMPARTMENT SYNOVECTOMY , CHONDRALPLASTIES, PATELLA-FEMORAL CHONDRALPLASTIES BILATERALLY;  Surgeon: Dorna Leitz, MD;  Location: [facility];  Service: Orthopedics;  Laterality: Bilateral;   Family History  Problem Relation Age of Onset  . Diabetes Mother   . Diabetes Father    Social History:  reports that he has never smoked. He has never used smokeless tobacco. He reports that he does not drink alcohol and does not use drugs.  Allergies:  Allergies  Allergen Reactions  . Hctz [Hydrochlorothiazide]     Hx of gout; uric acid 14.1 on 03/13/20 Acute gout L elbow 8/11-8/24/21   Medications: I have reviewed the patient's current medications.  Results for orders placed or performed during the hospital encounter of 08/10/20 (from the past 48 hour(s))  Glucose, capillary     Status: Abnormal   Collection Time: 08/12/20  5:01 PM  Result Value Ref Range   Glucose-Capillary 176 (H) 70 - 99 mg/dL    Comment: Glucose reference range applies only to samples taken after fasting for at least 8 hours.  Glucose, capillary     Status: Abnormal   Collection Time: 08/12/20  9:20 PM  Result Value Ref Range   Glucose-Capillary 167 (H) 70 - 99 mg/dL    Comment: Glucose reference range applies only to samples taken  after fasting for at least 8 hours.  CBC with Differential/Platelet     Status: Abnormal   Collection Time: 08/13/20  5:57 AM  Result Value Ref Range   WBC 10.8 (H) 4.0 - 10.5 K/uL   RBC 3.46 (L) 4.22 - 5.81 MIL/uL   Hemoglobin 8.4 (L) 13.0 - 17.0 g/dL    Comment: Reticulocyte Hemoglobin testing may be clinically indicated, consider ordering this additional test [id number]    HCT 26.3 (L) 39 - 52 %    MCV 76.0 (L) 80.0 - 100.0 fL   MCH 24.3 (L) 26.0 - 34.0 pg   MCHC 31.9 30.0 - 36.0 g/dL   RDW 19.9 (H) 11.5 - 15.5 %   Platelets 387 150 - 400 K/uL   nRBC 0.0 0.0 - 0.2 %   Neutrophils Relative % 79 %   Neutro Abs 8.5 (H) 1.7 - 7.7 K/uL   Lymphocytes Relative 11 %   Lymphs Abs 1.2 0.7 - 4.0 K/uL   Monocytes Relative 9 %   Monocytes Absolute 0.9 0 - 1 K/uL   Eosinophils Relative 0 %   Eosinophils Absolute 0.0 0 - 0 K/uL   Basophils Relative 0 %   Basophils Absolute 0.0 0 - 0 K/uL   Immature Granulocytes 1 %   Abs Immature Granulocytes 0.10 (H) 0.00 - 0.07 K/uL    Comment: Performed at [facility], [place] [address], [place], [place] [address]  Comprehensive metabolic panel     Status: Abnormal   Collection Time: 08/13/20  5:57 AM  Result Value Ref Range   Sodium 143 135 - 145 mmol/L   Potassium 2.7 (LL) 3.5 - 5.1 mmol/L    Comment: CRITICAL RESULT CALLED TO, READ BACK BY AND VERIFIED WITH: J,HUFF AT 0725 ON 08/13/20 BY A,MOHAMED    Chloride 106 98 - 111 mmol/L   CO2 23 22 - 32 mmol/L   Glucose, Bld 135 (H) 70 - 99 mg/dL    Comment: Glucose reference range applies only to samples taken after fasting for at least 8 hours.   BUN 22 (H) 6 - 20 mg/dL   Creatinine, Ser 1.14 0.61 - 1.24 mg/dL   Calcium 8.7 (L) 8.9 - 10.3 mg/dL   Total Protein 6.6 6.5 - 8.1 g/dL   Albumin 2.3 (L) 3.5 - 5.0 g/dL   AST 83 (H) 15 - 41 U/L   ALT 67 (H) 0 - 44 U/L   Alkaline Phosphatase 97 38 - 126 U/L   Total Bilirubin 0.5 0.3 - 1.2 mg/dL   GFR calc non Af Amer >60 >60 mL/min   GFR calc Af Amer >60 >60 mL/min   Anion gap 14 5 - 15    Comment: Performed at [facility], [place] [address], [place], [place] [address]  Magnesium     Status: None   Collection Time: 08/13/20  5:57 AM  Result Value Ref Range   Magnesium 1.9 1.7 - 2.4 mg/dL    Comment: Performed at [facility], [place] [address], [place], [place] [address]  Glucose, capillary     Status:  Abnormal   Collection Time: 08/13/20  7:59 AM  Result Value Ref Range   Glucose-Capillary 133 (H) 70 - 99 mg/dL    Comment: Glucose reference range applies only to samples taken after fasting for at least 8 hours.  Glucose, capillary     Status: Abnormal   Collection Time: 08/13/20 11:23 AM  Result Value Ref Range   Glucose-Capillary  136 (H) 70 - 99 mg/dL    Comment: Glucose reference range applies only to samples taken after fasting for at least 8 hours.  Glucose, capillary     Status: Abnormal   Collection Time: 08/13/20  4:52 PM  Result Value Ref Range   Glucose-Capillary 158 (H) 70 - 99 mg/dL    Comment: Glucose reference range applies only to samples taken after fasting for at least 8 hours.  Glucose, capillary     Status: Abnormal   Collection Time: 08/13/20  9:20 PM  Result Value Ref Range   Glucose-Capillary 157 (H) 70 - 99 mg/dL    Comment: Glucose reference range applies only to samples taken after fasting for at least 8 hours.  CBC with Differential/Platelet     Status: Abnormal   Collection Time: 08/14/20  5:01 AM  Result Value Ref Range   WBC 9.4 4.0 - 10.5 K/uL   RBC 3.39 (L) 4.22 - 5.81 MIL/uL   Hemoglobin 8.2 (L) 13.0 - 17.0 g/dL   HCT 26.6 (L) 39 - 52 %   MCV 78.5 (L) 80.0 - 100.0 fL   MCH 24.2 (L) 26.0 - 34.0 pg   MCHC 30.8 30.0 - 36.0 g/dL   RDW 20.4 (H) 11.5 - 15.5 %   Platelets 380 150 - 400 K/uL   nRBC 0.5 (H) 0.0 - 0.2 %   Neutrophils Relative % 77 %   Neutro Abs 7.3 1.7 - 7.7 K/uL   Lymphocytes Relative 12 %   Lymphs Abs 1.1 0.7 - 4.0 K/uL   Monocytes Relative 7 %   Monocytes Absolute 0.6 0 - 1 K/uL   Eosinophils Relative 0 %   Eosinophils Absolute 0.0 0 - 0 K/uL   Basophils Relative 0 %   Basophils Absolute 0.0 0 - 0 K/uL   Immature Granulocytes 4 %   Abs Immature Granulocytes 0.36 (H) 0.00 - 0.07 K/uL    Comment: Performed at [facility], [place] [address], [place], [place] [address]  Comprehensive metabolic panel     Status:  Abnormal   Collection Time: 08/14/20  5:01 AM  Result Value Ref Range   Sodium 144 135 - 145 mmol/L   Potassium 3.7 3.5 - 5.1 mmol/L    Comment: DELTA CHECK NOTED NO VISIBLE HEMOLYSIS    Chloride 108 98 - 111 mmol/L   CO2 22 22 - 32 mmol/L   Glucose, Bld 146 (H) 70 - 99 mg/dL    Comment: Glucose reference range applies only to samples taken after fasting for at least 8 hours.   BUN 29 (H) 6 - 20 mg/dL   Creatinine, Ser 1.06 0.61 - 1.24 mg/dL   Calcium 8.9 8.9 - 10.3 mg/dL   Total Protein 6.5 6.5 - 8.1 g/dL   Albumin 2.5 (L) 3.5 - 5.0 g/dL   AST 50 (H) 15 - 41 U/L   ALT 60 (H) 0 - 44 U/L   Alkaline Phosphatase 91 38 - 126 U/L   Total Bilirubin 0.5 0.3 - 1.2 mg/dL   GFR calc non Af Amer >60 >60 mL/min   GFR calc Af Amer >60 >60 mL/min   Anion gap 14 5 - 15    Comment: Performed at [facility], [place] [address] [place], [place] [address]  Magnesium     Status: None   Collection Time: 08/14/20  5:01 AM  Result Value Ref Range   Magnesium 2.0 1.7 - 2.4 mg/dL    Comment: Performed at  [facility], [place] [address], [place], [place] [address]  Glucose, capillary     Status: Abnormal   Collection Time: 08/14/20  7:44 AM  Result Value Ref Range   Glucose-Capillary 152 (H) 70 - 99 mg/dL    Comment: Glucose reference range applies only to samples taken after fasting for at least 8 hours.  Glucose, capillary     Status: Abnormal   Collection Time: 08/14/20 12:21 PM  Result Value Ref Range   Glucose-Capillary 168 (H) 70 - 99 mg/dL    Comment: Glucose reference range applies only to samples taken after fasting for at least 8 hours.    CT ABDOMEN PELVIS W CONTRAST  Result Date: 08/14/2020 CLINICAL DATA:  Provided history of: Bowel obstruction suspected (Ped 5-18y) Radiologic records states abdominal pain and distension. EXAM: CT ABDOMEN AND PELVIS WITH CONTRAST TECHNIQUE: Multidetector CT imaging of the abdomen and pelvis was performed using the  standard protocol following bolus administration of intravenous contrast. CONTRAST:  187mL OMNIPAQUE IOHEXOL 300 MG/ML  SOLN COMPARISON:  Abdominal radiograph earlier today. FINDINGS: Lower chest: Mild cardiomegaly. Medial left lower lobe atelectasis. Trace left pleural effusions/thickening. Hepatobiliary: Diffusely decreased hepatic density consistent with steatosis. No focal hepatic lesion. High-density contents in the gallbladder likely representing sludge. There is no pericholecystic inflammation. No biliary dilatation. There is no portal venous gas. Pancreas: Fatty atrophy.  No ductal dilatation or inflammation. Spleen: Normal in size without focal abnormality. Adrenals/Urinary Tract: Mild left adrenal thickening. Normal right adrenal gland. No dominant adrenal nodule. Bilateral renal parenchymal atrophy with lobular contours. No hydronephrosis. There is symmetric bilateral perinephric edema. Cortical low densities within both kidneys are too small to accurately characterize but likely cysts. There is excretion from both kidneys on delayed phase imaging. Urinary bladder is only minimally distended. Mild bladder wall thickening. Stomach/Bowel: There is diffuse gaseous and fluid distension of the entire colon. Greatest colonic distension in the sigmoid measures 8.3 cm. There is air in the bowel wall involving the ascending and hepatic flexure consistent with pneumatosis. No associated colonic wall thickening. Occasional pericolonic edema involving the ascending colon as well as sigmoid colon. The sigmoid colon is tortuous. There is no evidence of focal bowel mass or cause for obstruction. There is no small bowel obstruction, enteric contrast reaches the colon. No small bowel inflammation. No small bowel pneumatosis. Normal appendix. Ingested material within the stomach. Normal positioning of the duodenum and ligament of Treitz. Vascular/Lymphatic: Normal caliber abdominal aorta. Mild aorto bi-iliac  atherosclerosis. The celiac and SMA arteries are patent without evidence of severe stenosis or bowel disease. There is no mesenteric venous or portal venous gas. The portal vein is patent mesenteric veins are patent. There is no evidence of mesenteric venous thrombus. No abdominopelvic adenopathy. Reproductive: Prostate is unremarkable. Other: No free air or pneumoperitoneum. There is trace free fluid in the pericolic gutters without significant ascites. No abscess. No evidence of bowel perforation. Patchy edema in the subcutaneous tissues of the flanks, right greater than left. Subcutaneous densities in the lower anterior abdominal wall typical of medication injection sites. There is a tiny fat containing umbilical hernia. Musculoskeletal: There are no acute or suspicious osseous abnormalities. IMPRESSION: 1. Diffuse gaseous and fluid distension of the entire colon suggesting colonic ileus. Pneumatosis of the ascending and hepatic flexure of the colon. No associated colonic wall thickening or portal venous gas. This can be seen in the setting of bowel ischemia, however there are no secondary findings of bowel inflammation nor supporting mesenteric vascular  findings. Benign causes of pneumatosis have also been reported. Recommend surgical consult. 2. Normal small bowel without obstruction. 3. Hepatic steatosis. 4. Bilateral renal parenchymal atrophy with lobular contours. 5. Mild bladder wall thickening, can be seen with urinary tract infection. Aortic Atherosclerosis (ICD10-I70.0). These results were called by telephone at the time of interpretation on 08/14/2020 at 4:04 pm to provider [facility] , who verbally acknowledged these results. Electronically Signed   By: Keith Rake M.D.   On: 08/14/2020 16:04   DG Abd 2 Views  Result Date: 08/14/2020 CLINICAL DATA:  Abdominal pain and distension EXAM: X-RAY ABDOMEN 2 VIEWS COMPARISON:  None. FINDINGS: Supine and lateral decubitus images obtained. There  is generalized bowel dilatation with multiple air-fluid levels. No free air appreciable. No abnormal calcifications. IMPRESSION: Diffuse bowel dilatation with multiple air-fluid levels. Question severe ileus versus a degree of distal bowel obstruction. No free air appreciable. These results will be called to the ordering clinician or representative by the Radiologist Assistant, and communication documented in the PACS or [employer]. Electronically Signed   By: Lowella Grip III M.D.   On: 08/14/2020 08:42    Review of Systems  Constitutional: Negative for appetite change.  Eyes: Negative.   Respiratory: Negative.   Cardiovascular: Negative.   Gastrointestinal: Positive for abdominal distention, nausea, rectal pain and vomiting. Negative for abdominal pain, anal bleeding, blood in stool, constipation and diarrhea.  Endocrine: Negative.   Genitourinary: Negative.   Musculoskeletal: Positive for arthralgias, back pain, joint swelling and myalgias.  Skin: Negative.   Neurological: Positive for weakness.  Hematological: Negative.   Psychiatric/Behavioral: Negative.    Blood pressure 105/69, pulse 70, temperature 97.6 F (36.4 C), temperature source Oral, resp. rate (!) 21, height 5\' 5"  (1.651 m), weight 106.2 kg, SpO2 96 %. HEENT: ATNC PERRLA facial symmetry preserved Neck supple Chest clear to auscultation S1-S2 regular Abdomen obese, significantly distended distended with hypoactive bowel sounds nontender  Assessment/Plan: 1) Colonic ileus with pneumatosis in the ascending colon and hepatic flexure.  I suspect the patient's symptoms been worsened with with the use of narcotics over the last few days and therefore he has been advised to refrain from the use of all narcotics for now.  His electrolytes should be checked and corrected and he needs to change his position every 2 hours in bed. According to the nurse patient does not ambulate and does not get out of bed.  He tells me he uses  a walker at home. The patient should be kept n.p.o. for now with serial abdominal films.  I do not feel he needs any invasive procedures or surgery at this time as there is no evidence of ischemia on on the CT scan scan and clinically the patient does not appear to have evidence of bowel ischemia with a normal white cell count and no evidence of any fever and a nontender abdomen. 2) Gout/osteoarthritis. 3) IDDM/morbid obesity. 4) Chronic kidney disease/anemia of chronic disease. 5) Hypomagnesemia and hypokalemia replete aggressively.  Juanita Craver, MD 08/14/2020, 4:35 PM

## 2020-08-14 NOTE — Progress Notes (Signed)
ANTICOAGULATION CONSULT NOTE - Initial Consult  Pharmacy Consult for IV heparin  Indication: history of DVT  Allergies  Allergen Reactions  . Hctz [Hydrochlorothiazide]     Hx of gout; uric acid 14.1 on 03/13/20 Acute gout L elbow 8/11-8/24/21    Patient Measurements: Height: 5\' 5"  (165.1 cm) Weight: 106.2 kg (234 lb 2.1 oz) IBW/kg (Calculated) : 61.5 Heparin Dosing Weight: 85.7 kg  Vital Signs: Temp: 97.6 F (36.4 C) (09/27 1226) Temp Source: Oral (09/27 1226) BP: 105/69 (09/27 1421) Pulse Rate: 70 (09/27 1226)  Labs: Recent Labs    08/12/20 0557 08/12/20 0557 08/13/20 0557 08/14/20 0501  HGB 7.7*   < > 8.4* 8.2*  HCT 24.3*  --  26.3* 26.6*  PLT 334  --  387 380  CREATININE 0.97  --  1.14 1.06   < > = values in this interval not displayed.    Estimated Creatinine Clearance: 83.2 mL/min (by C-G formula based on SCr of 1.06 mg/dL).   Medical History: Past Medical History:  Diagnosis Date  . Gouty bursitis, elbow  10/18/2015  . Hyperlipidemia   . Hypertension   . Knee pain, chronic   . Type II diabetes mellitus ([place])     Assessment: 66 y/oM with PMH of DVT (07/03/2020) on Apixaban PTA admitted on 08/10/2020 with SIRS. Given worsening abdominal distention, CT abd pelvis done today, which revealed a colonic ileus with pneumatosis of the ascending and hepatic flexure of the colon. PO meds held. Pharmacy consulted for IV heparin dosing while Apixaban on hold. Last dose of Apixaban 5mg  PO given this morning at 0921. Note that recent Apixaban use may falsely elevate heparin levels.   Goal of Therapy:  aPTT 66-102 seconds Heparin level 0.3-0.7 units/ml Monitor platelets by anticoagulation protocol: Yes   Plan:   Baseline aPTT, heparin level prior to initiation of heparin infusion   Start heparin infusion at 1000 units/hr (based on previous history of levels/dosing on last admission) at 9:30pm tonight  aPTT 6 hours after initiation of heparin   Will use aPTT for  dose titration until heparin level and aPTT correlate.  Daily aPTT, heparin level, CBC  Monitor closely for s/sx of bleeding   Lindell Spar, PharmD, BCPS Clinical Pharmacist  08/14/2020,5:09 PM

## 2020-08-14 NOTE — Consult Note (Signed)
   [facility] CM Inpatient Consult   08/14/2020  [name] 1960/11/21 [id number]   Patient screened due to unplanned 30 day readmission and high risk score for unplanned readmission, 22%. Chart reviewed to assess for potential [facility] community service needs. Per review, patient is being recommended for a skilled nursing facility level of care.    No [facility] Care Management follow up needs at this time.   Netta Cedars, MSN, [facility] Liaison Nurse Mobile Phone [phone]  Toll free office [phone]

## 2020-08-14 NOTE — Progress Notes (Signed)
PROGRESS NOTE    [name]  WFU:[id number] DOB: 1961-08-25 DOA: 08/10/2020 PCP: Charlott Rakes, MD  Brief Narrative:  59 year old black male with known history of IDDM since 2012, HTN, chronic gout Prior scrotal abscess Rx 2017 drained at that time Severe onychomycosis Severe tricompartmental osteoarthritis both knees He is status post bilateral arthroscopies with meniscectomies by Dr. Berenice Primas in 2017 He has had work-up in the past found to be ANA negative RF negative CCP negative and felt to be a combination of his chronic gout and possible osteoarthritis He was recently hospitalized 8/11 through 8/24 with AKI found at that time to have left upper extremity DVT in addition to polyarticular gout He also came in with difficulty with urination found to have SIRS criteria with fever 102.8-CT chest no embolism no pneumonia urine analysis showed negative leukocytes negative nitrites However urine culture grew E. coli He has had intractable pain in upper extremities arms and swelling of his joints and he does not remember ever getting tapped on his joints but on review of chart as above it looks that he has been thoroughly worked up in the past for other rheumatological disease process and was supposed to follow-up with Dr. Estanislado Pandy rheumatology in the outpatient setting-it is unclear if this was ever done    Assessment & Plan:   Principal Problem:   SIRS (systemic inflammatory response syndrome) ([place]) Active Problems:   Essential hypertension   Gout   Iron deficiency anemia   Elevated troponin   Generalized weakness   Uncontrolled type 2 diabetes mellitus with hyperglycemia, without long-term current use of insulin ([place])   Acute deep vein thrombosis (DVT) of brachial vein of left upper extremity ([place])   1. Polyarticular gout with osteoarthritis a. Previous work-up with rheumatoid factor and ANA were all [-] b. Holding steroids at this time c. Discontinued allopurinol on  admission-have had to hold colchicine d. Therapy s recommending skilled placement 2. 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 [place] x-ray 9/27?  SBO-CT scan confirms pneumatosis I was called by radiology emergently b. GI and general surgery consulted-May benefit from decompression?  NG tube c. Delineate oral meds 3. Insulin-dependent diabetes mellitus recent K0U 7.1 with complication of neuropathy nephropathy a. CBG 1 33-1 67 controlled on 15 units of Lantus twice daily in addition to sliding scale b. Usually is on 50 units twice daily is 7030 at home although it is not on his MAR c. We will probably resume this at a much lower dose on discharge d. Change Reglan to IV 4. DVT 06/2020 a. On apixaban twice daily for now-transition to heparin for now given n.p.o. 5.  HTN a. Placed on IV Toprol b. Clonidine for blood pressure above 160 and titrate as needed c. May need to add other agents 6. Iron deficiency anemia  a. Given IV iron 9/25 secondary to low iron levels b. Outpatient screening for GI blood loss c. Resume iron once bowel obstruction clear 7. Prior bilateral knee surgeries a. Immobile it seems and has severe onychogryphosis as below b. Will need skilled placement 8. Severe onychogryphosis 9. Underlying CKD ii 10. Moderate to severe hypokalemia a. Repeat labs a.m. 11. possible urinary infection a. UA was negative however urine culture is growing over 100,000 CFU was on Keflex transition to ceftriaxone b. Mild white count elevation  DVT prophylaxis: Lovenox Code Status: Full Family Communication: None present Disposition:   Status is: Observation  The patient will require care spanning > 2 midnights and should be  moved to inpatient because: Hemodynamically unstable, Ongoing active pain requiring inpatient pain management, Unsafe d/c plan, IV treatments appropriate due to intensity of illness or inability to take PO and Inpatient level of care  appropriate due to severity of illness  Dispo:  Patient From: Home  Planned Disposition: Other  Expected discharge date: 08/14/20  Medically stable for discharge: No   Consultants:   None  Procedures: None  Antimicrobials: Keflex started 9/25   Subjective: Distended Not hungry Several episodes of small-volume vomiting today but also having some stool  Objective: Vitals:   08/13/20 2118 08/14/20 0623 08/14/20 1226 08/14/20 1421  BP: (!) 137/103 (!) 151/105 (!) 159/114 105/69  Pulse: 84 79 70   Resp: 20 20 (!) 21   Temp: 98.3 F (36.8 C) 97.7 F (36.5 C) 97.6 F (36.4 C)   TempSrc: Oral Oral Oral   SpO2: 97% 97% 96%   Weight:      Height:        Intake/Output Summary (Last 24 hours) at 08/14/2020 1642 Last data filed at 08/14/2020 1400 Gross per 24 hour  Intake 2276.94 ml  Output 550 ml  Net 1726.94 ml   Filed Weights   08/11/20 0321  Weight: 106.2 kg    Examination:  Conversant pleasant Chest clear no added sound Abdomen quite distended tympanic 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 EXAM: CT ABDOMEN AND PELVIS WITH CONTRAST TECHNIQUE: Multidetector CT imaging of the abdomen and pelvis was performed using the standard protocol following bolus administration of intravenous contrast. CONTRAST:  129mL OMNIPAQUE IOHEXOL 300 MG/ML  SOLN COMPARISON:  Abdominal radiograph earlier today. FINDINGS: Lower chest: Mild cardiomegaly. Medial left lower lobe  atelectasis. Trace left pleural effusions/thickening. Hepatobiliary: Diffusely decreased hepatic density consistent with steatosis. No focal hepatic lesion. High-density contents in the gallbladder likely representing sludge. There is no pericholecystic inflammation. No biliary dilatation. There is no portal venous gas. Pancreas: Fatty atrophy.  No ductal dilatation or inflammation. Spleen: Normal in size without focal abnormality. Adrenals/Urinary Tract: Mild left adrenal thickening. Normal right adrenal gland. No dominant adrenal nodule. Bilateral renal parenchymal atrophy with lobular contours. No hydronephrosis. There is symmetric bilateral perinephric edema. Cortical low densities within both kidneys are too small to accurately characterize but likely cysts. There is excretion from both kidneys on delayed phase imaging. Urinary bladder is only minimally distended. Mild bladder wall thickening. Stomach/Bowel: There is diffuse gaseous and fluid distension of the entire colon. Greatest colonic distension in the sigmoid measures 8.3 cm. There is air in the bowel wall involving the ascending and hepatic flexure consistent with pneumatosis. No associated colonic wall thickening. Occasional pericolonic edema involving the ascending colon as well as sigmoid colon. The sigmoid colon is tortuous. There is no evidence of focal bowel mass or cause for obstruction. There is no small bowel obstruction, enteric contrast reaches the colon. No small bowel inflammation. No small bowel pneumatosis. Normal appendix. Ingested material within the stomach. Normal positioning of the duodenum and ligament of Treitz. Vascular/Lymphatic: Normal caliber abdominal aorta. Mild aorto bi-iliac atherosclerosis. The celiac and SMA arteries are  patent without evidence of severe stenosis or bowel disease. There is no mesenteric venous or portal venous gas. The portal vein is patent mesenteric veins are patent. There is no evidence of mesenteric  venous thrombus. No abdominopelvic adenopathy. Reproductive: Prostate is unremarkable. Other: No free air or pneumoperitoneum. There is trace free fluid in the pericolic gutters without significant ascites. No abscess. No evidence of bowel perforation. Patchy edema in the subcutaneous tissues of the flanks, right greater than left. Subcutaneous densities in the lower anterior abdominal wall typical of medication injection sites. There is a tiny fat containing umbilical hernia. Musculoskeletal: There are no acute or suspicious osseous abnormalities. IMPRESSION: 1. Diffuse gaseous and fluid distension of the entire colon suggesting colonic ileus. Pneumatosis of the ascending and hepatic flexure of the colon. No associated colonic wall thickening or portal venous gas. This can be seen in the setting of bowel ischemia, however there are no secondary findings of bowel inflammation nor supporting mesenteric vascular findings. Benign causes of pneumatosis have also been reported. Recommend surgical consult. 2. Normal small bowel without obstruction. 3. Hepatic steatosis. 4. Bilateral renal parenchymal atrophy with lobular contours. 5. Mild bladder wall thickening, can be seen with urinary tract infection. Aortic Atherosclerosis (ICD10-I70.0). These results were called by telephone at the time of interpretation on 08/14/2020 at 4:04 pm to provider [facility] , who verbally acknowledged these results. Electronically Signed   By: Keith Rake M.D.   On: 08/14/2020 16:04   DG Abd 2 Views  Result Date: 08/14/2020 CLINICAL DATA:  Abdominal pain and distension EXAM: X-RAY ABDOMEN 2 VIEWS COMPARISON:  None. FINDINGS: Supine and lateral decubitus images obtained. There is generalized bowel dilatation with multiple air-fluid levels. No free air appreciable. No abnormal calcifications. IMPRESSION: Diffuse bowel dilatation with multiple air-fluid levels. Question severe ileus versus a degree of distal bowel  obstruction. No free air appreciable. These results will be called to the ordering clinician or representative by the Radiologist Assistant, and communication documented in the PACS or [employer]. Electronically Signed   By: Lowella Grip III M.D.   On: 08/14/2020 08:42     Scheduled Meds: . insulin glargine  15 Units Subcutaneous BID  . iohexol      . metoCLOPramide (REGLAN) injection  5 mg Intravenous Q8H  . metoprolol tartrate  5 mg Intravenous Q6H   Continuous Infusions:    LOS: 2 days    Time spent: 50 including care coordination time of GI and general surgery   Nita Sells, MD [facility] To contact the attending provider between 7A-7P or the covering provider during after hours 7P-7A, please log into the web site [email or web address] and access using universal [place] password for that web site. If you do not have the password, please call the hospital operator.  08/14/2020, 4:42 PM

## 2020-08-14 NOTE — TOC Progression Note (Signed)
Transition of Care [facility]) - Progression Note    Patient Details  Name: [name] MRN: [id number] Date of Birth: 1961-06-20  Transition of Care [facility]) CM/SW Contact  Meiko Ives, Juliann Pulse, RN Phone Number: 08/14/2020, 2:54 PM  Clinical Narrative:   1. 1.7 mi [place] at [place], [place] [address] [phone] Overall rating Below average 2. 2 mi [facility] at the [place], [place] [address] [phone] Overall rating Below average 3. 2.1 mi [place] at [place], [place] [address] [phone] Overall rating Much below average 4. 2.2 mi [place] [facility] [employer] [place], [place] [address] [phone] Overall rating Much above average 5. 2.2 mi [place], [place] [address] [phone] Overall rating Below average 6. 2.7 [place], [place] [address] [phone] Overall rating Much above average 7. 3.2 mi [place] [address] [place], [place] [address] [phone] Overall rating Below average 8. 3.3 [place] [address] [place], [place] [address] [phone] Overall rating Below average 9. 3.5 mi [facility] [place], [place] [address] [phone] Overall rating Average 10. 4.3 [place], [place] [address] [phone] Overall rating Below average 11. 4.7 mi [place] at [place], [place] [address] [phone] Overall rating Much above average 12. 5.2 mi [facility] [address] [place], [place] [address] [phone] Overall rating Much  above average 13. 6.2 mi [place], [place] [address] [phone] Overall rating Average 14. 8.1 [place], [place] [address] [phone] Overall rating Above average 15. 8.1 mi [facility] and [place],  [address] [phone] Overall rating Much below average <Previous 1 2 3  All Next> Data last updated: July 12, 2020 To explore and download nursing home data,visit the data catalog on CMS.gov           Expected Discharge Plan and Services                                                 Social Determinants of Health (SDOH) Interventions    Readmission Risk Interventions No flowsheet data found.

## 2020-08-14 NOTE — TOC Progression Note (Signed)
Transition of Care [facility]) - Progression Note    Patient Details  Name: [name] MRN: [id number] Date of Birth: Aug 03, 1961  Transition of Care [facility]) CM/SW Contact  Handy Mcloud, Juliann Pulse, RN Phone Number: 08/14/2020, 3:07 PM  Clinical Narrative:Spoke to [facility] patient's s/o-provided w/SNF bed offers-await choice,then start auth.            Expected Discharge Plan and Services                                                 Social Determinants of Health (SDOH) Interventions    Readmission Risk Interventions No flowsheet data found.

## 2020-08-15 DIAGNOSIS — R6510 Systemic inflammatory response syndrome (SIRS) of non-infectious origin without acute organ dysfunction: Secondary | ICD-10-CM | POA: Diagnosis not present

## 2020-08-15 LAB — GLUCOSE, CAPILLARY
Glucose-Capillary: 138 mg/dL — ABNORMAL HIGH (ref 70–99)
Glucose-Capillary: 111 mg/dL — ABNORMAL HIGH (ref 70–99)
Glucose-Capillary: 87 mg/dL (ref 70–99)
Glucose-Capillary: 63 mg/dL — ABNORMAL LOW (ref 70–99)
Glucose-Capillary: 109 mg/dL — ABNORMAL HIGH (ref 70–99)

## 2020-08-15 LAB — CBC WITH DIFFERENTIAL/PLATELET
Abs Immature Granulocytes: 0.76 10*3/uL — ABNORMAL HIGH (ref 0.00–0.07)
Basophils Absolute: 0 10*3/uL (ref 0.0–0.1)
Basophils Relative: 0 %
Eosinophils Absolute: 0 10*3/uL (ref 0.0–0.5)
Eosinophils Relative: 0 %
HCT: 29.6 % — ABNORMAL LOW (ref 39.0–52.0)
Hemoglobin: 9.1 g/dL — ABNORMAL LOW (ref 13.0–17.0)
Immature Granulocytes: 7 %
Lymphocytes Relative: 18 %
Lymphs Abs: 2 10*3/uL (ref 0.7–4.0)
MCH: 24.1 pg — ABNORMAL LOW (ref 26.0–34.0)
MCHC: 30.7 g/dL (ref 30.0–36.0)
MCV: 78.5 fL — ABNORMAL LOW (ref 80.0–100.0)
Monocytes Absolute: 0.7 10*3/uL (ref 0.1–1.0)
Monocytes Relative: 7 %
Neutro Abs: 7.2 10*3/uL (ref 1.7–7.7)
Neutrophils Relative %: 68 %
Platelets: 449 10*3/uL — ABNORMAL HIGH (ref 150–400)
RBC: 3.77 MIL/uL — ABNORMAL LOW (ref 4.22–5.81)
RDW: 20.8 % — ABNORMAL HIGH (ref 11.5–15.5)
WBC: 10.7 10*3/uL — ABNORMAL HIGH (ref 4.0–10.5)
nRBC: 1.3 % — ABNORMAL HIGH (ref 0.0–0.2)

## 2020-08-15 LAB — APTT
aPTT: 93 seconds — ABNORMAL HIGH (ref 24–36)
aPTT: 90 seconds — ABNORMAL HIGH (ref 24–36)

## 2020-08-15 LAB — COMPREHENSIVE METABOLIC PANEL
ALT: 52 U/L — ABNORMAL HIGH (ref 0–44)
AST: 31 U/L (ref 15–41)
Albumin: 2.6 g/dL — ABNORMAL LOW (ref 3.5–5.0)
Alkaline Phosphatase: 88 U/L (ref 38–126)
Anion gap: 9 (ref 5–15)
BUN: 30 mg/dL — ABNORMAL HIGH (ref 6–20)
CO2: 21 mmol/L — ABNORMAL LOW (ref 22–32)
Calcium: 8.5 mg/dL — ABNORMAL LOW (ref 8.9–10.3)
Chloride: 104 mmol/L (ref 98–111)
Creatinine, Ser: 1.08 mg/dL (ref 0.61–1.24)
GFR calc Af Amer: >60 mL/min (ref >60)
GFR calc non Af Amer: >60 mL/min (ref >60)
Glucose, Bld: 135 mg/dL — ABNORMAL HIGH (ref 70–99)
Potassium: 3 mmol/L — ABNORMAL LOW (ref 3.5–5.1)
Sodium: 134 mmol/L — ABNORMAL LOW (ref 135–145)
Total Bilirubin: 0.5 mg/dL (ref 0.3–1.2)
Total Protein: 6.6 g/dL (ref 6.5–8.1)

## 2020-08-15 LAB — CULTURE, BLOOD (ROUTINE X 2)
Culture: NO GROWTH
Special Requests: ADEQUATE

## 2020-08-15 LAB — HEPARIN LEVEL (UNFRACTIONATED): Heparin Unfractionated: 1.94 IU/mL — ABNORMAL HIGH (ref 0.30–0.70)

## 2020-08-15 LAB — MAGNESIUM: Magnesium: 1.9 mg/dL (ref 1.7–2.4)

## 2020-08-15 MED ORDER — SODIUM CHLORIDE 0.9 % IV SOLN: INTRAVENOUS | Status: DC

## 2020-08-15 MED ORDER — POTASSIUM CHLORIDE IN NACL 40-0.9 MEQ/L-% IV SOLN
INTRAVENOUS | Status: DC
  Filled 2020-08-15 (×8): qty 1000

## 2020-08-15 MED ORDER — POTASSIUM CHLORIDE 10 MEQ/100ML IV SOLN
10.0000 meq | INTRAVENOUS | Status: AC
  Administered 2020-08-15 (×3): 10 meq via INTRAVENOUS
  Filled 2020-08-15 (×3): qty 100

## 2020-08-15 MED ORDER — CEFAZOLIN SODIUM-DEXTROSE 1-4 GM/50ML-% IV SOLN
1.0000 g | Freq: Three times a day (TID) | INTRAVENOUS | Status: AC
  Administered 2020-08-15 – 2020-08-19 (×14): 1 g via INTRAVENOUS
  Filled 2020-08-15 (×14): qty 50

## 2020-08-15 NOTE — Care Management Important Message (Signed)
Important Message  Patient Details IM Letter given to the Patient Name: [name] MRN: [id number] Date of Birth: September 18, 1961   Medicare Important Message Given:  Yes     Kerin Salen 08/15/2020, 11:12 AM

## 2020-08-15 NOTE — Progress Notes (Signed)
Subjective: No complaints.  He is passing flatus and some stool.  Objective: Vital signs in last 24 hours: Temp:  [97.3 F (36.3 C)-97.9 F (36.6 C)] 97.3 F (36.3 C) (09/28 1202) Pulse Rate:  [74-105] 77 (09/28 1202) Resp:  [16-20] 16 (09/28 1202) BP: (123-158)/(84-108) 123/84 (09/28 1202) SpO2:  [96 %-98 %] 98 % (09/28 1202) Last BM Date: 08/14/20  Intake/Output from previous day: 09/27 0701 - 09/28 0700 In: 843.4 [P.O.:300; I.V.:443.3; IV Piggyback:100.1] Out: 1050 [Urine:1050] Intake/Output this shift: Total I/O In: 980.3 [I.V.:930.3; IV Piggyback:50] Out: -   General appearance: no distress GI: distended, firm, tympanic, scan bowel sounds  Lab Results: Recent Labs    08/13/20 0557 08/14/20 0501 08/15/20 0545  WBC 10.8* 9.4 10.7*  HGB 8.4* 8.2* 9.1*  HCT 26.3* 26.6* 29.6*  PLT 387 380 449*   BMET Recent Labs    08/13/20 0557 08/14/20 0501 08/15/20 0545  NA 143 144 134*  K 2.7* 3.7 3.0*  CL 106 108 104  CO2 23 22 21*  GLUCOSE 135* 146* 135*  BUN 22* 29* 30*  CREATININE 1.14 1.06 1.08  CALCIUM 8.7* 8.9 8.5*   LFT Recent Labs    08/15/20 0545  PROT 6.6  ALBUMIN 2.6*  AST 31  ALT 52*  ALKPHOS 88  BILITOT 0.5   PT/INR No results for input(s): LABPROT, INR in the last 72 hours. Hepatitis Panel No results for input(s): HEPBSAG, HCVAB, HEPAIGM, HEPBIGM in the last 72 hours. C-Diff No results for input(s): CDIFFTOX in the last 72 hours. Fecal Lactopherrin No results for input(s): FECLLACTOFRN in the last 72 hours.  Studies/Results: CT ABDOMEN PELVIS W CONTRAST  Result Date: 08/14/2020 CLINICAL DATA:  Provided history of: Bowel obstruction suspected (Ped 5-18y) Radiologic records states abdominal pain and distension. EXAM: CT ABDOMEN AND PELVIS WITH CONTRAST TECHNIQUE: Multidetector CT imaging of the abdomen and pelvis was performed using the standard protocol following bolus administration of intravenous contrast. CONTRAST:  1107mL OMNIPAQUE  IOHEXOL 300 MG/ML  SOLN COMPARISON:  Abdominal radiograph earlier today. FINDINGS: Lower chest: Mild cardiomegaly. Medial left lower lobe atelectasis. Trace left pleural effusions/thickening. Hepatobiliary: Diffusely decreased hepatic density consistent with steatosis. No focal hepatic lesion. High-density contents in the gallbladder likely representing sludge. There is no pericholecystic inflammation. No biliary dilatation. There is no portal venous gas. Pancreas: Fatty atrophy.  No ductal dilatation or inflammation. Spleen: Normal in size without focal abnormality. Adrenals/Urinary Tract: Mild left adrenal thickening. Normal right adrenal gland. No dominant adrenal nodule. Bilateral renal parenchymal atrophy with lobular contours. No hydronephrosis. There is symmetric bilateral perinephric edema. Cortical low densities within both kidneys are too small to accurately characterize but likely cysts. There is excretion from both kidneys on delayed phase imaging. Urinary bladder is only minimally distended. Mild bladder wall thickening. Stomach/Bowel: There is diffuse gaseous and fluid distension of the entire colon. Greatest colonic distension in the sigmoid measures 8.3 cm. There is air in the bowel wall involving the ascending and hepatic flexure consistent with pneumatosis. No associated colonic wall thickening. Occasional pericolonic edema involving the ascending colon as well as sigmoid colon. The sigmoid colon is tortuous. There is no evidence of focal bowel mass or cause for obstruction. There is no small bowel obstruction, enteric contrast reaches the colon. No small bowel inflammation. No small bowel pneumatosis. Normal appendix. Ingested material within the stomach. Normal positioning of the duodenum and ligament of Treitz. Vascular/Lymphatic: Normal caliber abdominal aorta. Mild aorto bi-iliac atherosclerosis. The celiac and SMA arteries are patent  without evidence of severe stenosis or bowel disease. There  is no mesenteric venous or portal venous gas. The portal vein is patent mesenteric veins are patent. There is no evidence of mesenteric venous thrombus. No abdominopelvic adenopathy. Reproductive: Prostate is unremarkable. Other: No free air or pneumoperitoneum. There is trace free fluid in the pericolic gutters without significant ascites. No abscess. No evidence of bowel perforation. Patchy edema in the subcutaneous tissues of the flanks, right greater than left. Subcutaneous densities in the lower anterior abdominal wall typical of medication injection sites. There is a tiny fat containing umbilical hernia. Musculoskeletal: There are no acute or suspicious osseous abnormalities. IMPRESSION: 1. Diffuse gaseous and fluid distension of the entire colon suggesting colonic ileus. Pneumatosis of the ascending and hepatic flexure of the colon. No associated colonic wall thickening or portal venous gas. This can be seen in the setting of bowel ischemia, however there are no secondary findings of bowel inflammation nor supporting mesenteric vascular findings. Benign causes of pneumatosis have also been reported. Recommend surgical consult. 2. Normal small bowel without obstruction. 3. Hepatic steatosis. 4. Bilateral renal parenchymal atrophy with lobular contours. 5. Mild bladder wall thickening, can be seen with urinary tract infection. Aortic Atherosclerosis (ICD10-I70.0). These results were called by telephone at the time of interpretation on 08/14/2020 at 4:04 pm to provider [facility] , who verbally acknowledged these results. Electronically Signed   By: Keith Rake M.D.   On: 08/14/2020 16:04   DG Abd 2 Views  Result Date: 08/14/2020 CLINICAL DATA:  Abdominal pain and distension EXAM: X-RAY ABDOMEN 2 VIEWS COMPARISON:  None. FINDINGS: Supine and lateral decubitus images obtained. There is generalized bowel dilatation with multiple air-fluid levels. No free air appreciable. No abnormal  calcifications. IMPRESSION: Diffuse bowel dilatation with multiple air-fluid levels. Question severe ileus versus a degree of distal bowel obstruction. No free air appreciable. These results will be called to the ordering clinician or representative by the Radiologist Assistant, and communication documented in the PACS or [employer]. Electronically Signed   By: Lowella Grip III M.D.   On: 08/14/2020 08:42    Medications:  Scheduled: . insulin glargine  15 Units Subcutaneous BID  . metoCLOPramide (REGLAN) injection  5 mg Intravenous Q8H  . metoprolol tartrate  5 mg Intravenous Q6H   Continuous: . 0.9 % NaCl with KCl 40 mEq / L 100 mL/hr at 08/15/20 0823  .  ceFAZolin (ANCEF) IV 1 g (08/15/20 1557)  . heparin 1,000 Units/hr (08/14/20 2209)    Assessment/Plan: 1) Ileus. 2) Pneumatosis - appears benign. 3) Hypokalemia.   Nursing reports that he does turn himself from side to side and he was able to sit up for two hours today.  This is helping him to pass some flatus and 2-3 loose bowel movements.  There is no evidence of an acute abdomen and his pneumatosis does not indicate a diseased colon at this time.  He is hypokalemic and it is being repleted.  Plan: 1) Continue with mobility. 2) Continue to replete potassium.  LOS: 3 days   Lahela Woodin D 08/15/2020, 6:12 PM

## 2020-08-15 NOTE — Progress Notes (Signed)
PROGRESS NOTE    [name]  IFO:[id number] DOB: [birth year] DOA: 08/10/2020 PCP: Charlott Rakes, MD  Brief Narrative:  59 year old black male with known history of IDDM since 2012, HTN, chronic gout Prior scrotal abscess Rx 2017 drained at that time Severe onychomycosis Severe tricompartmental osteoarthritis both knees He is status post bilateral arthroscopies with meniscectomies by Dr. Berenice Primas in 2017 He has had work-up in the past found to be ANA negative RF negative CCP negative and felt to be a combination of his chronic gout and possible osteoarthritis He was recently hospitalized 8/11 through 8/24 with AKI found at that time to have left upper extremity DVT in addition to polyarticular gout He also came in with difficulty with urination found to have SIRS criteria with fever 102.8-CT chest no embolism no pneumonia urine analysis showed negative leukocytes negative nitrites However urine culture grew E. coli He has had intractable pain in upper extremities arms and swelling of his joints and he does not remember ever getting tapped on his joints but on review of chart as above it looks that he has been thoroughly worked up in the past for other rheumatological disease process and was supposed to follow-up with Dr. Estanislado Pandy rheumatology in the outpatient setting-it is unclear if this was ever done    Assessment & Plan:   Principal Problem:   SIRS (systemic inflammatory response syndrome) ([place]) Active Problems:   Essential hypertension   Gout   Iron deficiency anemia   Elevated troponin   Generalized weakness   Uncontrolled type 2 diabetes mellitus with hyperglycemia, without long-term current use of insulin ([place])   Acute deep vein thrombosis (DVT) of brachial vein of left upper extremity ([place])   1. Polyarticular gout with osteoarthritis a. Previous work-up with rheumatoid factor and ANA were all [-] b. Holding steroids at this time c. Discontinued allopurinol on  admission-have had to hold colchicine d. Therapy s recommending skilled placement 2. 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 [place] x-ray 9/27?  SBO-CT scan confirms pneumatosis b. Continue Reglan at this time IV every 8 c. Discussed with general surgery would keep n.p.o. for the time being 3. Insulin-dependent diabetes mellitus recent M7E 7.1 with complication of neuropathy nephropathy a. CBG ranging from 138 164 controlled on 15 units of Lantus twice daily  b. Home meds 70/30 insulin 50 twice daily c. Change Reglan to IV 4. DVT 06/2020 a. On apixaban twice daily for now-transition to heparin for now given n.p.o. 5.  HTN a. Placed on IV Toprol scheduled as cannot take p.o. b. May need to add other agents  6. Iron deficiency anemia  a. Given IV iron 9/25 secondary to low iron levels b. Outpatient screening for GI blood loss c. Resume iron once bowel obstruction clear 7. Prior bilateral knee surgeries a. Immobile it seems and has severe onychogryphosis as below b. Will need skilled placement 8. Severe onychogryphosis 9. Underlying CKD ii 10. Moderate to severe hypokalemia a. replace with IVF NS + KCL 55meq @100  ml/hr 11. possible urinary infection a. lture is growing over 100,000 CFU was on Keflex transition to ceftriaxone b. Mild white count elevation  DVT prophylaxis: Lovenox Code Status: Full Family Communication: Talked to his sister in law [name] [phone] and updated fully Disposition:   Status is: Observation  The patient will require care spanning > 2 midnights and should be moved to inpatient because: Hemodynamically unstable, Ongoing active pain requiring inpatient pain management, Unsafe d/c plan, IV treatments appropriate due to intensity of  illness or inability to take PO and Inpatient level of care appropriate due to severity of illness  Dispo:  Patient From: Home  Planned Disposition: Other  Expected discharge  date:unclear  Medically stable for discharge: No   Consultants:   None  Procedures: None  Antimicrobials: Keflex started 9/25   Subjective:  Still very distended Large stool reported yesterday None today + flatus No n/v  Objective: Vitals:   08/14/20 1421 08/14/20 2033 08/14/20 2359 08/15/20 0431  BP: 105/69 (!) 156/108 (!) 144/106 (!) 150/99  Pulse:  75 74 79  Resp:  18 20 17   Temp:  97.6 F (36.4 C) 97.9 F (36.6 C) 97.6 F (36.4 C)  TempSrc:  Oral  Oral  SpO2:  96% 96% 97%  Weight:      Height:        Intake/Output Summary (Last 24 hours) at 08/15/2020 0948 Last data filed at 08/15/2020 0500 Gross per 24 hour  Intake 843.38 ml  Output 1050 ml  Net -206.62 ml   Filed Weights   08/11/20 0321  Weight: 106.2 kg    Examination:  Awake coherent tired appearing Chest clear no added sound Abdomen quite distended tympanic no rebound no gaurd 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 EXAM: CT ABDOMEN AND PELVIS WITH CONTRAST TECHNIQUE: Multidetector CT imaging of the abdomen and pelvis was performed using the standard protocol following bolus administration of intravenous contrast. CONTRAST:  139mL OMNIPAQUE IOHEXOL 300 MG/ML  SOLN COMPARISON:  Abdominal radiograph earlier today. FINDINGS: Lower chest: Mild cardiomegaly. Medial left lower lobe atelectasis. Trace left pleural effusions/thickening. Hepatobiliary: Diffusely decreased hepatic  density consistent with steatosis. No focal hepatic lesion. High-density contents in the gallbladder likely representing sludge. There is no pericholecystic inflammation. No biliary dilatation. There is no portal venous gas. Pancreas: Fatty atrophy.  No ductal dilatation or inflammation. Spleen: Normal in size without focal abnormality. Adrenals/Urinary Tract: Mild left adrenal thickening. Normal right adrenal gland. No dominant adrenal nodule. Bilateral renal parenchymal atrophy with lobular contours. No hydronephrosis. There is symmetric bilateral perinephric edema. Cortical low densities within both kidneys are too small to accurately characterize but likely cysts. There is excretion from both kidneys on delayed phase imaging. Urinary bladder is only minimally distended. Mild bladder wall thickening. Stomach/Bowel: There is diffuse gaseous and fluid distension of the entire colon. Greatest colonic distension in the sigmoid measures 8.3 cm. There is air in the bowel wall involving the ascending and hepatic flexure consistent with pneumatosis. No associated colonic wall thickening. Occasional pericolonic edema involving the ascending colon as well as sigmoid colon. The sigmoid colon is tortuous. There is no evidence of focal bowel mass or cause for obstruction. There is no small bowel obstruction, enteric contrast reaches the colon. No small bowel inflammation. No small bowel pneumatosis. Normal appendix. Ingested material within the stomach. Normal positioning of the duodenum and ligament of Treitz. Vascular/Lymphatic: Normal caliber abdominal aorta. Mild aorto bi-iliac atherosclerosis. The celiac and SMA arteries are patent without evidence of severe stenosis or bowel disease. There is no mesenteric venous or portal venous gas. The  portal vein is patent mesenteric veins are patent. There is no evidence of mesenteric venous thrombus. No abdominopelvic adenopathy. Reproductive: Prostate is unremarkable. Other: No  free air or pneumoperitoneum. There is trace free fluid in the pericolic gutters without significant ascites. No abscess. No evidence of bowel perforation. Patchy edema in the subcutaneous tissues of the flanks, right greater than left. Subcutaneous densities in the lower anterior abdominal wall typical of medication injection sites. There is a tiny fat containing umbilical hernia. Musculoskeletal: There are no acute or suspicious osseous abnormalities. IMPRESSION: 1. Diffuse gaseous and fluid distension of the entire colon suggesting colonic ileus. Pneumatosis of the ascending and hepatic flexure of the colon. No associated colonic wall thickening or portal venous gas. This can be seen in the setting of bowel ischemia, however there are no secondary findings of bowel inflammation nor supporting mesenteric vascular findings. Benign causes of pneumatosis have also been reported. Recommend surgical consult. 2. Normal small bowel without obstruction. 3. Hepatic steatosis. 4. Bilateral renal parenchymal atrophy with lobular contours. 5. Mild bladder wall thickening, can be seen with urinary tract infection. Aortic Atherosclerosis (ICD10-I70.0). These results were called by telephone at the time of interpretation on 08/14/2020 at 4:04 pm to provider [facility] , who verbally acknowledged these results. Electronically Signed   By: Keith Rake M.D.   On: 08/14/2020 16:04   DG Abd 2 Views  Result Date: 08/14/2020 CLINICAL DATA:  Abdominal pain and distension EXAM: X-RAY ABDOMEN 2 VIEWS COMPARISON:  None. FINDINGS: Supine and lateral decubitus images obtained. There is generalized bowel dilatation with multiple air-fluid levels. No free air appreciable. No abnormal calcifications. IMPRESSION: Diffuse bowel dilatation with multiple air-fluid levels. Question severe ileus versus a degree of distal bowel obstruction. No free air appreciable. These results will be called to the ordering clinician or  representative by the Radiologist Assistant, and communication documented in the PACS or [employer]. Electronically Signed   By: Lowella Grip III M.D.   On: 08/14/2020 08:42     Scheduled Meds:  insulin glargine  15 Units Subcutaneous BID   metoCLOPramide (REGLAN) injection  5 mg Intravenous Q8H   metoprolol tartrate  5 mg Intravenous Q6H   Continuous Infusions:  0.9 % NaCl with KCl 40 mEq / L 100 mL/hr at 08/15/20 3762   cefTRIAXone (ROCEPHIN)  IV 1 g (08/14/20 1726)   heparin 1,000 Units/hr (08/14/20 2209)   potassium chloride 10 mEq (08/15/20 0836)     LOS: 3 days    Time spent: 50 including care coordination time of GI and general surgery   Nita Sells, MD [facility] To contact the attending provider between 7A-7P or the covering provider during after hours 7P-7A, please log into the web site [email or web address] and access using universal [place] password for that web site. If you do not have the password, please call the hospital operator.  08/15/2020, 9:48 AM

## 2020-08-15 NOTE — Progress Notes (Signed)
[place] for IV heparin  Indication: history of DVT  Allergies  Allergen Reactions  . Hctz [Hydrochlorothiazide]     Hx of gout; uric acid 14.1 on 03/13/20 Acute gout L elbow 8/11-8/24/21    Patient Measurements: Height: 5\' 5"  (165.1 cm) Weight: 106.2 kg (234 lb 2.1 oz) IBW/kg (Calculated) : 61.5 Heparin Dosing Weight: 85.7 kg  Vital Signs: Temp: 97.3 F (36.3 C) (09/28 1202) Temp Source: Oral (09/28 1202) BP: 123/84 (09/28 1202) Pulse Rate: 77 (09/28 1202)  Labs: Recent Labs    08/13/20 0557 08/13/20 0557 08/14/20 0501 08/14/20 2009 08/15/20 0545 08/15/20 1217  HGB 8.4*   < > 8.2*  --  9.1*  --   HCT 26.3*  --  26.6*  --  29.6*  --   PLT 387  --  380  --  449*  --   APTT  --   --   --  39* 93* 90*  HEPARINUNFRC  --   --   --  >2.20* 1.94*  --   CREATININE 1.14  --  1.06  --  1.08  --    < > = values in this interval not displayed.    Estimated Creatinine Clearance: 81.7 mL/min (by C-G formula based on SCr of 1.08 mg/dL).   Assessment: 66 y/oM with PMH of DVT (07/03/2020) on Apixaban PTA admitted on 08/10/2020 with SIRS. Given worsening abdominal distention, CT abd pelvis done today, which revealed a colonic ileus with pneumatosis of the ascending and hepatic flexure of the colon. PO meds held. Pharmacy consulted for IV heparin dosing while Apixaban on hold. Last dose of Apixaban 5mg  PO given this morning at 0921. Note that recent Apixaban use may falsely elevate heparin levels.   Baseline labs: Hg low at 8.2, pltc WNL, Heparin level >2.2, INR 1.5, aPTT 39 se, Scr 1.08  Today, 08/15/2020:  APTT remains therapeutic on 1000 units/hr  Hg low but slightly improved, pltc increased  No bleeding or infusion related concerns reported by RN  Goal of Therapy:  aPTT 66-102 seconds Heparin level 0.3-0.7 units/ml Monitor platelets by anticoagulation protocol: Yes   Plan:   Continue heparin infusion at 1000 units/hr   Daily CBC,  aPTT, and heparin level until DOAC effects on HL have dissipated  Monitor closely for s/sx of bleeding, ability to resume [place], PharmD, BCPS [phone] 08/15/2020, 1:31 PM

## 2020-08-15 NOTE — Progress Notes (Signed)
[place] for IV heparin  Indication: history of DVT  Allergies  Allergen Reactions  . Hctz [Hydrochlorothiazide]     Hx of gout; uric acid 14.1 on 03/13/20 Acute gout L elbow 8/11-8/24/21    Patient Measurements: Height: 5\' 5"  (165.1 cm) Weight: 106.2 kg (234 lb 2.1 oz) IBW/kg (Calculated) : 61.5 Heparin Dosing Weight: 85.7 kg  Vital Signs: Temp: 97.6 F (36.4 C) (09/28 0431) Temp Source: Oral (09/28 0431) BP: 150/99 (09/28 0431) Pulse Rate: 79 (09/28 0431)  Labs: Recent Labs    08/13/20 0557 08/13/20 0557 08/14/20 0501 08/14/20 2009 08/15/20 0545  HGB 8.4*   < > 8.2*  --  9.1*  HCT 26.3*  --  26.6*  --  29.6*  PLT 387  --  380  --  449*  APTT  --   --   --  39* 93*  HEPARINUNFRC  --   --   --  >2.20*  --   CREATININE 1.14  --  1.06  --  1.08   < > = values in this interval not displayed.    Estimated Creatinine Clearance: 81.7 mL/min (by C-G formula based on SCr of 1.08 mg/dL).   Medical History: Past Medical History:  Diagnosis Date  . Gouty bursitis, elbow  10/18/2015  . Hyperlipidemia   . Hypertension   . Knee pain, chronic   . Type II diabetes mellitus ([place])     Assessment: 14 y/oM with PMH of DVT (07/03/2020) on Apixaban PTA admitted on 08/10/2020 with SIRS. Given worsening abdominal distention, CT abd pelvis done today, which revealed a colonic ileus with pneumatosis of the ascending and hepatic flexure of the colon. PO meds held. Pharmacy consulted for IV heparin dosing while Apixaban on hold. Last dose of Apixaban 5mg  PO given this morning at 0921. Note that recent Apixaban use may falsely elevate heparin levels.   Baseline labs: Hg low at 8.2, pltc WNL, Heparin level >2.2, INR 1.5, aPTT 39 se, Scr 1.08  08/15/2020:  APTT 93sec-  therapeutic on 1000 units/hr  Hg low but slightly improved, pltc increased 449  No bleeding or infusion related concerns reported by RN  Goal of Therapy:  aPTT 66-102  seconds Heparin level 0.3-0.7 units/ml Monitor platelets by anticoagulation protocol: Yes   Plan:   Continue heparin infusion at 1000 units/hr   Repeat aPTT in 6 hours    Will use aPTT for dose titration until heparin level and aPTT correlate.  Daily aPTT, heparin level, CBC  Monitor closely for s/sx of bleeding   Netta Cedars, PharmD, BCPS Clinical Pharmacist  08/15/2020,6:35 AM

## 2020-08-15 NOTE — Progress Notes (Signed)
Physical Therapy Treatment Patient Details Name: [name] MRN: [id number] DOB: 02/20/1961 Today's Date: 08/15/2020    History of Present Illness 59 yo male admitted to [facility] on 9/24 with weakness, joint pain, difficulty urinating. Pt meeting SIRS criteria, possibly secondary to urinary infection. PMHx significant for chronic gouty arthritis of multiple joints, CKD II, IDDM, HTN, HLD, and chronic anemia. Admitted to [facility] 8/11-8/24 for AKI, LUE DVT, and joint pain, d/c SNF.    PT Comments    Pt progressing toward goals, agreeable to OOB with encouragement. Requiring decr overall assist today, able to stand x2, WB through LEs and transfer to chair with use of stedy.  Follow Up Recommendations  SNF;Supervision/Assistance - 24 hour     Equipment Recommendations  None recommended by PT    Recommendations for Other Services       Precautions / Restrictions Precautions Precautions: Fall Restrictions Weight Bearing Restrictions: No    Mobility  Bed Mobility Overal bed mobility: Needs Assistance Bed Mobility: Rolling;Sidelying to Sit Rolling: Min assist Sidelying to sit: Min assist Supine to sit: Min assist     General bed mobility comments: cues to self assist, assist to bring trunk to upright  Transfers Overall transfer level: Needs assistance Equipment used: Ambulation equipment used Transfers: Sit to/from Stand Sit to Stand: Min assist;+2 physical assistance;+2 safety/equipment;From elevated surface         General transfer comment: assist for anterior-superior wt shift, cues for hand placement and self assist   Ambulation/Gait             General Gait Details: NT    Stairs             Wheelchair Mobility    Modified Rankin (Stroke Patients Only)       Balance   Sitting-balance support: Single extremity supported Sitting balance-Leahy Scale: Fair Sitting balance - Comments: sits EOB statically well, unable to wt shift to don socks        Standing balance comment: Poor                             Cognition Arousal/Alertness: Awake/alert Behavior During Therapy: Flat affect Overall Cognitive Status: Impaired/Different from baseline Area of Impairment: Following commands                       Following Commands: Follows one step commands with increased time;Follows multi-step commands with increased time       General Comments: requires encouragement       Exercises General Exercises - Lower Extremity Ankle Circles/Pumps: AROM;Both;10 reps;Seated Long Arc Quad: AROM;Both;10 reps;Seated;Limitations Long [employer] Limitations: ROM limited by swelling and pain    General Comments        Pertinent Vitals/Pain Pain Assessment: Faces Faces Pain Scale: Hurts whole lot Pain Location: abd  Pain Descriptors / Indicators: Sore;Sharp Pain Intervention(s): Limited activity within patient's tolerance;Monitored during session    Home Living                      Prior Function            PT Goals (current goals can now be found in the care plan section) Acute Rehab PT Goals Patient Stated Goal: get stronger PT Goal Formulation: With patient Time For Goal Achievement: 08/26/20 Potential to Achieve Goals: Fair Progress towards PT goals: Progressing toward goals    Frequency    Min 2X/week  PT Plan Current plan remains appropriate    Co-evaluation              AM-PAC PT "6 Clicks" Mobility   Outcome Measure  Help needed turning from your back to your side while in a flat bed without using bedrails?: A Little Help needed moving from lying on your back to sitting on the side of a flat bed without using bedrails?: A Little Help needed moving to and from a bed to a chair (including a wheelchair)?: A Lot Help needed standing up from a chair using your arms (e.g., wheelchair or bedside chair)?: A Lot Help needed to walk in hospital room?: Total Help needed climbing 3-5  steps with a railing? : Total 6 Click Score: 12    End of Session   Activity Tolerance: Patient tolerated treatment well Patient left: in chair;with call bell/phone within reach;with chair alarm set Nurse Communication: Need for lift equipment PT Visit Diagnosis: Muscle weakness (generalized) (M62.81);Other abnormalities of gait and mobility (R26.89)     Time: 9692-4932 PT Time Calculation (min) (ACUTE ONLY): 16 min  Charges:  $Therapeutic Activity: 8-22 mins                     Baxter Flattery, PT  Acute Rehab Dept ([place]) [phone] Pager [phone]  08/15/2020    [facility] 08/15/2020, 1:49 PM

## 2020-08-15 NOTE — Progress Notes (Signed)
° ° °  CC: Abdominal distention  Subjective: Patient still massively distended.  He is nonmobile.  He is having bowel movements 2 yesterday and 1 so far today. Objective: Vital signs in last 24 hours: Temp:  [97.6 F (36.4 C)-97.9 F (36.6 C)] 97.6 F (36.4 C) (09/28 0431) Pulse Rate:  [70-79] 79 (09/28 0431) Resp:  [17-21] 17 (09/28 0431) BP: (105-159)/(69-114) 150/99 (09/28 0431) SpO2:  [96 %-97 %] 97 % (09/28 0431) Last BM Date: 08/14/20 300 p.o. 543 IV 1050 urine BM x2 yesterday and 1 this a.m. Afebrile vital signs are stable  blood pressure is elevated with diastolic in the 80-165 range. Potassium 3.0 magnesium 1.9 Intake/Output from previous day: 09/27 0701 - 09/28 0700 In: 843.4 [P.O.:300; I.V.:443.3; IV Piggyback:100.1] Out: 1050 [Urine:1050] Intake/Output this shift: No intake/output data recorded.  General appearance: alert, cooperative and no distress GI: Massively distended abdomen, having bowel movements but remains distended.  No peritonitis.  Lab Results:  Recent Labs    08/14/20 0501 08/15/20 0545  WBC 9.4 10.7*  HGB 8.2* 9.1*  HCT 26.6* 29.6*  PLT 380 449*    BMET Recent Labs    08/14/20 0501 08/15/20 0545  NA 144 134*  K 3.7 3.0*  CL 108 104  CO2 22 21*  GLUCOSE 146* 135*  BUN 29* 30*  CREATININE 1.06 1.08  CALCIUM 8.9 8.5*   PT/INR No results for input(s): LABPROT, INR in the last 72 hours.  Recent Labs  Lab 08/11/20 0248 08/12/20 0557 08/13/20 0557 08/14/20 0501 08/15/20 0545  AST 43* 72* 83* 50* 31  ALT 34 50* 67* 60* 52*  ALKPHOS 91 92 97 91 88  BILITOT 1.3* 0.9 0.5 0.5 0.5  PROT 6.4* 6.5 6.6 6.5 6.6  ALBUMIN 2.4* 2.4* 2.3* 2.5* 2.6*     Lipase     Component Value Date/Time   LIPASE 153 (H) 06/14/2011 2309     Medications:  insulin glargine  15 Units Subcutaneous BID   metoCLOPramide (REGLAN) injection  5 mg Intravenous Q8H   metoprolol tartrate  5 mg Intravenous Q6H    0.9 % NaCl with KCl 40 mEq / L 100  mL/hr at 08/15/20 0823   cefTRIAXone (ROCEPHIN)  IV 1 g (08/14/20 1726)   heparin 1,000 Units/hr (08/14/20 2209)   potassium chloride 10 mEq (08/15/20 1010)    Assessment/Plan Gout Osteoarthritis Type 2 diabetes  Left upper extremity DVT on anticoagulation Anemia Severe hypokalemia Hypomagnesemia  -Replete potassium up to the 4.0 range.   Colonic ileus with 8.3 cm sigmoid distention  FEN: IV fluids/n.p.o. ID: Keflex 9/25-9/27; ceftriaxone x1 9/27 DVT: Heparin drip Follow-up: TBD  Plan: Currently he does not have any acute peritonitis.  Aside from his massively distended abdomen he does not have any tenderness or pain.  At this time there is no surgical remedy for this.  Should he become sicker and develop per ischemia or perforate he would require surgery.  Will defer to medicine and Dr. Collene Mares at this point.      LOS: 3 days    Bonny Vanleeuwen 08/15/2020 Please see Amion

## 2020-08-16 ENCOUNTER — Inpatient Hospital Stay (HOSPITAL_COMMUNITY): Payer: Medicare HMO

## 2020-08-16 DIAGNOSIS — N182 Chronic kidney disease, stage 2 (mild): Secondary | ICD-10-CM

## 2020-08-16 DIAGNOSIS — K567 Ileus, unspecified: Secondary | ICD-10-CM

## 2020-08-16 DIAGNOSIS — M1000 Idiopathic gout, unspecified site: Secondary | ICD-10-CM

## 2020-08-16 DIAGNOSIS — E876 Hypokalemia: Secondary | ICD-10-CM

## 2020-08-16 DIAGNOSIS — M10041 Idiopathic gout, right hand: Secondary | ICD-10-CM

## 2020-08-16 DIAGNOSIS — I82622 Acute embolism and thrombosis of deep veins of left upper extremity: Secondary | ICD-10-CM | POA: Diagnosis not present

## 2020-08-16 DIAGNOSIS — M109 Gout, unspecified: Secondary | ICD-10-CM

## 2020-08-16 DIAGNOSIS — D509 Iron deficiency anemia, unspecified: Secondary | ICD-10-CM | POA: Diagnosis not present

## 2020-08-16 LAB — CBC
HCT: 29.8 % — ABNORMAL LOW (ref 39.0–52.0)
Hemoglobin: 9.2 g/dL — ABNORMAL LOW (ref 13.0–17.0)
MCH: 24.1 pg — ABNORMAL LOW (ref 26.0–34.0)
MCHC: 30.9 g/dL (ref 30.0–36.0)
MCV: 78.2 fL — ABNORMAL LOW (ref 80.0–100.0)
Platelets: 513 10*3/uL — ABNORMAL HIGH (ref 150–400)
RBC: 3.81 MIL/uL — ABNORMAL LOW (ref 4.22–5.81)
RDW: 21.2 % — ABNORMAL HIGH (ref 11.5–15.5)
WBC: 10.3 10*3/uL (ref 4.0–10.5)
nRBC: 1.5 % — ABNORMAL HIGH (ref 0.0–0.2)

## 2020-08-16 LAB — COMPREHENSIVE METABOLIC PANEL
ALT: 39 U/L (ref 0–44)
AST: 22 U/L (ref 15–41)
Albumin: 2.6 g/dL — ABNORMAL LOW (ref 3.5–5.0)
Alkaline Phosphatase: 90 U/L (ref 38–126)
Anion gap: 9 (ref 5–15)
BUN: 24 mg/dL — ABNORMAL HIGH (ref 6–20)
CO2: 21 mmol/L — ABNORMAL LOW (ref 22–32)
Calcium: 8.3 mg/dL — ABNORMAL LOW (ref 8.9–10.3)
Chloride: 111 mmol/L (ref 98–111)
Creatinine, Ser: 0.91 mg/dL (ref 0.61–1.24)
GFR calc Af Amer: >60 mL/min (ref >60)
GFR calc non Af Amer: >60 mL/min (ref >60)
Glucose, Bld: 68 mg/dL — ABNORMAL LOW (ref 70–99)
Potassium: 3.6 mmol/L (ref 3.5–5.1)
Sodium: 141 mmol/L (ref 135–145)
Total Bilirubin: 0.5 mg/dL (ref 0.3–1.2)
Total Protein: 6.4 g/dL — ABNORMAL LOW (ref 6.5–8.1)

## 2020-08-16 LAB — GLUCOSE, CAPILLARY
Glucose-Capillary: 71 mg/dL (ref 70–99)
Glucose-Capillary: 64 mg/dL — ABNORMAL LOW (ref 70–99)
Glucose-Capillary: 90 mg/dL (ref 70–99)
Glucose-Capillary: 72 mg/dL (ref 70–99)
Glucose-Capillary: 69 mg/dL — ABNORMAL LOW (ref 70–99)
Glucose-Capillary: 142 mg/dL — ABNORMAL HIGH (ref 70–99)

## 2020-08-16 LAB — HEPARIN LEVEL (UNFRACTIONATED): Heparin Unfractionated: 1.3 IU/mL — ABNORMAL HIGH (ref 0.30–0.70)

## 2020-08-16 LAB — CULTURE, BLOOD (ROUTINE X 2)
Culture: NO GROWTH
Special Requests: ADEQUATE

## 2020-08-16 LAB — APTT
aPTT: 116 seconds — ABNORMAL HIGH (ref 24–36)
aPTT: 84 seconds — ABNORMAL HIGH (ref 24–36)

## 2020-08-16 MED ORDER — POTASSIUM CHLORIDE CRYS ER 20 MEQ PO TBCR
30.0000 meq | EXTENDED_RELEASE_TABLET | Freq: Two times a day (BID) | ORAL | Status: AC
  Administered 2020-08-16 (×2): 30 meq via ORAL
  Filled 2020-08-16 (×2): qty 1

## 2020-08-16 MED ORDER — INSULIN GLARGINE 100 UNIT/ML ~~LOC~~ SOLN
7.0000 [IU] | Freq: Two times a day (BID) | SUBCUTANEOUS | Status: DC
  Filled 2020-08-16: qty 0.07

## 2020-08-16 MED ORDER — HYDRALAZINE HCL 20 MG/ML IJ SOLN
10.0000 mg | Freq: Four times a day (QID) | INTRAMUSCULAR | Status: DC | PRN
  Administered 2020-08-24 – 2020-08-29 (×3): 10 mg via INTRAVENOUS
  Filled 2020-08-16 (×5): qty 1

## 2020-08-16 MED ORDER — DEXTROSE 50 % IV SOLN
12.5000 g | INTRAVENOUS | Status: AC
  Administered 2020-08-16: 12.5 g via INTRAVENOUS
  Filled 2020-08-16: qty 50

## 2020-08-16 MED ORDER — INSULIN GLARGINE 100 UNIT/ML ~~LOC~~ SOLN
7.0000 [IU] | Freq: Every day | SUBCUTANEOUS | Status: DC
  Administered 2020-08-17 – 2020-09-11 (×26): 7 [IU] via SUBCUTANEOUS
  Filled 2020-08-16 (×26): qty 0.07

## 2020-08-16 MED ORDER — DEXTROSE 50 % IV SOLN
INTRAVENOUS | Status: AC
  Administered 2020-08-16: 25 mL
  Filled 2020-08-16: qty 50

## 2020-08-16 MED ORDER — METOPROLOL TARTRATE 5 MG/5ML IV SOLN
7.5000 mg | Freq: Four times a day (QID) | INTRAVENOUS | Status: DC
  Administered 2020-08-16 – 2020-09-01 (×63): 7.5 mg via INTRAVENOUS
  Filled 2020-08-16 (×64): qty 10

## 2020-08-16 NOTE — Progress Notes (Signed)
[place] for IV heparin  Indication: history of DVT  Allergies  Allergen Reactions  . Hctz [Hydrochlorothiazide]     Hx of gout; uric acid 14.1 on 03/13/20 Acute gout L elbow 8/11-8/24/21    Patient Measurements: Height: 5\' 5"  (165.1 cm) Weight: 106.2 kg (234 lb 2.1 oz) IBW/kg (Calculated) : 61.5 Heparin Dosing Weight: 85.7 kg  Vital Signs: Temp: 97.5 F (36.4 C) (09/29 0553) Temp Source: Oral (09/29 0553) BP: 159/118 (09/29 0553) Pulse Rate: 95 (09/29 0553)  Labs: Recent Labs    08/14/20 0501 08/14/20 0501 08/14/20 2009 08/15/20 0545 08/15/20 1217 08/16/20 0505  HGB 8.2*   < >  --  9.1*  --  9.2*  HCT 26.6*  --   --  29.6*  --  29.8*  PLT 380  --   --  449*  --  513*  APTT  --    < > 39* 93* 90* 116*  HEPARINUNFRC  --   --  >2.20* 1.94*  --   --   CREATININE 1.06  --   --  1.08  --  0.91   < > = values in this interval not displayed.    Estimated Creatinine Clearance: 96.9 mL/min (by C-G formula based on SCr of 0.91 mg/dL).   Assessment: 52 y/oM with PMH of DVT (07/03/2020) on Apixaban PTA admitted on 08/10/2020 with SIRS. Given worsening abdominal distention, CT abd pelvis done today, which revealed a colonic ileus with pneumatosis of the ascending and hepatic flexure of the colon. PO meds held. Pharmacy consulted for IV heparin dosing while Apixaban on hold. Last dose of Apixaban 5mg  PO given this morning at 0921. Note that recent Apixaban use may falsely elevate heparin levels.   Baseline labs: Hg low at 8.2, pltc WNL, Heparin level >2.2, INR 1.5, aPTT 39 se, Scr 1.08  Today, 08/16/2020:  APTT 116sec- now SUPRAtherapeutic on 1000 units/hr  Heparin level 1.3- trending down but still elevated secondary to DOAC interaction  Hg low but stable, pltc increased  No bleeding or infusion related concerns reported by RN  Goal of Therapy:  aPTT 66-102 seconds Heparin level 0.3-0.7 units/ml Monitor platelets by anticoagulation  protocol: Yes   Plan:   Decrease heparin infusion to 850 units/hr   Repeat aptt in 6h after rate change  Daily CBC, aPTT, and heparin level until DOAC effects on HL have dissipated  Monitor closely for s/sx of bleeding, ability to resume Eliquis  Netta Cedars, PharmD, BCPS 08/16/2020, 6:45 AM

## 2020-08-16 NOTE — Progress Notes (Signed)
[facility] Progress Note     Subjective: Patient reports he had a large BM yesterday but has not had another BM since. +flatus. He feels like his abdomen is slightly less distended than yesterday. Some soreness in L abdomen but denies pain. Denies nausea. Reports ambulating in room yesterday.   Objective: Vital signs in last 24 hours: Temp:  [97.3 F (36.3 C)-97.9 F (36.6 C)] 97.5 F (36.4 C) (09/29 0553) Pulse Rate:  [77-105] 95 (09/29 0553) Resp:  [16-19] 19 (09/29 0553) BP: (123-169)/(84-118) 159/118 (09/29 0553) SpO2:  [97 %-98 %] 98 % (09/29 0553) Last BM Date: 08/15/20  Intake/Output from previous day: 09/28 0701 - 09/29 0700 In: 2005.2 [I.V.:1855.2; IV Piggyback:150] Out: 600 [Urine:600] Intake/Output this shift: No intake/output data recorded.  PE: General: pleasant, WD, morbidly obese male who is laying in bed in NAD Heart: regular, rate, and rhythm. Edema in BLE Lungs: CTAB, no wheezes, rhonchi, or rales noted.  Respiratory effort nonlabored Abd: NT, very distended, tinkling high-pitched BS    Lab Results:  Recent Labs    08/15/20 0545 08/16/20 0505  WBC 10.7* 10.3  HGB 9.1* 9.2*  HCT 29.6* 29.8*  PLT 449* 513*   BMET Recent Labs    08/15/20 0545 08/16/20 0505  NA 134* 141  K 3.0* 3.6  CL 104 111  CO2 21* 21*  GLUCOSE 135* 68*  BUN 30* 24*  CREATININE 1.08 0.91  CALCIUM 8.5* 8.3*   PT/INR No results for input(s): LABPROT, INR in the last 72 hours. CMP     Component Value Date/Time   NA 141 08/16/2020 0505   NA 131 (A) 07/23/2020 0000   K 3.6 08/16/2020 0505   CL 111 08/16/2020 0505   CO2 21 (L) 08/16/2020 0505   GLUCOSE 68 (L) 08/16/2020 0505   BUN 24 (H) 08/16/2020 0505   BUN 36 (A) 07/23/2020 0000   CREATININE 0.91 08/16/2020 0505   CREATININE 0.95 10/18/2016 1640   CALCIUM 8.3 (L) 08/16/2020 0505   CALCIUM 9.0 12/28/2015 0726   PROT 6.4 (L) 08/16/2020 0505   PROT 7.3 03/13/2020 1420   ALBUMIN 2.6 (L) 08/16/2020  0505   ALBUMIN 4.7 03/13/2020 1420   AST 22 08/16/2020 0505   ALT 39 08/16/2020 0505   ALKPHOS 90 08/16/2020 0505   BILITOT 0.5 08/16/2020 0505   BILITOT 0.4 03/13/2020 1420   GFRNONAA >60 08/16/2020 0505   GFRNONAA 70 10/13/2013 1054   GFRAA >60 08/16/2020 0505   GFRAA 80 10/13/2013 1054   Lipase     Component Value Date/Time   LIPASE 153 (H) 06/14/2011 2309       Studies/Results: CT ABDOMEN PELVIS W CONTRAST  Result Date: 08/14/2020 CLINICAL DATA:  Provided history of: Bowel obstruction suspected (Ped 5-18y) Radiologic records states abdominal pain and distension. EXAM: CT ABDOMEN AND PELVIS WITH CONTRAST TECHNIQUE: Multidetector CT imaging of the abdomen and pelvis was performed using the standard protocol following bolus administration of intravenous contrast. CONTRAST:  135mL OMNIPAQUE IOHEXOL 300 MG/ML  SOLN COMPARISON:  Abdominal radiograph earlier today. FINDINGS: Lower chest: Mild cardiomegaly. Medial left lower lobe atelectasis. Trace left pleural effusions/thickening. Hepatobiliary: Diffusely decreased hepatic density consistent with steatosis. No focal hepatic lesion. High-density contents in the gallbladder likely representing sludge. There is no pericholecystic inflammation. No biliary dilatation. There is no portal venous gas. Pancreas: Fatty atrophy.  No ductal dilatation or inflammation. Spleen: Normal in size without focal abnormality. Adrenals/Urinary Tract: Mild left adrenal thickening. Normal right adrenal gland. No dominant  adrenal nodule. Bilateral renal parenchymal atrophy with lobular contours. No hydronephrosis. There is symmetric bilateral perinephric edema. Cortical low densities within both kidneys are too small to accurately characterize but likely cysts. There is excretion from both kidneys on delayed phase imaging. Urinary bladder is only minimally distended. Mild bladder wall thickening. Stomach/Bowel: There is diffuse gaseous and fluid distension of the  entire colon. Greatest colonic distension in the sigmoid measures 8.3 cm. There is air in the bowel wall involving the ascending and hepatic flexure consistent with pneumatosis. No associated colonic wall thickening. Occasional pericolonic edema involving the ascending colon as well as sigmoid colon. The sigmoid colon is tortuous. There is no evidence of focal bowel mass or cause for obstruction. There is no small bowel obstruction, enteric contrast reaches the colon. No small bowel inflammation. No small bowel pneumatosis. Normal appendix. Ingested material within the stomach. Normal positioning of the duodenum and ligament of Treitz. Vascular/Lymphatic: Normal caliber abdominal aorta. Mild aorto bi-iliac atherosclerosis. The celiac and SMA arteries are patent without evidence of severe stenosis or bowel disease. There is no mesenteric venous or portal venous gas. The portal vein is patent mesenteric veins are patent. There is no evidence of mesenteric venous thrombus. No abdominopelvic adenopathy. Reproductive: Prostate is unremarkable. Other: No free air or pneumoperitoneum. There is trace free fluid in the pericolic gutters without significant ascites. No abscess. No evidence of bowel perforation. Patchy edema in the subcutaneous tissues of the flanks, right greater than left. Subcutaneous densities in the lower anterior abdominal wall typical of medication injection sites. There is a tiny fat containing umbilical hernia. Musculoskeletal: There are no acute or suspicious osseous abnormalities. IMPRESSION: 1. Diffuse gaseous and fluid distension of the entire colon suggesting colonic ileus. Pneumatosis of the ascending and hepatic flexure of the colon. No associated colonic wall thickening or portal venous gas. This can be seen in the setting of bowel ischemia, however there are no secondary findings of bowel inflammation nor supporting mesenteric vascular findings. Benign causes of pneumatosis have also been  reported. Recommend surgical consult. 2. Normal small bowel without obstruction. 3. Hepatic steatosis. 4. Bilateral renal parenchymal atrophy with lobular contours. 5. Mild bladder wall thickening, can be seen with urinary tract infection. Aortic Atherosclerosis (ICD10-I70.0). These results were called by telephone at the time of interpretation on 08/14/2020 at 4:04 pm to provider [facility] , who verbally acknowledged these results. Electronically Signed   By: Keith Rake M.D.   On: 08/14/2020 16:04    Anti-infectives: Anti-infectives (From admission, onward)   Start     Dose/Rate Route Frequency Ordered Stop   08/15/20 1600  ceFAZolin (ANCEF) IVPB 1 g/50 mL premix        1 g 100 mL/hr over 30 Minutes Intravenous Every 8 hours 08/15/20 1429     08/14/20 1730  cefTRIAXone (ROCEPHIN) 1 g in sodium chloride 0.9 % 100 mL IVPB  Status:  Discontinued        1 g 200 mL/hr over 30 Minutes Intravenous Every 24 hours 08/14/20 1647 08/15/20 1429   08/12/20 1230  cephALEXin (KEFLEX) capsule 500 mg  Status:  Discontinued        500 mg Oral Every 12 hours 08/12/20 1139 08/14/20 1647       Assessment/Plan Gout Osteoarthritis Type 2 diabetes - SSI Left upper extremity DVT on anticoagulation - heparin gtt Anemia - hgb 9.2, stable Hypokalemia/Hypomagnesemia - goal K >4.0 and Mg >2.0 to maximize bowel function    Colonic ileus  -  patient still very distended this AM, although reports having a large BM yesterday and is passing flatus - repeat KUB this AM - may need GI decompression - encourage mobilization as able  - no indication for emergency surgical intervention at this time  FEN: IV fluids/n.p.o. ID: Keflex 9/25-9/27; ceftriaxone x1 9/27 DVT: Heparin drip Follow-up: TBD  LOS: 4 days    Norm Parcel , [facility] 08/16/2020, 9:27 AM Please see Amion for pager number during day hours 7:00am-4:30pm

## 2020-08-16 NOTE — Progress Notes (Signed)
Inpatient Diabetes Program Recommendations  AACE/ADA: New Consensus Statement on Inpatient Glycemic Control (2015)  Target Ranges:  Prepandial:   less than 140 mg/dL      Peak postprandial:   less than 180 mg/dL (1-2 hours)      Critically ill patients:  140 - 180 mg/dL   Lab Results  Component Value Date   GLUCAP 90 08/16/2020   HGBA1C 7.1 (H) 06/28/2020    Review of Glycemic Control  Diabetes history: DM2 Outpatient Diabetes medications: 70/30 48 units bid, metformin 1000 mg bid, Victoza 1.2 mg QAM Current orders for Inpatient glycemic control: Lantus 7 units QHS  HgbA1C - 7.1% Hypoglycemia this am with 68, 64 mg/dL, otherwise blood sugars have been within 140-180 mg/dL for entire hospital stay.  Inpatient Diabetes Program Recommendations:     Add Novolog 0-9 units Q4H Will likely need at least 10 units of Lantus QHS  Will follow.  Thank you. Lorenda Peck, RD, LDN, CDE Inpatient Diabetes Coordinator [phone]

## 2020-08-16 NOTE — Progress Notes (Signed)
PROGRESS NOTE    [name]  EZM:[id number] DOB: October 12, 1961 DOA: 08/10/2020 PCP: Charlott Rakes, MD    Chief Complaint  Patient presents with  . Leg Swelling  . Medical Clearance    rehab placement     Brief Narrative:  59 year old black male with known history of IDDM since 2012, HTN, chronic gout Prior scrotal abscess Rx 2017 drained at that time Severe onychomycosis Severe tricompartmental osteoarthritis both knees He is status post bilateral arthroscopies with meniscectomies by Dr. Berenice Primas in 2017 He has had work-up in the past found to be ANA negative RF negative CCP negative and felt to be a combination of his chronic gout and possible osteoarthritis He was recently hospitalized 8/11 through 8/24 with AKI found at that time to have left upper extremity DVT in addition to polyarticular gout He also came in with difficulty with urination found to have SIRS criteria with fever 102.8-CT chest no embolism no pneumonia urine analysis showed negative leukocytes negative nitrites However urine culture grew E. coli He has had intractable pain in upper extremities arms and swelling of his joints and he does not remember ever getting tapped on his joints but on review of chart as above it looks that he has been thoroughly worked up in the past for other rheumatological disease process and was supposed to follow-up with Dr. 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  Patient stated had a large bowel movement yesterday, passing flatus, small bowel movement today.  Patient stated ambulated in hallways home yesterday with physical therapy.  Abdominal films done with stable diffuse colonic dilatation is noted most consistent with colonic ileus.  CT abdomen and pelvis done 08/14/2020 with findings consistent with colonic ileus, pneumatosis of the ascending and hepatic flexure of the colon.  No associated colonic wall thickening or portal venous gas.  Normal small bowel without obstruction.  Hepatic steatosis.  Bilateral renal parenchymal atrophy with lobular contours.  Mild bladder wall thickening.  Patient with no significant change with abdominal distention.  General surgery following, and recommending repeat abdominal films in the morning, mobilization and possible GI decompression.  Keep potassium > 4, magnesium > 2.  Currently n.p.o.  Supportive care.  GI following.  2.  Polyarticular gout with osteoarthritis It is noted that patient had previous work-up with rheumatoid factor and ANA which were negative.  Steroids currently on hold.  Allopurinol discontinued on admission.  Colchicine on hold.  Likely needs SNF.  Will need outpatient follow-up with rheumatology as previously scheduled.  3.  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  Patient noted to have been on 70/30 insulin 50 units twice daily.  CBG this morning at 64.  Patient noted to be asymptomatic.  Change Lantus to 7 units nightly.  Sliding scale insulin.  Continue IV Reglan as patient NPO.  Follow.  4.  Hypokalemia/hypomagnesemia Potassium at 3.6.  Replete to keep potassium > 4.  Magnesium> 2.  Follow.  5.  Left upper extremity DVT Was on apixaban twice daily.  Patient currently n.p.o. due to colonic ileus.  Continue IV heparin.  6.  Iron deficiency anemia Status post IV Feraheme  08/12/2020.  Once on the diet will need to resume oral iron supplementation oral discharge.  H&H stable.  Follow.  Transfusion threshold hemoglobin <7.  7.  Prior bilateral knee surgeries PT/OT.  Needs SNF placement.  8.  Chronic kidney disease stage II Stable.  Follow.  9.  E. coli urinary tract infection Urine cultures with greater than 100,000 colonies of E. coli.  Was on Keflex.  Now on IV Rocephin as patient currently n.p.o.  Follow.  10.  Severe onchogryphosis   DVT prophylaxis: Heparin Code Status: Full Family Communication: Updated patient and sister-in-law, Yolonda at bedside. Disposition:   Status is: Inpatient    Dispo:  Patient From: Home  Planned Disposition: [place]  Expected discharge date: 08/18/20  Medically stable for discharge: No        Consultants:   Gastroenterology: Dr. Collene Mares 08/14/2020  General surgery: Dr. Hassell Done 08/14/2020  Procedures:   CT angiogram chest 08/10/2020  CT abdomen and pelvis 08/14/2020  2D echo 08/11/2020    Antimicrobials:   Keflex 08/12/2020>>>> 08/14/2020  IV Ancef 08/15/2020>>>>  IV Rocephin 08/14/2020>>> 08/15/2020   Subjective: Patient laying in bed.  Sister-in-law Yolonda at bedside.  Patient stated had small bowel movement today.  Stated had a large bowel movement yesterday.  Denies any shortness of breath.  No chest pain.  No nausea or emesis.  Objective: Vitals:   08/15/20 1202 08/15/20 2112 08/16/20 0553 08/16/20 1042  BP: 123/84 (!) 169/110 (!) 159/118 (!) 147/103  Pulse: 77 86 95 89  Resp: 16 18 19    Temp: (!) 97.3 F (36.3 C) 97.9 F (36.6 C) (!) 97.5 F (36.4 C) 98.1 F (36.7 C)  TempSrc: Oral  Oral Oral  SpO2: 98% 97% 98% 100%  Weight:      Height:        Intake/Output Summary (Last 24 hours) at 08/16/2020 1229 Last data filed at 08/16/2020 1057 Gross per 24 hour  Intake 2005.2 ml  Output 875 ml  Net 1130.2 ml   Filed Weights   08/11/20 0321  Weight: 106.2 kg     Examination:  General exam: Appears calm and comfortable  Respiratory system: Clear to auscultation. Respiratory effort normal. Cardiovascular system: S1 & S2 heard, RRR. No JVD, murmurs, rubs, gallops or clicks. No pedal edema. Gastrointestinal system: Abdomen is significantly distended, tight, high-pitched bowel sounds, nontender to palpation. Central nervous system: Alert and oriented. No focal neurological deficits. Extremities: Symmetric 5 x 5 power. Skin: No rashes, lesions or ulcers Psychiatry: Judgement and insight appear normal. Mood & affect appropriate.     Data Reviewed: I have personally reviewed following labs and imaging studies  CBC: Recent Labs  Lab 08/11/20 0248 08/11/20 0248 08/12/20 0557 08/13/20 0557 08/14/20 0501 08/15/20 0545 08/16/20 0505  WBC 10.3   < > 10.5 10.8* 9.4 10.7* 10.3  NEUTROABS 8.1*  --  8.9* 8.5* 7.3 7.2  --   HGB 8.0*   < > 7.7* 8.4* 8.2* 9.1* 9.2*  HCT 25.9*   < > 24.3* 26.3* 26.6* 29.6* 29.8*  MCV 78.2*   < > 77.9* 76.0* 78.5* 78.5* 78.2*  PLT 250   < > 334 387 380 449* 513*   < > = values in this interval not displayed.    Basic Metabolic Panel: Recent Labs  Lab 08/11/20 0248 08/11/20 0248 08/12/20 0557 08/13/20 0557 08/14/20 0501 08/15/20 0545 08/16/20 0505  NA 138   < > 138 143 144 134* 141  K 3.4*   < > 2.8* 2.7* 3.7 3.0* 3.6  CL 103   < > 103 106 108 104 111  CO2 21*   < > 21* 23 22 21* 21*  GLUCOSE 176*   < > 160* 135* 146* 135* 68*  BUN 15   < > 18 22* 29* 30* 24*  CREATININE 1.04   < > 0.97 1.14 1.06 1.08 0.91  CALCIUM 7.8*   < > 8.4* 8.7* 8.9 8.5* 8.3*  MG 1.5*  --  1.7 1.9 2.0 1.9  --    < > = values in this interval not displayed.    GFR: Estimated Creatinine Clearance: 96.9 mL/min (by C-G formula based on SCr of 0.91 mg/dL).  Liver Function Tests: Recent Labs  Lab 08/12/20 0557 08/13/20 0557 08/14/20 0501 08/15/20 0545 08/16/20 0505  AST 72* 83* 50* 31 22  ALT 50* 67* 60* 52* 39  ALKPHOS 92  97 91 88 90  BILITOT 0.9 0.5 0.5 0.5 0.5  PROT 6.5 6.6 6.5 6.6 6.4*  ALBUMIN 2.4* 2.3* 2.5* 2.6* 2.6*    CBG: Recent Labs  Lab 08/15/20 2106 08/15/20 2156 08/16/20 0722 08/16/20 1040 08/16/20 1143  GLUCAP 63* 109* 71 64* 90     Recent Results (from the past 240 hour(s))  SARS Coronavirus 2 by RT PCR (hospital order, performed in [facility] hospital lab) Nasopharyngeal Nasopharyngeal Swab     Status: None   Collection Time: 08/08/20 12:04 PM   Specimen: Nasopharyngeal Swab  Result Value Ref Range Status   SARS Coronavirus 2 NEGATIVE NEGATIVE Final    Comment: (NOTE) SARS-CoV-2 target nucleic acids are NOT DETECTED.  The SARS-CoV-2 RNA is generally detectable in upper and lower respiratory specimens during the acute phase of infection. The lowest concentration of SARS-CoV-2 viral copies this assay can detect is 250 copies / mL. A negative result does not preclude SARS-CoV-2 infection and should not be used as the sole basis for treatment or other patient management decisions.  A negative result may occur with improper specimen collection / handling, submission of specimen other than nasopharyngeal swab, presence of viral mutation(s) within the areas targeted by this assay, and inadequate number of viral copies (<250 copies / mL). A negative result must be combined with clinical observations, patient history, and epidemiological information.  Fact Sheet for Patients:   StrictlyIdeas.no  Fact Sheet for Healthcare Providers: BankingDealers.co.za  This test is not yet approved or  cleared by the [place] FDA and has been authorized for detection and/or diagnosis of SARS-CoV-2 by FDA under an Emergency Use Authorization (EUA).  This EUA will remain in effect (meaning this test can be used) for the duration of the COVID-19 declaration under Section 564(b)(1) of the Act, 21 U.S.C. section 360bbb-3(b)(1), unless the  authorization is terminated or revoked sooner.  Performed at [facility], [place] [address], [place], [place] [address]   Blood Culture (routine x 2)     Status: None   Collection Time: 08/10/20  8:08 PM   Specimen: BLOOD  Result Value Ref Range Status   Specimen Description   Final    BLOOD RIGHT ANTECUBITAL Performed at [facility] [address], [place], [place] [address]    Special Requests   Final    BOTTLES DRAWN AEROBIC AND ANAEROBIC Blood  Culture adequate volume Performed at [facility] [address] [place], [place] [address]    Culture   Final    NO GROWTH 5 DAYS Performed at [facility], [place] [address] [place], [place] [address]    Report Status 08/15/2020 FINAL  Final  Respiratory Panel by RT PCR (Flu A&B, Covid) - Nasopharyngeal Swab     Status: None   Collection Time: 08/10/20  8:08 PM   Specimen: Nasopharyngeal Swab  Result Value Ref Range Status   SARS Coronavirus 2 by RT PCR NEGATIVE NEGATIVE Final    Comment: (NOTE) SARS-CoV-2 target nucleic acids are NOT DETECTED.  The SARS-CoV-2 RNA is generally detectable in upper respiratoy specimens during the acute phase of infection. The lowest concentration of SARS-CoV-2 viral copies this assay can detect is 131 copies/mL. A negative result does not preclude SARS-Cov-2 infection and should not be used as the sole basis for treatment or other patient management decisions. A negative result may occur with  improper specimen collection/handling, submission of specimen other than nasopharyngeal swab, presence of viral mutation(s) within the areas targeted by this assay, and inadequate number of viral copies (<131 copies/mL). A negative result must be combined with clinical observations, patient history, and epidemiological information. The expected result is Negative.  Fact Sheet for Patients:  PinkCheek.be  Fact  Sheet for Healthcare Providers:  GravelBags.it  This test is no t yet approved or cleared by the [place] FDA and  has been authorized for detection and/or diagnosis of SARS-CoV-2 by FDA under an Emergency Use Authorization (EUA). This EUA will remain  in effect (meaning this test can be used) for the duration of the COVID-19 declaration under Section 564(b)(1) of the Act, 21 U.S.C. section 360bbb-3(b)(1), unless the authorization is terminated or revoked sooner.     Influenza A by PCR NEGATIVE NEGATIVE Final   Influenza B by PCR NEGATIVE NEGATIVE Final    Comment: (NOTE) The Xpert Xpress SARS-CoV-2/FLU/RSV assay is intended as an aid in  the diagnosis of influenza from Nasopharyngeal swab specimens and  should not be used as a sole basis for treatment. Nasal washings and  aspirates are unacceptable for Xpert Xpress SARS-CoV-2/FLU/RSV  testing.  Fact Sheet for Patients: PinkCheek.be  Fact Sheet for Healthcare Providers: GravelBags.it  This test is not yet approved or cleared by the [place] FDA and  has been authorized for detection and/or diagnosis of SARS-CoV-2 by  FDA under an Emergency Use Authorization (EUA). This EUA will remain  in effect (meaning this test can be used) for the duration of the  Covid-19 declaration under Section 564(b)(1) of the Act, 21  U.S.C. section 360bbb-3(b)(1), unless the authorization is  terminated or revoked. Performed at [facility], [place] [address], [place], [place] [address]   Urine culture     Status: Abnormal   Collection Time: 08/11/20  1:00 AM   Specimen: In/Out Cath Urine  Result Value Ref Range Status   Specimen Description   Final    IN/OUT CATH URINE Performed at [facility] [address], [place], [place] [address]    Special Requests   Final    NONE Performed at [facility], [place] [address], [place], [place] [address]    Culture >=100,000 COLONIES/mL ESCHERICHIA COLI (A)  Final   Report Status 08/13/2020 FINAL  Final   Organism ID, Bacteria ESCHERICHIA COLI (A)  Final      Susceptibility   Escherichia  coli - MIC*    AMPICILLIN 4 SENSITIVE Sensitive     CEFAZOLIN <=4 SENSITIVE Sensitive     CEFTRIAXONE <=0.25 SENSITIVE Sensitive     CIPROFLOXACIN <=0.25 SENSITIVE Sensitive     GENTAMICIN <=1 SENSITIVE Sensitive     IMIPENEM <=0.25 SENSITIVE Sensitive     NITROFURANTOIN <=16 SENSITIVE Sensitive     TRIMETH/SULFA <=20 SENSITIVE Sensitive     AMPICILLIN/SULBACTAM <=2 SENSITIVE Sensitive     PIP/TAZO <=4 SENSITIVE Sensitive     * >=100,000 COLONIES/mL ESCHERICHIA COLI  Blood Culture (routine x 2)     Status: None   Collection Time: 08/11/20  2:48 AM   Specimen: BLOOD  Result Value Ref Range Status   Specimen Description   Final    BLOOD RIGHT HAND Performed at [facility] [address], [place], [place] [address]    Special Requests   Final    BOTTLES DRAWN AEROBIC AND ANAEROBIC Blood Culture adequate volume Performed at [facility] [address], [place], [place] [address]    Culture   Final    NO GROWTH 5 DAYS Performed at [facility], [place] [address] [place], [place] [address]    Report Status 08/16/2020 FINAL  Final         Radiology Studies: CT ABDOMEN PELVIS W CONTRAST  Result Date: 08/14/2020 CLINICAL DATA:  Provided history of: Bowel obstruction suspected (Ped 5-18y) Radiologic records states abdominal pain and distension. EXAM: CT ABDOMEN AND PELVIS WITH CONTRAST TECHNIQUE: Multidetector CT imaging of the abdomen and pelvis was performed using the standard protocol following bolus administration of intravenous contrast. CONTRAST:  133mL OMNIPAQUE IOHEXOL 300 MG/ML  SOLN COMPARISON:  Abdominal radiograph earlier today. FINDINGS: Lower chest: Mild cardiomegaly. Medial left lower  lobe atelectasis. Trace left pleural effusions/thickening. Hepatobiliary: Diffusely decreased hepatic density consistent with steatosis. No focal hepatic lesion. High-density contents in the gallbladder likely representing sludge. There is no pericholecystic inflammation. No biliary dilatation. There is no portal venous gas. Pancreas: Fatty atrophy.  No ductal dilatation or inflammation. Spleen: Normal in size without focal abnormality. Adrenals/Urinary Tract: Mild left adrenal thickening. Normal right adrenal gland. No dominant adrenal nodule. Bilateral renal parenchymal atrophy with lobular contours. No hydronephrosis. There is symmetric bilateral perinephric edema. Cortical low densities within both kidneys are too small to accurately characterize but likely cysts. There is excretion from both kidneys on delayed phase imaging. Urinary bladder is only minimally distended. Mild bladder wall thickening. Stomach/Bowel: There is diffuse gaseous and fluid distension of the entire colon. Greatest colonic distension in the sigmoid measures 8.3 cm. There is air in the bowel wall involving the ascending and hepatic flexure consistent with pneumatosis. No associated colonic wall thickening. Occasional pericolonic edema involving the ascending colon as well as sigmoid colon. The sigmoid colon is tortuous. There is no evidence of focal bowel mass or cause for obstruction. There is no small bowel obstruction, enteric contrast reaches the colon. No small bowel inflammation. No small bowel pneumatosis. Normal appendix. Ingested material within the stomach. Normal positioning of the duodenum and ligament of Treitz. Vascular/Lymphatic: Normal caliber abdominal aorta. Mild aorto bi-iliac atherosclerosis. The celiac and SMA arteries are patent without evidence of severe stenosis or bowel disease. There is no mesenteric venous or portal venous gas. The portal vein is patent mesenteric veins are patent. There is no evidence of  mesenteric venous thrombus. No abdominopelvic adenopathy. Reproductive: Prostate is unremarkable. Other: No free air or pneumoperitoneum. There is trace free fluid  in the pericolic gutters without significant ascites. No abscess. No evidence of bowel perforation. Patchy edema in the subcutaneous tissues of the flanks, right greater than left. Subcutaneous densities in the lower anterior abdominal wall typical of medication injection sites. There is a tiny fat containing umbilical hernia. Musculoskeletal: There are no acute or suspicious osseous abnormalities. IMPRESSION: 1. Diffuse gaseous and fluid distension of the entire colon suggesting colonic ileus. Pneumatosis of the ascending and hepatic flexure of the colon. No associated colonic wall thickening or portal venous gas. This can be seen in the setting of bowel ischemia, however there are no secondary findings of bowel inflammation nor supporting mesenteric vascular findings. Benign causes of pneumatosis have also been reported. Recommend surgical consult. 2. Normal small bowel without obstruction. 3. Hepatic steatosis. 4. Bilateral renal parenchymal atrophy with lobular contours. 5. Mild bladder wall thickening, can be seen with urinary tract infection. Aortic Atherosclerosis (ICD10-I70.0). These results were called by telephone at the time of interpretation on 08/14/2020 at 4:04 pm to provider [facility] , who verbally acknowledged these results. Electronically Signed   By: Keith Rake M.D.   On: 08/14/2020 16:04   DG Abd Portable 1V  Result Date: 08/16/2020 CLINICAL DATA:  Ileus. EXAM: PORTABLE ABDOMEN - 1 VIEW COMPARISON:  August 14, 2020. FINDINGS: Stable diffuse colonic dilatation is noted with stool seen in the right colon. No definite small bowel dilatation is noted. These findings are most consistent with colonic ileus. No abnormal calcifications are noted. IMPRESSION: Stable diffuse colonic dilatation is noted most consistent with  colonic ileus. Electronically Signed   By: Marijo Conception M.D.   On: 08/16/2020 10:02        Scheduled Meds: . [START ON 08/17/2020] insulin glargine  7 Units Subcutaneous QHS  . metoCLOPramide (REGLAN) injection  5 mg Intravenous Q8H  . metoprolol tartrate  7.5 mg Intravenous Q6H  . potassium chloride  30 mEq Oral BID   Continuous Infusions: . 0.9 % NaCl with KCl 40 mEq / L 100 mL/hr at 08/16/20 0332  .  ceFAZolin (ANCEF) IV 1 g (08/16/20 0701)  . heparin 850 Units/hr (08/16/20 0702)     LOS: 4 days    Time spent: 35 minutes    Irine Seal, MD [facility]   To contact the attending provider between 7A-7P or the covering provider during after hours 7P-7A, please log into the web site [email or web address] and access using universal [place] password for that web site. If you do not have the password, please call the hospital operator.  08/16/2020, 12:29 PM

## 2020-08-16 NOTE — Progress Notes (Signed)
Hypoglycemic Event  CBG: 64 Treatment: D50 25 mL (12.5 gm)  Symptoms: None  Follow-up CBG: Time: 11:43 CBG Result :90  Possible Reasons for Event: Inadequate meal intake  Comments/MD notified: Dr. [place] Silos Notified    Lorriane Shire Clariss Johnston Ebbs

## 2020-08-16 NOTE — Progress Notes (Signed)
UNASSIGNED PATIENT Subjective: Since I last evaluated the patient, there has not been much change in the patient's overall status.  He denies any particular GI complaints except for abdominal distention.  He had a large volume bowel movement today and has been passing flatus.  However the patient has not change his position much in bed as previously requested.  Objective: Vital signs in last 24 hours: Temp:  [97.5 F (36.4 C)-98.1 F (36.7 C)] 97.5 F (36.4 C) (09/29 1236) Pulse Rate:  [79-95] 79 (09/29 1236) Resp:  [18-19] 18 (09/29 1236) BP: (147-189)/(103-130) 189/130 (09/29 1236) SpO2:  [97 %-100 %] 97 % (09/29 1236) Last BM Date: 08/15/20  Intake/Output from previous day: 09/28 0701 - 09/29 0700 In: 2005.2 [I.V.:1855.2; IV Piggyback:150] Out: 600 [Urine:600] Intake/Output this shift: Total I/O In: -  Out: 275 [Urine:275]  General appearance: alert, cooperative, appears stated age, morbidly obese and pale Resp: clear to auscultation bilaterally Cardio: regular rate and rhythm, S1, S2 normal, no murmur, click, rub or gallop GI: Significantly distended non-tender; with almost no bowel sounds; no masses,  no organomegaly Extremities: extremities normal, atraumatic, no cyanosis or edema  Lab Results: Recent Labs    08/14/20 0501 08/15/20 0545 08/16/20 0505  WBC 9.4 10.7* 10.3  HGB 8.2* 9.1* 9.2*  HCT 26.6* 29.6* 29.8*  PLT 380 449* 513*   BMET Recent Labs    08/14/20 0501 08/15/20 0545 08/16/20 0505  NA 144 134* 141  K 3.7 3.0* 3.6  CL 108 104 111  CO2 22 21* 21*  GLUCOSE 146* 135* 68*  BUN 29* 30* 24*  CREATININE 1.06 1.08 0.91  CALCIUM 8.9 8.5* 8.3*   LFT Recent Labs    08/16/20 0505  PROT 6.4*  ALBUMIN 2.6*  AST 22  ALT 39  ALKPHOS 90  BILITOT 0.5   Studies/Results: CT ABDOMEN PELVIS W CONTRAST  Result Date: 08/14/2020 CLINICAL DATA:  Provided history of: Bowel obstruction suspected (Ped 5-18y) Radiologic records states abdominal pain and  distension. EXAM: CT ABDOMEN AND PELVIS WITH CONTRAST TECHNIQUE: Multidetector CT imaging of the abdomen and pelvis was performed using the standard protocol following bolus administration of intravenous contrast. CONTRAST:  165mL OMNIPAQUE IOHEXOL 300 MG/ML  SOLN COMPARISON:  Abdominal radiograph earlier today. FINDINGS: Lower chest: Mild cardiomegaly. Medial left lower lobe atelectasis. Trace left pleural effusions/thickening. Hepatobiliary: Diffusely decreased hepatic density consistent with steatosis. No focal hepatic lesion. High-density contents in the gallbladder likely representing sludge. There is no pericholecystic inflammation. No biliary dilatation. There is no portal venous gas. Pancreas: Fatty atrophy.  No ductal dilatation or inflammation. Spleen: Normal in size without focal abnormality. Adrenals/Urinary Tract: Mild left adrenal thickening. Normal right adrenal gland. No dominant adrenal nodule. Bilateral renal parenchymal atrophy with lobular contours. No hydronephrosis. There is symmetric bilateral perinephric edema. Cortical low densities within both kidneys are too small to accurately characterize but likely cysts. There is excretion from both kidneys on delayed phase imaging. Urinary bladder is only minimally distended. Mild bladder wall thickening. Stomach/Bowel: There is diffuse gaseous and fluid distension of the entire colon. Greatest colonic distension in the sigmoid measures 8.3 cm. There is air in the bowel wall involving the ascending and hepatic flexure consistent with pneumatosis. No associated colonic wall thickening. Occasional pericolonic edema involving the ascending colon as well as sigmoid colon. The sigmoid colon is tortuous. There is no evidence of focal bowel mass or cause for obstruction. There is no small bowel obstruction, enteric contrast reaches the colon. No small bowel inflammation.  No small bowel pneumatosis. Normal appendix. Ingested material within the stomach.  Normal positioning of the duodenum and ligament of Treitz. Vascular/Lymphatic: Normal caliber abdominal aorta. Mild aorto bi-iliac atherosclerosis. The celiac and SMA arteries are patent without evidence of severe stenosis or bowel disease. There is no mesenteric venous or portal venous gas. The portal vein is patent mesenteric veins are patent. There is no evidence of mesenteric venous thrombus. No abdominopelvic adenopathy. Reproductive: Prostate is unremarkable. Other: No free air or pneumoperitoneum. There is trace free fluid in the pericolic gutters without significant ascites. No abscess. No evidence of bowel perforation. Patchy edema in the subcutaneous tissues of the flanks, right greater than left. Subcutaneous densities in the lower anterior abdominal wall typical of medication injection sites. There is a tiny fat containing umbilical hernia. Musculoskeletal: There are no acute or suspicious osseous abnormalities. IMPRESSION: 1. Diffuse gaseous and fluid distension of the entire colon suggesting colonic ileus. Pneumatosis of the ascending and hepatic flexure of the colon. No associated colonic wall thickening or portal venous gas. This can be seen in the setting of bowel ischemia, however there are no secondary findings of bowel inflammation nor supporting mesenteric vascular findings. Benign causes of pneumatosis have also been reported. Recommend surgical consult. 2. Normal small bowel without obstruction. 3. Hepatic steatosis. 4. Bilateral renal parenchymal atrophy with lobular contours. 5. Mild bladder wall thickening, can be seen with urinary tract infection. Aortic Atherosclerosis (ICD10-I70.0). These results were called by telephone at the time of interpretation on 08/14/2020 at 4:04 pm to provider [facility] , who verbally acknowledged these results. Electronically Signed   By: Keith Rake M.D.   On: 08/14/2020 16:04   DG Abd Portable 1V  Result Date: 08/16/2020 CLINICAL DATA:   Ileus. EXAM: PORTABLE ABDOMEN - 1 VIEW COMPARISON:  August 14, 2020. FINDINGS: Stable diffuse colonic dilatation is noted with stool seen in the right colon. No definite small bowel dilatation is noted. These findings are most consistent with colonic ileus. No abnormal calcifications are noted. IMPRESSION: Stable diffuse colonic dilatation is noted most consistent with colonic ileus. Electronically Signed   By: Marijo Conception M.D.   On: 08/16/2020 10:02    Medications: I have reviewed the patient's current medications.  Assessment/Plan: 1) Colonic ileus-patient's been encouraged to ambulate in the room and change his position every 2 hours his electrolytes need to be checked and corrected.  Repeat abdominal film should be done tomorrow morning; patient does not appear sick at this time. 2) Gout/osteoarthritis. 3) Left upper extremity DVT on anticoagulation. 4) Hypokalemia/hypomagnesemia-being corrected. 5)? Anemia of chronic disease. 6) Morbid obesity.    LOS: 4 days   Juanita Craver 08/16/2020, 3:06 PM

## 2020-08-16 NOTE — Progress Notes (Signed)
[facility] for IV heparin  Indication: history of DVT  Allergies  Allergen Reactions  . Hctz [Hydrochlorothiazide]     Hx of gout; uric acid 14.1 on 03/13/20 Acute gout L elbow 8/11-8/24/21    Patient Measurements: Height: 5\' 5"  (165.1 cm) Weight: 106.2 kg (234 lb 2.1 oz) IBW/kg (Calculated) : 61.5 Heparin Dosing Weight: 85.7 kg  Vital Signs: Temp: 97.5 F (36.4 C) (09/29 1236) Temp Source: Oral (09/29 1236) BP: 189/130 (09/29 1236) Pulse Rate: 79 (09/29 1236)  Labs: Recent Labs    08/14/20 0501 08/14/20 0501 08/14/20 2009 08/15/20 0545 08/15/20 0545 08/15/20 1217 08/16/20 0505 08/16/20 1344  HGB 8.2*   < >  --  9.1*  --   --  9.2*  --   HCT 26.6*  --   --  29.6*  --   --  29.8*  --   PLT 380  --   --  449*  --   --  513*  --   APTT  --    < > 39* 93*   < > 90* 116* 84*  HEPARINUNFRC  --   --  >2.20* 1.94*  --   --  1.30*  --   CREATININE 1.06  --   --  1.08  --   --  0.91  --    < > = values in this interval not displayed.    Estimated Creatinine Clearance: 96.9 mL/min (by C-G formula based on SCr of 0.91 mg/dL).   Assessment: 35 y/oM with PMH of DVT (07/03/2020) on Apixaban PTA admitted on 08/10/2020 with SIRS. Given worsening abdominal distention, CT abd pelvis done today, which revealed a colonic ileus with pneumatosis of the ascending and hepatic flexure of the colon. PO meds held. Pharmacy consulted for IV heparin dosing while Apixaban on hold. Last dose of Apixaban 5mg  PO given this morning at 0921. Note that recent Apixaban use may falsely elevate heparin levels.   Baseline labs: Hg low at 8.2, pltc WNL, Heparin level >2.2, INR 1.5, aPTT 39 se, Scr 1.08  Today, 08/16/2020:  APTT 116sec- now SUPRAtherapeutic on 1000 units/hr  Heparin level 1.3- trending down but still elevated secondary to DOAC interaction  Hg low but stable, pltc increased  No bleeding or infusion related concerns reported by RN  2nd shift  f/u:  APTT = 84 sec follow heparin infusion reduced to 850 units/hr  No complications of therapy noted  Goal of Therapy:  aPTT 66-102 seconds Heparin level 0.3-0.7 units/ml Monitor platelets by anticoagulation protocol: Yes   Plan:   Continue heparin infusion @ 850 units/hr   Daily CBC, aPTT, and heparin level until DOAC effects on HL have dissipated  Monitor closely for s/sx of bleeding, ability to resume Eliquis  Leone Haven, PharmD 08/16/2020, 4:05 PM

## 2020-08-17 ENCOUNTER — Inpatient Hospital Stay (HOSPITAL_COMMUNITY): Payer: Medicare HMO

## 2020-08-17 DIAGNOSIS — M10041 Idiopathic gout, right hand: Secondary | ICD-10-CM | POA: Diagnosis not present

## 2020-08-17 DIAGNOSIS — I82622 Acute embolism and thrombosis of deep veins of left upper extremity: Secondary | ICD-10-CM | POA: Diagnosis not present

## 2020-08-17 DIAGNOSIS — K567 Ileus, unspecified: Secondary | ICD-10-CM | POA: Diagnosis not present

## 2020-08-17 DIAGNOSIS — D509 Iron deficiency anemia, unspecified: Secondary | ICD-10-CM | POA: Diagnosis not present

## 2020-08-17 LAB — MAGNESIUM: Magnesium: 1.8 mg/dL (ref 1.7–2.4)

## 2020-08-17 LAB — GLUCOSE, CAPILLARY
Glucose-Capillary: 106 mg/dL — ABNORMAL HIGH (ref 70–99)
Glucose-Capillary: 106 mg/dL — ABNORMAL HIGH (ref 70–99)
Glucose-Capillary: 106 mg/dL — ABNORMAL HIGH (ref 70–99)
Glucose-Capillary: 125 mg/dL — ABNORMAL HIGH (ref 70–99)
Glucose-Capillary: 159 mg/dL — ABNORMAL HIGH (ref 70–99)
Glucose-Capillary: 96 mg/dL (ref 70–99)

## 2020-08-17 LAB — RENAL FUNCTION PANEL
Albumin: 2.8 g/dL — ABNORMAL LOW (ref 3.5–5.0)
Anion gap: 9 (ref 5–15)
BUN: 18 mg/dL (ref 6–20)
CO2: 18 mmol/L — ABNORMAL LOW (ref 22–32)
Calcium: 8.5 mg/dL — ABNORMAL LOW (ref 8.9–10.3)
Chloride: 112 mmol/L — ABNORMAL HIGH (ref 98–111)
Creatinine, Ser: 0.84 mg/dL (ref 0.61–1.24)
GFR calc Af Amer: >60 mL/min (ref >60)
GFR calc non Af Amer: >60 mL/min (ref >60)
Glucose, Bld: 96 mg/dL (ref 70–99)
Phosphorus: 2.8 mg/dL (ref 2.5–4.6)
Potassium: 4.1 mmol/L (ref 3.5–5.1)
Sodium: 139 mmol/L (ref 135–145)

## 2020-08-17 LAB — CBC
HCT: 30.3 % — ABNORMAL LOW (ref 39.0–52.0)
Hemoglobin: 9.1 g/dL — ABNORMAL LOW (ref 13.0–17.0)
MCH: 24.1 pg — ABNORMAL LOW (ref 26.0–34.0)
MCHC: 30 g/dL (ref 30.0–36.0)
MCV: 80.4 fL (ref 80.0–100.0)
Platelets: 465 10*3/uL — ABNORMAL HIGH (ref 150–400)
RBC: 3.77 MIL/uL — ABNORMAL LOW (ref 4.22–5.81)
RDW: 22.4 % — ABNORMAL HIGH (ref 11.5–15.5)
WBC: 9.9 10*3/uL (ref 4.0–10.5)
nRBC: 1 % — ABNORMAL HIGH (ref 0.0–0.2)

## 2020-08-17 LAB — HEPARIN LEVEL (UNFRACTIONATED): Heparin Unfractionated: 0.8 IU/mL — ABNORMAL HIGH (ref 0.30–0.70)

## 2020-08-17 LAB — APTT: aPTT: 86 seconds — ABNORMAL HIGH (ref 24–36)

## 2020-08-17 MED ORDER — MAGNESIUM SULFATE 2 GM/50ML IV SOLN
2.0000 g | Freq: Once | INTRAVENOUS | Status: AC
  Administered 2020-08-17: 2 g via INTRAVENOUS
  Filled 2020-08-17: qty 50

## 2020-08-17 NOTE — Plan of Care (Signed)
  Problem: Consults Goal: Skin Care Protocol Initiated - if Braden Score 18 or less Description: If consults are not indicated, leave blank or document N/A Outcome: Progressing Goal: Nutrition Consult-if indicated Outcome: Progressing Goal: Diabetes Guidelines if Diabetic/Glucose > 140 Description: If diabetic or lab glucose is > 140 mg/dl - Initiate Diabetes/Hyperglycemia Guidelines & Document Interventions  Outcome: Progressing   Problem: RH BOWEL ELIMINATION Goal: RH STG MANAGE BOWEL WITH ASSISTANCE Description: STG Manage Bowel with Assistance. Outcome: Progressing   Problem: RH BLADDER ELIMINATION Goal: RH STG MANAGE BLADDER WITH ASSISTANCE Description: STG Manage Bladder With Assistance Outcome: Progressing Goal: RH STG MANAGE BLADDER WITH EQUIPMENT WITH ASSISTANCE Description: STG Manage Bladder With Equipment With Assistance Outcome: Progressing   Problem: RH SKIN INTEGRITY Goal: RH STG SKIN FREE OF INFECTION/BREAKDOWN Outcome: Progressing

## 2020-08-17 NOTE — Progress Notes (Signed)
PROGRESS NOTE    [name]  QKM:[id number] DOB: 1961/11/13 DOA: 08/10/2020 PCP: Charlott Rakes, MD    Chief Complaint  Patient presents with  . Leg Swelling  . Medical Clearance    rehab placement     Brief Narrative:  59 year old black male with known history of IDDM since 2012, HTN, chronic gout Prior scrotal abscess Rx 2017 drained at that time Severe onychomycosis Severe tricompartmental osteoarthritis both knees He is status post bilateral arthroscopies with meniscectomies by Dr. Berenice Primas in 2017 He has had work-up in the past found to be ANA negative RF negative CCP negative and felt to be a combination of his chronic gout and possible osteoarthritis He was recently hospitalized 8/11 through 8/24 with AKI found at that time to have left upper extremity DVT in addition to polyarticular gout He also came in with difficulty with urination found to have SIRS criteria with fever 102.8-CT chest no embolism no pneumonia urine analysis showed negative leukocytes negative nitrites However urine culture grew E. coli He has had intractable pain in upper extremities arms and swelling of his joints and he does not remember ever getting tapped on his joints but on review of chart as above it looks that he has been thoroughly worked up in the past for other rheumatological disease process and was supposed to follow-up with Dr. Estanislado Pandy rheumatology in the outpatient setting-it is unclear if this was ever done   Assessment & Plan:   Principal Problem:   SIRS (systemic inflammatory response syndrome) ([place]) Active Problems:   Essential hypertension   Gout   Iron deficiency anemia   Elevated troponin   Generalized weakness   Uncontrolled type 2 diabetes mellitus with hyperglycemia, without long-term current use of insulin (HCC)   Acute deep vein thrombosis (DVT) of brachial vein of left upper extremity (HCC)   Ileus (HCC)   Hypokalemia   CKD (chronic kidney disease) stage 2, GFR  60-89 ml/min   Polyarticular gout  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  Patient stated had a large bowel movement on 08/15/2020, passing flatus.  Patient stated had a large bowel movement today however per RN patient with a smear.  Per RN patient has not had any significant bowel movement since 08/15/2020. Patient stated ambulated in hallways on 08/15/2020 with therapy.  Patient sitting up in chair. Abdominal films done with stable diffuse colonic dilatation is noted most consistent with colonic ileus/adynamic ileus with progression and colonic pneumatosis..  CT abdomen and pelvis done 08/14/2020 with findings consistent with colonic ileus, pneumatosis of the ascending and hepatic flexure of the colon.  No associated colonic wall thickening or portal venous gas.  Normal small bowel without obstruction.  Hepatic steatosis.  Bilateral renal parenchymal atrophy with lobular contours.  Mild bladder wall thickening.  Patient with no significant change with abdominal distention.  General surgery following, and recommending repeat abdominal films in the morning, mobilization and possible GI decompression.  GI following.  Keep potassium > 4, magnesium > 2.  Currently n.p.o.  Supportive care.  GI following.  2.  Polyarticular gout with osteoarthritis It is noted that patient had previous work-up with rheumatoid factor and ANA which were negative.  Steroids currently on hold.  Allopurinol discontinued on admission.  Colchicine on hold.  Likely needs SNF.  Will need outpatient follow-up with rheumatology as previously scheduled.  3.  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  Patient noted to  have been on 70/30 insulin 50 units twice daily.  CBG was 64 the morning of 08/16/2020 and as such Lantus changed to 7 units nightly.  CBG at 106 this morning.  Sliding scale insulin.   Continue IV Reglan.  Follow.    4.  Hypokalemia/hypomagnesemia Potassium currently at 4.1.  Magnesium at 1.8.  Magnesium sulfate 2 g IV x1.  Keep potassium > 4, keep magnesium > 2.    5.  Left upper extremity DVT Was on apixaban twice daily.  Patient currently n.p.o. due to colonic ileus.  IV heparin for anticoagulation.  Once on a diet consistently will resume apixaban.  Follow.   6.  Iron deficiency anemia Status post IV Feraheme 08/12/2020.  Once on the diet will need to resume oral iron supplementation oral discharge.  H&H stable at 9.1.  Follow.  Transfusion threshold hemoglobin <7.  7.  Prior bilateral knee surgeries PT/OT.  Needs SNF placement.  8.  Chronic kidney disease stage II Stable.  Follow.  9.  E. coli urinary tract infection Urine cultures > 100,000 colonies of E. coli.  Was on Keflex.  Now on IV Ancef as patient currently n.p.o.  Follow.    10.  Severe onchogryphosis   DVT prophylaxis: Heparin Code Status: Full Family Communication: Updated patient and wife at bedside.  Disposition:   Status is: Inpatient    Dispo:  Patient From: Home  Planned Disposition: [place]  Expected discharge date: 08/22/20  Medically stable for discharge: No        Consultants:   Gastroenterology: Dr. Collene Mares 08/14/2020  General surgery: Dr. Hassell Done 08/14/2020  Procedures:   CT angiogram chest 08/10/2020  CT abdomen and pelvis 08/14/2020  2D echo 08/11/2020    Antimicrobials:   Keflex 08/12/2020>>>> 08/14/2020  IV Ancef 08/15/2020>>>>  IV Rocephin 08/14/2020>>> 08/15/2020   Subjective: Patient sitting up in chair.  Some complaints of some abdominal discomfort.  Stated he had a bowel movement however per RN he just had a smear.  Positive flatus.  Denies any chest pain.  No shortness of breath.  No nausea or emesis.   Objective: Vitals:   08/16/20 1946 08/17/20 0030 08/17/20 0429 08/17/20 1114  BP: (!) 173/120 (!) 176/111 (!) 163/116 (!) 172/116   Pulse: 84 82 88 93  Resp: 20 20 20 16   Temp: 97.8 F (36.6 C)  (!) 97.5 F (36.4 C) 98 F (36.7 C)  TempSrc: Oral  Oral Oral  SpO2: 98%  97% 100%  Weight:      Height:        Intake/Output Summary (Last 24 hours) at 08/17/2020 1314 Last data filed at 08/17/2020 0952 Gross per 24 hour  Intake 1532.17 ml  Output 1450 ml  Net 82.17 ml   Filed Weights   08/11/20 0321  Weight: 106.2 kg    Examination:  General exam: NAD Respiratory system: Decreased breath sounds in the bases.  No wheezes, no crackles, no rhonchi.  Normal respiratory effort.   Cardiovascular system: Regular rate rhythm no murmurs rubs or gallops.  No JVD.  No lower extremity edema.  Gastrointestinal system: Abdomen significantly distended, tight, high-pitched bowel sounds, some diffuse tenderness to palpation.  Central nervous system: Alert and oriented. No focal neurological deficits. Extremities: Symmetric 5 x 5 power. Skin: No rashes, lesions or ulcers Psychiatry: Judgement and insight appear normal. Mood & affect appropriate.     Data Reviewed: I have personally reviewed following labs and imaging studies  CBC: Recent Labs  Lab  08/11/20 0248 08/11/20 0248 08/12/20 0557 08/12/20 0557 08/13/20 0557 08/14/20 0501 08/15/20 0545 08/16/20 0505 08/17/20 0446  WBC 10.3   < > 10.5   < > 10.8* 9.4 10.7* 10.3 9.9  NEUTROABS 8.1*  --  8.9*  --  8.5* 7.3 7.2  --   --   HGB 8.0*   < > 7.7*   < > 8.4* 8.2* 9.1* 9.2* 9.1*  HCT 25.9*   < > 24.3*   < > 26.3* 26.6* 29.6* 29.8* 30.3*  MCV 78.2*   < > 77.9*   < > 76.0* 78.5* 78.5* 78.2* 80.4  PLT 250   < > 334   < > 387 380 449* 513* 465*   < > = values in this interval not displayed.    Basic Metabolic Panel: Recent Labs  Lab 08/12/20 0557 08/12/20 0557 08/13/20 0557 08/14/20 0501 08/15/20 0545 08/16/20 0505 08/17/20 0446  NA 138   < > 143 144 134* 141 139  K 2.8*   < > 2.7* 3.7 3.0* 3.6 4.1  CL 103   < > 106 108 104 111 112*  CO2 21*   < > 23 22  21* 21* 18*  GLUCOSE 160*   < > 135* 146* 135* 68* 96  BUN 18   < > 22* 29* 30* 24* 18  CREATININE 0.97   < > 1.14 1.06 1.08 0.91 0.84  CALCIUM 8.4*   < > 8.7* 8.9 8.5* 8.3* 8.5*  MG 1.7  --  1.9 2.0 1.9  --  1.8  PHOS  --   --   --   --   --   --  2.8   < > = values in this interval not displayed.    GFR: Estimated Creatinine Clearance: 105 mL/min (by C-G formula based on SCr of 0.84 mg/dL).  Liver Function Tests: Recent Labs  Lab 08/12/20 0557 08/12/20 0557 08/13/20 0557 08/14/20 0501 08/15/20 0545 08/16/20 0505 08/17/20 0446  AST 72*  --  83* 50* 31 22  --   ALT 50*  --  67* 60* 52* 39  --   ALKPHOS 92  --  97 91 88 90  --   BILITOT 0.9  --  0.5 0.5 0.5 0.5  --   PROT 6.5  --  6.6 6.5 6.6 6.4*  --   ALBUMIN 2.4*   < > 2.3* 2.5* 2.6* 2.6* 2.8*   < > = values in this interval not displayed.    CBG: Recent Labs  Lab 08/16/20 2138 08/17/20 0015 08/17/20 0431 08/17/20 0738 08/17/20 1202  GLUCAP 142* 106* 96 106* 159*     Recent Results (from the past 240 hour(s))  SARS Coronavirus 2 by RT PCR (hospital order, performed in [facility] hospital lab) Nasopharyngeal Nasopharyngeal Swab     Status: None   Collection Time: 08/08/20 12:04 PM   Specimen: Nasopharyngeal Swab  Result Value Ref Range Status   SARS Coronavirus 2 NEGATIVE NEGATIVE Final    Comment: (NOTE) SARS-CoV-2 target nucleic acids are NOT DETECTED.  The SARS-CoV-2 RNA is generally detectable in upper and lower respiratory specimens during the acute phase of infection. The lowest concentration of SARS-CoV-2 viral copies this assay can detect is 250 copies / mL. A negative result does not preclude SARS-CoV-2 infection and should not be used as the sole basis for treatment or other patient management decisions.  A negative result may occur with improper specimen collection / handling, submission of specimen other  than nasopharyngeal swab, presence of viral mutation(s) within the areas targeted by this  assay, and inadequate number of viral copies (<250 copies / mL). A negative result must be combined with clinical observations, patient history, and epidemiological information.  Fact Sheet for Patients:   StrictlyIdeas.no  Fact Sheet for Healthcare Providers: BankingDealers.co.za  This test is not yet approved or  cleared by the [place] FDA and has been authorized for detection and/or diagnosis of SARS-CoV-2 by FDA under an Emergency Use Authorization (EUA).  This EUA will remain in effect (meaning this test can be used) for the duration of the COVID-19 declaration under Section 564(b)(1) of the Act, 21 U.S.C. section 360bbb-3(b)(1), unless the authorization is terminated or revoked sooner.  Performed at [facility], [place] [address] [place], [place] [address]   Blood Culture (routine x 2)     Status: None   Collection Time: 08/10/20  8:08 PM   Specimen: BLOOD  Result Value Ref Range Status   Specimen Description   Final    BLOOD RIGHT ANTECUBITAL Performed at [place] [address] [place], [place] [address]    Special Requests   Final    BOTTLES DRAWN AEROBIC AND ANAEROBIC Blood Culture adequate volume Performed at [place] [address], [place], [place] [address]    Culture   Final    NO GROWTH 5 DAYS Performed at [facility], [place] [address], [place],  [address]    Report Status 08/15/2020 FINAL  Final  Respiratory Panel by RT PCR (Flu A&B, Covid) - Nasopharyngeal Swab     Status: None   Collection Time: 08/10/20  8:08 PM   Specimen: Nasopharyngeal Swab  Result Value Ref Range Status   SARS Coronavirus 2 by RT PCR NEGATIVE NEGATIVE Final    Comment: (NOTE) SARS-CoV-2 target nucleic acids are NOT DETECTED.  The SARS-CoV-2 RNA is generally detectable in upper respiratoy specimens during the acute phase of infection. The  lowest concentration of SARS-CoV-2 viral copies this assay can detect is 131 copies/mL. A negative result does not preclude SARS-Cov-2 infection and should not be used as the sole basis for treatment or other patient management decisions. A negative result may occur with  improper specimen collection/handling, submission of specimen other than nasopharyngeal swab, presence of viral mutation(s) within the areas targeted by this assay, and inadequate number of viral copies (<131 copies/mL). A negative result must be combined with clinical observations, patient history, and epidemiological information. The expected result is Negative.  Fact Sheet for Patients:  PinkCheek.be  Fact Sheet for Healthcare Providers:  GravelBags.it  This test is no t yet approved or cleared by the [place] FDA and  has been authorized for detection and/or diagnosis of SARS-CoV-2 by FDA under an Emergency Use Authorization (EUA). This EUA will remain  in effect (meaning this test can be used) for the duration of the COVID-19 declaration under Section 564(b)(1) of the Act, 21 U.S.C. section 360bbb-3(b)(1), unless the authorization is terminated or revoked sooner.     Influenza A by PCR NEGATIVE NEGATIVE Final   Influenza B by PCR NEGATIVE NEGATIVE Final    Comment: (NOTE) The Xpert Xpress SARS-CoV-2/FLU/RSV assay is intended as an aid in  the diagnosis of influenza from Nasopharyngeal swab specimens and  should not be used as a sole basis for treatment. Nasal washings and  aspirates are unacceptable for Xpert Xpress SARS-CoV-2/FLU/RSV  testing.  Fact Sheet for Patients: PinkCheek.be  Fact Sheet for Healthcare Providers: GravelBags.it  This test is not yet approved or cleared by the [place] FDA and  has been authorized for detection and/or diagnosis of SARS-CoV-2 by  FDA under  an Emergency Use Authorization (EUA). This EUA will remain  in effect (meaning this test can be used) for the duration of the  Covid-19 declaration under Section 564(b)(1) of the Act, 21  U.S.C. section 360bbb-3(b)(1), unless the authorization is  terminated or revoked. Performed at [facility], [place] [address], [place], [place] [address]   Urine culture     Status: Abnormal   Collection Time: 08/11/20  1:00 AM   Specimen: In/Out Cath Urine  Result Value Ref Range Status   Specimen Description   Final    IN/OUT CATH URINE Performed at [place] [address] [place], [place] [address]    Special Requests   Final    NONE Performed at [facility], [place] [address], [place], [place] [address]    Culture >=100,000 COLONIES/mL ESCHERICHIA COLI (A)  Final   Report Status 08/13/2020 FINAL  Final   Organism ID, Bacteria ESCHERICHIA COLI (A)  Final      Susceptibility   Escherichia coli - MIC*    AMPICILLIN 4 SENSITIVE Sensitive     CEFAZOLIN <=4 SENSITIVE Sensitive     CEFTRIAXONE <=0.25 SENSITIVE Sensitive     CIPROFLOXACIN <=0.25 SENSITIVE Sensitive     GENTAMICIN <=1 SENSITIVE Sensitive     IMIPENEM <=0.25 SENSITIVE Sensitive     NITROFURANTOIN <=16 SENSITIVE Sensitive     TRIMETH/SULFA <=20 SENSITIVE Sensitive     AMPICILLIN/SULBACTAM <=2 SENSITIVE Sensitive     PIP/TAZO <=4 SENSITIVE Sensitive     * >=100,000 COLONIES/mL ESCHERICHIA COLI  Blood Culture (routine x 2)     Status: None   Collection Time: 08/11/20  2:48 AM   Specimen: BLOOD  Result Value Ref Range Status   Specimen Description   Final    BLOOD RIGHT HAND Performed at [place] [address] [place], [place] [address]    Special Requests   Final    BOTTLES DRAWN AEROBIC AND ANAEROBIC Blood Culture adequate volume Performed at [place] [address] [place], [place] [address]    Culture   Final     NO GROWTH 5 DAYS Performed at [facility], [place] [address] [place], [place] [address]    Report Status 08/16/2020 FINAL  Final         Radiology Studies: DG Abd 1 View  Result Date: 08/17/2020 CLINICAL DATA:  Follow-up ileus. EXAM: ABDOMEN - 1 VIEW COMPARISON:  08/16/2020. FINDINGS: Diffuse colonic distention again noted without significant change. Small-bowel distention noted on today's exam. These findings are most consistent with adynamic ileus with possible progression. Colonic pneumatosis again noted no free air. IMPRESSION: Diffuse colonic distention again noted without significant change. Small-bowel distention noted on today's exam. These findings most consistent with adynamic ileus with possible progression. Colonic pneumatosis again noted. Electronically Signed   By: Marcello Moores  Register   On: 08/17/2020 10:06   DG Abd Portable 1V  Result Date: 08/16/2020 CLINICAL DATA:  Ileus. EXAM: PORTABLE ABDOMEN - 1 VIEW COMPARISON:  August 14, 2020. FINDINGS: Stable diffuse colonic dilatation is noted with stool seen in the right colon. No definite small bowel dilatation is noted. These findings are most consistent with colonic ileus. No abnormal calcifications are noted. IMPRESSION: Stable diffuse colonic dilatation  is noted most consistent with colonic ileus. Electronically Signed   By: Marijo Conception M.D.   On: 08/16/2020 10:02        Scheduled Meds: . insulin glargine  7 Units Subcutaneous QHS  . metoCLOPramide (REGLAN) injection  5 mg Intravenous Q8H  . metoprolol tartrate  7.5 mg Intravenous Q6H   Continuous Infusions: . 0.9 % NaCl with KCl 40 mEq / L 100 mL/hr at 08/17/20 1116  .  ceFAZolin (ANCEF) IV 1 g (08/17/20 0620)  . heparin 850 Units/hr (08/16/20 2340)     LOS: 5 days    Time spent: 35 minutes    Irine Seal, MD [facility]   To contact the attending provider between 7A-7P or the covering provider during after hours 7P-7A, please log into  the web site [email or web address] and access using universal [place] password for that web site. If you do not have the password, please call the hospital operator.  08/17/2020, 1:14 PM

## 2020-08-17 NOTE — Progress Notes (Signed)
Subjective: No acute events.  He reports having a large bowel movement.  Objective: Vital signs in last 24 hours: Temp:  [97.5 F (36.4 C)-98.1 F (36.7 C)] 97.5 F (36.4 C) (09/30 0429) Pulse Rate:  [79-94] 88 (09/30 0429) Resp:  [16-20] 20 (09/30 0429) BP: (147-189)/(103-130) 163/116 (09/30 0429) SpO2:  [97 %-100 %] 97 % (09/30 0429) Last BM Date: 08/15/20  Intake/Output from previous day: 09/29 0701 - 09/30 0700 In: 1532.2 [I.V.:1482.2; IV Piggyback:50] Out: 1325 [FTDDU:2025] Intake/Output this shift: No intake/output data recorded.  General appearance: alert and no distress Resp: clear to auscultation bilaterally Cardio: regular rate and rhythm GI: distended, tympanic, firm, scant bowel sounds, nontender Extremities: extremities normal, atraumatic, no cyanosis or edema  Lab Results: Recent Labs    08/15/20 0545 08/16/20 0505 08/17/20 0446  WBC 10.7* 10.3 9.9  HGB 9.1* 9.2* 9.1*  HCT 29.6* 29.8* 30.3*  PLT 449* 513* 465*   BMET Recent Labs    08/15/20 0545 08/16/20 0505 08/17/20 0446  NA 134* 141 139  K 3.0* 3.6 4.1  CL 104 111 112*  CO2 21* 21* 18*  GLUCOSE 135* 68* 96  BUN 30* 24* 18  CREATININE 1.08 0.91 0.84  CALCIUM 8.5* 8.3* 8.5*   LFT Recent Labs    08/16/20 0505 08/16/20 0505 08/17/20 0446  PROT 6.4*  --   --   ALBUMIN 2.6*   < > 2.8*  AST 22  --   --   ALT 39  --   --   ALKPHOS 90  --   --   BILITOT 0.5  --   --    < > = values in this interval not displayed.   PT/INR No results for input(s): LABPROT, INR in the last 72 hours. Hepatitis Panel No results for input(s): HEPBSAG, HCVAB, HEPAIGM, HEPBIGM in the last 72 hours. C-Diff No results for input(s): CDIFFTOX in the last 72 hours. Fecal Lactopherrin No results for input(s): FECLLACTOFRN in the last 72 hours.  Studies/Results: DG Abd Portable 1V  Result Date: 08/16/2020 CLINICAL DATA:  Ileus. EXAM: PORTABLE ABDOMEN - 1 VIEW COMPARISON:  August 14, 2020. FINDINGS: Stable  diffuse colonic dilatation is noted with stool seen in the right colon. No definite small bowel dilatation is noted. These findings are most consistent with colonic ileus. No abnormal calcifications are noted. IMPRESSION: Stable diffuse colonic dilatation is noted most consistent with colonic ileus. Electronically Signed   By: Marijo Conception M.D.   On: 08/16/2020 10:02    Medications:  Scheduled: . insulin glargine  7 Units Subcutaneous QHS  . metoCLOPramide (REGLAN) injection  5 mg Intravenous Q8H  . metoprolol tartrate  7.5 mg Intravenous Q6H   Continuous: . 0.9 % NaCl with KCl 40 mEq / L 100 mL/hr at 08/17/20 0041  .  ceFAZolin (ANCEF) IV 1 g (08/17/20 0620)  . heparin 850 Units/hr (08/16/20 2340)    Assessment/Plan: 1) Colonic ileus. 2) Benign pneumatosis.   The patient is documented to have bowel movements, but his abdomen is still firm and he has scan bowel sounds.  He is confirmed to be up in a chair and he has some mobility.  His potassium is corrected, but his magnesium is low.  There is no evidence of an acute abdomen.  A rectal tube is not very beneficial in these cases as the tube typically clogs very quickly or falls out of position.  Plan: 1) Correct magnesium. 2) Continue with mobility.  LOS: 5 days  Etai Copado D 08/17/2020, 7:09 AM

## 2020-08-17 NOTE — Progress Notes (Signed)
Physical Therapy Treatment Patient Details Name: [name] MRN: [id number] DOB: 07/06/1961 Today's Date: 08/17/2020    History of Present Illness 59 yo male admitted to [facility] on 9/24 with weakness, joint pain, difficulty urinating. Pt meeting SIRS criteria, possibly secondary to urinary infection. PMHx significant for chronic gouty arthritis of multiple joints, CKD II, IDDM, HTN, HLD, and chronic anemia. Admitted to [facility] 8/11-8/24 for AKI, LUE DVT, and joint pain, d/c SNF.    PT Comments    Patient making slow but steady progress with acute PT. Pt motivated to work and improve strength. He requires extended rest breaks to reduce SOB and recover between bed mobility and transfers. Patient performed multiple sit<>stands with RW and use Stedy to transfer to chair. Exercises for LE reviewed and pt completed with cues to count out loud to reduce tendency to hold breath. Patient will continue to benefit from skilled PT interventions, follow up recs remain appropriate; Acute PT will progress as able.   Follow Up Recommendations  SNF;Supervision/Assistance - 24 hour     Equipment Recommendations  None recommended by PT    Recommendations for Other Services       Precautions / Restrictions Precautions Precautions: Fall Restrictions Weight Bearing Restrictions: No    Mobility  Bed Mobility Overal bed mobility: Needs Assistance Bed Mobility: Rolling;Sidelying to Sit Rolling: Min assist Sidelying to sit: Min assist;HOB elevated       General bed mobility comments: Cues for use of bed rail to assist with rolling and with raising trunk upright. Min assist to complete raising trunk. HR elevated to 120's sitting EOB and decreased to 100's with seated rest, Sats >98% on RA.  Transfers Overall transfer level: Needs assistance Equipment used: Rolling walker (2 wheeled);Ambulation equipment used Transfers: Sit to/from Stand Sit to Stand: Min assist;+2 safety/equipment;+2 physical  assistance;From elevated surface         General transfer comment: 2x sit<>stand with RW from EOB, min assist +2 for power up and to steady. Stedy use for Sit<>Stand and pt transfered bed>chair (total assist).   Ambulation/Gait             General Gait Details: pt attempted lateral steps along EOB with RW, unable to clear feet off floor to step.    Stairs             Wheelchair Mobility    Modified Rankin (Stroke Patients Only)       Balance                                            Cognition Arousal/Alertness: Awake/alert Behavior During Therapy: Flat affect Overall Cognitive Status: Within Functional Limits for tasks assessed Area of Impairment: Following commands                       Following Commands: Follows one step commands with increased time;Follows multi-step commands with increased time       General Comments: pt cooperative throughout session, he c/o pulse oximeter bothering him, no other complaints.       Exercises General Exercises - Lower Extremity Ankle Circles/Pumps: AROM;Both;15 reps;Seated Quad Sets: AROM;Both;10 reps;Seated    General Comments        Pertinent Vitals/Pain Pain Assessment: No/denies pain Pain Intervention(s): Monitored during session;Repositioned    Home Living  Prior Function            PT Goals (current goals can now be found in the care plan section) Acute Rehab PT Goals Patient Stated Goal: get stronger PT Goal Formulation: With patient Time For Goal Achievement: 08/26/20 Potential to Achieve Goals: Fair Progress towards PT goals: Progressing toward goals    Frequency    Min 2X/week      PT Plan Current plan remains appropriate    Co-evaluation              AM-PAC PT "6 Clicks" Mobility   Outcome Measure  Help needed turning from your back to your side while in a flat bed without using bedrails?: A Little Help needed  moving from lying on your back to sitting on the side of a flat bed without using bedrails?: A Little Help needed moving to and from a bed to a chair (including a wheelchair)?: Total Help needed standing up from a chair using your arms (e.g., wheelchair or bedside chair)?: A Lot Help needed to walk in hospital room?: Total Help needed climbing 3-5 steps with a railing? : Total 6 Click Score: 11    End of Session Equipment Utilized During Treatment: Gait belt Activity Tolerance: Patient tolerated treatment well Patient left: in chair;with call bell/phone within reach;with chair alarm set;with family/visitor present Nurse Communication: Mobility status;Need for lift equipment PT Visit Diagnosis: Muscle weakness (generalized) (M62.81);Other abnormalities of gait and mobility (R26.89)     Time: 0102-7253 PT Time Calculation (min) (ACUTE ONLY): 29 min  Charges:  $Therapeutic Exercise: 8-22 mins $Therapeutic Activity: 8-22 mins                    Verner Mould, DPT Acute Rehabilitation Services  Office [phone] Pager [phone]  08/17/2020 2:27 PM

## 2020-08-17 NOTE — Progress Notes (Signed)
[place] for IV heparin  Indication: history of DVT  Allergies  Allergen Reactions  . Hctz [Hydrochlorothiazide]     Hx of gout; uric acid 14.1 on 03/13/20 Acute gout L elbow 8/11-8/24/21    Patient Measurements: Height: 5\' 5"  (165.1 cm) Weight: 106.2 kg (234 lb 2.1 oz) IBW/kg (Calculated) : 61.5 Heparin Dosing Weight: 85.7 kg  Vital Signs: Temp: 98 F (36.7 C) (09/30 1114) Temp Source: Oral (09/30 1114) BP: 172/116 (09/30 1114) Pulse Rate: 93 (09/30 1114)  Labs: Recent Labs    08/15/20 0545 08/15/20 1217 08/16/20 0505 08/16/20 1344 08/17/20 0446  HGB 9.1*  --  9.2*  --  9.1*  HCT 29.6*  --  29.8*  --  30.3*  PLT 449*  --  513*  --  465*  APTT 93*   < > 116* 84* 86*  HEPARINUNFRC 1.94*  --  1.30*  --  0.80*  CREATININE 1.08  --  0.91  --  0.84   < > = values in this interval not displayed.    Estimated Creatinine Clearance: 105 mL/min (by C-G formula based on SCr of 0.84 mg/dL).   Assessment: 60 y/oM with PMH of DVT (07/03/2020) on Apixaban PTA admitted on 08/10/2020 with SIRS. Given worsening abdominal distention, CT abd pelvis done today, which revealed a colonic ileus with pneumatosis of the ascending and hepatic flexure of the colon. PO meds held. Pharmacy consulted for IV heparin dosing while Apixaban on hold. Last dose of Apixaban 5mg  PO given this morning at 0921. Note that recent Apixaban use may falsely elevate heparin levels.   Baseline labs: Hg low at 8.2, pltc WNL, Heparin level >2.2, INR 1.5, aPTT 39 se, Scr 1.08  Today, 08/17/2020:  AM aPTT remains therapeutic on 850 units/hr  Heparin level still elevated secondary to DOAC interaction but trending down  Hg low but stable, pltc increased  No bleeding or infusion related concerns reported by RN  Still with unresolved ileus, unable to resume Eliquis  Goal of Therapy:  aPTT 66-102 seconds Heparin level 0.3-0.7 units/ml Monitor platelets by anticoagulation  protocol: Yes   Plan:   Continue heparin infusion at 850 units/hr   Daily CBC, aPTT, and heparin level until DOAC effects on HL have dissipated  Monitor closely for s/sx of bleeding, ability to resume Eliquis  Reuel Boom, PharmD, BCPS [phone] 08/17/2020, 12:09 PM

## 2020-08-17 NOTE — Progress Notes (Addendum)
    CC: abdominal distension  Subjective: Says he feels better, but minimal change in his abdominal exam.   Objective: Vital signs in last 24 hours: Temp:  [97.5 F (36.4 C)-98.1 F (36.7 C)] 97.5 F (36.4 C) (09/30 0429) Pulse Rate:  [79-94] 88 (09/30 0429) Resp:  [16-20] 20 (09/30 0429) BP: (147-189)/(103-130) 163/116 (09/30 0429) SpO2:  [97 %-100 %] 97 % (09/30 0429) Last BM Date: 08/15/20 NPO 1532 IV Stool x 1 Afebrile, diastolic BP 510-258 HR in thge 80's- 90's  Labs OK K+ 4.1,  mag 1.8 WBC 9.9 H/H 9.1/30.3      Intake/Output from previous day: 09/29 0701 - 09/30 0700 In: 1532.2 [I.V.:1482.2; IV Piggyback:50] Out: 5277 [Urine:1325] Intake/Output this shift: Total I/O In: 0  Out: 400 [Urine:400]  General appearance: alert, cooperative and no distress GI: still very distended,  he may be a little softer than he was 48 hours ago, but not much.  High pitched bowel sounds, having some BM's.    Lab Results:  Recent Labs    08/16/20 0505 08/17/20 0446  WBC 10.3 9.9  HGB 9.2* 9.1*  HCT 29.8* 30.3*  PLT 513* 465*    BMET Recent Labs    08/16/20 0505 08/17/20 0446  NA 141 139  K 3.6 4.1  CL 111 112*  CO2 21* 18*  GLUCOSE 68* 96  BUN 24* 18  CREATININE 0.91 0.84  CALCIUM 8.3* 8.5*   PT/INR No results for input(s): LABPROT, INR in the last 72 hours.  Recent Labs  Lab 08/12/20 0557 08/12/20 0557 08/13/20 0557 08/14/20 0501 08/15/20 0545 08/16/20 0505 08/17/20 0446  AST 72*  --  83* 50* 31 22  --   ALT 50*  --  67* 60* 52* 39  --   ALKPHOS 92  --  97 91 88 90  --   BILITOT 0.9  --  0.5 0.5 0.5 0.5  --   PROT 6.5  --  6.6 6.5 6.6 6.4*  --   ALBUMIN 2.4*   < > 2.3* 2.5* 2.6* 2.6* 2.8*   < > = values in this interval not displayed.     Lipase     Component Value Date/Time   LIPASE 153 (H) 06/14/2011 2309     Medications: . insulin glargine  7 Units Subcutaneous QHS  . metoCLOPramide (REGLAN) injection  5 mg Intravenous Q8H  .  metoprolol tartrate  7.5 mg Intravenous Q6H    Assessment/Plan Gout Osteoarthritis Type 2 diabetes - SSI Left upper extremity DVT on anticoagulation - heparin gtt Anemia - hgb 9.2, stable Hypokalemia/Hypomagnesemia - goal K >4.0 and Mg >2.0 to maximize bowel function    Colonic ileus  - patient still very distended this AM, although reports having a large BM yesterday and is passing flatus - repeat KUB this AM - may need GI decompression - encourage mobilization as able  - no indication for emergency surgical intervention at this time  FEN: IV fluids/n.p.o. ID: Keflex 9/25-9/27; ceftriaxone x1 9/27 DVT: Heparin drip Follow-up: TBD   Plan:  No acute surgical need at this time.  If he needed surgery he would get some form of colectomy, and then an ileostomy.  Unless her perforates or develops ischemia there is not surgical treatment.    LOS: 5 days    [name] 08/17/2020 Please see Amion

## 2020-08-18 ENCOUNTER — Inpatient Hospital Stay (HOSPITAL_COMMUNITY): Payer: Medicare HMO

## 2020-08-18 DIAGNOSIS — I10 Essential (primary) hypertension: Secondary | ICD-10-CM | POA: Diagnosis not present

## 2020-08-18 DIAGNOSIS — I82622 Acute embolism and thrombosis of deep veins of left upper extremity: Secondary | ICD-10-CM | POA: Diagnosis not present

## 2020-08-18 DIAGNOSIS — M109 Gout, unspecified: Secondary | ICD-10-CM

## 2020-08-18 DIAGNOSIS — N182 Chronic kidney disease, stage 2 (mild): Secondary | ICD-10-CM | POA: Diagnosis not present

## 2020-08-18 DIAGNOSIS — R778 Other specified abnormalities of plasma proteins: Secondary | ICD-10-CM | POA: Diagnosis not present

## 2020-08-18 LAB — BASIC METABOLIC PANEL
Anion gap: 12 (ref 5–15)
BUN: 17 mg/dL (ref 6–20)
CO2: 16 mmol/L — ABNORMAL LOW (ref 22–32)
Calcium: 8.6 mg/dL — ABNORMAL LOW (ref 8.9–10.3)
Chloride: 112 mmol/L — ABNORMAL HIGH (ref 98–111)
Creatinine, Ser: 0.85 mg/dL (ref 0.61–1.24)
GFR calc Af Amer: >60 mL/min (ref >60)
GFR calc non Af Amer: >60 mL/min (ref >60)
Glucose, Bld: 126 mg/dL — ABNORMAL HIGH (ref 70–99)
Potassium: 4.6 mmol/L (ref 3.5–5.1)
Sodium: 140 mmol/L (ref 135–145)

## 2020-08-18 LAB — CBC
HCT: 32.7 % — ABNORMAL LOW (ref 39.0–52.0)
Hemoglobin: 9.7 g/dL — ABNORMAL LOW (ref 13.0–17.0)
MCH: 24 pg — ABNORMAL LOW (ref 26.0–34.0)
MCHC: 29.7 g/dL — ABNORMAL LOW (ref 30.0–36.0)
MCV: 80.7 fL (ref 80.0–100.0)
Platelets: 488 10*3/uL — ABNORMAL HIGH (ref 150–400)
RBC: 4.05 MIL/uL — ABNORMAL LOW (ref 4.22–5.81)
RDW: 22.7 % — ABNORMAL HIGH (ref 11.5–15.5)
WBC: 9.7 10*3/uL (ref 4.0–10.5)
nRBC: 0.8 % — ABNORMAL HIGH (ref 0.0–0.2)

## 2020-08-18 LAB — LACTIC ACID, PLASMA: Lactic Acid, Venous: 1.3 mmol/L (ref 0.5–1.9)

## 2020-08-18 LAB — HEPARIN LEVEL (UNFRACTIONATED)
Heparin Unfractionated: 0.9 IU/mL — ABNORMAL HIGH (ref 0.30–0.70)
Heparin Unfractionated: 0.69 IU/mL (ref 0.30–0.70)

## 2020-08-18 LAB — GLUCOSE, CAPILLARY
Glucose-Capillary: 117 mg/dL — ABNORMAL HIGH (ref 70–99)
Glucose-Capillary: 120 mg/dL — ABNORMAL HIGH (ref 70–99)
Glucose-Capillary: 123 mg/dL — ABNORMAL HIGH (ref 70–99)
Glucose-Capillary: 135 mg/dL — ABNORMAL HIGH (ref 70–99)
Glucose-Capillary: 160 mg/dL — ABNORMAL HIGH (ref 70–99)
Glucose-Capillary: 179 mg/dL — ABNORMAL HIGH (ref 70–99)

## 2020-08-18 LAB — APTT: aPTT: 120 seconds — ABNORMAL HIGH (ref 24–36)

## 2020-08-18 LAB — MAGNESIUM: Magnesium: 2.3 mg/dL (ref 1.7–2.4)

## 2020-08-18 MED ORDER — SODIUM BICARBONATE 8.4 % IV SOLN
INTRAVENOUS | Status: DC
  Filled 2020-08-18 (×6): qty 1000

## 2020-08-18 MED ORDER — HEPARIN (PORCINE) 25000 UT/250ML-% IV SOLN
650.0000 [IU]/h | INTRAVENOUS | Status: DC
  Administered 2020-08-18: 650 [IU]/h via INTRAVENOUS
  Filled 2020-08-18: qty 250

## 2020-08-18 MED ORDER — SODIUM CHLORIDE 0.9 % IV SOLN
Freq: Once | INTRAVENOUS | Status: AC
  Filled 2020-08-18: qty 2

## 2020-08-18 NOTE — Progress Notes (Signed)
Second RN attempted to place NG tube but was unsuccessful. MD notified. Will continue to monitor.

## 2020-08-18 NOTE — Progress Notes (Signed)
Pharmacy - IV heparin  Assessment:    Please see note from Lynnae Prude, PharmD earlier today for full details.  Briefly, 59 y.o. male on IV heparin for PMH DVT while unable to take Eliquis d/t severe colonic ileus. Heparin rate reduced earlier today for elevated level.   Most recent heparin level therapeutic at 0.69 on 650 units/hr  No bleeding or infusion issues per RN  Plan:   Continue heparin at 650 units/hr - I suspect heparin level will continue to decrease slightly at new rate  Recheck confirmatory level in AM  Reuel Boom, PharmD, BCPS [phone] 08/18/2020, 2:19 PM

## 2020-08-18 NOTE — Progress Notes (Signed)
Update given to patients family member, [place].

## 2020-08-18 NOTE — Plan of Care (Signed)
Problem: Consults Goal: RH ADOLESCENT PATIENT EDUCATION Description: Description: See Patient Education module for education specifics. Outcome: Progressing Goal: Skin Care Protocol Initiated - if Braden Score 18 or less Description: If consults are not indicated, leave blank or document N/A Outcome: Progressing Goal: Nutrition Consult-if indicated Outcome: Progressing Goal: Diabetes Guidelines if Diabetic/Glucose > 140 Description: If diabetic or lab glucose is > 140 mg/dl - Initiate Diabetes/Hyperglycemia Guidelines & Document Interventions  Outcome: Progressing   Problem: RH BOWEL ELIMINATION Goal: RH STG MANAGE BOWEL WITH ASSISTANCE Description: STG Manage Bowel with Assistance. Outcome: Progressing Goal: RH STG MANAGE BOWEL W/MEDICATION W/ASSISTANCE Description: STG Manage Bowel with Medication with Assistance. Outcome: Progressing   Problem: RH BLADDER ELIMINATION Goal: RH STG MANAGE BLADDER WITH ASSISTANCE Description: STG Manage Bladder With Assistance Outcome: Progressing Goal: RH STG MANAGE BLADDER WITH MEDICATION WITH ASSISTANCE Description: STG Manage Bladder With Medication With Assistance. Outcome: Progressing Goal: RH STG MANAGE BLADDER WITH EQUIPMENT WITH ASSISTANCE Description: STG Manage Bladder With Equipment With Assistance Outcome: Progressing   Problem: RH SKIN INTEGRITY Goal: RH STG SKIN FREE OF INFECTION/BREAKDOWN Outcome: Progressing Goal: RH STG MAINTAIN SKIN INTEGRITY WITH ASSISTANCE Description: STG Maintain Skin Integrity With Assistance. Outcome: Progressing Goal: RH STG ABLE TO PERFORM INCISION/WOUND CARE W/ASSISTANCE Description: STG Able To Perform Incision/Wound Care With Assistance. Outcome: Progressing   Problem: RH SAFETY Goal: RH STG ADHERE TO SAFETY PRECAUTIONS W/ASSISTANCE/DEVICE Description: STG Adhere to Safety Precautions With Assistance/Device. Outcome: Progressing Goal: RH STG DECREASED RISK OF FALL WITH  ASSISTANCE Description: STG Decreased Risk of Fall With Assistance. Outcome: Progressing   Problem: RH COGNITION-NURSING Goal: RH STG USES MEMORY AIDS/STRATEGIES W/ASSIST TO PROBLEM SOLVE Description: STG Uses Memory Aids/Strategies With Assistance to Problem Solve. Outcome: Progressing Goal: RH STG ANTICIPATES NEEDS/CALLS FOR ASSIST W/ASSIST/CUES Description: STG Anticipates Needs/Calls for Assist With Assistance/Cues. Outcome: Progressing   Problem: RH PAIN MANAGEMENT Goal: RH STG PAIN MANAGED AT OR BELOW PT'S PAIN GOAL Outcome: Progressing   Problem: RH KNOWLEDGE DEFICIT Goal: RH STG INCREASE KNOWLEDGE OF DIABETES Outcome: Progressing Goal: RH STG INCREASE KNOWLEDGE OF HYPERTENSION Outcome: Progressing Goal: RH STG INCREASE KNOWLEDGE OF DYSPHAGIA/FLUID INTAKE Outcome: Progressing Goal: RH STG INCREASE KNOWLEGDE OF HYPERLIPIDEMIA Outcome: Progressing Goal: RH STG INCREASE KNOWLEDGE OF STROKE PROPHYLAXIS Outcome: Progressing   Problem: Consults Goal: RH GENERAL PATIENT EDUCATION Description: See Patient Education module for education specifics. Outcome: Progressing Goal: Skin Care Protocol Initiated - if Braden Score 18 or less Description: If consults are not indicated, leave blank or document N/A Outcome: Progressing Goal: Nutrition Consult-if indicated Outcome: Progressing Goal: Diabetes Guidelines if Diabetic/Glucose > 140 Description: If diabetic or lab glucose is > 140 mg/dl - Initiate Diabetes/Hyperglycemia Guidelines & Document Interventions  Outcome: Progressing   Problem: RH BOWEL ELIMINATION Goal: RH STG MANAGE BOWEL WITH ASSISTANCE Description: STG Manage Bowel with Assistance. Outcome: Progressing Goal: RH STG MANAGE BOWEL W/MEDICATION W/ASSISTANCE Description: STG Manage Bowel with Medication with Assistance. Outcome: Progressing   Problem: RH BLADDER ELIMINATION Goal: RH STG MANAGE BLADDER WITH ASSISTANCE Description: STG Manage Bladder With  Assistance Outcome: Progressing Goal: RH STG MANAGE BLADDER WITH MEDICATION WITH ASSISTANCE Description: STG Manage Bladder With Medication With Assistance. Outcome: Progressing Goal: RH STG MANAGE BLADDER WITH EQUIPMENT WITH ASSISTANCE Description: STG Manage Bladder With Equipment With Assistance Outcome: Progressing   Problem: RH SKIN INTEGRITY Goal: RH STG SKIN FREE OF INFECTION/BREAKDOWN Outcome: Progressing Goal: RH STG MAINTAIN SKIN INTEGRITY WITH ASSISTANCE Description: STG Maintain Skin Integrity With Assistance. Outcome: Progressing Goal: RH STG ABLE TO PERFORM INCISION/WOUND CARE  W/ASSISTANCE Description: STG Able To Perform Incision/Wound Care With Assistance. Outcome: Progressing   Problem: RH SAFETY Goal: RH STG ADHERE TO SAFETY PRECAUTIONS W/ASSISTANCE/DEVICE Description: STG Adhere to Safety Precautions With Assistance/Device. Outcome: Progressing Goal: RH STG DECREASED RISK OF FALL WITH ASSISTANCE Description: STG Decreased Risk of Fall With Assistance. Outcome: Progressing   Problem: RH PAIN MANAGEMENT Goal: RH STG PAIN MANAGED AT OR BELOW PT'S PAIN GOAL Outcome: Progressing   Problem: RH KNOWLEDGE DEFICIT GENERAL Goal: RH STG INCREASE KNOWLEDGE OF SELF CARE AFTER HOSPITALIZATION Outcome: Progressing   Problem: RH Pre-functional/Other (Specify) Goal: RH LTG Pre-functional (Specify) Outcome: Progressing Goal: RH LTG Interdisciplinary (Specify) 1 Description: RH LTG Interdisciplinary (Specify)1 Outcome: Progressing Goal: RH LTG Interdisciplinary (Specify) 2 Description: RH LTG Interdisciplinary (Specify) 2  Outcome: Progressing   Problem: Education: Goal: Ability to describe self-care measures that may prevent or decrease complications (Diabetes Survival Skills Education) will improve Outcome: Progressing Goal: Individualized Educational Video(s) Outcome: Progressing   Problem: Coping: Goal: Ability to adjust to condition or change in health will  improve Outcome: Progressing   Problem: Fluid Volume: Goal: Ability to maintain a balanced intake and output will improve Outcome: Progressing   Problem: Health Behavior/Discharge Planning: Goal: Ability to identify and utilize available resources and services will improve Outcome: Progressing Goal: Ability to manage health-related needs will improve Outcome: Progressing   Problem: Metabolic: Goal: Ability to maintain appropriate glucose levels will improve Outcome: Progressing   Problem: Nutritional: Goal: Maintenance of adequate nutrition will improve Outcome: Progressing Goal: Progress toward achieving an optimal weight will improve Outcome: Progressing   Problem: Skin Integrity: Goal: Risk for impaired skin integrity will decrease Outcome: Progressing   Problem: Tissue Perfusion: Goal: Adequacy of tissue perfusion will improve Outcome: Progressing

## 2020-08-18 NOTE — Progress Notes (Signed)
PROGRESS NOTE    [name]  MGN:[id number] DOB: 05/01/1961 DOA: 08/10/2020 PCP: Charlott Rakes, MD    Chief Complaint  Patient presents with  . Leg Swelling  . Medical Clearance    rehab placement     Brief Narrative:  59 year old black male with known history of IDDM since 2012, HTN, chronic gout Prior scrotal abscess Rx 2017 drained at that time Severe onychomycosis Severe tricompartmental osteoarthritis both knees He is status post bilateral arthroscopies with meniscectomies by Dr. Berenice Primas in 2017 He has had work-up in the past found to be ANA negative RF negative CCP negative and felt to be a combination of his chronic gout and possible osteoarthritis He was recently hospitalized 8/11 through 8/24 with AKI found at that time to have left upper extremity DVT in addition to polyarticular gout He also came in with difficulty with urination found to have SIRS criteria with fever 102.8-CT chest no embolism no pneumonia urine analysis showed negative leukocytes negative nitrites However urine culture grew E. coli He has had intractable pain in upper extremities arms and swelling of his joints and he does not remember ever getting tapped on his joints but on review of chart as above it looks that he has been thoroughly worked up in the past for other rheumatological disease process and was supposed to follow-up with Dr. Estanislado Pandy rheumatology in the outpatient setting-it is unclear if this was ever done   Assessment & Plan:   Principal Problem:   SIRS (systemic inflammatory response syndrome) ([place]) Active Problems:   Essential hypertension   Gout   Iron deficiency anemia   Elevated troponin   Generalized weakness   Uncontrolled type 2 diabetes mellitus with hyperglycemia, without long-term current use of insulin (HCC)   Acute deep vein thrombosis (DVT) of brachial vein of left upper extremity (HCC)   Ileus (HCC)   Hypokalemia   CKD (chronic kidney disease) stage 2, GFR  60-89 ml/min   Polyarticular gout  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  Patient stated had a large bowel movement on 08/15/2020, passing flatus.  Patient stated had a large bowel movement today however per RN patient with mucus.  Patient also with some nausea today. Per RN patient has not had any significant bowel movement since 08/15/2020. Patient stated ambulated in hallways on 08/15/2020 with therapy.  Patient sitting up in bed.  Does not feel too well today.  Complaining of nausea.  Abdominal films done with stable diffuse colonic dilatation is noted most consistent with colonic ileus/adynamic ileus with progression and colonic pneumatosis..  CT abdomen and pelvis done 08/14/2020 with findings consistent with colonic ileus, pneumatosis of the ascending and hepatic flexure of the colon.  No associated colonic wall thickening or portal venous gas.  Normal small bowel without obstruction.  Hepatic steatosis.  Bilateral renal parenchymal atrophy with lobular contours.  Mild bladder wall thickening.  Patient with no significant change with abdominal distention.  Abdominal films this morning with persistent adynamic ileus with diffuse gas distention of large and small bowel.  Patient seen by GI and NG tube ordered however unable to be placed per RN.  Per RN GI to come back to try to place NG tube.  General surgery following, and recommending mobilization and possible GI decompression.  General surgery has ordered some neostigmine.  GI following.  Keep potassium > 4, magnesium > 2.  Currently n.p.o.  Supportive care.  GI following.  2.  Polyarticular gout with osteoarthritis It is noted that patient had previous work-up with rheumatoid factor and ANA which were negative.  Steroids currently on hold.  Allopurinol discontinued on admission.  Colchicine on  hold.  Likely needs SNF.  Will need outpatient follow-up with rheumatology as previously scheduled.  3.  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  Patient noted to have been on 70/30 insulin 50 units twice daily.  CBG was 64 the morning of 08/16/2020 and as such Lantus changed to 7 units nightly.  CBG at 123 this morning.  Sliding scale insulin.  On IV Reglan.  Follow.   4.  Hypokalemia/hypomagnesemia Potassium currently at 4.1.  Magnesium at 1.8.  Magnesium sulfate 2 g IV x1.  Keep potassium > 4, keep magnesium > 2.    5.  Left upper extremity DVT Was on apixaban twice daily.  Patient currently n.p.o. due to colonic ileus.  IV heparin for anticoagulation.  Once on a diet consistently will resume apixaban.  Follow.   6.  Iron deficiency anemia Status post IV Feraheme 08/12/2020.  Once on the diet will need to resume oral iron supplementation oral discharge.  Hemoglobin currently stable at 9.7.  Follow H&H. Transfusion threshold hemoglobin <7.  7.  Prior bilateral knee surgeries PT/OT.  Needs SNF placement.  8.  Chronic kidney disease stage II Stable.  Follow.  9.  E. coli urinary tract infection Urine cultures > 100,000 colonies of E. coli.  Was on Keflex.  Now on IV Ancef as patient currently n.p.o. continue IV Ancef due to abdominal distention, patient now with a metabolic acidosis, concern for ischemic bowel.  Follow.  10.  Metabolic acidosis Patient noted to be acidotic.  Concern for abdominal ischemia as patient with significant colonic ileus with no significant improvement.  Check a lactic acid level.  Place on a bicarb drip.  Follow.  11.  Severe onchogryphosis   DVT prophylaxis: Heparin Code Status: Full Family Communication: Updated patient and wife at bedside.  Disposition:   Status is: Inpatient    Dispo:  Patient From: Home  Planned Disposition: [place]  Expected discharge date: 08/22/20  Medically stable  for discharge: No        Consultants:   Gastroenterology: Dr. Collene Mares 08/14/2020  General surgery: Dr. Hassell Done 08/14/2020  Procedures:   CT angiogram chest 08/10/2020  CT abdomen and pelvis 08/14/2020  2D echo 08/11/2020  Abdominal films 08/18/2020, 08/17/2020, 08/16/2020, 08/14/2020,   Antimicrobials:   Keflex 08/12/2020>>>> 08/14/2020  IV Ancef 08/15/2020>>>>  IV Rocephin 08/14/2020>>> 08/15/2020   Subjective: Patient sitting up in bed.  States does not feel too well.  Complaining of nausea.  Stated had a bowel movement today however per RN just mucus noted.  No chest pain.  No shortness of breath.  Denies significant abdominal pain.  NG tube ordered per GI however unable to be placed by 2 RNs.   Objective: Vitals:   08/17/20 1735 08/17/20 2101 08/18/20 0000 08/18/20 0600  BP: (!) 144/109 (!) 160/114 (!) 149/117 (!) 162/117  Pulse: (!) 109 89 92 94  Resp: 18 18 18 18   Temp:  97.8 F (36.6 C)    TempSrc:  Oral    SpO2: 99% 100% 100% 100%  Weight:      Height:        Intake/Output Summary (Last 24 hours) at 08/18/2020 1041 Last data filed at 08/18/2020 0600 Gross per 24 hour  Intake 3717.05  ml  Output 1300 ml  Net 2417.05 ml   Filed Weights   08/11/20 0321  Weight: 106.2 kg    Examination:  General exam: NAD.  Looks uncomfortable. Respiratory system: Decreased breath sounds in the bases.  No wheezes, no crackles, no rhonchi.  Fair air movement.  Speaking in full sentences.  Cardiovascular system: Tachycardia.  No murmurs rubs or gallops.  No lower extremity edema.   Gastrointestinal system: Abdomen significantly distended, tight, high-pitched bowel sounds, nontender to palpation.  Central nervous system: Alert and oriented. No focal neurological deficits. Extremities: Symmetric 5 x 5 power. Skin: No rashes, lesions or ulcers Psychiatry: Judgement and insight appear normal. Mood & affect appropriate.     Data Reviewed: I have personally reviewed following labs and  imaging studies  CBC: Recent Labs  Lab 08/12/20 0557 08/12/20 0557 08/13/20 0557 08/13/20 0557 08/14/20 0501 08/15/20 0545 08/16/20 0505 08/17/20 0446 08/18/20 0506  WBC 10.5   < > 10.8*   < > 9.4 10.7* 10.3 9.9 9.7  NEUTROABS 8.9*  --  8.5*  --  7.3 7.2  --   --   --   HGB 7.7*   < > 8.4*   < > 8.2* 9.1* 9.2* 9.1* 9.7*  HCT 24.3*   < > 26.3*   < > 26.6* 29.6* 29.8* 30.3* 32.7*  MCV 77.9*   < > 76.0*   < > 78.5* 78.5* 78.2* 80.4 80.7  PLT 334   < > 387   < > 380 449* 513* 465* 488*   < > = values in this interval not displayed.    Basic Metabolic Panel: Recent Labs  Lab 08/13/20 0557 08/13/20 0557 08/14/20 0501 08/15/20 0545 08/16/20 0505 08/17/20 0446 08/18/20 0506  NA 143   < > 144 134* 141 139 140  K 2.7*   < > 3.7 3.0* 3.6 4.1 4.6  CL 106   < > 108 104 111 112* 112*  CO2 23   < > 22 21* 21* 18* 16*  GLUCOSE 135*   < > 146* 135* 68* 96 126*  BUN 22*   < > 29* 30* 24* 18 17  CREATININE 1.14   < > 1.06 1.08 0.91 0.84 0.85  CALCIUM 8.7*   < > 8.9 8.5* 8.3* 8.5* 8.6*  MG 1.9  --  2.0 1.9  --  1.8 2.3  PHOS  --   --   --   --   --  2.8  --    < > = values in this interval not displayed.    GFR: Estimated Creatinine Clearance: 103.8 mL/min (by C-G formula based on SCr of 0.85 mg/dL).  Liver Function Tests: Recent Labs  Lab 08/12/20 0557 08/12/20 0557 08/13/20 0557 08/14/20 0501 08/15/20 0545 08/16/20 0505 08/17/20 0446  AST 72*  --  83* 50* 31 22  --   ALT 50*  --  67* 60* 52* 39  --   ALKPHOS 92  --  97 91 88 90  --   BILITOT 0.9  --  0.5 0.5 0.5 0.5  --   PROT 6.5  --  6.6 6.5 6.6 6.4*  --   ALBUMIN 2.4*   < > 2.3* 2.5* 2.6* 2.6* 2.8*   < > = values in this interval not displayed.    CBG: Recent Labs  Lab 08/17/20 1714 08/17/20 2101 08/18/20 0050 08/18/20 0504 08/18/20 0732  GLUCAP 125* 106* 117* 120* 123*     Recent Results (from  the past 240 hour(s))  SARS Coronavirus 2 by RT PCR (hospital order, performed in [facility] hospital lab)  Nasopharyngeal Nasopharyngeal Swab     Status: None   Collection Time: 08/08/20 12:04 PM   Specimen: Nasopharyngeal Swab  Result Value Ref Range Status   SARS Coronavirus 2 NEGATIVE NEGATIVE Final    Comment: (NOTE) SARS-CoV-2 target nucleic acids are NOT DETECTED.  The SARS-CoV-2 RNA is generally detectable in upper and lower respiratory specimens during the acute phase of infection. The lowest concentration of SARS-CoV-2 viral copies this assay can detect is 250 copies / mL. A negative result does not preclude SARS-CoV-2 infection and should not be used as the sole basis for treatment or other patient management decisions.  A negative result may occur with improper specimen collection / handling, submission of specimen other than nasopharyngeal swab, presence of viral mutation(s) within the areas targeted by this assay, and inadequate number of viral copies (<250 copies / mL). A negative result must be combined with clinical observations, patient history, and epidemiological information.  Fact Sheet for Patients:   StrictlyIdeas.no  Fact Sheet for Healthcare Providers: BankingDealers.co.za  This test is not yet approved or  cleared by the [place] FDA and has been authorized for detection and/or diagnosis of SARS-CoV-2 by FDA under an Emergency Use Authorization (EUA).  This EUA will remain in effect (meaning this test can be used) for the duration of the COVID-19 declaration under Section 564(b)(1) of the Act, 21 U.S.C. section 360bbb-3(b)(1), unless the authorization is terminated or revoked sooner.  Performed at [facility], [place] [address] [place], [place] [address]   Blood Culture (routine x 2)     Status: None   Collection Time: 08/10/20  8:08 PM   Specimen: BLOOD  Result Value Ref Range Status   Specimen Description   Final    BLOOD RIGHT ANTECUBITAL Performed at [facility] [address] [place], [place] [address]    Special Requests   Final    BOTTLES DRAWN AEROBIC AND ANAEROBIC Blood Culture adequate volume Performed at [facility] [address], [place], [place] [address]    Culture   Final    NO GROWTH 5 DAYS Performed at [facility], [place] [address] [place],  [address]    Report Status 08/15/2020 FINAL  Final  Respiratory Panel by RT PCR (Flu A&B, Covid) - Nasopharyngeal Swab     Status: None   Collection Time: 08/10/20  8:08 PM   Specimen: Nasopharyngeal Swab  Result Value Ref Range Status   SARS Coronavirus 2 by RT PCR NEGATIVE NEGATIVE Final    Comment: (NOTE) SARS-CoV-2 target nucleic acids are NOT DETECTED.  The SARS-CoV-2 RNA is generally detectable in upper respiratoy specimens during the acute phase of infection. The lowest concentration of SARS-CoV-2 viral copies this assay can detect is 131 copies/mL. A negative result does not preclude SARS-Cov-2 infection and should not be used as the sole basis for treatment or other patient management decisions. A negative result may occur with  improper specimen collection/handling, submission of specimen other than nasopharyngeal swab, presence of viral mutation(s) within the areas targeted by this assay, and inadequate number of viral copies (<131 copies/mL). A negative result must be combined with clinical observations, patient history, and epidemiological information. The expected result is Negative.  Fact Sheet for Patients:  PinkCheek.be  Fact Sheet for Healthcare Providers:  GravelBags.it  This test is no t yet approved  or cleared by the [place] and  has been authorized for detection and/or diagnosis of SARS-CoV-2 by FDA under an Emergency Use Authorization (EUA). This EUA will remain  in effect (meaning this test can be used) for the duration of the COVID-19 declaration under  Section 564(b)(1) of the Act, 21 U.S.C. section 360bbb-3(b)(1), unless the authorization is terminated or revoked sooner.     Influenza A by PCR NEGATIVE NEGATIVE Final   Influenza B by PCR NEGATIVE NEGATIVE Final    Comment: (NOTE) The Xpert Xpress SARS-CoV-2/FLU/RSV assay is intended as an aid in  the diagnosis of influenza from Nasopharyngeal swab specimens and  should not be used as a sole basis for treatment. Nasal washings and  aspirates are unacceptable for Xpert Xpress SARS-CoV-2/FLU/RSV  testing.  Fact Sheet for Patients: PinkCheek.be  Fact Sheet for Healthcare Providers: GravelBags.it  This test is not yet approved or cleared by the [place] FDA and  has been authorized for detection and/or diagnosis of SARS-CoV-2 by  FDA under an Emergency Use Authorization (EUA). This EUA will remain  in effect (meaning this test can be used) for the duration of the  Covid-19 declaration under Section 564(b)(1) of the Act, 21  U.S.C. section 360bbb-3(b)(1), unless the authorization is  terminated or revoked. Performed at [facility], [place] [address] [place], [place] [address]   Urine culture     Status: Abnormal   Collection Time: 08/11/20  1:00 AM   Specimen: In/Out Cath Urine  Result Value Ref Range Status   Specimen Description   Final    IN/OUT CATH URINE Performed at [facility] [address], [place], [place] [address]    Special Requests   Final    NONE Performed at [facility], [place] [address] [place], [place] [address]    Culture >=100,000 COLONIES/mL ESCHERICHIA COLI (A)  Final   Report Status 08/13/2020 FINAL  Final   Organism ID, Bacteria ESCHERICHIA COLI (A)  Final      Susceptibility   Escherichia coli - MIC*    AMPICILLIN 4 SENSITIVE Sensitive     CEFAZOLIN <=4 SENSITIVE Sensitive     CEFTRIAXONE <=0.25 SENSITIVE Sensitive      CIPROFLOXACIN <=0.25 SENSITIVE Sensitive     GENTAMICIN <=1 SENSITIVE Sensitive     IMIPENEM <=0.25 SENSITIVE Sensitive     NITROFURANTOIN <=16 SENSITIVE Sensitive     TRIMETH/SULFA <=20 SENSITIVE Sensitive     AMPICILLIN/SULBACTAM <=2 SENSITIVE Sensitive     PIP/TAZO <=4 SENSITIVE Sensitive     * >=100,000 COLONIES/mL ESCHERICHIA COLI  Blood Culture (routine x 2)     Status: None   Collection Time: 08/11/20  2:48 AM   Specimen: BLOOD  Result Value Ref Range Status   Specimen Description   Final    BLOOD RIGHT HAND Performed at [facility] [address] [place], [place] [address]    Special Requests   Final    BOTTLES DRAWN AEROBIC AND ANAEROBIC Blood Culture adequate volume Performed at [facility] [address], [place], [place] [address]    Culture   Final    NO GROWTH 5 DAYS Performed at [facility], [place] [address] [place], [place] [address]    Report Status 08/16/2020 FINAL  Final         Radiology Studies: DG Abd 1 View  Result Date: 08/18/2020 CLINICAL DATA:  Ileus EXAM: ABDOMEN - 1 VIEW COMPARISON:  08/17/2020 FINDINGS: Diffuse gas distention of large and small bowel most consistent with adynamic ileus. No improvement since previous study. IMPRESSION: Persistent adynamic ileus. Electronically Signed   By: Lucienne Capers M.D.   On: 08/18/2020 05:48   DG Abd 1 View  Result Date: 08/17/2020 CLINICAL DATA:  Follow-up ileus. EXAM: ABDOMEN - 1 VIEW COMPARISON:  08/16/2020. FINDINGS: Diffuse colonic distention again noted without significant change. Small-bowel distention noted on today's exam. These findings are most consistent with adynamic ileus with possible progression. Colonic pneumatosis again noted no free air. IMPRESSION: Diffuse colonic distention again noted without significant change. Small-bowel distention noted on today's exam. These findings most consistent with adynamic ileus with possible progression.  Colonic pneumatosis again noted. Electronically Signed   By: Marcello Moores  Register   On: 08/17/2020 10:06        Scheduled Meds: . insulin glargine  7 Units Subcutaneous QHS  . metoCLOPramide (REGLAN) injection  5 mg Intravenous Q8H  . metoprolol tartrate  7.5 mg Intravenous Q6H   Continuous Infusions: .  ceFAZolin (ANCEF) IV 1 g (08/18/20 0603)  . dextrose 5 % 1,000 mL with potassium chloride 20 mEq, sodium bicarbonate 150 mEq infusion 100 mL/hr at 08/18/20 0935  . heparin 650 Units/hr (08/18/20 0855)     LOS: 6 days    Time spent: 40 minutes    Irine Seal, MD [facility]   To contact the attending provider between 7A-7P or the covering provider during after hours 7P-7A, please log into the web site [email or web address] and access using universal [place] password for that web site. If you do not have the password, please call the hospital operator.  08/18/2020, 10:41 AM

## 2020-08-18 NOTE — Progress Notes (Signed)
Attempted to place NG tube in right and left nares but met resistance on both sides. Pt cried out in pain. Dr. Benson Norway and Dr. Grandville Silos made aware. Awaiting new orders. Will continue to monitor.

## 2020-08-18 NOTE — Progress Notes (Addendum)
Subjective: No change.  He had a large bowel movement this AM.  Objective: Vital signs in last 24 hours: Temp:  [97.5 F (36.4 C)-98 F (36.7 C)] 97.8 F (36.6 C) (09/30 2101) Pulse Rate:  [89-109] 94 (10/01 0600) Resp:  [16-18] 18 (10/01 0600) BP: (140-172)/(109-117) 162/117 (10/01 0600) SpO2:  [99 %-100 %] 100 % (10/01 0600) Last BM Date: 08/15/20  Intake/Output from previous day: 09/30 0701 - 10/01 0700 In: 3717.1 [I.V.:3540.1; IV Piggyback:177] Out: 1700 [Urine:1700] Intake/Output this shift: No intake/output data recorded.  General appearance: alert and no distress Resp: clear to auscultation bilaterally Cardio: regular rate and rhythm GI: distended, tympanic, scant bowel sounds, firm, nontender Extremities: scds  Lab Results: Recent Labs    08/16/20 0505 08/17/20 0446 08/18/20 0506  WBC 10.3 9.9 9.7  HGB 9.2* 9.1* 9.7*  HCT 29.8* 30.3* 32.7*  PLT 513* 465* 488*   BMET Recent Labs    08/16/20 0505 08/17/20 0446 08/18/20 0506  NA 141 139 140  K 3.6 4.1 4.6  CL 111 112* 112*  CO2 21* 18* 16*  GLUCOSE 68* 96 126*  BUN 24* 18 17  CREATININE 0.91 0.84 0.85  CALCIUM 8.3* 8.5* 8.6*   LFT Recent Labs    08/16/20 0505 08/16/20 0505 08/17/20 0446  PROT 6.4*  --   --   ALBUMIN 2.6*   < > 2.8*  AST 22  --   --   ALT 39  --   --   ALKPHOS 90  --   --   BILITOT 0.5  --   --    < > = values in this interval not displayed.   PT/INR No results for input(s): LABPROT, INR in the last 72 hours. Hepatitis Panel No results for input(s): HEPBSAG, HCVAB, HEPAIGM, HEPBIGM in the last 72 hours. C-Diff No results for input(s): CDIFFTOX in the last 72 hours. Fecal Lactopherrin No results for input(s): FECLLACTOFRN in the last 72 hours.  Studies/Results: DG Abd 1 View  Result Date: 08/18/2020 CLINICAL DATA:  Ileus EXAM: ABDOMEN - 1 VIEW COMPARISON:  08/17/2020 FINDINGS: Diffuse gas distention of large and small bowel most consistent with adynamic ileus. No  improvement since previous study. IMPRESSION: Persistent adynamic ileus. Electronically Signed   By: Lucienne Capers M.D.   On: 08/18/2020 05:48   DG Abd 1 View  Result Date: 08/17/2020 CLINICAL DATA:  Follow-up ileus. EXAM: ABDOMEN - 1 VIEW COMPARISON:  08/16/2020. FINDINGS: Diffuse colonic distention again noted without significant change. Small-bowel distention noted on today's exam. These findings are most consistent with adynamic ileus with possible progression. Colonic pneumatosis again noted no free air. IMPRESSION: Diffuse colonic distention again noted without significant change. Small-bowel distention noted on today's exam. These findings most consistent with adynamic ileus with possible progression. Colonic pneumatosis again noted. Electronically Signed   By: Marcello Moores  Register   On: 08/17/2020 10:06   DG Abd Portable 1V  Result Date: 08/16/2020 CLINICAL DATA:  Ileus. EXAM: PORTABLE ABDOMEN - 1 VIEW COMPARISON:  August 14, 2020. FINDINGS: Stable diffuse colonic dilatation is noted with stool seen in the right colon. No definite small bowel dilatation is noted. These findings are most consistent with colonic ileus. No abnormal calcifications are noted. IMPRESSION: Stable diffuse colonic dilatation is noted most consistent with colonic ileus. Electronically Signed   By: Marijo Conception M.D.   On: 08/16/2020 10:02    Medications:  Scheduled: . insulin glargine  7 Units Subcutaneous QHS  . metoCLOPramide (REGLAN) injection  5 mg Intravenous Q8H  . metoprolol tartrate  7.5 mg Intravenous Q6H   Continuous: . 0.9 % NaCl with KCl 40 mEq / L 100 mL/hr at 08/18/20 0600  .  ceFAZolin (ANCEF) IV 1 g (08/18/20 0603)  . heparin      Assessment/Plan: 1) Small bowel and colonic ileus. 2) DM.   There is worsening of his ileus in that it now involves the small bowel.  Previously it was only a colonic ileus.  Yesterday's KUB did show as well as today's KUB.  He reports ambulating and passing  bowel movements.  Overall his electrolytes are within normal range or close to the normal range.  Plan: 1) Insert NG tube and hook up to low intermittent suctioning 2) Continue with mobility. 3) Dr. Ardis Hughs, [facility], will round on the patient this weekend.   ADDENDUM: 18 Fr NG tube successfully placed over a guidewire with nursing assistance.  LOS: 6 days   Detrice Cales D 08/18/2020, 7:20 AM

## 2020-08-18 NOTE — Progress Notes (Signed)
Pt had large liquid, green stool after administration of IV neostigmine. Pt became nauseous and had three episodes of green/brown emesis. Dr. Benson Norway aware and at bedside. At this time Dr. Benson Norway was able to place NG tube. Abdominal x-ray performed to confirm placement. NG tube connected to low intermitted wall suction. Zofran given. Pt states that he feels a little bit better than he before. Dr. Grandville Silos notified of status. Will continue to monitor patient.

## 2020-08-18 NOTE — Progress Notes (Signed)
PT Cancellation Note  Patient Details Name: [name] MRN: [id number] DOB: Nov 30, 1960   Cancelled Treatment:    Reason Eval/Treat Not Completed: Patient declined, no reason specified;Pain limiting ability to participate (Pt resting in bed, NG placed today. RN reports pt had a difficult morning. Patient declines OOB or bed level exercise today stating "not today...don't feel well". Will follow up at later date/time as schedule allows.)   Gwynneth Albright PT, DPT [place]  Office [phone] Pager [phone]  08/18/2020 3:22 PM

## 2020-08-18 NOTE — Progress Notes (Signed)
Subjective: Patient having some nausea.  No vomiting currently.  Doesn't have much abdominal pain, but somewhat uncomfortable.  ROS: See above, otherwise other systems negative  Objective: Vital signs in last 24 hours: Temp:  [97.5 F (36.4 C)-98 F (36.7 C)] 97.8 F (36.6 C) (09/30 2101) Pulse Rate:  [89-109] 94 (10/01 0600) Resp:  [16-18] 18 (10/01 0600) BP: (140-172)/(109-117) 162/117 (10/01 0600) SpO2:  [99 %-100 %] 100 % (10/01 0600) Last BM Date: 08/15/20  Intake/Output from previous day: 09/30 0701 - 10/01 0700 In: 3717.1 [I.V.:3540.1; IV Piggyback:177] Out: 1700 [Urine:1700] Intake/Output this shift: No intake/output data recorded.  PE: Abd: very distended and tympanitic, some BS, not overtly tender anywhere  Lab Results:  Recent Labs    08/17/20 0446 08/18/20 0506  WBC 9.9 9.7  HGB 9.1* 9.7*  HCT 30.3* 32.7*  PLT 465* 488*   BMET Recent Labs    08/17/20 0446 08/18/20 0506  NA 139 140  K 4.1 4.6  CL 112* 112*  CO2 18* 16*  GLUCOSE 96 126*  BUN 18 17  CREATININE 0.84 0.85  CALCIUM 8.5* 8.6*   PT/INR No results for input(s): LABPROT, INR in the last 72 hours. CMP     Component Value Date/Time   NA 140 08/18/2020 0506   NA 131 (A) 07/23/2020 0000   K 4.6 08/18/2020 0506   CL 112 (H) 08/18/2020 0506   CO2 16 (L) 08/18/2020 0506   GLUCOSE 126 (H) 08/18/2020 0506   BUN 17 08/18/2020 0506   BUN 36 (A) 07/23/2020 0000   CREATININE 0.85 08/18/2020 0506   CREATININE 0.95 10/18/2016 1640   CALCIUM 8.6 (L) 08/18/2020 0506   CALCIUM 9.0 12/28/2015 0726   PROT 6.4 (L) 08/16/2020 0505   PROT 7.3 03/13/2020 1420   ALBUMIN 2.8 (L) 08/17/2020 0446   ALBUMIN 4.7 03/13/2020 1420   AST 22 08/16/2020 0505   ALT 39 08/16/2020 0505   ALKPHOS 90 08/16/2020 0505   BILITOT 0.5 08/16/2020 0505   BILITOT 0.4 03/13/2020 1420   GFRNONAA >60 08/18/2020 0506   GFRNONAA 70 10/13/2013 1054   GFRAA >60 08/18/2020 0506   GFRAA 80 10/13/2013 1054   Lipase      Component Value Date/Time   LIPASE 153 (H) 06/14/2011 2309       Studies/Results: DG Abd 1 View  Result Date: 08/18/2020 CLINICAL DATA:  Ileus EXAM: ABDOMEN - 1 VIEW COMPARISON:  08/17/2020 FINDINGS: Diffuse gas distention of large and small bowel most consistent with adynamic ileus. No improvement since previous study. IMPRESSION: Persistent adynamic ileus. Electronically Signed   By: Lucienne Capers M.D.   On: 08/18/2020 05:48   DG Abd 1 View  Result Date: 08/17/2020 CLINICAL DATA:  Follow-up ileus. EXAM: ABDOMEN - 1 VIEW COMPARISON:  08/16/2020. FINDINGS: Diffuse colonic distention again noted without significant change. Small-bowel distention noted on today's exam. These findings are most consistent with adynamic ileus with possible progression. Colonic pneumatosis again noted no free air. IMPRESSION: Diffuse colonic distention again noted without significant change. Small-bowel distention noted on today's exam. These findings most consistent with adynamic ileus with possible progression. Colonic pneumatosis again noted. Electronically Signed   By: Marcello Moores  Register   On: 08/17/2020 10:06    Anti-infectives: Anti-infectives (From admission, onward)   Start     Dose/Rate Route Frequency Ordered Stop   08/15/20 1600  ceFAZolin (ANCEF) IVPB 1 g/50 mL premix        1 g 100 mL/hr over  30 Minutes Intravenous Every 8 hours 08/15/20 1429     08/14/20 1730  cefTRIAXone (ROCEPHIN) 1 g in sodium chloride 0.9 % 100 mL IVPB  Status:  Discontinued        1 g 200 mL/hr over 30 Minutes Intravenous Every 24 hours 08/14/20 1647 08/15/20 1429   08/12/20 1230  cephALEXin (KEFLEX) capsule 500 mg  Status:  Discontinued        500 mg Oral Every 12 hours 08/12/20 1139 08/14/20 1647       Assessment/Plan Gout Osteoarthritis Type 2 diabetes- SSI Left upper extremity DVT on anticoagulation- heparin gtt Anemia- hgb 9.2, stable Hypokalemia/Hypomagnesemia- goal K >4.0 and Mg >2.0 to maximize  bowel function   Colonic ileus - patient still very distended this AM, had BM yesterday -some nausea today.  GI has written for NGT -Discussed with Dr. Benson Norway and Dr. Hassell Done.  GI doesn't feel role for decompressive scope right now -will try neostigmine to see if this helps.  He is on tele currently. -no surgical indications at this point unless he perforates or has ischemia, which he does not have evidence of at this point -his lactic acid is normal  FEN: IV fluids/n.p.o. ID: Keflex 9/25-9/27; ceftriaxone x1 9/27 DVT: Heparin drip    LOS: 6 days    Henreitta Cea , [facility] 08/18/2020, 10:48 AM Please see Amion for pager number during day hours 7:00am-4:30pm or 7:00am -11:30am on weekends

## 2020-08-18 NOTE — Progress Notes (Signed)
[place] for IV heparin  Indication: history of DVT  Allergies  Allergen Reactions  . Hctz [Hydrochlorothiazide]     Hx of gout; uric acid 14.1 on 03/13/20 Acute gout L elbow 8/11-8/24/21    Patient Measurements: Height: 5\' 5"  (165.1 cm) Weight: 106.2 kg (234 lb 2.1 oz) IBW/kg (Calculated) : 61.5 Heparin Dosing Weight: 85.7 kg  Vital Signs: Temp: 97.8 F (36.6 C) (09/30 2101) Temp Source: Oral (09/30 2101) BP: 162/117 (10/01 0600) Pulse Rate: 94 (10/01 0600)  Labs: Recent Labs    08/16/20 0505 08/16/20 0505 08/16/20 1344 08/17/20 0446 08/18/20 0506  HGB 9.2*   < >  --  9.1* 9.7*  HCT 29.8*  --   --  30.3* 32.7*  PLT 513*  --   --  465* 488*  APTT 116*   < > 84* 86* 120*  HEPARINUNFRC 1.30*  --   --  0.80* 0.90*  CREATININE 0.91  --   --  0.84 0.85   < > = values in this interval not displayed.    Estimated Creatinine Clearance: 103.8 mL/min (by C-G formula based on SCr of 0.85 mg/dL).   Assessment: 50 y/oM with PMH of DVT (07/03/2020) on Apixaban PTA admitted on 08/10/2020 with SIRS. Given worsening abdominal distention, CT abd pelvis done today, which revealed a colonic ileus with pneumatosis of the ascending and hepatic flexure of the colon. PO meds held. Pharmacy consulted for IV heparin dosing while Apixaban on hold. Last dose of Apixaban 5mg  PO given this morning at 0921. Note that recent Apixaban use may falsely elevate heparin levels.   Baseline labs: Hg low at 8.2, pltc WNL, Heparin level >2.2, INR 1.5, aPTT 39 se, Scr 1.08  Today, 08/18/2020:  AM aPTT 120 sec- now SUPRAtherapeutic on 850 units/hr  Heparin level also elevated & increased from yesterday at 0.9- will change to adjusting heparin based on heparin levels going forward  Hg low but stable, pltc increased  No bleeding or infusion related concerns reported by RN  Still with unresolved ileus, unable to resume Eliquis  Goal of Therapy:  aPTT 66-102  seconds Heparin level 0.3-0.7 units/ml Monitor platelets by anticoagulation protocol: Yes   Plan:   Hold heparin x1 hours then resume heparin infusion at 650 units/hr   Check heparin level 6h after rate change  Daily CBC and heparin level   Monitor closely for s/sx of bleeding, ability to resume Eliquis  Netta Cedars, PharmD, BCPS 08/18/2020, 6:57 AM

## 2020-08-18 NOTE — Progress Notes (Signed)
RN noted QTc of 0.49 on the monitor. MD notified. See new orders. Will continue to monitor.

## 2020-08-19 ENCOUNTER — Inpatient Hospital Stay (HOSPITAL_COMMUNITY): Payer: Medicare HMO

## 2020-08-19 DIAGNOSIS — I10 Essential (primary) hypertension: Secondary | ICD-10-CM | POA: Diagnosis not present

## 2020-08-19 DIAGNOSIS — I82622 Acute embolism and thrombosis of deep veins of left upper extremity: Secondary | ICD-10-CM | POA: Diagnosis not present

## 2020-08-19 DIAGNOSIS — R6510 Systemic inflammatory response syndrome (SIRS) of non-infectious origin without acute organ dysfunction: Secondary | ICD-10-CM | POA: Diagnosis not present

## 2020-08-19 DIAGNOSIS — N182 Chronic kidney disease, stage 2 (mild): Secondary | ICD-10-CM | POA: Diagnosis not present

## 2020-08-19 DIAGNOSIS — N390 Urinary tract infection, site not specified: Secondary | ICD-10-CM

## 2020-08-19 DIAGNOSIS — R778 Other specified abnormalities of plasma proteins: Secondary | ICD-10-CM | POA: Diagnosis not present

## 2020-08-19 DIAGNOSIS — R561 Post traumatic seizures: Secondary | ICD-10-CM

## 2020-08-19 LAB — BASIC METABOLIC PANEL
Anion gap: 12 (ref 5–15)
BUN: 13 mg/dL (ref 6–20)
CO2: 23 mmol/L (ref 22–32)
Calcium: 8.5 mg/dL — ABNORMAL LOW (ref 8.9–10.3)
Chloride: 109 mmol/L (ref 98–111)
Creatinine, Ser: 0.77 mg/dL (ref 0.61–1.24)
GFR calc Af Amer: >60 mL/min (ref >60)
GFR calc non Af Amer: >60 mL/min (ref >60)
Glucose, Bld: 179 mg/dL — ABNORMAL HIGH (ref 70–99)
Potassium: 3.6 mmol/L (ref 3.5–5.1)
Sodium: 144 mmol/L (ref 135–145)

## 2020-08-19 LAB — GLUCOSE, CAPILLARY
Glucose-Capillary: 126 mg/dL — ABNORMAL HIGH (ref 70–99)
Glucose-Capillary: 149 mg/dL — ABNORMAL HIGH (ref 70–99)
Glucose-Capillary: 151 mg/dL — ABNORMAL HIGH (ref 70–99)
Glucose-Capillary: 156 mg/dL — ABNORMAL HIGH (ref 70–99)
Glucose-Capillary: 175 mg/dL — ABNORMAL HIGH (ref 70–99)
Glucose-Capillary: 178 mg/dL — ABNORMAL HIGH (ref 70–99)

## 2020-08-19 LAB — CBC
HCT: 30.2 % — ABNORMAL LOW (ref 39.0–52.0)
Hemoglobin: 9.1 g/dL — ABNORMAL LOW (ref 13.0–17.0)
MCH: 24.1 pg — ABNORMAL LOW (ref 26.0–34.0)
MCHC: 30.1 g/dL (ref 30.0–36.0)
MCV: 80.1 fL (ref 80.0–100.0)
Platelets: 425 10*3/uL — ABNORMAL HIGH (ref 150–400)
RBC: 3.77 MIL/uL — ABNORMAL LOW (ref 4.22–5.81)
RDW: 22.8 % — ABNORMAL HIGH (ref 11.5–15.5)
WBC: 9.4 10*3/uL (ref 4.0–10.5)
nRBC: 0.2 % (ref 0.0–0.2)

## 2020-08-19 LAB — HEPARIN LEVEL (UNFRACTIONATED): Heparin Unfractionated: 0.44 IU/mL (ref 0.30–0.70)

## 2020-08-19 LAB — MAGNESIUM: Magnesium: 2 mg/dL (ref 1.7–2.4)

## 2020-08-19 MED ORDER — POTASSIUM CHLORIDE 10 MEQ/100ML IV SOLN
10.0000 meq | INTRAVENOUS | Status: AC
  Administered 2020-08-19 (×4): 10 meq via INTRAVENOUS
  Filled 2020-08-19: qty 100

## 2020-08-19 NOTE — Progress Notes (Signed)
[place] for IV heparin  Indication: history of DVT  Allergies  Allergen Reactions  . Hctz [Hydrochlorothiazide]     Hx of gout; uric acid 14.1 on 03/13/20 Acute gout L elbow 8/11-8/24/21    Patient Measurements: Height: 5\' 5"  (165.1 cm) Weight: 106.2 kg (234 lb 2.1 oz) IBW/kg (Calculated) : 61.5 Heparin Dosing Weight: 85.7 kg  Vital Signs: Temp: 98.3 F (36.8 C) (10/02 0358) Temp Source: Oral (10/01 2212) BP: 131/100 (10/02 0400) Pulse Rate: 104 (10/02 0400)  Labs: Recent Labs    08/16/20 1344 08/17/20 0446 08/17/20 0446 08/18/20 0506 08/18/20 1446 08/19/20 0622  HGB  --  9.1*   < > 9.7*  --  9.1*  HCT  --  30.3*  --  32.7*  --  30.2*  PLT  --  465*  --  488*  --  425*  APTT 84* 86*  --  120*  --   --   HEPARINUNFRC  --  0.80*   < > 0.90* 0.69 0.44  CREATININE  --  0.84  --  0.85  --  0.77   < > = values in this interval not displayed.    Estimated Creatinine Clearance: 110.3 mL/min (by C-G formula based on SCr of 0.77 mg/dL).   Assessment: 31 y/oM with PMH of DVT (07/03/2020) on Apixaban PTA admitted on 08/10/2020 with SIRS. Given worsening abdominal distention, CT abd pelvis done today, which revealed a colonic ileus with pneumatosis of the ascending and hepatic flexure of the colon. PO meds held. Pharmacy consulted for IV heparin dosing while Apixaban on hold. Last dose of Apixaban 5mg  PO given this morning at 0921. Note that recent Apixaban use may falsely elevate heparin levels.   Baseline labs: Hg low at 8.2, pltc WNL, Heparin level >2.2, INR 1.5, aPTT 39 se, Scr 1.08  Today, 08/19/2020:  HL 0.44 remains at goal  Hg low but stable, pltc increased  No bleeding or infusion related concerns reported by RN  Still with unresolved ileus, unable to resume Eliquis  Goal of Therapy:  Heparin level 0.3-0.7 units/ml Monitor platelets by anticoagulation protocol: Yes   Plan:   Continue heparin infusion at 650  units/hr  Daily CBC and heparin level   Monitor closely for s/sx of bleeding, ability to resume [place], PharmD, BCPS [phone] 08/19/2020, 9:34 AM

## 2020-08-19 NOTE — Progress Notes (Signed)
Physical Therapy Treatment Patient Details Name: [name] MRN: [id number] DOB: [birth year] Today's Date: 08/19/2020    History of Present Illness 59 yo male admitted to [facility] on 9/24 with weakness, joint pain, difficulty urinating. Pt meeting SIRS criteria, possibly secondary to urinary infection. NGT placed 10/1 for ileus. PMHx significant for chronic gouty arthritis of multiple joints, CKD II, IDDM, HTN, HLD, and chronic anemia. Admitted to [facility] 8/11-8/24 for AKI, LUE DVT, and joint pain, d/c SNF.    PT Comments    Pt complaining of abdominal and NG tube-related pain today, but agreeable to bed-level activity. Pt required mod +2 to move to and from EOB, tolerating seated exercise x4 minutes ultimately limited by fatigue and abdominal pain. PT instructed pt to perform LE exercises independently throughout the day (ankle pumps, heel slides in supine), pt agreeable. Will continue to follow acutely.    Follow Up Recommendations  SNF;Supervision/Assistance - 24 hour     Equipment Recommendations  None recommended by PT    Recommendations for Other Services       Precautions / Restrictions Precautions Precautions: Fall Restrictions Weight Bearing Restrictions: No    Mobility  Bed Mobility Overal bed mobility: Needs Assistance Bed Mobility: Supine to Sit;Sit to Supine     Supine to sit: Mod assist;+2 for physical assistance Sit to supine: Mod assist;+2 for physical assistance   General bed mobility comments: Mod +2 for trunk and LE management, boost up in bed upon return to supine with pt assisting via UEs on bedrails.  Transfers                 General transfer comment: Pt declines transfer attempt  Ambulation/Gait             General Gait Details: pt declines   Stairs             Wheelchair Mobility    Modified Rankin (Stroke Patients Only)       Balance Overall balance assessment: Needs assistance Sitting-balance support: Single extremity  supported Sitting balance-Leahy Scale: Fair Sitting balance - Comments: able to sit EOB and perform LE exercise                                    Cognition Arousal/Alertness: Awake/alert Behavior During Therapy: Flat affect Overall Cognitive Status: Within Functional Limits for tasks assessed Area of Impairment: Following commands;Problem solving                       Following Commands: Follows one step commands with increased time     Problem Solving: Slow processing;Requires verbal cues;Requires tactile cues General Comments: slow processing, requires multimodal cuing for mobility.      Exercises General Exercises - Lower Extremity Ankle Circles/Pumps: AROM;Both;5 reps;Supine (to demonstrate ability to perform independently, to be performed throughout the day) Long Arc Quad: AAROM;Both;15 reps;Seated Heel Slides: AAROM;Both;5 reps;Supine Hip Flexion/Marching: AROM;Both;10 reps;Seated Shoulder Exercises Shoulder Flexion: AROM;Both;15 reps;Seated    General Comments        Pertinent Vitals/Pain Pain Assessment: Faces Faces Pain Scale: Hurts even more Pain Location: abdomen, site of NGT Pain Descriptors / Indicators: Discomfort;Guarding;Grimacing Pain Intervention(s): Limited activity within patient's tolerance;Monitored during session;Repositioned    Home Living                      Prior Function  PT Goals (current goals can now be found in the care plan section) Acute Rehab PT Goals Patient Stated Goal: get stronger PT Goal Formulation: With patient Time For Goal Achievement: 08/26/20 Potential to Achieve Goals: Fair Progress towards PT goals: Progressing toward goals    Frequency    Min 2X/week      PT Plan Current plan remains appropriate    Co-evaluation              AM-PAC PT "6 Clicks" Mobility   Outcome Measure  Help needed turning from your back to your side while in a flat bed without  using bedrails?: A Lot Help needed moving from lying on your back to sitting on the side of a flat bed without using bedrails?: A Lot Help needed moving to and from a bed to a chair (including a wheelchair)?: Total Help needed standing up from a chair using your arms (e.g., wheelchair or bedside chair)?: Total Help needed to walk in hospital room?: Total Help needed climbing 3-5 steps with a railing? : Total 6 Click Score: 8    End of Session   Activity Tolerance: Patient limited by fatigue Patient left: with call bell/phone within reach;in bed;with bed alarm set Nurse Communication: Mobility status PT Visit Diagnosis: Muscle weakness (generalized) (M62.81);Other abnormalities of gait and mobility (R26.89)     Time: 3570-1779 PT Time Calculation (min) (ACUTE ONLY): 14 min  Charges:  $Therapeutic Exercise: 8-22 mins                     Amarian Botero E, PT Acute Rehabilitation Services Pager [phone]  Office [phone]    Anjulie Dipierro D Elonda Husky 08/19/2020, 2:52 PM

## 2020-08-19 NOTE — Progress Notes (Signed)
PROGRESS NOTE    [name]  XBD:[id number] DOB: [birth year] DOA: 08/10/2020 PCP: Charlott Rakes, MD    Chief Complaint  Patient presents with  . Leg Swelling  . Medical Clearance    rehab placement     Brief Narrative:  59 year old black male with known history of IDDM since 2012, HTN, chronic gout Prior scrotal abscess Rx 2017 drained at that time Severe onychomycosis Severe tricompartmental osteoarthritis both knees He is status post bilateral arthroscopies with meniscectomies by Dr. Berenice Primas in 2017 He has had work-up in the past found to be ANA negative RF negative CCP negative and felt to be a combination of his chronic gout and possible osteoarthritis He was recently hospitalized 8/11 through 8/24 with AKI found at that time to have left upper extremity DVT in addition to polyarticular gout He also came in with difficulty with urination found to have SIRS criteria with fever 102.8-CT chest no embolism no pneumonia urine analysis showed negative leukocytes negative nitrites However urine culture grew E. coli He has had intractable pain in upper extremities arms and swelling of his joints and he does not remember ever getting tapped on his joints but on review of chart as above it looks that he has been thoroughly worked up in the past for other rheumatological disease process and was supposed to follow-up with Dr. Estanislado Pandy rheumatology in the outpatient setting-it is unclear if this was ever done   Assessment & Plan:   Principal Problem:   SIRS (systemic inflammatory response syndrome) ([place]) Active Problems:   Essential hypertension   Gout   Iron deficiency anemia   Elevated troponin   Generalized weakness   Uncontrolled type 2 diabetes mellitus with hyperglycemia, without long-term current use of insulin (HCC)   Acute deep vein thrombosis (DVT) of brachial vein of left upper extremity (HCC)   Ileus (HCC)   Hypokalemia   CKD (chronic kidney disease) stage 2, GFR  60-89 ml/min   Polyarticular gout  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  Patient stated had a large bowel movement on 08/15/2020, passing flatus.  Patient stated had a large bowel movement (08/18/2020) however per RN patient with mucus.  Patient also with some nausea and emesis on 08/18/2020. Per RN patient has not had any significant bowel movement since 08/15/2020. Patient stated ambulated in hallways on 08/15/2020 with therapy. Abdominal films done with stable diffuse colonic dilatation is noted most consistent with colonic ileus/adynamic ileus with progression and colonic pneumatosis..  CT abdomen and pelvis done 08/14/2020 with findings consistent with colonic ileus, pneumatosis of the ascending and hepatic flexure of the colon.  No associated colonic wall thickening or portal venous gas.  Normal small bowel without obstruction.  Hepatic steatosis.  Bilateral renal parenchymal atrophy with lobular contours.  Mild bladder wall thickening.  Patient with no significant change with abdominal distention.  Abdominal films this morning with persistent gaseous distention of large and small bowel loops.  Patient seen by GI and NG tube ordered however unable to be placed per RN.  NG tube placed by GI and a guidewire.  Per RN after NG tube placed patient with some relief of abdominal discomfort.  General surgery was following and initially recommended mobilization and GI decompression.  General surgery ordered neostigmine and per RN patient noted to have watery loose stools after neostigmine was given.  Rectal tube has been ordered per GI this  morning. Keep potassium > 4, magnesium > 2.  Currently n.p.o.  Supportive care.  GI and general surgery following.  2.  Polyarticular gout with osteoarthritis It is noted that patient had previous work-up with rheumatoid  factor and ANA which were negative.  Steroids currently on hold.  Allopurinol discontinued on admission.  Colchicine on hold.  Likely needs SNF.  Will need outpatient follow-up with rheumatology as previously scheduled.  3.  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  Patient noted to have been on 70/30 insulin 50 units twice daily.  CBG 178 this morning.  CBG was 64 the morning of 08/16/2020 and as such Lantus changed to 7 units nightly.  Continue current regimen of Lantus and sliding scale insulin.  IV Reglan.   4.  Hypokalemia/hypomagnesemia Potassium currently at 3.6.  Magnesium at 2.0.  KCl 10 mEq IV every 1 hour x4 runs.  Keep potassium >4, magnesium >2.  Follow.   5.  Left upper extremity DVT Was on apixaban twice daily.  Patient currently n.p.o. due to colonic ileus.  IV heparin for anticoagulation.  Once on a diet consistently will resume apixaban.  Follow.   6.  Iron deficiency anemia Status post IV Feraheme 08/12/2020.  Once on the diet will need to resume oral iron supplementation oral discharge.  Hemoglobin currently stable at 9.1.  Follow H&H.  Transfusion threshold hemoglobin < 7.  7.  Prior bilateral knee surgeries PT/OT.  SNF placement.  8.  Chronic kidney disease stage II Stable.  Follow.  9.  E. coli urinary tract infection Urine cultures > 100,000 colonies of E. coli.  Was on Keflex.  Now on IV Ancef as patient currently n.p.o. continue IV Ancef due to abdominal distention, patient now with a metabolic acidosis, concern for ischemic bowel.  Lactic acid level within normal limits.  Acidosis improved on bicarb drip.  Discontinue IV antibiotics after today's dose.  Follow.  10.  Metabolic acidosis Patient noted to be acidotic.  Concern for abdominal ischemia as patient with significant colonic ileus with no significant improvement.  Lactic acid level within normal limits.  Acidosis improved on bicarb drip.  Could likely discontinue  bicarb drip tomorrow with continued improvement.  Follow.   11.  Severe onchogryphosis   DVT prophylaxis: Heparin Code Status: Full Family Communication: Updated patient.  No family at bedside. Disposition:   Status is: Inpatient    Dispo:  Patient From: Home  Planned Disposition: [place]  Expected discharge date: 08/22/20  Medically stable for discharge: No        Consultants:   Gastroenterology: Dr. Collene Mares 08/14/2020  General surgery: Dr. Hassell Done 08/14/2020  Procedures:   CT angiogram chest 08/10/2020  CT abdomen and pelvis 08/14/2020  2D echo 08/11/2020  Abdominal films 08/18/2020, 08/17/2020, 08/16/2020, 08/14/2020,   Antimicrobials:   Keflex 08/12/2020>>>> 08/14/2020  IV Ancef 08/15/2020>>>> 08/19/2020  IV Rocephin 08/14/2020>>> 08/15/2020   Subjective: Patient laying on the side getting ready for rectal tube to be inserted.  States abdomen is a little bit soft after NG tube placement.  Denies any further emesis today.  No nausea.  No chest pain.  No shortness of breath.   Objective: Vitals:   08/18/20 2212 08/19/20 0202 08/19/20 0358 08/19/20 0400  BP:  (!) 145/105  (!) 131/100  Pulse:  99 (!) 104 (!) 104  Resp:  16 17 14   Temp: 98.4 F (36.9 C) 98.4 F (36.9 C) 98.3 F (36.8 C)   TempSrc: Oral  SpO2:  96% 97% 96%  Weight:      Height:        Intake/Output Summary (Last 24 hours) at 08/19/2020 1310 Last data filed at 08/19/2020 1059 Gross per 24 hour  Intake 2261.81 ml  Output 1175 ml  Net 1086.81 ml   Filed Weights   08/11/20 0321  Weight: 106.2 kg    Examination:  General exam: NAD.  NG tube in place. Respiratory system: Decreased breath sounds in the bases.  No wheezes, no crackles, no rhonchi.  Fair air movement.  Cardiovascular system: Tachycardia.  No murmurs rubs or gallops.  Trace lower extremity edema. Gastrointestinal system: Abdomen significantly distended, tight, high-pitched bowel sounds, nontender to palpation.   Central nervous system: Alert and oriented. No focal neurological deficits. Extremities: Symmetric 5 x 5 power. Skin: No rashes, lesions or ulcers Psychiatry: Judgement and insight appear normal. Mood & affect appropriate.     Data Reviewed: I have personally reviewed following labs and imaging studies  CBC: Recent Labs  Lab 08/13/20 0557 08/13/20 0557 08/14/20 0501 08/14/20 0501 08/15/20 0545 08/16/20 0505 08/17/20 0446 08/18/20 0506 08/19/20 0622  WBC 10.8*   < > 9.4   < > 10.7* 10.3 9.9 9.7 9.4  NEUTROABS 8.5*  --  7.3  --  7.2  --   --   --   --   HGB 8.4*   < > 8.2*   < > 9.1* 9.2* 9.1* 9.7* 9.1*  HCT 26.3*   < > 26.6*   < > 29.6* 29.8* 30.3* 32.7* 30.2*  MCV 76.0*   < > 78.5*   < > 78.5* 78.2* 80.4 80.7 80.1  PLT 387   < > 380   < > 449* 513* 465* 488* 425*   < > = values in this interval not displayed.    Basic Metabolic Panel: Recent Labs  Lab 08/14/20 0501 08/14/20 0501 08/15/20 0545 08/16/20 0505 08/17/20 0446 08/18/20 0506 08/19/20 0622  NA 144   < > 134* 141 139 140 144  K 3.7   < > 3.0* 3.6 4.1 4.6 3.6  CL 108   < > 104 111 112* 112* 109  CO2 22   < > 21* 21* 18* 16* 23  GLUCOSE 146*   < > 135* 68* 96 126* 179*  BUN 29*   < > 30* 24* 18 17 13   CREATININE 1.06   < > 1.08 0.91 0.84 0.85 0.77  CALCIUM 8.9   < > 8.5* 8.3* 8.5* 8.6* 8.5*  MG 2.0  --  1.9  --  1.8 2.3 2.0  PHOS  --   --   --   --  2.8  --   --    < > = values in this interval not displayed.    GFR: Estimated Creatinine Clearance: 110.3 mL/min (by C-G formula based on SCr of 0.77 mg/dL).  Liver Function Tests: Recent Labs  Lab 08/13/20 0557 08/14/20 0501 08/15/20 0545 08/16/20 0505 08/17/20 0446  AST 83* 50* 31 22  --   ALT 67* 60* 52* 39  --   ALKPHOS 97 91 88 90  --   BILITOT 0.5 0.5 0.5 0.5  --   PROT 6.6 6.5 6.6 6.4*  --   ALBUMIN 2.3* 2.5* 2.6* 2.6* 2.8*    CBG: Recent Labs  Lab 08/18/20 2132 08/19/20 0023 08/19/20 0421 08/19/20 0759 08/19/20 1207  GLUCAP  160* 156* 175* 178* 151*     Recent Results (from the  past 240 hour(s))  Blood Culture (routine x 2)     Status: None   Collection Time: 08/10/20  8:08 PM   Specimen: BLOOD  Result Value Ref Range Status   Specimen Description   Final    BLOOD RIGHT ANTECUBITAL Performed at [facility] [address] [place], [place] [address]    Special Requests   Final    BOTTLES DRAWN AEROBIC AND ANAEROBIC Blood Culture adequate volume Performed at [facility] [address], [place], [place] [address]    Culture   Final    NO GROWTH 5 DAYS Performed at [facility], [place] [address] [place], [place] [address]    Report Status 08/15/2020 FINAL  Final  Respiratory Panel by RT PCR (Flu A&B, Covid) - Nasopharyngeal Swab     Status: None   Collection Time: 08/10/20  8:08 PM   Specimen: Nasopharyngeal Swab  Result Value Ref Range Status   SARS Coronavirus 2 by RT PCR NEGATIVE NEGATIVE Final    Comment: (NOTE) SARS-CoV-2 target nucleic acids are NOT DETECTED.  The SARS-CoV-2 RNA is generally detectable in upper respiratoy specimens during the acute phase of infection. The lowest concentration of SARS-CoV-2 viral copies this assay can detect is 131 copies/mL. A negative result does not preclude SARS-Cov-2 infection and should not be used as the sole basis for treatment or other patient management decisions. A negative result may occur with  improper specimen collection/handling, submission of specimen other than nasopharyngeal swab, presence of viral mutation(s) within the areas targeted by this assay, and inadequate number of viral copies (<131 copies/mL). A negative result must be combined with clinical observations, patient history, and epidemiological information. The expected result is Negative.  Fact Sheet for Patients:  PinkCheek.be  Fact Sheet for Healthcare Providers:   GravelBags.it  This test is no t yet approved or cleared by the [place] FDA and  has been authorized for detection and/or diagnosis of SARS-CoV-2 by FDA under an Emergency Use Authorization (EUA). This EUA will remain  in effect (meaning this test can be used) for the duration of the COVID-19 declaration under Section 564(b)(1) of the Act, 21 U.S.C. section 360bbb-3(b)(1), unless the authorization is terminated or revoked sooner.     Influenza A by PCR NEGATIVE NEGATIVE Final   Influenza B by PCR NEGATIVE NEGATIVE Final    Comment: (NOTE) The Xpert Xpress SARS-CoV-2/FLU/RSV assay is intended as an aid in  the diagnosis of influenza from Nasopharyngeal swab specimens and  should not be used as a sole basis for treatment. Nasal washings and  aspirates are unacceptable for Xpert Xpress SARS-CoV-2/FLU/RSV  testing.  Fact Sheet for Patients: PinkCheek.be  Fact Sheet for Healthcare Providers: GravelBags.it  This test is not yet approved or cleared by the [place] FDA and  has been authorized for detection and/or diagnosis of SARS-CoV-2 by  FDA under an Emergency Use Authorization (EUA). This EUA will remain  in effect (meaning this test can be used) for the duration of the  Covid-19 declaration under Section 564(b)(1) of the Act, 21  U.S.C. section 360bbb-3(b)(1), unless the authorization is  terminated or revoked. Performed at [facility], [place] [address] [place], [place] [address]   Urine culture     Status: Abnormal   Collection Time: 08/11/20  1:00 AM   Specimen: In/Out Cath Urine  Result Value Ref Range Status   Specimen Description   Final    IN/OUT CATH URINE Performed  at [facility], [place] [address] [place], [place] [address]    Special Requests   Final    NONE Performed at [facility], [place] [address] [place] [address]  [place], [place] [address]    Culture >=100,000 COLONIES/mL ESCHERICHIA COLI (A)  Final   Report Status 08/13/2020 FINAL  Final   Organism ID, Bacteria ESCHERICHIA COLI (A)  Final      Susceptibility   Escherichia coli - MIC*    AMPICILLIN 4 SENSITIVE Sensitive     CEFAZOLIN <=4 SENSITIVE Sensitive     CEFTRIAXONE <=0.25 SENSITIVE Sensitive     CIPROFLOXACIN <=0.25 SENSITIVE Sensitive     GENTAMICIN <=1 SENSITIVE Sensitive     IMIPENEM <=0.25 SENSITIVE Sensitive     NITROFURANTOIN <=16 SENSITIVE Sensitive     TRIMETH/SULFA <=20 SENSITIVE Sensitive     AMPICILLIN/SULBACTAM <=2 SENSITIVE Sensitive     PIP/TAZO <=4 SENSITIVE Sensitive     * >=100,000 COLONIES/mL ESCHERICHIA COLI  Blood Culture (routine x 2)     Status: None   Collection Time: 08/11/20  2:48 AM   Specimen: BLOOD  Result Value Ref Range Status   Specimen Description   Final    BLOOD RIGHT HAND Performed at [facility] [address] [place], [place] [address]    Special Requests   Final    BOTTLES DRAWN AEROBIC AND ANAEROBIC Blood Culture adequate volume Performed at [facility] [address], [place], [place] [address]    Culture   Final    NO GROWTH 5 DAYS Performed at [facility], [place] [address] [place], [place] [address]    Report Status 08/16/2020 FINAL  Final         Radiology Studies: DG Abd 1 View  Result Date: 08/19/2020 CLINICAL DATA:  Ileus. EXAM: ABDOMEN - 1 VIEW COMPARISON:  None. FINDINGS: Persistent gaseous distension of large and small bowel loops within the visualized abdomen, similar in appearance. Gastric tube is coiled within the stomach with the tip projecting peripelvic. IMPRESSION: Persistent diffuse gaseous distension of large and small bowel loops. Electronically Signed   By: Margaretha Sheffield MD   On: 08/19/2020 10:54   DG Abd 1 View  Result Date: 08/18/2020 CLINICAL DATA:  Nasogastric tube placement EXAM: ABDOMEN - 1 VIEW COMPARISON:   08/18/2020 FINDINGS: Interval placement of nasogastric tube which is coiled within the proximal stomach. Persistent diffuse gaseous distension of large and small bowel loops within the visualized abdomen, unchanged in appearance. IMPRESSION: 1. Interval placement of nasogastric tube which is coiled within the proximal stomach. 2. Persistent diffuse gaseous distension of large and small bowel loops. Electronically Signed   By: Davina Poke D.O.   On: 08/18/2020 13:17   DG Abd 1 View  Result Date: 08/18/2020 CLINICAL DATA:  Ileus EXAM: ABDOMEN - 1 VIEW COMPARISON:  08/17/2020 FINDINGS: Diffuse gas distention of large and small bowel most consistent with adynamic ileus. No improvement since previous study. IMPRESSION: Persistent adynamic ileus. Electronically Signed   By: Lucienne Capers M.D.   On: 08/18/2020 05:48        Scheduled Meds: . insulin glargine  7 Units Subcutaneous QHS  . metoCLOPramide (REGLAN) injection  5 mg Intravenous Q8H  . metoprolol tartrate  7.5 mg Intravenous Q6H   Continuous Infusions: .  ceFAZolin (ANCEF) IV 1 g (08/19/20 0558)  . dextrose 5 % 1,000 mL with potassium chloride 20 mEq, sodium bicarbonate 150 mEq infusion 100 mL/hr at 08/18/20 2351  .  heparin 650 Units/hr (08/18/20 0855)  . potassium chloride 10 mEq (08/19/20 1157)     LOS: 7 days    Time spent: 40 minutes    Irine Seal, MD [facility]   To contact the attending provider between 7A-7P or the covering provider during after hours 7P-7A, please log into the web site [email or web address] and access using universal [place] password for that web site. If you do not have the password, please call the hospital operator.  08/19/2020, 1:10 PM

## 2020-08-19 NOTE — Progress Notes (Signed)
[facility] Progress Note Covering for Drs. Collene Mares and Benson [place] this weekend    Since last GI note: Neostigmine given yesteday  NG tube placed yesterday, KUB confirms it is in his stomach. Epic flowsheet shows 300cc output from NGtube in past 24 hours however there is quite a bit more than that in his cannister.  Pt says neostigmine was indeed given and cause a lot of diarrhea output and also vomiting.    No abd films this morning  Objective: Vital signs in last 24 hours: Temp:  [98.1 F (36.7 C)-98.4 F (36.9 C)] 98.3 F (36.8 C) (10/02 0358) Pulse Rate:  [99-115] 104 (10/02 0400) Resp:  [14-20] 14 (10/02 0400) BP: (131-155)/(97-118) 131/100 (10/02 0400) SpO2:  [96 %-99 %] 96 % (10/02 0400) Last BM Date: 08/18/20 General: alert and oriented times 3 Heart: regular rate and rythm Abdomen: tense, distended, no bowel sounds   Lab Results: Recent Labs    08/17/20 0446 08/18/20 0506 08/19/20 0622  WBC 9.9 9.7 9.4  HGB 9.1* 9.7* 9.1*  PLT 465* 488* 425*  MCV 80.4 80.7 80.1   Recent Labs    08/17/20 0446 08/18/20 0506  NA 139 140  K 4.1 4.6  CL 112* 112*  CO2 18* 16*  GLUCOSE 96 126*  BUN 18 17  CREATININE 0.84 0.85  CALCIUM 8.5* 8.6*   Recent Labs    08/17/20 0446  ALBUMIN 2.8*    Medications: Scheduled Meds: . insulin glargine  7 Units Subcutaneous QHS  . metoCLOPramide (REGLAN) injection  5 mg Intravenous Q8H  . metoprolol tartrate  7.5 mg Intravenous Q6H   Continuous Infusions: .  ceFAZolin (ANCEF) IV 1 g (08/19/20 0558)  . dextrose 5 % 1,000 mL with potassium chloride 20 mEq, sodium bicarbonate 150 mEq infusion 100 mL/hr at 08/18/20 2351  . heparin 650 Units/hr (08/18/20 0855)   PRN Meds:.hydrALAZINE, [DISCONTINUED] ondansetron **OR** ondansetron (ZOFRAN) IV  Assessment/Plan: 59 y.o. male with severe UTI, sepsis SIRS criteria leading to ileus  I am meeting him for the first time this AM. He is very distended, abd is tense. NG tube in  place. He tells me his abd distension is actually improved a bit.  Need repeat imaging this morning and I am having a rectal tube placed as well. Need to mobilize him as much as possible, turning right and left in the bed each for 2 hours if possible. Keep his electrolytes in normal range.  Milus Banister, MD  08/19/2020, 7:57 AM [facility] Pager [phone]

## 2020-08-19 NOTE — Progress Notes (Signed)
18 fr. Foley Catheter inserted per rectum per order (Dr. Ardis Hughs) without inflation.  Patient has put out over 529ml (unable to measure what has spilled on the bed) of liquid green stool and some gas.  Patient requests to take a break from rectal tube at this time, will attempt again later.  Patient states "I feel less bloated and better." NGT continues to put out, total of 436ml since 7am. Will continue to monitor.

## 2020-08-20 ENCOUNTER — Inpatient Hospital Stay (HOSPITAL_COMMUNITY): Payer: Medicare HMO

## 2020-08-20 DIAGNOSIS — E872 Acidosis, unspecified: Secondary | ICD-10-CM

## 2020-08-20 DIAGNOSIS — Z978 Presence of other specified devices: Secondary | ICD-10-CM

## 2020-08-20 DIAGNOSIS — I10 Essential (primary) hypertension: Secondary | ICD-10-CM | POA: Diagnosis not present

## 2020-08-20 DIAGNOSIS — R6510 Systemic inflammatory response syndrome (SIRS) of non-infectious origin without acute organ dysfunction: Secondary | ICD-10-CM | POA: Diagnosis not present

## 2020-08-20 DIAGNOSIS — M10041 Idiopathic gout, right hand: Secondary | ICD-10-CM | POA: Diagnosis not present

## 2020-08-20 DIAGNOSIS — N182 Chronic kidney disease, stage 2 (mild): Secondary | ICD-10-CM | POA: Diagnosis not present

## 2020-08-20 DIAGNOSIS — I82622 Acute embolism and thrombosis of deep veins of left upper extremity: Secondary | ICD-10-CM | POA: Diagnosis not present

## 2020-08-20 LAB — CBC
HCT: 26.6 % — ABNORMAL LOW (ref 39.0–52.0)
Hemoglobin: 8.1 g/dL — ABNORMAL LOW (ref 13.0–17.0)
MCH: 24.3 pg — ABNORMAL LOW (ref 26.0–34.0)
MCHC: 30.5 g/dL (ref 30.0–36.0)
MCV: 79.9 fL — ABNORMAL LOW (ref 80.0–100.0)
Platelets: 340 10*3/uL (ref 150–400)
RBC: 3.33 MIL/uL — ABNORMAL LOW (ref 4.22–5.81)
RDW: 22.5 % — ABNORMAL HIGH (ref 11.5–15.5)
WBC: 4.6 10*3/uL (ref 4.0–10.5)
nRBC: 0 % (ref 0.0–0.2)

## 2020-08-20 LAB — COMPREHENSIVE METABOLIC PANEL
ALT: 20 U/L (ref 0–44)
AST: 26 U/L (ref 15–41)
Albumin: 2.5 g/dL — ABNORMAL LOW (ref 3.5–5.0)
Alkaline Phosphatase: 76 U/L (ref 38–126)
Anion gap: 13 (ref 5–15)
BUN: 10 mg/dL (ref 6–20)
CO2: 29 mmol/L (ref 22–32)
Calcium: 8.1 mg/dL — ABNORMAL LOW (ref 8.9–10.3)
Chloride: 101 mmol/L (ref 98–111)
Creatinine, Ser: 0.65 mg/dL (ref 0.61–1.24)
GFR calc Af Amer: >60 mL/min (ref >60)
GFR calc non Af Amer: >60 mL/min (ref >60)
Glucose, Bld: 131 mg/dL — ABNORMAL HIGH (ref 70–99)
Potassium: 2.9 mmol/L — ABNORMAL LOW (ref 3.5–5.1)
Sodium: 143 mmol/L (ref 135–145)
Total Bilirubin: 0.7 mg/dL (ref 0.3–1.2)
Total Protein: 5.4 g/dL — ABNORMAL LOW (ref 6.5–8.1)

## 2020-08-20 LAB — GLUCOSE, CAPILLARY
Glucose-Capillary: 108 mg/dL — ABNORMAL HIGH (ref 70–99)
Glucose-Capillary: 120 mg/dL — ABNORMAL HIGH (ref 70–99)
Glucose-Capillary: 125 mg/dL — ABNORMAL HIGH (ref 70–99)
Glucose-Capillary: 132 mg/dL — ABNORMAL HIGH (ref 70–99)
Glucose-Capillary: 145 mg/dL — ABNORMAL HIGH (ref 70–99)
Glucose-Capillary: 154 mg/dL — ABNORMAL HIGH (ref 70–99)

## 2020-08-20 LAB — HEPARIN LEVEL (UNFRACTIONATED): Heparin Unfractionated: 0.33 IU/mL (ref 0.30–0.70)

## 2020-08-20 LAB — MAGNESIUM: Magnesium: 1.7 mg/dL (ref 1.7–2.4)

## 2020-08-20 MED ORDER — KCL IN DEXTROSE-NACL 40-5-0.9 MEQ/L-%-% IV SOLN
INTRAVENOUS | Status: AC
  Filled 2020-08-20 (×4): qty 1000

## 2020-08-20 MED ORDER — MAGNESIUM SULFATE 4 GM/100ML IV SOLN
4.0000 g | Freq: Once | INTRAVENOUS | Status: AC
  Administered 2020-08-20: 4 g via INTRAVENOUS
  Filled 2020-08-20: qty 100

## 2020-08-20 MED ORDER — POTASSIUM CHLORIDE 10 MEQ/100ML IV SOLN
10.0000 meq | INTRAVENOUS | Status: AC
  Administered 2020-08-20 (×5): 10 meq via INTRAVENOUS
  Filled 2020-08-20 (×5): qty 100

## 2020-08-20 MED ORDER — HEPARIN (PORCINE) 25000 UT/250ML-% IV SOLN
700.0000 [IU]/h | INTRAVENOUS | Status: DC
  Administered 2020-08-21: 700 [IU]/h via INTRAVENOUS
  Filled 2020-08-20: qty 250

## 2020-08-20 NOTE — Progress Notes (Addendum)
PROGRESS NOTE    [name]  TIW:[id number] DOB: Dec 17, 1960 DOA: 08/10/2020 PCP: Charlott Rakes, MD    Chief Complaint  Patient presents with  . Leg Swelling  . Medical Clearance    rehab placement     Brief Narrative:  59 year old black male with known history of IDDM since 2012, HTN, chronic gout Prior scrotal abscess Rx 2017 drained at that time Severe onychomycosis Severe tricompartmental osteoarthritis both knees He is status post bilateral arthroscopies with meniscectomies by Dr. Berenice Primas in 2017 He has had work-up in the past found to be ANA negative RF negative CCP negative and felt to be a combination of his chronic gout and possible osteoarthritis He was recently hospitalized 8/11 through 8/24 with AKI found at that time to have left upper extremity DVT in addition to polyarticular gout He also came in with difficulty with urination found to have SIRS criteria with fever 102.8-CT chest no embolism no pneumonia urine analysis showed negative leukocytes negative nitrites However urine culture grew E. coli He has had intractable pain in upper extremities arms and swelling of his joints and he does not remember ever getting tapped on his joints but on review of chart as above it looks that he has been thoroughly worked up in the past for other rheumatological disease process and was supposed to follow-up with Dr. Estanislado Pandy rheumatology in the outpatient setting-it is unclear if this was ever done   Assessment & Plan:   Principal Problem:   SIRS (systemic inflammatory response syndrome) ([place]) Active Problems:   Essential hypertension   Gout   Iron deficiency anemia   Elevated troponin   Generalized weakness   Uncontrolled type 2 diabetes mellitus with hyperglycemia, without long-term current use of insulin (HCC)   Acute deep vein thrombosis (DVT) of brachial vein of left upper extremity (HCC)   Ileus (HCC)   Hypokalemia   CKD (chronic kidney disease) stage 2, GFR  60-89 ml/min   Polyarticular gout  1 colonic and small bowel 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  Patient stated had a large bowel movement on 08/15/2020, passing flatus.  Patient stated had a large bowel movement (08/18/2020) however per RN patient with mucus.  Patient also with some nausea and emesis on 08/18/2020. Per RN patient has not had any significant bowel movement since 08/15/2020. Patient stated ambulated in hallways on 08/15/2020 with therapy. Abdominal films done with stable diffuse colonic dilatation is noted most consistent with colonic ileus/adynamic ileus with progression and colonic pneumatosis..  CT abdomen and pelvis done 08/14/2020 with findings consistent with colonic ileus, pneumatosis of the ascending and hepatic flexure of the colon.  No associated colonic wall thickening or portal venous gas.  Normal small bowel without obstruction.  Hepatic steatosis.  Bilateral renal parenchymal atrophy with lobular contours.  Mild bladder wall thickening.  Patient with no significant change with abdominal distention.  Abdominal films with persistent gaseous distention of large and small bowel loops.  Patient seen by GI and NG tube ordered however unable to be placed per RN.  NG tube placed by GI under guidewire.  Per RN after NG tube placed patient with some relief of abdominal discomfort.  General surgery was following and initially recommended mobilization and GI decompression.  General surgery ordered neostigmine and per RN patient noted to have watery loose stools after neostigmine was given.  Rectal tube has been ordered per GI.  Repeat abdominal films with unchanged gaseous distention of small bowel and colon compatible with ileus.  Keep potassium > 4, magnesium > 2.  Currently n.p.o.  Supportive care.  GI and general surgery following.  2.   Polyarticular gout with osteoarthritis It is noted that patient had previous work-up with rheumatoid factor and ANA which were negative.  Steroids currently on hold.  Allopurinol discontinued on admission.  Colchicine on hold.  Likely needs SNF.  Will need outpatient follow-up with rheumatology as previously scheduled.  3.  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  Patient noted to have been on 70/30 insulin 50 units twice daily.  CBG 132 this morning.  CBG was 64 the morning of 08/16/2020 and as such Lantus changed to 7 units nightly.  Continue current regimen of Lantus and sliding scale insulin.  IV Reglan.   4.  Hypokalemia/hypomagnesemia Potassium currently at 2.9.  Place potassium and IV fluids.  KCl 10 mEq IV every 1 hour x5 rounds.  Repeat basic metabolic profile this afternoon.  Magnesium sulfate 4 g IV x1.  Repeat labs in the morning.    5.  Left upper extremity DVT Was on apixaban twice daily.  Patient currently n.p.o. due to colonic ileus.  IV heparin for anticoagulation.  Once on a diet consistently will resume apixaban.  Follow.   6.  Iron deficiency anemia Status post IV Feraheme 08/12/2020.  Once on the diet will need to resume oral iron supplementation oral discharge.  Hemoglobin currently at 8.1.  Likely dilutional effect as well.  Patient with no overt bleeding.  Follow H&H. Transfusion threshold hemoglobin < 7.  7.  Prior bilateral knee surgeries PT/OT.  SNF placement.  8.  Chronic kidney disease stage II Stable.  Follow.  9.  E. coli urinary tract infection Urine cultures > 100,000 colonies of E. coli.  Was on Keflex and subsequently transition to IV Ancef as patient noted to be NPO.  Status post 7 - 8 days of antibiotics.  Antibiotics have been discontinued.  No further antibiotics needed at this time.  10.  Metabolic acidosis Patient noted to be acidotic.  Concern for abdominal ischemia as patient with significant colonic ileus  with no significant improvement.  Lactic acid level within normal limits.  Acidosis resolved with bicarb drip.  Discontinue bicarb drip.  Place on normal saline.  Follow.  11.  Severe onchogryphosis   DVT prophylaxis: Heparin Code Status: Full Family Communication: Updated patient.  Updated sister-in-law, Yolonda via telephone.  Disposition:   Status is: Inpatient    Dispo:  Patient From: Home  Planned Disposition: [place]  Expected discharge date: 08/22/20  Medically stable for discharge: No        Consultants:   Gastroenterology: Dr. Collene Mares 08/14/2020  General surgery: Dr. Hassell Done 08/14/2020  Procedures:   CT angiogram chest 08/10/2020  CT abdomen and pelvis 08/14/2020  2D echo 08/11/2020  Abdominal films 08/18/2020, 08/17/2020, 08/16/2020, 08/14/2020,   Antimicrobials:   Keflex 08/12/2020>>>> 08/14/2020  IV Ancef 08/15/2020>>>> 08/19/2020  IV Rocephin 08/14/2020>>> 08/15/2020   Subjective: Patient laying on his side.  States abdominal pain and distention a little bit better after NG tube and rectal tube placed.  Denies any nausea or emesis.  No chest pain.  No shortness of breath.   Objective: Vitals:   08/19/20 1340 08/19/20 2210 08/20/20 0600 08/20/20 1100  BP:  (!) 140/96 (!) 141/98 (!) 137/103  Pulse:  98 95 83  Resp:  (!) 22 19  Temp: 98.3 F (36.8 C) (!) 97.5 F (36.4 C) 97.9 F (36.6 C)   TempSrc:  Oral Oral   SpO2:  96% 94% 95%  Weight:   98.7 kg   Height:        Intake/Output Summary (Last 24 hours) at 08/20/2020 1306 Last data filed at 08/20/2020 3007 Gross per 24 hour  Intake 1843.79 ml  Output 2000 ml  Net -156.21 ml   Filed Weights   08/11/20 0321 08/20/20 0600  Weight: 106.2 kg 98.7 kg    Examination:  General exam: NAD.  NG tube in place. Respiratory system: Clear to auscultation bilaterally.  No wheezes, no crackles, no rhonchi.  Fair air movement.  Cardiovascular system: Regular rate rhythm no murmurs rubs or  gallops.  Trace lower extremity edema.  Gastrointestinal system: Abdomen distended, less tight, hypoactive bowel sounds, nontender to palpation.  Central nervous system: Alert and oriented. No focal neurological deficits. Extremities: Symmetric 5 x 5 power. Skin: No rashes, lesions or ulcers Psychiatry: Judgement and insight appear normal. Mood & affect appropriate.     Data Reviewed: I have personally reviewed following labs and imaging studies  CBC: Recent Labs  Lab 08/14/20 0501 08/14/20 0501 08/15/20 0545 08/15/20 0545 08/16/20 0505 08/17/20 0446 08/18/20 0506 08/19/20 0622 08/20/20 0624  WBC 9.4   < > 10.7*   < > 10.3 9.9 9.7 9.4 4.6  NEUTROABS 7.3  --  7.2  --   --   --   --   --   --   HGB 8.2*   < > 9.1*   < > 9.2* 9.1* 9.7* 9.1* 8.1*  HCT 26.6*   < > 29.6*   < > 29.8* 30.3* 32.7* 30.2* 26.6*  MCV 78.5*   < > 78.5*   < > 78.2* 80.4 80.7 80.1 79.9*  PLT 380   < > 449*   < > 513* 465* 488* 425* 340   < > = values in this interval not displayed.    Basic Metabolic Panel: Recent Labs  Lab 08/15/20 0545 08/15/20 0545 08/16/20 0505 08/17/20 0446 08/18/20 0506 08/19/20 0622 08/20/20 0624  NA 134*   < > 141 139 140 144 143  K 3.0*   < > 3.6 4.1 4.6 3.6 2.9*  CL 104   < > 111 112* 112* 109 101  CO2 21*   < > 21* 18* 16* 23 29  GLUCOSE 135*   < > 68* 96 126* 179* 131*  BUN 30*   < > 24* 18 17 13 10   CREATININE 1.08   < > 0.91 0.84 0.85 0.77 0.65  CALCIUM 8.5*   < > 8.3* 8.5* 8.6* 8.5* 8.1*  MG 1.9  --   --  1.8 2.3 2.0 1.7  PHOS  --   --   --  2.8  --   --   --    < > = values in this interval not displayed.    GFR: Estimated Creatinine Clearance: 106.1 mL/min (by C-G formula based on SCr of 0.65 mg/dL).  Liver Function Tests: Recent Labs  Lab 08/14/20 0501 08/15/20 0545 08/16/20 0505 08/17/20 0446 08/20/20 0624  AST 50* 31 22  --  26  ALT 60* 52* 39  --  20  ALKPHOS 91 88 90  --  76  BILITOT 0.5 0.5 0.5  --  0.7  PROT 6.5 6.6 6.4*  --  5.4*   ALBUMIN 2.5* 2.6* 2.6* 2.8* 2.5*    CBG:  Recent Labs  Lab 08/19/20 2118 08/20/20 0000 08/20/20 0402 08/20/20 0754 08/20/20 1113  GLUCAP 149* 145* 125* 132* 120*     Recent Results (from the past 240 hour(s))  Blood Culture (routine x 2)     Status: None   Collection Time: 08/10/20  8:08 PM   Specimen: BLOOD  Result Value Ref Range Status   Specimen Description   Final    BLOOD RIGHT ANTECUBITAL Performed at [facility], [place] [address], [place], [place] [address]    Special Requests   Final    BOTTLES DRAWN AEROBIC AND ANAEROBIC Blood Culture adequate volume Performed at [place] [address] [place], [place] [address]    Culture   Final    NO GROWTH 5 DAYS Performed at [facility], [place] [address] [place], [place] [address]    Report Status 08/15/2020 FINAL  Final  Respiratory Panel by RT PCR (Flu A&B, Covid) - Nasopharyngeal Swab     Status: None   Collection Time: 08/10/20  8:08 PM   Specimen: Nasopharyngeal Swab  Result Value Ref Range Status   SARS Coronavirus 2 by RT PCR NEGATIVE NEGATIVE Final    Comment: (NOTE) SARS-CoV-2 target nucleic acids are NOT DETECTED.  The SARS-CoV-2 RNA is generally detectable in upper respiratoy specimens during the acute phase of infection. The lowest concentration of SARS-CoV-2 viral copies this assay can detect is 131 copies/mL. A negative result does not preclude SARS-Cov-2 infection and should not be used as the sole basis for treatment or other patient management decisions. A negative result may occur with  improper specimen collection/handling, submission of specimen other than nasopharyngeal swab, presence of viral mutation(s) within the areas targeted by this assay, and inadequate number of viral copies (<131 copies/mL). A negative result must be combined with clinical observations, patient history, and epidemiological information. The expected result is  Negative.  Fact Sheet for Patients:  PinkCheek.be  Fact Sheet for Healthcare Providers:  GravelBags.it  This test is no t yet approved or cleared by the [place] FDA and  has been authorized for detection and/or diagnosis of SARS-CoV-2 by FDA under an Emergency Use Authorization (EUA). This EUA will remain  in effect (meaning this test can be used) for the duration of the COVID-19 declaration under Section 564(b)(1) of the Act, 21 U.S.C. section 360bbb-3(b)(1), unless the authorization is terminated or revoked sooner.     Influenza A by PCR NEGATIVE NEGATIVE Final   Influenza B by PCR NEGATIVE NEGATIVE Final    Comment: (NOTE) The Xpert Xpress SARS-CoV-2/FLU/RSV assay is intended as an aid in  the diagnosis of influenza from Nasopharyngeal swab specimens and  should not be used as a sole basis for treatment. Nasal washings and  aspirates are unacceptable for Xpert Xpress SARS-CoV-2/FLU/RSV  testing.  Fact Sheet for Patients: PinkCheek.be  Fact Sheet for Healthcare Providers: GravelBags.it  This test is not yet approved or cleared by the [place] FDA and  has been authorized for detection and/or diagnosis of SARS-CoV-2 by  FDA under an Emergency Use Authorization (EUA). This EUA will remain  in effect (meaning this test can be used) for the duration of the  Covid-19 declaration under Section 564(b)(1) of the Act, 21  U.S.C. section 360bbb-3(b)(1), unless the authorization is  terminated or revoked. Performed at [facility], [place] [address], [place], [place] [address]   Urine culture     Status: Abnormal   Collection Time: 08/11/20  1:00 AM   Specimen: In/Out Cath Urine  Result Value Ref Range Status   Specimen Description   Final    IN/OUT CATH URINE Performed at [facility], [place] [address], [place],  [place] [address]    Special Requests   Final    NONE Performed at [facility], [place] [address] [place], [place] [address]    Culture >=100,000 COLONIES/mL ESCHERICHIA COLI (A)  Final   Report Status 08/13/2020 FINAL  Final   Organism ID, Bacteria ESCHERICHIA COLI (A)  Final      Susceptibility   Escherichia coli - MIC*    AMPICILLIN 4 SENSITIVE Sensitive     CEFAZOLIN <=4 SENSITIVE Sensitive     CEFTRIAXONE <=0.25 SENSITIVE Sensitive     CIPROFLOXACIN <=0.25 SENSITIVE Sensitive     GENTAMICIN <=1 SENSITIVE Sensitive     IMIPENEM <=0.25 SENSITIVE Sensitive     NITROFURANTOIN <=16 SENSITIVE Sensitive     TRIMETH/SULFA <=20 SENSITIVE Sensitive     AMPICILLIN/SULBACTAM <=2 SENSITIVE Sensitive     PIP/TAZO <=4 SENSITIVE Sensitive     * >=100,000 COLONIES/mL ESCHERICHIA COLI  Blood Culture (routine x 2)     Status: None   Collection Time: 08/11/20  2:48 AM   Specimen: BLOOD  Result Value Ref Range Status   Specimen Description   Final    BLOOD RIGHT HAND Performed at [place] [address] [place], [place] [address]    Special Requests   Final    BOTTLES DRAWN AEROBIC AND ANAEROBIC Blood Culture adequate volume Performed at [place] [address] [place], [place] [address]    Culture   Final    NO GROWTH 5 DAYS Performed at [facility], [place] [address], [place], [place] [address]    Report Status 08/16/2020 FINAL  Final         Radiology Studies: DG Abd 1 View  Result Date: 08/20/2020 CLINICAL DATA:  Ileus. EXAM: ABDOMEN - 1 VIEW COMPARISON:  08/19/2020 FINDINGS: An enteric tube is unchanged with tip in the expected region of the distal stomach. Diffuse gaseous distension of small and large bowel loops is similar to the prior study. No acute osseous abnormality is identified. There is no gross consolidation in the included lung bases. IMPRESSION: Unchanged gaseous distension of small bowel and colon  compatible with ileus. Electronically Signed   By: Logan Bores M.D.   On: 08/20/2020 05:53   DG Abd 1 View  Result Date: 08/19/2020 CLINICAL DATA:  Ileus. EXAM: ABDOMEN - 1 VIEW COMPARISON:  None. FINDINGS: Persistent gaseous distension of large and small bowel loops within the visualized abdomen, similar in appearance. Gastric tube is coiled within the stomach with the tip projecting peripelvic. IMPRESSION: Persistent diffuse gaseous distension of large and small bowel loops. Electronically Signed   By: Margaretha Sheffield MD   On: 08/19/2020 10:54   DG Abd 1 View  Result Date: 08/18/2020 CLINICAL DATA:  Nasogastric tube placement EXAM: ABDOMEN - 1 VIEW COMPARISON:  08/18/2020 FINDINGS: Interval placement of nasogastric tube which is coiled within the proximal stomach. Persistent diffuse gaseous distension of large and small bowel loops within the visualized abdomen, unchanged in appearance. IMPRESSION: 1. Interval placement of nasogastric tube which is coiled within the proximal stomach. 2. Persistent diffuse gaseous distension of large and small bowel loops. Electronically Signed   By: Davina Poke D.O.   On: 08/18/2020 13:17  Scheduled Meds: . insulin glargine  7 Units Subcutaneous QHS  . metoCLOPramide (REGLAN) injection  5 mg Intravenous Q8H  . metoprolol tartrate  7.5 mg Intravenous Q6H   Continuous Infusions: . dextrose 5 % and 0.9 % NaCl with KCl 40 mEq/L 100 mL/hr at 08/20/20 0956  . heparin 700 Units/hr (08/20/20 1139)  . potassium chloride 10 mEq (08/20/20 1208)     LOS: 8 days    Time spent: 40 minutes    Irine Seal, MD [facility]   To contact the attending provider between 7A-7P or the covering provider during after hours 7P-7A, please log into the web site [email or web address] and access using universal [place] password for that web site. If you do not have the password, please call the hospital operator.  08/20/2020, 1:06 PM

## 2020-08-20 NOTE — Progress Notes (Addendum)
[place] for IV heparin  Indication: history of DVT  Allergies  Allergen Reactions  . Hctz [Hydrochlorothiazide]     Hx of gout; uric acid 14.1 on 03/13/20 Acute gout L elbow 8/11-8/24/21    Patient Measurements: Height: 5\' 5"  (165.1 cm) Weight: 98.7 kg (217 lb 9.5 oz) IBW/kg (Calculated) : 61.5 Heparin Dosing Weight: 85.7 kg  Vital Signs: Temp: 97.9 F (36.6 C) (10/03 0600) Temp Source: Oral (10/03 0600) BP: 141/98 (10/03 0600) Pulse Rate: 95 (10/03 0600)  Labs: Recent Labs    08/18/20 0506 08/18/20 0506 08/18/20 1446 08/19/20 0622 08/20/20 0624  HGB 9.7*   < >  --  9.1* 8.1*  HCT 32.7*  --   --  30.2* 26.6*  PLT 488*  --   --  425* 340  APTT 120*  --   --   --   --   HEPARINUNFRC 0.90*   < > 0.69 0.44 0.33  CREATININE 0.85  --   --  0.77 0.65   < > = values in this interval not displayed.    Estimated Creatinine Clearance: 106.1 mL/min (by C-G formula based on SCr of 0.65 mg/dL).   Assessment: 36 y/oM with PMH of DVT (07/03/2020) on Apixaban PTA admitted on 08/10/2020 with SIRS. Given worsening abdominal distention, CT abd pelvis done today, which revealed a colonic ileus with pneumatosis of the ascending and hepatic flexure of the colon. PO meds held. Pharmacy consulted for IV heparin dosing while Apixaban on hold. Last dose of Apixaban 5mg  PO given this morning at 0921. Note that recent Apixaban use may falsely elevate heparin levels.   Baseline labs: Hg low at 8.2, pltc WNL, Heparin level >2.2, INR 1.5, aPTT 39 se, Scr 1.08  Today, 08/20/2020:  HL 0.33 remains at goal but lower  Infusion not paused per RN  Hg low, down some; pltc wnl  No bleeding or infusion related concerns reported by RN  Still with unresolved ileus, unable to resume Eliquis  Goal of Therapy:  Heparin level 0.3-0.7 units/ml Monitor platelets by anticoagulation protocol: Yes   Plan:   Increase heparin infusion slightly to 700 units/hr  Daily  CBC and heparin level   Monitor closely for s/sx of bleeding, ability to resume [place], PharmD, [place] 08/20/2020, 10:21 AM

## 2020-08-20 NOTE — Progress Notes (Signed)
[facility] Progress Note Covering for Drs. Collene Mares and Benson Norway this weekend    Since last GI note: During rectal tube placement yesterday he put out over 500cc liquid stool and felt improved.    KUB is not improved.  Objective: Vital signs in last 24 hours: Temp:  [97.5 F (36.4 C)-97.9 F (36.6 C)] 97.9 F (36.6 C) (10/03 0600) Pulse Rate:  [78-98] 78 (10/03 1300) Resp:  [18-22] 18 (10/03 1300) BP: (134-141)/(86-103) 134/86 (10/03 1300) SpO2:  [94 %-96 %] 96 % (10/03 1300) Weight:  [98.7 kg] 98.7 kg (10/03 0600) Last BM Date: 08/20/20 General: alert and oriented times 3 Heart: regular rate and rythm Abdomen: soft, non-tender, non-distended, normal bowel sounds   Lab Results: Recent Labs    08/18/20 0506 08/19/20 0622 08/20/20 0624  WBC 9.7 9.4 4.6  HGB 9.7* 9.1* 8.1*  PLT 488* 425* 340  MCV 80.7 80.1 79.9*   Recent Labs    08/18/20 0506 08/19/20 0622 08/20/20 0624  NA 140 144 143  K 4.6 3.6 2.9*  CL 112* 109 101  CO2 16* 23 29  GLUCOSE 126* 179* 131*  BUN 17 13 10   CREATININE 0.85 0.77 0.65  CALCIUM 8.6* 8.5* 8.1*   Recent Labs    08/20/20 0624  PROT 5.4*  ALBUMIN 2.5*  AST 26  ALT 20  ALKPHOS 76  BILITOT 0.7   No results for input(s): INR in the last 72 hours.  Studies/Results: DG Abd 1 View  Result Date: 08/20/2020 CLINICAL DATA:  Ileus. EXAM: ABDOMEN - 1 VIEW COMPARISON:  08/19/2020 FINDINGS: An enteric tube is unchanged with tip in the expected region of the distal stomach. Diffuse gaseous distension of small and large bowel loops is similar to the prior study. No acute osseous abnormality is identified. There is no gross consolidation in the included lung bases. IMPRESSION: Unchanged gaseous distension of small bowel and colon compatible with ileus. Electronically Signed   By: Logan Bores M.D.   On: 08/20/2020 05:53   DG Abd 1 View  Result Date: 08/19/2020 CLINICAL DATA:  Ileus. EXAM: ABDOMEN - 1 VIEW COMPARISON:  None. FINDINGS:  Persistent gaseous distension of large and small bowel loops within the visualized abdomen, similar in appearance. Gastric tube is coiled within the stomach with the tip projecting peripelvic. IMPRESSION: Persistent diffuse gaseous distension of large and small bowel loops. Electronically Signed   By: Margaretha Sheffield MD   On: 08/19/2020 10:54     Medications: Scheduled Meds: . insulin glargine  7 Units Subcutaneous QHS  . metoCLOPramide (REGLAN) injection  5 mg Intravenous Q8H  . metoprolol tartrate  7.5 mg Intravenous Q6H   Continuous Infusions: . dextrose 5 % and 0.9 % NaCl with KCl 40 mEq/L 100 mL/hr at 08/20/20 0956  . heparin 700 Units/hr (08/20/20 1139)  . potassium chloride 10 mEq (08/20/20 1310)   PRN Meds:.hydrALAZINE, [DISCONTINUED] ondansetron **OR** ondansetron (ZOFRAN) IV   Assessment/Plan: 59 y.o. male with severe UTI, sepsis SIRS criteria leading to ileus  KUB this AM unchanged however rectal tube attempt (not left in place) while laying on his side yesterday led to 500cc liquid stool output. I think this is very instructive, stool output  He really needs to be mobilized; laying on left side for 2 hours, then right side for 2 hours then repeat all day unless he is asleep. Needs to be out of bed if possible. Ambulating would be ideal and would probably really mobilize some of his trapped gas.  Electrolytes need to be kept WNL, especially K which is low today.    Milus Banister, MD  08/20/2020, 1:46 PM [facility] Pager [phone]

## 2020-08-20 NOTE — TOC Initial Note (Signed)
Transition of Care [facility]) - Initial/Assessment Note    Patient Details  Name: [name] MRN: [id number] Date of Birth: 07-04-1961  [facility]) CM/SW Contact:    Greg Cutter, LCSW Phone Number: 08/20/2020, 11:35 AM  Clinical Narrative:  CSW completed call to patient's spouse on 08/20/20. Family was strongly encouraged to make a choice regarding SNF placement but spouse reported that they are still reviewing approved SNF list and cannot make a decision at this time. Spouse stated several times "He isn't ready to leave the hospital yet and I need more time to look into these facilities." CSW encouraged patient's family to make a decision by tomororw on 08/21/20. CSW provided spouse the list of approved SNF's that patient can discharge to again on 08/21/20.         Activities of Daily Living Home Assistive Devices/Equipment: Cane (specify quad or straight), Walker (specify type) ADL Screening (condition at time of admission) Patient's cognitive ability adequate to safely complete daily activities?: Yes Is the patient deaf or have difficulty hearing?: No Does the patient have difficulty seeing, even when wearing glasses/contacts?: Yes Does the patient have difficulty concentrating, remembering, or making decisions?: No Patient able to express need for assistance with ADLs?: Yes Does the patient have difficulty dressing or bathing?: Yes Independently performs ADLs?: Yes (appropriate for developmental age) Does the patient have difficulty walking or climbing stairs?: Yes Weakness of Legs: Both Weakness of Arms/Hands: Both  Admission diagnosis:  Tachycardia [R00.0] Elevated troponin [R77.8] SIRS (systemic inflammatory response syndrome) (HCC) [R65.10] Fatigue, unspecified type [R53.83] Patient Active Problem List   Diagnosis Date Noted  . Ileus ([place])   . Hypokalemia   . CKD (chronic kidney disease) stage 2, GFR 60-89 ml/min   . Polyarticular gout   . SIRS (systemic  inflammatory response syndrome) ([place]) 08/11/2020  . Elevated troponin 08/11/2020  . Generalized weakness 08/11/2020  . Uncontrolled type 2 diabetes mellitus with hyperglycemia, without long-term current use of insulin ([place]) 08/11/2020  . Acute deep vein thrombosis (DVT) of brachial vein of left upper extremity ([place]) 08/11/2020  . Nausea and vomiting 07/18/2020  . Tachycardia 07/11/2020  . Neurocognitive deficits 07/11/2020  . Non-traumatic rhabdomyolysis   . Arthralgia   . Left elbow pain   . Left hand pain   . AKI (acute kidney injury) ([place]) 06/28/2020  . Pain due to onychomycosis of toenails of both feet 12/17/2019  . Class 3 severe obesity due to excess calories with serious comorbidity and body mass index (BMI) of 45.0 to 49.9 in adult ([address]) 09/23/2018  . Diabetic neuropathy ([place]) 09/08/2017  . Insomnia 12/26/2016  . Scrotal abscess 10/09/2016  . Acute meniscal tear of right knee 12/29/2015  . Acute meniscal tear of left knee 12/29/2015  . Other specified crystal arthropathies, left knee 12/29/2015  . Other specified crystal arthropathies, right knee 12/29/2015  . Arthritis of knee   . Inability to ambulate due to knee   . Iron deficiency anemia   . Bilateral knee pain 12/23/2015  . Essential hypertension 12/23/2015  . Chronic mid back pain 12/23/2015  . Gout 12/23/2015  . Osteoarthritis of both knees 12/23/2015  . Intractable pain 12/23/2015  . Normocytic anemia 12/23/2015  . Gouty bursitis, elbow  10/18/2015  . DM (diabetes mellitus), type 2 with neurological complications ([place]) 40/98/1191   PCP:  Charlott Rakes, MD Pharmacy:   [id number] - [place], [place] [address]  [place] [address] Phone: [phone] Fax: [phone]   Social Determinants of Health (SDOH) Interventions    Readmission Risk Interventions No flowsheet data found.   Eula Fried, BSW, MSW, LCSW [email or web address] Phone: [phone]

## 2020-08-21 ENCOUNTER — Inpatient Hospital Stay: Payer: Self-pay

## 2020-08-21 ENCOUNTER — Inpatient Hospital Stay (HOSPITAL_COMMUNITY): Payer: Medicare HMO

## 2020-08-21 DIAGNOSIS — I82622 Acute embolism and thrombosis of deep veins of left upper extremity: Secondary | ICD-10-CM | POA: Diagnosis not present

## 2020-08-21 DIAGNOSIS — N182 Chronic kidney disease, stage 2 (mild): Secondary | ICD-10-CM | POA: Diagnosis not present

## 2020-08-21 DIAGNOSIS — M10041 Idiopathic gout, right hand: Secondary | ICD-10-CM | POA: Diagnosis not present

## 2020-08-21 DIAGNOSIS — I10 Essential (primary) hypertension: Secondary | ICD-10-CM | POA: Diagnosis not present

## 2020-08-21 LAB — GLUCOSE, CAPILLARY
Glucose-Capillary: 107 mg/dL — ABNORMAL HIGH (ref 70–99)
Glucose-Capillary: 122 mg/dL — ABNORMAL HIGH (ref 70–99)
Glucose-Capillary: 125 mg/dL — ABNORMAL HIGH (ref 70–99)
Glucose-Capillary: 128 mg/dL — ABNORMAL HIGH (ref 70–99)
Glucose-Capillary: 78 mg/dL (ref 70–99)
Glucose-Capillary: 98 mg/dL (ref 70–99)

## 2020-08-21 LAB — CBC
HCT: 28.2 % — ABNORMAL LOW (ref 39.0–52.0)
Hemoglobin: 8.5 g/dL — ABNORMAL LOW (ref 13.0–17.0)
MCH: 24.4 pg — ABNORMAL LOW (ref 26.0–34.0)
MCHC: 30.1 g/dL (ref 30.0–36.0)
MCV: 81 fL (ref 80.0–100.0)
Platelets: 272 10*3/uL (ref 150–400)
RBC: 3.48 MIL/uL — ABNORMAL LOW (ref 4.22–5.81)
RDW: 23 % — ABNORMAL HIGH (ref 11.5–15.5)
WBC: 3.6 10*3/uL — ABNORMAL LOW (ref 4.0–10.5)
nRBC: 0 % (ref 0.0–0.2)

## 2020-08-21 LAB — RENAL FUNCTION PANEL
Albumin: 2.3 g/dL — ABNORMAL LOW (ref 3.5–5.0)
Anion gap: 8 (ref 5–15)
BUN: 7 mg/dL (ref 6–20)
CO2: 28 mmol/L (ref 22–32)
Calcium: 7.7 mg/dL — ABNORMAL LOW (ref 8.9–10.3)
Chloride: 103 mmol/L (ref 98–111)
Creatinine, Ser: 0.63 mg/dL (ref 0.61–1.24)
GFR calc Af Amer: >60 mL/min (ref >60)
GFR calc non Af Amer: >60 mL/min (ref >60)
Glucose, Bld: 125 mg/dL — ABNORMAL HIGH (ref 70–99)
Phosphorus: 2.4 mg/dL — ABNORMAL LOW (ref 2.5–4.6)
Potassium: 3.4 mmol/L — ABNORMAL LOW (ref 3.5–5.1)
Sodium: 139 mmol/L (ref 135–145)

## 2020-08-21 LAB — HEPARIN LEVEL (UNFRACTIONATED)
Heparin Unfractionated: 0.24 IU/mL — ABNORMAL LOW (ref 0.30–0.70)
Heparin Unfractionated: 0.13 IU/mL — ABNORMAL LOW (ref 0.30–0.70)

## 2020-08-21 LAB — MAGNESIUM: Magnesium: 1.9 mg/dL (ref 1.7–2.4)

## 2020-08-21 MED ORDER — POTASSIUM CHLORIDE 10 MEQ/100ML IV SOLN
10.0000 meq | INTRAVENOUS | Status: AC
  Administered 2020-08-21 (×4): 10 meq via INTRAVENOUS
  Filled 2020-08-21 (×3): qty 100

## 2020-08-21 MED ORDER — HEPARIN (PORCINE) 25000 UT/250ML-% IV SOLN
1100.0000 [IU]/h | INTRAVENOUS | Status: DC
  Administered 2020-08-22 – 2020-08-25 (×4): 1100 [IU]/h via INTRAVENOUS
  Filled 2020-08-21 (×4): qty 250

## 2020-08-21 MED ORDER — HEPARIN BOLUS VIA INFUSION
2000.0000 [IU] | Freq: Once | INTRAVENOUS | Status: AC
  Administered 2020-08-21: 2000 [IU] via INTRAVENOUS
  Filled 2020-08-21: qty 2000

## 2020-08-21 MED ORDER — POTASSIUM PHOSPHATES 15 MMOLE/5ML IV SOLN
20.0000 mmol | Freq: Once | INTRAVENOUS | Status: AC
  Administered 2020-08-21: 20 mmol via INTRAVENOUS
  Filled 2020-08-21: qty 6.67

## 2020-08-21 MED ORDER — MAGNESIUM SULFATE 2 GM/50ML IV SOLN
2.0000 g | Freq: Once | INTRAVENOUS | Status: AC
  Administered 2020-08-21: 2 g via INTRAVENOUS
  Filled 2020-08-21: qty 50

## 2020-08-21 NOTE — Progress Notes (Signed)
Subjective: Feeling better.  Objective: Vital signs in last 24 hours: Temp:  [97.8 F (36.6 C)-98.8 F (37.1 C)] 97.8 F (36.6 C) (10/04 0500) Pulse Rate:  [78-85] 84 (10/04 0500) Resp:  [18-20] 19 (10/04 0500) BP: (120-137)/(86-103) 120/87 (10/04 0500) SpO2:  [95 %-98 %] 97 % (10/04 0500) Weight:  [108.4 kg] 108.4 kg (10/04 0500) Last BM Date: 08/20/20  Intake/Output from previous day: 10/03 0701 - 10/04 0700 In: 2324.5 [I.V.:1745.2; IV Piggyback:579.3] Out: 2000 [Urine:1650; Emesis/NG output:350] Intake/Output this shift: No intake/output data recorded.  General appearance: alert and no distress Resp: clear to auscultation bilaterally Cardio: regular rate and rhythm GI: distended, but softer and more bowel sounds Extremities: extremities normal, atraumatic, no cyanosis or edema  Lab Results: Recent Labs    08/19/20 0622 08/20/20 0624 08/21/20 0535  WBC 9.4 4.6 3.6*  HGB 9.1* 8.1* 8.5*  HCT 30.2* 26.6* 28.2*  PLT 425* 340 272   BMET Recent Labs    08/19/20 0622 08/20/20 0624 08/21/20 0535  NA 144 143 139  K 3.6 2.9* 3.4*  CL 109 101 103  CO2 23 29 28   GLUCOSE 179* 131* 125*  BUN 13 10 7   CREATININE 0.77 0.65 0.63  CALCIUM 8.5* 8.1* 7.7*   LFT Recent Labs    08/20/20 0624 08/20/20 0624 08/21/20 0535  PROT 5.4*  --   --   ALBUMIN 2.5*   < > 2.3*  AST 26  --   --   ALT 20  --   --   ALKPHOS 76  --   --   BILITOT 0.7  --   --    < > = values in this interval not displayed.   PT/INR No results for input(s): LABPROT, INR in the last 72 hours. Hepatitis Panel No results for input(s): HEPBSAG, HCVAB, HEPAIGM, HEPBIGM in the last 72 hours. C-Diff No results for input(s): CDIFFTOX in the last 72 hours. Fecal Lactopherrin No results for input(s): FECLLACTOFRN in the last 72 hours.  Studies/Results: DG Abd 1 View  Result Date: 08/21/2020 CLINICAL DATA:  Ileus EXAM: ABDOMEN - 1 VIEW COMPARISON:  08/20/2020 FINDINGS: NG tube tip appears  transpyloric, in the second segment of the duodenum. Diffuse gaseous distention of small bowel and colon is similar to prior. IMPRESSION: NG tube tip is now transpyloric, in the descending duodenum. Similar diffuse gaseous distention of small bowel and colon. Electronically Signed   By: Misty Stanley M.D.   On: 08/21/2020 06:40   DG Abd 1 View  Result Date: 08/20/2020 CLINICAL DATA:  Ileus. EXAM: ABDOMEN - 1 VIEW COMPARISON:  08/19/2020 FINDINGS: An enteric tube is unchanged with tip in the expected region of the distal stomach. Diffuse gaseous distension of small and large bowel loops is similar to the prior study. No acute osseous abnormality is identified. There is no gross consolidation in the included lung bases. IMPRESSION: Unchanged gaseous distension of small bowel and colon compatible with ileus. Electronically Signed   By: Logan Bores M.D.   On: 08/20/2020 05:53   DG Abd 1 View  Result Date: 08/19/2020 CLINICAL DATA:  Ileus. EXAM: ABDOMEN - 1 VIEW COMPARISON:  None. FINDINGS: Persistent gaseous distension of large and small bowel loops within the visualized abdomen, similar in appearance. Gastric tube is coiled within the stomach with the tip projecting peripelvic. IMPRESSION: Persistent diffuse gaseous distension of large and small bowel loops. Electronically Signed   By: Margaretha Sheffield MD   On: 08/19/2020 10:54    Medications:  Scheduled: . insulin glargine  7 Units Subcutaneous QHS  . metoCLOPramide (REGLAN) injection  5 mg Intravenous Q8H  . metoprolol tartrate  7.5 mg Intravenous Q6H   Continuous: . dextrose 5 % and 0.9 % NaCl with KCl 40 mEq/L 75 mL/hr at 08/21/20 0815  . heparin 700 Units/hr (08/20/20 1139)  . magnesium sulfate bolus IVPB    . potassium chloride    . potassium PHOSPHATE IVPB (in mmol)      Assessment/Plan: 1) Small bowel and colonic ileus. 2) DM.   The physical examination definitely shows an improvement.  In the past his abdomen was firm, but now it  is softer.  The bowel sounds are more audible and frequent.  He is having bowel movements and he does report feeling better.  Plan: 1) Continue with NG tube. 2) Continue with movement in bed and ambulation. 3) Monitor and correct any electrolyte abnormalities.  LOS: 9 days   Christian Borgerding D 08/21/2020, 8:17 AM

## 2020-08-21 NOTE — Progress Notes (Signed)
[place] for IV heparin  Indication: history of DVT  Allergies  Allergen Reactions  . Hctz [Hydrochlorothiazide]     Hx of gout; uric acid 14.1 on 03/13/20 Acute gout L elbow 8/11-8/24/21    Patient Measurements: Height: 5\' 5"  (165.1 cm) Weight: 108.4 kg (238 lb 15.7 oz) IBW/kg (Calculated) : 61.5 Heparin Dosing Weight: 85.7 kg  Vital Signs: Temp: 98 F (36.7 C) (10/04 1259) Temp Source: Oral (10/04 1259) BP: 138/97 (10/04 1259) Pulse Rate: 73 (10/04 1259)  Labs: Recent Labs    08/19/20 0622 08/19/20 0622 08/20/20 0624 08/21/20 0535 08/21/20 1726  HGB 9.1*   < > 8.1* 8.5*  --   HCT 30.2*  --  26.6* 28.2*  --   PLT 425*  --  340 272  --   HEPARINUNFRC 0.44   < > 0.33 0.24* 0.13*  CREATININE 0.77  --  0.65 0.63  --    < > = values in this interval not displayed.    Estimated Creatinine Clearance: 111.5 mL/min (by C-G formula based on SCr of 0.63 mg/dL).   Assessment: 48 y/oM with PMH of DVT (07/03/2020) on Apixaban PTA admitted on 08/10/2020 with SIRS. Given worsening abdominal distention, CT abd pelvis done today, which revealed a colonic ileus with pneumatosis of the ascending and hepatic flexure of the colon. PO meds held. Pharmacy consulted for IV heparin dosing while Apixaban on hold. Last dose of Apixaban 5mg  PO given this morning at 0921. Note that recent Apixaban use may falsely elevate heparin levels.   Baseline labs: Hg low at 8.2, pltc WNL, Heparin level >2.2, INR 1.5, aPTT 39 se, Scr 1.08  Today, 08/21/2020:  HL 0.13, subtherapeutic and continues to trend down despite rate increase  Infusion paused ~10 min for level to be drawn from right hand- distal to infusion site   Hg low, down some; pltc wnl  No bleeding or infusion related concerns reported by RN  Still with unresolved ileus, unable to resume Eliquis  Goal of Therapy:  Heparin level 0.3-0.7 units/ml Monitor platelets by anticoagulation protocol: Yes    Plan:   Heparin 2000 units IV bolus x1 then increase heparin infusion to 950 units/hr  Obtain HL 6 hours after rate change   Daily CBC and heparin level   Monitor closely for s/sx of bleeding, ability to resume Eliquis   Netta Cedars, PharmD, BCPS 08/21/2020 6:17 PM

## 2020-08-21 NOTE — Progress Notes (Signed)
[place] for IV heparin  Indication: history of DVT  Allergies  Allergen Reactions  . Hctz [Hydrochlorothiazide]     Hx of gout; uric acid 14.1 on 03/13/20 Acute gout L elbow 8/11-8/24/21    Patient Measurements: Height: 5\' 5"  (165.1 cm) Weight: 108.4 kg (238 lb 15.7 oz) IBW/kg (Calculated) : 61.5 Heparin Dosing Weight: 85.7 kg  Vital Signs: Temp: 97.8 F (36.6 C) (10/04 0500) Temp Source: Oral (10/04 0500) BP: 120/87 (10/04 0500) Pulse Rate: 84 (10/04 0500)  Labs: Recent Labs    08/19/20 0622 08/19/20 0622 08/20/20 0624 08/21/20 0535  HGB 9.1*   < > 8.1* 8.5*  HCT 30.2*  --  26.6* 28.2*  PLT 425*  --  340 272  HEPARINUNFRC 0.44  --  0.33 0.24*  CREATININE 0.77  --  0.65 0.63   < > = values in this interval not displayed.    Estimated Creatinine Clearance: 111.5 mL/min (by C-G formula based on SCr of 0.63 mg/dL).   Assessment: 89 y/oM with PMH of DVT (07/03/2020) on Apixaban PTA admitted on 08/10/2020 with SIRS. Given worsening abdominal distention, CT abd pelvis done today, which revealed a colonic ileus with pneumatosis of the ascending and hepatic flexure of the colon. PO meds held. Pharmacy consulted for IV heparin dosing while Apixaban on hold. Last dose of Apixaban 5mg  PO given this morning at 0921. Note that recent Apixaban use may falsely elevate heparin levels.   Baseline labs: Hg low at 8.2, pltc WNL, Heparin level >2.2, INR 1.5, aPTT 39 se, Scr 1.08  Today, 08/21/2020:  HL 0.24, subtherapeutic  Infusion not paused per RN  Hg low, down some; pltc wnl  No bleeding or infusion related concerns reported by RN  Still with unresolved ileus, unable to resume Eliquis  Goal of Therapy:  Heparin level 0.3-0.7 units/ml Monitor platelets by anticoagulation protocol: Yes   Plan:   Increase heparin infusion slightly to 750 units/hr  Obtain HL 6 hours after rate change   Daily CBC and heparin level   Monitor closely  for s/sx of bleeding, ability to resume Eliquis   Royetta Asal, PharmD, BCPS 08/21/2020 9:57 AM

## 2020-08-21 NOTE — Progress Notes (Signed)
PROGRESS NOTE    [name]  JZP:[id number] DOB: [birth year] DOA: 08/10/2020 PCP: Charlott Rakes, MD    Chief Complaint  Patient presents with  . Leg Swelling  . Medical Clearance    rehab placement     Brief Narrative:  59 year old black male with known history of IDDM since 2012, HTN, chronic gout Prior scrotal abscess Rx 2017 drained at that time Severe onychomycosis Severe tricompartmental osteoarthritis both knees He is status post bilateral arthroscopies with meniscectomies by Dr. Berenice Primas in 2017 He has had work-up in the past found to be ANA negative RF negative CCP negative and felt to be a combination of his chronic gout and possible osteoarthritis He was recently hospitalized 8/11 through 8/24 with AKI found at that time to have left upper extremity DVT in addition to polyarticular gout He also came in with difficulty with urination found to have SIRS criteria with fever 102.8-CT chest no embolism no pneumonia urine analysis showed negative leukocytes negative nitrites However urine culture grew E. coli He has had intractable pain in upper extremities arms and swelling of his joints and he does not remember ever getting tapped on his joints but on review of chart as above it looks that he has been thoroughly worked up in the past for other rheumatological disease process and was supposed to follow-up with Dr. Estanislado Pandy rheumatology in the outpatient setting-it is unclear if this was ever done   Assessment & Plan:   Principal Problem:   SIRS (systemic inflammatory response syndrome) ([place]) Active Problems:   Essential hypertension   Gout   Iron deficiency anemia   Elevated troponin   Generalized weakness   Uncontrolled type 2 diabetes mellitus with hyperglycemia, without long-term current use of insulin ([place])   Acute deep vein thrombosis (DVT) of brachial vein of left upper extremity (HCC)   Ileus (HCC)   Hypokalemia   CKD (chronic kidney disease) stage 2, GFR  60-89 ml/min   Polyarticular gout   Nasogastric tube present   Acidosis, metabolic  1 colonic and small bowel 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  Patient stated had a large bowel movement on 08/15/2020, passing flatus.  Patient stated had a large bowel movement (08/18/2020) however per RN patient with mucus.  Patient also with some nausea and emesis on 08/18/2020. Per RN patient has not had any significant bowel movement since 08/15/2020. Patient stated ambulated in hallways on 08/15/2020 with therapy. Abdominal films done with stable diffuse colonic dilatation is noted most consistent with colonic ileus/adynamic ileus with progression and colonic pneumatosis..  CT abdomen and pelvis done 08/14/2020 with findings consistent with colonic ileus, pneumatosis of the ascending and hepatic flexure of the colon.  No associated colonic wall thickening or portal venous gas.  Normal small bowel without obstruction.  Hepatic steatosis.  Bilateral renal parenchymal atrophy with lobular contours.  Mild bladder wall thickening.  Patient with no significant change with abdominal distention.  Abdominal films with persistent gaseous distention of large and small bowel loops.  Patient seen by GI and NG tube ordered however unable to be placed per RN.  NG tube placed by GI under guidewire.  Per RN after NG tube placed patient with some relief of abdominal discomfort.  General surgery was following and initially recommended mobilization and GI decompression.  General surgery ordered neostigmine and per RN patient noted to have watery loose stools after neostigmine was given.  Rectal tube has been ordered per GI.  Repeat abdominal films with unchanged gaseous distention of small bowel and colon compatible with ileus.  NG tube with output of 350 cc over the past 24 hours.  Keep  potassium > 4, magnesium > 2.  Currently n.p.o.  Supportive care.  PICC line to be placed.  We will start TPN.  GI and general surgery following.  2.  Polyarticular gout with osteoarthritis It is noted that patient had previous work-up with rheumatoid factor and ANA which were negative.  Steroids currently on hold.  Allopurinol discontinued on admission.  Colchicine on hold.  Likely needs SNF.  Will need outpatient follow-up with rheumatology as previously scheduled.  3.  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  Patient noted to have been on 70/30 insulin 50 units twice daily.  CBG 125 this morning.  CBG was 64 the morning of 08/16/2020 and as such Lantus changed to 7 units nightly.  Continue current regimen of Lantus and sliding scale insulin.  IV Reglan.  Patient to be started on TPN hopefully tomorrow.  4.  Hypokalemia/hypomagnesemia Potassium at 3.4.  Magnesium at 1.9.  Phosphorus at 2.4.  Replete phosphorus and potassium.  Magnesium 2 g IV x1.  Repeat labs in the morning.  5.  Left upper extremity DVT Was on apixaban twice daily.  Patient currently n.p.o. due to colonic ileus.  IV heparin for anticoagulation.  Once on a diet consistently will resume apixaban.  Follow.   6.  Iron deficiency anemia Status post IV Feraheme 08/12/2020.  Once on the diet will need to resume oral iron supplementation oral discharge.  Hemoglobin currently at 8.5.  Likely dilutional effect as well.  Patient with no overt bleeding.  Follow H&H. Transfusion threshold hemoglobin < 7.  7.  Prior bilateral knee surgeries PT/OT.  SNF placement.  8.  Chronic kidney disease stage II Stable.  Follow.  9.  E. coli urinary tract infection Urine cultures > 100,000 colonies of E. coli.  Was on Keflex and subsequently transition to IV Ancef as patient noted to be NPO.  Status post 7 - 8 days of antibiotics.  Antibiotics discontinued.  No further antibiotics needed at this time.    10.  Metabolic acidosis Patient noted to be acidotic.  Concern for abdominal ischemia as patient with significant colonic ileus with no significant improvement.  Lactic acid level within normal limits.  Acidosis resolved with bicarb drip.  Bicarb drip has been discontinued.  Continue saline.  Follow.  11.  Nutrition Due to patient's colonic distention/ileus patient has been n.p.o. for several days.  Patient with NG tube and rectal tube.  Discussed with GI.  Order PICC line.  Start TPN.  12.  Severe onchogryphosis   DVT prophylaxis: Heparin Code Status: Full Family Communication: Updated patient.  Disposition:   Status is: Inpatient    Dispo:  Patient From: Home  Planned Disposition: [place]  Expected discharge date: 08/24/20  Medically stable for discharge: No        Consultants:   Gastroenterology: Dr. Collene Mares 08/14/2020  General surgery: Dr. Hassell Done 08/14/2020  Procedures:   CT angiogram chest 08/10/2020  CT abdomen and pelvis 08/14/2020  2D echo 08/11/2020  Abdominal films 08/18/2020, 08/17/2020, 08/16/2020, 08/14/2020,   Antimicrobials:   Keflex 08/12/2020>>>> 08/14/2020  IV Ancef 08/15/2020>>>> 08/19/2020  IV Rocephin 08/14/2020>>> 08/15/2020   Subjective: Patient laying in bed.  Denies any chest pain.  No shortness of breath.  Feels abdominal discomfort/pain is  improving.  Feels abdominal distention is improving.  Having liquid stools.  NG tube with output.   Objective: Vitals:   08/20/20 1100 08/20/20 1300 08/20/20 2200 08/21/20 0500  BP: (!) 137/103 134/86 (!) 129/95 120/87  Pulse: 83 78 85 84  Resp:  18 20 19   Temp:   98.8 F (37.1 C) 97.8 F (36.6 C)  TempSrc:   Oral Oral  SpO2: 95% 96% 98% 97%  Weight:    108.4 kg  Height:        Intake/Output Summary (Last 24 hours) at 08/21/2020 1150 Last data filed at 08/21/2020 0550 Gross per 24 hour  Intake 2320.02 ml  Output 2000 ml  Net 320.02 ml   Filed Weights   08/11/20 0321 08/20/20  0600 08/21/20 0500  Weight: 106.2 kg 98.7 kg 108.4 kg    Examination:  General exam: NAD.  NG tube in place. Respiratory system: CTAB.  No wheezes, no crackles, no rhonchi.  Normal respiratory effort.  Fair air movement.   Cardiovascular system: RRR no murmurs rubs or gallops.  No JVD.  No lower extremity edema.  Gastrointestinal system: Abdomen soft, distended, less tight, positive bowel sounds, nontender to palpation. Central nervous system: Alert and oriented. No focal neurological deficits. Extremities: Symmetric 5 x 5 power. Skin: No rashes, lesions or ulcers Psychiatry: Judgement and insight appear normal. Mood & affect appropriate.     Data Reviewed: I have personally reviewed following labs and imaging studies  CBC: Recent Labs  Lab 08/15/20 0545 08/16/20 0505 08/17/20 0446 08/18/20 0506 08/19/20 0622 08/20/20 0624 08/21/20 0535  WBC 10.7*   < > 9.9 9.7 9.4 4.6 3.6*  NEUTROABS 7.2  --   --   --   --   --   --   HGB 9.1*   < > 9.1* 9.7* 9.1* 8.1* 8.5*  HCT 29.6*   < > 30.3* 32.7* 30.2* 26.6* 28.2*  MCV 78.5*   < > 80.4 80.7 80.1 79.9* 81.0  PLT 449*   < > 465* 488* 425* 340 272   < > = values in this interval not displayed.    Basic Metabolic Panel: Recent Labs  Lab 08/17/20 0446 08/18/20 0506 08/19/20 0622 08/20/20 0624 08/21/20 0535  NA 139 140 144 143 139  K 4.1 4.6 3.6 2.9* 3.4*  CL 112* 112* 109 101 103  CO2 18* 16* 23 29 28   GLUCOSE 96 126* 179* 131* 125*  BUN 18 17 13 10 7   CREATININE 0.84 0.85 0.77 0.65 0.63  CALCIUM 8.5* 8.6* 8.5* 8.1* 7.7*  MG 1.8 2.3 2.0 1.7 1.9  PHOS 2.8  --   --   --  2.4*    GFR: Estimated Creatinine Clearance: 111.5 mL/min (by C-G formula based on SCr of 0.63 mg/dL).  Liver Function Tests: Recent Labs  Lab 08/15/20 0545 08/16/20 0505 08/17/20 0446 08/20/20 0624 08/21/20 0535  AST 31 22  --  26  --   ALT 52* 39  --  20  --   ALKPHOS 88 90  --  76  --   BILITOT 0.5 0.5  --  0.7  --   PROT 6.6 6.4*  --  5.4*   --   ALBUMIN 2.6* 2.6* 2.8* 2.5* 2.3*    CBG: Recent Labs  Lab 08/20/20 1626 08/20/20 2003 08/21/20 0001 08/21/20 0357 08/21/20 0718  GLUCAP 108* 154* 128* 122* 125*     No results found for this or any previous visit (from the past 240  hour(s)).       Radiology Studies: DG Abd 1 View  Result Date: 08/21/2020 CLINICAL DATA:  Ileus EXAM: ABDOMEN - 1 VIEW COMPARISON:  08/20/2020 FINDINGS: NG tube tip appears transpyloric, in the second segment of the duodenum. Diffuse gaseous distention of small bowel and colon is similar to prior. IMPRESSION: NG tube tip is now transpyloric, in the descending duodenum. Similar diffuse gaseous distention of small bowel and colon. Electronically Signed   By: Misty Stanley M.D.   On: 08/21/2020 06:40   DG Abd 1 View  Result Date: 08/20/2020 CLINICAL DATA:  Ileus. EXAM: ABDOMEN - 1 VIEW COMPARISON:  08/19/2020 FINDINGS: An enteric tube is unchanged with tip in the expected region of the distal stomach. Diffuse gaseous distension of small and large bowel loops is similar to the prior study. No acute osseous abnormality is identified. There is no gross consolidation in the included lung bases. IMPRESSION: Unchanged gaseous distension of small bowel and colon compatible with ileus. Electronically Signed   By: Logan Bores M.D.   On: 08/20/2020 05:53        Scheduled Meds: . insulin glargine  7 Units Subcutaneous QHS  . metoCLOPramide (REGLAN) injection  5 mg Intravenous Q8H  . metoprolol tartrate  7.5 mg Intravenous Q6H   Continuous Infusions: . dextrose 5 % and 0.9 % NaCl with KCl 40 mEq/L 75 mL/hr at 08/21/20 0815  . heparin 750 Units/hr (08/21/20 1117)  . magnesium sulfate bolus IVPB 2 g (08/21/20 1115)  . potassium chloride 10 mEq (08/21/20 1112)  . potassium PHOSPHATE IVPB (in mmol) Stopped (08/21/20 1049)     LOS: 9 days    Time spent: 40 minutes    Irine Seal, MD [facility]   To contact the attending provider  between 7A-7P or the covering provider during after hours 7P-7A, please log into the web site [email or web address] and access using universal [place] password for that web site. If you do not have the password, please call the hospital operator.  08/21/2020, 11:50 AM

## 2020-08-21 NOTE — Care Management Important Message (Signed)
Important Message  Patient Details IM Letter given to the Patient Name: [name] MRN: [id number] Date of Birth: September 19, 1961   Medicare Important Message Given:  Yes     Kerin Salen 08/21/2020, 1:27 PM

## 2020-08-21 NOTE — TOC Progression Note (Signed)
Transition of Care [facility]) - Progression Note    Patient Details  Name: SURYA SCHROETER MRN: [id number] Date of Birth: 1961-01-16  Transition of Care [facility]) CM/SW Contact  Keylee Shrestha, Juliann Pulse, RN Phone Number: 08/21/2020, 3:25 PM  Clinical Narrative:[facility] chosen by Jeanette-significant other per patient permission-[facility] rep Juliann Pulse following-awaiting medical stability, will start auth once closer to d/c. Currently hep gtt,rectal tube,NGT.       Expected Discharge Plan: [place] Barriers to Discharge: Continued Medical Work up  Expected Discharge Plan and Services Expected Discharge Plan: [place]   Discharge Planning Services: CM Consult Post Acute Care Choice: [place] Living arrangements for the past 2 months: Single Family Home                                       Social Determinants of Health (SDOH) Interventions    Readmission Risk Interventions No flowsheet data found.

## 2020-08-21 NOTE — TOC Progression Note (Signed)
Transition of Care [facility]) - Progression Note    Patient Details  Name: [name] MRN: [id number] Date of Birth: [birth year]  Transition of Care [facility]) CM/SW Contact  Jeriann Sayres, Juliann Pulse, RN Phone Number: 08/21/2020, 12:07 PM  Clinical Narrative: Damaris Schooner to patient/left vm w/Jeanette-informed of importance of choosing a facility for SNF-await choice.           Expected Discharge Plan and Services                                                 Social Determinants of Health (SDOH) Interventions    Readmission Risk Interventions No flowsheet data found.

## 2020-08-21 NOTE — Progress Notes (Signed)
[facility] Progress Note     Subjective: Patient reports he is mobilizing much more and having more bowel function. Denies abdominal pain.   Objective: Vital signs in last 24 hours: Temp:  [97.8 F (36.6 C)-98.8 F (37.1 C)] 97.8 F (36.6 C) (10/04 0500) Pulse Rate:  [78-85] 84 (10/04 0500) Resp:  [18-20] 19 (10/04 0500) BP: (120-137)/(86-103) 120/87 (10/04 0500) SpO2:  [95 %-98 %] 97 % (10/04 0500) Weight:  [108.4 kg] 108.4 kg (10/04 0500) Last BM Date: 08/20/20  Intake/Output from previous day: 10/03 0701 - 10/04 0700 In: 2324.5 [I.V.:1745.2; IV Piggyback:579.3] Out: 2000 [Urine:1650; Emesis/NG output:350] Intake/Output this shift: No intake/output data recorded.  PE: Abd: softer, NT, distended, more normoactive BS, NGT with bilious drainage    Lab Results:  Recent Labs    08/20/20 0624 08/21/20 0535  WBC 4.6 3.6*  HGB 8.1* 8.5*  HCT 26.6* 28.2*  PLT 340 272   BMET Recent Labs    08/20/20 0624 08/21/20 0535  NA 143 139  K 2.9* 3.4*  CL 101 103  CO2 29 28  GLUCOSE 131* 125*  BUN 10 7  CREATININE 0.65 0.63  CALCIUM 8.1* 7.7*   PT/INR No results for input(s): LABPROT, INR in the last 72 hours. CMP     Component Value Date/Time   NA 139 08/21/2020 0535   NA 131 (A) 07/23/2020 0000   K 3.4 (L) 08/21/2020 0535   CL 103 08/21/2020 0535   CO2 28 08/21/2020 0535   GLUCOSE 125 (H) 08/21/2020 0535   BUN 7 08/21/2020 0535   BUN 36 (A) 07/23/2020 0000   CREATININE 0.63 08/21/2020 0535   CREATININE 0.95 10/18/2016 1640   CALCIUM 7.7 (L) 08/21/2020 0535   CALCIUM 9.0 12/28/2015 0726   PROT 5.4 (L) 08/20/2020 0624   PROT 7.3 03/13/2020 1420   ALBUMIN 2.3 (L) 08/21/2020 0535   ALBUMIN 4.7 03/13/2020 1420   AST 26 08/20/2020 0624   ALT 20 08/20/2020 0624   ALKPHOS 76 08/20/2020 0624   BILITOT 0.7 08/20/2020 0624   BILITOT 0.4 03/13/2020 1420   GFRNONAA >60 08/21/2020 0535   GFRNONAA 70 10/13/2013 1054   GFRAA >60 08/21/2020 0535    GFRAA 80 10/13/2013 1054   Lipase     Component Value Date/Time   LIPASE 153 (H) 06/14/2011 2309       Studies/Results: DG Abd 1 View  Result Date: 08/21/2020 CLINICAL DATA:  Ileus EXAM: ABDOMEN - 1 VIEW COMPARISON:  08/20/2020 FINDINGS: NG tube tip appears transpyloric, in the second segment of the duodenum. Diffuse gaseous distention of small bowel and colon is similar to prior. IMPRESSION: NG tube tip is now transpyloric, in the descending duodenum. Similar diffuse gaseous distention of small bowel and colon. Electronically Signed   By: Misty Stanley M.D.   On: 08/21/2020 06:40   DG Abd 1 View  Result Date: 08/20/2020 CLINICAL DATA:  Ileus. EXAM: ABDOMEN - 1 VIEW COMPARISON:  08/19/2020 FINDINGS: An enteric tube is unchanged with tip in the expected region of the distal stomach. Diffuse gaseous distension of small and large bowel loops is similar to the prior study. No acute osseous abnormality is identified. There is no gross consolidation in the included lung bases. IMPRESSION: Unchanged gaseous distension of small bowel and colon compatible with ileus. Electronically Signed   By: Logan Bores M.D.   On: 08/20/2020 05:53   DG Abd 1 View  Result Date: 08/19/2020 CLINICAL DATA:  Ileus. EXAM: ABDOMEN - 1 VIEW  COMPARISON:  None. FINDINGS: Persistent gaseous distension of large and small bowel loops within the visualized abdomen, similar in appearance. Gastric tube is coiled within the stomach with the tip projecting peripelvic. IMPRESSION: Persistent diffuse gaseous distension of large and small bowel loops. Electronically Signed   By: Margaretha Sheffield MD   On: 08/19/2020 10:54    Anti-infectives: Anti-infectives (From admission, onward)   Start     Dose/Rate Route Frequency Ordered Stop   08/15/20 1600  ceFAZolin (ANCEF) IVPB 1 g/50 mL premix        1 g 100 mL/hr over 30 Minutes Intravenous Every 8 hours 08/15/20 1429 08/19/20 2243   08/14/20 1730  cefTRIAXone (ROCEPHIN) 1 g in  sodium chloride 0.9 % 100 mL IVPB  Status:  Discontinued        1 g 200 mL/hr over 30 Minutes Intravenous Every 24 hours 08/14/20 1647 08/15/20 1429   08/12/20 1230  cephALEXin (KEFLEX) capsule 500 mg  Status:  Discontinued        500 mg Oral Every 12 hours 08/12/20 1139 08/14/20 1647       Assessment/Plan Gout Osteoarthritis Type 2 diabetes- SSI Left upper extremity DVT on anticoagulation- heparin gtt Anemia- hgb 8.5, stable Hypokalemia/Hypomagnesemia- goal K >4.0 and Mg >2.0 to maximize bowel function   Colonic ileus - patient still distended but softer than previously - NGT and diet advancement per GI - s/p neostigmine 10/1 - no surgical indications at this point unless he perforates or has ischemia, which he does not have evidence of at this point - general surgery will sign off at this time, please re-consult if we can be of further assistance  FEN: IV fluids, NPO, NGT to LIWS ID: Keflex 9/25-9/27; ceftriaxone x1 9/27 DVT: Heparin drip  LOS: 9 days    Norm Parcel , [facility] 08/21/2020, 8:48 AM Please see Amion for pager number during day hours 7:00am-4:30pm

## 2020-08-22 ENCOUNTER — Inpatient Hospital Stay (HOSPITAL_COMMUNITY): Payer: Medicare HMO

## 2020-08-22 DIAGNOSIS — R9431 Abnormal electrocardiogram [ECG] [EKG]: Secondary | ICD-10-CM

## 2020-08-22 DIAGNOSIS — I82622 Acute embolism and thrombosis of deep veins of left upper extremity: Secondary | ICD-10-CM | POA: Diagnosis not present

## 2020-08-22 DIAGNOSIS — N182 Chronic kidney disease, stage 2 (mild): Secondary | ICD-10-CM | POA: Diagnosis not present

## 2020-08-22 DIAGNOSIS — M10041 Idiopathic gout, right hand: Secondary | ICD-10-CM | POA: Diagnosis not present

## 2020-08-22 DIAGNOSIS — I10 Essential (primary) hypertension: Secondary | ICD-10-CM | POA: Diagnosis not present

## 2020-08-22 LAB — GLUCOSE, CAPILLARY
Glucose-Capillary: 101 mg/dL — ABNORMAL HIGH (ref 70–99)
Glucose-Capillary: 112 mg/dL — ABNORMAL HIGH (ref 70–99)
Glucose-Capillary: 116 mg/dL — ABNORMAL HIGH (ref 70–99)
Glucose-Capillary: 121 mg/dL — ABNORMAL HIGH (ref 70–99)
Glucose-Capillary: 125 mg/dL — ABNORMAL HIGH (ref 70–99)
Glucose-Capillary: 155 mg/dL — ABNORMAL HIGH (ref 70–99)

## 2020-08-22 LAB — COMPREHENSIVE METABOLIC PANEL
ALT: 22 U/L (ref 0–44)
AST: 27 U/L (ref 15–41)
Albumin: 2.4 g/dL — ABNORMAL LOW (ref 3.5–5.0)
Alkaline Phosphatase: 76 U/L (ref 38–126)
Anion gap: 11 (ref 5–15)
BUN: 7 mg/dL (ref 6–20)
CO2: 26 mmol/L (ref 22–32)
Calcium: 7.9 mg/dL — ABNORMAL LOW (ref 8.9–10.3)
Chloride: 102 mmol/L (ref 98–111)
Creatinine, Ser: 0.71 mg/dL (ref 0.61–1.24)
GFR calc Af Amer: >60 mL/min (ref >60)
GFR calc non Af Amer: >60 mL/min (ref >60)
Glucose, Bld: 120 mg/dL — ABNORMAL HIGH (ref 70–99)
Potassium: 3.7 mmol/L (ref 3.5–5.1)
Sodium: 139 mmol/L (ref 135–145)
Total Bilirubin: 0.8 mg/dL (ref 0.3–1.2)
Total Protein: 5.4 g/dL — ABNORMAL LOW (ref 6.5–8.1)

## 2020-08-22 LAB — CBC
HCT: 27.7 % — ABNORMAL LOW (ref 39.0–52.0)
Hemoglobin: 8.3 g/dL — ABNORMAL LOW (ref 13.0–17.0)
MCH: 24.4 pg — ABNORMAL LOW (ref 26.0–34.0)
MCHC: 30 g/dL (ref 30.0–36.0)
MCV: 81.5 fL (ref 80.0–100.0)
Platelets: 253 10*3/uL (ref 150–400)
RBC: 3.4 MIL/uL — ABNORMAL LOW (ref 4.22–5.81)
RDW: 23 % — ABNORMAL HIGH (ref 11.5–15.5)
WBC: 4.8 10*3/uL (ref 4.0–10.5)
nRBC: 0 % (ref 0.0–0.2)

## 2020-08-22 LAB — DIFFERENTIAL
Abs Immature Granulocytes: 0.04 10*3/uL (ref 0.00–0.07)
Basophils Absolute: 0 10*3/uL (ref 0.0–0.1)
Basophils Relative: 0 %
Eosinophils Absolute: 0 10*3/uL (ref 0.0–0.5)
Eosinophils Relative: 1 %
Immature Granulocytes: 1 %
Lymphocytes Relative: 20 %
Lymphs Abs: 1 10*3/uL (ref 0.7–4.0)
Monocytes Absolute: 0.5 10*3/uL (ref 0.1–1.0)
Monocytes Relative: 11 %
Neutro Abs: 3.2 10*3/uL (ref 1.7–7.7)
Neutrophils Relative %: 67 %

## 2020-08-22 LAB — PREALBUMIN: Prealbumin: 6.5 mg/dL — ABNORMAL LOW (ref 18–38)

## 2020-08-22 LAB — TRIGLYCERIDES: Triglycerides: 94 mg/dL (ref <150)

## 2020-08-22 LAB — HEPARIN LEVEL (UNFRACTIONATED)
Heparin Unfractionated: 0.28 IU/mL — ABNORMAL LOW (ref 0.30–0.70)
Heparin Unfractionated: 0.35 IU/mL (ref 0.30–0.70)
Heparin Unfractionated: 0.4 IU/mL (ref 0.30–0.70)

## 2020-08-22 LAB — PHOSPHORUS: Phosphorus: 3.6 mg/dL (ref 2.5–4.6)

## 2020-08-22 LAB — MAGNESIUM: Magnesium: 1.8 mg/dL (ref 1.7–2.4)

## 2020-08-22 MED ORDER — CHLORHEXIDINE GLUCONATE CLOTH 2 % EX PADS
6.0000 | MEDICATED_PAD | Freq: Every day | CUTANEOUS | Status: DC
  Administered 2020-08-22 – 2020-09-11 (×21): 6 via TOPICAL

## 2020-08-22 MED ORDER — MAGNESIUM SULFATE 4 GM/100ML IV SOLN
4.0000 g | Freq: Once | INTRAVENOUS | Status: AC
  Administered 2020-08-22: 4 g via INTRAVENOUS
  Filled 2020-08-22: qty 100

## 2020-08-22 MED ORDER — KCL IN DEXTROSE-NACL 40-5-0.9 MEQ/L-%-% IV SOLN
INTRAVENOUS | Status: AC
  Filled 2020-08-22: qty 1000

## 2020-08-22 MED ORDER — SODIUM CHLORIDE 0.9% FLUSH
10.0000 mL | Freq: Two times a day (BID) | INTRAVENOUS | Status: DC
  Administered 2020-08-25 – 2020-09-07 (×9): 10 mL

## 2020-08-22 MED ORDER — POTASSIUM CHLORIDE 10 MEQ/100ML IV SOLN: 10.0000 meq | INTRAVENOUS | Status: DC

## 2020-08-22 MED ORDER — POTASSIUM CHLORIDE 10 MEQ/100ML IV SOLN
10.0000 meq | Freq: Once | INTRAVENOUS | Status: AC
  Administered 2020-08-22: 10 meq via INTRAVENOUS

## 2020-08-22 MED ORDER — TRAVASOL 10 % IV SOLN
INTRAVENOUS | Status: AC
  Filled 2020-08-22 (×2): qty 480

## 2020-08-22 MED ORDER — POTASSIUM CHLORIDE 10 MEQ/100ML IV SOLN
10.0000 meq | INTRAVENOUS | Status: AC
  Administered 2020-08-22 (×2): 10 meq via INTRAVENOUS
  Filled 2020-08-22: qty 100

## 2020-08-22 MED ORDER — INSULIN ASPART 100 UNIT/ML ~~LOC~~ SOLN
0.0000 [IU] | Freq: Three times a day (TID) | SUBCUTANEOUS | Status: DC
  Administered 2020-08-22: 2 [IU] via SUBCUTANEOUS
  Administered 2020-08-23: 1 [IU] via SUBCUTANEOUS
  Administered 2020-08-23: 2 [IU] via SUBCUTANEOUS
  Administered 2020-08-23: 1 [IU] via SUBCUTANEOUS
  Administered 2020-08-24 (×3): 2 [IU] via SUBCUTANEOUS
  Administered 2020-08-25: 3 [IU] via SUBCUTANEOUS

## 2020-08-22 NOTE — Progress Notes (Signed)
Physical Therapy Treatment Patient Details Name: [name] MRN: [id number] DOB: [birth year] Today's Date: 08/22/2020    History of Present Illness 59 yo male admitted to [facility] on 9/24 with weakness, joint pain, difficulty urinating. Pt meeting SIRS criteria, possibly secondary to urinary infection. NGT placed 10/1 for ileus. PMHx significant for chronic gouty arthritis of multiple joints, CKD II, IDDM, HTN, HLD, and chronic anemia. Admitted to [facility] 8/11-8/24 for AKI, LUE DVT, and joint pain, d/c SNF.    PT Comments    Per RN pt was OOB in recliner earlier to day for several hours.  Pt currently back in bed.  Assisted with OOB and standing to change bed due to leaking condom cath.  General bed mobility comments: increased time and use of bed rail.  General transfer comment: pt with increased ability to self rise to a standing position and take a few side steps to [facility] using walker. HR increased to 140's.  Limited stance time due to fatigue/weakness.   Assisted back to bed and positioned to comfort.    Follow Up Recommendations  SNF     Equipment Recommendations  None recommended by PT    Recommendations for Other Services       Precautions / Restrictions Precautions Precautions: Fall Precaution Comments: NG tube    Mobility  Bed Mobility Overal bed mobility: Needs Assistance Bed Mobility: Supine to Sit;Sit to Supine     Supine to sit: Min assist Sit to supine: Mod assist   General bed mobility comments: increased time and use of bed rail  Transfers Overall transfer level: Needs assistance Equipment used: Rolling walker (2 wheeled) Transfers: Sit to/from Stand Sit to Stand: Min assist;Mod assist         General transfer comment: pt with increased ability to self rise to a standing position and take a few side steps to [facility] using walker.  Ambulation/Gait                 Stairs             Wheelchair Mobility    Modified Rankin (Stroke Patients Only)        Balance                                            Cognition Arousal/Alertness: Awake/alert Behavior During Therapy: Flat affect Overall Cognitive Status: Within Functional Limits for tasks assessed                                 General Comments: AxO x 2 following directions with slight delay      Exercises      General Comments        Pertinent Vitals/Pain Pain Assessment: Faces Faces Pain Scale: Hurts a little bit Pain Location: ABD Pain Descriptors / Indicators: Grimacing    Home Living                      Prior Function            PT Goals (current goals can now be found in the care plan section) Progress towards PT goals: Progressing toward goals    Frequency    Min 2X/week      PT Plan Current plan remains appropriate    Co-evaluation  AM-PAC PT "6 Clicks" Mobility   Outcome Measure  Help needed turning from your back to your side while in a flat bed without using bedrails?: A Lot Help needed moving from lying on your back to sitting on the side of a flat bed without using bedrails?: A Lot Help needed moving to and from a bed to a chair (including a wheelchair)?: A Lot Help needed standing up from a chair using your arms (e.g., wheelchair or bedside chair)?: Total Help needed to walk in hospital room?: Total Help needed climbing 3-5 steps with a railing? : Total 6 Click Score: 9    End of Session Equipment Utilized During Treatment: Gait belt Activity Tolerance: Patient limited by fatigue Patient left: in bed;with call bell/phone within reach;with nursing/sitter in room Nurse Communication: Mobility status PT Visit Diagnosis: Muscle weakness (generalized) (M62.81);Other abnormalities of gait and mobility (R26.89)     Time: 1341-1355 PT Time Calculation (min) (ACUTE ONLY): 14 min  Charges:  $Therapeutic Activity: 8-22 mins                     Rica Koyanagi  PTA Acute   Rehabilitation Services Pager      [phone] Office      [phone]

## 2020-08-22 NOTE — Progress Notes (Signed)
Occupational Therapy Progress Note  Patient wanting to get OOB "get me out of here." Min G with HOB elevated and use of bed rail for bed mobility, requires two attempts to power up to standing with max cues and mod A and use of walker. Mod A to take few small steps to recliner, poor eccentric control into chair. Instruct patient in use of reacher + sock aid for LB dressing require min A with reacher and mod A with sock aid with decreased problem solving. Recommend continued acute OT to facilitate D/C to venue listed below.    08/22/20 0900  OT Visit Information  Last OT Received On 08/22/20  Assistance Needed +2  History of Present Illness 59 yo male admitted to [facility] on 9/24 with weakness, joint pain, difficulty urinating. Pt meeting SIRS criteria, possibly secondary to urinary infection. NGT placed 10/1 for ileus. PMHx significant for chronic gouty arthritis of multiple joints, CKD II, IDDM, HTN, HLD, and chronic anemia. Admitted to [facility] 8/11-8/24 for AKI, LUE DVT, and joint pain, d/c SNF.  Precautions  Precautions Fall  Precaution Comments NG tube  Pain Assessment  Pain Assessment Faces  Faces Pain Scale 0  Cognition  Arousal/Alertness Awake/alert  Behavior During Therapy Flat affect  Overall Cognitive Status No family/caregiver present to determine baseline cognitive functioning  Problem Solving Requires verbal cues;Requires tactile cues  General Comments requires assist problem solving with use of sock aid  ADL  Overall ADL's  Needs assistance/impaired  Lower Body Dressing Moderate assistance;With adaptive equipment;Cueing for sequencing;Sitting/lateral leans  Lower Body Dressing Details (indicate cue type and reason) instruct patient in use of reacher + sock aid for LB dressing, patient require increase time and min A for problem solving reacher to doff L sock, mod A to problem solve and utilize sock aid to don L sock. recommend further education to maximize independence  Toilet Transfer  Moderate assistance;Stand-pivot;Cueing for safety;Cueing for sequencing;BSC;RW  Toilet Transfer Details (indicate cue type and reason) to recliner  Functional mobility during ADLs Moderate assistance;Cueing for safety;Cueing for sequencing;Rolling walker  General ADL Comments session focus on functional transfer and ADL retraining with education in use of momentum to power up to standing from EOB and instruction on reacher + sock, decreased problem solving requiring tactile + verbal cues  Bed Mobility  Overal bed mobility Needs Assistance  Bed Mobility Supine to Sit  Supine to sit Min guard;HOB elevated  General bed mobility comments increased time and use of bed rail  Balance  Overall balance assessment Needs assistance  Sitting-balance support Feet supported  Sitting balance-Leahy Scale Fair  Standing balance support Bilateral upper extremity supported;During functional activity  Standing balance-Leahy Scale Poor  Standing balance comment mod A + B UE support  Restrictions  Weight Bearing Restrictions No  Transfers  Overall transfer level Needs assistance  Equipment used Rolling walker (2 wheeled)  Transfers Sit to/from Stand;Stand Pivot Transfers  Sit to Stand Mod assist  Stand pivot transfers Mod assist  General transfer comment requires x2 attempts to stand from EOB, max cues for hand placement and use of momentum. mod A to power up to standing and take few steps to recliner. poor eccentric control with no carry over to reach back for chair when sitting   General Comments  General comments (skin integrity, edema, etc.) VSS  OT - End of Session  Equipment Utilized During Treatment Rolling walker  Activity Tolerance Patient tolerated treatment well  Patient left in chair;with call bell/phone within reach;with chair alarm  set  Nurse Communication Mobility status  OT Assessment/Plan  OT Plan Discharge plan remains appropriate  OT Visit Diagnosis Muscle weakness (generalized)  (M62.81);Pain  Pain - Right/Left Left  Pain - part of body Hand  OT Frequency (ACUTE ONLY) Min 2X/week  Follow Up Recommendations SNF  OT Equipment None recommended by OT  AM-PAC OT "6 Clicks" Daily Activity Outcome Measure (Version 2)  Help from another person eating meals? 3  Help from another person taking care of personal grooming? 3  Help from another person toileting, which includes using toliet, bedpan, or urinal? 2  Help from another person bathing (including washing, rinsing, drying)? 2  Help from another person to put on and taking off regular upper body clothing? 2  Help from another person to put on and taking off regular lower body clothing? 1  6 Click Score 13  OT Goal Progression  Progress towards OT goals Progressing toward goals  Acute Rehab OT Goals  Patient Stated Goal get stronger  OT Goal Formulation With patient  Time For Goal Achievement 08/26/20  Potential to Achieve Goals Fair  OT Time Calculation  OT Start Time (ACUTE ONLY) 4982  OT Stop Time (ACUTE ONLY) 0853  OT Time Calculation (min) 25 min  OT General Charges  $OT Visit 1 Visit  OT Treatments  $Self Care/Home Management  23-37 mins   Delbert Phenix OT OT pager: [phone]

## 2020-08-22 NOTE — Progress Notes (Signed)
Peripherally Inserted Central Catheter Placement  The IV Nurse has discussed with the patient and/or persons authorized to consent for the patient, the purpose of this procedure and the potential benefits and risks involved with this procedure.  The benefits include less needle sticks, lab draws from the catheter, and the patient may be discharged home with the catheter. Risks include, but not limited to, infection, bleeding, blood clot (thrombus formation), and puncture of an artery; nerve damage and irregular heartbeat and possibility to perform a PICC exchange if needed/ordered by physician.  Alternatives to this procedure were also discussed.  Bard Power PICC patient education guide, fact sheet on infection prevention and patient information card has been provided to patient /or left at bedside.    PICC Placement Documentation  PICC Triple Lumen 08/22/20 PICC Right Brachial 38 cm 0 cm (Active)  Indication for Insertion or Continuance of Line Administration of hyperosmolar/irritating solutions (i.e. TPN, Vancomycin, etc.) 08/22/20 1305  Exposed Catheter (cm) 0 cm 08/22/20 1305  Site Assessment Clean;Dry;Intact 08/22/20 1305  Lumen #1 Status Blood return noted;Flushed;Saline locked 08/22/20 1305  Lumen #2 Status Blood return noted;Flushed;Saline locked 08/22/20 1305  Lumen #3 Status Blood return noted;Flushed;Saline locked 08/22/20 1305  Dressing Type Transparent;Securing device 08/22/20 1305  Dressing Status Clean;Dry;Intact 08/22/20 1305  Antimicrobial disc in place? Yes 08/22/20 1305  Safety Lock Not Applicable 34/74/25 9563  Dressing Change Due 08/29/20 08/22/20 1305       Frances Maywood 08/22/2020, 1:08 PM

## 2020-08-22 NOTE — Progress Notes (Signed)
PROGRESS NOTE    [name]  NOM:[id number] DOB: 1961/06/29 DOA: 08/10/2020 PCP: Charlott Rakes, MD    Chief Complaint  Patient presents with  . Leg Swelling  . Medical Clearance    rehab placement     Brief Narrative:  59 year old black male with known history of IDDM since 2012, HTN, chronic gout Prior scrotal abscess Rx 2017 drained at that time Severe onychomycosis Severe tricompartmental osteoarthritis both knees He is status post bilateral arthroscopies with meniscectomies by Dr. Berenice Primas in 2017 He has had work-up in the past found to be ANA negative RF negative CCP negative and felt to be a combination of his chronic gout and possible osteoarthritis He was recently hospitalized 8/11 through 8/24 with AKI found at that time to have left upper extremity DVT in addition to polyarticular gout He also came in with difficulty with urination found to have SIRS criteria with fever 102.8-CT chest no embolism no pneumonia urine analysis showed negative leukocytes negative nitrites However urine culture grew E. coli He has had intractable pain in upper extremities arms and swelling of his joints and he does not remember ever getting tapped on his joints but on review of chart as above it looks that he has been thoroughly worked up in the past for other rheumatological disease process and was supposed to follow-up with Dr. Estanislado Pandy rheumatology in the outpatient setting-it is unclear if this was ever done   Assessment & Plan:   Principal Problem:   SIRS (systemic inflammatory response syndrome) ([place]) Active Problems:   Essential hypertension   Gout   Iron deficiency anemia   Elevated troponin   Generalized weakness   Uncontrolled type 2 diabetes mellitus with hyperglycemia, without long-term current use of insulin ([place])   Acute deep vein thrombosis (DVT) of brachial vein of left upper extremity (HCC)   Ileus (HCC)   Hypokalemia   CKD (chronic kidney disease) stage 2, GFR  60-89 ml/min   Polyarticular gout   Nasogastric tube present   Acidosis, metabolic  1 colonic and small bowel 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  Patient stated had a large bowel movement on 08/15/2020, passing flatus.  Patient stated had a large bowel movement (08/18/2020) however per RN patient with mucus.  Patient also with some nausea and emesis on 08/18/2020. Per RN patient has not had any significant bowel movement since 08/15/2020. Patient stated ambulated in hallways on 08/15/2020 with therapy. Abdominal films done with stable diffuse colonic dilatation is noted most consistent with colonic ileus/adynamic ileus with progression and colonic pneumatosis..  CT abdomen and pelvis done 08/14/2020 with findings consistent with colonic ileus, pneumatosis of the ascending and hepatic flexure of the colon.  No associated colonic wall thickening or portal venous gas.  Normal small bowel without obstruction.  Hepatic steatosis.  Bilateral renal parenchymal atrophy with lobular contours.  Mild bladder wall thickening.  Patient with no significant change with abdominal distention.  Abdominal films with persistent gaseous distention of large and small bowel loops.  Patient seen by GI and NG tube ordered however unable to be placed per RN.  NG tube placed by GI under guidewire.  Per RN after NG tube placed patient with some relief of abdominal discomfort.  General surgery was following and initially recommended mobilization and GI decompression.  General surgery ordered neostigmine x 1 and per RN patient noted to have watery loose stools after neostigmine  was given.  Rectal tube has been ordered per GI.  Repeat abdominal films with unchanged gaseous distention of small bowel and colon compatible with ileus.  NG tube with output of 650 cc over the past 24 hours.  Keep  potassium > 4, magnesium > 2.  Currently n.p.o.  Supportive care.  PICC line to be placed today.  TPN to start today after PICC line placed.  GI surgery following.  2.  Polyarticular gout with osteoarthritis It is noted that patient had previous work-up with rheumatoid factor and ANA which were negative.  Steroids currently on hold.  Allopurinol discontinued on admission.  Colchicine on hold.  Likely needs SNF.  Will need outpatient follow-up with rheumatology as previously scheduled.  3.  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  Patient noted to have been on 70/30 insulin 50 units twice daily.  CBG 101 this morning.  Continue current dose of Lantus 70 units nightly, sliding scale insulin, IV Reglan.  Patient to be started on TPN today.   4.  Hypokalemia/hypomagnesemia Potassium at 3.7.  Magnesium at 1.8.  Phosphorus at 3.6.  Magnesium 4 g IV x1.  KCl 10 mEq IV every 1 hour x 4 runs.  Follow.  5.  Left upper extremity DVT Was on apixaban twice daily.  Patient currently n.p.o. due to colonic ileus.  IV heparin for anticoagulation.  Once on a diet consistently will resume apixaban.  Follow.   6.  Iron deficiency anemia Status post IV Feraheme 08/12/2020.  Once on the diet will need to resume oral iron supplementation oral discharge.  Hemoglobin at 8.3.  Likely dilutional effect as well.  Patient with no overt bleeding.  Follow H&H. Transfusion threshold hemoglobin < 7.  7.  Prior bilateral knee surgeries PT/OT.  SNF placement.  8.  Chronic kidney disease stage II Stable.  Follow.  9.  E. coli urinary tract infection Urine cultures > 100,000 colonies of E. coli.  Was on Keflex and subsequently transitioned to IV Ancef as patient noted to be NPO.  Status post full course of antibiotics.   10.  Metabolic acidosis Patient noted to be acidotic.  Concern for abdominal ischemia as patient with significant colonic ileus with no significant improvement.   Lactic acid level within normal limits.  Acidosis resolved with bicarb drip.  Bicarb drip has been discontinued.  Continue saline.  Follow.  11.  Nutrition Due to patient's colonic distention/ileus patient has been n.p.o. for several days.  Patient with NG tube and rectal tube.  Discussed with GI.  PICC line currently in the process of being placed.  Patient to be started on TPN per pharmacy today.   12.  Severe onchogryphosis  13.  QTC prolongation Per RN patient with QTC prolongation of 550.  Check a EKG.  Replete electrolytes.  Keep potassium > 4, magnesium > 2.   DVT prophylaxis: Heparin Code Status: Full Family Communication: Updated patient.  Disposition:   Status is: Inpatient    Dispo:  Patient From: Home  Planned Disposition: [place]  Expected discharge date: 08/25/20  Medically stable for discharge: No        Consultants:   Gastroenterology: Dr. Collene Mares 08/14/2020  General surgery: Dr. Hassell Done 08/14/2020  Procedures:   CT angiogram chest 08/10/2020  CT abdomen and pelvis 08/14/2020  2D echo 08/11/2020  Abdominal films 08/18/2020, 08/17/2020, 08/16/2020, 08/14/2020,   Antimicrobials:   Keflex 08/12/2020>>>> 08/14/2020  IV Ancef 08/15/2020>>>> 08/19/2020  IV Rocephin 08/14/2020>>> 08/15/2020  Subjective: Patient laying in bed about to get PICC line placed.  Denies any chest pain or shortness of breath.  States abdominal discomfort/pain improving.  Feels abdominal distention is improving.  Positive flatus.  States having bowel movement.   Objective: Vitals:   08/21/20 1259 08/21/20 2028 08/22/20 0509 08/22/20 0915  BP: (!) 138/97 (!) 135/94 127/90 (!) 144/95  Pulse: 73 82 88   Resp: 15 18 18    Temp: 98 F (36.7 C) 98.3 F (36.8 C) 97.9 F (36.6 C)   TempSrc: Oral Oral Oral   SpO2: 99% 98% 96%   Weight:   105.3 kg   Height:        Intake/Output Summary (Last 24 hours) at 08/22/2020 1254 Last data filed at 08/22/2020 0827 Gross per 24  hour  Intake 1294.89 ml  Output 3025 ml  Net -1730.11 ml   Filed Weights   08/20/20 0600 08/21/20 0500 08/22/20 0509  Weight: 98.7 kg 108.4 kg 105.3 kg    Examination:  General exam: NAD.  NG tube in place. Respiratory system: Lungs clear to auscultation bilaterally anterior lung fields.  No wheezes, no crackles, no rhonchi.  Normal respiratory effort.  Fair air movement.    Cardiovascular system: Regular rate rhythm no murmurs rubs or gallops.  No JVD.  Trace to 1+ bilateral lower extremity edema.  Gastrointestinal system: Abdomen, distended, less tight, hypoactive bowel sounds, nontender to palpation.  Central nervous system: Alert and oriented. No focal neurological deficits. Extremities: Symmetric 5 x 5 power. Skin: No rashes, lesions or ulcers Psychiatry: Judgement and insight appear normal. Mood & affect appropriate.     Data Reviewed: I have personally reviewed following labs and imaging studies  CBC: Recent Labs  Lab 08/18/20 0506 08/19/20 0622 08/20/20 0624 08/21/20 0535 08/22/20 0042  WBC 9.7 9.4 4.6 3.6* 4.8  NEUTROABS  --   --   --   --  3.2  HGB 9.7* 9.1* 8.1* 8.5* 8.3*  HCT 32.7* 30.2* 26.6* 28.2* 27.7*  MCV 80.7 80.1 79.9* 81.0 81.5  PLT 488* 425* 340 272 295    Basic Metabolic Panel: Recent Labs  Lab 08/17/20 0446 08/17/20 0446 08/18/20 0506 08/19/20 0622 08/20/20 0624 08/21/20 0535 08/22/20 0042  NA 139   < > 140 144 143 139 139  K 4.1   < > 4.6 3.6 2.9* 3.4* 3.7  CL 112*   < > 112* 109 101 103 102  CO2 18*   < > 16* 23 29 28 26   GLUCOSE 96   < > 126* 179* 131* 125* 120*  BUN 18   < > 17 13 10 7 7   CREATININE 0.84   < > 0.85 0.77 0.65 0.63 0.71  CALCIUM 8.5*   < > 8.6* 8.5* 8.1* 7.7* 7.9*  MG 1.8   < > 2.3 2.0 1.7 1.9 1.8  PHOS 2.8  --   --   --   --  2.4* 3.6   < > = values in this interval not displayed.    GFR: Estimated Creatinine Clearance: 109.7 mL/min (by C-G formula based on SCr of 0.71 mg/dL).  Liver Function Tests: Recent  Labs  Lab 08/16/20 0505 08/17/20 0446 08/20/20 0624 08/21/20 0535 08/22/20 0042  AST 22  --  26  --  27  ALT 39  --  20  --  22  ALKPHOS 90  --  76  --  76  BILITOT 0.5  --  0.7  --  0.8  PROT  6.4*  --  5.4*  --  5.4*  ALBUMIN 2.6* 2.8* 2.5* 2.3* 2.4*    CBG: Recent Labs  Lab 08/21/20 1609 08/21/20 2015 08/22/20 0026 08/22/20 0429 08/22/20 0820  GLUCAP 78 107* 116* 125* 101*     No results found for this or any previous visit (from the past 240 hour(s)).       Radiology Studies: DG Abd 1 View  Result Date: 08/22/2020 CLINICAL DATA:  Follow-up ileus. EXAM: ABDOMEN - 1 VIEW COMPARISON:  08/21/2020. FINDINGS: NG tube noted with its tip again noted over the duodenum. Distended loops of small and large bowel again noted. Similar findings on prior exam. Findings most consistent adynamic ileus. Follow-up exam suggested to demonstrate resolution and to exclude bowel obstruction. Degenerative change thoracolumbar spine. IMPRESSION: NG tube noted with its tip again noted over the duodenum. Distended loops of small and large bowel again noted. Similar findings on prior exam. Findings most consistent adynamic ileus. Follow-up exam suggested to demonstrate resolution and to exclude bowel obstruction. Electronically Signed   By: Marcello Moores  Register   On: 08/22/2020 08:30   DG Abd 1 View  Result Date: 08/21/2020 CLINICAL DATA:  Ileus EXAM: ABDOMEN - 1 VIEW COMPARISON:  08/20/2020 FINDINGS: NG tube tip appears transpyloric, in the second segment of the duodenum. Diffuse gaseous distention of small bowel and colon is similar to prior. IMPRESSION: NG tube tip is now transpyloric, in the descending duodenum. Similar diffuse gaseous distention of small bowel and colon. Electronically Signed   By: Misty Stanley M.D.   On: 08/21/2020 06:40   [place] EKG SITE RITE  Result Date: 08/21/2020 If Site Rite image not attached, placement could not be confirmed due to current cardiac  rhythm.       Scheduled Meds: . insulin aspart  0-9 Units Subcutaneous Q8H  . insulin glargine  7 Units Subcutaneous QHS  . metoCLOPramide (REGLAN) injection  5 mg Intravenous Q8H  . metoprolol tartrate  7.5 mg Intravenous Q6H   Continuous Infusions: . dextrose 5 % and 0.9 % NaCl with KCl 40 mEq/L 75 mL/hr at 08/22/20 0726  . dextrose 5 % and 0.9 % NaCl with KCl 40 mEq/L    . heparin 1,100 Units/hr (08/22/20 1022)  . potassium chloride    . TPN ADULT (ION)       LOS: 10 days    Time spent: 40 minutes    Irine Seal, MD [facility]   To contact the attending provider between 7A-7P or the covering provider during after hours 7P-7A, please log into the web site [email or web address] and access using universal [place] password for that web site. If you do not have the password, please call the hospital operator.  08/22/2020, 12:54 PM

## 2020-08-22 NOTE — Plan of Care (Signed)
  Problem: Consults Goal: RH ADOLESCENT PATIENT EDUCATION Description: Description: See Patient Education module for education specifics. Outcome: Progressing Goal: Skin Care Protocol Initiated - if Braden Score 18 or less Description: If consults are not indicated, leave blank or document N/A Outcome: Progressing Goal: Nutrition Consult-if indicated Outcome: Progressing Goal: Diabetes Guidelines if Diabetic/Glucose > 140 Description: If diabetic or lab glucose is > 140 mg/dl - Initiate Diabetes/Hyperglycemia Guidelines & Document Interventions  Outcome: Progressing   Problem: RH BOWEL ELIMINATION Goal: RH STG MANAGE BOWEL WITH ASSISTANCE Description: STG Manage Bowel with Assistance. Outcome: Progressing Goal: RH STG MANAGE BOWEL W/MEDICATION W/ASSISTANCE Description: STG Manage Bowel with Medication with Assistance. Outcome: Progressing   Problem: RH BLADDER ELIMINATION Goal: RH STG MANAGE BLADDER WITH ASSISTANCE Description: STG Manage Bladder With Assistance Outcome: Progressing Goal: RH STG MANAGE BLADDER WITH MEDICATION WITH ASSISTANCE Description: STG Manage Bladder With Medication With Assistance. Outcome: Progressing Goal: RH STG MANAGE BLADDER WITH EQUIPMENT WITH ASSISTANCE Description: STG Manage Bladder With Equipment With Assistance Outcome: Progressing   Problem: RH SKIN INTEGRITY Goal: RH STG SKIN FREE OF INFECTION/BREAKDOWN Outcome: Progressing Goal: RH STG MAINTAIN SKIN INTEGRITY WITH ASSISTANCE Description: STG Maintain Skin Integrity With Assistance. Outcome: Progressing Goal: RH STG ABLE TO PERFORM INCISION/WOUND CARE W/ASSISTANCE Description: STG Able To Perform Incision/Wound Care With Assistance. Outcome: Progressing   Problem: RH SAFETY Goal: RH STG ADHERE TO SAFETY PRECAUTIONS W/ASSISTANCE/DEVICE Description: STG Adhere to Safety Precautions With Assistance/Device. Outcome: Progressing Goal: RH STG DECREASED RISK OF FALL WITH  ASSISTANCE Description: STG Decreased Risk of Fall With Assistance. Outcome: Progressing   Problem: RH COGNITION-NURSING Goal: RH STG USES MEMORY AIDS/STRATEGIES W/ASSIST TO PROBLEM SOLVE Description: STG Uses Memory Aids/Strategies With Assistance to Problem Solve. Outcome: Progressing Goal: RH STG ANTICIPATES NEEDS/CALLS FOR ASSIST W/ASSIST/CUES Description: STG Anticipates Needs/Calls for Assist With Assistance/Cues. Outcome: Progressing   Problem: RH PAIN MANAGEMENT Goal: RH STG PAIN MANAGED AT OR BELOW PT'S PAIN GOAL Outcome: Progressing   Problem: RH KNOWLEDGE DEFICIT Goal: RH STG INCREASE KNOWLEDGE OF DIABETES Outcome: Progressing Goal: RH STG INCREASE KNOWLEDGE OF HYPERTENSION Outcome: Progressing Goal: RH STG INCREASE KNOWLEDGE OF DYSPHAGIA/FLUID INTAKE Outcome: Progressing Goal: RH STG INCREASE KNOWLEGDE OF HYPERLIPIDEMIA Outcome: Progressing Goal: RH STG INCREASE KNOWLEDGE OF STROKE PROPHYLAXIS Outcome: Progressing

## 2020-08-22 NOTE — Progress Notes (Signed)
ANTICOAGULATION CONSULT NOTE - Follow Up Consult  Pharmacy Consult for Heparin Indication: history of DVT  Allergies  Allergen Reactions  . Hctz [Hydrochlorothiazide]     Hx of gout; uric acid 14.1 on 03/13/20 Acute gout L elbow 8/11-8/24/21    Patient Measurements: Height: 5\' 5"  (165.1 cm) Weight: 105.3 kg (232 lb 2.3 oz) IBW/kg (Calculated) : 61.5 Heparin Dosing Weight: 85.7 kg  Vital Signs: Temp: 97.9 F (36.6 C) (10/05 1509) Temp Source: Oral (10/05 0915) BP: 167/98 (10/05 1534) Pulse Rate: 83 (10/05 0915)  Labs: Recent Labs    08/20/20 0624 08/20/20 4709 08/21/20 0535 08/21/20 0535 08/21/20 1726 08/22/20 0042 08/22/20 0810  HGB 8.1*   < > 8.5*  --   --  8.3*  --   HCT 26.6*  --  28.2*  --   --  27.7*  --   PLT 340  --  272  --   --  253  --   HEPARINUNFRC 0.33   < > 0.24*   < > 0.13* 0.28* 0.35  CREATININE 0.65  --  0.63  --   --  0.71  --    < > = values in this interval not displayed.    Estimated Creatinine Clearance: 109.7 mL/min (by C-G formula based on SCr of 0.71 mg/dL).   Medications:  Infusions:  . dextrose 5 % and 0.9 % NaCl with KCl 40 mEq/L 75 mL/hr at 08/22/20 0726  . dextrose 5 % and 0.9 % NaCl with KCl 40 mEq/L    . heparin 1,100 Units/hr (08/22/20 1022)  . TPN ADULT (ION)      Assessment: 13 y/oM with PMH of DVT (07/03/2020) on Apixaban PTA admitted on 08/10/2020 with SIRS. PO meds held d/t colonic ileus. Pharmacy consulted for IV heparin dosing while Apixaban on hold.   Confirmatory heparin level 0.4, remains therapeutic No bleeding or complications reported.   Goal of Therapy:  Heparin level 0.3-0.7 units/ml Monitor platelets by anticoagulation protocol: Yes   Plan:  Continue heparin IV infusion at 1100 units/hr Daily heparin level and CBC  Gretta Arab PharmD, BCPS Clinical Pharmacist [facility] main pharmacy [phone] 08/22/2020 4:11 PM

## 2020-08-22 NOTE — Progress Notes (Signed)
[place] for IV heparin  Indication: history of DVT  Allergies  Allergen Reactions  . Hctz [Hydrochlorothiazide]     Hx of gout; uric acid 14.1 on 03/13/20 Acute gout L elbow 8/11-8/24/21    Patient Measurements: Height: 5\' 5"  (165.1 cm) Weight: 105.3 kg (232 lb 2.3 oz) IBW/kg (Calculated) : 61.5 Heparin Dosing Weight: 85.7 kg  Vital Signs: Temp: 97.9 F (36.6 C) (10/05 0509) Temp Source: Oral (10/05 0509) BP: 127/90 (10/05 0509) Pulse Rate: 88 (10/05 0509)  Labs: Recent Labs    08/20/20 0624 08/20/20 0865 08/21/20 0535 08/21/20 0535 08/21/20 1726 08/22/20 0042 08/22/20 0810  HGB 8.1*   < > 8.5*  --   --  8.3*  --   HCT 26.6*  --  28.2*  --   --  27.7*  --   PLT 340  --  272  --   --  253  --   HEPARINUNFRC 0.33   < > 0.24*   < > 0.13* 0.28* 0.35  CREATININE 0.65  --  0.63  --   --  0.71  --    < > = values in this interval not displayed.    Estimated Creatinine Clearance: 109.7 mL/min (by C-G formula based on SCr of 0.71 mg/dL).   Assessment: 74 y/oM with PMH of DVT (07/03/2020) on Apixaban PTA admitted on 08/10/2020 with SIRS. Given worsening abdominal distention, CT abd pelvis done today, which revealed a colonic ileus with pneumatosis of the ascending and hepatic flexure of the colon. PO meds held. Pharmacy consulted for IV heparin dosing while Apixaban on hold. Last dose of Apixaban 5mg  PO given this morning at 0921. Note that recent Apixaban use may falsely elevate heparin levels.    Today, 08/22/2020:  HL 0.35, now therapeutic on 1100 units/hr   Hg low but stable; pltc wnl  No bleeding or infusion related concerns reported by RN  Still with unresolved ileus, unable to resume Eliquis  Goal of Therapy:  Heparin level 0.3-0.7 units/ml Monitor platelets by anticoagulation protocol: Yes   Plan:   Continue heparin infusion to 1100 units/hr  Obtain HL 6 hours to confirm therapeutic  Daily CBC and heparin level    Monitor closely for s/sx of bleeding, ability to resume Eliquis  Peggyann Juba, PharmD, BCPS Pharmacy: [phone] 08/22/2020, 8:53 AM

## 2020-08-22 NOTE — Progress Notes (Signed)
Subjective: No new complaints.  He had a good bowel movement this AM.  Objective: Vital signs in last 24 hours: Temp:  [97.9 F (36.6 C)-98.3 F (36.8 C)] 97.9 F (36.6 C) (10/05 1509) Pulse Rate:  [82-88] 83 (10/05 0915) Resp:  [18] 18 (10/05 0509) BP: (127-197)/(90-113) 167/98 (10/05 1534) SpO2:  [96 %-98 %] 96 % (10/05 0509) Weight:  [105.3 kg] 105.3 kg (10/05 0509) Last BM Date: 08/22/20  Intake/Output from previous day: 10/04 0701 - 10/05 0700 In: 1816.1 [I.V.:1184.9; NG/GT:30; IV Piggyback:601.2] Out: 3250 [Urine:2600; Emesis/NG output:650] Intake/Output this shift: Total I/O In: 100 [IV Piggyback:100] Out: 875 [Urine:875]  General appearance: alert and no distress GI: distended, soft, tympanic, + BS  Lab Results: Recent Labs    08/20/20 0624 08/21/20 0535 08/22/20 0042  WBC 4.6 3.6* 4.8  HGB 8.1* 8.5* 8.3*  HCT 26.6* 28.2* 27.7*  PLT 340 272 253   BMET Recent Labs    08/20/20 0624 08/21/20 0535 08/22/20 0042  NA 143 139 139  K 2.9* 3.4* 3.7  CL 101 103 102  CO2 29 28 26   GLUCOSE 131* 125* 120*  BUN 10 7 7   CREATININE 0.65 0.63 0.71  CALCIUM 8.1* 7.7* 7.9*   LFT Recent Labs    08/22/20 0042  PROT 5.4*  ALBUMIN 2.4*  AST 27  ALT 22  ALKPHOS 76  BILITOT 0.8   PT/INR No results for input(s): LABPROT, INR in the last 72 hours. Hepatitis Panel No results for input(s): HEPBSAG, HCVAB, HEPAIGM, HEPBIGM in the last 72 hours. C-Diff No results for input(s): CDIFFTOX in the last 72 hours. Fecal Lactopherrin No results for input(s): FECLLACTOFRN in the last 72 hours.  Studies/Results: DG Abd 1 View  Result Date: 08/22/2020 CLINICAL DATA:  Follow-up ileus. EXAM: ABDOMEN - 1 VIEW COMPARISON:  08/21/2020. FINDINGS: NG tube noted with its tip again noted over the duodenum. Distended loops of small and large bowel again noted. Similar findings on prior exam. Findings most consistent adynamic ileus. Follow-up exam suggested to demonstrate resolution  and to exclude bowel obstruction. Degenerative change thoracolumbar spine. IMPRESSION: NG tube noted with its tip again noted over the duodenum. Distended loops of small and large bowel again noted. Similar findings on prior exam. Findings most consistent adynamic ileus. Follow-up exam suggested to demonstrate resolution and to exclude bowel obstruction. Electronically Signed   By: Marcello Moores  Register   On: 08/22/2020 08:30   DG Abd 1 View  Result Date: 08/21/2020 CLINICAL DATA:  Ileus EXAM: ABDOMEN - 1 VIEW COMPARISON:  08/20/2020 FINDINGS: NG tube tip appears transpyloric, in the second segment of the duodenum. Diffuse gaseous distention of small bowel and colon is similar to prior. IMPRESSION: NG tube tip is now transpyloric, in the descending duodenum. Similar diffuse gaseous distention of small bowel and colon. Electronically Signed   By: Misty Stanley M.D.   On: 08/21/2020 06:40   [place] EKG SITE RITE  Result Date: 08/21/2020 If Site Rite image not attached, placement could not be confirmed due to current cardiac rhythm.   Medications:  Scheduled: . Chlorhexidine Gluconate Cloth  6 each Topical Daily  . insulin aspart  0-9 Units Subcutaneous Q8H  . insulin glargine  7 Units Subcutaneous QHS  . metoCLOPramide (REGLAN) injection  5 mg Intravenous Q8H  . metoprolol tartrate  7.5 mg Intravenous Q6H  . sodium chloride flush  10-40 mL Intracatheter Q12H   Continuous: . dextrose 5 % and 0.9 % NaCl with KCl 40 mEq/L 75 mL/hr at  08/22/20 0726  . dextrose 5 % and 0.9 % NaCl with KCl 40 mEq/L    . heparin 1,100 Units/hr (08/22/20 1022)  . TPN ADULT (ION)      Assessment/Plan: 1) Ileus. 2) Malnutrition.   I agree with the PICC line and to initiate TPN.  His ileus persists and there is no significant change compared to yesterday's examination.  His abdomen is soft, but still very distended and tympanic.  + BS.  Plan: 1) Continue with NG tube. 2) Correct any electrolyte abnormalities. 3) TPN.   LOS: 10 days   [name] D 08/22/2020, 3:44 PM

## 2020-08-22 NOTE — Progress Notes (Signed)
PHARMACY - TOTAL PARENTERAL NUTRITION CONSULT NOTE   Indication: Prolonged ileus  Patient Measurements: Height: 5\' 5"  (165.1 cm) Weight: 105.3 kg (232 lb 2.3 oz) IBW/kg (Calculated) : 61.5 TPN AdjBW (KG): 72.7 Body mass index is 38.63 kg/m. Usual Weight:   Assessment:  59 yo male with PMH gout, OA, DM2, anemia admitted with upper extremity DVT and development of colonic ileus. GI following, recommend NGT to LIWS, s/p neostigmine 10/1, no surgical indications at this point unless he perforates or has ischemia, have signed off 10/4.   Glucose / Insulin: Hx DM, A1c 7.18 Jun 2020. CBGs controlled < 150 on Lantus 7 units qHS Electrolytes: K wnl after IV replacement with KCl in IVF; Mg/Phos wnl after IV replacement - goal K >/= 4.0, Mg >/= 2.0 with ileus Renal: Hx CKD III. SCr WNL LFTs / TGs: LFTs WNL/TG 94 (10/5) Prealbumin / albumin: PAB 6.5/Alb 2.4 (10/5) Intake / Output; MIVF: I/O -1433 ml; 63ml NG output; D5NS KCl 28mEq/L at 75 ml/hr. Remains on IV reglan q8h GI Imaging: - 9/27 CT abd/pelvis: findings consistent with colonic ileus, pneumatosis of the ascending and hepatic flexure of the colon. - 10/3 AXR:  stable diffuse colonic dilatation is noted most consistent with colonic ileus/adynamic ileus with progression and colonic pneumatosis. Surgeries / Procedures:   Central access: plan PICC TPN start date: plan 10/5  Nutritional Goals (per RD recommendation pending): kCal: , Protein: , Fluid:  Goal TPN rate is  mL/hr (provides  g of protein and  kcals per day)  Current Nutrition:  NPO  Plan:  Magnesium sulfate 4g IV x 1 per MD Potassium chloride 10 mEq IV x 4 per MD  Start TPN at 40 mL/hr at 1800 Electrolytes in TPN: 79mEq/L of Na, 21mEq/L of K, 5mEq/L of Ca, 21mEq/L of Mg, and 82mmol/L of Phos. Cl:Ac ratio 1:1 Add standard MVI and trace elements to TPN Initiate Sensitive q8h SSI and adjust as needed  Reduce MIVF to 35 mL/hr at 1800 Monitor TPN labs on Mon/Thurs,  CMET/Mg/Phos in AM  Peggyann Juba, PharmD, BCPS Pharmacy: [phone] 08/22/2020,7:54 AM

## 2020-08-22 NOTE — Progress Notes (Signed)
[facility] for IV heparin  Indication: history of DVT  Allergies  Allergen Reactions  . Hctz [Hydrochlorothiazide]     Hx of gout; uric acid 14.1 on 03/13/20 Acute gout L elbow 8/11-8/24/21    Patient Measurements: Height: 5\' 5"  (165.1 cm) Weight: 108.4 kg (238 lb 15.7 oz) IBW/kg (Calculated) : 61.5 Heparin Dosing Weight: 85.7 kg  Vital Signs: Temp: 98.3 F (36.8 C) (10/04 2028) Temp Source: Oral (10/04 2028) BP: 135/94 (10/04 2028) Pulse Rate: 82 (10/04 2028)  Labs: Recent Labs    08/20/20 0624 08/20/20 0624 08/21/20 0535 08/21/20 1726 08/22/20 0042  HGB 8.1*   < > 8.5*  --  8.3*  HCT 26.6*  --  28.2*  --  27.7*  PLT 340  --  272  --  253  HEPARINUNFRC 0.33   < > 0.24* 0.13* 0.28*  CREATININE 0.65  --  0.63  --  0.71   < > = values in this interval not displayed.    Estimated Creatinine Clearance: 111.5 mL/min (by C-G formula based on SCr of 0.71 mg/dL).   Assessment: 41 y/oM with PMH of DVT (07/03/2020) on Apixaban PTA admitted on 08/10/2020 with SIRS. Given worsening abdominal distention, CT abd pelvis done today, which revealed a colonic ileus with pneumatosis of the ascending and hepatic flexure of the colon. PO meds held. Pharmacy consulted for IV heparin dosing while Apixaban on hold. Last dose of Apixaban 5mg  PO given this morning at 0921. Note that recent Apixaban use may falsely elevate heparin levels.   Baseline labs: Hg low at 8.2, pltc WNL, Heparin level >2.2, INR 1.5, aPTT 39 se, Scr 1.08  Today, 08/22/2020:  HL 0.28, subtherapeutic   Infusion paused ~10 min for level to be drawn from right hand- distal to infusion site   Hg low, down some; pltc wnl  No bleeding or infusion related concerns reported by RN  Still with unresolved ileus, unable to resume Eliquis  Goal of Therapy:  Heparin level 0.3-0.7 units/ml Monitor platelets by anticoagulation protocol: Yes   Plan:    increase heparin infusion to 1100  units/hr  Obtain HL 6 hours after rate change   Daily CBC and heparin level   Monitor closely for s/sx of bleeding, ability to resume Eliquis   Dolly Rias RPh 08/22/2020, 1:14 AM

## 2020-08-23 DIAGNOSIS — R6510 Systemic inflammatory response syndrome (SIRS) of non-infectious origin without acute organ dysfunction: Secondary | ICD-10-CM | POA: Diagnosis not present

## 2020-08-23 DIAGNOSIS — I129 Hypertensive chronic kidney disease with stage 1 through stage 4 chronic kidney disease, or unspecified chronic kidney disease: Secondary | ICD-10-CM

## 2020-08-23 LAB — COMPREHENSIVE METABOLIC PANEL
ALT: 20 U/L (ref 0–44)
AST: 22 U/L (ref 15–41)
Albumin: 2.4 g/dL — ABNORMAL LOW (ref 3.5–5.0)
Alkaline Phosphatase: 75 U/L (ref 38–126)
Anion gap: 8 (ref 5–15)
BUN: 8 mg/dL (ref 6–20)
CO2: 26 mmol/L (ref 22–32)
Calcium: 7.7 mg/dL — ABNORMAL LOW (ref 8.9–10.3)
Chloride: 102 mmol/L (ref 98–111)
Creatinine, Ser: 0.72 mg/dL (ref 0.61–1.24)
GFR calc non Af Amer: >60 mL/min (ref >60)
Glucose, Bld: 137 mg/dL — ABNORMAL HIGH (ref 70–99)
Potassium: 3.6 mmol/L (ref 3.5–5.1)
Sodium: 136 mmol/L (ref 135–145)
Total Bilirubin: 0.7 mg/dL (ref 0.3–1.2)
Total Protein: 5.2 g/dL — ABNORMAL LOW (ref 6.5–8.1)

## 2020-08-23 LAB — CBC
HCT: 26 % — ABNORMAL LOW (ref 39.0–52.0)
Hemoglobin: 8.3 g/dL — ABNORMAL LOW (ref 13.0–17.0)
MCH: 25.2 pg — ABNORMAL LOW (ref 26.0–34.0)
MCHC: 31.9 g/dL (ref 30.0–36.0)
MCV: 78.8 fL — ABNORMAL LOW (ref 80.0–100.0)
Platelets: 214 10*3/uL (ref 150–400)
RBC: 3.3 MIL/uL — ABNORMAL LOW (ref 4.22–5.81)
RDW: 22.6 % — ABNORMAL HIGH (ref 11.5–15.5)
WBC: 4.3 10*3/uL (ref 4.0–10.5)
nRBC: 0 % (ref 0.0–0.2)

## 2020-08-23 LAB — GLUCOSE, CAPILLARY
Glucose-Capillary: 129 mg/dL — ABNORMAL HIGH (ref 70–99)
Glucose-Capillary: 129 mg/dL — ABNORMAL HIGH (ref 70–99)
Glucose-Capillary: 137 mg/dL — ABNORMAL HIGH (ref 70–99)
Glucose-Capillary: 141 mg/dL — ABNORMAL HIGH (ref 70–99)
Glucose-Capillary: 142 mg/dL — ABNORMAL HIGH (ref 70–99)
Glucose-Capillary: 151 mg/dL — ABNORMAL HIGH (ref 70–99)

## 2020-08-23 LAB — PHOSPHORUS: Phosphorus: 2.8 mg/dL (ref 2.5–4.6)

## 2020-08-23 LAB — MAGNESIUM: Magnesium: 2.1 mg/dL (ref 1.7–2.4)

## 2020-08-23 LAB — HEPARIN LEVEL (UNFRACTIONATED): Heparin Unfractionated: 0.34 IU/mL (ref 0.30–0.70)

## 2020-08-23 MED ORDER — POTASSIUM CHLORIDE 10 MEQ/100ML IV SOLN
10.0000 meq | INTRAVENOUS | Status: AC
  Administered 2020-08-23 (×4): 10 meq via INTRAVENOUS
  Filled 2020-08-23 (×4): qty 100

## 2020-08-23 MED ORDER — TRAVASOL 10 % IV SOLN
INTRAVENOUS | Status: AC
  Filled 2020-08-23: qty 780

## 2020-08-23 MED ORDER — KCL IN DEXTROSE-NACL 40-5-0.9 MEQ/L-%-% IV SOLN
INTRAVENOUS | Status: DC
  Filled 2020-08-23 (×2): qty 1000

## 2020-08-23 NOTE — Progress Notes (Signed)
Physical Therapy Treatment Patient Details Name: [name] MRN: [id number] DOB: 1961/11/14 Today's Date: 08/23/2020    History of Present Illness 59 yo male admitted to [facility] on 9/24 with weakness, joint pain, difficulty urinating. Pt meeting SIRS criteria, possibly secondary to urinary infection. NGT placed 10/1 for ileus. PMHx significant for chronic gouty arthritis of multiple joints, CKD II, IDDM, HTN, HLD, and chronic anemia. Admitted to [facility] 8/11-8/24 for AKI, LUE DVT, and joint pain, d/c SNF.    PT Comments    General Comments: AxO x 3 more engaging, polite and very willing to try   Pt VERY weak.  General transfer comment: pt able to rise with Min Assist from recliner to partial upright to walker but fatigues quickly.  General Gait Details: very limited amb distance of 4 feet (3 times) with recliner CLOSELY following as pt sits uncontrolled due to weakness.  Pt amb 4 feet then sat and rested repeated 3 times.  MAX c/o fatigue, weakness "no energy".  HR increased from 78 to 130's.  Pt will need ST Rehab at SNF.   Follow Up Recommendations  SNF     Equipment Recommendations  None recommended by PT    Recommendations for Other Services       Precautions / Restrictions Precautions Precautions: Fall Precaution Comments: NG tube, NPO    Mobility  Bed Mobility               General bed mobility comments: OOB in recliner  Transfers Overall transfer level: Needs assistance Equipment used: Rolling walker (2 wheeled) Transfers: Sit to/from Stand Sit to Stand: Min assist         General transfer comment: pt able to rise with Min Assist from recliner to partial upright to walker but fatigues quickly  Ambulation/Gait Ambulation/Gait assistance: Mod assist;+2 safety/equipment Gait Distance (Feet): 12 Feet Assistive device: Rolling walker (2 wheeled) Gait Pattern/deviations: Step-to pattern;Decreased step length - left;Decreased step length - right Gait velocity:  decreased   General Gait Details: very limited amb distance of 4 feet (3 times) with recliner CLOSELY following as pt sits uncontrolled due to weakness.  Pt amb 4 feet then sat and rested repeated 3 times.  MAX c/o fatigue, weakness "no energy".  HR increased from 78 to 130's.   Stairs             Wheelchair Mobility    Modified Rankin (Stroke Patients Only)       Balance                                            Cognition Arousal/Alertness: Awake/alert Behavior During Therapy: WFL for tasks assessed/performed Overall Cognitive Status: Within Functional Limits for tasks assessed                                 General Comments: AxO x 3 more engaging, polite and very willing to try      Exercises      General Comments        Pertinent Vitals/Pain Pain Assessment: No/denies pain    Home Living                      Prior Function            PT Goals (current goals can  now be found in the care plan section) Progress towards PT goals: Progressing toward goals    Frequency    Min 2X/week      PT Plan Current plan remains appropriate    Co-evaluation              AM-PAC PT "6 Clicks" Mobility   Outcome Measure  Help needed turning from your back to your side while in a flat bed without using bedrails?: A Lot Help needed moving from lying on your back to sitting on the side of a flat bed without using bedrails?: A Lot Help needed moving to and from a bed to a chair (including a wheelchair)?: A Lot Help needed standing up from a chair using your arms (e.g., wheelchair or bedside chair)?: A Lot Help needed to walk in hospital room?: Total Help needed climbing 3-5 steps with a railing? : Total 6 Click Score: 10    End of Session Equipment Utilized During Treatment: Gait belt Activity Tolerance: Patient limited by fatigue Patient left: in chair;with chair alarm set Nurse Communication: Mobility status  (RN assisted "Thank You") PT Visit Diagnosis: Muscle weakness (generalized) (M62.81);Other abnormalities of gait and mobility (R26.89)     Time: 4320-0379 PT Time Calculation (min) (ACUTE ONLY): 28 min  Charges:  $Gait Training: 8-22 mins $Therapeutic Activity: 8-22 mins                     Rica Koyanagi  PTA Acute  Rehabilitation Services Pager      [phone] Office      [phone]

## 2020-08-23 NOTE — Progress Notes (Signed)
Subjective: No new complaints.  Objective: Vital signs in last 24 hours: Temp:  [97.9 F (36.6 C)-98.6 F (37 C)] 98.2 F (36.8 C) (10/06 0447) Pulse Rate:  [83-91] 91 (10/06 0447) Resp:  [17-18] 17 (10/06 0447) BP: (144-197)/(92-113) 158/92 (10/06 0447) SpO2:  [96 %-100 %] 97 % (10/06 0447) Last BM Date: 08/22/20  Intake/Output from previous day: 10/05 0701 - 10/06 0700 In: 2578.7 [I.V.:2078.7; IV Piggyback:500] Out: 2325 [Urine:1475; Emesis/NG output:850] Intake/Output this shift: No intake/output data recorded.  General appearance: alert and no distress Resp: clear to auscultation bilaterally Cardio: regular rate and rhythm GI: soft, distended, active bowel sounds Extremities: extremities normal, atraumatic, no cyanosis or edema  Lab Results: Recent Labs    08/21/20 0535 08/22/20 0042  WBC 3.6* 4.8  HGB 8.5* 8.3*  HCT 28.2* 27.7*  PLT 272 253   BMET Recent Labs    08/21/20 0535 08/22/20 0042  NA 139 139  K 3.4* 3.7  CL 103 102  CO2 28 26  GLUCOSE 125* 120*  BUN 7 7  CREATININE 0.63 0.71  CALCIUM 7.7* 7.9*   LFT Recent Labs    08/22/20 0042  PROT 5.4*  ALBUMIN 2.4*  AST 27  ALT 22  ALKPHOS 76  BILITOT 0.8   PT/INR No results for input(s): LABPROT, INR in the last 72 hours. Hepatitis Panel No results for input(s): HEPBSAG, HCVAB, HEPAIGM, HEPBIGM in the last 72 hours. C-Diff No results for input(s): CDIFFTOX in the last 72 hours. Fecal Lactopherrin No results for input(s): FECLLACTOFRN in the last 72 hours.  Studies/Results: DG Abd 1 View  Result Date: 08/22/2020 CLINICAL DATA:  Follow-up ileus. EXAM: ABDOMEN - 1 VIEW COMPARISON:  08/21/2020. FINDINGS: NG tube noted with its tip again noted over the duodenum. Distended loops of small and large bowel again noted. Similar findings on prior exam. Findings most consistent adynamic ileus. Follow-up exam suggested to demonstrate resolution and to exclude bowel obstruction. Degenerative change  thoracolumbar spine. IMPRESSION: NG tube noted with its tip again noted over the duodenum. Distended loops of small and large bowel again noted. Similar findings on prior exam. Findings most consistent adynamic ileus. Follow-up exam suggested to demonstrate resolution and to exclude bowel obstruction. Electronically Signed   By: Marcello Moores  Register   On: 08/22/2020 08:30   [place] EKG SITE RITE  Result Date: 08/21/2020 If Site Rite image not attached, placement could not be confirmed due to current cardiac rhythm.   Medications:  Scheduled: . Chlorhexidine Gluconate Cloth  6 each Topical Daily  . insulin aspart  0-9 Units Subcutaneous Q8H  . insulin glargine  7 Units Subcutaneous QHS  . metoCLOPramide (REGLAN) injection  5 mg Intravenous Q8H  . metoprolol tartrate  7.5 mg Intravenous Q6H  . sodium chloride flush  10-40 mL Intracatheter Q12H   Continuous: . dextrose 5 % and 0.9 % NaCl with KCl 40 mEq/L 35 mL/hr at 08/22/20 1952  . heparin 1,100 Units/hr (08/22/20 1022)  . TPN ADULT (ION) 40 mL/hr at 08/22/20 1724    Assessment/Plan: 1) Ileus. 2) Malnutrition.   The physical examination does show that he has more active bowel sounds.  He states that he is turning in bed every two hours.  Plan: 1) Okay to sit in chair and to ambulate.  Nursing orders were placed. 2) Continue with TPN.  LOS: 11 days   Maame Dack D 08/23/2020, 7:54 AM

## 2020-08-23 NOTE — Progress Notes (Signed)
Received consult for DBIV which includes heparin level. Unable to complete DBIV at this time due to heparin drip running through PICC line. Per RN, pt is restricted in left arm. Recommend discussing order for DBIV for heparin with pharmacist and/or MD if venipuncture is not an option for this pt.

## 2020-08-23 NOTE — Progress Notes (Signed)
Initial Nutrition Assessment  DOCUMENTATION CODES:   Obesity unspecified  INTERVENTION:   Monitor magnesium, potassium, and phosphorus daily for at least 3 days, MD to replete as needed, as pt is at risk for refeeding syndrome.  -TPN management per Pharmacy  NUTRITION DIAGNOSIS:   Inadequate oral intake related to  (prolonged ileus) as evidenced by NPO status.  GOAL:   Patient will meet greater than or equal to 90% of their needs  MONITOR:   Diet advancement, Labs, Weight trends, I & O's (TPN)  REASON FOR ASSESSMENT:   Consult New TPN/TNA  ASSESSMENT:   59 year old male with past medical history of polyarticular gout, insulin-dependent diabetes mellitus type 2, iron deficiency anemia, hypertension and recent diagnosis of left upper extremity DVT (06/2020) presenting with generalized malaise and weakness.  9/23: admitted 9/27: CT reveals colonic ileus 10/5: PICC placed, TPN initiated  TPN to advance to 65 ml/hr today, providing 1575 kcals and 78g protein. Pt has been without NPO since 9/27(9 days). Pt was only consuming 0-25% of meals when on a diet. Pt had poor appetite PTA with nausea and increased weakness. PICC was placed yesterday and TPN was initiated at 40 ml/hr. Pt will be at refeeding syndrome risk.  Admission weight: 234 lbs. Current weight: 232 lbs. Lowest weight this admission was 217 lbs on 10/3.  I/Os: +3.4L since admit UOP: 1475 ml x 24 hrs NGT: 750 ml  Medications: Reglan, D5 infusion, KCl,  Labs reviewed: CBGs: 129-151  NUTRITION - FOCUSED PHYSICAL EXAM:  No depletions.  Diet Order:   Diet Order            Diet NPO time specified Except for: Sips with Meds  Diet effective now                 EDUCATION NEEDS:   No education needs have been identified at this time  Skin:  Skin Assessment: Reviewed RN Assessment  Last BM:  10/5 -type 7  Height:   Ht Readings from Last 1 Encounters:  08/11/20 5\' 5"  (1.651 m)    Weight:   Wt  Readings from Last 1 Encounters:  08/22/20 105.3 kg    BMI:  Body mass index is 38.63 kg/m.  Estimated Nutritional Needs:   Kcal:  1850-2050  Protein:  95-110g  Fluid:  2L/day  Clayton Bibles, MS, RD, LDN Inpatient Clinical Dietitian Contact information available via Amion

## 2020-08-23 NOTE — Plan of Care (Signed)
  Problem: Consults Goal: RH ADOLESCENT PATIENT EDUCATION Description: Description: See Patient Education module for education specifics. Outcome: Progressing Goal: Skin Care Protocol Initiated - if Braden Score 18 or less Description: If consults are not indicated, leave blank or document N/A Outcome: Progressing Goal: Nutrition Consult-if indicated Outcome: Progressing Goal: Diabetes Guidelines if Diabetic/Glucose > 140 Description: If diabetic or lab glucose is > 140 mg/dl - Initiate Diabetes/Hyperglycemia Guidelines & Document Interventions  Outcome: Progressing   Problem: RH BOWEL ELIMINATION Goal: RH STG MANAGE BOWEL WITH ASSISTANCE Description: STG Manage Bowel with Assistance. Outcome: Progressing Goal: RH STG MANAGE BOWEL W/MEDICATION W/ASSISTANCE Description: STG Manage Bowel with Medication with Assistance. Outcome: Progressing   Problem: RH BLADDER ELIMINATION Goal: RH STG MANAGE BLADDER WITH ASSISTANCE Description: STG Manage Bladder With Assistance Outcome: Progressing Goal: RH STG MANAGE BLADDER WITH MEDICATION WITH ASSISTANCE Description: STG Manage Bladder With Medication With Assistance. Outcome: Progressing Goal: RH STG MANAGE BLADDER WITH EQUIPMENT WITH ASSISTANCE Description: STG Manage Bladder With Equipment With Assistance Outcome: Progressing   Problem: RH SKIN INTEGRITY Goal: RH STG SKIN FREE OF INFECTION/BREAKDOWN Outcome: Progressing Goal: RH STG MAINTAIN SKIN INTEGRITY WITH ASSISTANCE Description: STG Maintain Skin Integrity With Assistance. Outcome: Progressing Goal: RH STG ABLE TO PERFORM INCISION/WOUND CARE W/ASSISTANCE Description: STG Able To Perform Incision/Wound Care With Assistance. Outcome: Progressing   Problem: RH SAFETY Goal: RH STG ADHERE TO SAFETY PRECAUTIONS W/ASSISTANCE/DEVICE Description: STG Adhere to Safety Precautions With Assistance/Device. Outcome: Progressing Goal: RH STG DECREASED RISK OF FALL WITH  ASSISTANCE Description: STG Decreased Risk of Fall With Assistance. Outcome: Progressing   Problem: RH COGNITION-NURSING Goal: RH STG USES MEMORY AIDS/STRATEGIES W/ASSIST TO PROBLEM SOLVE Description: STG Uses Memory Aids/Strategies With Assistance to Problem Solve. Outcome: Progressing Goal: RH STG ANTICIPATES NEEDS/CALLS FOR ASSIST W/ASSIST/CUES Description: STG Anticipates Needs/Calls for Assist With Assistance/Cues. Outcome: Progressing   Problem: RH PAIN MANAGEMENT Goal: RH STG PAIN MANAGED AT OR BELOW PT'S PAIN GOAL Outcome: Progressing   Problem: RH KNOWLEDGE DEFICIT Goal: RH STG INCREASE KNOWLEDGE OF DIABETES Outcome: Progressing Goal: RH STG INCREASE KNOWLEDGE OF HYPERTENSION Outcome: Progressing Goal: RH STG INCREASE KNOWLEDGE OF DYSPHAGIA/FLUID INTAKE Outcome: Progressing Goal: RH STG INCREASE KNOWLEGDE OF HYPERLIPIDEMIA Outcome: Progressing Goal: RH STG INCREASE KNOWLEDGE OF STROKE PROPHYLAXIS Outcome: Progressing

## 2020-08-23 NOTE — Progress Notes (Signed)
PHARMACY - TOTAL PARENTERAL NUTRITION CONSULT NOTE   Indication: Prolonged ileus  Patient Measurements: Height: 5\' 5"  (165.1 cm) Weight: 105.3 kg (232 lb 2.3 oz) IBW/kg (Calculated) : 61.5 TPN AdjBW (KG): 72.7 Body mass index is 38.63 kg/m. Usual Weight:   Assessment:  59 yo male with PMH gout, OA, DM2, anemia admitted with upper extremity DVT and development of colonic ileus. GI following, recommend NGT to LIWS, s/p neostigmine 10/1, no surgical indications at this point unless he perforates or has ischemia, have signed off 10/4.   Glucose / Insulin: Hx DM, A1c 7.18 Jun 2020. CBGs controlled < 150 on Lantus 7 units qHS Electrolytes: K 3.6 - will give KCL IV runs - goal K >/= 4.0, Mg >/= 2.0 with ileus Renal: Hx CKD III. SCr WNL LFTs / TGs: LFTs WNL/TG 94 (10/5) Prealbumin / albumin: PAB 6.5/Alb 2.4 (10/5) Intake / Output; MIVF: I/O +253 ml; NG 850 mL output; D5NS KCl 76mEq/L at 75 ml/hr. Remains on IV reglan q8h GI Imaging: - 9/27 CT abd/pelvis: findings consistent with colonic ileus, pneumatosis of the ascending and hepatic flexure of the colon. - 10/3 AXR:  stable diffuse colonic dilatation is noted most consistent with colonic ileus/adynamic ileus with progression and colonic pneumatosis. Surgeries / Procedures:   Central access: plan PICC TPN start date: plan 10/5  Nutritional Goals (per RD recommendation pending): kCal: , Protein: , Fluid:   Goal TPN rate is 90 mL/hr (provides 108 g of protein and 2181 kcals per day)  Current Nutrition:  NPO  Plan:  Potassium chloride 10 mEq IV x 4 per MD  Start TPN at 65 mL/hr at 1800 Electrolytes in TPN: 69mEq/L of Na, 42mEq/L of K, 106mEq/L of Ca, 47mEq/L of Mg, and 68mmol/L of Phos. Cl:Ac ratio 1:1 Add standard MVI and trace elements to TPN Initiate Sensitive q8h SSI and adjust as needed  Reduce MIVF to KVO at 1800 Monitor TPN labs on Mon/Thurs, CMET/Mg/Phos in AM   Adrian Saran, PharmD, BCPS [phone] until 3pm 08/23/2020  9:39 AM

## 2020-08-23 NOTE — Progress Notes (Signed)
Had discussion with lab - patient needs labs drawn including heparin level and TPN labs, but heparin running thru PICC line and pt with restrictions for blood draws from left arm and now right arm. Informed lab that ok to stop heparin drip temporarily, flush line, wait at least a minute then draw necessary labs. After done, can restart IV heparin as previously ordered  Adrian Saran, PharmD, BCPS [phone] until 3pm 08/23/2020 7:33 AM

## 2020-08-23 NOTE — Progress Notes (Signed)
ANTICOAGULATION CONSULT NOTE - Follow Up Consult  Pharmacy Consult for Heparin (on apixaban PTA) Indication: history of DVT  Allergies  Allergen Reactions  . Hctz [Hydrochlorothiazide]     Hx of gout; uric acid 14.1 on 03/13/20 Acute gout L elbow 8/11-8/24/21    Patient Measurements: Height: 5\' 5"  (165.1 cm) Weight: 105.3 kg (232 lb 2.3 oz) IBW/kg (Calculated) : 61.5 Heparin Dosing Weight: 85.7 kg  Vital Signs: Temp: 98.2 F (36.8 C) (10/06 0447) Temp Source: Oral (10/06 0447) BP: 158/92 (10/06 0447) Pulse Rate: 91 (10/06 0447)  Labs: Recent Labs    08/21/20 0535 08/21/20 1726 08/22/20 0042 08/22/20 0042 08/22/20 0810 08/22/20 1559 08/23/20 0755  HGB 8.5*  --  8.3*  --   --   --  8.3*  HCT 28.2*  --  27.7*  --   --   --  26.0*  PLT 272  --  253  --   --   --  214  HEPARINUNFRC 0.24*   < > 0.28*   < > 0.35 0.40 0.34  CREATININE 0.63  --  0.71  --   --   --  0.72   < > = values in this interval not displayed.    Estimated Creatinine Clearance: 109.7 mL/min (by C-G formula based on SCr of 0.72 mg/dL).   Medications:  Infusions:  . dextrose 5 % and 0.9 % NaCl with KCl 40 mEq/L 35 mL/hr at 08/22/20 1952  . heparin 1,100 Units/hr (08/23/20 0843)  . TPN ADULT (ION) 40 mL/hr at 08/22/20 1724    Assessment: 45 y/oM with PMH of DVT (07/03/2020) on Apixaban PTA admitted on 08/10/2020 with SIRS. PO meds held d/t colonic ileus. Pharmacy consulted for IV heparin dosing while Apixaban on hold.    Heparin level remains therapeutic on current IV heparin rate of 1100 units/hr  CBC stable  No bleeding or complications reported.   Goal of Therapy:  Heparin level 0.3-0.7 units/ml Monitor platelets by anticoagulation protocol: Yes   Plan:  Continue heparin IV infusion at 1100 units/hr Daily heparin level and CBC   Adrian Saran, PharmD, BCPS [phone] until 3pm 08/23/2020 9:29 AM

## 2020-08-23 NOTE — Progress Notes (Addendum)
PROGRESS NOTE    [name]  GMW:[id number] DOB: 1961/03/06 DOA: 08/10/2020 PCP: Charlott Rakes, MD     Brief Narrative:  [name] is a 59 yo male with past medical history significant of type 2 diabetes, chronic gout who was recently hospitalized 8/11-8/24 with AKI, at that time found to have left upper extremity DVT in addition to polyarticular gout.  He now comes in with sepsis, UTI, bowel obstruction.  New events last 24 hours / Subjective: He is sitting in chair, states that he has had bowel movement.  Denies any nausea or vomiting.  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  Currently on IV heparin due to n.p.o. status  CKD stage II -Stable  Sepsis secondary to E. coli UTI, present on admission -Sepsis ruled in -Completed antibiotics  Iron deficiency anemia -Status post IV Feraheme 9/25  Hypokalemia -Replace, trend     DVT prophylaxis: IV heparin   Code Status: Full code Family Communication: No family at  bedside Disposition Plan:  Status is: Inpatient  Remains inpatient appropriate because:IV treatments appropriate due to intensity of illness or inability to take PO   Dispo:  Patient From: Home  Planned Disposition: [name]  Expected discharge date: 08/28/20  Medically stable for discharge: No .  He remains on TPN and NG tube   Antimicrobials:  Anti-infectives (From admission, onward)   Start     Dose/Rate Route Frequency Ordered Stop   08/15/20 1600  ceFAZolin (ANCEF) IVPB 1 g/50 mL premix        1 g 100 mL/hr over 30 Minutes Intravenous Every 8 hours 08/15/20 1429 08/19/20 2243   08/14/20 1730  cefTRIAXone (ROCEPHIN) 1 g in sodium chloride 0.9 % 100 mL IVPB  Status:  Discontinued        1 g 200 mL/hr over 30 Minutes Intravenous Every 24 hours 08/14/20 1647 08/15/20 1429   08/12/20 1230  cephALEXin (KEFLEX) capsule 500 mg  Status:  Discontinued        500 mg Oral Every 12 hours 08/12/20 1139 08/14/20 1647        Objective: Vitals:   08/22/20 1534 08/22/20 2026 08/23/20 0447 08/23/20 1257  BP: (!) 167/98 (!) 160/92 (!) 158/92 (!) 167/100  Pulse:  85 91 89  Resp:  18 17 17   Temp:  98.6 F (37 C) 98.2 F (36.8 C) 97.9 F (36.6 C)  TempSrc:  Oral Oral Oral  SpO2:  96% 97% 100%  Weight:      Height:        Intake/Output Summary (Last 24 hours) at 08/23/2020 1420 Last  data filed at 08/23/2020 1300 Gross per 24 hour  Intake 1726.2 ml  Output 1350 ml  Net 376.2 ml   Filed Weights   08/20/20 0600 08/21/20 0500 08/22/20 0509  Weight: 98.7 kg 108.4 kg 105.3 kg    Examination:  General exam: Appears calm and comfortable  Respiratory system: Clear to auscultation. Respiratory effort normal. No respiratory distress. No conversational dyspnea.  Cardiovascular system: S1 & S2 heard, RRR. No murmurs. No pedal edema. Gastrointestinal system: Abdomen is distended but soft, nontender to palpation, positive bowel sounds, NG tube in place Central nervous  system: Alert and oriented. No focal neurological deficits. Speech clear.  Extremities: Symmetric in appearance  Skin: No rashes, lesions or ulcers on exposed skin  Psychiatry: Judgement and insight appear normal. Mood & affect appropriate.   Data Reviewed: I have personally reviewed following labs and imaging studies  CBC: Recent Labs  Lab 08/19/20 0622 08/20/20 0624 08/21/20 0535 08/22/20 0042 08/23/20 0755  WBC 9.4 4.6 3.6* 4.8 4.3  NEUTROABS  --   --   --  3.2  --   HGB 9.1* 8.1* 8.5* 8.3* 8.3*  HCT 30.2* 26.6* 28.2* 27.7* 26.0*  MCV 80.1 79.9* 81.0 81.5 78.8*  PLT 425* 340 272 253 161   Basic Metabolic Panel: Recent Labs  Lab 08/17/20 0446 08/18/20 0506 08/19/20 0622 08/20/20 0624 08/21/20 0535 08/22/20 0042 08/23/20 0755  NA 139   < > 144 143 139 139 136  K 4.1   < > 3.6 2.9* 3.4* 3.7 3.6  CL 112*   < > 109 101 103 102 102  CO2 18*   < > 23 29 28 26 26   GLUCOSE 96   < > 179* 131* 125* 120* 137*  BUN 18   < > 13 10 7 7 8   CREATININE 0.84   < > 0.77 0.65 0.63 0.71 0.72  CALCIUM 8.5*   < > 8.5* 8.1* 7.7* 7.9* 7.7*  MG 1.8   < > 2.0 1.7 1.9 1.8 2.1  PHOS 2.8  --   --   --  2.4* 3.6 2.8   < > = values in this interval not displayed.   GFR: Estimated Creatinine Clearance: 109.7 mL/min (by C-G formula based on SCr of 0.72 mg/dL). Liver Function Tests: Recent Labs  Lab 08/17/20 0446 08/20/20 0624 08/21/20 0535 08/22/20 0042 08/23/20 0755  AST  --  26  --  27 22  ALT  --  20  --  22 20  ALKPHOS  --  76  --  76 75  BILITOT  --  0.7  --  0.8 0.7  PROT  --  5.4*  --  5.4* 5.2*  ALBUMIN 2.8* 2.5* 2.3* 2.4* 2.4*   No results for input(s): LIPASE, AMYLASE in the last 168 hours. No results for input(s): AMMONIA in the last 168 hours. Coagulation Profile: No results for input(s): INR, PROTIME in the last 168 hours. Cardiac Enzymes: No results for input(s): CKTOTAL, CKMB, CKMBINDEX, TROPONINI in the last 168 hours. BNP (last 3 results) No results for input(s):  PROBNP in the last 8760 hours. HbA1C: No results for input(s): HGBA1C in the last 72 hours. CBG: Recent Labs  Lab 08/22/20 2024 08/23/20 0014 08/23/20 0444 08/23/20 0750 08/23/20 1106  GLUCAP 155* 137* 151* 129* 129*   Lipid Profile: Recent Labs    08/22/20 0042  TRIG 94   Thyroid Function Tests: No results for input(s): TSH, T4TOTAL, FREET4, T3FREE, THYROIDAB in the last 72 hours.  Anemia Panel: No results for input(s): VITAMINB12, FOLATE, FERRITIN, TIBC, IRON, RETICCTPCT in the last 72 hours. Sepsis Labs: Recent Labs  Lab 08/18/20 0942  LATICACIDVEN 1.3    No results found for this or any previous visit (from the past 240 hour(s)).    Radiology Studies: DG Abd 1 View  Result Date: 08/22/2020 CLINICAL DATA:  Follow-up ileus. EXAM: ABDOMEN - 1 VIEW COMPARISON:  08/21/2020. FINDINGS: NG tube noted with its tip again noted over the duodenum. Distended loops of small and large bowel again noted. Similar findings on prior exam. Findings most consistent adynamic ileus. Follow-up exam suggested to demonstrate resolution and to exclude bowel obstruction. Degenerative change thoracolumbar spine. IMPRESSION: NG tube noted with its tip again noted over the duodenum. Distended loops of small and large bowel again noted. Similar findings on prior exam. Findings most consistent adynamic ileus. Follow-up exam suggested to demonstrate resolution and to exclude bowel obstruction. Electronically Signed   By: Marcello Moores  Register   On: 08/22/2020 08:30   [place] EKG SITE RITE  Result Date: 08/21/2020 If Site Rite image not attached, placement could not be confirmed due to current cardiac rhythm.     Scheduled Meds: . Chlorhexidine Gluconate Cloth  6 each Topical Daily  . insulin aspart  0-9 Units Subcutaneous Q8H  . insulin glargine  7 Units Subcutaneous QHS  . metoCLOPramide (REGLAN) injection  5 mg Intravenous Q8H  . metoprolol tartrate  7.5 mg Intravenous Q6H  . sodium chloride flush  10-40  mL Intracatheter Q12H   Continuous Infusions: . dextrose 5 % and 0.9 % NaCl with KCl 40 mEq/L 35 mL/hr at 08/22/20 1952  . dextrose 5 % and 0.9 % NaCl with KCl 40 mEq/L    . heparin 1,100 Units/hr (08/23/20 0843)  . potassium chloride 10 mEq (08/23/20 1408)  . TPN ADULT (ION) 40 mL/hr at 08/22/20 1724  . TPN ADULT (ION)       LOS: 11 days      Time spent: 40 minutes   Dessa Phi, DO [facility] 08/23/2020, 2:20 PM   Available via Epic secure chat 7am-7pm After these hours, please refer to coverage provider listed on [email or web address]

## 2020-08-23 NOTE — Progress Notes (Signed)
Pt currently has heparin drip running through PICC line. Notified phlebotomist pt will need venipuncture for lab work this morning.

## 2020-08-24 ENCOUNTER — Inpatient Hospital Stay: Payer: Medicare HMO | Admitting: Physician Assistant

## 2020-08-24 DIAGNOSIS — R6510 Systemic inflammatory response syndrome (SIRS) of non-infectious origin without acute organ dysfunction: Secondary | ICD-10-CM | POA: Diagnosis not present

## 2020-08-24 LAB — COMPREHENSIVE METABOLIC PANEL
ALT: 21 U/L (ref 0–44)
AST: 22 U/L (ref 15–41)
Albumin: 2.5 g/dL — ABNORMAL LOW (ref 3.5–5.0)
Alkaline Phosphatase: 74 U/L (ref 38–126)
Anion gap: 7 (ref 5–15)
BUN: 8 mg/dL (ref 6–20)
CO2: 25 mmol/L (ref 22–32)
Calcium: 8.2 mg/dL — ABNORMAL LOW (ref 8.9–10.3)
Chloride: 104 mmol/L (ref 98–111)
Creatinine, Ser: 0.69 mg/dL (ref 0.61–1.24)
GFR calc non Af Amer: >60 mL/min (ref >60)
Glucose, Bld: 169 mg/dL — ABNORMAL HIGH (ref 70–99)
Potassium: 4 mmol/L (ref 3.5–5.1)
Sodium: 136 mmol/L (ref 135–145)
Total Bilirubin: 0.6 mg/dL (ref 0.3–1.2)
Total Protein: 5.6 g/dL — ABNORMAL LOW (ref 6.5–8.1)

## 2020-08-24 LAB — GLUCOSE, CAPILLARY
Glucose-Capillary: 162 mg/dL — ABNORMAL HIGH (ref 70–99)
Glucose-Capillary: 139 mg/dL — ABNORMAL HIGH (ref 70–99)
Glucose-Capillary: 163 mg/dL — ABNORMAL HIGH (ref 70–99)
Glucose-Capillary: 169 mg/dL — ABNORMAL HIGH (ref 70–99)

## 2020-08-24 LAB — CBC
HCT: 27.4 % — ABNORMAL LOW (ref 39.0–52.0)
Hemoglobin: 8.4 g/dL — ABNORMAL LOW (ref 13.0–17.0)
MCH: 24.2 pg — ABNORMAL LOW (ref 26.0–34.0)
MCHC: 30.7 g/dL (ref 30.0–36.0)
MCV: 79 fL — ABNORMAL LOW (ref 80.0–100.0)
Platelets: 200 10*3/uL (ref 150–400)
RBC: 3.47 MIL/uL — ABNORMAL LOW (ref 4.22–5.81)
RDW: 22.9 % — ABNORMAL HIGH (ref 11.5–15.5)
WBC: 4.2 10*3/uL (ref 4.0–10.5)
nRBC: 0 % (ref 0.0–0.2)

## 2020-08-24 LAB — MAGNESIUM: Magnesium: 1.7 mg/dL (ref 1.7–2.4)

## 2020-08-24 LAB — HEPARIN LEVEL (UNFRACTIONATED): Heparin Unfractionated: 0.3 IU/mL (ref 0.30–0.70)

## 2020-08-24 LAB — PHOSPHORUS: Phosphorus: 2.9 mg/dL (ref 2.5–4.6)

## 2020-08-24 MED ORDER — MAGNESIUM SULFATE 2 GM/50ML IV SOLN
2.0000 g | Freq: Once | INTRAVENOUS | Status: AC
  Administered 2020-08-24: 2 g via INTRAVENOUS
  Filled 2020-08-24: qty 50

## 2020-08-24 MED ORDER — TRAVASOL 10 % IV SOLN
INTRAVENOUS | Status: AC
  Filled 2020-08-24: qty 1080

## 2020-08-24 NOTE — Progress Notes (Signed)
PHARMACY - TOTAL PARENTERAL NUTRITION CONSULT NOTE   Indication: Prolonged ileus  Patient Measurements: Height: 5\' 5"  (165.1 cm) Weight: 105.3 kg (232 lb 2.3 oz) IBW/kg (Calculated) : 61.5 TPN AdjBW (KG): 72.7 Body mass index is 38.63 kg/m. Usual Weight:   Assessment:  59 yo male with PMH gout, OA, DM2, anemia admitted with upper extremity DVT and development of colonic ileus. GI following, recommend NGT to LIWS, s/p neostigmine 10/1, no surgical indications at this point unless he perforates or has ischemia, have signed off 10/4.   Glucose / Insulin: Hx DM, A1c 7.18 Jun 2020. CBGs controlled < 150 on Lantus 7 units qHS - 4 units of insulin given yesterday  Electrolytes: K 4.0 today after additional KCL IV runs yesterday, magnesium 1.7, others WNL, CorrCa WNL   - goal K >/= 4.0, Mg >/= 2.0 with ileus Renal: Hx CKD III. SCr WNL LFTs / TGs: LFTs WNL/TG 94 (10/5) Prealbumin / albumin: PAB 6.5/Alb 2.4 (10/5) Intake / Output; MIVF: I/O -1513 ml; NG 1050 mL output; D5NS KCl 59mEq/L at [place] on IV reglan q8h GI Imaging: - 9/27 CT abd/pelvis: findings consistent with colonic ileus, pneumatosis of the ascending and hepatic flexure of the colon. - 10/3 AXR:  stable diffuse colonic dilatation is noted most consistent with colonic ileus/adynamic ileus with progression and colonic pneumatosis. Surgeries / Procedures:   Central access: plan PICC TPN start date: plan 10/5  Nutritional Goals (per RD recommendation on 10/6):  Kcal:  1850-2050  Protein:  95-110g  Fluid:  2L/day  Goal TPN rate is 90 mL/hr (provides 108 g of protein and 2181 kcals per day)  Current Nutrition:  NPO  Plan:  Magnesium sulfate 2 gr IV x 1    Increase TPN to 90  mL/hr at 1800, target rate   Electrolytes in TPN: 7mEq/L of Na, 66mEq/L of K, 63mEq/L of Ca, 20mEq/L of Mg, and 51mmol/L of Phos. Cl:Ac ratio 1:1  Standard MVI and trace elements to TPN  Continue  Sensitive q8h SSI and adjust as needed    Continue MIVF to KVO at 1800  Monitor TPN labs on Mon/Thurs,   BMET/Mg/Phos with AM labs   Royetta Asal, PharmD, BCPS 08/24/2020 11:36 AM

## 2020-08-24 NOTE — Progress Notes (Signed)
PROGRESS NOTE    [name]  OEU:[id number] DOB: December 28, 1960 DOA: 08/10/2020 PCP: Charlott Rakes, MD     Brief Narrative:  [name] is a 59 yo male with past medical history significant of type 2 diabetes, chronic gout who was recently hospitalized 8/11-8/24 with AKI, at that time found to have left upper extremity DVT in addition to polyarticular gout.  He now comes in with sepsis, UTI, bowel obstruction.  New events last 24 hours / Subjective: Had a BM this morning. NGT is clamped, patient is tolerating clear liquid diet.   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  Currently on IV heparin due to above   CKD stage II -Stable  Sepsis secondary to E. coli UTI, present on admission -Sepsis ruled in -Completed antibiotics  Iron deficiency anemia -Status post IV Feraheme 9/25    DVT prophylaxis: IV heparin   Code Status: Full code Family Communication: No family at  bedside Disposition Plan:  Status is: Inpatient  Remains inpatient appropriate because:IV treatments appropriate due to intensity of illness or inability to take PO   Dispo:  Patient From: Home  Planned Disposition: [place]  Expected discharge date: 08/28/20  Medically stable for discharge: No .  He remains on TPN and NG tube.  Advance to clear liquids today.   Antimicrobials:  Anti-infectives (From admission, onward)   Start     Dose/Rate Route Frequency Ordered Stop   08/15/20 1600  ceFAZolin (ANCEF) IVPB 1 g/50 mL premix        1 g 100 mL/hr over 30 Minutes Intravenous Every 8 hours 08/15/20 1429 08/19/20 2243   08/14/20 1730  cefTRIAXone (ROCEPHIN) 1 g in sodium chloride 0.9 % 100 mL IVPB  Status:  Discontinued        1 g 200 mL/hr over 30 Minutes Intravenous Every 24 hours 08/14/20 1647 08/15/20 1429   08/12/20 1230  cephALEXin (KEFLEX) capsule 500 mg  Status:  Discontinued        500 mg Oral Every 12 hours 08/12/20 1139 08/14/20 1647       Objective: Vitals:   08/23/20 0447 08/23/20 1257 08/23/20 2024 08/24/20 1104  BP: (!) 158/92 (!) 167/100 (!) 147/86 (!) 168/109  Pulse: 91 89  90  Resp: 17 17  16   Temp: 98.2 F (36.8 C) 97.9 F (36.6 C) 98.6 F (37 C) 97.9 F (36.6 C)  TempSrc: Oral Oral Oral   SpO2: 97% 100%  100%  Weight:      Height:  Intake/Output Summary (Last 24 hours) at 08/24/2020 1142 Last data filed at 08/24/2020 0900 Gross per 24 hour  Intake 2311.67 ml  Output 4325 ml  Net -2013.33 ml   Filed Weights   08/20/20 0600 08/21/20 0500 08/22/20 0509  Weight: 98.7 kg 108.4 kg 105.3 kg    Examination: General exam: Appears calm and comfortable  Respiratory system: Clear to auscultation. Respiratory effort normal. Cardiovascular system: S1 & S2 heard, RRR. No pedal edema. Gastrointestinal system: Abdomen is distended but soft, bowel sounds present Central nervous system: Alert and oriented. Non focal exam. Speech clear   Extremities: Symmetric in appearance bilaterally  Skin: No rashes, lesions or ulcers on exposed skin  Psychiatry: Judgement and insight appear stable. Mood & affect appropriate.    Data Reviewed: I have personally reviewed following labs and imaging studies  CBC: Recent Labs  Lab 08/20/20 0624 08/21/20 0535 08/22/20 0042 08/23/20 0755 08/24/20 0411  WBC 4.6 3.6* 4.8 4.3 4.2  NEUTROABS  --   --  3.2  --   --   HGB 8.1* 8.5* 8.3* 8.3* 8.4*  HCT 26.6* 28.2* 27.7* 26.0* 27.4*  MCV 79.9* 81.0 81.5 78.8* 79.0*  PLT 340 272 253 214 786   Basic Metabolic Panel: Recent Labs  Lab 08/20/20 0624 08/21/20 0535 08/22/20 0042 08/23/20 0755 08/24/20 0411  NA 143 139 139 136 136  K 2.9* 3.4* 3.7 3.6 4.0  CL 101 103 102 102 104  CO2 29 28 26 26 25   GLUCOSE 131* 125* 120* 137* 169*  BUN 10 7 7 8 8   CREATININE 0.65 0.63 0.71 0.72 0.69  CALCIUM 8.1* 7.7* 7.9* 7.7* 8.2*  MG 1.7 1.9 1.8 2.1 1.7  PHOS  --  2.4* 3.6 2.8 2.9   GFR: Estimated Creatinine Clearance: 109.7 mL/min (by C-G formula based on SCr of 0.69 mg/dL). Liver Function Tests: Recent Labs  Lab 08/20/20 0624 08/21/20 0535 08/22/20 0042 08/23/20 0755 08/24/20 0411  AST 26  --  27 22 22   ALT 20  --  22 20 21   ALKPHOS 76  --  76 75 74  BILITOT 0.7  --  0.8 0.7 0.6  PROT 5.4*  --  5.4* 5.2* 5.6*  ALBUMIN 2.5* 2.3* 2.4* 2.4* 2.5*   No results for input(s): LIPASE, AMYLASE in the last 168 hours. No results for input(s): AMMONIA in the last 168 hours. Coagulation Profile: No results for input(s): INR, PROTIME in the last 168 hours. Cardiac Enzymes: No results for input(s): CKTOTAL, CKMB, CKMBINDEX, TROPONINI in the last 168 hours. BNP (last 3 results) No results for input(s): PROBNP in the last 8760 hours. HbA1C: No results for input(s): HGBA1C in the last 72 hours. CBG: Recent Labs  Lab 08/23/20 1106 08/23/20 1440 08/23/20 2106 08/24/20 0518 08/24/20 0748  GLUCAP 129* 141* 142* 162* 139*   Lipid  Profile: Recent Labs    08/22/20 0042  TRIG 94   Thyroid Function Tests: No results for input(s): TSH, T4TOTAL, FREET4, T3FREE, THYROIDAB in the last 72 hours. Anemia Panel: No results for input(s): VITAMINB12, FOLATE, FERRITIN, TIBC, IRON, RETICCTPCT in the last 72 hours. Sepsis Labs: Recent Labs  Lab 08/18/20 0942  LATICACIDVEN 1.3    No results found for this or any previous visit (from the past 240 hour(s)).    Radiology Studies: No results found.    Scheduled Meds: . Chlorhexidine Gluconate Cloth  6 each Topical Daily  . insulin aspart  0-9 Units Subcutaneous Q8H  . insulin glargine  7 Units  Subcutaneous QHS  . metoCLOPramide (REGLAN) injection  5 mg Intravenous Q8H  . metoprolol tartrate  7.5 mg Intravenous Q6H  . sodium chloride flush  10-40 mL Intracatheter Q12H   Continuous Infusions: . dextrose 5 % and 0.9 % NaCl with KCl 40 mEq/L 10 mL/hr at 08/23/20 1828  . heparin 1,100 Units/hr (08/24/20 0839)  . TPN ADULT (ION) 65 mL/hr at 08/23/20 1737     LOS: 12 days      Time spent: 25 minutes   Dessa Phi, DO [facility] 08/24/2020, 11:42 AM   Available via Epic secure chat 7am-7pm After these hours, please refer to coverage provider listed on [email or web address]

## 2020-08-24 NOTE — Progress Notes (Signed)
ANTICOAGULATION CONSULT NOTE - Follow Up Consult  Pharmacy Consult for Heparin (on apixaban PTA) Indication: history of DVT  Allergies  Allergen Reactions  . Hctz [Hydrochlorothiazide]     Hx of gout; uric acid 14.1 on 03/13/20 Acute gout L elbow 8/11-8/24/21    Patient Measurements: Height: 5\' 5"  (165.1 cm) Weight: 105.3 kg (232 lb 2.3 oz) IBW/kg (Calculated) : 61.5 Heparin Dosing Weight: 85.7 kg  Vital Signs: Temp: 97.9 F (36.6 C) (10/07 1104) BP: 168/109 (10/07 1104) Pulse Rate: 90 (10/07 1104)  Labs: Recent Labs    08/22/20 0042 08/22/20 0042 08/22/20 0810 08/22/20 1559 08/23/20 0755 08/24/20 0411  HGB 8.3*   < >  --   --  8.3* 8.4*  HCT 27.7*  --   --   --  26.0* 27.4*  PLT 253  --   --   --  214 200  HEPARINUNFRC 0.28*  --    < > 0.40 0.34 0.30  CREATININE 0.71  --   --   --  0.72 0.69   < > = values in this interval not displayed.    Estimated Creatinine Clearance: 109.7 mL/min (by C-G formula based on SCr of 0.69 mg/dL).   Medications:  Infusions:  . dextrose 5 % and 0.9 % NaCl with KCl 40 mEq/L 10 mL/hr at 08/23/20 1828  . heparin 1,100 Units/hr (08/24/20 0839)  . magnesium sulfate bolus IVPB    . TPN ADULT (ION) 65 mL/hr at 08/23/20 1737  . TPN ADULT (ION)      Assessment: 30 y/oM with PMH of DVT (07/03/2020) on Apixaban PTA admitted on 08/10/2020 with SIRS. PO meds held d/t colonic ileus. Pharmacy consulted for IV heparin dosing while Apixaban on hold.    Heparin level remains therapeutic on current IV heparin rate of 1100 units/hr  CBC stable  No line or bleeding issues noted   Goal of Therapy:  Heparin level 0.3-0.7 units/ml Monitor platelets by anticoagulation protocol: Yes   Plan:   Continue heparin IV infusion at 1100 units/hr  Daily heparin level and CBC  Monitor for signs and symptoms of bleeding     Royetta Asal, PharmD, BCPS 08/24/2020 11:56 AM

## 2020-08-24 NOTE — Care Management Important Message (Signed)
Important Message  Patient Details IM Letter given to the Patient Name: [name] MRN: [id number] Date of Birth: Jun 03, 1961   Medicare Important Message Given:  Yes     Kerin Salen 08/24/2020, 12:25 PM

## 2020-08-24 NOTE — Plan of Care (Signed)
  Problem: Consults Goal: RH ADOLESCENT PATIENT EDUCATION Description: Description: See Patient Education module for education specifics. Outcome: Progressing Goal: Skin Care Protocol Initiated - if Braden Score 18 or less Description: If consults are not indicated, leave blank or document N/A Outcome: Progressing Goal: Nutrition Consult-if indicated Outcome: Progressing Goal: Diabetes Guidelines if Diabetic/Glucose > 140 Description: If diabetic or lab glucose is > 140 mg/dl - Initiate Diabetes/Hyperglycemia Guidelines & Document Interventions  Outcome: Progressing   Problem: RH BOWEL ELIMINATION Goal: RH STG MANAGE BOWEL WITH ASSISTANCE Description: STG Manage Bowel with Assistance. Outcome: Progressing Goal: RH STG MANAGE BOWEL W/MEDICATION W/ASSISTANCE Description: STG Manage Bowel with Medication with Assistance. Outcome: Progressing   Problem: RH BLADDER ELIMINATION Goal: RH STG MANAGE BLADDER WITH ASSISTANCE Description: STG Manage Bladder With Assistance Outcome: Progressing Goal: RH STG MANAGE BLADDER WITH MEDICATION WITH ASSISTANCE Description: STG Manage Bladder With Medication With Assistance. Outcome: Progressing Goal: RH STG MANAGE BLADDER WITH EQUIPMENT WITH ASSISTANCE Description: STG Manage Bladder With Equipment With Assistance Outcome: Progressing   Problem: RH SKIN INTEGRITY Goal: RH STG SKIN FREE OF INFECTION/BREAKDOWN Outcome: Progressing Goal: RH STG MAINTAIN SKIN INTEGRITY WITH ASSISTANCE Description: STG Maintain Skin Integrity With Assistance. Outcome: Progressing Goal: RH STG ABLE TO PERFORM INCISION/WOUND CARE W/ASSISTANCE Description: STG Able To Perform Incision/Wound Care With Assistance. Outcome: Progressing   Problem: RH SAFETY Goal: RH STG ADHERE TO SAFETY PRECAUTIONS W/ASSISTANCE/DEVICE Description: STG Adhere to Safety Precautions With Assistance/Device. Outcome: Progressing Goal: RH STG DECREASED RISK OF FALL WITH  ASSISTANCE Description: STG Decreased Risk of Fall With Assistance. Outcome: Progressing   Problem: RH COGNITION-NURSING Goal: RH STG USES MEMORY AIDS/STRATEGIES W/ASSIST TO PROBLEM SOLVE Description: STG Uses Memory Aids/Strategies With Assistance to Problem Solve. Outcome: Progressing Goal: RH STG ANTICIPATES NEEDS/CALLS FOR ASSIST W/ASSIST/CUES Description: STG Anticipates Needs/Calls for Assist With Assistance/Cues. Outcome: Progressing   Problem: RH PAIN MANAGEMENT Goal: RH STG PAIN MANAGED AT OR BELOW PT'S PAIN GOAL Outcome: Progressing   Problem: RH KNOWLEDGE DEFICIT Goal: RH STG INCREASE KNOWLEDGE OF DIABETES Outcome: Progressing Goal: RH STG INCREASE KNOWLEDGE OF HYPERTENSION Outcome: Progressing Goal: RH STG INCREASE KNOWLEDGE OF DYSPHAGIA/FLUID INTAKE Outcome: Progressing Goal: RH STG INCREASE KNOWLEGDE OF HYPERLIPIDEMIA Outcome: Progressing Goal: RH STG INCREASE KNOWLEDGE OF STROKE PROPHYLAXIS Outcome: Progressing

## 2020-08-24 NOTE — Plan of Care (Signed)
  Problem: Education: Goal: Knowledge of General Education information will improve Description: Including pain rating scale, medication(s)/side effects and non-pharmacologic comfort measures Outcome: Completed/Met

## 2020-08-24 NOTE — Progress Notes (Signed)
Physical Therapy Treatment Patient Details Name: [name] MRN: [id number] DOB: 12-Dec-1960 Today's Date: 08/24/2020    History of Present Illness 59 yo male admitted to [facility] on 9/24 with weakness, joint pain, difficulty urinating. Pt meeting SIRS criteria, possibly secondary to urinary infection. NGT placed 10/1 for ileus. Now clamped. PMHx significant for chronic gouty arthritis of multiple joints, CKD II, IDDM, HTN, HLD, and chronic anemia. Admitted to [facility] 8/11-8/24 for AKI, LUE DVT, and joint pain, d/c SNF.    PT Comments    Pt OOB in recliner drinking his clear liquid lunch.  Feeling "okay".  Assisted with amb.  General transfer comment: pt able to rise with MinMod  Assist from recliner to partial upright to walker but fatigues quickly  Used EVA walker this session for increase support.  General Gait Details: Used B platform EVA walker this session for increased support and to increase amb distance.  Resting HR started at 104 and increased to 149 after 11 feet.  Recliner pulled to pt and assisted with a seated rest break.  Allowed pt a 4 min rest break and assisted again.  Very limited activity tolerance.  Max c/o weakness and poor endurance.  Follow Up Recommendations  SNF     Equipment Recommendations  None recommended by PT    Recommendations for Other Services       Precautions / Restrictions Precautions Precautions: Fall Precaution Comments: NG tube clamped on clears    Mobility  Bed Mobility               General bed mobility comments: OOB in recliner  Transfers Overall transfer level: Needs assistance Equipment used: Bilateral platform walker (EVA walker) Transfers: Sit to/from Stand Sit to Stand: Min assist;Mod assist         General transfer comment: pt able to rise with MinMod  Assist from recliner to partial upright to walker but fatigues quickly  Used EVA walker this session for increase support  Ambulation/Gait Ambulation/Gait assistance: Mod  assist;+2 physical assistance Gait Distance (Feet): 22 Feet (11 feet X 2) Assistive device: Bilateral platform walker (EVA walker) Gait Pattern/deviations: Step-to pattern;Decreased step length - left;Decreased step length - right Gait velocity: decreased   General Gait Details: Used B platform EVA walker this session for increased support and to increase amb distance.  Resting HR started at 104 and increased to 149 after 11 feet.  Recliner pulled to pt and assisted with a seated rest break.  Allowed pt a 4 min rest break and assisted again.  Very limited activity tolerance.  Max c/o weakness and poor endurance.   Stairs             Wheelchair Mobility    Modified Rankin (Stroke Patients Only)       Balance                                            Cognition Arousal/Alertness: Awake/alert Behavior During Therapy: WFL for tasks assessed/performed Overall Cognitive Status: Within Functional Limits for tasks assessed                                 General Comments: AxO x 3 more engaging, polite and very willing to try      Exercises      General Comments  Pertinent Vitals/Pain Pain Assessment: No/denies pain    Home Living                      Prior Function            PT Goals (current goals can now be found in the care plan section) Progress towards PT goals: Progressing toward goals    Frequency    Min 2X/week      PT Plan Current plan remains appropriate    Co-evaluation              AM-PAC PT "6 Clicks" Mobility   Outcome Measure  Help needed turning from your back to your side while in a flat bed without using bedrails?: A Lot Help needed moving from lying on your back to sitting on the side of a flat bed without using bedrails?: A Lot Help needed moving to and from a bed to a chair (including a wheelchair)?: A Lot   Help needed to walk in hospital room?: A Lot Help needed climbing  3-5 steps with a railing? : Total 6 Click Score: 9    End of Session Equipment Utilized During Treatment: Gait belt Activity Tolerance: Patient limited by fatigue Patient left: in chair;with chair alarm set   PT Visit Diagnosis: Muscle weakness (generalized) (M62.81);Other abnormalities of gait and mobility (R26.89)     Time: 0623-7628 PT Time Calculation (min) (ACUTE ONLY): 25 min  Charges:  $Gait Training: 8-22 mins $Therapeutic Activity: 8-22 mins                     Rica Koyanagi  PTA Acute  Rehabilitation Services Pager      [phone] Office      [phone]

## 2020-08-24 NOTE — Progress Notes (Addendum)
Subjective: No complaints.  One BM yesterday.  Passing flatus.  Objective: Vital signs in last 24 hours: Temp:  [97.9 F (36.6 C)-98.6 F (37 C)] 98.6 F (37 C) (10/06 2024) Pulse Rate:  [89] 89 (10/06 1257) Resp:  [17] 17 (10/06 1257) BP: (147-167)/(86-100) 147/86 (10/06 2024) SpO2:  [100 %] 100 % (10/06 1257) Last BM Date: 08/22/20  Intake/Output from previous day: 10/06 0701 - 10/07 0700 In: 1971.8 [I.V.:1571.8; IV Piggyback:400] Out: 3725 [Urine:575; Emesis/NG output:3150] Intake/Output this shift: Total I/O In: 683.2 [I.V.:683.2] Out: 2200 [Emesis/NG output:2200]  General appearance: alert and no distress GI: distended, tympanic, +BS, soft, but firmer today.  Lab Results: Recent Labs    08/22/20 0042 08/23/20 0755 08/24/20 0411  WBC 4.8 4.3 4.2  HGB 8.3* 8.3* 8.4*  HCT 27.7* 26.0* 27.4*  PLT 253 214 200   BMET Recent Labs    08/22/20 0042 08/23/20 0755 08/24/20 0411  NA 139 136 136  K 3.7 3.6 4.0  CL 102 102 104  CO2 26 26 25   GLUCOSE 120* 137* 169*  BUN 7 8 8   CREATININE 0.71 0.72 0.69  CALCIUM 7.9* 7.7* 8.2*   LFT Recent Labs    08/24/20 0411  PROT 5.6*  ALBUMIN 2.5*  AST 22  ALT 21  ALKPHOS 74  BILITOT 0.6   PT/INR No results for input(s): LABPROT, INR in the last 72 hours. Hepatitis Panel No results for input(s): HEPBSAG, HCVAB, HEPAIGM, HEPBIGM in the last 72 hours. C-Diff No results for input(s): CDIFFTOX in the last 72 hours. Fecal Lactopherrin No results for input(s): FECLLACTOFRN in the last 72 hours.  Studies/Results: DG Abd 1 View  Result Date: 08/22/2020 CLINICAL DATA:  Follow-up ileus. EXAM: ABDOMEN - 1 VIEW COMPARISON:  08/21/2020. FINDINGS: NG tube noted with its tip again noted over the duodenum. Distended loops of small and large bowel again noted. Similar findings on prior exam. Findings most consistent adynamic ileus. Follow-up exam suggested to demonstrate resolution and to exclude bowel obstruction. Degenerative  change thoracolumbar spine. IMPRESSION: NG tube noted with its tip again noted over the duodenum. Distended loops of small and large bowel again noted. Similar findings on prior exam. Findings most consistent adynamic ileus. Follow-up exam suggested to demonstrate resolution and to exclude bowel obstruction. Electronically Signed   By: [place]   On: 08/22/2020 08:30    Medications:  Scheduled: . Chlorhexidine Gluconate Cloth  6 each Topical Daily  . insulin aspart  0-9 Units Subcutaneous Q8H  . insulin glargine  7 Units Subcutaneous QHS  . metoCLOPramide (REGLAN) injection  5 mg Intravenous Q8H  . metoprolol tartrate  7.5 mg Intravenous Q6H  . sodium chloride flush  10-40 mL Intracatheter Q12H   Continuous: . dextrose 5 % and 0.9 % NaCl with KCl 40 mEq/L 10 mL/hr at 08/23/20 1828  . heparin 1,100 Units/hr (08/23/20 0843)  . TPN ADULT (ION) 65 mL/hr at 08/23/20 1737    Assessment/Plan: 1) Ileus. 2) Malnutrition.   Subjectively his abdomen is minimally firmer today.  He states that he ambulated yesterday and he was up and in the chair.  There is no evidence of vascular compromise to his bowels and it appears that his ileus has stagnated.  The NG tube is of questionable benefit after 6 days.  Since he is not in pain, and he is passing flatus and stools, his diet will be advanced to clears.  The suctioning will be held.  If there is no discernable worsening of his  abdomen with clears, his PO intake can be gradually increased.  The thought is that maybe some enteric stimulation will assist in peristalsis and resolution of his ileus.  Plan: 1) Trial of clear liquid. 2) Hold NG suctioning.  LOS: 12 days   Danelly Hassinger D 08/24/2020, 6:30 AM

## 2020-08-25 ENCOUNTER — Other Ambulatory Visit: Payer: Self-pay | Admitting: Adult Health

## 2020-08-25 DIAGNOSIS — R6510 Systemic inflammatory response syndrome (SIRS) of non-infectious origin without acute organ dysfunction: Secondary | ICD-10-CM | POA: Diagnosis not present

## 2020-08-25 DIAGNOSIS — I10 Essential (primary) hypertension: Secondary | ICD-10-CM

## 2020-08-25 DIAGNOSIS — D509 Iron deficiency anemia, unspecified: Secondary | ICD-10-CM

## 2020-08-25 DIAGNOSIS — F5101 Primary insomnia: Secondary | ICD-10-CM

## 2020-08-25 DIAGNOSIS — E1169 Type 2 diabetes mellitus with other specified complication: Secondary | ICD-10-CM

## 2020-08-25 DIAGNOSIS — K219 Gastro-esophageal reflux disease without esophagitis: Secondary | ICD-10-CM

## 2020-08-25 LAB — BASIC METABOLIC PANEL
Anion gap: 7 (ref 5–15)
BUN: 12 mg/dL (ref 6–20)
CO2: 23 mmol/L (ref 22–32)
Calcium: 8.4 mg/dL — ABNORMAL LOW (ref 8.9–10.3)
Chloride: 104 mmol/L (ref 98–111)
Creatinine, Ser: 0.7 mg/dL (ref 0.61–1.24)
GFR calc non Af Amer: >60 mL/min (ref >60)
Glucose, Bld: 218 mg/dL — ABNORMAL HIGH (ref 70–99)
Potassium: 4 mmol/L (ref 3.5–5.1)
Sodium: 134 mmol/L — ABNORMAL LOW (ref 135–145)

## 2020-08-25 LAB — GLUCOSE, CAPILLARY
Glucose-Capillary: 160 mg/dL — ABNORMAL HIGH (ref 70–99)
Glucose-Capillary: 164 mg/dL — ABNORMAL HIGH (ref 70–99)
Glucose-Capillary: 175 mg/dL — ABNORMAL HIGH (ref 70–99)
Glucose-Capillary: 184 mg/dL — ABNORMAL HIGH (ref 70–99)
Glucose-Capillary: 219 mg/dL — ABNORMAL HIGH (ref 70–99)

## 2020-08-25 LAB — CBC
HCT: 29 % — ABNORMAL LOW (ref 39.0–52.0)
Hemoglobin: 8.8 g/dL — ABNORMAL LOW (ref 13.0–17.0)
MCH: 24 pg — ABNORMAL LOW (ref 26.0–34.0)
MCHC: 30.3 g/dL (ref 30.0–36.0)
MCV: 79.2 fL — ABNORMAL LOW (ref 80.0–100.0)
Platelets: 204 10*3/uL (ref 150–400)
RBC: 3.66 MIL/uL — ABNORMAL LOW (ref 4.22–5.81)
RDW: 22.9 % — ABNORMAL HIGH (ref 11.5–15.5)
WBC: 5.8 10*3/uL (ref 4.0–10.5)
nRBC: 0.3 % — ABNORMAL HIGH (ref 0.0–0.2)

## 2020-08-25 LAB — MAGNESIUM: Magnesium: 1.8 mg/dL (ref 1.7–2.4)

## 2020-08-25 LAB — PHOSPHORUS: Phosphorus: 3.5 mg/dL (ref 2.5–4.6)

## 2020-08-25 LAB — HEPARIN LEVEL (UNFRACTIONATED)
Heparin Unfractionated: 0.25 IU/mL — ABNORMAL LOW (ref 0.30–0.70)
Heparin Unfractionated: 0.39 IU/mL (ref 0.30–0.70)
Heparin Unfractionated: 0.43 IU/mL (ref 0.30–0.70)

## 2020-08-25 MED ORDER — MAGNESIUM SULFATE 2 GM/50ML IV SOLN
2.0000 g | Freq: Once | INTRAVENOUS | Status: AC
  Administered 2020-08-25: 2 g via INTRAVENOUS
  Filled 2020-08-25: qty 50

## 2020-08-25 MED ORDER — HEPARIN (PORCINE) 25000 UT/250ML-% IV SOLN
1300.0000 [IU]/h | INTRAVENOUS | Status: DC
  Administered 2020-08-27: 1300 [IU]/h via INTRAVENOUS
  Filled 2020-08-25: qty 500
  Filled 2020-08-25: qty 250

## 2020-08-25 MED ORDER — INSULIN ASPART 100 UNIT/ML ~~LOC~~ SOLN
0.0000 [IU] | Freq: Four times a day (QID) | SUBCUTANEOUS | Status: DC
  Administered 2020-08-25 (×2): 3 [IU] via SUBCUTANEOUS
  Administered 2020-08-26 (×2): 5 [IU] via SUBCUTANEOUS
  Administered 2020-08-26 – 2020-08-27 (×3): 3 [IU] via SUBCUTANEOUS
  Administered 2020-08-27: 5 [IU] via SUBCUTANEOUS
  Administered 2020-08-27 (×2): 3 [IU] via SUBCUTANEOUS
  Administered 2020-08-28 – 2020-08-29 (×6): 5 [IU] via SUBCUTANEOUS

## 2020-08-25 MED ORDER — TRAVASOL 10 % IV SOLN
INTRAVENOUS | Status: AC
  Filled 2020-08-25: qty 1080

## 2020-08-25 NOTE — Progress Notes (Signed)
Physical Therapy Treatment Patient Details Name: [name] MRN: [id number] DOB: Jan 10, 1961 Today's Date: 08/25/2020    History of Present Illness 59 yo male admitted to [facility] on 9/24 with weakness, joint pain, difficulty urinating. Pt meeting SIRS criteria, possibly secondary to urinary infection. NGT placed 10/1 for ileus. Now clamped. PMHx significant for chronic gouty arthritis of multiple joints, CKD II, IDDM, HTN, HLD, and chronic anemia. Admitted to [facility] 8/11-8/24 for AKI, LUE DVT, and joint pain, d/c SNF.    PT Comments    Pt is NOT progressing with his mobility limited by HR.  Today he was only able to amb 4 feet as HR increased to 150's.  RN in room and assisted.  Pt visible fatigued from little activity.     Follow Up Recommendations  SNF     Equipment Recommendations  None recommended by PT    Recommendations for Other Services       Precautions / Restrictions Precautions Precautions: Fall Precaution Comments: NG tube D/C Restrictions Weight Bearing Restrictions: No    Mobility  Bed Mobility               General bed mobility comments: OOB in recliner  Transfers Overall transfer level: Needs assistance Equipment used: Bilateral platform walker Transfers: Sit to/from Stand Sit to Stand: Mod assist         General transfer comment: required increased assist this session  Ambulation/Gait Ambulation/Gait assistance: Mod assist;+2 physical assistance Gait Distance (Feet): 4 Feet Assistive device: Bilateral platform walker Gait Pattern/deviations: Step-to pattern;Decreased step length - left;Decreased step length - right Gait velocity: decreased   General Gait Details: used B platform EVA walker for increased support.  RN assisted.  Distance limited by V Tac as HR increased to 150's with only amb 4 feet.  Pt visibly fatigued.  Recliner brought to pt from behind.   Stairs             Wheelchair Mobility    Modified Rankin (Stroke Patients  Only)       Balance   Sitting-balance support: Feet supported;No upper extremity supported   Sitting balance - Comments: Sat at edge of chair without upper extremity support for ADLs and exercises.                                    Cognition Arousal/Alertness: Awake/alert Behavior During Therapy: WFL for tasks assessed/performed Overall Cognitive Status: Within Functional Limits for tasks assessed                                 General Comments: Alert and pleasant.   quiet      Exercises Shoulder Exercises Shoulder Flexion: AROM;20 reps;Seated;Left;Right Elbow Flexion: 20 reps;Seated;Left;Right Wrist Extension: Left;Right;Seated;AROM;20 reps Digit Composite Flexion: Right;Left;AROM;Squeeze ball;20 reps    General Comments        Pertinent Vitals/Pain Pain Assessment: No/denies pain    Home Living                      Prior Function            PT Goals (current goals can now be found in the care plan section) Acute Rehab PT Goals Patient Stated Goal: get stronger Progress towards PT goals: Progressing toward goals    Frequency    Min 2X/week  PT Plan Current plan remains appropriate    Co-evaluation       OT goals addressed during session: Strengthening/ROM;ADL's and self-care      AM-PAC PT "6 Clicks" Mobility   Outcome Measure  Help needed turning from your back to your side while in a flat bed without using bedrails?: A Lot Help needed moving from lying on your back to sitting on the side of a flat bed without using bedrails?: A Lot Help needed moving to and from a bed to a chair (including a wheelchair)?: A Lot Help needed standing up from a chair using your arms (e.g., wheelchair or bedside chair)?: A Lot Help needed to walk in hospital room?: A Lot Help needed climbing 3-5 steps with a railing? : Total 6 Click Score: 11    End of Session Equipment Utilized During Treatment: Gait belt Activity  Tolerance: Patient limited by fatigue;Other (comment) (tachy) Patient left: in chair;with chair alarm set Nurse Communication: Mobility status PT Visit Diagnosis: Muscle weakness (generalized) (M62.81);Other abnormalities of gait and mobility (R26.89)     Time: 9450-3888 PT Time Calculation (min) (ACUTE ONLY): 18 min  Charges:  $Gait Training: 8-22 mins                     Rica Koyanagi  PTA Acute  Rehabilitation Services Pager      [phone] Office      [phone]

## 2020-08-25 NOTE — Progress Notes (Addendum)
PROGRESS NOTE    [name]  XNA:[id number] DOB: 07-11-1961 DOA: 08/10/2020 PCP: Charlott Rakes, MD     Brief Narrative:  [name] is a 59 yo male with past medical history significant of type 2 diabetes, chronic gout who was recently hospitalized 8/11-8/24 with AKI, at that time found to have left upper extremity DVT in addition to polyarticular gout.  He now comes in with sepsis, UTI, bowel obstruction.  New events last 24 hours / Subjective: Had a bowel movement this morning, has been tolerating clear liquids without any issues.  Denies any abdominal pain or nausea or vomiting.  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  Currently on IV heparin due to above   CKD stage II -Stable  Sepsis secondary to E. coli UTI, present on admission -Sepsis ruled in -Completed antibiotics  Iron deficiency anemia -Status post IV Feraheme 9/25    DVT prophylaxis: IV heparin   Code Status: Full  code Family Communication: No family at bedside Disposition Plan:  Status is: Inpatient  Remains inpatient appropriate because:IV treatments appropriate due to intensity of illness or inability to take PO   Dispo:  Patient From: Home  Planned Disposition: [place]  Expected discharge date: 08/27/20  Medically stable for discharge: No .  He remains on TPN    Antimicrobials:  Anti-infectives (From admission, onward)   Start     Dose/Rate Route Frequency Ordered Stop   08/15/20 1600  ceFAZolin (ANCEF) IVPB 1 g/50 mL premix        1 g 100 mL/hr over 30 Minutes Intravenous Every 8 hours 08/15/20 1429 08/19/20 2243   08/14/20 1730  cefTRIAXone (ROCEPHIN) 1 g in sodium chloride 0.9 % 100 mL IVPB  Status:  Discontinued        1 g 200 mL/hr over 30 Minutes Intravenous Every 24 hours 08/14/20 1647 08/15/20 1429   08/12/20 1230  cephALEXin (KEFLEX) capsule 500 mg  Status:  Discontinued        500 mg Oral Every 12 hours 08/12/20 1139 08/14/20 1647       Objective: Vitals:   08/24/20 1555 08/24/20 1626 08/24/20 2114 08/25/20 0500  BP: (!) 180/104  (!) 167/90   Pulse:      Resp:      Temp:  98 F (36.7 C) 98.3 F (36.8 C) 98 F (36.7 C)  TempSrc:  Axillary Oral Oral  SpO2:      Weight:      Height:        Intake/Output Summary (Last 24  hours) at 08/25/2020 1136 Last data filed at 08/25/2020 0806 Gross per 24 hour  Intake 2464.26 ml  Output 1900 ml  Net 564.26 ml   Filed Weights   08/20/20 0600 08/21/20 0500 08/22/20 0509  Weight: 98.7 kg 108.4 kg 105.3 kg    Examination: General exam: Appears calm and comfortable  Respiratory system: Clear to auscultation. Respiratory effort normal. Cardiovascular system: S1 & S2 heard, RRR. No pedal edema. Gastrointestinal system: Abdomen is mildly distended but soft, nontender to palpation Central nervous system: Alert and oriented. Non focal exam. Speech clear  Extremities: Symmetric in appearance bilaterally  Skin:  No rashes, lesions or ulcers on exposed skin  Psychiatry: Judgement and insight appear stable. Mood & affect appropriate.   Data Reviewed: I have personally reviewed following labs and imaging studies  CBC: Recent Labs  Lab 08/21/20 0535 08/22/20 0042 08/23/20 0755 08/24/20 0411 08/25/20 0530  WBC 3.6* 4.8 4.3 4.2 5.8  NEUTROABS  --  3.2  --   --   --   HGB 8.5* 8.3* 8.3* 8.4* 8.8*  HCT 28.2* 27.7* 26.0* 27.4* 29.0*  MCV 81.0 81.5 78.8* 79.0* 79.2*  PLT 272 253 214 200 952   Basic Metabolic Panel: Recent Labs  Lab 08/21/20 0535 08/22/20 0042 08/23/20 0755 08/24/20 0411 08/25/20 0530  NA 139 139 136 136 134*  K 3.4* 3.7 3.6 4.0 4.0  CL 103 102 102 104 104  CO2 28 26 26 25 23   GLUCOSE 125* 120* 137* 169* 218*  BUN 7 7 8 8 12   CREATININE 0.63 0.71 0.72 0.69 0.70  CALCIUM 7.7* 7.9* 7.7* 8.2* 8.4*  MG 1.9 1.8 2.1 1.7 1.8  PHOS 2.4* 3.6 2.8 2.9 3.5   GFR: Estimated Creatinine Clearance: 109.7 mL/min (by C-G formula based on SCr of 0.7 mg/dL). Liver Function Tests: Recent Labs  Lab 08/20/20 0624 08/21/20 0535 08/22/20 0042 08/23/20 0755 08/24/20 0411  AST 26  --  27 22 22   ALT 20  --  22 20 21   ALKPHOS 76  --  76 75 74  BILITOT 0.7  --  0.8 0.7 0.6  PROT 5.4*  --  5.4* 5.2* 5.6*  ALBUMIN 2.5* 2.3* 2.4* 2.4* 2.5*   No results for input(s): LIPASE, AMYLASE in the last 168 hours. No results for input(s): AMMONIA in the last 168 hours. Coagulation Profile: No results for input(s): INR, PROTIME in the last 168 hours. Cardiac Enzymes: No results for input(s): CKTOTAL, CKMB, CKMBINDEX, TROPONINI in the last 168 hours. BNP (last 3 results) No results for input(s): PROBNP in the last 8760 hours. HbA1C: No results for input(s): HGBA1C in the last 72 hours. CBG: Recent Labs  Lab 08/24/20 0748 08/24/20 1624 08/24/20 2203 08/25/20 0512 08/25/20 0804  GLUCAP 139* 163* 169* 219* 184*   Lipid Profile: No results for input(s): CHOL, HDL, LDLCALC, TRIG, CHOLHDL,  LDLDIRECT in the last 72 hours. Thyroid Function Tests: No results for input(s): TSH, T4TOTAL, FREET4, T3FREE, THYROIDAB in the last 72 hours. Anemia Panel: No results for input(s): VITAMINB12, FOLATE, FERRITIN, TIBC, IRON, RETICCTPCT in the last 72 hours. Sepsis Labs: No results for input(s): PROCALCITON, LATICACIDVEN in the last 168 hours.  No results found for this or any previous visit (from the past 240 hour(s)).    Radiology Studies: No results found.    Scheduled Meds: . Chlorhexidine Gluconate Cloth  6 each Topical Daily  . insulin aspart  0-15 Units Subcutaneous Q6H  . insulin glargine  7 Units Subcutaneous QHS  .  metoCLOPramide (REGLAN) injection  5 mg Intravenous Q8H  . metoprolol tartrate  7.5 mg Intravenous Q6H  . sodium chloride flush  10-40 mL Intracatheter Q12H   Continuous Infusions: . dextrose 5 % and 0.9 % NaCl with KCl 40 mEq/L 10 mL/hr at 08/24/20 1800  . heparin 1,300 Units/hr (08/25/20 0853)  . magnesium sulfate bolus IVPB    . TPN ADULT (ION) 90 mL/hr at 08/24/20 1814  . TPN ADULT (ION)       LOS: 13 days      Time spent: 25 minutes   Dessa Phi, DO [facility] 08/25/2020, 11:36 AM   Available via Epic secure chat 7am-7pm After these hours, please refer to coverage provider listed on [email or web address]

## 2020-08-25 NOTE — Progress Notes (Signed)
PHARMACY - TOTAL PARENTERAL NUTRITION CONSULT NOTE   Indication: Prolonged ileus  Patient Measurements: Height: 5\' 5"  (165.1 cm) Weight: 105.3 kg (232 lb 2.3 oz) IBW/kg (Calculated) : 61.5 TPN AdjBW (KG): 72.7 Body mass index is 38.63 kg/m. Usual Weight:   Assessment:  59 yo male with PMH gout, OA, DM2, anemia admitted with upper extremity DVT and development of colonic ileus. GI following, recommend NGT to LIWS, s/p neostigmine 10/1, no surgical indications at this point unless he perforates or has ischemia, have signed off 10/4.   Glucose / Insulin: Hx DM, A1c 7.18 Jun 2020. CBGs rising - range 139-219 on Lantus 7 units qHS - 9 units of insulin given yesterday  Electrolytes: Na 134 down from 136, magnesium 1.8 after 2g mag sulfate IV x 1 yesterday, others WNL, CorrCa WNL   - goal K >/= 4.0, Mg >/= 2.0 with ileus Renal: Hx CKD III. SCr WNL LFTs / TGs: LFTs WNL/TG 94 (10/5) Prealbumin / albumin: PAB 6.5/Alb 2.4 (10/5) Intake / Output; MIVF: I/O -1513 ml; NG 1050 mL output; D5NS KCl 27mEq/L at [place] on IV reglan q8h GI Imaging: - 9/27 CT abd/pelvis: findings consistent with colonic ileus, pneumatosis of the ascending and hepatic flexure of the colon. - 10/3 AXR:  stable diffuse colonic dilatation is noted most consistent with colonic ileus/adynamic ileus with progression and colonic pneumatosis. Surgeries / Procedures:   Central access: plan PICC TPN start date: plan 10/5  Nutritional Goals (per RD recommendation on 10/6):  Kcal:  1850-2050  Protein:  95-110g  Fluid:  2L/day  Goal TPN rate is 90 mL/hr (provides 108 g of protein and 2181 kcals per day)  Current Nutrition:  NPO - diet advanced today 10/8  Plan:  Repeat magnesium sulfate 2 g IV x 1 this AM   Continue TPN at 90 mL/hr at 1800, target rate   Electrolytes in TPN: 8mEq/L of Na, 44mEq/L of K, 14mEq/L of Ca, 7.14mEq/L of Mg, and 69mmol/L of Phos. Cl:Ac ratio 1:1  Standard MVI and trace elements to  TPN  Change SSI to moderate scale and change SSI from q8 to q6   Add 10 units regular insulin to TPN, continue Lantus 7 units qhs  Continue MIVF to KVO at 1800  Monitor TPN labs on Mon/Thurs  CMET/Mg/Phos with AM labs    Adrian Saran, PharmD, BCPS [phone] until 3pm 08/25/2020 8:49 AM

## 2020-08-25 NOTE — Progress Notes (Signed)
Subjective: No complaints.  No reports of any abdominal pain.  He did have a large bowel movement this AM.  Objective: Vital signs in last 24 hours: Temp:  [97.9 F (36.6 C)-98.3 F (36.8 C)] 98 F (36.7 C) (10/08 0500) Pulse Rate:  [87-93] 93 (10/07 1500) Resp:  [16-18] 18 (10/07 1500) BP: (162-180)/(90-109) 167/90 (10/07 2114) SpO2:  [99 %-100 %] 100 % (10/07 1500) Last BM Date: 08/24/20  Intake/Output from previous day: 10/07 0701 - 10/08 0700 In: 2808.2 [P.O.:360; I.V.:2348.2; IV Piggyback:100] Out: 2250 [Urine:2250] Intake/Output this shift: Total I/O In: -  Out: 150 [Emesis/NG output:150]  General appearance: alert and no distress Resp: clear to auscultation bilaterally Cardio: regular rate and rhythm GI: unchanged, distended, tympanic, +BS, soft, but firmer with deeper palpation Extremities: extremities normal, atraumatic, no cyanosis or edema  Lab Results: Recent Labs    08/23/20 0755 08/24/20 0411 08/25/20 0530  WBC 4.3 4.2 5.8  HGB 8.3* 8.4* 8.8*  HCT 26.0* 27.4* 29.0*  PLT 214 200 204   BMET Recent Labs    08/23/20 0755 08/24/20 0411 08/25/20 0530  NA 136 136 134*  K 3.6 4.0 4.0  CL 102 104 104  CO2 26 25 23   GLUCOSE 137* 169* 218*  BUN 8 8 12   CREATININE 0.72 0.69 0.70  CALCIUM 7.7* 8.2* 8.4*   LFT Recent Labs    08/24/20 0411  PROT 5.6*  ALBUMIN 2.5*  AST 22  ALT 21  ALKPHOS 74  BILITOT 0.6   PT/INR No results for input(s): LABPROT, INR in the last 72 hours. Hepatitis Panel No results for input(s): HEPBSAG, HCVAB, HEPAIGM, HEPBIGM in the last 72 hours. C-Diff No results for input(s): CDIFFTOX in the last 72 hours. Fecal Lactopherrin No results for input(s): FECLLACTOFRN in the last 72 hours.  Studies/Results: No results found.  Medications:  Scheduled: . Chlorhexidine Gluconate Cloth  6 each Topical Daily  . insulin aspart  0-15 Units Subcutaneous Q6H  . insulin glargine  7 Units Subcutaneous QHS  . metoCLOPramide  (REGLAN) injection  5 mg Intravenous Q8H  . metoprolol tartrate  7.5 mg Intravenous Q6H  . sodium chloride flush  10-40 mL Intracatheter Q12H   Continuous: . dextrose 5 % and 0.9 % NaCl with KCl 40 mEq/L 10 mL/hr at 08/24/20 1800  . heparin 1,300 Units/hr (08/25/20 0853)  . magnesium sulfate bolus IVPB    . TPN ADULT (ION) 90 mL/hr at 08/24/20 1814  . TPN ADULT (ION)      Assessment/Plan: 1) Ileus. 2) Malnutrition.   I removed his NG tube this AM.  After one week, there was questionable benefit.  He does not have any abdominal pain and he is passing stools and flatus.  The advancement to a clear liquid diet did not exacerbate his ileus.  Clinically, there is no worsening or progression of his ileus.  Plan: 1) Continue with mobility. 2) Even though he is passing stool and flatus, it may not be good to advance his diet beyond clears, or even full liquids for now. 3) Continue with TPN.  LOS: 13 days   Wynn Kernes D 08/25/2020, 11:02 AM

## 2020-08-25 NOTE — Progress Notes (Addendum)
ANTICOAGULATION CONSULT NOTE - Follow Up Consult  Pharmacy Consult for IV heparin (on apixaban PTA) Indication: history of DVT  Allergies  Allergen Reactions  . Hctz [Hydrochlorothiazide]     Hx of gout; uric acid 14.1 on 03/13/20 Acute gout L elbow 8/11-8/24/21    Patient Measurements: Height: 5\' 5"  (165.1 cm) Weight: 105.3 kg (232 lb 2.3 oz) IBW/kg (Calculated) : 61.5 Heparin Dosing Weight: 85.7 kg  Vital Signs: Temp: 98.1 F (36.7 C) (10/08 1331) Temp Source: Oral (10/08 1331) BP: 183/108 (10/08 1331) Pulse Rate: 100 (10/08 1300)  Labs: Recent Labs    08/23/20 0755 08/23/20 0755 08/24/20 0411 08/25/20 0530 08/25/20 1449  HGB 8.3*   < > 8.4* 8.8*  --   HCT 26.0*  --  27.4* 29.0*  --   PLT 214  --  200 204  --   HEPARINUNFRC 0.34   < > 0.30 0.25* 0.39  CREATININE 0.72  --  0.69 0.70  --    < > = values in this interval not displayed.    Estimated Creatinine Clearance: 109.7 mL/min (by C-G formula based on SCr of 0.7 mg/dL).    Assessment: 65 y/oM with PMH of DVT (07/03/2020) on Apixaban PTA admitted on 08/10/2020 with SIRS. PO meds held d/t colonic ileus. Pharmacy consulted for IV heparin dosing while Apixaban on hold.    1449 heparin level = 0.39 units/mL, now therapeutic  CBC: Hgb 8.8, low but stable. Pltc WNL.   No bleeding or infusion issues noted per nursing  Goal of Therapy:  Heparin level 0.3-0.7 units/ml Monitor platelets by anticoagulation protocol: Yes   Plan:   Continue IV heparin at 1300 units/hr  Recheck heparin level in 6 hours to confirm remains within therapeutic range  Daily heparin level and CBC  Monitor closely for signs and symptoms of bleeding   Lindell Spar, PharmD, BCPS Clinical Pharmacist 08/25/2020 3:17 PM    Addendum:  2105 heparin level = 0.43 units/mL, remains therapeutic   Plan:  Continue IV heparin at 1300 units/hr  Daily heparin level, CBC   Lindell Spar, PharmD, BCPS Clinical Pharmacist  08/25/2020  9:29 PM

## 2020-08-25 NOTE — Progress Notes (Signed)
ANTICOAGULATION CONSULT NOTE - Follow Up Consult  Pharmacy Consult for Heparin (on apixaban PTA) Indication: history of DVT  Allergies  Allergen Reactions  . Hctz [Hydrochlorothiazide]     Hx of gout; uric acid 14.1 on 03/13/20 Acute gout L elbow 8/11-8/24/21    Patient Measurements: Height: 5\' 5"  (165.1 cm) Weight: 105.3 kg (232 lb 2.3 oz) IBW/kg (Calculated) : 61.5 Heparin Dosing Weight: 85.7 kg  Vital Signs: Temp: 98 F (36.7 C) (10/08 0500) Temp Source: Oral (10/08 0500) BP: 167/90 (10/07 2114)  Labs: Recent Labs    08/23/20 0755 08/23/20 0755 08/24/20 0411 08/25/20 0530  HGB 8.3*   < > 8.4* 8.8*  HCT 26.0*  --  27.4* 29.0*  PLT 214  --  200 204  HEPARINUNFRC 0.34  --  0.30 0.25*  CREATININE 0.72  --  0.69 0.70   < > = values in this interval not displayed.    Estimated Creatinine Clearance: 109.7 mL/min (by C-G formula based on SCr of 0.7 mg/dL).   Medications:  Infusions:  . dextrose 5 % and 0.9 % NaCl with KCl 40 mEq/L 10 mL/hr at 08/24/20 1800  . heparin 1,100 Units/hr (08/25/20 6301)  . TPN ADULT (ION) 90 mL/hr at 08/24/20 1814    Assessment: 56 y/oM with PMH of DVT (07/03/2020) on Apixaban PTA admitted on 08/10/2020 with SIRS. PO meds held d/t colonic ileus. Pharmacy consulted for IV heparin dosing while Apixaban on hold.    Heparin level now SUBtherapeutic on current rate of 1100 units/hr after being therapeutic for last 3 days  CBC stable  No line or bleeding issues noted   Goal of Therapy:  Heparin level 0.3-0.7 units/ml Monitor platelets by anticoagulation protocol: Yes   Plan:   Increase IV heparin from 1100 units/hr to 1300 units/hr  Recheck heparin level 6 hours after rate increase  Daily heparin level and CBC  Monitor for signs and symptoms of bleeding     Adrian Saran, PharmD, BCPS [phone] until 3pm 08/25/2020 7:49 AM

## 2020-08-25 NOTE — Progress Notes (Signed)
Occupational Therapy Treatment Patient Details Name: [name] MRN: [id number] DOB: [birth year] Today's Date: 08/25/2020    History of present illness 59 yo male admitted to [facility] on 9/24 with weakness, joint pain, difficulty urinating. Pt meeting SIRS criteria, possibly secondary to urinary infection. NGT placed 10/1 for ileus. Now clamped. PMHx significant for chronic gouty arthritis of multiple joints, CKD II, IDDM, HTN, HLD, and chronic anemia. Admitted to [facility] 8/11-8/24 for AKI, LUE DVT, and joint pain, d/c SNF.   OT comments  Patient seated in chair when therapist entered the room. Patient performed UE exercises to improve ROM and strength of upper extremities and therapist provided patient with pseudo squeeze ball to use in room due to decreased ROM in fingers and grip strength. Patient performed grooming task with good quality seated at edge of chair. Patient reports no pain and is pleasant and appreciative of therapy. HR up to 122 with grooming activity but recovered well. Patient progressing towards goal. Continue to recommend short term rehab at discharge.    Follow Up Recommendations  SNF    Equipment Recommendations  None recommended by OT    Recommendations for Other Services      Precautions / Restrictions Precautions Precautions: Fall Restrictions Weight Bearing Restrictions: No       Mobility Bed Mobility                  Transfers                      Balance   Sitting-balance support: Feet supported;No upper extremity supported   Sitting balance - Comments: Sat at edge of chair without upper extremity support for ADLs and exercises.                                   ADL either performed or assessed with clinical judgement   ADL Overall ADL's : Needs assistance/impaired     Grooming: Sitting;Set up;Oral care;Wash/dry face Grooming Details (indicate cue type and reason): Patient seated in recliner. Patient able to brush  teeth and wash face with set up with increased time.                                     Vision Patient Visual Report: No change from baseline     Perception     Praxis      Cognition Arousal/Alertness: Awake/alert Behavior During Therapy: WFL for tasks assessed/performed Overall Cognitive Status: Within Functional Limits for tasks assessed                                 General Comments: Alert and pleasant.        Exercises Shoulder Exercises Shoulder Flexion: AROM;20 reps;Seated;Left;Right Elbow Flexion: 20 reps;Seated;Left;Right Wrist Extension: Left;Right;Seated;AROM;20 reps Digit Composite Flexion: Right;Left;AROM;Squeeze ball;20 reps   Shoulder Instructions       General Comments      Pertinent Vitals/ Pain       Pain Assessment: No/denies pain  Home Living                                          Prior Functioning/Environment  Frequency  Min 2X/week        Progress Toward Goals  OT Goals(current goals can now be found in the care plan section)  Progress towards OT goals: Progressing toward goals  Acute Rehab OT Goals Patient Stated Goal: get stronger OT Goal Formulation: With patient Time For Goal Achievement: 09/08/20 Potential to Achieve Goals: [place] Discharge plan remains appropriate    Co-evaluation          OT goals addressed during session: Strengthening/ROM;ADL's and self-care      AM-PAC OT "6 Clicks" Daily Activity     Outcome Measure   Help from another person eating meals?: A Little Help from another person taking care of personal grooming?: A Little Help from another person toileting, which includes using toliet, bedpan, or urinal?: A Lot Help from another person bathing (including washing, rinsing, drying)?: A Lot Help from another person to put on and taking off regular upper body clothing?: A Lot Help from another person to put on and taking off  regular lower body clothing?: Total 6 Click Score: 13    End of Session    OT Visit Diagnosis: Muscle weakness (generalized) (M62.81);Pain   Activity Tolerance Patient tolerated treatment well   Patient Left in chair;with call bell/phone within reach;with chair alarm set   Nurse Communication  (okay to see per RN.)        Time: 6389-3734 OT Time Calculation (min): 26 min  Charges: OT General Charges $OT Visit: 1 Visit OT Treatments $Self Care/Home Management : 8-22 mins $Therapeutic Exercise: 8-22 mins  Derl Barrow, OTR/L Acute Care Rehab Services  Office [phone] Pager: [place] 08/25/2020, 12:44 PM

## 2020-08-26 ENCOUNTER — Inpatient Hospital Stay (HOSPITAL_COMMUNITY): Payer: Medicare HMO

## 2020-08-26 DIAGNOSIS — K567 Ileus, unspecified: Secondary | ICD-10-CM | POA: Diagnosis not present

## 2020-08-26 LAB — URINALYSIS, ROUTINE W REFLEX MICROSCOPIC
Bilirubin Urine: NEGATIVE
Glucose, UA: NEGATIVE mg/dL
Ketones, ur: NEGATIVE mg/dL
Nitrite: POSITIVE — AB
Protein, ur: NEGATIVE mg/dL
Specific Gravity, Urine: 1.014 (ref 1.005–1.030)
WBC, UA: >50 WBC/hpf — ABNORMAL HIGH (ref 0–5)
pH: 5 (ref 5.0–8.0)

## 2020-08-26 LAB — CBC
HCT: 26.4 % — ABNORMAL LOW (ref 39.0–52.0)
Hemoglobin: 8 g/dL — ABNORMAL LOW (ref 13.0–17.0)
MCH: 24 pg — ABNORMAL LOW (ref 26.0–34.0)
MCHC: 30.3 g/dL (ref 30.0–36.0)
MCV: 79.3 fL — ABNORMAL LOW (ref 80.0–100.0)
Platelets: 183 K/uL (ref 150–400)
RBC: 3.33 MIL/uL — ABNORMAL LOW (ref 4.22–5.81)
RDW: 22.6 % — ABNORMAL HIGH (ref 11.5–15.5)
WBC: 7.6 K/uL (ref 4.0–10.5)
nRBC: 0 % (ref 0.0–0.2)

## 2020-08-26 LAB — COMPREHENSIVE METABOLIC PANEL WITH GFR
ALT: 23 U/L (ref 0–44)
AST: 24 U/L (ref 15–41)
Albumin: 2.6 g/dL — ABNORMAL LOW (ref 3.5–5.0)
Alkaline Phosphatase: 71 U/L (ref 38–126)
Anion gap: 8 (ref 5–15)
BUN: 14 mg/dL (ref 6–20)
CO2: 23 mmol/L (ref 22–32)
Calcium: 8.4 mg/dL — ABNORMAL LOW (ref 8.9–10.3)
Chloride: 104 mmol/L (ref 98–111)
Creatinine, Ser: 0.69 mg/dL (ref 0.61–1.24)
GFR, Estimated: >60 mL/min
Glucose, Bld: 170 mg/dL — ABNORMAL HIGH (ref 70–99)
Potassium: 4.1 mmol/L (ref 3.5–5.1)
Sodium: 135 mmol/L (ref 135–145)
Total Bilirubin: 0.4 mg/dL (ref 0.3–1.2)
Total Protein: 5.6 g/dL — ABNORMAL LOW (ref 6.5–8.1)

## 2020-08-26 LAB — HEPARIN LEVEL (UNFRACTIONATED): Heparin Unfractionated: 0.41 [IU]/mL (ref 0.30–0.70)

## 2020-08-26 LAB — GLUCOSE, CAPILLARY
Glucose-Capillary: 182 mg/dL — ABNORMAL HIGH (ref 70–99)
Glucose-Capillary: 228 mg/dL — ABNORMAL HIGH (ref 70–99)
Glucose-Capillary: 214 mg/dL — ABNORMAL HIGH (ref 70–99)

## 2020-08-26 LAB — MAGNESIUM: Magnesium: 1.9 mg/dL (ref 1.7–2.4)

## 2020-08-26 LAB — PHOSPHORUS: Phosphorus: 3.8 mg/dL (ref 2.5–4.6)

## 2020-08-26 MED ORDER — TRAVASOL 10 % IV SOLN
INTRAVENOUS | Status: AC
  Filled 2020-08-26: qty 1080

## 2020-08-26 MED ORDER — SODIUM CHLORIDE 0.9 % IV SOLN
1.0000 g | INTRAVENOUS | Status: DC
  Administered 2020-08-26 – 2020-08-29 (×4): 1 g via INTRAVENOUS
  Filled 2020-08-26 (×4): qty 1

## 2020-08-26 MED ORDER — ACETAMINOPHEN 325 MG PO TABS
650.0000 mg | ORAL_TABLET | Freq: Four times a day (QID) | ORAL | Status: DC | PRN
  Administered 2020-08-26 – 2020-08-30 (×5): 650 mg via ORAL
  Filled 2020-08-26 (×5): qty 2

## 2020-08-26 MED ORDER — POLYETHYLENE GLYCOL 3350 17 G PO PACK
17.0000 g | PACK | Freq: Every day | ORAL | Status: DC
  Administered 2020-08-26 – 2020-09-11 (×14): 17 g via ORAL
  Filled 2020-08-26 (×15): qty 1

## 2020-08-26 MED ORDER — MAGNESIUM SULFATE 2 GM/50ML IV SOLN
2.0000 g | Freq: Once | INTRAVENOUS | Status: AC
  Administered 2020-08-26: 2 g via INTRAVENOUS
  Filled 2020-08-26: qty 50

## 2020-08-26 NOTE — Progress Notes (Signed)
ANTICOAGULATION CONSULT NOTE - Follow Up Consult  Pharmacy Consult for IV heparin (on apixaban PTA) Indication: history of DVT  Allergies  Allergen Reactions  . Hctz [Hydrochlorothiazide]     Hx of gout; uric acid 14.1 on 03/13/20 Acute gout L elbow 8/11-8/24/21    Patient Measurements: Height: 5\' 5"  (165.1 cm) Weight: 105.3 kg (232 lb 2.3 oz) IBW/kg (Calculated) : 61.5 Heparin Dosing Weight: 85.7 kg  Vital Signs: Temp: 98.5 F (36.9 C) (10/09 0654) Temp Source: Oral (10/09 0654) BP: 172/81 (10/09 0654)  Labs: Recent Labs    08/24/20 0411 08/24/20 0411 08/25/20 0530 08/25/20 0530 08/25/20 1449 08/25/20 2105 08/26/20 0415  HGB 8.4*   < > 8.8*  --   --   --  8.0*  HCT 27.4*  --  29.0*  --   --   --  26.4*  PLT 200  --  204  --   --   --  183  HEPARINUNFRC 0.30   < > 0.25*   < > 0.39 0.43 0.41  CREATININE 0.69  --  0.70  --   --   --  0.69   < > = values in this interval not displayed.    Estimated Creatinine Clearance: 109.7 mL/min (by C-G formula based on SCr of 0.69 mg/dL).    Assessment: 1 y/oM with PMH of DVT (07/03/2020) on Apixaban PTA admitted on 08/10/2020 with SIRS. PO meds held d/t colonic ileus. Pharmacy consulted for IV heparin dosing while Apixaban on hold.    Heparin level therapeutic on current IV heparin rate of 1300 units/hr  CBC: Hgb 8.0, low but stable. Pltc WNL.   No bleeding or infusion issues noted per nursing  Goal of Therapy:  Heparin level 0.3-0.7 units/ml Monitor platelets by anticoagulation protocol: Yes   Plan:   Continue IV heparin at 1300 units/hr  Daily heparin level and CBC  Monitor closely for signs and symptoms of bleeding   Adrian Saran, PharmD, BCPS 08/26/2020 10:04 AM

## 2020-08-26 NOTE — Progress Notes (Signed)
° °   Progress Note WEEKEND COVERAGE FOR DR. HUNG  Subjective  Chief Complaint: Ileus and malnutrition  This morning patient continues to tell me that he is passing flatus and had another bowel movement, he is tolerating his clear liquid diet and has no abdominal pain.    Objective   Vital signs in last 24 hours: Temp:  [98.1 F (36.7 C)-102 F (38.9 C)] 102 F (38.9 C) (10/09 1300) Pulse Rate:  [91-110] 110 (10/09 1200) Resp:  [17-25] 25 (10/09 1200) BP: (146-183)/(80-108) 154/85 (10/09 1200) SpO2:  [96 %-100 %] 97 % (10/09 1200) Last BM Date: 08/25/20 General:    AA male in NAD Heart:  Regular rate and rhythm; no murmurs Lungs: Respirations even and unlabored, lungs CTA bilaterally Abdomen:  Soft, nontender and moderate distension, tympanic. Normal bowel sounds. Extremities:  Without edema. Neurologic:  Alert and oriented,  grossly normal neurologically. Psych:  Cooperative. Normal mood and affect.  Intake/Output from previous day: 10/08 0701 - 10/09 0700 In: 2183.1 [P.O.:910; I.V.:1223.1; IV Piggyback:50] Out: 0962 [Urine:3600; Emesis/NG output:150] Intake/Output this shift: Total I/O In: 360 [P.O.:360] Out: 300 [Urine:300]  Lab Results: Recent Labs    08/24/20 0411 08/25/20 0530 08/26/20 0415  WBC 4.2 5.8 7.6  HGB 8.4* 8.8* 8.0*  HCT 27.4* 29.0* 26.4*  PLT 200 204 183   BMET Recent Labs    08/24/20 0411 08/25/20 0530 08/26/20 0415  NA 136 134* 135  K 4.0 4.0 4.1  CL 104 104 104  CO2 25 23 23   GLUCOSE 169* 218* 170*  BUN 8 12 14   CREATININE 0.69 0.70 0.69  CALCIUM 8.2* 8.4* 8.4*   LFT Recent Labs    08/26/20 0415  PROT 5.6*  ALBUMIN 2.6*  AST 24  ALT 23  ALKPHOS 71  BILITOT 0.4     Assessment / Plan:   Assessment: 1.  Ileus: NG tube removed 08/26/2019 1 AM, no abdominal pain, passing stools and flatus, currently tolerating clear liquid diet 2.  Malnutrition  Plan: 1.  Continue with mobility 2.  Per Dr. Benson Norway even though he is passing  stool and flatus it was not thought a good idea to advance his diet beyond clears or even full liquids for now 3.  Continue PPI TPN 4.  Please await further recommendations from Dr. Havery Moros later today  Thank you for kind consultation, we will continue to follow over the weekend for Dr. Benson Norway.   LOS: 14 days   Levin Erp  08/26/2020, 1:19 PM

## 2020-08-26 NOTE — Progress Notes (Signed)
PROGRESS NOTE    [name]  BOF:[id number] DOB: 28-Aug-1961 DOA: 08/10/2020 PCP: Charlott Rakes, MD     Brief Narrative:  [name] is a 59 yo male with past medical history significant of type 2 diabetes, chronic gout who was recently hospitalized 8/11-8/24 with AKI, at that time found to have left upper extremity DVT in addition to polyarticular gout.  He now comes in with sepsis, UTI, bowel obstruction.  New events last 24 hours / Subjective: Continues to feel well and tolerating clear liquid diet.  Had another bowel movement.  No further abdominal pain, nausea, vomiting.  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  Currently on IV heparin due to above   CKD stage II -Stable  Sepsis secondary to E. coli UTI, present on admission -Sepsis ruled in -Completed antibiotics  Iron deficiency anemia -Status post IV Feraheme 9/25    DVT prophylaxis: IV heparin   Code Status: Full code Family  Communication: No family at bedside Disposition Plan:  Status is: Inpatient  Remains inpatient appropriate because:IV treatments appropriate due to intensity of illness or inability to take PO   Dispo:  Patient From:  Home  Planned Disposition: [place]  Expected discharge date: 2-3 days  Medically stable for discharge:   Not medically stable for discharge.  He remains on TPN    Antimicrobials:  Anti-infectives (From admission, onward)   Start     Dose/Rate Route Frequency Ordered Stop   08/15/20 1600  ceFAZolin (ANCEF) IVPB 1 g/50 mL premix        1 g 100 mL/hr over 30 Minutes Intravenous Every 8 hours 08/15/20 1429 08/19/20 2243   08/14/20 1730  cefTRIAXone (ROCEPHIN) 1 g in sodium chloride 0.9 % 100 mL IVPB  Status:  Discontinued        1 g 200 mL/hr over 30 Minutes Intravenous Every 24 hours 08/14/20 1647 08/15/20 1429   08/12/20 1230  cephALEXin (KEFLEX) capsule 500 mg  Status:  Discontinued        500 mg Oral Every 12 hours 08/12/20 1139 08/14/20 1647       Objective: Vitals:   08/25/20 1700 08/25/20 1800 08/25/20 2144 08/26/20 0654  BP: (!) 170/97 (!) 176/97 (!) 146/80 (!) 172/81  Pulse: 91 91    Resp: 17 17    Temp:   99.4 F (37.4 C) 98.5 F (36.9 C)  TempSrc:   Axillary Oral  SpO2: 100% 100%    Weight:      Height:  Intake/Output Summary (Last 24 hours) at 08/26/2020 1211 Last data filed at 08/26/2020 1034 Gross per 24 hour  Intake 2543.09 ml  Output 3900 ml  Net -1356.91 ml   Filed Weights   08/20/20 0600 08/21/20 0500 08/22/20 0509  Weight: 98.7 kg 108.4 kg 105.3 kg    Examination: General exam: Appears calm and comfortable  Respiratory system: Clear to auscultation. Respiratory effort normal. Cardiovascular system: S1 & S2 heard, RRR. No pedal edema. Gastrointestinal system: Abdomen is mildly distended, soft and nontender. Normal bowel sounds heard. Central nervous system: Alert and oriented. Non focal exam. Speech clear   Extremities: Symmetric in appearance bilaterally  Skin: No rashes, lesions or ulcers on exposed skin  Psychiatry: Judgement and insight appear stable. Mood & affect appropriate.   Data Reviewed: I have personally reviewed following labs and imaging studies  CBC: Recent Labs  Lab 08/22/20 0042 08/23/20 0755 08/24/20 0411 08/25/20 0530 08/26/20 0415  WBC 4.8 4.3 4.2 5.8 7.6  NEUTROABS 3.2  --   --   --   --   HGB 8.3* 8.3* 8.4* 8.8* 8.0*  HCT 27.7* 26.0* 27.4* 29.0* 26.4*  MCV 81.5 78.8* 79.0* 79.2* 79.3*  PLT 253 214 200 204 413   Basic Metabolic Panel: Recent Labs  Lab 08/22/20 0042 08/23/20 0755 08/24/20 0411 08/25/20 0530 08/26/20 0415  NA 139 136 136 134* 135  K 3.7 3.6 4.0 4.0 4.1  CL 102 102 104 104 104  CO2 26 26 25 23 23   GLUCOSE 120* 137* 169* 218* 170*  BUN 7 8 8 12 14   CREATININE 0.71 0.72 0.69 0.70 0.69  CALCIUM 7.9* 7.7* 8.2* 8.4* 8.4*  MG 1.8 2.1 1.7 1.8 1.9  PHOS 3.6 2.8 2.9 3.5 3.8   GFR: Estimated Creatinine Clearance: 109.7 mL/min (by C-G formula based on SCr of 0.69 mg/dL). Liver Function Tests: Recent Labs  Lab 08/20/20 0624 08/20/20 0624 08/21/20 0535 08/22/20 0042 08/23/20 0755 08/24/20 0411 08/26/20 0415  AST 26  --   --  27 22 22 24   ALT 20  --   --  22 20 21 23   ALKPHOS 76  --   --  76 75 74 71  BILITOT 0.7  --   --  0.8 0.7 0.6 0.4  PROT 5.4*  --   --  5.4* 5.2* 5.6* 5.6*  ALBUMIN 2.5*   < > 2.3* 2.4* 2.4* 2.5* 2.6*   < > = values in this interval not displayed.   No results for input(s): LIPASE, AMYLASE in the last 168 hours. No results for input(s): AMMONIA in the last 168 hours. Coagulation Profile: No results for input(s): INR, PROTIME in the last 168 hours. Cardiac Enzymes: No results for input(s): CKTOTAL, CKMB, CKMBINDEX, TROPONINI in the last 168 hours. BNP (last 3 results) No results for input(s): PROBNP in the last 8760 hours. HbA1C: No results for input(s): HGBA1C in the last 72 hours. CBG: Recent Labs  Lab  08/25/20 1349 08/25/20 1751 08/25/20 2345 08/26/20 0653 08/26/20 1125  GLUCAP 175* 164* 160* 182* 228*   Lipid Profile: No results for input(s): CHOL, HDL, LDLCALC, TRIG, CHOLHDL, LDLDIRECT in the last 72 hours. Thyroid Function Tests: No results for input(s): TSH, T4TOTAL, FREET4, T3FREE, THYROIDAB in the last 72 hours. Anemia Panel: No results for input(s): VITAMINB12, FOLATE, FERRITIN, TIBC, IRON, RETICCTPCT in the last 72 hours. Sepsis Labs: No results for input(s): PROCALCITON, LATICACIDVEN in the last 168 hours.  No results found for this or any previous visit (  from the past 240 hour(s)).    Radiology Studies: No results found.    Scheduled Meds: . Chlorhexidine Gluconate Cloth  6 each Topical Daily  . insulin aspart  0-15 Units Subcutaneous Q6H  . insulin glargine  7 Units Subcutaneous QHS  . metoCLOPramide (REGLAN) injection  5 mg Intravenous Q8H  . metoprolol tartrate  7.5 mg Intravenous Q6H  . sodium chloride flush  10-40 mL Intracatheter Q12H   Continuous Infusions: . dextrose 5 % and 0.9 % NaCl with KCl 40 mEq/L 10 mL/hr at 08/25/20 1800  . heparin 1,300 Units/hr (08/25/20 0853)  . magnesium sulfate bolus IVPB    . TPN ADULT (ION) 90 mL/hr at 08/25/20 1800  . TPN ADULT (ION)       LOS: 14 days      Time spent: 25 minutes   Dessa Phi, DO [facility] 08/26/2020, 12:11 PM   Available via Epic secure chat 7am-7pm After these hours, please refer to coverage provider listed on [email or web address]

## 2020-08-26 NOTE — Progress Notes (Addendum)
PHARMACY - TOTAL PARENTERAL NUTRITION CONSULT NOTE   Indication: Prolonged ileus  Patient Measurements: Height: 5\' 5"  (165.1 cm) Weight: 105.3 kg (232 lb 2.3 oz) IBW/kg (Calculated) : 61.5 TPN AdjBW (KG): 72.7 Body mass index is 38.63 kg/m.  Assessment:  59 yo male with PMH gout, OA, DM2, anemia admitted with upper extremity DVT and development of colonic ileus. GI following, recommend NGT to LIWS, s/p neostigmine 10/1, no surgical indications at this point unless he perforates or has ischemia, have signed off 10/4.   Glucose / Insulin: Hx DM, A1c 7.18 Jun 2020. CBGs 160-184 last 24hr on Lantus 7 units qHS and 10 units insulin in TPN added on 10/8 PM - 12 units of insulin given yesterday  Electrolytes: magnesium 1.9 after 2g mag sulfate IV x 1 yesterday, others WNL, CorrCa WNL   - goal K >/= 4.0, Mg >/= 2.0 with ileus Renal: Hx CKD III. SCr WNL LFTs / TGs: LFTs WNL/TG 94 (10/5) Prealbumin / albumin: PAB 6.5/Alb 2.4 (10/5) Intake / Output; MIVF: I/O -1567 ml with 3.75L UOP; NGT pulled 10/8 per notes; D5NS KCl 14mEq/L at [facility] on IV reglan q8h GI Imaging: - 9/27 CT abd/pelvis: findings consistent with colonic ileus, pneumatosis of the ascending and hepatic flexure of the colon. - 10/3 AXR:  stable diffuse colonic dilatation is noted most consistent with colonic ileus/adynamic ileus with progression and colonic pneumatosis. Surgeries / Procedures:   Central access: plan PICC TPN start date: plan 10/5  Nutritional Goals (per RD recommendation on 10/6):  Kcal:  1850-2050  Protein:  95-110g  Fluid:  2L/day  Goal TPN rate is 90 mL/hr (provides 108 g of protein and 2181 kcals per day)  Current Nutrition:  NPO - diet advanced today 10/8  Plan:  Repeat magnesium sulfate 2 g IV x 1 this AM   Continue TPN at 90 mL/hr at 1800, target rate   Electrolytes in TPN: 20mEq/L of Na, 48mEq/L of K, 69mEq/L of Ca, 16mEq/L of Mg, and 37mmol/L of Phos. Cl:Ac ratio 1:1  Standard MVI  and trace elements to TPN  Change SSI to moderate scale and change SSI from q8 to q6   Increase regular insulin in TPN from 10 units to 15 units, continue Lantus 7 units qhs  Continue MIVF to KVO at 1800  Monitor TPN labs on Mon/Thurs  CMET/Mg/Phos with AM labs    Adrian Saran, PharmD, BCPS 08/26/2020 10:06 AM

## 2020-08-27 DIAGNOSIS — K567 Ileus, unspecified: Secondary | ICD-10-CM | POA: Diagnosis not present

## 2020-08-27 LAB — BASIC METABOLIC PANEL
Anion gap: 10 (ref 5–15)
BUN: 17 mg/dL (ref 6–20)
CO2: 23 mmol/L (ref 22–32)
Calcium: 8.5 mg/dL — ABNORMAL LOW (ref 8.9–10.3)
Chloride: 103 mmol/L (ref 98–111)
Creatinine, Ser: 0.76 mg/dL (ref 0.61–1.24)
GFR, Estimated: >60 mL/min (ref >60)
Glucose, Bld: 192 mg/dL — ABNORMAL HIGH (ref 70–99)
Potassium: 3.9 mmol/L (ref 3.5–5.1)
Sodium: 136 mmol/L (ref 135–145)

## 2020-08-27 LAB — CBC
HCT: 25.7 % — ABNORMAL LOW (ref 39.0–52.0)
Hemoglobin: 7.9 g/dL — ABNORMAL LOW (ref 13.0–17.0)
MCH: 24.6 pg — ABNORMAL LOW (ref 26.0–34.0)
MCHC: 30.7 g/dL (ref 30.0–36.0)
MCV: 80.1 fL (ref 80.0–100.0)
Platelets: 165 10*3/uL (ref 150–400)
RBC: 3.21 MIL/uL — ABNORMAL LOW (ref 4.22–5.81)
RDW: 22.8 % — ABNORMAL HIGH (ref 11.5–15.5)
WBC: 8.3 10*3/uL (ref 4.0–10.5)
nRBC: 0 % (ref 0.0–0.2)

## 2020-08-27 LAB — GLUCOSE, CAPILLARY
Glucose-Capillary: 171 mg/dL — ABNORMAL HIGH (ref 70–99)
Glucose-Capillary: 172 mg/dL — ABNORMAL HIGH (ref 70–99)
Glucose-Capillary: 177 mg/dL — ABNORMAL HIGH (ref 70–99)
Glucose-Capillary: 207 mg/dL — ABNORMAL HIGH (ref 70–99)

## 2020-08-27 LAB — MAGNESIUM: Magnesium: 2.1 mg/dL (ref 1.7–2.4)

## 2020-08-27 LAB — HEPARIN LEVEL (UNFRACTIONATED): Heparin Unfractionated: 0.43 IU/mL (ref 0.30–0.70)

## 2020-08-27 MED ORDER — POTASSIUM CHLORIDE 10 MEQ/100ML IV SOLN
10.0000 meq | INTRAVENOUS | Status: AC
  Administered 2020-08-27 (×2): 10 meq via INTRAVENOUS
  Filled 2020-08-27 (×2): qty 100

## 2020-08-27 MED ORDER — TRAVASOL 10 % IV SOLN
INTRAVENOUS | Status: AC
  Filled 2020-08-27: qty 1080

## 2020-08-27 NOTE — Progress Notes (Signed)
PHARMACY - TOTAL PARENTERAL NUTRITION CONSULT NOTE   Indication: Prolonged ileus  Patient Measurements: Height: 5\' 5"  (165.1 cm) Weight: 105.3 kg (232 lb 2.3 oz) IBW/kg (Calculated) : 61.5 TPN AdjBW (KG): 72.7 Body mass index is 38.63 kg/m.  Assessment:  59 yo male with PMH gout, OA, DM2, anemia admitted with upper extremity DVT and development of colonic ileus. GI following, recommend NGT to LIWS, s/p neostigmine 10/1, no surgical indications at this point unless he perforates or has ischemia, have signed off 10/4.   Glucose / Insulin: Hx DM, A1c 7.18 Jun 2020. CBGs 207 and 171 since TPN with increased 15 units of insulin in bag started and on Lantus 7 units qHS - 21 units of insulin given yesterday  Electrolytes: WNL - K just below 4.0 at 3.9 - goal K >/= 4.0, Mg >/= 2.0 with ileus Renal: Hx CKD III. SCr WNL LFTs / TGs: LFTs WNL/TG 94 (10/5) Prealbumin / albumin: PAB 6.5/Alb 2.4 (10/5) Intake / Output; MIVF: I/O -1567 ml with 3.75L UOP; NGT pulled 10/8 per notes; D5NS KCl 64mEq/L at [facility] on IV reglan q8h GI Imaging: - 9/27 CT abd/pelvis: findings consistent with colonic ileus, pneumatosis of the ascending and hepatic flexure of the colon. - 10/3 AXR:  stable diffuse colonic dilatation is noted most consistent with colonic ileus/adynamic ileus with progression and colonic pneumatosis. Surgeries / Procedures:   Central access: PICC TPN start date: 10/5  Nutritional Goals (per RD recommendation on 10/6):  Kcal:  1850-2050  Protein:  95-110g  Fluid:  2L/day  Goal TPN rate is 90 mL/hr (provides 108 g of protein and 2181 kcals per day)  Current Nutrition:  CLD  Plan:  KCL 26mEq IV x 2 runs   Continue TPN at 90 mL/hr at 1800, target rate   Electrolytes in TPN: 14mEq/L of Na, 27mEq/L of K, 37mEq/L of Ca, 49mEq/L of Mg, and 54mmol/L of Phos. Cl:Ac ratio 1:1  Standard MVI and trace elements to TPN  Change SSI to moderate scale and change SSI from q8 to q6    Increase regular insulin in TPN from 15 units to 20 units, continue Lantus 7 units qhs  Continue MIVF to KVO at 1800  Monitor TPN labs on Mon/Thurs  CMET/Mg/Phos with AM labs    Adrian Saran, PharmD, BCPS 08/27/2020 10:16 AM

## 2020-08-27 NOTE — Progress Notes (Signed)
PROGRESS NOTE    [name]  YFV:[id number] DOB: 12-13-1960 DOA: 08/10/2020 PCP: Charlott Rakes, MD     Brief Narrative:  [name] is a 59 yo male with past medical history significant of type 2 diabetes, chronic gout who was recently hospitalized 8/11-8/24 with AKI, at that time found to have left upper extremity DVT in addition to polyarticular gout.  He now comes in with sepsis, UTI, bowel obstruction.  New events last 24 hours / Subjective: Fever yesterday and work up revealed UTI. Frustrated that he is not getting better. Had another BM, tolerating clear liquid diet.  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  Currently on IV heparin due to above   CKD stage II -Stable  Sepsis secondary to E. coli UTI, present on admission -Sepsis ruled in -Completed antibiotics 10/2  -Fever 10/9 and UA indicative of UTI - started rocephin 10/9 -Blood culture pending  -Urine culture pending   Iron deficiency anemia -Status  post IV Feraheme 9/25    DVT prophylaxis: IV heparin   Code Status: Full code Family Communication: No family at bedside Disposition Plan:  Status is: Inpatient  Remains inpatient appropriate because:IV treatments appropriate due to intensity of illness or inability to take PO   Dispo:  Patient From:  Home  Planned Disposition: [place]  Expected discharge date: 2-3 days  Medically stable for discharge:   Not medically stable for discharge.  He remains on TPN    Antimicrobials:  Anti-infectives (From admission, onward)   Start     Dose/Rate Route Frequency Ordered Stop   08/26/20 2300  cefTRIAXone (ROCEPHIN) 1 g in sodium chloride 0.9 % 100 mL IVPB        1 g 200 mL/hr over 30 Minutes Intravenous Every 24 hours 08/26/20 2209     08/15/20 1600  ceFAZolin (ANCEF) IVPB 1 g/50 mL premix        1 g 100 mL/hr over 30 Minutes Intravenous Every 8 hours 08/15/20 1429 08/19/20 2243   08/14/20 1730  cefTRIAXone (ROCEPHIN) 1 g in sodium chloride 0.9 % 100 mL IVPB  Status:  Discontinued        1 g 200 mL/hr over 30 Minutes Intravenous Every 24 hours 08/14/20 1647 08/15/20 1429   08/12/20 1230  cephALEXin (KEFLEX) capsule 500 mg  Status:  Discontinued        500 mg Oral Every 12 hours 08/12/20 1139 08/14/20 1647       Objective: Vitals:   08/27/20 0400 08/27/20 0500 08/27/20 0600 08/27/20 0700  BP: 126/89  137/79 (!) 148/87  Pulse: 87 (!) 123 79 79  Resp: 18 15 16 16   Temp:    98.9 F (37.2 C)  TempSrc:    Oral  SpO2: 98% 100% 100% 100%  Weight:      Height:        Intake/Output Summary (Last 24 hours) at 08/27/2020 1205 Last data filed at 08/27/2020 0820 Gross per 24 hour  Intake 1373.16 ml  Output 1475 ml  Net -101.84 ml   Filed Weights   08/20/20 0600 08/21/20 0500 08/22/20 0509  Weight: 98.7 kg 108.4 kg 105.3 kg    Examination: General exam: Appears calm and comfortable  Respiratory system: Clear to auscultation. Respiratory effort  normal. Cardiovascular system: S1 & S2 heard, RRR. No pedal edema. Gastrointestinal system: Abdomen is mildly distended, soft and nontender. Normal bowel sounds heard. Central nervous system: Alert and oriented. Non focal exam. Speech clear  Extremities: Symmetric in appearance bilaterally  Skin: No rashes, lesions or ulcers on exposed skin  Psychiatry: Judgement and insight appear stable. Mood & affect appropriate.    Data Reviewed: I have personally reviewed following labs and imaging studies  CBC: Recent Labs  Lab 08/22/20 0042 08/22/20 0042 08/23/20 0755 08/24/20 0411 08/25/20 0530 08/26/20 0415 08/27/20 0311  WBC 4.8   < > 4.3 4.2 5.8 7.6 8.3  NEUTROABS 3.2  --   --   --   --   --   --   HGB 8.3*   < > 8.3* 8.4* 8.8* 8.0* 7.9*  HCT 27.7*   < > 26.0* 27.4* 29.0* 26.4* 25.7*  MCV 81.5   < > 78.8* 79.0* 79.2* 79.3* 80.1  PLT 253   < > 214 200 204 183 165   < > = values in this interval not displayed.   Basic Metabolic Panel: Recent Labs  Lab 08/22/20 0042 08/22/20 0042 08/23/20 0755 08/24/20 0411 08/25/20 0530 08/26/20 0415 08/27/20 0311  NA 139   < > 136 136 134* 135 136  K 3.7   < > 3.6 4.0 4.0 4.1 3.9  CL 102   < > 102 104 104 104 103  CO2 26   < > 26 25 23 23 23   GLUCOSE 120*   < > 137* 169* 218* 170* 192*  BUN 7   < > 8 8 12 14 17   CREATININE 0.71   < > 0.72 0.69 0.70 0.69 0.76  CALCIUM 7.9*   < > 7.7* 8.2* 8.4* 8.4* 8.5*  MG 1.8   < > 2.1 1.7 1.8 1.9 2.1  PHOS 3.6  --  2.8 2.9 3.5 3.8  --    < > = values in this interval not displayed.   GFR: Estimated Creatinine Clearance: 109.7 mL/min (by C-G formula based on SCr of 0.76 mg/dL). Liver Function Tests: Recent Labs  Lab 08/21/20 0535 08/22/20 0042 08/23/20 0755 08/24/20 0411 08/26/20 0415  AST  --  27 22 22 24   ALT  --  22 20 21 23   ALKPHOS  --  76 75 74 71  BILITOT  --  0.8 0.7 0.6 0.4  PROT  --  5.4* 5.2* 5.6* 5.6*  ALBUMIN 2.3* 2.4* 2.4* 2.5* 2.6*   No results for input(s): LIPASE, AMYLASE  in the last 168 hours. No results for input(s): AMMONIA in the last 168 hours. Coagulation Profile: No results for input(s): INR, PROTIME in the last 168 hours. Cardiac Enzymes: No results for input(s): CKTOTAL, CKMB, CKMBINDEX, TROPONINI in the last  168 hours. BNP (last 3 results) No results for input(s): PROBNP in the last 8760 hours. HbA1C: No results for input(s): HGBA1C in the last 72 hours. CBG: Recent Labs  Lab 08/26/20 1125 08/26/20 1732 08/27/20 0023 08/27/20 0518 08/27/20 1105  GLUCAP 228* 214* 207* 171* 172*   Lipid Profile: No results for input(s): CHOL, HDL, LDLCALC, TRIG, CHOLHDL, LDLDIRECT in the last 72 hours. Thyroid Function Tests: No results for input(s): TSH, T4TOTAL, FREET4, T3FREE, THYROIDAB in the last 72 hours. Anemia Panel: No results for input(s): VITAMINB12, FOLATE, FERRITIN, TIBC, IRON, RETICCTPCT in the last 72 hours. Sepsis Labs: No results for input(s): PROCALCITON, LATICACIDVEN in the last 168 hours.  No results found for this or any previous visit (from the past 240 hour(s)).    Radiology Studies: DG Abd 1 View  Result Date: 08/26/2020 CLINICAL DATA:  Distension EXAM: ABDOMEN - 1 VIEW COMPARISON:  August 22, 2020 FINDINGS: There is unchanged gaseous dilation of loops of large and small bowel. Enteric tube is been removed. Visualized lung bases are unremarkable. Degenerative changes of the lower lumbar spine. IMPRESSION: Unchanged gaseous dilation of loops of large and small bowel. Electronically Signed   By: Valentino Saxon MD   On: 08/26/2020 16:04   DG CHEST PORT 1 VIEW  Result Date: 08/26/2020 CLINICAL DATA:  Fever EXAM: PORTABLE CHEST 1 VIEW COMPARISON:  August 10, 2020. FINDINGS: The cardiomediastinal silhouette is unchanged in contour.RIGHT upper extremity PICC tip terminates over the SVC. No pleural effusion. No pneumothorax. No acute pleuroparenchymal abnormality. Partial visualization of gaseous dilation of loops of bowel. No  acute osseous abnormality. IMPRESSION: 1. No acute cardiopulmonary abnormality. 2. Partial visualization of gaseous dilation of loops of bowel. Electronically Signed   By: Valentino Saxon MD   On: 08/26/2020 16:03      Scheduled Meds:  Chlorhexidine Gluconate Cloth  6 each Topical Daily   insulin aspart  0-15 Units Subcutaneous Q6H   insulin glargine  7 Units Subcutaneous QHS   metoCLOPramide (REGLAN) injection  5 mg Intravenous Q8H   metoprolol tartrate  7.5 mg Intravenous Q6H   polyethylene glycol  17 g Oral Daily   sodium chloride flush  10-40 mL Intracatheter Q12H   Continuous Infusions:  cefTRIAXone (ROCEPHIN)  IV 1 g (08/26/20 2348)   dextrose 5 % and 0.9 % NaCl with KCl 40 mEq/L 10 mL/hr at 08/25/20 1800   heparin 1,300 Units/hr (08/25/20 0853)   potassium chloride 10 mEq (08/27/20 1106)   TPN ADULT (ION) 90 mL/hr at 08/26/20 1722   TPN ADULT (ION)       LOS: 15 days      Time spent: 25 minutes   Dessa Phi, DO [facility] 08/27/2020, 12:05 PM   Available via Epic secure chat 7am-7pm After these hours, please refer to coverage provider listed on [email or web address]

## 2020-08-27 NOTE — Progress Notes (Signed)
° °   Progress Note Weekend coverage for Dr. Benson Norway  Subjective  Chief Complaint: Ileus and malnutrition  Continues to have bowel movements this morning, tells me he had 1 bowel movement today.  Tells me there is been no real change since addition of MiraLAX overnight.  No abdominal pain, ambulating when able.    Objective   Vital signs in last 24 hours: Temp:  [98.1 F (36.7 C)-100.3 F (37.9 C)] 98.1 F (36.7 C) (10/10 1300) Pulse Rate:  [40-123] 84 (10/10 1300) Resp:  [13-25] 13 (10/10 1300) BP: (103-164)/(64-99) 164/99 (10/10 1300) SpO2:  [95 %-100 %] 100 % (10/10 1300) Last BM Date: 08/26/20 General:    AA male in NAD Heart:  Regular rate and rhythm; no murmurs Lungs: Respirations even and unlabored, lungs CTA bilaterally Abdomen:  Soft, nontender and moderate distension, tympanic. Normal bowel sounds. Extremities:  Without edema. Neurologic:  Alert and oriented,  grossly normal neurologically. Psych:  Cooperative. Normal mood and affect.  Intake/Output from previous day: 10/09 0701 - 10/10 0700 In: 3013.3 [P.O.:480; I.V.:2533.3] Out: 1675 [Urine:1675] Intake/Output this shift: Total I/O In: 1164.1 [P.O.:240; I.V.:724.1; IV Piggyback:200] Out: 300 [Urine:300]  Lab Results: Recent Labs    08/25/20 0530 08/26/20 0415 08/27/20 0311  WBC 5.8 7.6 8.3  HGB 8.8* 8.0* 7.9*  HCT 29.0* 26.4* 25.7*  PLT 204 183 165   BMET Recent Labs    08/25/20 0530 08/26/20 0415 08/27/20 0311  NA 134* 135 136  K 4.0 4.1 3.9  CL 104 104 103  CO2 23 23 23   GLUCOSE 218* 170* 192*  BUN 12 14 17   CREATININE 0.70 0.69 0.76  CALCIUM 8.4* 8.4* 8.5*   LFT Recent Labs    08/26/20 0415  PROT 5.6*  ALBUMIN 2.6*  AST 24  ALT 23  ALKPHOS 71  BILITOT 0.4   Studies/Results: DG Abd 1 View  Result Date: 08/26/2020 CLINICAL DATA:  Distension EXAM: ABDOMEN - 1 VIEW COMPARISON:  August 22, 2020 FINDINGS: There is unchanged gaseous dilation of loops of large and small bowel. Enteric  tube is been removed. Visualized lung bases are unremarkable. Degenerative changes of the lower lumbar spine. IMPRESSION: Unchanged gaseous dilation of loops of large and small bowel. Electronically Signed   By: Valentino Saxon MD   On: 08/26/2020 16:04   DG CHEST PORT 1 VIEW  Result Date: 08/26/2020 CLINICAL DATA:  Fever EXAM: PORTABLE CHEST 1 VIEW COMPARISON:  August 10, 2020. FINDINGS: The cardiomediastinal silhouette is unchanged in contour.RIGHT upper extremity PICC tip terminates over the SVC. No pleural effusion. No pneumothorax. No acute pleuroparenchymal abnormality. Partial visualization of gaseous dilation of loops of bowel. No acute osseous abnormality. IMPRESSION: 1. No acute cardiopulmonary abnormality. 2. Partial visualization of gaseous dilation of loops of bowel. Electronically Signed   By: Valentino Saxon MD   On: 08/26/2020 16:03    Assessment / Plan:   Assessment: 1.  Ileus: NG tube removed 08/26/2019, no further abdominal pain, passing stools and flatus and currently tolerating clear liquid diet 2.  Malnutrition  Plan: 1.  Continue with mobility 2.  Continue clears for now 3.  Continue MiraLAX as ordered yesterday 4.  Please await further recommendations from Dr. Havery Moros later today.  Thank you for kind consultation, Dr. Benson Norway will take over tomorrow.     LOS: 15 days   Levin Erp  08/27/2020, 2:41 PM

## 2020-08-27 NOTE — Plan of Care (Signed)
  Problem: Skin Integrity: Goal: Risk for impaired skin integrity will decrease Outcome: Progressing   Problem: Coping: Goal: Level of anxiety will decrease Outcome: Progressing   Problem: Pain Managment: Goal: General experience of comfort will improve Outcome: Progressing

## 2020-08-27 NOTE — Progress Notes (Signed)
ANTICOAGULATION CONSULT NOTE - Follow Up Consult  Pharmacy Consult for IV heparin (on apixaban PTA) Indication: history of DVT  Allergies  Allergen Reactions  . Hctz [Hydrochlorothiazide]     Hx of gout; uric acid 14.1 on 03/13/20 Acute gout L elbow 8/11-8/24/21    Patient Measurements: Height: 5\' 5"  (165.1 cm) Weight: 105.3 kg (232 lb 2.3 oz) IBW/kg (Calculated) : 61.5 Heparin Dosing Weight: 85.7 kg  Vital Signs: Temp: 98.9 F (37.2 C) (10/10 0700) Temp Source: Oral (10/10 0700) BP: 148/87 (10/10 0700) Pulse Rate: 79 (10/10 0700)  Labs: Recent Labs    08/25/20 0530 08/25/20 0530 08/25/20 1449 08/25/20 2105 08/26/20 0415 08/27/20 0311  HGB 8.8*   < >  --   --  8.0* 7.9*  HCT 29.0*  --   --   --  26.4* 25.7*  PLT 204  --   --   --  183 165  HEPARINUNFRC 0.25*  --    < > 0.43 0.41 0.43  CREATININE 0.70  --   --   --  0.69 0.76   < > = values in this interval not displayed.    Estimated Creatinine Clearance: 109.7 mL/min (by C-G formula based on SCr of 0.76 mg/dL).    Assessment: 89 y/oM with PMH of DVT (07/03/2020) on Apixaban PTA admitted on 08/10/2020 with SIRS. PO meds held d/t colonic ileus. Pharmacy consulted for IV heparin dosing while Apixaban on hold.    Heparin level continues to be therapeutic on current IV heparin rate of 1300 units/hr  CBC: Hgb 7.9, low but stable. Pltc WNL.   No bleeding or infusion issues noted per nursing  Goal of Therapy:  Heparin level 0.3-0.7 units/ml Monitor platelets by anticoagulation protocol: Yes   Plan:   Continue IV heparin at 1300 units/hr  Daily heparin level and CBC  Monitor closely for signs and symptoms of bleeding   Adrian Saran, PharmD, BCPS 08/27/2020 10:15 AM

## 2020-08-28 DIAGNOSIS — K567 Ileus, unspecified: Secondary | ICD-10-CM | POA: Diagnosis not present

## 2020-08-28 LAB — DIFFERENTIAL
Abs Immature Granulocytes: 0.24 10*3/uL — ABNORMAL HIGH (ref 0.00–0.07)
Basophils Absolute: 0 10*3/uL (ref 0.0–0.1)
Basophils Relative: 0 %
Eosinophils Absolute: 0.1 10*3/uL (ref 0.0–0.5)
Eosinophils Relative: 1 %
Immature Granulocytes: 3 %
Lymphocytes Relative: 20 %
Lymphs Abs: 1.5 10*3/uL (ref 0.7–4.0)
Monocytes Absolute: 1.1 10*3/uL — ABNORMAL HIGH (ref 0.1–1.0)
Monocytes Relative: 14 %
Neutro Abs: 4.7 10*3/uL (ref 1.7–7.7)
Neutrophils Relative %: 62 %

## 2020-08-28 LAB — CBC
HCT: 25 % — ABNORMAL LOW (ref 39.0–52.0)
Hemoglobin: 7.7 g/dL — ABNORMAL LOW (ref 13.0–17.0)
MCH: 24.5 pg — ABNORMAL LOW (ref 26.0–34.0)
MCHC: 30.8 g/dL (ref 30.0–36.0)
MCV: 79.6 fL — ABNORMAL LOW (ref 80.0–100.0)
Platelets: 154 10*3/uL (ref 150–400)
RBC: 3.14 MIL/uL — ABNORMAL LOW (ref 4.22–5.81)
RDW: 22.4 % — ABNORMAL HIGH (ref 11.5–15.5)
WBC: 7.7 10*3/uL (ref 4.0–10.5)
nRBC: 0 % (ref 0.0–0.2)

## 2020-08-28 LAB — GLUCOSE, CAPILLARY
Glucose-Capillary: 210 mg/dL — ABNORMAL HIGH (ref 70–99)
Glucose-Capillary: 216 mg/dL — ABNORMAL HIGH (ref 70–99)
Glucose-Capillary: 226 mg/dL — ABNORMAL HIGH (ref 70–99)
Glucose-Capillary: 241 mg/dL — ABNORMAL HIGH (ref 70–99)
Glucose-Capillary: 244 mg/dL — ABNORMAL HIGH (ref 70–99)

## 2020-08-28 LAB — COMPREHENSIVE METABOLIC PANEL
ALT: 33 U/L (ref 0–44)
AST: 30 U/L (ref 15–41)
Albumin: 2.5 g/dL — ABNORMAL LOW (ref 3.5–5.0)
Alkaline Phosphatase: 84 U/L (ref 38–126)
Anion gap: 8 (ref 5–15)
BUN: 15 mg/dL (ref 6–20)
CO2: 22 mmol/L (ref 22–32)
Calcium: 8.6 mg/dL — ABNORMAL LOW (ref 8.9–10.3)
Chloride: 104 mmol/L (ref 98–111)
Creatinine, Ser: 0.7 mg/dL (ref 0.61–1.24)
GFR, Estimated: >60 mL/min (ref >60)
Glucose, Bld: 197 mg/dL — ABNORMAL HIGH (ref 70–99)
Potassium: 4.3 mmol/L (ref 3.5–5.1)
Sodium: 134 mmol/L — ABNORMAL LOW (ref 135–145)
Total Bilirubin: 0.7 mg/dL (ref 0.3–1.2)
Total Protein: 5.9 g/dL — ABNORMAL LOW (ref 6.5–8.1)

## 2020-08-28 LAB — PREALBUMIN: Prealbumin: 8.1 mg/dL — ABNORMAL LOW (ref 18–38)

## 2020-08-28 LAB — HEPARIN LEVEL (UNFRACTIONATED)
Heparin Unfractionated: 0.21 IU/mL — ABNORMAL LOW (ref 0.30–0.70)
Heparin Unfractionated: 0.48 IU/mL (ref 0.30–0.70)

## 2020-08-28 LAB — TRIGLYCERIDES: Triglycerides: 109 mg/dL (ref <150)

## 2020-08-28 LAB — MAGNESIUM: Magnesium: 1.8 mg/dL (ref 1.7–2.4)

## 2020-08-28 LAB — PHOSPHORUS: Phosphorus: 3.5 mg/dL (ref 2.5–4.6)

## 2020-08-28 MED ORDER — MAGNESIUM SULFATE 2 GM/50ML IV SOLN
2.0000 g | Freq: Once | INTRAVENOUS | Status: AC
  Administered 2020-08-28: 2 g via INTRAVENOUS
  Filled 2020-08-28: qty 50

## 2020-08-28 MED ORDER — TRAVASOL 10 % IV SOLN
INTRAVENOUS | Status: AC
  Filled 2020-08-28: qty 1080

## 2020-08-28 MED ORDER — HEPARIN (PORCINE) 25000 UT/250ML-% IV SOLN
1400.0000 [IU]/h | INTRAVENOUS | Status: DC
  Administered 2020-08-28 – 2020-08-30 (×3): 1400 [IU]/h via INTRAVENOUS
  Filled 2020-08-28 (×3): qty 250

## 2020-08-28 NOTE — Progress Notes (Signed)
Physical Therapy Treatment Patient Details Name: [name] MRN: [id number] DOB: 1961-11-04 Today's Date: 08/28/2020    History of Present Illness 59 yo male admitted to [facility] on 9/24 with weakness, joint pain, difficulty urinating. Pt meeting SIRS criteria, possibly secondary to urinary infection. NGT placed 10/1 for ileus. Now clamped. PMHx significant for chronic gouty arthritis of multiple joints, CKD II, IDDM, HTN, HLD, and chronic anemia. Admitted to [facility] 8/11-8/24 for AKI, LUE DVT, and joint pain, d/c SNF.  General transfer comment: required increased assist this session to rise from recliner level General Gait Details: used B Platform EVA walker for increased support.  NT assisted.  Pt tolerated an increased distance this session.  BP remains elevated and HR started at 95 and increased to 144.  PT Comments    Pt will need STY Rehab at SNF  Follow Up Recommendations  SNF     Equipment Recommendations       Recommendations for Other Services       Precautions / Restrictions Precautions Precautions: Fall Precaution Comments: NG tube D/C now on full liquids    Mobility  Bed Mobility               General bed mobility comments: OOB in recliner  Transfers Overall transfer level: Needs assistance Equipment used: Bilateral platform walker Transfers: Sit to/from Stand Sit to Stand: Mod assist;+2 physical assistance;+2 safety/equipment         General transfer comment: required increased assist this session to rise from recliner level  Ambulation/Gait Ambulation/Gait assistance: Mod assist;+2 physical assistance Gait Distance (Feet): 47 Feet Assistive device: Bilateral platform walker Gait Pattern/deviations: Step-to pattern;Decreased step length - left;Decreased step length - right Gait velocity: decreased   General Gait Details: used B Platform EVA walker for increased support.  NT assisted.  Pt tolerated an increased distance this session.  BP remains  elevated and HR started at 95 and increased to 144.   Stairs             Wheelchair Mobility    Modified Rankin (Stroke Patients Only)       Balance                                            Cognition Arousal/Alertness: Awake/alert Behavior During Therapy: WFL for tasks assessed/performed                                   General Comments: Alert and pleasant.   quiet      Exercises      General Comments        Pertinent Vitals/Pain Pain Assessment: No/denies pain    Home Living                      Prior Function            PT Goals (current goals can now be found in the care plan section) Progress towards PT goals: Progressing toward goals    Frequency    Min 2X/week      PT Plan Current plan remains appropriate    Co-evaluation              AM-PAC PT "6 Clicks" Mobility   Outcome Measure  Help needed turning from your back to your side  while in a flat bed without using bedrails?: A Lot Help needed moving from lying on your back to sitting on the side of a flat bed without using bedrails?: A Lot Help needed moving to and from a bed to a chair (including a wheelchair)?: A Lot Help needed standing up from a chair using your arms (e.g., wheelchair or bedside chair)?: A Lot Help needed to walk in hospital room?: A Lot Help needed climbing 3-5 steps with a railing? : Total 6 Click Score: 11    End of Session Equipment Utilized During Treatment: Gait belt Activity Tolerance: Patient limited by fatigue Patient left: in chair;with chair alarm set Nurse Communication: Mobility status PT Visit Diagnosis: Muscle weakness (generalized) (M62.81);Other abnormalities of gait and mobility (R26.89)     Time: 1420-1445 PT Time Calculation (min) (ACUTE ONLY): 25 min  Charges:  $Gait Training: 8-22 mins $Therapeutic Activity: 8-22 mins                     {Kindall Swaby  PTA Acute  Rehabilitation  Services Pager      [phone] Office      [phone]

## 2020-08-28 NOTE — Progress Notes (Signed)
ANTICOAGULATION CONSULT NOTE - Follow Up Consult  Pharmacy Consult for IV heparin (on apixaban PTA) Indication: history of DVT  Allergies  Allergen Reactions  . Hctz [Hydrochlorothiazide]     Hx of gout; uric acid 14.1 on 03/13/20 Acute gout L elbow 8/11-8/24/21    Patient Measurements: Height: 5\' 5"  (165.1 cm) Weight: 105.3 kg (232 lb 2.3 oz) IBW/kg (Calculated) : 61.5 Heparin Dosing Weight: 85.7 kg  Vital Signs: Temp: 98.3 F (36.8 C) (10/11 1524) Temp Source: Oral (10/11 1524) BP: 164/97 (10/11 1245) Pulse Rate: 88 (10/11 1245)  Labs: Recent Labs    08/26/20 0415 08/26/20 0415 08/27/20 0311 08/28/20 0400 08/28/20 1450  HGB 8.0*   < > 7.9* 7.7*  --   HCT 26.4*  --  25.7* 25.0*  --   PLT 183  --  165 154  --   HEPARINUNFRC 0.41   < > 0.43 0.21* 0.48  CREATININE 0.69  --  0.76 0.70  --    < > = values in this interval not displayed.    Estimated Creatinine Clearance: 109.7 mL/min (by C-G formula based on SCr of 0.7 mg/dL).    Assessment: 47 y/oM with PMH of DVT (07/03/2020) on Apixaban PTA admitted on 08/10/2020 with SIRS. PO meds held d/t colonic ileus. Pharmacy consulted for IV heparin dosing while Apixaban on hold.   08/28/20 3:52 PM   Heparin level now therapeutic again after increasing rate slightly to 14 ml/hr  CBC: Hgb 7.7, low but stable. Pltc WNL.   No bleeding or infusion issues documented  Goal of Therapy:  Heparin level 0.3-0.7 units/ml Monitor platelets by anticoagulation protocol: Yes   Plan:   Continue heparin infusion at 1400 units/hr  Daily heparin level and CBC  Monitor closely for signs and symptoms of bleeding  Ulice Dash, PharmD, BCPS [phone] 08/28/2020 3:52 PM

## 2020-08-28 NOTE — TOC Progression Note (Signed)
Transition of Care [facility]) - Progression Note    Patient Details  Name: [name] MRN: [id number] Date of Birth: Mar 19, 1961  [facility] CM/SW Contact  Joaquin Courts, RN Phone Number: 08/28/2020, 2:00 PM  Clinical Narrative:   CM spoke with [facility] rep Claiborne Billings, patient may be medically ready for dc as early as Thursday of this week.  [place] to initiate insurance authorization.  Patient will need an updated Covid test prior to dc.  PT notified that patient will need to be seen again for updated progress notes for authorization process.      Expected Discharge Plan: [place] Barriers to Discharge: Continued Medical Work up  Expected Discharge Plan and Services Expected Discharge Plan: [place]   Discharge Planning Services: CM Consult Post Acute Care Choice: [place] Living arrangements for the past 2 months: Single Family Home                                       Social Determinants of Health (SDOH) Interventions    Readmission Risk Interventions No flowsheet data found.

## 2020-08-28 NOTE — Progress Notes (Signed)
PROGRESS NOTE    [name]  YFV:[id number] DOB: 23-Apr-1961 DOA: 08/10/2020 PCP: Charlott Rakes, MD     Brief Narrative:  [name] is a 59 yo male with past medical history significant of type 2 diabetes, chronic gout who was recently hospitalized 8/11-8/24 with AKI, at that time found to have left upper extremity DVT in addition to polyarticular gout.  He now comes in with sepsis, UTI, bowel obstruction.  New events last 24 hours / Subjective: No new complaints, no abdominal pain, no nausea, had BM. Has been afebrile.   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  Currently on IV heparin due to above   CKD stage II -Stable  Sepsis secondary to E. coli UTI, present on admission -Sepsis ruled in -Completed antibiotics 10/2  -Fever 10/9 and UA indicative of UTI - started rocephin 10/9 -Blood culture 10/9 NGTD  -Urine culture showed GNR, awaiting identification and sensitivity   Iron deficiency anemia -Status post IV Feraheme  9/25    DVT prophylaxis: IV heparin   Code Status: Full code Family Communication: No family at bedside Disposition Plan:  Status is: Inpatient  Remains inpatient appropriate because:IV treatments appropriate due to intensity of illness or inability to take PO   Dispo:  Patient From:  Home  Planned Disposition: [facility]  Expected discharge date: 2-3 days  Medically stable for discharge:   Not medically stable for discharge.  He remains on TPN    Antimicrobials:  Anti-infectives (From admission, onward)   Start     Dose/Rate Route Frequency Ordered Stop   08/26/20 2300  cefTRIAXone (ROCEPHIN) 1 g in sodium chloride 0.9 % 100 mL IVPB        1 g 200 mL/hr over 30 Minutes Intravenous Every 24 hours 08/26/20 2209     08/15/20 1600  ceFAZolin (ANCEF) IVPB 1 g/50 mL premix        1 g 100 mL/hr over 30 Minutes Intravenous Every 8 hours 08/15/20 1429 08/19/20 2243   08/14/20 1730  cefTRIAXone (ROCEPHIN) 1 g in sodium chloride 0.9 % 100 mL IVPB  Status:  Discontinued        1 g 200 mL/hr over 30 Minutes Intravenous Every 24 hours 08/14/20 1647 08/15/20 1429   08/12/20 1230  cephALEXin (KEFLEX) capsule 500 mg  Status:  Discontinued        500 mg Oral Every 12 hours 08/12/20 1139 08/14/20 1647       Objective: Vitals:   08/27/20 1300 08/27/20 1600 08/27/20 2200 08/28/20 0500  BP: (!) 164/99 (!) 155/107 Marland Kitchen)  160/80 (!) 150/82  Pulse: 84 85 74 88  Resp: 13 19 17  (!) 21  Temp: 98.1 F (36.7 C) 98.5 F (36.9 C) 98 F (36.7 C) 98.2 F (36.8 C)  TempSrc: Oral Oral Oral Oral  SpO2: 100% 100%    Weight:      Height:        Intake/Output Summary (Last 24 hours) at 08/28/2020 0949 Last data filed at 08/28/2020 0514 Gross per 24 hour  Intake 1301.45 ml  Output 2250 ml  Net -948.55 ml   Filed Weights   08/20/20 0600 08/21/20 0500 08/22/20 0509  Weight: 98.7 kg 108.4 kg 105.3 kg    Examination: General exam: Appears calm and comfortable  Respiratory  system: Clear to auscultation. Respiratory effort normal. Cardiovascular system: S1 & S2 heard, RRR. No pedal edema. Gastrointestinal system: Abdomen is mildly distended, soft and nontender. Normal bowel sounds heard. Central nervous system: Alert and oriented. Non focal exam. Speech clear  Extremities: Symmetric in appearance bilaterally  Skin: No rashes, lesions or ulcers on exposed skin  Psychiatry: Judgement and insight appear stable. Mood & affect appropriate.    Data Reviewed: I have personally reviewed following labs and imaging studies  CBC: Recent Labs  Lab 08/22/20 0042 08/23/20 0755 08/24/20 0411 08/25/20 0530 08/26/20 0415 08/27/20 0311 08/28/20 0400  WBC 4.8   < > 4.2 5.8 7.6 8.3 7.7  NEUTROABS 3.2  --   --   --   --   --  4.7  HGB 8.3*   < > 8.4* 8.8* 8.0* 7.9* 7.7*  HCT 27.7*   < > 27.4* 29.0* 26.4* 25.7* 25.0*  MCV 81.5   < > 79.0* 79.2* 79.3* 80.1 79.6*  PLT 253   < > 200 204 183 165 154   < > = values in this interval not displayed.   Basic Metabolic Panel: Recent Labs  Lab 08/23/20 0755 08/23/20 0755 08/24/20 0411 08/25/20 0530 08/26/20 0415 08/27/20 0311 08/28/20 0400  NA 136   < > 136 134* 135 136 134*  K 3.6   < > 4.0 4.0 4.1 3.9 4.3  CL 102   < > 104 104 104 103 104  CO2 26   < > 25 23 23 23 22   GLUCOSE 137*   < > 169* 218* 170* 192* 197*  BUN 8   < > 8 12 14 17 15   CREATININE 0.72   < > 0.69 0.70 0.69 0.76 0.70  CALCIUM 7.7*   < > 8.2* 8.4* 8.4* 8.5* 8.6*  MG 2.1   < > 1.7 1.8 1.9 2.1 1.8  PHOS 2.8  --  2.9 3.5 3.8  --  3.5   < > = values in this interval not displayed.   GFR: Estimated Creatinine Clearance: 109.7 mL/min (by C-G formula based on SCr of 0.7 mg/dL). Liver Function Tests: Recent Labs  Lab 08/22/20 0042 08/23/20 0755 08/24/20 0411 08/26/20 0415 08/28/20 0400  AST 27 22 22 24 30   ALT 22 20 21 23  33  ALKPHOS 76 75 74 71 84  BILITOT 0.8 0.7 0.6 0.4 0.7  PROT 5.4* 5.2* 5.6* 5.6* 5.9*  ALBUMIN 2.4* 2.4* 2.5* 2.6* 2.5*    No results for input(s): LIPASE, AMYLASE in the last 168 hours. No results for input(s): AMMONIA in the last 168 hours. Coagulation Profile: No results for input(s): INR, PROTIME in the last 168 hours. Cardiac Enzymes: No results for input(s): CKTOTAL, CKMB, CKMBINDEX, TROPONINI in the last 168 hours. BNP (  last 3 results) No results for input(s): PROBNP in the last 8760 hours. HbA1C: No results for input(s): HGBA1C in the last 72 hours. CBG: Recent Labs  Lab 08/27/20 0518 08/27/20 1105 08/27/20 1649 08/28/20 0010 08/28/20 0536  GLUCAP 171* 172* 177* 210* 226*   Lipid Profile: Recent Labs    08/28/20 0400  TRIG 109   Thyroid Function Tests: No results for input(s): TSH, T4TOTAL, FREET4, T3FREE, THYROIDAB in the last 72 hours. Anemia Panel: No results for input(s): VITAMINB12, FOLATE, FERRITIN, TIBC, IRON, RETICCTPCT in the last 72 hours. Sepsis Labs: No results for input(s): PROCALCITON, LATICACIDVEN in the last 168 hours.  Recent Results (from the past 240 hour(s))  Culture, blood (routine x 2)     Status: None (Preliminary result)   Collection Time: 08/26/20  2:27 PM   Specimen: BLOOD  Result Value Ref Range Status   Specimen Description   Final    BLOOD LEFT FOOT Performed at [facility] [address], [place], [place] [address]    Special Requests   Final    BOTTLES DRAWN AEROBIC AND ANAEROBIC Blood Culture adequate volume Performed at [facility] [address], [place], [place] [address]    Culture   Final    NO GROWTH 2 DAYS Performed at [facility] [address], [place], [place] [address]    Report Status PENDING  Incomplete  Culture, Urine     Status: Abnormal (Preliminary result)   Collection Time: 08/26/20  5:44 PM   Specimen: Urine, Clean Catch  Result Value Ref Range Status   Specimen Description   Final    URINE, CLEAN CATCH Performed at [facility], [place] [address]  [place], [place] [address]    Special Requests   Final    NONE Performed at [facility], [place] [address] [place], [place] [address]    Culture (A)  Final    >=100,000 COLONIES/mL GRAM NEGATIVE RODS SUSCEPTIBILITIES TO FOLLOW Performed at [facility], [place] [address], [place], [place] [address]    Report Status PENDING  Incomplete      Radiology Studies: DG Abd 1 View  Result Date: 08/26/2020 CLINICAL DATA:  Distension EXAM: ABDOMEN - 1 VIEW COMPARISON:  August 22, 2020 FINDINGS: There is unchanged gaseous dilation of loops of large and small bowel. Enteric tube is been removed. Visualized lung bases are unremarkable. Degenerative changes of the lower lumbar spine. IMPRESSION: Unchanged gaseous dilation of loops of large and small bowel. Electronically Signed   By: Valentino Saxon MD   On: 08/26/2020 16:04   DG CHEST PORT 1 VIEW  Result Date: 08/26/2020 CLINICAL DATA:  Fever EXAM: PORTABLE CHEST 1 VIEW COMPARISON:  August 10, 2020. FINDINGS: The cardiomediastinal silhouette is unchanged in contour.RIGHT upper extremity PICC tip terminates over the SVC. No pleural effusion. No pneumothorax. No acute pleuroparenchymal abnormality. Partial visualization of gaseous dilation of loops of bowel. No acute osseous abnormality. IMPRESSION: 1. No acute cardiopulmonary abnormality. 2. Partial visualization of gaseous dilation of loops of bowel. Electronically Signed   By: Valentino Saxon MD   On: 08/26/2020 16:03      Scheduled Meds: . Chlorhexidine Gluconate Cloth  6 each Topical Daily  . insulin aspart  0-15 Units Subcutaneous Q6H  . insulin glargine  7 Units Subcutaneous QHS  . metoCLOPramide (REGLAN) injection  5 mg Intravenous Q8H  . metoprolol tartrate  7.5 mg Intravenous Q6H  . polyethylene glycol  17 g Oral Daily  . sodium chloride flush  10-40 mL Intracatheter Q12H   Continuous Infusions: . cefTRIAXone (ROCEPHIN)  IV Stopped (08/27/20 2247)  . dextrose 5  % and 0.9 % NaCl with KCl 40 mEq/L 10 mL/hr at 08/27/20 1400  . heparin 1,400 Units/hr (08/28/20 0818)  . magnesium sulfate bolus IVPB    . TPN ADULT (ION) 90 mL/hr at 08/27/20 1730  . TPN ADULT (ION)       LOS: 16 days      Time spent: 25 minutes   Dessa Phi, DO [facility] 08/28/2020, 9:49 AM   Available via Epic secure chat 7am-7pm After these hours, please refer to coverage provider listed on [email or web address]

## 2020-08-28 NOTE — Progress Notes (Signed)
VAST consulted to obtain heparin level via pt's central line. Called unit and spoke with Janett Billow, RN; requested she pause pt's heparin infusion so that lab can be obtained.

## 2020-08-28 NOTE — Progress Notes (Addendum)
PHARMACY - TOTAL PARENTERAL NUTRITION CONSULT NOTE   Indication: Prolonged ileus  Patient Measurements: Height: 5\' 5"  (165.1 cm) Weight: 105.3 kg (232 lb 2.3 oz) IBW/kg (Calculated) : 61.5 TPN AdjBW (KG): 72.7 Body mass index is 38.63 kg/m.  Assessment:  59 yo male with PMH gout, OA, DM2, anemia admitted with upper extremity DVT and development of colonic ileus. GI following, recommend NGT to LIWS, s/p neostigmine 10/1, no surgical indications at this point unless he perforates or has ischemia, have signed off 10/4.   Glucose / Insulin: Hx DM, A1c 7.18 Jun 2020 - CBGs (goal 100-150): poorly controlled; consistently 200-250 (worse than day prior) with 20 units insulin in TPN and 7 units Lantus daily - 20 units of moderate SSI given yesterday  Electrolytes: Na still slightly low (question interference from worsening hyperglycemia?); Mg remains < 2.0 despite 2g IV yesterday; others stable WNL - goal K >/= 4.0, Mg >/= 2.0 with ileus Renal: Hx CKD3; SCr, BUN stable WNL; bicarb dropped to slightly low; UOP remains adequate Hepatic: LFTs slightly up but remain WNL (not unexpected on TPN); tbili stable WNL; albumin remains low but stable Prealbumin: low but slowly improving TG: stable WNL I/O: NGT removed 10/8 - MIVF: D5NS KCl 26mEq/L at [facility] on IV reglan q8h GI Imaging: - 9/27 CT abd/pelvis: findings consistent with colonic ileus, pneumatosis of the ascending and hepatic flexure of the colon. - 10/3 AXR:  stable diffuse colonic dilatation is noted most consistent with colonic ileus/adynamic ileus with progression and colonic pneumatosis. - 10/9 AXR: Unchanged gaseous dilation of loops of large and small bowel. Surgeries / Procedures:   Central access: PICC TPN start date: 10/5  Nutritional Goals (per RD recommendation on 10/6): Kcal:  1850-2050 Protein:  95-110g Fluid:  2L/day  Goal TPN rate is 90 mL/hr (provides 108 g of protein and 2181 kcals per day)  Current Nutrition:   Diet advanced to soft 10/12 Per RN taking about 50% of meals with FLD  Plan:   Repeat Mag 2g bolus IV x1  Today at 1800:  Reduce TPN to 75 ml/hr with decent tolerance and advancement of diet  Electrolytes in TPN: increase Mg, acetate; can hold off on increasing Na until CBGs corrected  90 mEq/L of Na  50 mEq/L of K  5 mEq/L of Ca  12 mEq/L of Mg  15 mmol/L of Phos   Cl:Ac ratio 1:2  Standard MVI and trace elements to TPN  Advance SSI to resistant scale with q6h CBG checks   Increase regular insulin in TPN to 23 units (equivalent to increasing to 30 units at goal rate) continue Lantus 7 units qhs  Continue M[facility]F at [facility] but will remove D5 and KCl  Monitor TPN labs on Mon/Thurs  [place], [place], PharmD, BCPS [phone] 08/29/2020, 9:38 AM

## 2020-08-28 NOTE — Progress Notes (Signed)
UNASSIGNED PATIENT Subjective: Since I last evaluated the patient, his condition has not changed much.  He continues to have problems with abdominal distention but has been passing flatus and has had a bowel movement this morning.  He claims he is walked down the hall once with physical therapy today.  He denies having any abdominal pain or nausea.  Objective: Vital signs in last 24 hours: Temp:  [98 F (36.7 C)-98.5 F (36.9 C)] 98.2 F (36.8 C) (10/11 0500) Pulse Rate:  [74-88] 88 (10/11 0500) Resp:  [13-21] 21 (10/11 0500) BP: (150-164)/(80-107) 150/82 (10/11 0500) SpO2:  [100 %] 100 % (10/10 1600) Last BM Date: 08/27/20  Intake/Output from previous day: 10/10 0701 - 10/11 0700 In: 1666 [P.O.:600; I.V.:866; IV Piggyback:200] Out: 2350 [Urine:2350] Intake/Output this shift: No intake/output data recorded.  General appearance: alert, cooperative, fatigued, morbidly obese and pale Resp: clear to auscultation bilaterally Cardio: regular rate and rhythm, S1, S2 normal, no murmur, click, rub or gallop GI: soft, distended with high-pitched tinkles non-tender; no masses,  no organomegaly Extremities: edema bilaterally  Lab Results: Recent Labs    08/26/20 0415 08/27/20 0311 08/28/20 0400  WBC 7.6 8.3 7.7  HGB 8.0* 7.9* 7.7*  HCT 26.4* 25.7* 25.0*  PLT 183 165 154   BMET Recent Labs    08/26/20 0415 08/27/20 0311 08/28/20 0400  NA 135 136 134*  K 4.1 3.9 4.3  CL 104 103 104  CO2 23 23 22   GLUCOSE 170* 192* 197*  BUN 14 17 15   CREATININE 0.69 0.76 0.70  CALCIUM 8.4* 8.5* 8.6*   LFT Recent Labs    08/28/20 0400  PROT 5.9*  ALBUMIN 2.5*  AST 30  ALT 33  ALKPHOS 84  BILITOT 0.7   Studies/Results: DG Abd 1 View  Result Date: 08/26/2020 CLINICAL DATA:  Distension EXAM: ABDOMEN - 1 VIEW COMPARISON:  August 22, 2020 FINDINGS: There is unchanged gaseous dilation of loops of large and small bowel. Enteric tube is been removed. Visualized lung bases are  unremarkable. Degenerative changes of the lower lumbar spine. IMPRESSION: Unchanged gaseous dilation of loops of large and small bowel. Electronically Signed   By: Valentino Saxon MD   On: 08/26/2020 16:04   DG CHEST PORT 1 VIEW  Result Date: 08/26/2020 CLINICAL DATA:  Fever EXAM: PORTABLE CHEST 1 VIEW COMPARISON:  August 10, 2020. FINDINGS: The cardiomediastinal silhouette is unchanged in contour.RIGHT upper extremity PICC tip terminates over the SVC. No pleural effusion. No pneumothorax. No acute pleuroparenchymal abnormality. Partial visualization of gaseous dilation of loops of bowel. No acute osseous abnormality. IMPRESSION: 1. No acute cardiopulmonary abnormality. 2. Partial visualization of gaseous dilation of loops of bowel. Electronically Signed   By: Valentino Saxon MD   On: 08/26/2020 16:03   Medications: I have reviewed the patient's current medications.  Assessment/Plan: 1) Colonic and small bowel ileus-on clear liquids with minimal improvement.  Encourage ambulation and correct electrolytes as best as possible.  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 4) Left upper extremity DVT on heparin. 5) Iron deficiency anemia.  LOS: 16 days   Juanita Craver 08/28/2020, 12:31 PM

## 2020-08-28 NOTE — Progress Notes (Signed)
ANTICOAGULATION CONSULT NOTE - Follow Up Consult  Pharmacy Consult for IV heparin (on apixaban PTA) Indication: history of DVT  Allergies  Allergen Reactions  . Hctz [Hydrochlorothiazide]     Hx of gout; uric acid 14.1 on 03/13/20 Acute gout L elbow 8/11-8/24/21    Patient Measurements: Height: 5\' 5"  (165.1 cm) Weight: 105.3 kg (232 lb 2.3 oz) IBW/kg (Calculated) : 61.5 Heparin Dosing Weight: 85.7 kg  Vital Signs: Temp: 98.2 F (36.8 C) (10/11 0500) Temp Source: Oral (10/11 0500) BP: 150/82 (10/11 0500) Pulse Rate: 88 (10/11 0500)  Labs: Recent Labs    08/26/20 0415 08/26/20 0415 08/27/20 0311 08/28/20 0400  HGB 8.0*   < > 7.9* 7.7*  HCT 26.4*  --  25.7* 25.0*  PLT 183  --  165 154  HEPARINUNFRC 0.41  --  0.43 0.21*  CREATININE 0.69  --  0.76 0.70   < > = values in this interval not displayed.    Estimated Creatinine Clearance: 109.7 mL/min (by C-G formula based on SCr of 0.7 mg/dL).    Assessment: 68 y/oM with PMH of DVT (07/03/2020) on Apixaban PTA admitted on 08/10/2020 with SIRS. PO meds held d/t colonic ileus. Pharmacy consulted for IV heparin dosing while Apixaban on hold.    Heparin level SUBtherapeutic on current IV heparin rate of 1300 units/hr; has been therapeutic on same rate for past 72hr  CBC: Hgb 7.7, low but stable. Pltc WNL.   No bleeding or infusion issues documentee  Goal of Therapy:  Heparin level 0.3-0.7 units/ml Monitor platelets by anticoagulation protocol: Yes   Plan:   Increase IV heparin to 1400 units/hr  Recheck heparin level in 6hr  Daily heparin level and CBC  Monitor closely for signs and symptoms of bleeding  Peggyann Juba, PharmD, BCPS Pharmacy: [phone] 08/28/2020 7:40 AM

## 2020-08-28 NOTE — Plan of Care (Signed)
  Problem: Skin Integrity: Goal: Risk for impaired skin integrity will decrease Outcome: Progressing   Problem: Tissue Perfusion: Goal: Adequacy of tissue perfusion will improve Outcome: Progressing   Problem: Pain Managment: Goal: General experience of comfort will improve Outcome: Progressing   Problem: Safety: Goal: Ability to remain free from injury will improve Outcome: Progressing   Problem: Skin Integrity: Goal: Risk for impaired skin integrity will decrease Outcome: Progressing

## 2020-08-29 ENCOUNTER — Inpatient Hospital Stay (HOSPITAL_COMMUNITY): Payer: Medicare HMO

## 2020-08-29 DIAGNOSIS — K567 Ileus, unspecified: Secondary | ICD-10-CM | POA: Diagnosis not present

## 2020-08-29 LAB — COMPREHENSIVE METABOLIC PANEL
ALT: 41 U/L (ref 0–44)
AST: 33 U/L (ref 15–41)
Albumin: 2.5 g/dL — ABNORMAL LOW (ref 3.5–5.0)
Alkaline Phosphatase: 95 U/L (ref 38–126)
Anion gap: 7 (ref 5–15)
BUN: 15 mg/dL (ref 6–20)
CO2: 21 mmol/L — ABNORMAL LOW (ref 22–32)
Calcium: 8.3 mg/dL — ABNORMAL LOW (ref 8.9–10.3)
Chloride: 104 mmol/L (ref 98–111)
Creatinine, Ser: 0.69 mg/dL (ref 0.61–1.24)
GFR, Estimated: >60 mL/min (ref >60)
Glucose, Bld: 294 mg/dL — ABNORMAL HIGH (ref 70–99)
Potassium: 4.6 mmol/L (ref 3.5–5.1)
Sodium: 132 mmol/L — ABNORMAL LOW (ref 135–145)
Total Bilirubin: 0.4 mg/dL (ref 0.3–1.2)
Total Protein: 6 g/dL — ABNORMAL LOW (ref 6.5–8.1)

## 2020-08-29 LAB — MAGNESIUM: Magnesium: 1.8 mg/dL (ref 1.7–2.4)

## 2020-08-29 LAB — CBC
HCT: 24.6 % — ABNORMAL LOW (ref 39.0–52.0)
Hemoglobin: 7.6 g/dL — ABNORMAL LOW (ref 13.0–17.0)
MCH: 24.3 pg — ABNORMAL LOW (ref 26.0–34.0)
MCHC: 30.9 g/dL (ref 30.0–36.0)
MCV: 78.6 fL — ABNORMAL LOW (ref 80.0–100.0)
Platelets: 148 10*3/uL — ABNORMAL LOW (ref 150–400)
RBC: 3.13 MIL/uL — ABNORMAL LOW (ref 4.22–5.81)
RDW: 22.4 % — ABNORMAL HIGH (ref 11.5–15.5)
WBC: 9 10*3/uL (ref 4.0–10.5)
nRBC: 0 % (ref 0.0–0.2)

## 2020-08-29 LAB — GLUCOSE, CAPILLARY
Glucose-Capillary: 240 mg/dL — ABNORMAL HIGH (ref 70–99)
Glucose-Capillary: 254 mg/dL — ABNORMAL HIGH (ref 70–99)
Glucose-Capillary: 236 mg/dL — ABNORMAL HIGH (ref 70–99)

## 2020-08-29 LAB — HEPARIN LEVEL (UNFRACTIONATED): Heparin Unfractionated: 0.39 IU/mL (ref 0.30–0.70)

## 2020-08-29 LAB — PHOSPHORUS: Phosphorus: 3.6 mg/dL (ref 2.5–4.6)

## 2020-08-29 MED ORDER — TRAVASOL 10 % IV SOLN
INTRAVENOUS | Status: AC
  Filled 2020-08-29: qty 900

## 2020-08-29 MED ORDER — SODIUM CHLORIDE 0.9 % IV SOLN
500.0000 mg | INTRAVENOUS | Status: AC
  Administered 2020-08-29 – 2020-09-01 (×4): 500 mg via INTRAVENOUS
  Filled 2020-08-29 (×4): qty 500

## 2020-08-29 MED ORDER — MAGNESIUM SULFATE 2 GM/50ML IV SOLN
2.0000 g | Freq: Once | INTRAVENOUS | Status: AC
  Administered 2020-08-29: 2 g via INTRAVENOUS
  Filled 2020-08-29: qty 50

## 2020-08-29 MED ORDER — INSULIN ASPART 100 UNIT/ML ~~LOC~~ SOLN
0.0000 [IU] | Freq: Four times a day (QID) | SUBCUTANEOUS | Status: DC
  Administered 2020-08-29: 7 [IU] via SUBCUTANEOUS
  Administered 2020-08-29: 11 [IU] via SUBCUTANEOUS
  Administered 2020-08-30: 4 [IU] via SUBCUTANEOUS
  Administered 2020-08-30: 7 [IU] via SUBCUTANEOUS

## 2020-08-29 MED ORDER — SODIUM CHLORIDE 0.9 % IV SOLN: INTRAVENOUS | Status: DC

## 2020-08-29 NOTE — Progress Notes (Signed)
ANTICOAGULATION CONSULT NOTE - Follow Up Consult  Pharmacy Consult for IV heparin (on apixaban PTA) Indication: history of DVT  Allergies  Allergen Reactions  . Hctz [Hydrochlorothiazide]     Hx of gout; uric acid 14.1 on 03/13/20 Acute gout L elbow 8/11-8/24/21    Patient Measurements: Height: 5\' 5"  (165.1 cm) Weight: 103.3 kg (227 lb 11.8 oz) IBW/kg (Calculated) : 61.5 Heparin Dosing Weight: 85.7 kg  Vital Signs: Temp: 100.9 F (38.3 C) (10/12 0920) Temp Source: Oral (10/12 0920) BP: 183/101 (10/12 0627) Pulse Rate: 94 (10/12 0500)  Labs: Recent Labs    08/27/20 0311 08/27/20 0311 08/28/20 0400 08/28/20 1450 08/29/20 0425  HGB 7.9*   < > 7.7*  --  7.6*  HCT 25.7*  --  25.0*  --  24.6*  PLT 165  --  154  --  148*  HEPARINUNFRC 0.43   < > 0.21* 0.48 0.39  CREATININE 0.76  --  0.70  --  0.69   < > = values in this interval not displayed.    Estimated Creatinine Clearance: 108.6 mL/min (by C-G formula based on SCr of 0.69 mg/dL).  Assessment: 28 y/oM with PMH of DVT (07/03/2020) on Apixaban PTA admitted on 08/10/2020 with SIRS. PO meds held d/t colonic ileus. Pharmacy consulted for IV heparin dosing while Apixaban on hold.   08/29/20 9:25 AM   Daily heparin level remains therapeutic and relatively stable on 1400 units/hr  CBC: Hgb low but stable Pltc WNL  No bleeding or infusion issues documented  Still with some abdominal distention, but overall improved; tolerating fulls and advanced to soft diet today  Goal of Therapy:  Heparin level 0.3-0.7 units/ml Monitor platelets by anticoagulation protocol: Yes   Plan:   Continue heparin infusion at 1400 units/hr  Daily heparin level and CBC  Monitor closely for signs and symptoms of bleeding  F/u ability to resume [place], PharmD, BCPS [phone] 08/29/2020, 9:34 AM

## 2020-08-29 NOTE — Progress Notes (Signed)
Occupational Therapy Treatment Patient Details Name: [name] MRN: [id number] DOB: 01-24-1961 Today's Date: 08/29/2020    History of present illness 59 yo male admitted to [facility] on 9/24 with weakness, joint pain, difficulty urinating. Pt meeting SIRS criteria, possibly secondary to urinary infection. NGT placed 10/1 for ileus. Now clamped. PMHx significant for chronic gouty arthritis of multiple joints, CKD II, IDDM, HTN, HLD, and chronic anemia. Admitted to [facility] 8/11-8/24 for AKI, LUE DVT, and joint pain, d/c SNF.   OT comments  Patient instructed in use of AE reacher + sock aid for LB dressing. Patient require min A and min cues for utilization of reacher to doff R sock, able to doff L more easily. Max A and cues for problem solving use of sock aid to don B socks. Patient with weak grip strength and B UE edema, educate/encourage patient to perform fist pumps throughout the day to improve functional use of hands. Pt set up to wash face seated in chair. Continued recommendation for SNF D/C in order to maximize patient independence and reduce caregiver burden for ADLs and functional transfers.   Follow Up Recommendations  SNF    Equipment Recommendations  None recommended by OT       Precautions / Restrictions Precautions Precautions: Fall       Mobility Bed Mobility               General bed mobility comments: OOB in recliner         ADL either performed or assessed with clinical judgement   ADL Overall ADL's : Needs assistance/impaired     Grooming: Set up;Wash/dry face;Sitting               Lower Body Dressing: Moderate assistance;Sitting/lateral leans;Cueing for sequencing;With adaptive equipment Lower Body Dressing Details (indicate cue type and reason): instructed patient is use of reacher and sock aid for LB dressing. patient require min A with reacher to complete doffing R sock and max A with max cues for problem solving to utilize sock aid. patient with  B weak grip strength and edema making it difficult to grasp + pull on handles. encourage fist pumps throughout the day to work on Nurse, children's.                                Cognition Arousal/Alertness: Awake/alert Behavior During Therapy: WFL for tasks assessed/performed Overall Cognitive Status: Impaired/Different from baseline Area of Impairment: Problem solving                             Problem Solving: Requires verbal cues;Slow processing General Comments: difficulty problem solving using AE for LB dressing, required mod to max cues and assist                   Pertinent Vitals/ Pain       Pain Assessment: Faces Faces Pain Scale: No hurt         Frequency  Min 2X/week        Progress Toward Goals  OT Goals(current goals can now be found in the care plan section)  Progress towards OT goals: Progressing toward goals  Acute Rehab OT Goals Patient Stated Goal: get stronger OT Goal Formulation: With patient Time For Goal Achievement: 09/08/20 Potential to Achieve Goals: [place] Discharge plan remains appropriate       AM-PAC OT "6  Clicks" Daily Activity     Outcome Measure   Help from another person eating meals?: A Little Help from another person taking care of personal grooming?: A Little Help from another person toileting, which includes using toliet, bedpan, or urinal?: A Lot Help from another person bathing (including washing, rinsing, drying)?: A Lot Help from another person to put on and taking off regular upper body clothing?: A Lot Help from another person to put on and taking off regular lower body clothing?: A Lot 6 Click Score: 14    End of Session Equipment Utilized During Treatment: Other (comment) (AE)  OT Visit Diagnosis: Muscle weakness (generalized) (M62.81)   Activity Tolerance Patient tolerated treatment well   Patient Left in chair;with call bell/phone within reach;with chair alarm  set           Time: 0740-0800 OT Time Calculation (min): 20 min  Charges: OT General Charges $OT Visit: 1 Visit OT Treatments $Self Care/Home Management : 8-22 mins  Delbert Phenix OT OT pager: [place] 08/29/2020, 10:41 AM

## 2020-08-29 NOTE — Progress Notes (Addendum)
PROGRESS NOTE    [name]  OIN:[id number] DOB: 04-26-1961 DOA: 08/10/2020 PCP: Charlott Rakes, MD     Brief Narrative:  [name] is a 59 yo male with past medical history significant of type 2 diabetes, chronic gout who was recently hospitalized 8/11-8/24 with AKI, at that time found to have left upper extremity DVT in addition to polyarticular gout.  He now comes in with sepsis, UTI, bowel obstruction. He has been on IV heparin due to LUE DVT as well as TPN due to ileus.   New events last 24 hours / Subjective: Having chills this morning, temp 100.9. He also complains of having productive cough, white sputum. No nausea, vomiting, abdominal pain although abdomen appears more distended today.   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 Dr. Benson Norway advanced him to soft diet this morning but on my exam, his abdomen is more distended compared to last few days. Will leave on full liquid diet at present. Repeat KUB shows persistent dilated small bowel   Sepsis secondary to E. coli UTI, present on admission -Sepsis ruled in -Completed antibiotics 10/2  -Fever 10/9 and UA indicative of UTI - started rocephin 10/9 -Blood culture 10/9 NGTD  -Urine culture + E coli again, pansensitive   -Fever again 10/12 with tachycardia - add azithromycin to cover possible PNA   Left upper extremity DVT -Was on Eliquis as an outpatient.  Currently on IV heparin due to above   Polyarticular gout with osteoarthritis -Patient has outpatient follow-up with rheumatology  Diabetes mellitus type 2, well controlled -Hemoglobin A1c 7.1  -Lantus, sliding scale insulin  CKD stage II -Stable  Iron deficiency anemia -Status post IV Feraheme 9/25    DVT prophylaxis: IV heparin   Code Status: Full code Family Communication: No family at bedside Disposition Plan:  Status is: Inpatient  Remains inpatient appropriate because:IV treatments appropriate due to intensity of illness or inability to take PO   Dispo:  Patient From:  Home  Planned Disposition: [place]  Expected discharge date: > 3 days  Medically stable for discharge:   Not medically stable for discharge.  He remains on TPN    Antimicrobials:  Anti-infectives (From admission, onward)   Start     Dose/Rate Route Frequency Ordered Stop   08/29/20 1030  azithromycin (ZITHROMAX) 500 mg in sodium chloride 0.9 % 250 mL IVPB        500 mg 250 mL/hr over 60 Minutes Intravenous Every 24 hours 08/29/20 0940     08/26/20 2300  cefTRIAXone (ROCEPHIN) 1 g in sodium chloride 0.9 % 100 mL IVPB        1 g 200 mL/hr over 30 Minutes Intravenous Every 24 hours 08/26/20 2209     08/15/20 1600  ceFAZolin (ANCEF) IVPB  1 g/50 mL premix        1 g 100 mL/hr over 30 Minutes Intravenous Every 8 hours 08/15/20 1429 08/19/20 2243   08/14/20 1730  cefTRIAXone (ROCEPHIN) 1 g in sodium chloride 0.9 % 100 mL IVPB  Status:  Discontinued        1 g 200 mL/hr over 30 Minutes Intravenous Every 24 hours 08/14/20 1647 08/15/20 1429   08/12/20 1230  cephALEXin (KEFLEX) capsule 500 mg  Status:  Discontinued        500 mg Oral Every 12 hours 08/12/20 1139 08/14/20 1647       Objective: Vitals:   08/29/20 1000 08/29/20 1027 08/29/20  1100 08/29/20 1200  BP:  (!) 174/110 (!) 162/81 (!) 161/96  Pulse: (!) 121 (!) 113 (!) 113 (!) 55  Resp: 19 (!) 22 (!) 21 19  Temp:  99.9 F (37.7 C) 98.9 F (37.2 C) 98.2 F (36.8 C)  TempSrc:   Oral Oral  SpO2: 98% 99% 98% 97%  Weight:      Height:        Intake/Output Summary (Last 24 hours) at 08/29/2020 1209 Last data filed at 08/29/2020 1000 Gross per 24 hour  Intake 4706.19 ml  Output 1650 ml  Net 3056.19 ml   Filed Weights   08/21/20 0500 08/22/20 0509 08/28/20 1820  Weight: 108.4 kg 105.3 kg 103.3 kg    Examination: General exam: Appears calm and comfortable  Respiratory system: Clear to auscultation. Respiratory effort normal. Cardiovascular system: S1 & S2 heard, tachycardic, regular rhythm. No pedal edema. Gastrointestinal system: Abdomen is distended more than yesterday, non tender to palpation, +BS  Central nervous system: Alert and oriented. Non focal exam. Speech clear  Extremities: Symmetric in appearance bilaterally  Skin: No rashes, lesions or ulcers on exposed skin  Psychiatry: Judgement and insight appear stable. Mood & affect appropriate.    Data Reviewed: I have personally reviewed following labs and imaging studies  CBC: Recent Labs  Lab 08/25/20 0530 08/26/20 0415 08/27/20 0311 08/28/20 0400 08/29/20 0425  WBC 5.8 7.6 8.3 7.7 9.0  NEUTROABS  --   --   --  4.7  --   HGB 8.8* 8.0* 7.9* 7.7* 7.6*  HCT 29.0* 26.4* 25.7* 25.0* 24.6*  MCV 79.2* 79.3* 80.1 79.6* 78.6*  PLT 204 183 165 154 161*   Basic Metabolic Panel: Recent Labs  Lab 08/24/20 0411 08/24/20 0411 08/25/20 0530 08/26/20 0415 08/27/20 0311 08/28/20 0400 08/29/20 0425  NA 136   < > 134* 135 136 134* 132*  K 4.0   < > 4.0 4.1 3.9 4.3 4.6  CL 104   < > 104 104 103 104 104  CO2 25   < > 23 23 23 22  21*  GLUCOSE 169*   < > 218* 170* 192* 197* 294*  BUN 8   < > 12 14 17 15 15   CREATININE 0.69   < > 0.70 0.69 0.76 0.70 0.69  CALCIUM 8.2*   < > 8.4* 8.4* 8.5* 8.6* 8.3*   MG 1.7   < > 1.8 1.9 2.1 1.8 1.8  PHOS 2.9  --  3.5 3.8  --  3.5 3.6   < > = values in this interval not displayed.   GFR: Estimated Creatinine Clearance: 108.6 mL/min (by C-G formula based on SCr of 0.69 mg/dL). Liver Function Tests: Recent Labs  Lab 08/23/20 0755 08/24/20 0411 08/26/20 0415 08/28/20 0400 08/29/20 0425  AST 22 22 24 30  33  ALT  20 21 23  33 41  ALKPHOS 75 74 71 84 95  BILITOT 0.7 0.6 0.4 0.7 0.4  PROT 5.2* 5.6* 5.6* 5.9* 6.0*  ALBUMIN 2.4* 2.5* 2.6* 2.5* 2.5*   No results for input(s): LIPASE, AMYLASE in the last 168 hours. No results for input(s): AMMONIA in the last 168 hours. Coagulation Profile: No results for input(s): INR, PROTIME in the last 168 hours. Cardiac Enzymes: No results for input(s): CKTOTAL, CKMB, CKMBINDEX, TROPONINI in the last 168 hours. BNP (last 3 results) No results for input(s): PROBNP in the last 8760 hours. HbA1C: No results for input(s): HGBA1C in the last 72 hours. CBG: Recent Labs  Lab 08/28/20 1207 08/28/20 1732 08/28/20 2330 08/29/20 0539 08/29/20 1115  GLUCAP 241* 244* 216* 240* 254*   Lipid Profile: Recent Labs    08/28/20 0400  TRIG 109   Thyroid Function Tests: No results for input(s): TSH, T4TOTAL, FREET4, T3FREE, THYROIDAB in the last 72 hours. Anemia Panel: No results for input(s): VITAMINB12, FOLATE, FERRITIN, TIBC, IRON, RETICCTPCT in the last 72 hours. Sepsis Labs: No results for input(s): PROCALCITON, LATICACIDVEN in the last 168 hours.  Recent Results (from the past 240 hour(s))  Culture, blood (routine x 2)     Status: None (Preliminary result)   Collection Time: 08/26/20  2:27 PM   Specimen: BLOOD  Result Value Ref Range Status   Specimen Description   Final    BLOOD LEFT FOOT Performed at [facility] [address], [place], [place] [address]    Special Requests   Final    BOTTLES DRAWN AEROBIC AND ANAEROBIC Blood Culture adequate volume Performed at [facility] [address] [place], [place] [address]    Culture   Final    NO GROWTH 3 DAYS Performed at [facility], [place] [address], [place], [place] [address]    Report Status PENDING  Incomplete  Culture, Urine     Status: Abnormal (Preliminary result)   Collection Time: 08/26/20  5:44 PM   Specimen: Urine, Clean Catch  Result Value Ref Range Status   Specimen Description   Final    URINE, CLEAN CATCH Performed at [facility], [place] [address] [place], [place] [address]    Special Requests   Final    NONE Performed at [facility], [place] [address] [place], [place] [address]    Culture (A)  Final    >=100,000 COLONIES/mL ESCHERICHIA COLI CULTURE REINCUBATED FOR BETTER GROWTH Performed at [facility], [place] [address] [place], [place] [address]    Report Status PENDING  Incomplete   Organism ID, Bacteria ESCHERICHIA COLI (A)  Final      Susceptibility   Escherichia coli - MIC*    AMPICILLIN 4 SENSITIVE Sensitive     CEFAZOLIN <=4 SENSITIVE Sensitive     CEFTRIAXONE <=0.25 SENSITIVE Sensitive     CIPROFLOXACIN <=0.25 SENSITIVE Sensitive     GENTAMICIN <=1 SENSITIVE Sensitive     IMIPENEM <=0.25 SENSITIVE Sensitive     NITROFURANTOIN <=16 SENSITIVE Sensitive     TRIMETH/SULFA <=20 SENSITIVE Sensitive     AMPICILLIN/SULBACTAM <=2 SENSITIVE Sensitive     PIP/TAZO <=4 SENSITIVE Sensitive     * >=100,000 COLONIES/mL ESCHERICHIA COLI      Radiology Studies: DG Abd 1 View  Result Date: 08/29/2020 CLINICAL DATA:  Fever.  Follow-up stent bowel. EXAM: ABDOMEN - 1 VIEW COMPARISON:  Abdominal radiographs 08/26/2020, 08/22/2020, and 08/21/2020. FINDINGS: Persistent dilated  loops of small bowel are present. There is some gas in the colon. No definite free air is present. Decubitus views would be more helpful for assessment of fluid levels or free air. Bowel distention is increasing. IMPRESSION: 1. Persistent dilated loops  of small bowel, compatible with small bowel obstruction. 2. No definite free air. Electronically Signed   By: San Morelle M.D.   On: 08/29/2020 10:40   DG CHEST PORT 1 VIEW  Result Date: 08/29/2020 CLINICAL DATA:  59 year old male with history of fever. Distended bowel. EXAM: PORTABLE CHEST 1 VIEW COMPARISON:  Chest x-ray 08/26/2020. FINDINGS: There is a right upper extremity PICC with tip terminating in the distal superior vena cava. Lung volumes are low. No consolidative airspace disease. No pleural effusions. No pneumothorax. No pulmonary nodule or mass noted. Pulmonary vasculature and the cardiomediastinal silhouette are within normal limits. Visualized portions of the upper abdomen demonstrate gaseous distension in the visualized bowel. IMPRESSION: 1. Low lung volumes without radiographic evidence of acute cardiopulmonary disease. 2. Gaseous distension of the bowel noted beneath the diaphragm. Electronically Signed   By: Vinnie Langton M.D.   On: 08/29/2020 10:44      Scheduled Meds: . Chlorhexidine Gluconate Cloth  6 each Topical Daily  . insulin aspart  0-20 Units Subcutaneous Q6H  . insulin glargine  7 Units Subcutaneous QHS  . metoCLOPramide (REGLAN) injection  5 mg Intravenous Q8H  . metoprolol tartrate  7.5 mg Intravenous Q6H  . polyethylene glycol  17 g Oral Daily  . sodium chloride flush  10-40 mL Intracatheter Q12H   Continuous Infusions: . sodium chloride    . azithromycin    . cefTRIAXone (ROCEPHIN)  IV Stopped (08/29/20 0019)  . heparin 1,400 Units/hr (08/29/20 0709)  . magnesium sulfate bolus IVPB    . TPN ADULT (ION) 90 mL/hr at 08/29/20 0600  . TPN ADULT (ION)       LOS: 17 days      Time spent: 35 minutes   Dessa Phi, DO [facility] 08/29/2020, 12:09 PM   Available via Epic secure chat 7am-7pm After these hours, please refer to coverage provider listed on [email or web address]

## 2020-08-29 NOTE — Progress Notes (Signed)
Pt had 7 beats of Vtach on tele, pt asymptomatic, MD notified.

## 2020-08-29 NOTE — Progress Notes (Signed)
   08/29/20 2215  Assess: MEWS Score  Temp (!) 101.5 F (38.6 C)  BP (!) 162/85  Resp (!) 22  O2 Device Room Air  Assess: MEWS Score  MEWS Temp 2  MEWS Systolic 0  MEWS Pulse 1  MEWS RR 1  MEWS LOC 0  MEWS Score 4  MEWS Score Color Red  Assess: if the MEWS score is Yellow or Red  Were vital signs taken at a resting state? Yes  Focused Assessment No change from prior assessment  Early Detection of Sepsis Score *See Row Information* High  MEWS guidelines implemented *See Row Information* Yes  Treat  MEWS Interventions Administered prn meds/treatments;Escalated (See documentation below)  Take Vital Signs  Increase Vital Sign Frequency  Red: Q 1hr X 4 then Q 4hr X 4, if remains red, continue Q 4hrs  Escalate  MEWS: Escalate Red: discuss with charge nurse/RN and provider, consider discussing with RRT  Notify: Charge Nurse/RN  Name of Charge Nurse/RN Notified Alexcia, RN  Notify: Provider  Provider Name/Title Sharlet Salina, NP  Notification Type Page  Response No new orders

## 2020-08-29 NOTE — Plan of Care (Signed)

## 2020-08-29 NOTE — Progress Notes (Addendum)
Subjective: No complaints.  He had a bowel movement this AM.  Liquid.  Objective: Vital signs in last 24 hours: Temp:  [98.1 F (36.7 C)-98.3 F (36.8 C)] 98.1 F (36.7 C) (10/12 0500) Pulse Rate:  [88-99] 94 (10/12 0500) Resp:  [18-21] 21 (10/12 0500) BP: (164-183)/(95-108) 183/101 (10/12 0627) SpO2:  [100 %] 100 % (10/11 1700) Weight:  [103.3 kg] 103.3 kg (10/11 1820) Last BM Date: 08/28/20  Intake/Output from previous day: 10/11 0701 - 10/12 0700 In: 4731.4 [P.O.:720; I.V.:3813.3; IV Piggyback:198.1] Out: 1650 [Urine:1650] Intake/Output this shift: Total I/O In: 1428.2 [I.V.:1328.2; IV Piggyback:100] Out: 950 [Urine:950]  General appearance: alert and no distress Resp: clear to auscultation bilaterally Cardio: regular rate and rhythm GI: distended, tympanic, mildly soft, +BS Extremities: 3+ edema  Lab Results: Recent Labs    08/27/20 0311 08/28/20 0400 08/29/20 0425  WBC 8.3 7.7 9.0  HGB 7.9* 7.7* 7.6*  HCT 25.7* 25.0* 24.6*  PLT 165 154 148*   BMET Recent Labs    08/27/20 0311 08/28/20 0400 08/29/20 0425  NA 136 134* 132*  K 3.9 4.3 4.6  CL 103 104 104  CO2 23 22 21*  GLUCOSE 192* 197* 294*  BUN 17 15 15   CREATININE 0.76 0.70 0.69  CALCIUM 8.5* 8.6* 8.3*   LFT Recent Labs    08/29/20 0425  PROT 6.0*  ALBUMIN 2.5*  AST 33  ALT 41  ALKPHOS 95  BILITOT 0.4   PT/INR No results for input(s): LABPROT, INR in the last 72 hours. Hepatitis Panel No results for input(s): HEPBSAG, HCVAB, HEPAIGM, HEPBIGM in the last 72 hours. C-Diff No results for input(s): CDIFFTOX in the last 72 hours. Fecal Lactopherrin No results for input(s): FECLLACTOFRN in the last 72 hours.  Studies/Results: No results found.  Medications:  Scheduled: . Chlorhexidine Gluconate Cloth  6 each Topical Daily  . insulin aspart  0-15 Units Subcutaneous Q6H  . insulin glargine  7 Units Subcutaneous QHS  . metoCLOPramide (REGLAN) injection  5 mg Intravenous Q8H  .  metoprolol tartrate  7.5 mg Intravenous Q6H  . polyethylene glycol  17 g Oral Daily  . sodium chloride flush  10-40 mL Intracatheter Q12H   Continuous: . cefTRIAXone (ROCEPHIN)  IV Stopped (08/29/20 0019)  . dextrose 5 % and 0.9 % NaCl with KCl 40 mEq/L 10 mL/hr at 08/29/20 0600  . heparin 1,400 Units/hr (08/29/20 0600)  . TPN ADULT (ION) 90 mL/hr at 08/29/20 0600    Assessment/Plan: 1) Persistent ileus. 2) Anemia. 3) Heme positive stool.   There is no change with his ileus.  He remains distended, but without any abdominal pain.  The end point for his ileus may be to demonstrate that he can tolerate adequate PO without any worsening of his ileus.  He is tolerating PO (full liquids) without any issues.  His HGB is also noted to be drifting down.  He varied in the 8-9 range, but now he is down in the 7 range.  This occurred before.  His stool on 06/28/2020 was heme positive.  With his current condition he cannot tolerate a prep.  It is possible to perform an unprepped colonoscopy, but it will not be an accurate test if stool cannot be washed away.  Plan: 1) Follow HGB and transfuse as necessary. 2) Increase diet to soft diet. 3) Continue with mobility. 4) Control blood sugar.  LOS: 17 days   Jessee Newnam D 08/29/2020, 6:33 AM

## 2020-08-30 DIAGNOSIS — A4151 Sepsis due to Escherichia coli [E. coli]: Principal | ICD-10-CM

## 2020-08-30 DIAGNOSIS — N182 Chronic kidney disease, stage 2 (mild): Secondary | ICD-10-CM | POA: Diagnosis not present

## 2020-08-30 DIAGNOSIS — I82622 Acute embolism and thrombosis of deep veins of left upper extremity: Secondary | ICD-10-CM | POA: Diagnosis not present

## 2020-08-30 DIAGNOSIS — R000 Tachycardia, unspecified: Secondary | ICD-10-CM

## 2020-08-30 DIAGNOSIS — K567 Ileus, unspecified: Secondary | ICD-10-CM | POA: Diagnosis not present

## 2020-08-30 DIAGNOSIS — E872 Acidosis: Secondary | ICD-10-CM | POA: Diagnosis not present

## 2020-08-30 LAB — CBC
HCT: 23.8 % — ABNORMAL LOW (ref 39.0–52.0)
Hemoglobin: 7.5 g/dL — ABNORMAL LOW (ref 13.0–17.0)
MCH: 24.8 pg — ABNORMAL LOW (ref 26.0–34.0)
MCHC: 31.5 g/dL (ref 30.0–36.0)
MCV: 78.5 fL — ABNORMAL LOW (ref 80.0–100.0)
Platelets: 146 10*3/uL — ABNORMAL LOW (ref 150–400)
RBC: 3.03 MIL/uL — ABNORMAL LOW (ref 4.22–5.81)
RDW: 22.3 % — ABNORMAL HIGH (ref 11.5–15.5)
WBC: 9.7 10*3/uL (ref 4.0–10.5)
nRBC: 0 % (ref 0.0–0.2)

## 2020-08-30 LAB — COMPREHENSIVE METABOLIC PANEL
ALT: 47 U/L — ABNORMAL HIGH (ref 0–44)
AST: 36 U/L (ref 15–41)
Albumin: 2.4 g/dL — ABNORMAL LOW (ref 3.5–5.0)
Alkaline Phosphatase: 110 U/L (ref 38–126)
Anion gap: 8 (ref 5–15)
BUN: 15 mg/dL (ref 6–20)
CO2: 22 mmol/L (ref 22–32)
Calcium: 8.2 mg/dL — ABNORMAL LOW (ref 8.9–10.3)
Chloride: 104 mmol/L (ref 98–111)
Creatinine, Ser: 0.64 mg/dL (ref 0.61–1.24)
GFR, Estimated: >60 mL/min (ref >60)
Glucose, Bld: 193 mg/dL — ABNORMAL HIGH (ref 70–99)
Potassium: 3.5 mmol/L (ref 3.5–5.1)
Sodium: 134 mmol/L — ABNORMAL LOW (ref 135–145)
Total Bilirubin: 0.6 mg/dL (ref 0.3–1.2)
Total Protein: 5.9 g/dL — ABNORMAL LOW (ref 6.5–8.1)

## 2020-08-30 LAB — HEPARIN LEVEL (UNFRACTIONATED)
Heparin Unfractionated: 0.23 IU/mL — ABNORMAL LOW (ref 0.30–0.70)
Heparin Unfractionated: 0.26 IU/mL — ABNORMAL LOW (ref 0.30–0.70)
Heparin Unfractionated: 0.37 IU/mL (ref 0.30–0.70)

## 2020-08-30 LAB — GLUCOSE, CAPILLARY
Glucose-Capillary: 186 mg/dL — ABNORMAL HIGH (ref 70–99)
Glucose-Capillary: 187 mg/dL — ABNORMAL HIGH (ref 70–99)
Glucose-Capillary: 212 mg/dL — ABNORMAL HIGH (ref 70–99)
Glucose-Capillary: 228 mg/dL — ABNORMAL HIGH (ref 70–99)
Glucose-Capillary: 239 mg/dL — ABNORMAL HIGH (ref 70–99)
Glucose-Capillary: 253 mg/dL — ABNORMAL HIGH (ref 70–99)

## 2020-08-30 LAB — PHOSPHORUS: Phosphorus: 3.9 mg/dL (ref 2.5–4.6)

## 2020-08-30 LAB — MAGNESIUM: Magnesium: 2.1 mg/dL (ref 1.7–2.4)

## 2020-08-30 MED ORDER — ACETAMINOPHEN 500 MG PO TABS
1000.0000 mg | ORAL_TABLET | Freq: Three times a day (TID) | ORAL | Status: AC
  Administered 2020-08-30 – 2020-08-31 (×3): 1000 mg via ORAL
  Filled 2020-08-30 (×3): qty 2

## 2020-08-30 MED ORDER — PROSOURCE PLUS PO LIQD
30.0000 mL | Freq: Two times a day (BID) | ORAL | Status: DC
  Administered 2020-08-30 – 2020-09-11 (×23): 30 mL via ORAL
  Filled 2020-08-30 (×23): qty 30

## 2020-08-30 MED ORDER — HEPARIN (PORCINE) 25000 UT/250ML-% IV SOLN: 1500.0000 [IU]/h | INTRAVENOUS | Status: DC

## 2020-08-30 MED ORDER — POTASSIUM CHLORIDE 10 MEQ/100ML IV SOLN
10.0000 meq | INTRAVENOUS | Status: AC
  Administered 2020-08-30 (×3): 10 meq via INTRAVENOUS
  Filled 2020-08-30 (×3): qty 100

## 2020-08-30 MED ORDER — HEPARIN (PORCINE) 25000 UT/250ML-% IV SOLN
1650.0000 [IU]/h | INTRAVENOUS | Status: DC
  Administered 2020-08-30 – 2020-09-02 (×5): 1650 [IU]/h via INTRAVENOUS
  Filled 2020-08-30 (×5): qty 250

## 2020-08-30 MED ORDER — TRAVASOL 10 % IV SOLN
INTRAVENOUS | Status: AC
  Filled 2020-08-30: qty 900

## 2020-08-30 MED ORDER — INSULIN ASPART 100 UNIT/ML ~~LOC~~ SOLN
0.0000 [IU] | SUBCUTANEOUS | Status: DC
  Administered 2020-08-30: 11 [IU] via SUBCUTANEOUS
  Administered 2020-08-30: 4 [IU] via SUBCUTANEOUS
  Administered 2020-08-30: 7 [IU] via SUBCUTANEOUS
  Administered 2020-08-31: 11 [IU] via SUBCUTANEOUS
  Administered 2020-08-31: 20 [IU] via SUBCUTANEOUS
  Administered 2020-08-31: 15 [IU] via SUBCUTANEOUS
  Administered 2020-08-31: 20 [IU] via SUBCUTANEOUS
  Administered 2020-08-31: 15 [IU] via SUBCUTANEOUS
  Administered 2020-08-31: 20 [IU] via SUBCUTANEOUS
  Administered 2020-09-01: 4 [IU] via SUBCUTANEOUS
  Administered 2020-09-01: 15 [IU] via SUBCUTANEOUS
  Administered 2020-09-01: 11 [IU] via SUBCUTANEOUS
  Administered 2020-09-01 (×3): 7 [IU] via SUBCUTANEOUS
  Administered 2020-09-02: 4 [IU] via SUBCUTANEOUS
  Administered 2020-09-02: 7 [IU] via SUBCUTANEOUS
  Administered 2020-09-02 (×2): 4 [IU] via SUBCUTANEOUS
  Administered 2020-09-02: 7 [IU] via SUBCUTANEOUS
  Administered 2020-09-02: 3 [IU] via SUBCUTANEOUS
  Administered 2020-09-03: 7 [IU] via SUBCUTANEOUS
  Administered 2020-09-03: 3 [IU] via SUBCUTANEOUS

## 2020-08-30 MED ORDER — KETOROLAC TROMETHAMINE 15 MG/ML IJ SOLN
15.0000 mg | Freq: Once | INTRAMUSCULAR | Status: AC
  Administered 2020-08-30: 15 mg via INTRAVENOUS
  Filled 2020-08-30: qty 1

## 2020-08-30 MED ORDER — METHYLPREDNISOLONE SODIUM SUCC 125 MG IJ SOLR
60.0000 mg | Freq: Once | INTRAMUSCULAR | Status: AC
  Administered 2020-08-30: 60 mg via INTRAVENOUS
  Filled 2020-08-30: qty 2

## 2020-08-30 MED ORDER — ACETAMINOPHEN 500 MG PO TABS: 1000.0000 mg | ORAL_TABLET | Freq: Three times a day (TID) | ORAL | Status: DC

## 2020-08-30 MED ORDER — SODIUM CHLORIDE 0.9 % IV SOLN
2.0000 g | Freq: Two times a day (BID) | INTRAVENOUS | Status: AC
  Administered 2020-08-30 – 2020-09-05 (×14): 2 g via INTRAVENOUS
  Filled 2020-08-30 (×14): qty 2

## 2020-08-30 MED ORDER — KATE FARMS STANDARD 1.4 PO LIQD
325.0000 mL | Freq: Every day | ORAL | Status: DC
  Administered 2020-08-30 – 2020-09-07 (×6): 325 mL via ORAL
  Filled 2020-08-30 (×19): qty 325

## 2020-08-30 MED ORDER — POTASSIUM CHLORIDE 10 MEQ/100ML IV SOLN
INTRAVENOUS | Status: AC
  Administered 2020-08-30: 10 meq via INTRAVENOUS
  Filled 2020-08-30: qty 100

## 2020-08-30 MED ORDER — ACETAMINOPHEN 325 MG PO TABS
325.0000 mg | ORAL_TABLET | Freq: Once | ORAL | Status: AC
  Administered 2020-08-30: 325 mg via ORAL
  Filled 2020-08-30: qty 1

## 2020-08-30 MED ORDER — METHYLPREDNISOLONE SODIUM SUCC 40 MG IJ SOLR
40.0000 mg | Freq: Every day | INTRAMUSCULAR | Status: AC
  Administered 2020-08-31 – 2020-09-03 (×4): 40 mg via INTRAVENOUS
  Filled 2020-08-30 (×4): qty 1

## 2020-08-30 NOTE — Progress Notes (Signed)
ANTICOAGULATION CONSULT NOTE - Follow Up Consult  Pharmacy Consult for IV heparin (on apixaban PTA) Indication: history of DVT  Allergies  Allergen Reactions  . Hctz [Hydrochlorothiazide]     Hx of gout; uric acid 14.1 on 03/13/20 Acute gout L elbow 8/11-8/24/21    Patient Measurements: Height: 5\' 5"  (165.1 cm) Weight: 103.3 kg (227 lb 11.8 oz) IBW/kg (Calculated) : 61.5 Heparin Dosing Weight: 85.7 kg  Vital Signs: Temp: 99 F (37.2 C) (10/13 2153) Temp Source: Oral (10/13 2153) BP: 145/84 (10/13 2015) Pulse Rate: 117 (10/13 2015)  Labs: Recent Labs    08/28/20 0400 08/28/20 1450 08/29/20 0425 08/29/20 0425 08/30/20 0439 08/30/20 1412 08/30/20 2147  HGB 7.7*  --  7.6*  --  7.5*  --   --   HCT 25.0*  --  24.6*  --  23.8*  --   --   PLT 154  --  148*  --  146*  --   --   HEPARINUNFRC 0.21*   < > 0.39   < > 0.23* 0.26* 0.37  CREATININE 0.70  --  0.69  --  0.64  --   --    < > = values in this interval not displayed.    Estimated Creatinine Clearance: 108.6 mL/min (by C-G formula based on SCr of 0.64 mg/dL).  Assessment: 58 y/oM with PMH of DVT (07/03/2020) on Apixaban PTA admitted on 08/10/2020 with SIRS. PO meds held d/t colonic ileus. Pharmacy consulted for IV heparin dosing while Apixaban on hold.   08/30/20 10:22 PM   HL = 0.37 (therapeutic) following rate increase to 1650 units/hr   CBC: Hgb low but stable Pltc WNL  No bleeding or infusion issues noted  Goal of Therapy:  Heparin level 0.3-0.7 units/ml Monitor platelets by anticoagulation protocol: Yes   Plan:   Continue heparin infusion @ 1650 units/hr  Daily heparin level and CBC  Monitor closely for signs and symptoms of bleeding  F/u ability to resume PO Eliquis  Leone Haven, PharmD 08/30/2020, 10:22 PM

## 2020-08-30 NOTE — Progress Notes (Signed)
PROGRESS NOTE    [name]  GNF:[id number] DOB: 08/26/1961 DOA: 08/10/2020 PCP: Charlott Rakes, MD    Chief Complaint  Patient presents with  . Leg Swelling  . Medical Clearance    rehab placement     Brief Narrative:  [name] is a 59 yo male with past medical history significant of type 2 diabetes, chronic gout who was recently hospitalized 8/11-8/24 with AKI, at that time found to have left upper extremity DVT in addition to polyarticular gout.  He now comes in with sepsis, UTI, bowel obstruction. He has been on IV heparin due to LUE DVT as well as TPN due to ileus.     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  Tolerating full liquid diet at this time.  Patient stated had a large bowel movement on 08/15/2020, passing flatus.  Patient stated had a large bowel movement (08/18/2020) however per RN patient with mucus.  Patient also with some nausea and emesis on 08/18/2020. Per RN patient has not had any significant bowel movement since 08/15/2020. Patient stated ambulated in hallways on 08/15/2020 with therapy. Abdominal films done with stable diffuse colonic dilatation is noted most consistent with colonic ileus/adynamic ileus with progression and colonic  pneumatosis..  CT abdomen and pelvis done 08/14/2020 with findings consistent with colonic ileus, pneumatosis of the ascending and hepatic flexure of the colon.  No associated colonic wall thickening or portal venous gas.  Normal small bowel without obstruction.  Hepatic steatosis.  Bilateral renal parenchymal atrophy with lobular contours.  Mild bladder wall thickening.  Patient with no significant change with abdominal distention.  Abdominal films with persistent gaseous distention of large and small bowel loops.  Patient seen by GI and NG tube ordered however unable to be placed per RN.  NG tube placed by GI under guidewire.  Per RN after NG tube placed patient with some relief of abdominal discomfort.  General surgery was following and initially recommended mobilization and GI decompression.  General surgery ordered neostigmine x 1 and per RN patient noted to have watery loose stools after neostigmine was given.  Rectal tube has been ordered per GI.  Repeat abdominal films with unchanged gaseous distention of small bowel and colon compatible with ileus.    NG tube has subsequently been discontinued.  PICC line placed and patient was started on TPN.    General surgery was following but signed off 08/22/2020.  Patient with TPN and once tolerating oral intake will start to wean down TPN.  Patient was placed on a full liquid diet per Dr. Benson Norway who had recommended advancement to a soft diet however patient noted to have some abdominal distention and nausea when placed on a soft diet and was placed back on a full liquid diet.  Continue supportive care.  We  will try to advance diet tomorrow to a soft diet to see whether patient is able to better tolerate.  NG tube with output of 650 cc over the past 24 hours.  Keep potassium > 4, magnesium > 2.  Currently n.p.o.  Supportive care.  PICC line to be placed today.  TPN to start today after PICC line placed.  GI surgery following.  2.  Polyarticular gout with  osteoarthritis It is noted that patient had previous work-up with rheumatoid factor and ANA which were negative.  Steroids on hold.  Allopurinol discontinued on admission.  Colchicine on hold.  Per RN patient with complaints of exquisite right hand pain concerning for an acute gouty flare.  Placed on IV Solu-Medrol 60 mg x 1 today, then 40 mg IV daily x4 more days.  Will need outpatient follow-up with rheumatology as previously scheduled.  3.  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  Patient noted to have been on 70/30 insulin 50 units twice daily.  CBG at 253 this morning.  At least 7 units daily, sliding scale insulin.  Patient on TPN and insulin being adjusted per pharmacy.  May need to increase insulin requirements as patient placed on IV steroids.  Follow.  4.  Hypokalemia/hypomagnesemia Potassium currently at 3.5.  Magnesium at 2.1.  Phosphorus at 3.9.  Electrolytes being repleted per pharmacy as patient on TPN.    5.  Left upper extremity DVT Was on apixaban twice daily.  Patient currently n.p.o. due to colonic ileus.  IV heparin for anticoagulation.  Once on a diet consistently will resume apixaban.  Follow.   6.  Iron deficiency anemia Status post IV Feraheme 08/12/2020.    Hemoglobin currently stable at 7.5.  Likely dilutional effect.  Once tolerating a diet will need to resume oral iron supplementation.  Transfusion threshold hemoglobin < 7.  7.  Sepsis secondary to E. coli UTI, Pseudomonas aeruginosa, POA Patient noted to have urine cultures are growing 100,000 colonies of E. coli was initially on Keflex and subsequently transitioned to IV Ancef as patient noted to be n.p.o.  Patient finished a full course of antibiotics on 08/19/2020.  Patient however was having fevers on 08/26/2020 and repeat urinalysis concerning for UTI.  Patient started on IV Rocephin 08/26/2020, blood cultures done 08/26/2020 with no growth to date.  Urine cultures were  positive for E. coli as well as Pseudomonas aeruginosa.  Patient noted to have a fever again on 08/29/2020 with tachycardia and due to concerns for possible pneumonia azithromycin added to regimen of IV Rocephin.  Cultures did show Pseudomonas and as such IV Rocephin has been discontinued and patient started on IV cefepime 08/30/2020.  Follow.  8.  Prior bilateral knee surgeries PT/OT.  SNF placement.  9.  Chronic kidney disease stage II Stable.  Follow.   10.  Metabolic acidosis Patient noted to be acidotic.  Concern for abdominal ischemia as patient with significant colonic ileus with no significant improvement.  Lactic acid level within normal limits.  Acidosis resolved with bicarb drip.  Bicarb drip has been discontinued.  Follow.  11.  Nutrition Due to patient's colonic distention/ileus patient has been n.p.o. for several days.    Patient noted to have had NG tube and rectal tube.  PICC line subsequently placed and patient started on TPN per pharmacy.  Patient currently on a full liquid diet which is tolerating and if continued improvement could rechallenge again with soft diet tomorrow.  12.  Severe onchogryphosis  13.  QTC prolongation Per RN patient with QTC prolongation of 550.  Repeat EKG 08/22/2020 with resolution of QTC prolongation.  Keep potassium > 4, magnesium > 2.     DVT prophylaxis: Heparin Code Status: Full Family Communication: Updated patient.  No family at bedside. Disposition:   Status is: Inpatient    Dispo:  Patient From:    Planned Disposition: [place]  Expected discharge date: 09/04/20  Medically stable for discharge:  No        Consultants:   Gastroenterology: Dr. Collene Mares 08/14/2020  General surgery: Dr. Hassell Done 08/14/2020  Procedures:   CT angiogram chest 08/10/2020  CT abdomen and pelvis 08/14/2020  2D echo 08/11/2020  Abdominal films 08/18/2020, 08/17/2020, 08/16/2020, 08/14/2020  Antimicrobials:  Anti-infectives  (From admission, onward)   Start     Dose/Rate Route Frequency Ordered Stop   08/30/20 1100  ceFEPIme (MAXIPIME) 2 g in sodium chloride 0.9 % 100 mL IVPB        2 g 200 mL/hr over 30 Minutes Intravenous Every 12 hours 08/30/20 1003     08/29/20 1030  azithromycin (ZITHROMAX) 500 mg in sodium chloride 0.9 % 250 mL IVPB        500 mg 250 mL/hr over 60 Minutes Intravenous Every 24 hours 08/29/20 0940     08/26/20 2300  cefTRIAXone (ROCEPHIN) 1 g in sodium chloride 0.9 % 100 mL IVPB  Status:  Discontinued        1 g 200 mL/hr over 30 Minutes Intravenous Every 24 hours 08/26/20 2209 08/30/20 0956   08/15/20 1600  ceFAZolin (ANCEF) IVPB 1 g/50 mL premix        1 g 100 mL/hr over 30 Minutes Intravenous Every 8 hours 08/15/20 1429 08/19/20 2243   08/14/20 1730  cefTRIAXone (ROCEPHIN) 1 g in sodium chloride 0.9 % 100 mL IVPB  Status:  Discontinued        1 g 200 mL/hr over 30 Minutes Intravenous Every 24 hours 08/14/20 1647 08/15/20 1429   08/12/20 1230  cephALEXin (KEFLEX) capsule 500 mg  Status:  Discontinued        500 mg Oral Every 12 hours 08/12/20 1139 08/14/20 1647        Subjective: Patient sitting up in chair.  Just finished working with physical therapy.  Denies any chest pain or shortness of breath.  Still with abdominal distention however denies any significant abdominal pain.  Patient with still loose stools.  Tolerating current full liquid diet.  It was noted per RN that patient with some complaints of right hand pain exquisitely.  Per RN patient's diet was advanced to a soft diet yesterday however patient with significant nausea when started on a soft diet and placed back on a full liquid diet.  Patient noted with fevers overnight.  Objective: Vitals:   08/30/20 0125 08/30/20 0500 08/30/20 0900 08/30/20 0954  BP: (!) 144/83 (!) 146/81 (!) 168/99 (!) 180/96  Pulse: (!) 113 (!) 102 (!) 107 (!) 114  Resp: (!) 22 (!) 22 20 (!) 21  Temp: (!) 100.6 F (38.1 C) 99.2 F (37.3 C)  100.2 F (37.9 C) 99.8 F (37.7 C)  TempSrc: Oral Oral Oral Oral  SpO2:  97% 99% 98%  Weight:      Height:        Intake/Output Summary (Last 24 hours) at 08/30/2020 1109 Last data filed at 08/30/2020 1000 Gross per 24 hour  Intake 3569.5 ml  Output 2375 ml  Net 1194.5 ml   Danley Danker  Weights   08/21/20 0500 08/22/20 0509 08/28/20 1820  Weight: 108.4 kg 105.3 kg 103.3 kg    Examination:  General exam: Appears calm and comfortable  Respiratory system: Clear to auscultation. Respiratory effort normal. Cardiovascular system: Tachycardia. No JVD, murmurs, rubs, gallops or clicks. No pedal edema. Gastrointestinal system: Abdomen is distended, soft, nontender to palpation, positive bowel sounds.  No rebound.  No guarding.  Central nervous system: Alert and oriented. No focal neurological deficits. Extremities: Symmetric 5 x 5 power. Skin: No rashes, lesions or ulcers Psychiatry: Judgement and insight appear normal. Mood & affect appropriate.     Data Reviewed: I have personally reviewed following labs and imaging studies  CBC: Recent Labs  Lab 08/26/20 0415 08/27/20 0311 08/28/20 0400 08/29/20 0425 08/30/20 0439  WBC 7.6 8.3 7.7 9.0 9.7  NEUTROABS  --   --  4.7  --   --   HGB 8.0* 7.9* 7.7* 7.6* 7.5*  HCT 26.4* 25.7* 25.0* 24.6* 23.8*  MCV 79.3* 80.1 79.6* 78.6* 78.5*  PLT 183 165 154 148* 146*    Basic Metabolic Panel: Recent Labs  Lab 08/25/20 0530 08/25/20 0530 08/26/20 0415 08/27/20 0311 08/28/20 0400 08/29/20 0425 08/30/20 0439  NA 134*   < > 135 136 134* 132* 134*  K 4.0   < > 4.1 3.9 4.3 4.6 3.5  CL 104   < > 104 103 104 104 104  CO2 23   < > 23 23 22  21* 22  GLUCOSE 218*   < > 170* 192* 197* 294* 193*  BUN 12   < > 14 17 15 15 15   CREATININE 0.70   < > 0.69 0.76 0.70 0.69 0.64  CALCIUM 8.4*   < > 8.4* 8.5* 8.6* 8.3* 8.2*  MG 1.8   < > 1.9 2.1 1.8 1.8 2.1  PHOS 3.5  --  3.8  --  3.5 3.6 3.9   < > = values in this interval not displayed.     GFR: Estimated Creatinine Clearance: 108.6 mL/min (by C-G formula based on SCr of 0.64 mg/dL).  Liver Function Tests: Recent Labs  Lab 08/24/20 0411 08/26/20 0415 08/28/20 0400 08/29/20 0425 08/30/20 0439  AST 22 24 30  33 36  ALT 21 23 33 41 47*  ALKPHOS 74 71 84 95 110  BILITOT 0.6 0.4 0.7 0.4 0.6  PROT 5.6* 5.6* 5.9* 6.0* 5.9*  ALBUMIN 2.5* 2.6* 2.5* 2.5* 2.4*    CBG: Recent Labs  Lab 08/29/20 1115 08/29/20 1715 08/30/20 0015 08/30/20 0620 08/30/20 0826  GLUCAP 254* 236* 228* 186* 212*     Recent Results (from the past 240 hour(s))  Culture, blood (routine x 2)     Status: None (Preliminary result)   Collection Time: 08/26/20  2:27 PM   Specimen: BLOOD  Result Value Ref Range Status   Specimen Description   Final    BLOOD LEFT FOOT Performed at [facility], [place] [address] [place], [place] [address]    Special Requests   Final    BOTTLES DRAWN AEROBIC AND ANAEROBIC Blood Culture adequate volume Performed at [facility] [address] [place], [place] [address]    Culture   Final    NO GROWTH 4 DAYS Performed at [facility], [place] [address], [place], [place] [address]    Report Status PENDING  Incomplete  Culture, Urine     Status: Abnormal (Preliminary result)   Collection Time: 08/26/20  5:44 PM  Specimen: Urine, Clean Catch  Result Value Ref Range Status   Specimen Description   Final    URINE, CLEAN CATCH Performed at [facility], [place] [address] [place], [place] [address]    Special Requests   Final    NONE Performed at [facility], [place] [address], [place],  [address]    Culture (A)  Final    >=100,000 COLONIES/mL ESCHERICHIA COLI >=100,000 COLONIES/mL PSEUDOMONAS AERUGINOSA SUSCEPTIBILITIES TO FOLLOW Performed at [place] [address] [place], [place] [address]    Report Status PENDING  Incomplete   Organism ID, Bacteria  ESCHERICHIA COLI (A)  Final      Susceptibility   Escherichia coli - MIC*    AMPICILLIN 4 SENSITIVE Sensitive     CEFAZOLIN <=4 SENSITIVE Sensitive     CEFTRIAXONE <=0.25 SENSITIVE Sensitive     CIPROFLOXACIN <=0.25 SENSITIVE Sensitive     GENTAMICIN <=1 SENSITIVE Sensitive     IMIPENEM <=0.25 SENSITIVE Sensitive     NITROFURANTOIN <=16 SENSITIVE Sensitive     TRIMETH/SULFA <=20 SENSITIVE Sensitive     AMPICILLIN/SULBACTAM <=2 SENSITIVE Sensitive     PIP/TAZO <=4 SENSITIVE Sensitive     * >=100,000 COLONIES/mL ESCHERICHIA COLI         Radiology Studies: DG Abd 1 View  Result Date: 08/29/2020 CLINICAL DATA:  Fever.  Follow-up stent bowel. EXAM: ABDOMEN - 1 VIEW COMPARISON:  Abdominal radiographs 08/26/2020, 08/22/2020, and 08/21/2020. FINDINGS: Persistent dilated loops of small bowel are present. There is some gas in the colon. No definite free air is present. Decubitus views would be more helpful for assessment of fluid levels or free air. Bowel distention is increasing. IMPRESSION: 1. Persistent dilated loops of small bowel, compatible with small bowel obstruction. 2. No definite free air. Electronically Signed   By: [place] Morelle M.D.   On: 08/29/2020 10:40   DG CHEST PORT 1 VIEW  Result Date: 08/29/2020 CLINICAL DATA:  59 year old male with history of fever. Distended bowel. EXAM: PORTABLE CHEST 1 VIEW COMPARISON:  Chest x-ray 08/26/2020. FINDINGS: There is a right upper extremity PICC with tip terminating in the distal superior vena cava. Lung volumes are low. No consolidative airspace disease. No pleural effusions. No pneumothorax. No pulmonary nodule or mass noted. Pulmonary vasculature and the cardiomediastinal silhouette are within normal limits. Visualized portions of the upper abdomen demonstrate gaseous distension in the visualized bowel. IMPRESSION: 1. Low lung volumes without radiographic evidence of acute cardiopulmonary disease. 2. Gaseous distension of the bowel  noted beneath the diaphragm. Electronically Signed   By: Vinnie Langton M.D.   On: 08/29/2020 10:44        Scheduled Meds: . (feeding supplement) PROSource Plus  30 mL Oral BID BM  . Chlorhexidine Gluconate Cloth  6 each Topical Daily  . feeding supplement ([employer] STANDARD 1.4)  325 mL Oral Daily  . insulin aspart  0-20 Units Subcutaneous Q4H  . insulin glargine  7 Units Subcutaneous QHS  . metoCLOPramide (REGLAN) injection  5 mg Intravenous Q8H  . metoprolol tartrate  7.5 mg Intravenous Q6H  . polyethylene glycol  17 g Oral Daily  . sodium chloride flush  10-40 mL Intracatheter Q12H   Continuous Infusions: . sodium chloride 10 mL/hr at 08/30/20 0200  . azithromycin 500 mg (08/30/20 1053)  . ceFEPime (MAXIPIME) IV    . heparin 1,500 Units/hr (08/30/20 0743)  . potassium chloride 10 mEq (08/30/20 0950)  . TPN ADULT (ION) 75 mL/hr  at 08/30/20 0200  . TPN ADULT (ION)       LOS: 18 days    Time spent: 35 minutes    Irine Seal, MD [facility]   To contact the attending provider between 7A-7P or the covering provider during after hours 7P-7A, please log into the web site [email or web address] and access using universal [place] password for that web site. If you do not have the password, please call the hospital operator.  08/30/2020, 11:09 AM

## 2020-08-30 NOTE — Progress Notes (Addendum)
Nutrition Follow-up  DOCUMENTATION CODES:   Obesity unspecified  INTERVENTION:  - will order [name] 1.4 po once/day, each supplement provides 455 kcal and 20 grams protein.  - will order 30 ml Prosource Plus BID, each supplement provides 100 kcal and 15 grams protein.  - continue to advance diet as medically feasible. - TPN per Pharmacist.   NUTRITION DIAGNOSIS:   Inadequate oral intake related to acute illness as evidenced by other (comment) (FLD unable to provide adequate needs). -ongoing, revised  GOAL:   Patient will meet greater than or equal to 90% of their needs -met with minimal intakes and TPN regimen  MONITOR:   PO intake, Supplement acceptance, Diet advancement, Labs, Weight trends, Other (Comment) (TPN regimen)  ASSESSMENT:   59 year old male with past medical history of polyarticular gout, insulin-dependent diabetes mellitus type 2, iron deficiency anemia, hypertension and recent diagnosis of left upper extremity DVT (06/2020) presenting with generalized malaise and weakness.  Significant Events: 9/24- admission 10/1- NGT placed 10/5- triple lumen PICC placed in R brachial 10/6- TPN started at 65 ml/hr 10/7- TPN increased to 90 ml/hr; CLD ordered 10/9- NGT removed 10/11- TPN decreased to 75 ml/hr; diet advanced to FLD   Patient ate 75% of lunch and 50% of dinner yesterday which provided a total of 359 kcal and 8 grams protein. He is receiving custom TPN at new goal rate of 75 ml/hr which is providing 1818 kcal and 90 grams of protein.  Able to talk with Pharmacist about patient.  Weight has been fluctuating throughout hospitalization. Weight on 10/5 was 232 lb and when he was re-weighed on 10/11 he weighed 228 lb.   Generalized moderate pitting edema documented in the flow sheet.   Per notes: - colonic and small bowel ileus - repeat KUB showed persistent dilated small bowel - LUE DVT - gout with osteoarthritis - likely plan at time of d/c is for  SNF    Labs reviewed; HgbA1c: 7.1%, CBGs: 228, 186, 212 mg/dl, Na: 134 mmol/l, Ca: 8.2 mg/dl. Medications reviewed; sliding scale novolog, 7 units lantus/day, 2 g IV Mg sulfate x1 run 10/12, 5 mg reglan TID, 17 g miralax/day, 10 mEq IV KCl x4 runs 10/13.   Diet Order:   Diet Order            Diet full liquid Room service appropriate? Yes; Fluid consistency: Thin  Diet effective now                 EDUCATION NEEDS:   No education needs have been identified at this time  Skin:  Skin Assessment: Reviewed RN Assessment  Last BM:  10/13 (type 7)  Height:   Ht Readings from Last 1 Encounters:  08/11/20 '5\' 5"'  (1.651 m)    Weight:   Wt Readings from Last 1 Encounters:  08/28/20 103.3 kg    Estimated Nutritional Needs:   Kcal:  1850-2050  Protein:  95-110g  Fluid:  2L/day     Jarome Matin, MS, RD, LDN, CNSC Inpatient Clinical Dietitian RD pager # available in AMION  After hours/weekend pager # available in [facility]

## 2020-08-30 NOTE — Progress Notes (Signed)
ANTICOAGULATION CONSULT NOTE - Follow Up Consult  Pharmacy Consult for IV heparin (on apixaban PTA) Indication: history of DVT  Allergies  Allergen Reactions  . Hctz [Hydrochlorothiazide]     Hx of gout; uric acid 14.1 on 03/13/20 Acute gout L elbow 8/11-8/24/21    Patient Measurements: Height: 5\' 5"  (165.1 cm) Weight: 103.3 kg (227 lb 11.8 oz) IBW/kg (Calculated) : 61.5 Heparin Dosing Weight: 85.7 kg  Vital Signs: Temp: 99.2 F (37.3 C) (10/13 0500) Temp Source: Oral (10/13 0500) BP: 146/81 (10/13 0500) Pulse Rate: 102 (10/13 0500)  Labs: Recent Labs    08/28/20 0400 08/28/20 0400 08/28/20 1450 08/29/20 0425 08/30/20 0439  HGB 7.7*   < >  --  7.6* 7.5*  HCT 25.0*  --   --  24.6* 23.8*  PLT 154  --   --  148* 146*  HEPARINUNFRC 0.21*   < > 0.48 0.39 0.23*  CREATININE 0.70  --   --  0.69 0.64   < > = values in this interval not displayed.    Estimated Creatinine Clearance: 108.6 mL/min (by C-G formula based on SCr of 0.64 mg/dL).  Assessment: 22 y/oM with PMH of DVT (07/03/2020) on Apixaban PTA admitted on 08/10/2020 with SIRS. PO meds held d/t colonic ileus. Pharmacy consulted for IV heparin dosing while Apixaban on hold.   08/30/20 6:49 AM   HL = 0.23 (subtherapeutic) today on heparin 1400 units/hr   CBC: Hgb low but stable Pltc WNL  No bleeding or infusion issues documented  Goal of Therapy:  Heparin level 0.3-0.7 units/ml Monitor platelets by anticoagulation protocol: Yes   Plan:   Increase heparin infusion to 1500 units/hr  Check heparin level 6 hr after rate increase  Daily heparin level and CBC  Monitor closely for signs and symptoms of bleeding  F/u ability to resume PO Eliquis  [name], PharmD 08/30/2020, 6:49 AM

## 2020-08-30 NOTE — Progress Notes (Signed)
UNASSIGNED PATIENT Subjective: Since I last evaluated the patient, there has not been much change in his overall condition. He has been having a lot of joints pains today.  Objective: Vital signs in last 24 hours: Temp:  [98.6 F (37 C)-101.7 F (38.7 C)] 99.3 F (37.4 C) (10/13 1358) Pulse Rate:  [101-123] 115 (10/13 1358) Resp:  [20-24] 23 (10/13 1358) BP: (137-198)/(71-99) 137/82 (10/13 1358) SpO2:  [95 %-100 %] 96 % (10/13 1358) Last BM Date: 08/30/20  Intake/Output from previous day: 10/12 0701 - 10/13 0700 In: 3213 [P.O.:720; I.V.:2093; IV Piggyback:400.1] Out: 1975 [QQPYP:9509] Intake/Output this shift: Total I/O In: 1771.3 [P.O.:960; I.V.:694.8; IV Piggyback:116.5] Out: 400 [Urine:400]  General appearance: alert, cooperative, fatigued, morbidly obese and pale Resp: clear to auscultation bilaterally Cardio: regular rate and rhythm, S1, S2 normal, no murmur, click, rub or gallop GI: distended, non-tender; hypoactive bowel sounds; no masses,  no organomegaly Extremities: extremities normal, atraumatic, no cyanosis or edema  Lab Results: Recent Labs    08/28/20 0400 08/29/20 0425 08/30/20 0439  WBC 7.7 9.0 9.7  HGB 7.7* 7.6* 7.5*  HCT 25.0* 24.6* 23.8*  PLT 154 148* 146*   BMET Recent Labs    08/28/20 0400 08/29/20 0425 08/30/20 0439  NA 134* 132* 134*  K 4.3 4.6 3.5  CL 104 104 104  CO2 22 21* 22  GLUCOSE 197* 294* 193*  BUN 15 15 15   CREATININE 0.70 0.69 0.64  CALCIUM 8.6* 8.3* 8.2*   LFT Recent Labs    08/30/20 0439  PROT 5.9*  ALBUMIN 2.4*  AST 36  ALT 47*  ALKPHOS 110  BILITOT 0.6   Studies/Results: DG Abd 1 View  Result Date: 08/29/2020 CLINICAL DATA:  Fever.  Follow-up stent bowel. EXAM: ABDOMEN - 1 VIEW COMPARISON:  Abdominal radiographs 08/26/2020, 08/22/2020, and 08/21/2020. FINDINGS: Persistent dilated loops of small bowel are present. There is some gas in the colon. No definite free air is present. Decubitus views would be more  helpful for assessment of fluid levels or free air. Bowel distention is increasing. IMPRESSION: 1. Persistent dilated loops of small bowel, compatible with small bowel obstruction. 2. No definite free air. Electronically Signed   By: San Morelle M.D.   On: 08/29/2020 10:40   DG CHEST PORT 1 VIEW  Result Date: 08/29/2020 CLINICAL DATA:  59 year old male with history of fever. Distended bowel. EXAM: PORTABLE CHEST 1 VIEW COMPARISON:  Chest x-ray 08/26/2020. FINDINGS: There is a right upper extremity PICC with tip terminating in the distal superior vena cava. Lung volumes are low. No consolidative airspace disease. No pleural effusions. No pneumothorax. No pulmonary nodule or mass noted. Pulmonary vasculature and the cardiomediastinal silhouette are within normal limits. Visualized portions of the upper abdomen demonstrate gaseous distension in the visualized bowel. IMPRESSION: 1. Low lung volumes without radiographic evidence of acute cardiopulmonary disease. 2. Gaseous distension of the bowel noted beneath the diaphragm. Electronically Signed   By: Vinnie Langton M.D.   On: 08/29/2020 10:44   Medications: I have reviewed the patient's current medications.  Assessment/Plan: Assessment/Plan: 1) Colonic and small bowel ileus-on clear liquids with minimal improvement.  Encourage ambulation and correct electrolytes as best as possible.  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 4) Left upper extremity DVT on heparin. 5) Iron deficiency anemia   LOS: 18 days   Juanita Craver 08/30/2020, 4:23 PM

## 2020-08-30 NOTE — Progress Notes (Signed)
Pharmacy Antibiotic Note  [name] is a 59 y.o. male admitted on 08/10/2020 with bowel obstruction, sepsis and UTI.  Pharmacy has been consulted for cefepime dosing for most recent urine culture positive for E.coli and Pseudomonas.  Plan: Cefepime 2g IV q12h No dose adjustments anticipated; Pharmacy will sign off progress note writing and follow peripherally  Height: 5\' 5"  (165.1 cm) Weight: 103.3 kg (227 lb 11.8 oz) IBW/kg (Calculated) : 61.5  Temp (24hrs), Avg:99.8 F (37.7 C), Min:98.2 F (36.8 C), Max:101.5 F (38.6 C)  Recent Labs  Lab 08/26/20 0415 08/27/20 0311 08/28/20 0400 08/29/20 0425 08/30/20 0439  WBC 7.6 8.3 7.7 9.0 9.7  CREATININE 0.69 0.76 0.70 0.69 0.64    Estimated Creatinine Clearance: 108.6 mL/min (by C-G formula based on SCr of 0.64 mg/dL).    Allergies  Allergen Reactions  . Hctz [Hydrochlorothiazide]     Hx of gout; uric acid 14.1 on 03/13/20 Acute gout L elbow 8/11-8/24/21    Antimicrobials this admission: 9/27 CTX 9/28 Cefazolin 10/2 10/9 Rocephin >> 10/13 10/12 Azithro >> 10/13 Cefepime >>  Microbiology results: 9/23 BCx: NGF 9/24 I/O cath: >100k E.coli (pansensitive) 10/9 BCx: NGF 10/9 UCx: >100k E.coli (pansensitive), Pseudomonas (pending)  Thank you for allowing pharmacy to be a part of this patient's care.  Peggyann Juba, PharmD, BCPS Pharmacy: [phone] 08/30/2020 10:06 AM

## 2020-08-30 NOTE — Progress Notes (Signed)
PHARMACY - TOTAL PARENTERAL NUTRITION CONSULT NOTE   Indication: Prolonged ileus  Patient Measurements: Height: 5\' 5"  (165.1 cm) Weight: 103.3 kg (227 lb 11.8 oz) IBW/kg (Calculated) : 61.5 TPN AdjBW (KG): 72.7 Body mass index is 37.9 kg/m.  Assessment:  59 yo male with PMH gout, OA, DM2, anemia admitted with upper extremity DVT and development of colonic ileus. GI following, recommend NGT to LIWS, s/p neostigmine 10/1, no surgical indications at this point unless he perforates or has ischemia, have signed off 10/4.   Glucose / Insulin: Hx DM, A1c 7.18 Jun 2020 - CBGs (236-254):  with 23 units insulin in TPN and 7 units Lantus daily; most recent = 186 with new TPN bag - 29 units of resistant SSI given yesterday  Electrolytes: Na still slightly low; K below goal; Mg now WNL after IV replacement x 2 days - goal K >/= 4.0, Mg >/= 2.0 with ileus Renal: Hx CKD3; SCr, BUN stable WNL; UOP remains adequate Hepatic: LFTs WNL; tbili stable WNL; albumin remains low but stable Prealbumin: low but slowly improving TG: stable WNL I/O: NGT removed 10/8 - MIVF: NS at [facility].  Remains on IV reglan q8h GI Imaging: - 9/27 CT abd/pelvis: findings consistent with colonic ileus, pneumatosis of the ascending and hepatic flexure of the colon. - 10/3 AXR:  stable diffuse colonic dilatation is noted most consistent with colonic ileus/adynamic ileus with progression and colonic pneumatosis. - 10/9 AXR: Unchanged gaseous dilation of loops of large and small bowel. - 10/12 AXR: Persistent dilated loops of small bowel, compatible with small bowel obstruction. Surgeries / Procedures:   Central access: PICC TPN start date: 10/5  Nutritional Goals (per RD recommendation on 10/6): Kcal:  1850-2050 Protein:  95-110g Fluid:  2L/day  Goal TPN rate is 90 mL/hr (provides 108 g of protein and 2181 kcals per day)  Current Nutrition:  Diet advanced to FLD 10/12 Per RN taking about 50% of meals with FLD  Plan:    KCL 26meq IV x 4 runs  Today at 1800:  Continue TPN at 75 ml/hr (~83% support with diet advancement)  Electrolytes in TPN: increase Na  125 mEq/L of Na  50 mEq/L of K  5 mEq/L of Ca  12 mEq/L of Mg  15 mmol/L of Phos   Cl:Ac ratio 1:2  Standard MVI and trace elements to TPN  SSI to resistant scale with increased frequency q4h CBG checks   Regular insulin in TPN to 23 units; continue Lantus 7 units qhs  Continue M[facility] at [facility]   Monitor TPN labs on Mon/Thurs  Peggyann Juba, PharmD, [place]: [phone] 08/30/2020, 7:25 AM

## 2020-08-30 NOTE — Progress Notes (Signed)
ANTICOAGULATION CONSULT NOTE - Follow Up Consult  Pharmacy Consult for IV heparin (on apixaban PTA) Indication: history of DVT  Allergies  Allergen Reactions  . Hctz [Hydrochlorothiazide]     Hx of gout; uric acid 14.1 on 03/13/20 Acute gout L elbow 8/11-8/24/21    Patient Measurements: Height: 5\' 5"  (165.1 cm) Weight: 103.3 kg (227 lb 11.8 oz) IBW/kg (Calculated) : 61.5 Heparin Dosing Weight: 85.7 kg  Vital Signs: Temp: 99.3 F (37.4 C) (10/13 1358) Temp Source: Oral (10/13 1244) BP: 137/82 (10/13 1358) Pulse Rate: 115 (10/13 1358)  Labs: Recent Labs    08/28/20 0400 08/28/20 1450 08/29/20 0425 08/30/20 0439 08/30/20 1412  HGB 7.7*  --  7.6* 7.5*  --   HCT 25.0*  --  24.6* 23.8*  --   PLT 154  --  148* 146*  --   HEPARINUNFRC 0.21*   < > 0.39 0.23* 0.26*  CREATININE 0.70  --  0.69 0.64  --    < > = values in this interval not displayed.    Estimated Creatinine Clearance: 108.6 mL/min (by C-G formula based on SCr of 0.64 mg/dL).  Assessment: 80 y/oM with PMH of DVT (07/03/2020) on Apixaban PTA admitted on 08/10/2020 with SIRS. PO meds held d/t colonic ileus. Pharmacy consulted for IV heparin dosing while Apixaban on hold.   08/30/20 3:23 PM   HL = 0.26 (subtherapeutic) despite rate increase to 1500 units/hr   CBC: Hgb low but stable Pltc WNL  No bleeding or infusion issues per discussion with RN  Goal of Therapy:  Heparin level 0.3-0.7 units/ml Monitor platelets by anticoagulation protocol: Yes   Plan:   Increase heparin infusion to 1650 units/hr  Check heparin level 6 hr after rate increase  Daily heparin level and CBC  Monitor closely for signs and symptoms of bleeding  F/u ability to resume PO Eliquis  Peggyann Juba, PharmD, BCPS Pharmacy: [phone] 08/30/2020, 3:23 PM

## 2020-08-30 NOTE — Progress Notes (Signed)
Physical Therapy Treatment Patient Details Name: [name] MRN: [id number] DOB: [birth year] Today's Date: 08/30/2020    History of Present Illness 59 yo male admitted to [facility] on 9/24 with weakness, joint pain, difficulty urinating. Pt meeting SIRS criteria, possibly secondary to urinary infection. NGT placed 10/1 for ileus. Now clamped. PMHx significant for chronic gouty arthritis of multiple joints, CKD II, IDDM, HTN, HLD, and chronic anemia. Admitted to [facility] 8/11-8/24 for AKI, LUE DVT, and joint pain, d/c SNF.    PT Comments    Pt progressing today, requiring decr assist overall however limited by incr HR. Resting rate  In 110s, HR 152 with standing, seated rest HR down to 120s, HR Max 162 with amb, returned to 120s at rest. RN aware. Continue to recommend SNF post acute. Will continue to follow   Follow Up Recommendations  SNF     Equipment Recommendations  None recommended by PT    Recommendations for Other Services       Precautions / Restrictions Precautions Precautions: Fall Restrictions Weight Bearing Restrictions: No    Mobility  Bed Mobility               General bed mobility comments: OOB in recliner  Transfers Overall transfer level: Needs assistance Equipment used: Bilateral platform walker;Ambulation equipment used Engineer, water) Transfers: Sit to/from Stand Sit to Stand: Min assist;+2 physical assistance;+2 safety/equipment;Mod assist         General transfer comment: sit to stands repeated for strengthening and d/t incr HR.  verbal cues for hand placement and  control of descent   Ambulation/Gait Ambulation/Gait assistance: Min assist;+2 safety/equipment;Mod assist Gait Distance (Feet): 22 Feet Assistive device: Bilateral platform walker (EVA) Gait Pattern/deviations: Step-through pattern;Decreased stride length;Wide base of support;Trunk flexed     General Gait Details: used EVA walker d/t significant RUE pain/unable to grip with R hand.  pt  requiring min to mod assist for balance and safety, chair follow for safety also, distance limited by elevated HR   Stairs             Wheelchair Mobility    Modified Rankin (Stroke Patients Only)       Balance   Sitting-balance support: Feet supported;No upper extremity supported Sitting balance-Leahy Scale: Fair     Standing balance support: Bilateral upper extremity supported;During functional activity Standing balance-Leahy Scale: Poor Standing balance comment: reliant on UEs and external assist                             Cognition Arousal/Alertness: Awake/alert Behavior During Therapy: WFL for tasks assessed/performed;Flat affect Overall Cognitive Status: Impaired/Different from baseline                         Following Commands: Follows one step commands with increased time     Problem Solving: Decreased initiation;Requires verbal cues        Exercises General Exercises - Lower Extremity Ankle Circles/Pumps: AROM;Both;10 reps Long Arc Quad: AROM;Both;5 reps;Seated    General Comments        Pertinent Vitals/Pain Pain Assessment: Faces Faces Pain Scale: Hurts little more Pain Location: R UE  Pain Descriptors / Indicators: Grimacing Pain Intervention(s): Limited activity within patient's tolerance;Monitored during session;Repositioned    Home Living                      Prior Function  PT Goals (current goals can now be found in the care plan section) Acute Rehab PT Goals Patient Stated Goal: get stronger PT Goal Formulation: With patient Time For Goal Achievement: 08/26/20 Potential to Achieve Goals: Fair Progress towards PT goals: Progressing toward goals    Frequency    Min 2X/week      PT Plan Current plan remains appropriate    Co-evaluation              AM-PAC PT "6 Clicks" Mobility   Outcome Measure  Help needed turning from your back to your side while in a flat bed  without using bedrails?: A Little Help needed moving from lying on your back to sitting on the side of a flat bed without using bedrails?: A Little Help needed moving to and from a bed to a chair (including a wheelchair)?: A Lot Help needed standing up from a chair using your arms (e.g., wheelchair or bedside chair)?: A Lot Help needed to walk in hospital room?: A Lot Help needed climbing 3-5 steps with a railing? : Total 6 Click Score: 13    End of Session Equipment Utilized During Treatment: Gait belt Activity Tolerance: Patient tolerated treatment well;Treatment limited secondary to medical complications (Comment) (elevated HR ) Patient left: in chair;with call bell/phone within reach   PT Visit Diagnosis: Muscle weakness (generalized) (M62.81);Other abnormalities of gait and mobility (R26.89)     Time: 6979-4801 PT Time Calculation (min) (ACUTE ONLY): 30 min  Charges:  $Gait Training: 8-22 mins $Therapeutic Activity: 8-22 mins                     Baxter Flattery, PT  Acute Rehab Dept ([place]) [phone] Pager [phone]  08/30/2020    [facility] 08/30/2020, 11:53 AM

## 2020-08-31 ENCOUNTER — Inpatient Hospital Stay (HOSPITAL_COMMUNITY): Payer: Medicare HMO

## 2020-08-31 DIAGNOSIS — I82622 Acute embolism and thrombosis of deep veins of left upper extremity: Secondary | ICD-10-CM | POA: Diagnosis not present

## 2020-08-31 DIAGNOSIS — K567 Ileus, unspecified: Secondary | ICD-10-CM | POA: Diagnosis not present

## 2020-08-31 DIAGNOSIS — E872 Acidosis: Secondary | ICD-10-CM | POA: Diagnosis not present

## 2020-08-31 DIAGNOSIS — A4151 Sepsis due to Escherichia coli [E. coli]: Secondary | ICD-10-CM

## 2020-08-31 DIAGNOSIS — N182 Chronic kidney disease, stage 2 (mild): Secondary | ICD-10-CM | POA: Diagnosis not present

## 2020-08-31 LAB — CULTURE, BLOOD (ROUTINE X 2)
Culture: NO GROWTH
Special Requests: ADEQUATE

## 2020-08-31 LAB — COMPREHENSIVE METABOLIC PANEL
ALT: 56 U/L — ABNORMAL HIGH (ref 0–44)
AST: 40 U/L (ref 15–41)
Albumin: 2.3 g/dL — ABNORMAL LOW (ref 3.5–5.0)
Alkaline Phosphatase: 119 U/L (ref 38–126)
Anion gap: 10 (ref 5–15)
BUN: 20 mg/dL (ref 6–20)
CO2: 21 mmol/L — ABNORMAL LOW (ref 22–32)
Calcium: 8.6 mg/dL — ABNORMAL LOW (ref 8.9–10.3)
Chloride: 104 mmol/L (ref 98–111)
Creatinine, Ser: 0.66 mg/dL (ref 0.61–1.24)
GFR, Estimated: >60 mL/min (ref >60)
Glucose, Bld: 398 mg/dL — ABNORMAL HIGH (ref 70–99)
Potassium: 3.5 mmol/L (ref 3.5–5.1)
Sodium: 135 mmol/L (ref 135–145)
Total Bilirubin: 0.5 mg/dL (ref 0.3–1.2)
Total Protein: 6.3 g/dL — ABNORMAL LOW (ref 6.5–8.1)

## 2020-08-31 LAB — GLUCOSE, CAPILLARY
Glucose-Capillary: 299 mg/dL — ABNORMAL HIGH (ref 70–99)
Glucose-Capillary: 328 mg/dL — ABNORMAL HIGH (ref 70–99)
Glucose-Capillary: 332 mg/dL — ABNORMAL HIGH (ref 70–99)
Glucose-Capillary: 360 mg/dL — ABNORMAL HIGH (ref 70–99)
Glucose-Capillary: 369 mg/dL — ABNORMAL HIGH (ref 70–99)
Glucose-Capillary: 393 mg/dL — ABNORMAL HIGH (ref 70–99)

## 2020-08-31 LAB — CBC
HCT: 24.4 % — ABNORMAL LOW (ref 39.0–52.0)
Hemoglobin: 7.5 g/dL — ABNORMAL LOW (ref 13.0–17.0)
MCH: 24.4 pg — ABNORMAL LOW (ref 26.0–34.0)
MCHC: 30.7 g/dL (ref 30.0–36.0)
MCV: 79.5 fL — ABNORMAL LOW (ref 80.0–100.0)
Platelets: 143 10*3/uL — ABNORMAL LOW (ref 150–400)
RBC: 3.07 MIL/uL — ABNORMAL LOW (ref 4.22–5.81)
RDW: 22.5 % — ABNORMAL HIGH (ref 11.5–15.5)
WBC: 11.1 10*3/uL — ABNORMAL HIGH (ref 4.0–10.5)
nRBC: 0 % (ref 0.0–0.2)

## 2020-08-31 LAB — URINE CULTURE: Culture: >=100000 — AB

## 2020-08-31 LAB — MAGNESIUM: Magnesium: 2.1 mg/dL (ref 1.7–2.4)

## 2020-08-31 LAB — HEPARIN LEVEL (UNFRACTIONATED): Heparin Unfractionated: 0.47 IU/mL (ref 0.30–0.70)

## 2020-08-31 LAB — PHOSPHORUS: Phosphorus: 3.2 mg/dL (ref 2.5–4.6)

## 2020-08-31 MED ORDER — POTASSIUM CHLORIDE 10 MEQ/100ML IV SOLN
10.0000 meq | INTRAVENOUS | Status: AC
  Administered 2020-08-31 (×3): 10 meq via INTRAVENOUS
  Filled 2020-08-31: qty 100

## 2020-08-31 MED ORDER — POTASSIUM CHLORIDE 10 MEQ/100ML IV SOLN
INTRAVENOUS | Status: AC
  Administered 2020-08-31: 10 meq via INTRAVENOUS
  Filled 2020-08-31: qty 100

## 2020-08-31 MED ORDER — TRAVASOL 10 % IV SOLN
INTRAVENOUS | Status: AC
  Filled 2020-08-31: qty 600

## 2020-08-31 MED ORDER — POTASSIUM CHLORIDE 10 MEQ/100ML IV SOLN
INTRAVENOUS | Status: AC
  Filled 2020-08-31: qty 100

## 2020-08-31 NOTE — Progress Notes (Signed)
Subjective:  No complaints.  Passing flatus and bowel movements.  Objective: Vital signs in last 24 hours: Temp:  [97.4 F (36.3 C)-102.1 F (38.9 C)] 97.6 F (36.4 C) (10/14 1242) Pulse Rate:  [75-117] 75 (10/14 1242) Resp:  [16-25] 16 (10/14 1242) BP: (139-165)/(83-101) 165/101 (10/14 1242) SpO2:  [98 %-99 %] 99 % (10/14 1242) Last BM Date: 08/31/20  Intake/Output from previous day: 10/13 0701 - 10/14 0700 In: 4643.9 [P.O.:960; I.V.:2634; IV Piggyback:1050] Out: 1475 [Urine:1475] Intake/Output this shift: Total I/O In: -  Out: 625 [Urine:625]  General appearance: alert and no distress Resp: clear to auscultation bilaterally Cardio: regular rate and rhythm GI: distended, soft, +BS Extremities: 3+ edema  Lab Results: Recent Labs    08/29/20 0425 08/30/20 0439 08/31/20 0439  WBC 9.0 9.7 11.1*  HGB 7.6* 7.5* 7.5*  HCT 24.6* 23.8* 24.4*  PLT 148* 146* 143*   BMET Recent Labs    08/29/20 0425 08/30/20 0439 08/31/20 0439  NA 132* 134* 135  K 4.6 3.5 3.5  CL 104 104 104  CO2 21* 22 21*  GLUCOSE 294* 193* 398*  BUN 15 15 20   CREATININE 0.69 0.64 0.66  CALCIUM 8.3* 8.2* 8.6*   LFT Recent Labs    08/31/20 0439  PROT 6.3*  ALBUMIN 2.3*  AST 40  ALT 56*  ALKPHOS 119  BILITOT 0.5   PT/INR No results for input(s): LABPROT, INR in the last 72 hours. Hepatitis Panel No results for input(s): HEPBSAG, HCVAB, HEPAIGM, HEPBIGM in the last 72 hours. C-Diff No results for input(s): CDIFFTOX in the last 72 hours. Fecal Lactopherrin No results for input(s): FECLLACTOFRN in the last 72 hours.  Studies/Results: DG Chest 2 View  Result Date: 08/31/2020 CLINICAL DATA:  Fever EXAM: CHEST - 2 VIEW COMPARISON:  August 29, 2020 FINDINGS: Lungs are clear. Heart is borderline enlarged with pulmonary vascularity normal. No adenopathy. Central catheter tip is in the superior vena cava. No pneumothorax. There is degenerative change in the thoracic spine. IMPRESSION:  Borderline cardiac enlargement. Lungs clear. Central catheter tip in superior vena cava. Electronically Signed   By: Lowella Grip III M.D.   On: 08/31/2020 10:19    Medications:  Scheduled: . (feeding supplement) PROSource Plus  30 mL Oral BID BM  . Chlorhexidine Gluconate Cloth  6 each Topical Daily  . feeding supplement (K[employer] STANDARD 1.4)  325 mL Oral Daily  . insulin aspart  0-20 Units Subcutaneous Q4H  . insulin glargine  7 Units Subcutaneous QHS  . methylPREDNISolone (SOLU-MEDROL) injection  40 mg Intravenous Daily  . metoCLOPramide (REGLAN) injection  5 mg Intravenous Q8H  . metoprolol tartrate  7.5 mg Intravenous Q6H  . polyethylene glycol  17 g Oral Daily  . sodium chloride flush  10-40 mL Intracatheter Q12H   Continuous: . sodium chloride 10 mL/hr at 08/30/20 0200  . azithromycin 500 mg (08/31/20 1047)  . ceFEPime (MAXIPIME) IV 2 g (08/31/20 1156)  . heparin 1,650 Units/hr (08/31/20 1107)  . TPN ADULT (ION) 75 mL/hr at 08/30/20 1738  . TPN ADULT (ION)      Assessment/Plan: 1) Ileus. 2) Anemia.   Palpation of the abdomen is definitely softer today.  Again, he is passing flatus and bowel movements.  He is currently tolerating a soft diet.  It is possible to advance him to a regular diet tomorrow barring any set backs.  Plan: 1) Continue with the soft diet. 2) Continue with mobility. 3) Control blood sugar.  LOS: 19 days  Brenly Trawick D 08/31/2020, 3:00 PM

## 2020-08-31 NOTE — Progress Notes (Signed)
PROGRESS NOTE    [name]  KGY:[id number] DOB: May 07, 1961 DOA: 08/10/2020 PCP: Charlott Rakes, MD    Chief Complaint  Patient presents with  . Leg Swelling  . Medical Clearance    rehab placement     Brief Narrative:  [name] is a 59 yo male with past medical history significant of type 2 diabetes, chronic gout who was recently hospitalized 8/11-8/24 with AKI, at that time found to have left upper extremity DVT in addition to polyarticular gout.  He now comes in with sepsis, UTI, bowel obstruction. He has been on IV heparin due to LUE DVT as well as TPN due to ileus.     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  Tolerating full liquid diet at this time.  Patient stated had a large bowel movement on 08/15/2020, passing flatus.  Patient stated had a large bowel movement (08/18/2020) however per RN patient with mucus.  Patient also with some nausea and emesis on 08/18/2020. Per RN patient has not had any significant bowel movement since 08/15/2020. Patient stated ambulated in hallways on 08/15/2020 with therapy. Abdominal films done with stable diffuse colonic dilatation is noted most consistent with colonic ileus/adynamic ileus with progression and colonic  pneumatosis..  CT abdomen and pelvis done 08/14/2020 with findings consistent with colonic ileus, pneumatosis of the ascending and hepatic flexure of the colon.  No associated colonic wall thickening or portal venous gas.  Normal small bowel without obstruction.  Hepatic steatosis.  Bilateral renal parenchymal atrophy with lobular contours.  Mild bladder wall thickening.  Patient with no significant change with abdominal distention.  Abdominal films with persistent gaseous distention of large and small bowel loops.  Patient seen by GI and NG tube ordered however unable to be placed per RN.  NG tube placed by GI under guidewire.  Per RN after NG tube placed patient with some relief of abdominal discomfort.  General surgery was following and initially recommended mobilization and GI decompression.  General surgery ordered neostigmine x 1 and per RN patient noted to have watery loose stools after neostigmine was given.  Rectal tube has been ordered per GI.  Repeat abdominal films with unchanged gaseous distention of small bowel and colon compatible with ileus.    NG tube has subsequently been discontinued.  PICC line placed and patient was started on TPN.    General surgery was following but signed off 08/22/2020.  Patient with TPN and once tolerating oral intake will start to wean down TPN.  Patient was placed on a full liquid diet per Dr. Benson Norway who had recommended advancement to a soft diet however patient noted to have some abdominal distention and nausea when placed on a soft diet and was placed back on a full liquid diet.  Continue supportive care.  We  will advance patient to a soft diet today to see if patient is able to tolerate. Keep potassium > 4, magnesium > 2.  Supportive care.  GI following.  2.  Polyarticular gout with osteoarthritis/acute gouty arthritis It is noted that patient had previous work-up with rheumatoid factor and ANA which were negative.  Steroids on hold.  Allopurinol discontinued on  admission.  Colchicine on hold.  Per RN patient with complaints of exquisite right hand pain concerning for an acute gouty flare on 08/30/2020.  Patient given a dose of IV Solu-Medrol and placed on IV Solu-Medrol 40 mg daily x3 more days.  Outpatient follow-up with rheumatology.  3.  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  Patient noted to have been on 70/30 insulin 50 units twice daily.  CBG at 332 this morning.  Increase blood glucose levels likely due to initiation of steroids due to concern for acute gouty flare.  Patient on TPN and insulin being adjusted per pharmacy.  Increase Lantus to 10 units daily.  Follow.  4.  Hypokalemia/hypomagnesemia Potassium at 3.5, magnesium at 2.1, phosphorus at 3.2.  Patient on TPN and electrolytes being repleted per pharmacy.  5.  Left upper extremity DVT Was on apixaban twice daily.  Patient currently n.p.o. due to colonic ileus.  IV heparin for anticoagulation.  Once on a diet consistently will resume apixaban.  Follow.   6.  Iron deficiency anemia Status post IV Feraheme 08/12/2020.    Hemoglobin stable at 7.5.  Likely dilutional effect.  Once consistently tolerating a diet will need to resume oral iron supplementation.  Transfusion threshold hemoglobin <7.    7.  Sepsis secondary to E. coli UTI, Pseudomonas aeruginosa, POA Patient noted to have urine cultures are growing 100,000 colonies of E. coli was initially on Keflex and subsequently transitioned to IV Ancef as patient noted to be n.p.o.  Patient finished a full course of antibiotics on 08/19/2020.  Patient however was having fevers on 08/26/2020 and repeat urinalysis concerning for UTI.  Patient started on IV Rocephin 08/26/2020, blood cultures done 08/26/2020 with no growth to date.  Urine cultures were positive for E. coli as well as Pseudomonas aeruginosa.  Patient noted to have a fever again on 08/29/2020 with tachycardia and due to concerns for possible  pneumonia azithromycin added to regimen of IV Rocephin.  Cultures did show Pseudomonas and as such IV Rocephin has been discontinued and patient started on IV cefepime 08/30/2020.  Follow.  8.  Fevers Patient noted to have ongoing fevers for the past few days.  Patient initially with fever on 08/26/2020 with repeat urinalysis concerning for UTI.  Patient placed back on IV Rocephin 08/26/2020 and pancultured.  Urine cultures positive for E. coli as well as Pseudomonas and as such antibiotics were broadened to IV cefepime as patient noted to have fevers again on 08/29/2020.  Patient with fevers overnight 08/30/2020.  Check a 2 view chest x-ray.  Repeat blood cultures x2.  Continue empiric IV cefepime.  If continued fevers may need CT abdomen and pelvis and may need to consider adding antifungal agent as patient on TPN.  9.  Prior bilateral knee surgeries PT/OT.  SNF placement.  10.  Chronic kidney disease stage II Stable.     11.  Metabolic acidosis Patient noted to be acidotic.  Concern for abdominal ischemia as patient with significant colonic ileus with no significant improvement.  Lactic acid level within normal limits.  Acidosis resolved with bicarb drip.  Bicarb drip has  been discontinued.  Follow.  12.  Nutrition Due to patient's colonic distention/ileus patient has been n.p.o. for several days.    Status post NG tube and rectal tube placement.  PICC line placed and patient has been started on TPN per pharmacy.  Patient tolerating full liquid diet will advance to a soft diet to see how patient able to tolerate.  Follow.   13.  Severe onchogryphosis  14.  QTC prolongation Per RN patient with QTC prolongation of 550.  Repeat EKG 08/22/2020 with resolution of QTC prolongation.  Keep potassium > 4, magnesium > 2.     DVT prophylaxis: Heparin Code Status: Full Family Communication: Updated patient. Updated sister in law, Warren on telephone. Disposition:   Status is:  Inpatient    Dispo:  Patient From:    Planned Disposition: [facility]  Expected discharge date: 09/04/20  Medically stable for discharge:  No        Consultants:   Gastroenterology: Dr. Collene Mares 08/14/2020  General surgery: Dr. Hassell Done 08/14/2020  Procedures:   CT angiogram chest 08/10/2020  CT abdomen and pelvis 08/14/2020  2D echo 08/11/2020  Abdominal films 08/18/2020, 08/17/2020, 08/16/2020, 08/14/2020  Antimicrobials:  Anti-infectives (From admission, onward)   Start     Dose/Rate Route Frequency Ordered Stop   08/30/20 1100  ceFEPIme (MAXIPIME) 2 g in sodium chloride 0.9 % 100 mL IVPB        2 g 200 mL/hr over 30 Minutes Intravenous Every 12 hours 08/30/20 1003     08/29/20 1030  azithromycin (ZITHROMAX) 500 mg in sodium chloride 0.9 % 250 mL IVPB        500 mg 250 mL/hr over 60 Minutes Intravenous Every 24 hours 08/29/20 0940     08/26/20 2300  cefTRIAXone (ROCEPHIN) 1 g in sodium chloride 0.9 % 100 mL IVPB  Status:  Discontinued        1 g 200 mL/hr over 30 Minutes Intravenous Every 24 hours 08/26/20 2209 08/30/20 0956   08/15/20 1600  ceFAZolin (ANCEF) IVPB 1 g/50 mL premix        1 g 100 mL/hr over 30 Minutes Intravenous Every 8 hours 08/15/20 1429 08/19/20 2243   08/14/20 1730  cefTRIAXone (ROCEPHIN) 1 g in sodium chloride 0.9 % 100 mL IVPB  Status:  Discontinued        1 g 200 mL/hr over 30 Minutes Intravenous Every 24 hours 08/14/20 1647 08/15/20 1429   08/12/20 1230  cephALEXin (KEFLEX) capsule 500 mg  Status:  Discontinued        500 mg Oral Every 12 hours 08/12/20 1139 08/14/20 1647       Subjective: Patient sitting in chair.  Denies any chest pain or shortness of breath.  No significant abdominal pain.  Complaining of right hand pain which has been ongoing for the past 24 hours similar to prior gout flares.  Patient tolerating full liquid diet.  Patient willing to try to advance to a soft diet today.  Patient noted still to be spiking fevers  with a temp as high as 102.1 yesterday evening.   Objective: Vitals:   08/30/20 2153 08/31/20 0000 08/31/20 0050 08/31/20 0400  BP:  139/83  (!) 154/86  Pulse:  96  86  Resp:  (!) 21  18  Temp: 99 F (37.2 C)  98.2 F (36.8 C) (!) 97.4 F (36.3 C)  TempSrc: Oral  Oral Oral  SpO2:  99%  98%  Weight:      Height:  Intake/Output Summary (Last 24 hours) at 08/31/2020 0958 Last data filed at 08/31/2020 0600 Gross per 24 hour  Intake 4643.93 ml  Output 1075 ml  Net 3568.93 ml   Filed Weights   08/21/20 0500 08/22/20 0509 08/28/20 1820  Weight: 108.4 kg 105.3 kg 103.3 kg    Examination:  General exam: NAD Respiratory system: CTAB.  No wheezes, no crackles, no rhonchi.  Normal respiratory effort.  Speaking in full sentences.  Cardiovascular system: Tachycardia. No JVD, murmurs, rubs, gallops or clicks.  Trace bilateral lower extremity edema.   Gastrointestinal system: Abdomen distended, soft, nontender to palpation, positive bowel sounds.  No rebound.  No guarding.  Central nervous system: Alert and oriented. No focal neurological deficits. Extremities: Right hand swelling and exquisite TTP.  Symmetric 5 x 5 power. Skin: No rashes, lesions or ulcers Psychiatry: Judgement and insight appear normal. Mood & affect appropriate.     Data Reviewed: I have personally reviewed following labs and imaging studies  CBC: Recent Labs  Lab 08/27/20 0311 08/28/20 0400 08/29/20 0425 08/30/20 0439 08/31/20 0439  WBC 8.3 7.7 9.0 9.7 11.1*  NEUTROABS  --  4.7  --   --   --   HGB 7.9* 7.7* 7.6* 7.5* 7.5*  HCT 25.7* 25.0* 24.6* 23.8* 24.4*  MCV 80.1 79.6* 78.6* 78.5* 79.5*  PLT 165 154 148* 146* 143*    Basic Metabolic Panel: Recent Labs  Lab 08/26/20 0415 08/26/20 0415 08/27/20 0311 08/28/20 0400 08/29/20 0425 08/30/20 0439 08/31/20 0439  NA 135   < > 136 134* 132* 134* 135  K 4.1   < > 3.9 4.3 4.6 3.5 3.5  CL 104   < > 103 104 104 104 104  CO2 23   < > 23 22 21*  22 21*  GLUCOSE 170*   < > 192* 197* 294* 193* 398*  BUN 14   < > 17 15 15 15 20   CREATININE 0.69   < > 0.76 0.70 0.69 0.64 0.66  CALCIUM 8.4*   < > 8.5* 8.6* 8.3* 8.2* 8.6*  MG 1.9   < > 2.1 1.8 1.8 2.1 2.1  PHOS 3.8  --   --  3.5 3.6 3.9 3.2   < > = values in this interval not displayed.    GFR: Estimated Creatinine Clearance: 108.6 mL/min (by C-G formula based on SCr of 0.66 mg/dL).  Liver Function Tests: Recent Labs  Lab 08/26/20 0415 08/28/20 0400 08/29/20 0425 08/30/20 0439 08/31/20 0439  AST 24 30 33 36 40  ALT 23 33 41 47* 56*  ALKPHOS 71 84 95 110 119  BILITOT 0.4 0.7 0.4 0.6 0.5  PROT 5.6* 5.9* 6.0* 5.9* 6.3*  ALBUMIN 2.6* 2.5* 2.5* 2.4* 2.3*    CBG: Recent Labs  Lab 08/30/20 1531 08/30/20 2016 08/31/20 0014 08/31/20 0433 08/31/20 0825  GLUCAP 187* 239* 299* 393* 332*     Recent Results (from the past 240 hour(s))  Culture, blood (routine x 2)     Status: None (Preliminary result)   Collection Time: 08/26/20  2:27 PM   Specimen: BLOOD  Result Value Ref Range Status   Specimen Description   Final    BLOOD LEFT FOOT Performed at [facility], [place] [address] [place], [place] [address]    Special Requests   Final    BOTTLES DRAWN AEROBIC AND ANAEROBIC Blood Culture adequate volume Performed at [facility] [address] [place], [place] [address]    Culture  Final    NO GROWTH 4 DAYS Performed at [facility], [place] [address] [place], [place] [address]    Report Status PENDING  Incomplete  Culture, Urine     Status: Abnormal   Collection Time: 08/26/20  5:44 PM   Specimen: Urine, Clean Catch  Result Value Ref Range Status   Specimen Description URINE, CLEAN CATCH  Final   Special Requests NONE  Final   Culture (A)  Final    >=100,000 COLONIES/mL ESCHERICHIA COLI >=100,000 COLONIES/mL PSEUDOMONAS AERUGINOSA    Report Status 08/31/2020 FINAL  Final   Organism ID, Bacteria ESCHERICHIA COLI (A)   Final   Organism ID, Bacteria PSEUDOMONAS AERUGINOSA (A)  Final      Susceptibility   Escherichia coli - MIC*    AMPICILLIN 4 SENSITIVE Sensitive     CEFAZOLIN <=4 SENSITIVE Sensitive     CEFTRIAXONE <=0.25 SENSITIVE Sensitive     CIPROFLOXACIN <=0.25 SENSITIVE Sensitive     GENTAMICIN <=1 SENSITIVE Sensitive     IMIPENEM <=0.25 SENSITIVE Sensitive     NITROFURANTOIN <=16 SENSITIVE Sensitive     TRIMETH/SULFA <=20 SENSITIVE Sensitive     AMPICILLIN/SULBACTAM <=2 SENSITIVE Sensitive     PIP/TAZO <=4 SENSITIVE Sensitive     * >=100,000 COLONIES/mL ESCHERICHIA COLI   Pseudomonas aeruginosa - MIC*    CEFTAZIDIME 4 SENSITIVE Sensitive     CIPROFLOXACIN <=0.25 SENSITIVE Sensitive     GENTAMICIN <=1 SENSITIVE Sensitive     IMIPENEM 1 SENSITIVE Sensitive     PIP/TAZO <=4 SENSITIVE Sensitive     CEFEPIME 2 SENSITIVE Sensitive     * >=100,000 COLONIES/mL PSEUDOMONAS AERUGINOSA         Radiology Studies: DG Abd 1 View  Result Date: 08/29/2020 CLINICAL DATA:  Fever.  Follow-up stent bowel. EXAM: ABDOMEN - 1 VIEW COMPARISON:  Abdominal radiographs 08/26/2020, 08/22/2020, and 08/21/2020. FINDINGS: Persistent dilated loops of small bowel are present. There is some gas in the colon. No definite free air is present. Decubitus views would be more helpful for assessment of fluid levels or free air. Bowel distention is increasing. IMPRESSION: 1. Persistent dilated loops of small bowel, compatible with small bowel obstruction. 2. No definite free air. Electronically Signed   By: San Morelle M.D.   On: 08/29/2020 10:40   DG CHEST PORT 1 VIEW  Result Date: 08/29/2020 CLINICAL DATA:  59 year old male with history of fever. Distended bowel. EXAM: PORTABLE CHEST 1 VIEW COMPARISON:  Chest x-ray 08/26/2020. FINDINGS: There is a right upper extremity PICC with tip terminating in the distal superior vena cava. Lung volumes are low. No consolidative airspace disease. No pleural effusions. No  pneumothorax. No pulmonary nodule or mass noted. Pulmonary vasculature and the cardiomediastinal silhouette are within normal limits. Visualized portions of the upper abdomen demonstrate gaseous distension in the visualized bowel. IMPRESSION: 1. Low lung volumes without radiographic evidence of acute cardiopulmonary disease. 2. Gaseous distension of the bowel noted beneath the diaphragm. Electronically Signed   By: Vinnie Langton M.D.   On: 08/29/2020 10:44        Scheduled Meds: . (feeding supplement) PROSource Plus  30 mL Oral BID BM  . acetaminophen  1,000 mg Oral Q8H  . Chlorhexidine Gluconate Cloth  6 each Topical Daily  . feeding supplement ([employer] STANDARD 1.4)  325 mL Oral Daily  . insulin aspart  0-20 Units Subcutaneous Q4H  . insulin glargine  7 Units Subcutaneous QHS  . methylPREDNISolone (SOLU-MEDROL) injection  40 mg Intravenous  Daily  . metoCLOPramide (REGLAN) injection  5 mg Intravenous Q8H  . metoprolol tartrate  7.5 mg Intravenous Q6H  . polyethylene glycol  17 g Oral Daily  . sodium chloride flush  10-40 mL Intracatheter Q12H   Continuous Infusions: . sodium chloride 10 mL/hr at 08/30/20 0200  . azithromycin 500 mg (08/30/20 1053)  . ceFEPime (MAXIPIME) IV 2 g (08/30/20 2210)  . heparin 1,650 Units/hr (08/30/20 2010)  . potassium chloride    . TPN ADULT (ION) 75 mL/hr at 08/30/20 1738  . TPN ADULT (ION)       LOS: 19 days    Time spent: 40 minutes    Irine Seal, MD [facility]   To contact the attending provider between 7A-7P or the covering provider during after hours 7P-7A, please log into the web site [email or web address] and access using universal [place] password for that web site. If you do not have the password, please call the hospital operator.  08/31/2020, 9:58 AM

## 2020-08-31 NOTE — Progress Notes (Signed)
PHARMACY - TOTAL PARENTERAL NUTRITION CONSULT NOTE   Indication: Prolonged ileus  Patient Measurements: Height: 5\' 5"  (165.1 cm) Weight: 103.3 kg (227 lb 11.8 oz) IBW/kg (Calculated) : 61.5 TPN AdjBW (KG): 72.7 Body mass index is 37.9 kg/m.  Assessment:  59 yo male with PMH gout, OA, DM2, anemia admitted with upper extremity DVT and development of colonic ileus. GI following, recommend NGT to LIWS, s/p neostigmine 10/1, no surgical indications at this point unless he perforates or has ischemia, have signed off 10/4.   Glucose / Insulin: Hx DM, A1c 7.18 Jun 2020 - CBGs elevated (412-878):  with 23 units insulin in TPN and 7 units Lantus daily; most recent = 332 with new TPN bag, note patient on Solumedrol 40mg  IV daily - 57 units of resistant SSI given yesterday  Electrolytes: Na still slightly low; K below goal at 3.5 - Md ordered 4 runs KCL this AM; Mg and phos WNL - goal K >/= 4.0, Mg >/= 2.0 with ileus Renal: Hx CKD3; SCr, BUN stable WNL; UOP remains adequate Hepatic: LFTs WNL; tbili stable WNL; albumin remains low but stable Prealbumin: low but slowly improving TG: stable WNL I/O: NGT removed 10/8 - MIVF: NS at [facility].  Remains on IV reglan q8h GI Imaging: - 9/27 CT abd/pelvis: findings consistent with colonic ileus, pneumatosis of the ascending and hepatic flexure of the colon. - 10/3 AXR:  stable diffuse colonic dilatation is noted most consistent with colonic ileus/adynamic ileus with progression and colonic pneumatosis. - 10/9 AXR: Unchanged gaseous dilation of loops of large and small bowel. - 10/12 AXR: Persistent dilated loops of small bowel, compatible with small bowel obstruction. Surgeries / Procedures:   Central access: PICC TPN start date: 10/5  Nutritional Goals (per RD recommendation on 10/6): Kcal:  1850-2050 Protein:  95-110g Fluid:  2L/day  Goal TPN rate is 90 mL/hr (provides 108 g of protein and 2181 kcals per day)  Current Nutrition:  Diet advanced to  FLD 10/12 Per charting taking about 50-100% of meals with FLD - [place] and prosource ordered per dietitian  Plan:   KCL 10meq IV x 4 runs per Md  Today at 1800:  Continue TPN but will reduce rate from 75 ml/hr to 50 ml/hr (~56% support with diet advancement/supplements)  Electrolytes in TPN: increase Na  125 mEq/L of Na  50 mEq/L of K  5 mEq/L of Ca  12 mEq/L of Mg  15 mmol/L of Phos   Cl:Ac ratio 1:2  Standard MVI and trace elements to TPN  SSI to resistant scale with increased frequency q4h CBG checks   Increase insulin in TPN some from 23 to 28 units but decrease in TPN rate should help normalize CBGs as well; continue Lantus 7 units qhs  Continue MIVF at [facility] labs on Mon/Thurs   Adrian Saran, PharmD, BCPS [phone] until 3pm 08/31/2020 8:51 AM

## 2020-08-31 NOTE — Progress Notes (Signed)
ANTICOAGULATION CONSULT NOTE - Follow Up Consult  Pharmacy Consult for IV heparin (on apixaban PTA) Indication: history of DVT  Allergies  Allergen Reactions  . Hctz [Hydrochlorothiazide]     Hx of gout; uric acid 14.1 on 03/13/20 Acute gout L elbow 8/11-8/24/21    Patient Measurements: Height: 5\' 5"  (165.1 cm) Weight: 103.3 kg (227 lb 11.8 oz) IBW/kg (Calculated) : 61.5 Heparin Dosing Weight: 85.7 kg  Vital Signs: Temp: 97.4 F (36.3 C) (10/14 0400) Temp Source: Oral (10/14 0400) BP: 154/86 (10/14 0400) Pulse Rate: 86 (10/14 0400)  Labs: Recent Labs    08/29/20 0425 08/29/20 0425 08/30/20 0439 08/30/20 0439 08/30/20 1412 08/30/20 2147 08/31/20 0439  HGB 7.6*   < > 7.5*  --   --   --  7.5*  HCT 24.6*  --  23.8*  --   --   --  24.4*  PLT 148*  --  146*  --   --   --  143*  HEPARINUNFRC 0.39   < > 0.23*   < > 0.26* 0.37 0.47  CREATININE 0.69  --  0.64  --   --   --  0.66   < > = values in this interval not displayed.    Estimated Creatinine Clearance: 108.6 mL/min (by C-G formula based on SCr of 0.66 mg/dL).  Assessment: 3 y/oM with PMH of DVT (07/03/2020) on Apixaban PTA admitted on 08/10/2020 with SIRS. PO meds held d/t colonic ileus. Pharmacy consulted for IV heparin dosing while Apixaban on hold.   08/31/20 8:41 AM   HL = 0.47 (therapeutic) on current IV heparin at 1650 units/hr   CBC: Hgb low but stable Pltc WNL  No bleeding or infusion issues noted  Goal of Therapy:  Heparin level 0.3-0.7 units/ml Monitor platelets by anticoagulation protocol: Yes   Plan:   Continue heparin infusion @ 1650 units/hr  Daily heparin level and CBC  Monitor closely for signs and symptoms of bleeding  F/u ability to resume [place], PharmD, BCPS [phone] until 3pm 08/31/2020 8:42 AM

## 2020-08-31 NOTE — TOC Progression Note (Signed)
Transition of Care [facility]) - Progression Note    Patient Details  Name: [name] MRN: [id number] Date of Birth: October 12, 1961  [facility] CM/SW Contact  Thailan Sava, Juliann Pulse, RN Phone Number: 08/31/2020, 10:48 AM  Clinical Narrative: d/c plan SNF-[facility] await medical stability-ileus,hx:dvt,uti-TPN weaning,fulls,hep gtt,iv abx,iv solumedrol.      Expected Discharge Plan: [place] Barriers to Discharge: Continued Medical Work up  Expected Discharge Plan and Services Expected Discharge Plan: [place]   Discharge Planning Services: CM Consult Post Acute Care Choice: [place] Living arrangements for the past 2 months: Single Family Home                                       Social Determinants of Health (SDOH) Interventions    Readmission Risk Interventions No flowsheet data found.

## 2020-09-01 ENCOUNTER — Inpatient Hospital Stay (HOSPITAL_COMMUNITY): Payer: Medicare HMO

## 2020-09-01 DIAGNOSIS — I82622 Acute embolism and thrombosis of deep veins of left upper extremity: Secondary | ICD-10-CM | POA: Diagnosis not present

## 2020-09-01 DIAGNOSIS — K567 Ileus, unspecified: Secondary | ICD-10-CM | POA: Diagnosis not present

## 2020-09-01 DIAGNOSIS — E872 Acidosis: Secondary | ICD-10-CM | POA: Diagnosis not present

## 2020-09-01 DIAGNOSIS — N182 Chronic kidney disease, stage 2 (mild): Secondary | ICD-10-CM | POA: Diagnosis not present

## 2020-09-01 LAB — GLUCOSE, CAPILLARY
Glucose-Capillary: 198 mg/dL — ABNORMAL HIGH (ref 70–99)
Glucose-Capillary: 220 mg/dL — ABNORMAL HIGH (ref 70–99)
Glucose-Capillary: 223 mg/dL — ABNORMAL HIGH (ref 70–99)
Glucose-Capillary: 225 mg/dL — ABNORMAL HIGH (ref 70–99)
Glucose-Capillary: 251 mg/dL — ABNORMAL HIGH (ref 70–99)
Glucose-Capillary: 334 mg/dL — ABNORMAL HIGH (ref 70–99)

## 2020-09-01 LAB — CBC
HCT: 23.8 % — ABNORMAL LOW (ref 39.0–52.0)
Hemoglobin: 7.4 g/dL — ABNORMAL LOW (ref 13.0–17.0)
MCH: 24.3 pg — ABNORMAL LOW (ref 26.0–34.0)
MCHC: 31.1 g/dL (ref 30.0–36.0)
MCV: 78.3 fL — ABNORMAL LOW (ref 80.0–100.0)
Platelets: 174 10*3/uL (ref 150–400)
RBC: 3.04 MIL/uL — ABNORMAL LOW (ref 4.22–5.81)
RDW: 22.2 % — ABNORMAL HIGH (ref 11.5–15.5)
WBC: 15.9 10*3/uL — ABNORMAL HIGH (ref 4.0–10.5)
nRBC: 0 % (ref 0.0–0.2)

## 2020-09-01 LAB — BASIC METABOLIC PANEL
Anion gap: 8 (ref 5–15)
BUN: 22 mg/dL — ABNORMAL HIGH (ref 6–20)
CO2: 23 mmol/L (ref 22–32)
Calcium: 8.6 mg/dL — ABNORMAL LOW (ref 8.9–10.3)
Chloride: 109 mmol/L (ref 98–111)
Creatinine, Ser: 0.65 mg/dL (ref 0.61–1.24)
GFR, Estimated: >60 mL/min (ref >60)
Glucose, Bld: 210 mg/dL — ABNORMAL HIGH (ref 70–99)
Potassium: 3.2 mmol/L — ABNORMAL LOW (ref 3.5–5.1)
Sodium: 140 mmol/L (ref 135–145)

## 2020-09-01 LAB — MAGNESIUM: Magnesium: 2.2 mg/dL (ref 1.7–2.4)

## 2020-09-01 LAB — HEPARIN LEVEL (UNFRACTIONATED): Heparin Unfractionated: 0.63 IU/mL (ref 0.30–0.70)

## 2020-09-01 MED ORDER — PANTOPRAZOLE SODIUM 40 MG IV SOLR
40.0000 mg | INTRAVENOUS | Status: DC
  Administered 2020-09-01 – 2020-09-03 (×3): 40 mg via INTRAVENOUS
  Filled 2020-09-01 (×3): qty 40

## 2020-09-01 MED ORDER — POTASSIUM CHLORIDE CRYS ER 20 MEQ PO TBCR
40.0000 meq | EXTENDED_RELEASE_TABLET | ORAL | Status: AC
  Administered 2020-09-01 (×2): 40 meq via ORAL
  Filled 2020-09-01 (×2): qty 2

## 2020-09-01 MED ORDER — METOPROLOL TARTRATE 5 MG/5ML IV SOLN
10.0000 mg | Freq: Four times a day (QID) | INTRAVENOUS | Status: DC
  Administered 2020-09-01 – 2020-09-04 (×11): 10 mg via INTRAVENOUS
  Filled 2020-09-01 (×11): qty 10

## 2020-09-01 MED ORDER — TRAVASOL 10 % IV SOLN
INTRAVENOUS | Status: AC
  Filled 2020-09-01: qty 600

## 2020-09-01 NOTE — Progress Notes (Signed)
Subjective: No complaints.  Objective: Vital signs in last 24 hours: Temp:  [97.5 F (36.4 C)-98.3 F (36.8 C)] 98.3 F (36.8 C) (10/15 1255) Pulse Rate:  [89-98] 98 (10/15 1302) Resp:  [13-16] 16 (10/15 1302) BP: (148-165)/(69-113) 164/113 (10/15 1302) SpO2:  [98 %-100 %] 99 % (10/15 1302) Last BM Date: 09/01/20  Intake/Output from previous day: 10/14 0701 - 10/15 0700 In: 720 [P.O.:720] Out: 1800 [Urine:1800] Intake/Output this shift: Total I/O In: 180 [P.O.:180] Out: 525 [Urine:525]  General appearance: alert and no distress Resp: clear to auscultation bilaterally Cardio: regular rate and rhythm GI: distended, soft, + BS - no change from yesterday Extremities: 3+ Edema  Lab Results: Recent Labs    08/30/20 0439 08/31/20 0439 09/01/20 0455  WBC 9.7 11.1* 15.9*  HGB 7.5* 7.5* 7.4*  HCT 23.8* 24.4* 23.8*  PLT 146* 143* 174   BMET Recent Labs    08/30/20 0439 08/31/20 0439 09/01/20 0455  NA 134* 135 140  K 3.5 3.5 3.2*  CL 104 104 109  CO2 22 21* 23  GLUCOSE 193* 398* 210*  BUN 15 20 22*  CREATININE 0.64 0.66 0.65  CALCIUM 8.2* 8.6* 8.6*   LFT Recent Labs    08/31/20 0439  PROT 6.3*  ALBUMIN 2.3*  AST 40  ALT 56*  ALKPHOS 119  BILITOT 0.5   PT/INR No results for input(s): LABPROT, INR in the last 72 hours. Hepatitis Panel No results for input(s): HEPBSAG, HCVAB, HEPAIGM, HEPBIGM in the last 72 hours. C-Diff No results for input(s): CDIFFTOX in the last 72 hours. Fecal Lactopherrin No results for input(s): FECLLACTOFRN in the last 72 hours.  Studies/Results: DG Chest 2 View  Result Date: 08/31/2020 CLINICAL DATA:  Fever EXAM: CHEST - 2 VIEW COMPARISON:  August 29, 2020 FINDINGS: Lungs are clear. Heart is borderline enlarged with pulmonary vascularity normal. No adenopathy. Central catheter tip is in the superior vena cava. No pneumothorax. There is degenerative change in the thoracic spine. IMPRESSION: Borderline cardiac enlargement.  Lungs clear. Central catheter tip in superior vena cava. Electronically Signed   By: Lowella Grip III M.D.   On: 08/31/2020 10:19   DG Abd 2 Views  Result Date: 09/01/2020 CLINICAL DATA:  Ileus. EXAM: ABDOMEN - 2 VIEW COMPARISON:  August 29, 2020. FINDINGS: Stable dilated large and small bowel loops are noted concerning for ileus. There is no evidence of free air. No radio-opaque calculi or other significant radiographic abnormality is seen. IMPRESSION: Stable dilated large and small bowel loops are noted concerning for ileus. Electronically Signed   By: Marijo Conception M.D.   On: 09/01/2020 12:31    Medications:  Scheduled: . (feeding supplement) PROSource Plus  30 mL Oral BID BM  . Chlorhexidine Gluconate Cloth  6 each Topical Daily  . feeding supplement (KATE FARMS STANDARD 1.4)  325 mL Oral Daily  . insulin aspart  0-20 Units Subcutaneous Q4H  . insulin glargine  7 Units Subcutaneous QHS  . methylPREDNISolone (SOLU-MEDROL) injection  40 mg Intravenous Daily  . metoCLOPramide (REGLAN) injection  5 mg Intravenous Q8H  . metoprolol tartrate  10 mg Intravenous Q6H  . pantoprazole (PROTONIX) IV  40 mg Intravenous Q24H  . polyethylene glycol  17 g Oral Daily  . sodium chloride flush  10-40 mL Intracatheter Q12H   Continuous: . sodium chloride 10 mL/hr at 08/30/20 0200  . ceFEPime (MAXIPIME) IV 2 g (09/01/20 0920)  . heparin 1,650 Units/hr (09/01/20 0308)  . TPN ADULT (ION) 50 mL/hr  at 08/31/20 1718  . TPN ADULT (ION)      Assessment/Plan: 1) Ileus. 2) Elevated WBC - no fever. 3) Anemia - Stable.   He reports routine bowel movements and flatus.  His abdomen is unchanged from the examination yesterday.  It is soft to palpation, but distended.  He is tolerating a soft diet and he will be advanced to a regular diet.  As for his anemia, there is a slow drift downwards without any overt signs of bleeding.  An EGD/colonoscopy is warranted, but prepping him may be too much for his  ileus to handle.  Plan: 1) Advance to a regular diet. 2) Continue with antibotics. 3) Transfuse if necessary.  LOS: 20 days   Taraneh Metheney D 09/01/2020, 3:40 PM

## 2020-09-01 NOTE — Progress Notes (Addendum)
PROGRESS NOTE    [name]  VOZ:[id number] DOB: Feb 16, 1961 DOA: 08/10/2020 PCP: Charlott Rakes, MD    Chief Complaint  Patient presents with  . Leg Swelling  . Medical Clearance    rehab placement     Brief Narrative:  [name] is a 59 yo male with past medical history significant of type 2 diabetes, chronic gout who was recently hospitalized 8/11-8/24 with AKI, at that time found to have left upper extremity DVT in addition to polyarticular gout.  He now comes in with sepsis, UTI, bowel obstruction. He has been on IV heparin due to LUE DVT as well as TPN due to ileus.     Assessment & Plan:   Principal Problem:   Ileus ([place]) Active Problems:   Essential hypertension   Gout   Iron deficiency anemia   Elevated troponin   Generalized weakness   Uncontrolled type 2 diabetes mellitus with hyperglycemia, without long-term current use of insulin (HCC)   Acute deep vein thrombosis (DVT) of brachial vein of left upper extremity (HCC)   Hypokalemia   CKD (chronic kidney disease) stage 2, GFR 60-89 ml/min   Polyarticular gout   Nasogastric tube present   Acidosis, metabolic   Sepsis due to Escherichia coli without acute organ dysfunction ([place])  1 colonic and small bowel 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  Tolerating full liquid diet at this time.  Patient stated had a large bowel movement on 08/15/2020, passing flatus.  Patient stated had a large bowel movement (08/18/2020) however per RN patient with mucus.  Patient also with some nausea and emesis on 08/18/2020. Per RN patient has not had any significant bowel movement since 08/15/2020. Patient stated ambulated in hallways on 08/15/2020 with therapy. Abdominal films done with stable diffuse colonic dilatation is noted most  consistent with colonic ileus/adynamic ileus with progression and colonic pneumatosis..  CT abdomen and pelvis done 08/14/2020 with findings consistent with colonic ileus, pneumatosis of the ascending and hepatic flexure of the colon.  No associated colonic wall thickening or portal venous gas.  Normal small bowel without obstruction.  Hepatic steatosis.  Bilateral renal parenchymal atrophy with lobular contours.  Mild bladder wall thickening.  Patient with no significant change with abdominal distention.  Abdominal films with persistent gaseous distention of large and small bowel loops.  Patient seen by GI and NG tube ordered however unable to be placed per RN.  NG tube placed by GI under guidewire.  Per RN after NG tube placed patient with some relief of abdominal discomfort.  General surgery was following and initially recommended mobilization and GI decompression.  General surgery ordered neostigmine x 1 and per RN patient noted to have watery loose stools after neostigmine was given.  Rectal tube has been ordered per GI.  Repeat abdominal films with unchanged gaseous distention of small bowel and colon compatible with ileus.    NG tube has subsequently been discontinued.  PICC line placed and patient was started on TPN.    General surgery was following but signed off 08/22/2020.  Patient with TPN and once tolerating oral intake will start to wean down TPN.  Patient was placed on a full liquid diet per Dr. Benson Norway who had recommended advancement to a soft diet however patient noted to have some abdominal distention and nausea when placed on a soft diet and was placed  back on a full liquid diet.  Diet advanced to a soft diet 08/31/2020 which patient seems to be tolerating.  Continue soft diet for another 24 hours and advance to a regular diet tomorrow.  Keep potassium > 4, magnesium > 2.  Supportive care.  Abdominal films pending.  GI following.   2.  Polyarticular gout with osteoarthritis/acute gouty  arthritis It is noted that patient had previous work-up with rheumatoid factor and ANA which were negative.  Steroids held on admission.  Allopurinol discontinued on admission.  Colchicine on hold.  Per RN patient with complaints of exquisite right hand pain concerning for an acute gouty flare on 08/30/2020.  Improving clinically.  Patient given a dose of IV Solu-Medrol and placed on IV Solu-Medrol 40 mg daily x2 more days.  Outpatient follow-up with rheumatology.  3.  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  Patient noted to have been on 70/30 insulin 50 units twice daily.  CBG at 332 this morning.  Increase blood glucose levels likely due to initiation of steroids due to concern for acute gouty flare.  Patient on TPN and insulin being adjusted per pharmacy.  Increase Lantus to 10 units daily.  Follow.  4.  Hypokalemia/hypomagnesemia Potassium at 3.2.  Magnesium at 2.2.  Patient on TPN and electrolytes being repleted per pharmacy.  K. Dur 40 mEq p.o. every 4 hours x2 doses.   5.  Left upper extremity DVT Was on apixaban twice daily.  Patient currently n.p.o. due to colonic ileus.  IV heparin for anticoagulation.  Once on a diet consistently will resume apixaban.  Follow.   6.  Iron deficiency anemia Status post IV Feraheme 08/12/2020.    Hemoglobin stable at 7.4.  Likely dilutional effect.  Will need to resume oral iron supplementation on discharge.  Transfusion threshold hemoglobin < 7.  7.  Sepsis secondary to E. coli UTI, Pseudomonas aeruginosa, POA Patient noted to have urine cultures are growing 100,000 colonies of E. coli was initially on Keflex and subsequently transitioned to IV Ancef as patient noted to be n.p.o.  Patient finished a full course of antibiotics on 08/19/2020.  Patient however was having fevers on 08/26/2020 and repeat urinalysis concerning for UTI.  Patient started on IV Rocephin 08/26/2020, blood cultures done 08/26/2020 with no  growth to date.  Urine cultures were positive for E. coli as well as Pseudomonas aeruginosa.  Patient noted to have a fever again on 08/29/2020 with tachycardia and due to concerns for possible pneumonia azithromycin added to regimen of IV Rocephin.  Cultures did show Pseudomonas and as such IV Rocephin has been discontinued and patient started on IV cefepime 08/30/2020.  Patient afebrile x24 hours.  Repeat chest x-ray done negative for any acute infiltrate.  Discontinue azithromycin.  Continue empiric IV cefepime.  Follow.  8.  Fevers Patient noted to have ongoing fevers for the past few days.  Patient initially with fever on 08/26/2020 with repeat urinalysis concerning for UTI.  Patient placed back on IV Rocephin 08/26/2020 and pancultured.  Urine cultures positive for E. coli as well as Pseudomonas and as such antibiotics were broadened to IV cefepime as patient noted to have fevers again on 08/29/2020.  Patient with fevers 08/30/2020.  Chest x-ray done unremarkable.  Blood cultures repeated and pending with no growth to date.  Afebrile x24 hours after antibiotic coverage was broadened to cefepime and patient started on treatment for acute gout.  Will discontinue azithromycin.  Follow.  9.  Prior bilateral knee  surgeries PT/OT.  SNF placement.  10.  Chronic kidney disease stage II Stable.     11.  Metabolic acidosis Patient noted to be acidotic.  Concern for abdominal ischemia as patient with significant colonic ileus with no significant improvement.  Lactic acid level within normal limits.  Acidosis resolved with bicarb drip.  Bicarb drip has been discontinued.  Follow.  12.  Nutrition Due to patient's colonic distention/ileus patient had been n.p.o. for several days early on in the hospitalization.    Status post NG tube and rectal tube placement.  PICC line placed and patient has been started on TPN per pharmacy.  Patient diet advanced to a soft diet which he tolerated.  We will continue soft  diet through today and transition to a regular diet tomorrow.  Follow.   13.  Severe onchogryphosis  14.  QTC prolongation Per RN patient with QTC prolongation of 550.  Repeat EKG 08/22/2020 with resolution of QTC prolongation.  Keep potassium > 4, magnesium > 2.     DVT prophylaxis: Heparin Code Status: Full Family Communication: Updated patient.  No family at bedside.  Disposition:   Status is: Inpatient    Dispo:  Patient From:    Planned Disposition: [facility]  Expected discharge date: 09/04/20  Medically stable for discharge:  No        Consultants:   Gastroenterology: Dr. Collene Mares 08/14/2020  General surgery: Dr. Hassell Done 08/14/2020  Procedures:   CT angiogram chest 08/10/2020  CT abdomen and pelvis 08/14/2020  2D echo 08/11/2020  Abdominal films 08/18/2020, 08/17/2020, 08/16/2020, 08/14/2020  PICC line placement  Antimicrobials:  Anti-infectives (From admission, onward)   Start     Dose/Rate Route Frequency Ordered Stop   08/30/20 1100  ceFEPIme (MAXIPIME) 2 g in sodium chloride 0.9 % 100 mL IVPB        2 g 200 mL/hr over 30 Minutes Intravenous Every 12 hours 08/30/20 1003     08/29/20 1030  azithromycin (ZITHROMAX) 500 mg in sodium chloride 0.9 % 250 mL IVPB        500 mg 250 mL/hr over 60 Minutes Intravenous Every 24 hours 08/29/20 0940     08/26/20 2300  cefTRIAXone (ROCEPHIN) 1 g in sodium chloride 0.9 % 100 mL IVPB  Status:  Discontinued        1 g 200 mL/hr over 30 Minutes Intravenous Every 24 hours 08/26/20 2209 08/30/20 0956   08/15/20 1600  ceFAZolin (ANCEF) IVPB 1 g/50 mL premix        1 g 100 mL/hr over 30 Minutes Intravenous Every 8 hours 08/15/20 1429 08/19/20 2243   08/14/20 1730  cefTRIAXone (ROCEPHIN) 1 g in sodium chloride 0.9 % 100 mL IVPB  Status:  Discontinued        1 g 200 mL/hr over 30 Minutes Intravenous Every 24 hours 08/14/20 1647 08/15/20 1429   08/12/20 1230  cephALEXin (KEFLEX) capsule 500 mg  Status:   Discontinued        500 mg Oral Every 12 hours 08/12/20 1139 08/14/20 1647       Subjective: Patient sitting up in chair.  Denies any nausea or emesis.  States he tolerated soft diet.  Passing flatus.  Patient noted to have a mostly soft stool per nurse tech this morning.  No chest pain.  No shortness of breath.  Afebrile.   Objective: Vitals:   08/31/20 1700 08/31/20 2200 09/01/20 0200 09/01/20 0600  BP: (!) 165/101 (!) 150/76 (!) 148/69  Pulse: 96 90 89   Resp: 16 13 16    Temp: (!) 97.5 F (36.4 C) 98 F (36.7 C) 98.1 F (36.7 C) 98.1 F (36.7 C)  TempSrc: Oral Oral Oral Oral  SpO2: 100% 98% 98%   Weight:      Height:        Intake/Output Summary (Last 24 hours) at 09/01/2020 0958 Last data filed at 09/01/2020 0805 Gross per 24 hour  Intake 780 ml  Output 1975 ml  Net -1195 ml   Filed Weights   08/21/20 0500 08/22/20 0509 08/28/20 1820  Weight: 108.4 kg 105.3 kg 103.3 kg    Examination:  General exam: NAD Respiratory system: Lungs clear to auscultation bilaterally.  No wheezes, no crackles, no rhonchi.  Normal respiratory effort.  Speaking in full sentences. Cardiovascular system: Regular rate rhythm no murmurs rubs or gallops.  No JVD.  Trace bilateral lower extremity edema.   Gastrointestinal system: Abdomen is distended, soft, nontender to palpation, positive bowel sounds.  No rebound.  No guarding. Central nervous system: Alert and oriented. No focal neurological deficits. Extremities: Right hand swelling decreased, tenderness to palpation decreased.  Symmetric 5 x 5 power. Skin: No rashes, lesions or ulcers Psychiatry: Judgement and insight appear normal. Mood & affect appropriate.     Data Reviewed: I have personally reviewed following labs and imaging studies  CBC: Recent Labs  Lab 08/28/20 0400 08/29/20 0425 08/30/20 0439 08/31/20 0439 09/01/20 0455  WBC 7.7 9.0 9.7 11.1* 15.9*  NEUTROABS 4.7  --   --   --   --   HGB 7.7* 7.6* 7.5* 7.5* 7.4*   HCT 25.0* 24.6* 23.8* 24.4* 23.8*  MCV 79.6* 78.6* 78.5* 79.5* 78.3*  PLT 154 148* 146* 143* 150    Basic Metabolic Panel: Recent Labs  Lab 08/26/20 0415 08/27/20 0311 08/28/20 0400 08/29/20 0425 08/30/20 0439 08/31/20 0439 09/01/20 0455  NA 135   < > 134* 132* 134* 135 140  K 4.1   < > 4.3 4.6 3.5 3.5 3.2*  CL 104   < > 104 104 104 104 109  CO2 23   < > 22 21* 22 21* 23  GLUCOSE 170*   < > 197* 294* 193* 398* 210*  BUN 14   < > 15 15 15 20  22*  CREATININE 0.69   < > 0.70 0.69 0.64 0.66 0.65  CALCIUM 8.4*   < > 8.6* 8.3* 8.2* 8.6* 8.6*  MG 1.9   < > 1.8 1.8 2.1 2.1 2.2  PHOS 3.8  --  3.5 3.6 3.9 3.2  --    < > = values in this interval not displayed.    GFR: Estimated Creatinine Clearance: 108.6 mL/min (by C-G formula based on SCr of 0.65 mg/dL).  Liver Function Tests: Recent Labs  Lab 08/26/20 0415 08/28/20 0400 08/29/20 0425 08/30/20 0439 08/31/20 0439  AST 24 30 33 36 40  ALT 23 33 41 47* 56*  ALKPHOS 71 84 95 110 119  BILITOT 0.4 0.7 0.4 0.6 0.5  PROT 5.6* 5.9* 6.0* 5.9* 6.3*  ALBUMIN 2.6* 2.5* 2.5* 2.4* 2.3*    CBG: Recent Labs  Lab 08/31/20 1559 08/31/20 2008 09/01/20 0007 09/01/20 0421 09/01/20 0748  GLUCAP 360* 369* 225* 198* 223*     Recent Results (from the past 240 hour(s))  Culture, blood (routine x 2)     Status: None   Collection Time: 08/26/20  2:27 PM   Specimen: BLOOD  Result Value Ref Range Status  Specimen Description   Final    BLOOD LEFT FOOT Performed at [facility] [address], [place], [place] [address]    Special Requests   Final    BOTTLES DRAWN AEROBIC AND ANAEROBIC Blood Culture adequate volume Performed at [facility] [address], [place], [place] [address]    Culture   Final    NO GROWTH 5 DAYS Performed at [facility], [place] [address], [place], [place] [address]    Report Status 08/31/2020 FINAL  Final  Culture, Urine     Status: Abnormal   Collection  Time: 08/26/20  5:44 PM   Specimen: Urine, Clean Catch  Result Value Ref Range Status   Specimen Description URINE, CLEAN CATCH  Final   Special Requests NONE  Final   Culture (A)  Final    >=100,000 COLONIES/mL ESCHERICHIA COLI >=100,000 COLONIES/mL PSEUDOMONAS AERUGINOSA    Report Status 08/31/2020 FINAL  Final   Organism ID, Bacteria ESCHERICHIA COLI (A)  Final   Organism ID, Bacteria PSEUDOMONAS AERUGINOSA (A)  Final      Susceptibility   Escherichia coli - MIC*    AMPICILLIN 4 SENSITIVE Sensitive     CEFAZOLIN <=4 SENSITIVE Sensitive     CEFTRIAXONE <=0.25 SENSITIVE Sensitive     CIPROFLOXACIN <=0.25 SENSITIVE Sensitive     GENTAMICIN <=1 SENSITIVE Sensitive     IMIPENEM <=0.25 SENSITIVE Sensitive     NITROFURANTOIN <=16 SENSITIVE Sensitive     TRIMETH/SULFA <=20 SENSITIVE Sensitive     AMPICILLIN/SULBACTAM <=2 SENSITIVE Sensitive     PIP/TAZO <=4 SENSITIVE Sensitive     * >=100,000 COLONIES/mL ESCHERICHIA COLI   Pseudomonas aeruginosa - MIC*    CEFTAZIDIME 4 SENSITIVE Sensitive     CIPROFLOXACIN <=0.25 SENSITIVE Sensitive     GENTAMICIN <=1 SENSITIVE Sensitive     IMIPENEM 1 SENSITIVE Sensitive     PIP/TAZO <=4 SENSITIVE Sensitive     CEFEPIME 2 SENSITIVE Sensitive     * >=100,000 COLONIES/mL PSEUDOMONAS AERUGINOSA  Culture, blood (Routine X 2) w Reflex to ID Panel     Status: None (Preliminary result)   Collection Time: 08/31/20 10:31 AM   Specimen: BLOOD  Result Value Ref Range Status   Specimen Description   Final    BLOOD LEFT FOOT Performed at [facility], [place] [address], [place], [place] [address]    Special Requests   Final    BOTTLES DRAWN AEROBIC AND ANAEROBIC Blood Culture adequate volume Performed at [facility] [address], [place], [place] [address]    Culture   Final    NO GROWTH < 24 HOURS Performed at [facility] [address], [place], [place] [address]    Report Status PENDING  Incomplete   Culture, blood (Routine X 2) w Reflex to ID Panel     Status: None (Preliminary result)   Collection Time: 08/31/20 10:46 AM   Specimen: BLOOD  Result Value Ref Range Status   Specimen Description   Final    BLOOD RIGHT FOOT Performed at [facility] [address] [place], [place] [address]    Special Requests   Final    BOTTLES DRAWN AEROBIC AND ANAEROBIC Blood Culture adequate volume Performed at [facility] [address] [place], [place] [address]    Culture   Final    NO GROWTH < 24 HOURS Performed at [facility] [place],  [place] [address]    Report Status PENDING  Incomplete         Radiology Studies: DG Chest 2 View  Result Date: 08/31/2020 CLINICAL DATA:  Fever EXAM: CHEST - 2 VIEW COMPARISON:  August 29, 2020 FINDINGS: Lungs are clear. Heart is borderline enlarged with pulmonary vascularity normal. No adenopathy. Central catheter tip is in the superior vena cava. No pneumothorax. There is degenerative change in the thoracic spine. IMPRESSION: Borderline cardiac enlargement. Lungs clear. Central catheter tip in superior vena cava. Electronically Signed   By: Lowella Grip III M.D.   On: 08/31/2020 10:19        Scheduled Meds: . (feeding supplement) PROSource Plus  30 mL Oral BID BM  . Chlorhexidine Gluconate Cloth  6 each Topical Daily  . feeding supplement (KATE FARMS STANDARD 1.4)  325 mL Oral Daily  . insulin aspart  0-20 Units Subcutaneous Q4H  . insulin glargine  7 Units Subcutaneous QHS  . methylPREDNISolone (SOLU-MEDROL) injection  40 mg Intravenous Daily  . metoCLOPramide (REGLAN) injection  5 mg Intravenous Q8H  . metoprolol tartrate  7.5 mg Intravenous Q6H  . pantoprazole (PROTONIX) IV  40 mg Intravenous Q24H  . polyethylene glycol  17 g Oral Daily  . potassium chloride  40 mEq Oral Q4H  . sodium chloride flush  10-40 mL Intracatheter Q12H   Continuous Infusions: . sodium  chloride 10 mL/hr at 08/30/20 0200  . azithromycin 500 mg (08/31/20 1047)  . ceFEPime (MAXIPIME) IV 2 g (09/01/20 0920)  . heparin 1,650 Units/hr (09/01/20 0308)  . TPN ADULT (ION) 50 mL/hr at 08/31/20 1718     LOS: 20 days    Time spent: 40 minutes    Irine Seal, MD [facility]   To contact the attending provider between 7A-7P or the covering provider during after hours 7P-7A, please log into the web site [email or web address] and access using universal [place] password for that web site. If you do not have the password, please call the hospital operator.  09/01/2020, 9:58 AM

## 2020-09-01 NOTE — Progress Notes (Signed)
Physical Therapy Treatment Patient Details Name: [name] MRN: [id number] DOB: 1961-11-14 Today's Date: 09/01/2020    History of Present Illness 59 yo male admitted to [facility] on 9/24 with weakness, joint pain, difficulty urinating. Pt meeting SIRS criteria, possibly secondary to urinary infection. NGT placed 10/1 for ileus. Now clamped. PMHx significant for chronic gouty arthritis of multiple joints, CKD II, IDDM, HTN, HLD, and chronic anemia. Admitted to [facility] 8/11-8/24 for AKI, LUE DVT, and joint pain, d/c SNF.    PT Comments    Pt requiring decr assist overall today,able to use RW (rather than EVA) today.  incr gait distance however remains limited d/t elevated HR/tachy. Resting HR in 90s, max HR 168 with amb--just before seated rest, recovers to 110s in 1 minute. Denies dyspnea or CP.   Pt is more vocal this session. Cautioned pt regarding slowing speed of movement especially given HR elevation. Continue PT in acute setting. Continue to recommend SNF but will assess for any changes in future sessions.  Follow Up Recommendations  SNF     Equipment Recommendations  None recommended by PT    Recommendations for Other Services       Precautions / Restrictions Precautions Precautions: Fall Precaution Comments: multiple lines  Restrictions Weight Bearing Restrictions: No    Mobility  Bed Mobility               General bed mobility comments: OOB in recliner  Transfers Overall transfer level: Needs assistance Equipment used: Rolling walker (2 wheeled) Transfers: Sit to/from Stand Sit to Stand: Min assist;Min guard         General transfer comment: min to min guard assist to safely rise and transition to RW,  cues for hand placement   Ambulation/Gait Ambulation/Gait assistance: Min assist;Min guard;+2 safety/equipment Gait Distance (Feet): 40 Feet (x2) Assistive device: Rolling walker (2 wheeled) Gait Pattern/deviations: Step-through pattern;Decreased stride  length;Wide base of support Gait velocity: decreased   General Gait Details: distance limited by incr HR, improved gait stability, intermittent assist to amneuver RW around obstacles in [employer]             Wheelchair Mobility    Modified Rankin (Stroke Patients Only)       Balance   Sitting-balance support: Feet supported;No upper extremity supported Sitting balance-Leahy Scale: Fair     Standing balance support: Bilateral upper extremity supported;During functional activity Standing balance-Leahy Scale: Fair Standing balance comment: able to static stand with close supervision                             Cognition Arousal/Alertness: Awake/alert Behavior During Therapy: [facility] for tasks assessed/performed;Flat affect (easily agitated today )                           Following Commands: Follows one step commands consistently;Follows multi-step commands with increased time       General Comments: processing time improved, easily agitated by safety cues       Exercises      General Comments        Pertinent Vitals/Pain Pain Assessment: No/denies pain    Home Living                      Prior Function            PT Goals (current goals can now be found in the care  plan section) Acute Rehab PT Goals Patient Stated Goal: get stronger PT Goal Formulation: With patient Time For Goal Achievement: 08/26/20 Potential to Achieve Goals: Good Progress towards PT goals: Progressing toward goals    Frequency    Min 2X/week      PT Plan Current plan remains appropriate    Co-evaluation              AM-PAC PT "6 Clicks" Mobility   Outcome Measure  Help needed turning from your back to your side while in a flat bed without using bedrails?: A Little Help needed moving from lying on your back to sitting on the side of a flat bed without using bedrails?: A Little Help needed moving to and from a bed to a chair  (including a wheelchair)?: A Little Help needed standing up from a chair using your arms (e.g., wheelchair or bedside chair)?: A Little Help needed to walk in hospital room?: A Little Help needed climbing 3-5 steps with a railing? : A Little 6 Click Score: 18    End of Session         PT Visit Diagnosis: Muscle weakness (generalized) (M62.81);Other abnormalities of gait and mobility (R26.89)     Time: 8115-7262 PT Time Calculation (min) (ACUTE ONLY): 29 min  Charges:  $Gait Training: 23-37 mins                     Baxter Flattery, PT  Acute Rehab Dept ([place]) [phone] Pager [phone]  09/01/2020    [facility] 09/01/2020, 12:13 PM

## 2020-09-01 NOTE — Progress Notes (Addendum)
PHARMACY - TOTAL PARENTERAL NUTRITION CONSULT NOTE   Indication: Prolonged ileus  Patient Measurements: Height: 5\' 5"  (165.1 cm) Weight: 103.3 kg (227 lb 11.8 oz) IBW/kg (Calculated) : 61.5 TPN AdjBW (KG): 72.7 Body mass index is 37.9 kg/m.  Assessment:  59 yo male with PMH gout, OA, DM2, anemia admitted with upper extremity DVT and development of colonic ileus. GI following, recommend NGT to LIWS, s/p neostigmine 10/1, no surgical indications at this point unless he perforates or has ischemia, have signed off 10/4.   Glucose / Insulin: Hx DM, A1c 7.18 Jun 2020 - CBGs shot up 10/13 (300-400) after starting solumedrol for gout attack, although control prior to this was still fairly poor with CBGs in 200-250 range.  - 81 units of resistant SSI given yesterday  - 28 units regular insulin in TPN Electrolytes: K lower today despite replenishment yesterday (likely also d/t steroid start), otherwise stable WNL - goal K >/= 4.0, Mg >/= 2.0 with ileus Renal: Hx CKD3; SCr, BUN stable WNL; UOP remains adequate Hepatic: LFTs WNL; tbili stable WNL; albumin remains low but stable Prealbumin: low but slowly improving TG: stable WNL I/O: NGT removed 10/8 - advanced back to soft diet, failed previous advancement on 10/12 but eating better today - MIVF: NS at [facility].  Remains on IV reglan q8h GI Imaging: - 9/27 CT abd/pelvis: findings consistent with colonic ileus, pneumatosis of the ascending and hepatic flexure of the colon. - 10/3 AXR:  stable diffuse colonic dilatation is noted most consistent with colonic ileus/adynamic ileus with progression and colonic pneumatosis. - 10/9 AXR: Unchanged gaseous dilation of loops of large and small bowel. - 10/12 AXR: Persistent dilated loops of small bowel, compatible with small bowel obstruction. Surgeries / Procedures:   Central access: PICC TPN start date: 10/5  Nutritional Goals (per RD recommendation on 10/6): Kcal:  1850-2050 Protein:  95-110g Fluid:   2L/day  Goal TPN rate is 90 mL/hr (provides 108 g of protein and 2181 kcals per day)  Current Nutrition:  Diet advanced soft 10/14 Per RN eating 20-30% of meals trays, but essentially all of [employer] 1.4 daily and ProSource Plus BID  Plan:   Kdur 40 mEq PO x 2 per MD  Today at 1800:  Continue TPN at 50 ml/hr - discussed with MD and will leave at 1/2 rate until consistently taking POs  Electrolytes in TPN: increase K  125 mEq/L of Na  65 mEq/L of K  5 mEq/L of Ca  12 mEq/L of Mg  15 mmol/L of Phos   Cl:Ac ratio max Ac  Standard MVI and trace elements to TPN  Continue resistant scale SSI with q4h CBG checks   Increase insulin in TPN to 45 units; f/u needs carefully as steroid dose adjusted; continue Lantus 7 units qhs  Continue MIVF at [place] labs on Mon/Thurs  [place], PharmD, BCPS [phone] 09/01/2020, 8:34 AM

## 2020-09-01 NOTE — Progress Notes (Signed)
[place] for IV heparin (on apixaban PTA) Indication: history of DVT  Allergies  Allergen Reactions  . Hctz [Hydrochlorothiazide]     Hx of gout; uric acid 14.1 on 03/13/20 Acute gout L elbow 8/11-8/24/21    Patient Measurements: Height: 5\' 5"  (165.1 cm) Weight: 103.3 kg (227 lb 11.8 oz) IBW/kg (Calculated) : 61.5 Heparin Dosing Weight: 85.7 kg  Vital Signs: Temp: 98.1 F (36.7 C) (10/15 0600) Temp Source: Oral (10/15 0600) BP: 148/69 (10/15 0200) Pulse Rate: 89 (10/15 0200)  Labs: Recent Labs    08/30/20 0439 08/30/20 0439 08/30/20 1412 08/30/20 2147 08/31/20 0439 09/01/20 0455  HGB 7.5*   < >  --   --  7.5* 7.4*  HCT 23.8*  --   --   --  24.4* 23.8*  PLT 146*  --   --   --  143* 174  HEPARINUNFRC 0.23*  --    < > 0.37 0.[name] 0.63  CREATININE 0.64  --   --   --  0.66 0.65   < > = values in this interval not displayed.    Estimated Creatinine Clearance: 108.6 mL/min (by C-G formula based on SCr of 0.65 mg/dL).  Assessment: [name] with PMH of DVT (07/03/2020) on Apixaban PTA admitted on 08/10/2020 with SIRS. PO meds held d/t colonic ileus. Pharmacy consulted for IV heparin dosing while Apixaban on hold.   09/01/20 8:24 AM   Daily heparin level remains therapeutic on 1650 units/hr   CBC: Hgb low but stable; Pltc WNL  No bleeding or infusion issues noted  Goal of Therapy:  Heparin level 0.3-0.7 units/ml Monitor platelets by anticoagulation protocol: Yes   Plan:   Continue heparin infusion at 1650 units/hr  Daily heparin level and CBC  Monitor closely for signs and symptoms of bleeding  F/u ability to resume [place], PharmD, BCPS [phone] 09/01/2020, 8:24 AM

## 2020-09-02 DIAGNOSIS — E872 Acidosis: Secondary | ICD-10-CM | POA: Diagnosis not present

## 2020-09-02 DIAGNOSIS — N182 Chronic kidney disease, stage 2 (mild): Secondary | ICD-10-CM | POA: Diagnosis not present

## 2020-09-02 DIAGNOSIS — I82622 Acute embolism and thrombosis of deep veins of left upper extremity: Secondary | ICD-10-CM | POA: Diagnosis not present

## 2020-09-02 DIAGNOSIS — K567 Ileus, unspecified: Secondary | ICD-10-CM | POA: Diagnosis not present

## 2020-09-02 LAB — CBC
HCT: 25.5 % — ABNORMAL LOW (ref 39.0–52.0)
Hemoglobin: 8 g/dL — ABNORMAL LOW (ref 13.0–17.0)
MCH: 24.6 pg — ABNORMAL LOW (ref 26.0–34.0)
MCHC: 31.4 g/dL (ref 30.0–36.0)
MCV: 78.5 fL — ABNORMAL LOW (ref 80.0–100.0)
Platelets: 216 10*3/uL (ref 150–400)
RBC: 3.25 MIL/uL — ABNORMAL LOW (ref 4.22–5.81)
RDW: 22.6 % — ABNORMAL HIGH (ref 11.5–15.5)
WBC: 15.7 10*3/uL — ABNORMAL HIGH (ref 4.0–10.5)
nRBC: 0.7 % — ABNORMAL HIGH (ref 0.0–0.2)

## 2020-09-02 LAB — BASIC METABOLIC PANEL
Anion gap: 8 (ref 5–15)
BUN: 24 mg/dL — ABNORMAL HIGH (ref 6–20)
CO2: 23 mmol/L (ref 22–32)
Calcium: 8.6 mg/dL — ABNORMAL LOW (ref 8.9–10.3)
Chloride: 111 mmol/L (ref 98–111)
Creatinine, Ser: 0.71 mg/dL (ref 0.61–1.24)
GFR, Estimated: >60 mL/min (ref >60)
Glucose, Bld: 168 mg/dL — ABNORMAL HIGH (ref 70–99)
Potassium: 3.4 mmol/L — ABNORMAL LOW (ref 3.5–5.1)
Sodium: 142 mmol/L (ref 135–145)

## 2020-09-02 LAB — HEPARIN LEVEL (UNFRACTIONATED)
Heparin Unfractionated: 0.92 IU/mL — ABNORMAL HIGH (ref 0.30–0.70)
Heparin Unfractionated: 1.01 IU/mL — ABNORMAL HIGH (ref 0.30–0.70)
Heparin Unfractionated: 0.98 IU/mL — ABNORMAL HIGH (ref 0.30–0.70)

## 2020-09-02 LAB — MAGNESIUM: Magnesium: 2.2 mg/dL (ref 1.7–2.4)

## 2020-09-02 LAB — GLUCOSE, CAPILLARY
Glucose-Capillary: 128 mg/dL — ABNORMAL HIGH (ref 70–99)
Glucose-Capillary: 163 mg/dL — ABNORMAL HIGH (ref 70–99)
Glucose-Capillary: 165 mg/dL — ABNORMAL HIGH (ref 70–99)
Glucose-Capillary: 172 mg/dL — ABNORMAL HIGH (ref 70–99)
Glucose-Capillary: 204 mg/dL — ABNORMAL HIGH (ref 70–99)
Glucose-Capillary: 230 mg/dL — ABNORMAL HIGH (ref 70–99)

## 2020-09-02 MED ORDER — TRAVASOL 10 % IV SOLN
INTRAVENOUS | Status: AC
  Filled 2020-09-02: qty 600

## 2020-09-02 MED ORDER — AMLODIPINE BESYLATE 5 MG PO TABS
5.0000 mg | ORAL_TABLET | Freq: Every day | ORAL | Status: DC
  Administered 2020-09-02 – 2020-09-03 (×2): 5 mg via ORAL
  Filled 2020-09-02 (×2): qty 1

## 2020-09-02 MED ORDER — HEPARIN (PORCINE) 25000 UT/250ML-% IV SOLN
1150.0000 [IU]/h | INTRAVENOUS | Status: DC
  Administered 2020-09-02: 1550 [IU]/h via INTRAVENOUS
  Administered 2020-09-03: 1350 [IU]/h via INTRAVENOUS
  Filled 2020-09-02: qty 250

## 2020-09-02 MED ORDER — POTASSIUM CHLORIDE 10 MEQ/100ML IV SOLN
10.0000 meq | INTRAVENOUS | Status: AC
  Administered 2020-09-02 (×4): 10 meq via INTRAVENOUS
  Filled 2020-09-02 (×4): qty 100

## 2020-09-02 NOTE — Progress Notes (Signed)
[place] for IV heparin (on apixaban PTA) Indication: history of DVT  Allergies  Allergen Reactions  . Hctz [Hydrochlorothiazide]     Hx of gout; uric acid 14.1 on 03/13/20 Acute gout L elbow 8/11-8/24/21    Patient Measurements: Height: 5\' 5"  (165.1 cm) Weight: 103.3 kg (227 lb 11.8 oz) IBW/kg (Calculated) : 61.5 Heparin Dosing Weight: 85.7 kg  Vital Signs: Temp: 98.1 F (36.7 C) (10/16 0300) Temp Source: Oral (10/16 0300) BP: 166/101 (10/16 0800) Pulse Rate: 69 (10/16 0800)  Labs: Recent Labs    08/31/20 0439 08/31/20 0439 09/01/20 0455 09/02/20 0440 09/02/20 0811  HGB 7.5*   < > 7.4* 8.0*  --   HCT 24.4*  --  23.8* 25.5*  --   PLT 143*  --  174 216  --   HEPARINUNFRC 0.47   < > 0.63 0.92* 1.01*  CREATININE 0.66  --  0.65 0.71  --    < > = values in this interval not displayed.    Estimated Creatinine Clearance: 108.6 mL/min (by C-G formula based on SCr of 0.71 mg/dL).  Assessment: 79 y/oM with PMH of DVT (07/03/2020) on Apixaban PTA admitted on 08/10/2020 with SIRS. PO meds held d/t colonic ileus. Pharmacy consulted for IV heparin dosing while Apixaban on hold.   09/02/20 9:07 AM   Heparin level 0.92 possibly inaccurate since drawn through PICC where heparin is also infusing; rechecked via peripheral draw  CBC: Hgb low but slightly improved 8.0; Pltc WNL  No bleeding or infusion issues noted  Goal of Therapy:  Heparin level 0.3-0.7 units/ml Monitor platelets by anticoagulation protocol: Yes   Plan:   Hold heparin drip x 1 hour, then decrease infusion to 1550 units/hr  Recheck heparin lever peripherally in 6hrs  Daily heparin level and CBC  Monitor closely for signs and symptoms of bleeding  F/u ability to resume PO Eliquis  Peggyann Juba, PharmD, [place]: [phone] 09/02/2020, 9:07 AM

## 2020-09-02 NOTE — Progress Notes (Signed)
PHARMACY - TOTAL PARENTERAL NUTRITION CONSULT NOTE   Indication: Prolonged ileus  Patient Measurements: Height: 5\' 5"  (165.1 cm) Weight: 103.3 kg (227 lb 11.8 oz) IBW/kg (Calculated) : 61.5 TPN AdjBW (KG): 72.7 Body mass index is 37.9 kg/m.  Assessment:  59 yo male with PMH gout, OA, DM2, anemia admitted with upper extremity DVT and development of colonic ileus. GI following, recommend NGT to LIWS, s/p neostigmine 10/1, no surgical indications at this point unless he perforates or has ischemia, have signed off 10/4.   Glucose / Insulin: Hx DM, A1c 7.18 Jun 2020 - CBGs shot up 10/13 (300-400) after starting solumedrol for gout attack, but now down 128 this AM after increasing insulin to 45 units in TPN last night.  - 21 units of resistant SSI given since new TPN bag hung - 45 units regular insulin in TPN Electrolytes: K slightly improved (3.4) after PO replenishment yesterday, others WNL including corrected calcium - goal K >/= 4.0, Mg >/= 2.0 with ileus Renal: Hx CKD3; SCr stable, BUN now slightly elevated; UOP remains adequate Hepatic: LFTs WNL; tbili stable WNL; albumin remains low but stable Prealbumin: low but slowly improving TG: stable WNL I/O: NGT removed 10/8 - advanced back to soft diet, failed previous advancement on 10/12 but only ate 50% meal - MIVF: NS at [facility].  Remains on IV reglan q8h GI Imaging: - 9/27 CT abd/pelvis: findings consistent with colonic ileus, pneumatosis of the ascending and hepatic flexure of the colon. - 10/3 AXR:  stable diffuse colonic dilatation is noted most consistent with colonic ileus/adynamic ileus with progression and colonic pneumatosis. - 10/9 AXR: Unchanged gaseous dilation of loops of large and small bowel. - 10/12 AXR: Persistent dilated loops of small bowel, compatible with small bowel obstruction. - 10/15 AXR: Stable dilated large and small bowel loops are noted concerning for ileus. Surgeries / Procedures:   Central access:  PICC TPN start date: 10/5  Nutritional Goals (per RD recommendation on 10/6): Kcal:  1850-2050 Protein:  95-110g Fluid:  2L/day  Goal TPN rate is 90 mL/hr (provides 108 g of protein and 2181 kcals per day)  Current Nutrition:  Diet advanced soft 10/14 Per RN eating 20-30% of meals trays, but essentially all of [employer] 1.4 daily and ProSource Plus BID  Plan:   KCl 57meq IV x 4 runs  Today at 1800:  Continue TPN at 50 ml/hr - leave at 1/2 rate until consistently taking POs  Electrolytes in TPN:   125 mEq/L of Na  65 mEq/L of K  5 mEq/L of Ca  12 mEq/L of Mg  15 mmol/L of Phos   Cl:Ac ratio max Ac  Standard MVI and trace elements to TPN  Continue resistant scale SSI with q4h CBG checks   Decrease insulin in TPN to 35 units; f/u needs carefully as steroid dose adjusted; continue Lantus 7 units qhs  Continue M[facility]F at [facility]   Monitor TPN labs on Mon/Thurs  [place], PharmD, [place]: [phone] 09/02/2020, 8:44 AM

## 2020-09-02 NOTE — Progress Notes (Signed)
PROGRESS NOTE    [name]  OYD:[id number] DOB: 08-Dec-1960 DOA: 08/10/2020 PCP: Charlott Rakes, MD    Chief Complaint  Patient presents with  . Leg Swelling  . Medical Clearance    rehab placement     Brief Narrative:  [name] is a 59 yo male with past medical history significant of type 2 diabetes, chronic gout who was recently hospitalized 8/11-8/24 with AKI, at that time found to have left upper extremity DVT in addition to polyarticular gout.  He now comes in with sepsis, UTI, bowel obstruction. He has been on IV heparin due to LUE DVT as well as TPN due to ileus.     Assessment & Plan:   Principal Problem:   Ileus ([place]) Active Problems:   Essential hypertension   Gout   Iron deficiency anemia   Elevated troponin   Generalized weakness   Uncontrolled type 2 diabetes mellitus with hyperglycemia, without long-term current use of insulin (HCC)   Acute deep vein thrombosis (DVT) of brachial vein of left upper extremity (HCC)   Hypokalemia   CKD (chronic kidney disease) stage 2, GFR 60-89 ml/min   Polyarticular gout   Nasogastric tube present   Acidosis, metabolic   Sepsis due to Escherichia coli without acute organ dysfunction ([place])  1 colonic and small bowel 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  Tolerating full liquid diet at this time.  Patient stated had a large bowel movement on 08/15/2020, passing flatus.  Patient stated had a large bowel movement (08/18/2020) however per RN patient with mucus.  Patient also with some nausea and emesis on 08/18/2020. Per RN patient has not had any significant bowel movement since 08/15/2020. Patient stated ambulated in hallways on 08/15/2020 with therapy. Abdominal films done with stable diffuse colonic dilatation is noted most  consistent with colonic ileus/adynamic ileus with progression and colonic pneumatosis..  CT abdomen and pelvis done 08/14/2020 with findings consistent with colonic ileus, pneumatosis of the ascending and hepatic flexure of the colon.  No associated colonic wall thickening or portal venous gas.  Normal small bowel without obstruction.  Hepatic steatosis.  Bilateral renal parenchymal atrophy with lobular contours.  Mild bladder wall thickening.  Patient with no significant change with abdominal distention.  Abdominal films with persistent gaseous distention of large and small bowel loops.  Patient seen by GI and NG tube ordered however unable to be placed per RN.  NG tube placed by GI under guidewire.  Per RN after NG tube placed patient with some relief of abdominal discomfort.  General surgery was following and initially recommended mobilization and GI decompression.  General surgery ordered neostigmine x 1 and per RN patient noted to have watery loose stools after neostigmine was given.  Rectal tube has been ordered per GI.  Repeat abdominal films with unchanged gaseous distention of small bowel and colon compatible with ileus.    NG tube has subsequently been discontinued.  PICC line placed and patient was started on TPN.    General surgery was following but signed off 08/22/2020.  Patient with TPN and once tolerating oral intake will start to wean down TPN.  Patient was placed on a full liquid diet per Dr. Benson Norway who had recommended advancement to a soft diet however patient noted to have some abdominal distention and nausea when placed on a soft diet and was placed  back on a full liquid diet.  Diet advanced to a soft diet 08/31/2020 which patient seems to be tolerating.  Diet advanced to regular diet 09/01/2020 which patient tolerated.  Patient having some bowel movements and passing gas.  Slowly improving clinically.  Keep potassium > 4, magnesium > 2.  Supportive care.  Per GI.  2.  Polyarticular gout with  osteoarthritis/acute gouty arthritis It is noted that patient had previous work-up with rheumatoid factor and ANA which were negative.  Steroids held on admission.  Allopurinol discontinued on admission.  Colchicine on hold.  Per RN patient with complaints of exquisite right hand pain concerning for an acute gouty flare on 08/30/2020.  Patient placed on IV Solu-Medrol with clinical improvement.  Outpatient follow-up with rheumatology.  3.  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  Patient noted to have been on 70/30 insulin 50 units twice daily.  CBG at 128 this morning.  Increase blood glucose levels likely due to initiation of steroids due to concern for acute gouty flare.  Patient on TPN and insulin being adjusted per pharmacy.  Follow.  4.  Hypokalemia/hypomagnesemia Potassium currently at 3.4.  Magnesium at 2.2.  Patient on TPN electrolytes being repleted per pharmacy.    5.  Left upper extremity DVT Was on apixaban twice daily.    Patient's diet is slowly being advanced.  Currently on IV heparin for anticoagulation.  Once patient tolerating diet consistently could likely resume him back on home regimen of apixaban hopefully in the next 24 to 48 hours.  Follow.  6.  Iron deficiency anemia Status post IV Feraheme 08/12/2020.    Hemoglobin stable at 7.4.  Likely dilutional effect.  Will need to resume oral iron supplementation on discharge.  Transfusion threshold hemoglobin < 7.  7.  Sepsis secondary to E. coli UTI, Pseudomonas aeruginosa, POA Patient noted to have urine cultures are growing 100,000 colonies of E. coli was initially on Keflex and subsequently transitioned to IV Ancef as patient noted to be n.p.o.  Patient finished a full course of antibiotics on 08/19/2020.  Patient however was having fevers on 08/26/2020 and repeat urinalysis concerning for UTI.  Patient started on IV Rocephin 08/26/2020, blood cultures done 08/26/2020 with no growth to  date.  Urine cultures were positive for E. coli as well as Pseudomonas aeruginosa.  Patient noted to have a fever again on 08/29/2020 with tachycardia and due to concerns for possible pneumonia azithromycin added to regimen of IV Rocephin.  Cultures did show Pseudomonas and as such IV Rocephin has been discontinued and patient started on IV cefepime 08/30/2020.  Patient afebrile x48 hours.  Repeat chest x-ray done negative for any acute infiltrate.  Azithromycin discontinued.  Continue empiric IV cefepime and treat for 5-7 days total.  Follow.    8.  Fevers Patient noted to have ongoing fevers for the past few days.  Patient initially with fever on 08/26/2020 with repeat urinalysis concerning for UTI.  Patient placed back on IV Rocephin 08/26/2020 and pancultured.  Urine cultures positive for E. coli as well as Pseudomonas and as such antibiotics were broadened to IV cefepime as patient noted to have fevers again on 08/29/2020.  Patient with fevers 08/30/2020.  Chest x-ray done unremarkable.  Blood cultures repeated and pending with no growth to date.  Afebrile x 48 hours after antibiotic coverage was broadened to cefepime and patient started on treatment for acute gout.  Azithromycin discontinued.  Follow.    9.  Prior bilateral knee  surgeries PT/OT.  SNF placement.  10.  Chronic kidney disease stage II Stable.     11.  Metabolic acidosis Patient noted to be acidotic.  Concern for abdominal ischemia as patient with significant colonic ileus with no significant improvement.  Lactic acid level within normal limits.  Acidosis resolved with bicarb drip.  Bicarb drip has been discontinued.  Follow.  12.  Nutrition Due to patient's colonic distention/ileus patient had been n.p.o. for several days early on in the hospitalization.    Status post NG tube and rectal tube placement.  PICC line placed and patient has been started on TPN per pharmacy.  Patient diet advanced to a regular diet yesterday which he  tolerated.  Follow.   13.  Severe onchogryphosis  14.  QTC prolongation Per RN patient with QTC prolongation of 550.  Repeat EKG 08/22/2020 with resolution of QTC prolongation.  Keep potassium > 4, magnesium > 2.     DVT prophylaxis: Heparin Code Status: Full Family Communication: Updated patient.  No family at bedside.  Disposition:   Status is: Inpatient    Dispo:  Patient From:    Planned Disposition: [place]  Expected discharge date: 09/04/20  Medically stable for discharge:  No        Consultants:   Gastroenterology: Dr. Collene Mares 08/14/2020  General surgery: Dr. Hassell Done 08/14/2020  Procedures:   CT angiogram chest 08/10/2020  CT abdomen and pelvis 08/14/2020  2D echo 08/11/2020  Abdominal films 08/18/2020, 08/17/2020, 08/16/2020, 08/14/2020  PICC line placement  Antimicrobials:  Anti-infectives (From admission, onward)   Start     Dose/Rate Route Frequency Ordered Stop   08/30/20 1100  ceFEPIme (MAXIPIME) 2 g in sodium chloride 0.9 % 100 mL IVPB        2 g 200 mL/hr over 30 Minutes Intravenous Every 12 hours 08/30/20 1003     08/29/20 1030  azithromycin (ZITHROMAX) 500 mg in sodium chloride 0.9 % 250 mL IVPB        500 mg 250 mL/hr over 60 Minutes Intravenous Every 24 hours 08/29/20 0940 09/01/20 1405   08/26/20 2300  cefTRIAXone (ROCEPHIN) 1 g in sodium chloride 0.9 % 100 mL IVPB  Status:  Discontinued        1 g 200 mL/hr over 30 Minutes Intravenous Every 24 hours 08/26/20 2209 08/30/20 0956   08/15/20 1600  ceFAZolin (ANCEF) IVPB 1 g/50 mL premix        1 g 100 mL/hr over 30 Minutes Intravenous Every 8 hours 08/15/20 1429 08/19/20 2243   08/14/20 1730  cefTRIAXone (ROCEPHIN) 1 g in sodium chloride 0.9 % 100 mL IVPB  Status:  Discontinued        1 g 200 mL/hr over 30 Minutes Intravenous Every 24 hours 08/14/20 1647 08/15/20 1429   08/12/20 1230  cephALEXin (KEFLEX) capsule 500 mg  Status:  Discontinued        500 mg Oral Every 12 hours  08/12/20 1139 08/14/20 1647       Subjective: Sitting up in chair.  Denies any nausea or emesis.  Tolerating regular diet.  Passing flatus.  Having some mushy stools.  No chest pain.  No shortness of breath.  Right hand pain and swelling improving.  Objective: Vitals:   09/01/20 2149 09/01/20 2200 09/02/20 0300 09/02/20 0800  BP: (!) 165/91 (!) 168/84 (!) 151/97 (!) 166/101  Pulse:  90 86 69  Resp:  15 18 13   Temp: 97.7 F (36.5 C)  98.1 F (36.7  C)   TempSrc: Oral  Oral   SpO2:  97% 97% 98%  Weight:      Height:        Intake/Output Summary (Last 24 hours) at 09/02/2020 1122 Last data filed at 09/02/2020 1015 Gross per 24 hour  Intake 2104.73 ml  Output 1950 ml  Net 154.73 ml   Filed Weights   08/21/20 0500 08/22/20 0509 08/28/20 1820  Weight: 108.4 kg 105.3 kg 103.3 kg    Examination:  General exam: NAD Respiratory system: CTA B.  No wheezes, no crackles, no rhonchi.  Normal respiratory effort.  Speaking in full sentences.  Cardiovascular system: RRR no murmurs rubs or gallops.  No JVD.  Trace bilateral lower extremity edema.  Gastrointestinal system: Abdomen is distended, soft, nontender to palpation, positive bowel sounds.  No rebound.  No guarding.  Central nervous system: Alert and oriented. No focal neurological deficits. Extremities: Right hand swelling decreased, decreased tenderness to palpation. Skin: No rashes, lesions or ulcers Psychiatry: Judgement and insight appear normal. Mood & affect appropriate.     Data Reviewed: I have personally reviewed following labs and imaging studies  CBC: Recent Labs  Lab 08/28/20 0400 08/28/20 0400 08/29/20 0425 08/30/20 0439 08/31/20 0439 09/01/20 0455 09/02/20 0440  WBC 7.7   < > 9.0 9.7 11.1* 15.9* 15.7*  NEUTROABS 4.7  --   --   --   --   --   --   HGB 7.7*   < > 7.6* 7.5* 7.5* 7.4* 8.0*  HCT 25.0*   < > 24.6* 23.8* 24.4* 23.8* 25.5*  MCV 79.6*   < > 78.6* 78.5* 79.5* 78.3* 78.5*  PLT 154   < > 148*  146* 143* 174 216   < > = values in this interval not displayed.    Basic Metabolic Panel: Recent Labs  Lab 08/28/20 0400 08/28/20 0400 08/29/20 0425 08/30/20 0439 08/31/20 0439 09/01/20 0455 09/02/20 0440  NA 134*   < > 132* 134* 135 140 142  K 4.3   < > 4.6 3.5 3.5 3.2* 3.4*  CL 104   < > 104 104 104 109 111  CO2 22   < > 21* 22 21* 23 23  GLUCOSE 197*   < > 294* 193* 398* 210* 168*  BUN 15   < > 15 15 20  22* 24*  CREATININE 0.70   < > 0.69 0.64 0.66 0.65 0.71  CALCIUM 8.6*   < > 8.3* 8.2* 8.6* 8.6* 8.6*  MG 1.8   < > 1.8 2.1 2.1 2.2 2.2  PHOS 3.5  --  3.6 3.9 3.2  --   --    < > = values in this interval not displayed.    GFR: Estimated Creatinine Clearance: 108.6 mL/min (by C-G formula based on SCr of 0.71 mg/dL).  Liver Function Tests: Recent Labs  Lab 08/28/20 0400 08/29/20 0425 08/30/20 0439 08/31/20 0439  AST 30 33 36 40  ALT 33 41 47* 56*  ALKPHOS 84 95 110 119  BILITOT 0.7 0.4 0.6 0.5  PROT 5.9* 6.0* 5.9* 6.3*  ALBUMIN 2.5* 2.5* 2.4* 2.3*    CBG: Recent Labs  Lab 09/01/20 1554 09/01/20 2244 09/02/20 0025 09/02/20 0347 09/02/20 0728  GLUCAP 334* 220* 230* 165* 128*     Recent Results (from the past 240 hour(s))  Culture, blood (routine x 2)     Status: None   Collection Time: 08/26/20  2:27 PM   Specimen: BLOOD  Result Value Ref  Range Status   Specimen Description   Final    BLOOD LEFT FOOT Performed at [facility] [address], [place], [place] [address]    Special Requests   Final    BOTTLES DRAWN AEROBIC AND ANAEROBIC Blood Culture adequate volume Performed at [facility] [address] [place], [place] [address]    Culture   Final    NO GROWTH 5 DAYS Performed at [facility], [place] [address], [place], [place] [address]    Report Status 08/31/2020 FINAL  Final  Culture, Urine     Status: Abnormal   Collection Time: 08/26/20  5:44 PM   Specimen: Urine, Clean Catch  Result Value  Ref Range Status   Specimen Description URINE, CLEAN CATCH  Final   Special Requests NONE  Final   Culture (A)  Final    >=100,000 COLONIES/mL ESCHERICHIA COLI >=100,000 COLONIES/mL PSEUDOMONAS AERUGINOSA    Report Status 08/31/2020 FINAL  Final   Organism ID, Bacteria ESCHERICHIA COLI (A)  Final   Organism ID, Bacteria PSEUDOMONAS AERUGINOSA (A)  Final      Susceptibility   Escherichia coli - MIC*    AMPICILLIN 4 SENSITIVE Sensitive     CEFAZOLIN <=4 SENSITIVE Sensitive     CEFTRIAXONE <=0.25 SENSITIVE Sensitive     CIPROFLOXACIN <=0.25 SENSITIVE Sensitive     GENTAMICIN <=1 SENSITIVE Sensitive     IMIPENEM <=0.25 SENSITIVE Sensitive     NITROFURANTOIN <=16 SENSITIVE Sensitive     TRIMETH/SULFA <=20 SENSITIVE Sensitive     AMPICILLIN/SULBACTAM <=2 SENSITIVE Sensitive     PIP/TAZO <=4 SENSITIVE Sensitive     * >=100,000 COLONIES/mL ESCHERICHIA COLI   Pseudomonas aeruginosa - MIC*    CEFTAZIDIME 4 SENSITIVE Sensitive     CIPROFLOXACIN <=0.25 SENSITIVE Sensitive     GENTAMICIN <=1 SENSITIVE Sensitive     IMIPENEM 1 SENSITIVE Sensitive     PIP/TAZO <=4 SENSITIVE Sensitive     CEFEPIME 2 SENSITIVE Sensitive     * >=100,000 COLONIES/mL PSEUDOMONAS AERUGINOSA  Culture, blood (Routine X 2) w Reflex to ID Panel     Status: None (Preliminary result)   Collection Time: 08/31/20 10:31 AM   Specimen: BLOOD  Result Value Ref Range Status   Specimen Description   Final    BLOOD LEFT FOOT Performed at [facility], [place] [address], [place], [place] [address]    Special Requests   Final    BOTTLES DRAWN AEROBIC AND ANAEROBIC Blood Culture adequate volume Performed at [facility] [address] [place], [place] [address]    Culture   Final    NO GROWTH 2 DAYS Performed at [facility] [address] [place], [place] [address]    Report Status PENDING  Incomplete  Culture, blood (Routine X 2) w Reflex to ID Panel     Status: None  (Preliminary result)   Collection Time: 08/31/20 10:46 AM   Specimen: BLOOD  Result Value Ref Range Status   Specimen Description   Final    BLOOD RIGHT FOOT Performed at [facility] [address] [place], [place] [address]    Special Requests   Final    BOTTLES DRAWN AEROBIC AND ANAEROBIC Blood Culture adequate volume Performed at [facility] [address], [place], [place] [address]    Culture   Final    NO GROWTH 2 DAYS Performed at [facility]  [address], [place], [place] [address]    Report Status PENDING  Incomplete         Radiology Studies: DG Abd 2 Views  Result Date: 09/01/2020 CLINICAL DATA:  Ileus. EXAM: ABDOMEN - 2 VIEW COMPARISON:  August 29, 2020. FINDINGS: Stable dilated large and small bowel loops are noted concerning for ileus. There is no evidence of free air. No radio-opaque calculi or other significant radiographic abnormality is seen. IMPRESSION: Stable dilated large and small bowel loops are noted concerning for ileus. Electronically Signed   By: Marijo Conception M.D.   On: 09/01/2020 12:31        Scheduled Meds: . (feeding supplement) PROSource Plus  30 mL Oral BID BM  . Chlorhexidine Gluconate Cloth  6 each Topical Daily  . feeding supplement (KATE [employer]ARMS STANDARD 1.4)  325 mL Oral Daily  . insulin aspart  0-20 Units Subcutaneous Q4H  . insulin glargine  7 Units Subcutaneous QHS  . methylPREDNISolone (SOLU-MEDROL) injection  40 mg Intravenous Daily  . metoCLOPramide (REGLAN) injection  5 mg Intravenous Q8H  . metoprolol tartrate  10 mg Intravenous Q6H  . pantoprazole (PROTONIX) IV  40 mg Intravenous Q24H  . polyethylene glycol  17 g Oral Daily  . sodium chloride flush  10-40 mL Intracatheter Q12H   Continuous Infusions: . sodium chloride 10 mL/hr at 09/02/20 0828  . ceFEPime (MAXIPIME) IV 2 g (09/02/20 1015)  . heparin 1,550 Units/hr (09/02/20 1116)  . potassium chloride 10 mEq  (09/02/20 1057)  . TPN ADULT (ION) 50 mL/hr at 09/01/20 1723  . TPN ADULT (ION)       LOS: 21 days    Time spent: 35 minutes    Irine Seal, MD [facility]   To contact the attending provider between 7A-7P or the covering provider during after hours 7P-7A, please log into the web site [email or web address] and access using universal [place] password for that web site. If you do not have the password, please call the hospital operator.  09/02/2020, 11:22 AM

## 2020-09-02 NOTE — Progress Notes (Signed)
    Progress Note rounding for Dr. Benson Norway   Subjective  Patient sitting up without complaint Tolerating regular diet No abdominal pain, nausea or vomiting Had a "large" bowel movement this morning and passing flatus multiple times throughout the morning   Objective  Vital signs in last 24 hours: Temp:  [97.7 F (36.5 C)-98.2 F (36.8 C)] 97.9 F (36.6 C) (10/16 1200) Pulse Rate:  [65-102] 72 (10/16 1200) Resp:  [13-18] 15 (10/16 1200) BP: (147-175)/(84-109) 171/100 (10/16 1200) SpO2:  [97 %-100 %] 98 % (10/16 1200) Last BM Date: 09/01/20  General: Alert, well-developed, in NAD Heart:  Regular rate and rhythm Chest: Clear to ascultation bilaterally Abdomen:  Soft, soft but distended with increased tympany, bowel sounds are present but hypoactive, without guarding, and without rebound.   Extremities: 3+ edema bilateral lower extremity Neurologic:  Alert and  oriented x4; grossly normal neurologically. Psych:  Alert and cooperative. Normal mood and affect.  Intake/Output from previous day: 10/15 0701 - 10/16 0700 In: 4369.4 [P.O.:890; I.V.:2719; IV Piggyback:760.3] Out: 1725 [Urine:1725] Intake/Output this shift: Total I/O In: -  Out: 400 [Urine:400]  Lab Results: Recent Labs    08/31/20 0439 09/01/20 0455 09/02/20 0440  WBC 11.1* 15.9* 15.7*  HGB 7.5* 7.4* 8.0*  HCT 24.4* 23.8* 25.5*  PLT 143* 174 216   BMET Recent Labs    08/31/20 0439 09/01/20 0455 09/02/20 0440  NA 135 140 142  K 3.5 3.2* 3.4*  CL 104 109 111  CO2 21* 23 23  GLUCOSE 398* 210* 168*  BUN 20 22* 24*  CREATININE 0.66 0.65 0.71  CALCIUM 8.6* 8.6* 8.6*   LFT Recent Labs    08/31/20 0439  PROT 6.3*  ALBUMIN 2.3*  AST 40  ALT 56*  ALKPHOS 119  BILITOT 0.5   PT/INR No results for input(s): LABPROT, INR in the last 72 hours. Hepatitis Panel No results for input(s): HEPBSAG, HCVAB, HEPAIGM, HEPBIGM in the last 72 hours.  Studies/Results: DG Abd 2 Views  Result Date:  09/01/2020 CLINICAL DATA:  Ileus. EXAM: ABDOMEN - 2 VIEW COMPARISON:  August 29, 2020. FINDINGS: Stable dilated large and small bowel loops are noted concerning for ileus. There is no evidence of free air. No radio-opaque calculi or other significant radiographic abnormality is seen. IMPRESSION: Stable dilated large and small bowel loops are noted concerning for ileus. Electronically Signed   By: Marijo Conception M.D.   On: 09/01/2020 12:31      Assessment & Recommendations  59 year old with diabetes, gout, hospitalization for AKI for nearly 2 weeks in August found to have upper extremity DVT, now admitted with sepsis and ileus.  1.  Prolonged ileus --small and large bowel.  Abdominal exam is benign though bowel remains distended by x-ray.  He is tolerating regular diet and having bowel movements and passing flatus. --Continue regular diet, if tolerating and moving bowels without pain or evidence of nausea vomiting can likely transition off TPN --Upper endoscopy and colonoscopy warranted, see #2  2.  Anemia --stable but previously heme positive.  Patient without overt bleeding.  Very likely unable to tolerate prepping for colonoscopy at this point given prolonged ileus.  Dr. Benson Norway can decide on timing of upper endoscopy and colonoscopy, which may be as an outpatient        LOS: 21 days   Jerene Bears  09/02/2020, 1:36 PM

## 2020-09-02 NOTE — Progress Notes (Signed)
[facility] for IV heparin (on apixaban PTA) Indication: history of DVT  Allergies  Allergen Reactions  . Hctz [Hydrochlorothiazide]     Hx of gout; uric acid 14.1 on 03/13/20 Acute gout L elbow 8/11-8/24/21    Patient Measurements: Height: 5\' 5"  (165.1 cm) Weight: 103.3 kg (227 lb 11.8 oz) IBW/kg (Calculated) : 61.5 Heparin Dosing Weight: 85.7 kg  Vital Signs: Temp: 97.5 F (36.4 C) (10/16 1438) Temp Source: Oral (10/16 1200) BP: 153/85 (10/16 2000) Pulse Rate: 75 (10/16 2000)  Labs: Recent Labs    08/31/20 0439 08/31/20 0439 09/01/20 0455 09/01/20 0455 09/02/20 0440 09/02/20 0811 09/02/20 1907  HGB 7.5*   < > 7.4*  --  8.0*  --   --   HCT 24.4*  --  23.8*  --  25.5*  --   --   PLT 143*  --  174  --  216  --   --   HEPARINUNFRC 0.47   < > 0.63   < > 0.92* 1.01* 0.98*  CREATININE 0.66  --  0.65  --  0.71  --   --    < > = values in this interval not displayed.    Estimated Creatinine Clearance: 108.6 mL/min (by C-G formula based on SCr of 0.71 mg/dL).  Assessment: 49 y/oM with PMH of DVT (07/03/2020) on Apixaban PTA admitted on 08/10/2020 with SIRS. PO meds held d/t colonic ileus. Pharmacy consulted for IV heparin dosing while Apixaban on hold.   09/02/20 9:52 PM   Heparin level 0.98 despite rate reduction- confirmed foot draw  CBC: Hgb low but slightly improved 8.0; Pltc WNL  No bleeding or infusion issues noted  Goal of Therapy:  Heparin level 0.3-0.7 units/ml Monitor platelets by anticoagulation protocol: Yes   Plan:   Decrease infusion to 1350 units/hr  Recheck heparin lever peripherally in 6hrs  Daily heparin level and CBC  Monitor closely for signs and symptoms of bleeding  F/u ability to resume [place], PharmD, BCPS [phone] 09/02/2020, 9:52 PM

## 2020-09-03 DIAGNOSIS — I82622 Acute embolism and thrombosis of deep veins of left upper extremity: Secondary | ICD-10-CM | POA: Diagnosis not present

## 2020-09-03 DIAGNOSIS — E872 Acidosis: Secondary | ICD-10-CM | POA: Diagnosis not present

## 2020-09-03 DIAGNOSIS — N182 Chronic kidney disease, stage 2 (mild): Secondary | ICD-10-CM | POA: Diagnosis not present

## 2020-09-03 DIAGNOSIS — K567 Ileus, unspecified: Secondary | ICD-10-CM | POA: Diagnosis not present

## 2020-09-03 LAB — GLUCOSE, CAPILLARY
Glucose-Capillary: 117 mg/dL — ABNORMAL HIGH (ref 70–99)
Glucose-Capillary: 133 mg/dL — ABNORMAL HIGH (ref 70–99)
Glucose-Capillary: 158 mg/dL — ABNORMAL HIGH (ref 70–99)
Glucose-Capillary: 197 mg/dL — ABNORMAL HIGH (ref 70–99)
Glucose-Capillary: 205 mg/dL — ABNORMAL HIGH (ref 70–99)
Glucose-Capillary: 256 mg/dL — ABNORMAL HIGH (ref 70–99)

## 2020-09-03 LAB — BASIC METABOLIC PANEL
Anion gap: 8 (ref 5–15)
BUN: 24 mg/dL — ABNORMAL HIGH (ref 6–20)
CO2: 26 mmol/L (ref 22–32)
Calcium: 8.3 mg/dL — ABNORMAL LOW (ref 8.9–10.3)
Chloride: 105 mmol/L (ref 98–111)
Creatinine, Ser: 0.65 mg/dL (ref 0.61–1.24)
GFR, Estimated: >60 mL/min (ref >60)
Glucose, Bld: 117 mg/dL — ABNORMAL HIGH (ref 70–99)
Potassium: 4 mmol/L (ref 3.5–5.1)
Sodium: 139 mmol/L (ref 135–145)

## 2020-09-03 LAB — CBC
HCT: 26.2 % — ABNORMAL LOW (ref 39.0–52.0)
Hemoglobin: 8.4 g/dL — ABNORMAL LOW (ref 13.0–17.0)
MCH: 25.1 pg — ABNORMAL LOW (ref 26.0–34.0)
MCHC: 32.1 g/dL (ref 30.0–36.0)
MCV: 78.4 fL — ABNORMAL LOW (ref 80.0–100.0)
Platelets: 251 10*3/uL (ref 150–400)
RBC: 3.34 MIL/uL — ABNORMAL LOW (ref 4.22–5.81)
RDW: 22.4 % — ABNORMAL HIGH (ref 11.5–15.5)
WBC: 15.4 10*3/uL — ABNORMAL HIGH (ref 4.0–10.5)
nRBC: 2.1 % — ABNORMAL HIGH (ref 0.0–0.2)

## 2020-09-03 LAB — MAGNESIUM: Magnesium: 2 mg/dL (ref 1.7–2.4)

## 2020-09-03 LAB — HEPARIN LEVEL (UNFRACTIONATED): Heparin Unfractionated: 0.81 IU/mL — ABNORMAL HIGH (ref 0.30–0.70)

## 2020-09-03 MED ORDER — INSULIN ASPART 100 UNIT/ML ~~LOC~~ SOLN
0.0000 [IU] | Freq: Every day | SUBCUTANEOUS | Status: DC
  Administered 2020-09-03: 3 [IU] via SUBCUTANEOUS

## 2020-09-03 MED ORDER — INSULIN ASPART 100 UNIT/ML ~~LOC~~ SOLN
0.0000 [IU] | Freq: Three times a day (TID) | SUBCUTANEOUS | Status: AC
  Administered 2020-09-03 – 2020-09-05 (×5): 4 [IU] via SUBCUTANEOUS
  Administered 2020-09-05: 3 [IU] via SUBCUTANEOUS
  Administered 2020-09-05: 4 [IU] via SUBCUTANEOUS

## 2020-09-03 MED ORDER — ENOXAPARIN SODIUM 100 MG/ML ~~LOC~~ SOLN
100.0000 mg | Freq: Two times a day (BID) | SUBCUTANEOUS | Status: DC
  Administered 2020-09-03 – 2020-09-08 (×11): 100 mg via SUBCUTANEOUS
  Filled 2020-09-03 (×14): qty 1

## 2020-09-03 MED ORDER — TRAVASOL 10 % IV SOLN
INTRAVENOUS | Status: AC
  Filled 2020-09-03: qty 600

## 2020-09-03 MED ORDER — FUROSEMIDE 10 MG/ML IJ SOLN
20.0000 mg | Freq: Once | INTRAMUSCULAR | Status: AC
  Administered 2020-09-03: 20 mg via INTRAVENOUS
  Filled 2020-09-03: qty 2

## 2020-09-03 NOTE — Progress Notes (Signed)
[place] for IV Heparin >> SQ Lovenox (on apixaban PTA) Indication: history of DVT  Allergies  Allergen Reactions  . Hctz [Hydrochlorothiazide]     Hx of gout; uric acid 14.1 on 03/13/20 Acute gout L elbow 8/11-8/24/21    Patient Measurements: Height: 5\' 5"  (165.1 cm) Weight: 103.3 kg (227 lb 11.8 oz) IBW/kg (Calculated) : 61.5 Heparin Dosing Weight: 85.7 kg  Vital Signs: Temp: 97.7 F (36.5 C) (10/17 0322) Temp Source: Oral (10/17 0322) BP: 157/101 (10/17 0700) Pulse Rate: 60 (10/17 0700)  Labs: Recent Labs    09/01/20 0455 09/01/20 0455 09/02/20 0440 09/02/20 0440 09/02/20 0811 09/02/20 1907 09/03/20 0400  HGB 7.4*   < > 8.0*  --   --   --  8.4*  HCT 23.8*  --  25.5*  --   --   --  26.2*  PLT 174  --  216  --   --   --  251  HEPARINUNFRC 0.63   < > 0.92*   < > 1.01* 0.98* 0.81*  CREATININE 0.65  --  0.71  --   --   --  0.65   < > = values in this interval not displayed.    Estimated Creatinine Clearance: 108.6 mL/min (by C-G formula based on SCr of 0.65 mg/dL).  Assessment: 43 y/oM with PMH of DVT (07/03/2020) on Apixaban PTA admitted on 08/10/2020 with SIRS. PO meds held d/t colonic ileus. Pharmacy consulted for IV heparin dosing while Apixaban on hold.   09/03/20 11:24 AM   Heparin level 0.81- SUPRAtherapeutic  CBC: Hgb low but slightly improved 8.4; Pltc WNL  No bleeding or infusion issues per discussion with RN  Transition to SQ lovenox today since having to obtain heparin levels from feet.  Goal of Therapy:  Anti-Xa level 0.6-1.0 4hrs after dose Monitor platelets by anticoagulation protocol: Yes   Plan:   Stop IV heparin then begin Lovenox 1mg /kg SQ q12h  Monitor renal function and CBC  Monitor closely for signs and symptoms of bleeding  F/u ability to resume PO Eliquis  Peggyann Juba, PharmD, BCPS Pharmacy: [phone] 09/03/2020, 11:24 AM

## 2020-09-03 NOTE — Progress Notes (Signed)
    Progress Note, rounding for Dr. Benson Norway   Subjective  Continues to feel well and tolerate regular diet No abdominal pain Bowel movement today and flatus No nausea or vomiting TPN decreased approximately 50%   Objective  Vital signs in last 24 hours: Temp:  [97.4 F (36.3 C)-98.1 F (36.7 C)] 97.7 F (36.5 C) (10/17 0322) Pulse Rate:  [60-96] 96 (10/17 1100) Resp:  [10-16] 12 (10/17 1100) BP: (144-180)/(85-132) 179/98 (10/17 1100) SpO2:  [96 %-100 %] 97 % (10/17 1100) Last BM Date: 09/02/20 Gen: awake, alert, NAD HEENT: anicteric, op clear CV: RRR, no mrg Pulm: CTA b/l Abd: soft, distended with increased tympany, not tender, positive bowel sounds Ext: 2+ lower extremity edema Neuro: nonfocal   Intake/Output from previous day: 10/16 0701 - 10/17 0700 In: 1744.2 [P.O.:200; I.V.:857.7; IV Piggyback:686.5] Out: 2150 [Urine:2150] Intake/Output this shift: Total I/O In: 993.8 [P.O.:240; I.V.:553.8; IV Piggyback:200] Out: 500 [Urine:500]  Lab Results: Recent Labs    09/01/20 0455 09/02/20 0440 09/03/20 0400  WBC 15.9* 15.7* 15.4*  HGB 7.4* 8.0* 8.4*  HCT 23.8* 25.5* 26.2*  PLT 174 216 251   BMET Recent Labs    09/01/20 0455 09/02/20 0440 09/03/20 0400  NA 140 142 139  K 3.2* 3.4* 4.0  CL 109 111 105  CO2 23 23 26   GLUCOSE 210* 168* 117*  BUN 22* 24* 24*  CREATININE 0.65 0.71 0.65  CALCIUM 8.6* 8.6* 8.3*     Assessment & Recommendations  59 year old with diabetes, gout, hospitalization for AKI for nearly 2 weeks in August found to have upper extremity DVT, now admitted with sepsis and ileus.  1.  Prolonged ileus --small and large bowel ileus.  Exam continues to be benign and he is now tolerating regular diet, having bowel movements and passing gas --Continue regular diet --Continue to wean TPN --Dr. Benson Norway to decide timing of upper endoscopy and colonoscopy, which may be as an outpatient.    2. Anemia --stable, previously heme positive.  Upper  endoscopy and colonoscopy timing per Dr. Benson Norway  Discussed with Dr. Grandville Silos today  Dr. Benson Norway or Dr. Collene Mares will resume care tomorrow    LOS: 22 days   Jerene Bears  09/03/2020, 3:04 PM

## 2020-09-03 NOTE — Progress Notes (Signed)
PROGRESS NOTE    [name]  ZOX:[id number] DOB: 1961/02/22 DOA: 08/10/2020 PCP: Charlott Rakes, MD    Chief Complaint  Patient presents with  . Leg Swelling  . Medical Clearance    rehab placement     Brief Narrative:  [name] is a 59 yo male with past medical history significant of type 2 diabetes, chronic gout who was recently hospitalized 8/11-8/24 with AKI, at that time found to have left upper extremity DVT in addition to polyarticular gout.  He now comes in with sepsis, UTI, bowel obstruction. He has been on IV heparin due to LUE DVT as well as TPN due to ileus.     Assessment & Plan:   Principal Problem:   Ileus ([place]) Active Problems:   Essential hypertension   Gout   Iron deficiency anemia   Elevated troponin   Generalized weakness   Uncontrolled type 2 diabetes mellitus with hyperglycemia, without long-term current use of insulin (HCC)   Acute deep vein thrombosis (DVT) of brachial vein of left upper extremity (HCC)   Hypokalemia   CKD (chronic kidney disease) stage 2, GFR 60-89 ml/min   Polyarticular gout   Nasogastric tube present   Acidosis, metabolic   Sepsis due to Escherichia coli without acute organ dysfunction ([place])  1 colonic and small bowel 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  Tolerating full liquid diet at this time.  Patient stated had a large bowel movement on 08/15/2020, passing flatus.  Patient stated had a large bowel movement (08/18/2020) however per RN patient with mucus.  Patient also with some nausea and emesis on 08/18/2020. Per RN patient has not had any significant bowel movement since 08/15/2020. Patient stated ambulated in hallways on 08/15/2020 with therapy. Abdominal films done with stable diffuse colonic dilatation is noted most  consistent with colonic ileus/adynamic ileus with progression and colonic pneumatosis..  CT abdomen and pelvis done 08/14/2020 with findings consistent with colonic ileus, pneumatosis of the ascending and hepatic flexure of the colon.  No associated colonic wall thickening or portal venous gas.  Normal small bowel without obstruction.  Hepatic steatosis.  Bilateral renal parenchymal atrophy with lobular contours.  Mild bladder wall thickening.  Patient with no significant change with abdominal distention.  Abdominal films with persistent gaseous distention of large and small bowel loops.  Patient seen by GI and NG tube ordered however unable to be placed per RN.  NG tube placed by GI under guidewire.  Per RN after NG tube placed patient with some relief of abdominal discomfort.  General surgery was following and initially recommended mobilization and GI decompression.  General surgery ordered neostigmine x 1 and per RN patient noted to have watery loose stools after neostigmine was given.  Rectal tube has been ordered per GI.  Repeat abdominal films with unchanged gaseous distention of small bowel and colon compatible with ileus.    NG tube has subsequently been discontinued.  PICC line placed and patient was started on TPN.    General surgery was following but signed off 08/22/2020.  Patient with TPN and once tolerating oral intake will start to wean down TPN.  Patient was placed on a full liquid diet per Dr. Benson Norway who had recommended advancement to a soft diet however patient noted to have some abdominal distention and nausea when placed on a soft diet and was placed  back on a full liquid diet.  Diet advanced to a soft diet 08/31/2020 which patient seems to be tolerating.  Diet advanced to regular diet 09/01/2020 which patient tolerated.  Per RN patient with about 50% of meals.  Patient having some bowel movements and passing gas.  TPN currently at half the full goal rate.  Slowly improving clinically.  Keep  potassium > 4, magnesium > 2.  Supportive care.  Per GI.  2.  Polyarticular gout with osteoarthritis/acute gouty arthritis It is noted that patient had previous work-up with rheumatoid factor and ANA which were negative.  Steroids held on admission.  Allopurinol discontinued on admission.  Colchicine on hold.  Per RN patient with complaints of exquisite right hand pain concerning for an acute gouty flare on 08/30/2020.  Patient placed on IV Solu-Medrol with clinical improvement.  Outpatient follow-up with rheumatology.  3.  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  Patient noted to have been on 70/30 insulin 50 units twice daily.  CBG at 117 this morning.  Increase blood glucose levels likely due to initiation of steroids due to concern for acute gouty flare.  Patient on TPN and insulin being adjusted per pharmacy.  Follow.  4.  Hypokalemia/hypomagnesemia Potassium at 4.0.  Magnesium at 2.0.  Patient on TPN.  Electrolytes per pharmacy.    5.  Left upper extremity DVT Was on apixaban twice daily.    Patient's diet is slowly being advanced.  Currently on IV heparin for anticoagulation.  Transition to full dose Lovenox.  Once patient tolerating diet consistently could likely resume him back on home regimen of apixaban hopefully in the next 24 to 48 hours.  Follow.  6.  Iron deficiency anemia Status post IV Feraheme 08/12/2020.    Hemoglobin stable at 8.4.  Likely dilutional effect.  Will need to resume oral iron supplementation on discharge.  Transfusion threshold hemoglobin < 7.  7.  Sepsis secondary to E. coli UTI, Pseudomonas aeruginosa, POA Patient noted to have urine cultures are growing 100,000 colonies of E. coli was initially on Keflex and subsequently transitioned to IV Ancef as patient noted to be n.p.o.  Patient finished a full course of antibiotics on 08/19/2020.  Patient however was having fevers on 08/26/2020 and repeat urinalysis concerning  for UTI.  Patient started on IV Rocephin 08/26/2020, blood cultures done 08/26/2020 with no growth to date.  Urine cultures were positive for E. coli as well as Pseudomonas aeruginosa.  Patient noted to have a fever again on 08/29/2020 with tachycardia and due to concerns for possible pneumonia azithromycin added to regimen of IV Rocephin.  Cultures did show Pseudomonas and as such IV Rocephin discontinued and patient started on IV cefepime 08/30/2020.  Patient afebrile x 72 hours.  Repeat chest x-ray done negative for any acute infiltrate.  Azithromycin discontinued.  Continue empiric IV cefepime and treat for 5-7 days total.  Follow.    8.  Fevers Patient noted to have had fevers for the past few days.  Patient initially with fever on 08/26/2020 with repeat urinalysis concerning for UTI.  Patient placed back on IV Rocephin 08/26/2020 and pancultured.  Urine cultures positive for E. coli as well as Pseudomonas and as such antibiotics were broadened to IV cefepime as patient noted to have fevers again on 08/29/2020.  Patient with fevers 08/30/2020.  Currently afebrile x72 hours.  Azithromycin discontinued.  Patient on steroids for acute gouty arthritis.    9.  Prior bilateral knee surgeries PT/OT.  SNF  placement.  10.  Chronic kidney disease stage II Stable.     11.  Metabolic acidosis Patient noted to be acidotic.  Concern for abdominal ischemia as patient with significant colonic ileus with no significant improvement.  Lactic acid level within normal limits.  Acidosis resolved with bicarb drip.  Bicarb drip has been discontinued.  Follow.  12.  Nutrition Due to patient's colonic distention/ileus patient had been n.p.o. for several days early on in the hospitalization.    Status post NG tube and rectal tube placement.  PICC line placed and patient was started on TPN per pharmacy.  Patient currently on a regular diet which he is tolerating however eating approximately 50% of meals per RN.  Continue  to wean TPN as per GI recommendations.  Follow.   13.  Severe onchogryphosis  14.  QTC prolongation Per RN patient with QTC prolongation of 550.  Repeat EKG 08/22/2020 with resolution of QTC prolongation.  Keep potassium > 4, magnesium > 2.  15.  Hypertension IV metoprolol increased to 10 mg every 6 hours.  Norvasc added 5 mg daily and could further uptitrate for better blood pressure control.     DVT prophylaxis: Heparin>>> Lovenox Code Status: Full Family Communication: Updated patient.  No family at bedside.  Disposition:   Status is: Inpatient    Dispo:  Patient From:    Planned Disposition: [facility]  Expected discharge date: 09/04/20  Medically stable for discharge:  No        Consultants:   Gastroenterology: Dr. Collene Mares 08/14/2020  General surgery: Dr. Hassell Done 08/14/2020  Procedures:   CT angiogram chest 08/10/2020  CT abdomen and pelvis 08/14/2020  2D echo 08/11/2020  Abdominal films 08/18/2020, 08/17/2020, 08/16/2020, 08/14/2020  PICC line placement  Antimicrobials:  Anti-infectives (From admission, onward)   Start     Dose/Rate Route Frequency Ordered Stop   08/30/20 1100  ceFEPIme (MAXIPIME) 2 g in sodium chloride 0.9 % 100 mL IVPB        2 g 200 mL/hr over 30 Minutes Intravenous Every 12 hours 08/30/20 1003     08/29/20 1030  azithromycin (ZITHROMAX) 500 mg in sodium chloride 0.9 % 250 mL IVPB        500 mg 250 mL/hr over 60 Minutes Intravenous Every 24 hours 08/29/20 0940 09/01/20 1405   08/26/20 2300  cefTRIAXone (ROCEPHIN) 1 g in sodium chloride 0.9 % 100 mL IVPB  Status:  Discontinued        1 g 200 mL/hr over 30 Minutes Intravenous Every 24 hours 08/26/20 2209 08/30/20 0956   08/15/20 1600  ceFAZolin (ANCEF) IVPB 1 g/50 mL premix        1 g 100 mL/hr over 30 Minutes Intravenous Every 8 hours 08/15/20 1429 08/19/20 2243   08/14/20 1730  cefTRIAXone (ROCEPHIN) 1 g in sodium chloride 0.9 % 100 mL IVPB  Status:  Discontinued         1 g 200 mL/hr over 30 Minutes Intravenous Every 24 hours 08/14/20 1647 08/15/20 1429   08/12/20 1230  cephALEXin (KEFLEX) capsule 500 mg  Status:  Discontinued        500 mg Oral Every 12 hours 08/12/20 1139 08/14/20 1647       Subjective: Patient sitting up in chair.  Denies any nausea or emesis.  Tolerating current diet however per RN eating about 50% of his meal.  Passing flatus.  Having mushy stools.  No chest pain.  No shortness of breath.  Improved right  hand pain and swelling.  Asking when he is going to be leaving the hospital.    Objective: Vitals:   09/02/20 2000 09/02/20 2300 09/03/20 0322 09/03/20 0700  BP: (!) 153/85 (!) 154/90 (!) 144/96 (!) 157/101  Pulse: 75 78 83 60  Resp: 15 13 14 10   Temp: 98.1 F (36.7 C) (!) 97.4 F (36.3 C) 97.7 F (36.5 C)   TempSrc: Oral Oral Oral   SpO2: 96% 98% 96% 97%  Weight:      Height:        Intake/Output Summary (Last 24 hours) at 09/03/2020 1115 Last data filed at 09/03/2020 1100 Gross per 24 hour  Intake 1984.15 ml  Output 1750 ml  Net 234.15 ml   Filed Weights   08/21/20 0500 08/22/20 0509 08/28/20 1820  Weight: 108.4 kg 105.3 kg 103.3 kg    Examination:  General exam: NAD Respiratory system: Lungs clear to auscultation bilaterally.  No wheezes, no crackles, no rhonchi.  Normal respiratory effort.  Speaking in full sentences.  Cardiovascular system: Regular rate rhythm no murmurs rubs or gallops.  No JVD.  Trace bilateral lower extremity edema. Gastrointestinal system: Abdomen is distended, soft, nontender to palpation, positive bowel sounds.  No rebound.  No guarding.  Central nervous system: Alert and oriented. No focal neurological deficits. Extremities: Right hand swelling decreased, decreased tenderness to palpation. Skin: No rashes, lesions or ulcers Psychiatry: Judgement and insight appear normal. Mood & affect appropriate.     Data Reviewed: I have personally reviewed following labs and imaging  studies  CBC: Recent Labs  Lab 08/28/20 0400 08/29/20 0425 08/30/20 0439 08/31/20 0439 09/01/20 0455 09/02/20 0440 09/03/20 0400  WBC 7.7   < > 9.7 11.1* 15.9* 15.7* 15.4*  NEUTROABS 4.7  --   --   --   --   --   --   HGB 7.7*   < > 7.5* 7.5* 7.4* 8.0* 8.4*  HCT 25.0*   < > 23.8* 24.4* 23.8* 25.5* 26.2*  MCV 79.6*   < > 78.5* 79.5* 78.3* 78.5* 78.4*  PLT 154   < > 146* 143* 174 216 251   < > = values in this interval not displayed.    Basic Metabolic Panel: Recent Labs  Lab 08/28/20 0400 08/28/20 0400 08/29/20 0425 08/29/20 0425 08/30/20 0439 08/31/20 0439 09/01/20 0455 09/02/20 0440 09/03/20 0400  NA 134*   < > 132*   < > 134* 135 140 142 139  K 4.3   < > 4.6   < > 3.5 3.5 3.2* 3.4* 4.0  CL 104   < > 104   < > 104 104 109 111 105  CO2 22   < > 21*   < > 22 21* 23 23 26   GLUCOSE 197*   < > 294*   < > 193* 398* 210* 168* 117*  BUN 15   < > 15   < > 15 20 22* 24* 24*  CREATININE 0.70   < > 0.69   < > 0.64 0.66 0.65 0.71 0.65  CALCIUM 8.6*   < > 8.3*   < > 8.2* 8.6* 8.6* 8.6* 8.3*  MG 1.8   < > 1.8   < > 2.1 2.1 2.2 2.2 2.0  PHOS 3.5  --  3.6  --  3.9 3.2  --   --   --    < > = values in this interval not displayed.    GFR: Estimated Creatinine Clearance: 108.6 mL/min (  by C-G formula based on SCr of 0.65 mg/dL).  Liver Function Tests: Recent Labs  Lab 08/28/20 0400 08/29/20 0425 08/30/20 0439 08/31/20 0439  AST 30 33 36 40  ALT 33 41 47* 56*  ALKPHOS 84 95 110 119  BILITOT 0.7 0.4 0.6 0.5  PROT 5.9* 6.0* 5.9* 6.3*  ALBUMIN 2.5* 2.5* 2.4* 2.3*    CBG: Recent Labs  Lab 09/02/20 1606 09/02/20 2029 09/02/20 2359 09/03/20 0317 09/03/20 0728  GLUCAP 172* 204* 205* 117* 133*     Recent Results (from the past 240 hour(s))  Culture, blood (routine x 2)     Status: None   Collection Time: 08/26/20  2:27 PM   Specimen: BLOOD  Result Value Ref Range Status   Specimen Description   Final    BLOOD LEFT FOOT Performed at [facility],  [place] [address], [place], [place] [address]    Special Requests   Final    BOTTLES DRAWN AEROBIC AND ANAEROBIC Blood Culture adequate volume Performed at [facility] [address], [place], [place] [address]    Culture   Final    NO GROWTH 5 DAYS Performed at [facility], [place] [address], [place], [place] [address]    Report Status 08/31/2020 FINAL  Final  Culture, Urine     Status: Abnormal   Collection Time: 08/26/20  5:44 PM   Specimen: Urine, Clean Catch  Result Value Ref Range Status   Specimen Description URINE, CLEAN CATCH  Final   Special Requests NONE  Final   Culture (A)  Final    >=100,000 COLONIES/mL ESCHERICHIA COLI >=100,000 COLONIES/mL PSEUDOMONAS AERUGINOSA    Report Status 08/31/2020 FINAL  Final   Organism ID, Bacteria ESCHERICHIA COLI (A)  Final   Organism ID, Bacteria PSEUDOMONAS AERUGINOSA (A)  Final      Susceptibility   Escherichia coli - MIC*    AMPICILLIN 4 SENSITIVE Sensitive     CEFAZOLIN <=4 SENSITIVE Sensitive     CEFTRIAXONE <=0.25 SENSITIVE Sensitive     CIPROFLOXACIN <=0.25 SENSITIVE Sensitive     GENTAMICIN <=1 SENSITIVE Sensitive     IMIPENEM <=0.25 SENSITIVE Sensitive     NITROFURANTOIN <=16 SENSITIVE Sensitive     TRIMETH/SULFA <=20 SENSITIVE Sensitive     AMPICILLIN/SULBACTAM <=2 SENSITIVE Sensitive     PIP/TAZO <=4 SENSITIVE Sensitive     * >=100,000 COLONIES/mL ESCHERICHIA COLI   Pseudomonas aeruginosa - MIC*    CEFTAZIDIME 4 SENSITIVE Sensitive     CIPROFLOXACIN <=0.25 SENSITIVE Sensitive     GENTAMICIN <=1 SENSITIVE Sensitive     IMIPENEM 1 SENSITIVE Sensitive     PIP/TAZO <=4 SENSITIVE Sensitive     CEFEPIME 2 SENSITIVE Sensitive     * >=100,000 COLONIES/mL PSEUDOMONAS AERUGINOSA  Culture, blood (Routine X 2) w Reflex to ID Panel     Status: None (Preliminary result)   Collection Time: 08/31/20 10:31 AM   Specimen: BLOOD  Result Value Ref Range Status   Specimen Description   Final    BLOOD LEFT  FOOT Performed at [facility], [place] [address], [place], [place] [address]    Special Requests   Final    BOTTLES DRAWN AEROBIC AND ANAEROBIC Blood Culture adequate volume Performed at [facility] [address] [place], [place] [address]    Culture   Final    NO GROWTH 3 DAYS Performed at [facility], [place] [address], [place], [place] [address]  Report Status PENDING  Incomplete  Culture, blood (Routine X 2) w Reflex to ID Panel     Status: None (Preliminary result)   Collection Time: 08/31/20 10:46 AM   Specimen: BLOOD  Result Value Ref Range Status   Specimen Description   Final    BLOOD RIGHT FOOT Performed at [facility] [address] [place], [place] [address]    Special Requests   Final    BOTTLES DRAWN AEROBIC AND ANAEROBIC Blood Culture adequate volume Performed at [facility] [address], [place], [place] [address]    Culture   Final    NO GROWTH 3 DAYS Performed at [facility], [place] [address], [place], [place] [address]    Report Status PENDING  Incomplete         Radiology Studies: DG Abd 2 Views  Result Date: 09/01/2020 CLINICAL DATA:  Ileus. EXAM: ABDOMEN - 2 VIEW COMPARISON:  August 29, 2020. FINDINGS: Stable dilated large and small bowel loops are noted concerning for ileus. There is no evidence of free air. No radio-opaque calculi or other significant radiographic abnormality is seen. IMPRESSION: Stable dilated large and small bowel loops are noted concerning for ileus. Electronically Signed   By: Marijo Conception M.D.   On: 09/01/2020 12:31        Scheduled Meds: . (feeding supplement) PROSource Plus  30 mL Oral BID BM  . amLODipine  5 mg Oral Daily  . Chlorhexidine Gluconate Cloth  6 each Topical Daily  . feeding supplement (KATE [employer]ARMS STANDARD 1.4)  325 mL Oral Daily  . insulin aspart  0-20 Units Subcutaneous TID WC  . insulin aspart  0-5 Units  Subcutaneous QHS  . insulin glargine  7 Units Subcutaneous QHS  . metoCLOPramide (REGLAN) injection  5 mg Intravenous Q8H  . metoprolol tartrate  10 mg Intravenous Q6H  . pantoprazole (PROTONIX) IV  40 mg Intravenous Q24H  . polyethylene glycol  17 g Oral Daily  . sodium chloride flush  10-40 mL Intracatheter Q12H   Continuous Infusions: . sodium chloride 10 mL/hr at 09/03/20 0507  . ceFEPime (MAXIPIME) IV 2 g (09/03/20 0932)  . heparin 1,150 Units/hr (09/03/20 0503)  . TPN ADULT (ION) 50 mL/hr at 09/02/20 1723  . TPN ADULT (ION)       LOS: 22 days    Time spent: 35 minutes    Irine Seal, MD [facility]   To contact the attending provider between 7A-7P or the covering provider during after hours 7P-7A, please log into the web site [email or web address] and access using universal [place] password for that web site. If you do not have the password, please call the hospital operator.  09/03/2020, 11:15 AM

## 2020-09-03 NOTE — Progress Notes (Signed)
[place] for IV heparin (on apixaban PTA) Indication: history of DVT  Allergies  Allergen Reactions  . Hctz [Hydrochlorothiazide]     Hx of gout; uric acid 14.1 on 03/13/20 Acute gout L elbow 8/11-8/24/21    Patient Measurements: Height: 5\' 5"  (165.1 cm) Weight: 103.3 kg (227 lb 11.8 oz) IBW/kg (Calculated) : 61.5 Heparin Dosing Weight: 85.7 kg  Vital Signs: Temp: 97.7 F (36.5 C) (10/17 0322) Temp Source: Oral (10/17 0322) BP: 144/96 (10/17 0322) Pulse Rate: 83 (10/17 0322)  Labs: Recent Labs    09/01/20 0455 09/01/20 0455 09/02/20 0440 09/02/20 0440 09/02/20 0811 09/02/20 1907 09/03/20 0400  HGB 7.4*  --  8.0*  --   --   --   --   HCT 23.8*  --  25.5*  --   --   --   --   PLT 174  --  216  --   --   --   --   HEPARINUNFRC 0.63   < > 0.92*   < > 1.01* 0.98* 0.81*  CREATININE 0.65  --  0.71  --   --   --  0.65   < > = values in this interval not displayed.    Estimated Creatinine Clearance: 108.6 mL/min (by C-G formula based on SCr of 0.65 mg/dL).  Assessment: 4 y/oM with PMH of DVT (07/03/2020) on Apixaban PTA admitted on 08/10/2020 with SIRS. PO meds held d/t colonic ileus. Pharmacy consulted for IV heparin dosing while Apixaban on hold.   09/03/20 4:55 AM   Heparin level 0.81- SUPRAtherapeutic  CBC: Hgb low but slightly improved 8.4; Pltc WNL  No bleeding or infusion issues noted  Goal of Therapy:  Heparin level 0.3-0.7 units/ml Monitor platelets by anticoagulation protocol: Yes   Plan:   Decrease infusion to 1150 units/hr  Recheck heparin lever peripherally in 6hrs  Daily heparin level and CBC  Monitor closely for signs and symptoms of bleeding  F/u ability to resume PO Eliquis  Netta Cedars, PharmD, BCPS 09/03/2020, 4:55 AM

## 2020-09-03 NOTE — Progress Notes (Signed)
PHARMACY - TOTAL PARENTERAL NUTRITION CONSULT NOTE   Indication: Prolonged ileus  Patient Measurements: Height: 5\' 5"  (165.1 cm) Weight: 103.3 kg (227 lb 11.8 oz) IBW/kg (Calculated) : 61.5 TPN AdjBW (KG): 72.7 Body mass index is 37.9 kg/m.  Assessment:  59 yo male with PMH gout, OA, DM2, anemia admitted with upper extremity DVT and development of colonic ileus. GI following, recommend NGT to LIWS, s/p neostigmine 10/1, no surgical indications at this point unless he perforates or has ischemia, have signed off 10/4.   Glucose / Insulin: Hx DM, no meds PTA, A1c 7.18 Jun 2020 - CBGs shot up 10/13 (300-400) after starting solumedrol for gout attack, but now fluctuating (range 133-205). - 17 units of resistant SSI given since new TPN bag hung - 35 units regular insulin in TPN: decreased from 45 units due to concern for downtrend yesterday with increased amount 10/15 - remains on lantus 7 units qhs Electrolytes: K slightly improved (4.0) after IV replenishment yesterday, others WNL including corrected calcium - goal K >/= 4.0, Mg >/= 2.0 with ileus Renal: Hx CKD3; SCr stable, BUN now slightly elevated; UOP remains adequate Hepatic: LFTs WNL; tbili stable WNL; albumin remains low but stable Prealbumin: low but slowly improving TG: stable WNL I/O: NGT removed 10/8 - advanced back to soft diet, failed previous advancement on 10/12 but only ate 50% meal - MIVF: NS at [facility].  Remains on IV reglan q8h GI Imaging: - 9/27 CT abd/pelvis: findings consistent with colonic ileus, pneumatosis of the ascending and hepatic flexure of the colon. - 10/3 AXR:  stable diffuse colonic dilatation is noted most consistent with colonic ileus/adynamic ileus with progression and colonic pneumatosis. - 10/9 AXR: Unchanged gaseous dilation of loops of large and small bowel. - 10/12 AXR: Persistent dilated loops of small bowel, compatible with small bowel obstruction. - 10/15 AXR: Stable dilated large and small bowel  loops are noted concerning for ileus. Surgeries / Procedures:   Central access: PICC TPN start date: 10/5  Nutritional Goals (per RD recommendation on 10/6): Kcal:  1850-2050 Protein:  95-110g Fluid:  2L/day  Goal TPN rate is 90 mL/hr (provides 108 g of protein and 2181 kcals per day)  Current Nutrition:  Diet advanced regular 10/14 [name] 1.4 daily and ProSource Plus BID  Plan:   Today at 1800:  Continue TPN at 50 ml/hr - leave at 1/2 rate until consistently taking POs  Electrolytes in TPN:   125 mEq/L of Na  65 mEq/L of K  5 mEq/L of Ca  12 mEq/L of Mg  15 mmol/L of Phos   Cl:Ac ratio max Ac  Standard MVI and trace elements to TPN  Continue resistant scale SSI, change to ACHS CBG checks   Continue insulin in TPN 35 units; continue Lantus 7 units qhs  Continue MIVF at [place] labs on Mon/Thurs  Peggyann Juba, PharmD, [place]: [phone] 09/03/2020, 8:42 AM

## 2020-09-04 DIAGNOSIS — K567 Ileus, unspecified: Secondary | ICD-10-CM | POA: Diagnosis not present

## 2020-09-04 DIAGNOSIS — I82622 Acute embolism and thrombosis of deep veins of left upper extremity: Secondary | ICD-10-CM | POA: Diagnosis not present

## 2020-09-04 DIAGNOSIS — N182 Chronic kidney disease, stage 2 (mild): Secondary | ICD-10-CM | POA: Diagnosis not present

## 2020-09-04 DIAGNOSIS — E872 Acidosis: Secondary | ICD-10-CM | POA: Diagnosis not present

## 2020-09-04 LAB — MAGNESIUM: Magnesium: 1.9 mg/dL (ref 1.7–2.4)

## 2020-09-04 LAB — COMPREHENSIVE METABOLIC PANEL
ALT: 151 U/L — ABNORMAL HIGH (ref 0–44)
AST: 42 U/L — ABNORMAL HIGH (ref 15–41)
Albumin: 2.3 g/dL — ABNORMAL LOW (ref 3.5–5.0)
Alkaline Phosphatase: 87 U/L (ref 38–126)
Anion gap: 11 (ref 5–15)
BUN: 26 mg/dL — ABNORMAL HIGH (ref 6–20)
CO2: 29 mmol/L (ref 22–32)
Calcium: 8.3 mg/dL — ABNORMAL LOW (ref 8.9–10.3)
Chloride: 99 mmol/L (ref 98–111)
Creatinine, Ser: 0.68 mg/dL (ref 0.61–1.24)
GFR, Estimated: >60 mL/min (ref >60)
Glucose, Bld: 175 mg/dL — ABNORMAL HIGH (ref 70–99)
Potassium: 4 mmol/L (ref 3.5–5.1)
Sodium: 139 mmol/L (ref 135–145)
Total Bilirubin: 0.6 mg/dL (ref 0.3–1.2)
Total Protein: 5.6 g/dL — ABNORMAL LOW (ref 6.5–8.1)

## 2020-09-04 LAB — DIFFERENTIAL
Abs Immature Granulocytes: 1.16 10*3/uL — ABNORMAL HIGH (ref 0.00–0.07)
Basophils Absolute: 0.1 10*3/uL (ref 0.0–0.1)
Basophils Relative: 1 %
Eosinophils Absolute: 0 10*3/uL (ref 0.0–0.5)
Eosinophils Relative: 0 %
Immature Granulocytes: 9 %
Lymphocytes Relative: 23 %
Lymphs Abs: 2.9 10*3/uL (ref 0.7–4.0)
Monocytes Absolute: 1 10*3/uL (ref 0.1–1.0)
Monocytes Relative: 8 %
Neutro Abs: 7.8 10*3/uL — ABNORMAL HIGH (ref 1.7–7.7)
Neutrophils Relative %: 59 %

## 2020-09-04 LAB — CBC
HCT: 27 % — ABNORMAL LOW (ref 39.0–52.0)
Hemoglobin: 8.4 g/dL — ABNORMAL LOW (ref 13.0–17.0)
MCH: 24.2 pg — ABNORMAL LOW (ref 26.0–34.0)
MCHC: 31.1 g/dL (ref 30.0–36.0)
MCV: 77.8 fL — ABNORMAL LOW (ref 80.0–100.0)
Platelets: 302 10*3/uL (ref 150–400)
RBC: 3.47 MIL/uL — ABNORMAL LOW (ref 4.22–5.81)
RDW: 22.5 % — ABNORMAL HIGH (ref 11.5–15.5)
WBC: 12.9 10*3/uL — ABNORMAL HIGH (ref 4.0–10.5)
nRBC: 2.6 % — ABNORMAL HIGH (ref 0.0–0.2)

## 2020-09-04 LAB — GLUCOSE, CAPILLARY
Glucose-Capillary: 137 mg/dL — ABNORMAL HIGH (ref 70–99)
Glucose-Capillary: 170 mg/dL — ABNORMAL HIGH (ref 70–99)
Glucose-Capillary: 160 mg/dL — ABNORMAL HIGH (ref 70–99)
Glucose-Capillary: 158 mg/dL — ABNORMAL HIGH (ref 70–99)

## 2020-09-04 LAB — TRIGLYCERIDES: Triglycerides: 85 mg/dL (ref <150)

## 2020-09-04 LAB — PHOSPHORUS: Phosphorus: 3.8 mg/dL (ref 2.5–4.6)

## 2020-09-04 LAB — PREALBUMIN: Prealbumin: 23.1 mg/dL (ref 18–38)

## 2020-09-04 MED ORDER — TRAVASOL 10 % IV SOLN
INTRAVENOUS | Status: DC
  Filled 2020-09-04: qty 600

## 2020-09-04 MED ORDER — METOPROLOL SUCCINATE ER 25 MG PO TB24
25.0000 mg | ORAL_TABLET | Freq: Once | ORAL | Status: AC
  Administered 2020-09-04: 25 mg via ORAL
  Filled 2020-09-04: qty 1

## 2020-09-04 MED ORDER — PANTOPRAZOLE SODIUM 40 MG PO TBEC
40.0000 mg | DELAYED_RELEASE_TABLET | Freq: Every day | ORAL | Status: DC
  Administered 2020-09-04 – 2020-09-11 (×8): 40 mg via ORAL
  Filled 2020-09-04 (×8): qty 1

## 2020-09-04 MED ORDER — METOPROLOL SUCCINATE ER 50 MG PO TB24
75.0000 mg | ORAL_TABLET | Freq: Every day | ORAL | Status: DC
  Administered 2020-09-05 – 2020-09-11 (×7): 75 mg via ORAL
  Filled 2020-09-04 (×7): qty 1

## 2020-09-04 MED ORDER — METOPROLOL SUCCINATE ER 50 MG PO TB24
50.0000 mg | ORAL_TABLET | Freq: Every day | ORAL | Status: DC
  Administered 2020-09-04: 50 mg via ORAL
  Filled 2020-09-04: qty 1

## 2020-09-04 MED ORDER — AMLODIPINE BESYLATE 10 MG PO TABS
10.0000 mg | ORAL_TABLET | Freq: Every day | ORAL | Status: DC
  Administered 2020-09-04 – 2020-09-11 (×8): 10 mg via ORAL
  Filled 2020-09-04 (×8): qty 1

## 2020-09-04 NOTE — Progress Notes (Signed)
PHARMACY - TOTAL PARENTERAL NUTRITION CONSULT NOTE   Indication: Prolonged ileus  Patient Measurements: Height: 5\' 5"  (165.1 cm) Weight: 103.3 kg (227 lb 11.8 oz) IBW/kg (Calculated) : 61.5 TPN AdjBW (KG): 72.7 Body mass index is 37.9 kg/m.  Assessment:  59 yo male with PMH gout, OA, DM2, anemia admitted with upper extremity DVT and development of colonic ileus. GI following, recommend NGT to LIWS, s/p neostigmine 10/1, no surgical indications at this point unless he perforates or has ischemia. Pharmacy consulted to manage TPN.   Glucose / Insulin: Hx DM, no meds PTA, A1c 7.18 Jun 2020 - CBGs shot up 10/13 (300-400) after starting solumedrol for gout attack, but steroids discontinued. BG range 117-256 - 14 units of resistant SSI given/24 hrs. rSSI TID AC - 35 units regular insulin in TPN - remains on lantus 7 units qHS Electrolytes: All lytes WNL including CorrCa (9.7) Renal: Hx CKD3; SCr stable, BUN now slightly elevated; UOP >4L Hepatic: ALT elevated (151), AST slightly elevated (42); tbili stable WNL; albumin remains low but stable Prealbumin: Improved to WNL (8.1 > 23.1) TG: stable WNL I/O: NGT removed 10/8 - advanced back to soft diet, failed previous advancement on 10/12 but only ate 50% meal - MIVF: NS at [facility]. GI Imaging: - 9/27 CT abd/pelvis: findings consistent with colonic ileus, pneumatosis of the ascending and hepatic flexure of the colon. - 10/3 AXR:  stable diffuse colonic dilatation is noted most consistent with colonic ileus/adynamic ileus with progression and colonic pneumatosis. - 10/9 AXR: Unchanged gaseous dilation of loops of large and small bowel. - 10/12 AXR: Persistent dilated loops of small bowel, compatible with small bowel obstruction. - 10/15 AXR: Stable dilated large and small bowel loops are noted concerning for Ileus. Surgeries / Procedures:   Central access: PICC TPN start date: 10/5  Nutritional Goals (per RD recommendation on 10/13): Kcal:   1850-2050 Protein:  95-110g Fluid:  2L/day  Goal TPN rate is 90 mL/hr (provides 108 g of protein and 2181 kcals per day)  Current Nutrition:  Diet advanced regular 10/15 [name] 1.4 daily and Pro[employer] Plus BID  Pt is now having BM, passing gas. Diet advanced to regular, TPN currently at 50% of goal rate per GI recommendations.  Plan:   Today at 1800:  Continue TPN at reduced rate of 50 ml/hr  Electrolytes in TPN: No changes  125 mEq/L of Na  65 mEq/L of K  5 mEq/L of Ca  12 mEq/L of Mg  15 mmol/L of Phos   Cl:Ac ratio max Ac  Standard MVI and trace elements to TPN  Continue CBG checks and rSSI TID AC  Reduce insulin in TPN to 20 units since steroids discontinued; continue Lantus 7 units qhs  Continue MIVF at [facility]   Monitor TPN labs on Mon/Thurs, recheck electrolytes with AM labs tomorrow  Follow for GI recommendations regarding ability to wean completely off of TPN if patient tolerating diet  Lenis Noon, PharmD 09/04/20 12:04 PM

## 2020-09-04 NOTE — Progress Notes (Signed)
Unassigned patient. Subjective: Since I last evaluated the patient, he seems to be doing much better today.Marland Kitchen His abdominal distention has improved significantly and he is tolerating a regular diet and having regular bowel movements.  He denies having any nausea vomiting melena hematochezia.  He has a history of iron deficiency anemia is awaiting work-up for this prior to discharge.  Objective: Vital signs in last 24 hours: Temp:  [97.9 F (36.6 C)] 97.9 F (36.6 C) (10/17 2100) Pulse Rate:  [65-92] 80 (10/18 1600) Resp:  [11-19] 13 (10/18 1600) BP: (135-162)/(82-101) 149/101 (10/18 1600) SpO2:  [94 %-100 %] 97 % (10/18 1600) Last BM Date: 09/03/20  Intake/Output from previous day: 10/17 0701 - 10/18 0700 In: 1395 [P.O.:580; I.V.:615; IV Piggyback:200] Out: 4300 [Urine:4300] Intake/Output this shift: No intake/output data recorded.  General appearance: alert, cooperative, fatigued, no distress, morbidly obese and pale Resp: clear to auscultation bilaterally Cardio: regular rate and rhythm, S1, S2 normal, no murmur, click, rub or gallop GI: soft, distented non-tender; high-pitched gurgly bowel sounds; no masses,  no organomegaly  Lab Results: Recent Labs    09/02/20 0440 09/03/20 0400 09/04/20 0408  WBC 15.7* 15.4* 12.9*  HGB 8.0* 8.4* 8.4*  HCT 25.5* 26.2* 27.0*  PLT 216 251 302   BMET Recent Labs    09/02/20 0440 09/03/20 0400 09/04/20 0408  NA 142 139 139  K 3.4* 4.0 4.0  CL 111 105 99  CO2 23 26 29   GLUCOSE 168* 117* 175*  BUN 24* 24* 26*  CREATININE 0.71 0.65 0.68  CALCIUM 8.6* 8.3* 8.3*   LFT Recent Labs    09/04/20 0408  PROT 5.6*  ALBUMIN 2.3*  AST 42*  ALT 151*  ALKPHOS 87  BILITOT 0.6   Medications: I have reviewed the patient's current medications.  Assessment/Plan: Prolonged small bowel and colonic ileus which seems to improve considerably.  However the patient's bowel sounds are not back to normal I will wait for another day or 2 before  prepping him for colonoscopy and EGD to further work-up his iron deficiency anemia   LOS: 23 days   Juanita Craver 09/04/2020, 7:11 PM

## 2020-09-04 NOTE — Progress Notes (Addendum)
Occupational Therapy Treatment Patient Details Name: [name] MRN: [id number] DOB: 04/26/1961 Today's Date: 09/04/2020    History of present illness 59 yo male admitted to [name] on 9/24 with weakness, joint pain, difficulty urinating. Pt meeting SIRS criteria, possibly secondary to urinary infection. NGT placed 10/1 for ileus. Now clamped. PMHx significant for chronic gouty arthritis of multiple joints, CKD II, IDDM, HTN, HLD, and chronic anemia. Admitted to [name] 8/11-8/24 for AKI, LUE DVT, and joint pain, d/c SNF.   OT comments  This 59 yo male admitted with above presents to acute OT today making excellent progress with basic ADLs with his biggest issue being increased HR with activity (high 90s up to has high as 161 at one point). He was able to stand part of the time to wash his face and brush his teeth, as well as it on 3n1 and wash his lower legs and A with donning his sock.  He will continue to benefit from acute OT with follow up at SNF.  Follow Up Recommendations  SNF;Supervision/Assistance - 24 hour    Equipment Recommendations  None recommended by OT       Precautions / Restrictions Precautions Precautions: Fall Precaution Comments: multiple lines, watch HR (high 90's up to as high as 161 during today's session) Restrictions Weight Bearing Restrictions: No       Mobility Bed Mobility Overal bed mobility: Needs Assistance Bed Mobility: Rolling;Supine to Sit           General bed mobility comments: OOB in recliner  Transfers Overall transfer level: Needs assistance Equipment used: Rolling walker (2 wheeled) Transfers: Sit to/from Stand Sit to Stand: Min guard         General transfer comment: min guard A to ambulate to bathroom and in hallway wiht +2 for safety (follow with chair, have 3n1 handy for pt to sit on prn--all due to increase in HR with activity)    Balance Overall balance assessment: Needs assistance Sitting-balance support: Feet supported;No  upper extremity supported Sitting balance-Leahy Scale: Good     Standing balance support: No upper extremity supported Standing balance-Leahy Scale: Fair Standing balance comment: intermittently standing at sink for washing face and brushing teeth                           ADL either performed or assessed with clinical judgement   ADL Overall ADL's : Needs assistance/impaired     Grooming: Wash/dry face;Oral care;Sitting;Standing;Min guard Grooming Details (indicate cue type and reason): Started out in standing for washing face, but then his HR went up to high (151), so sat down to finish. Let him rest HR back down into high 90's then stood to brush his teeth-again HR up into the 150's.     Lower Body Bathing: Minimal assistance Lower Body Bathing Details (indicate cue type and reason): sitting to wash lower legs and feet       Lower Body Dressing Details (indicate cue type and reason): Mod A to donn his socks--hand to get them started for him and then he completed process Toilet Transfer: Minimal assistance;+2 for safety/equipment;RW;BSC                   Vision Patient Visual Report: No change from baseline            Cognition Arousal/Alertness: Awake/alert Behavior During Therapy: [name] for tasks assessed/performed        Exercises Other Exercises Other Exercises: Pt  remains with edematous UEs, but is consistently trying to use both of them (needed A to don mask due to this)           Pertinent Vitals/ Pain       Pain Assessment: No/denies pain           Frequency  Min 2X/week        Progress Toward Goals    Progress towards OT goals: Progressing toward goals     Plan Discharge plan remains appropriate    Co-evaluation    PT/OT/SLP Co-Evaluation/Treatment: Yes Reason for Co-Treatment: For patient/therapist safety;To address functional/ADL transfers PT goals addressed during session: Mobility/safety with  mobility;Strengthening/ROM OT goals addressed during session: ADL's and self-care;Strengthening/ROM      AM-PAC OT "6 Clicks" Daily Activity     Outcome Measure   Help from another person eating meals?: None Help from another person taking care of personal grooming?: A Little Help from another person toileting, which includes using toliet, bedpan, or urinal?: A Lot Help from another person bathing (including washing, rinsing, drying)?: A Lot Help from another person to put on and taking off regular upper body clothing?: A Lot Help from another person to put on and taking off regular lower body clothing?: A Lot 6 Click Score: 15    End of Session Equipment Utilized During Treatment: Rolling walker;Gait belt  OT Visit Diagnosis: Muscle weakness (generalized) (M62.81);Other abnormalities of gait and mobility (R26.89)   Activity Tolerance  (limited by increase in HR with activity)   Patient Left in chair;with call bell/phone within reach;with chair alarm set           Time: 2330-0762 OT Time Calculation (min): 29 min  Charges: OT General Charges $OT Visit: 1 Visit OT Treatments $Self Care/Home Management : 8-22 mins  [name], OTR/L Acute [employer] Pager [phone] Office [phone]      [name] 09/04/2020, 12:21 PM

## 2020-09-04 NOTE — Progress Notes (Signed)
Physical Therapy Treatment Patient Details Name: [name] MRN: [id number] DOB: [birth year] Today's Date: 09/04/2020    History of Present Illness 59 yo male admitted to [facility] on 9/24 with weakness, joint pain, difficulty urinating. Pt meeting SIRS criteria, possibly secondary to urinary infection. NGT placed 10/1 for ileus. Now clamped. PMHx significant for chronic gouty arthritis of multiple joints, CKD II, IDDM, HTN, HLD, and chronic anemia. Admitted to [facility] 8/11-8/24 for AKI, LUE DVT, and joint pain, d/c SNF.    PT Comments    Progressing with mobility. Requires frequent and multiple rest breaks 2* increased HR with activity (up to 160s bpm). Pt participated well. He is motivated to get better. Will continue to follow.    Follow Up Recommendations  SNF     Equipment Recommendations  None recommended by PT    Recommendations for Other Services       Precautions / Restrictions Precautions Precautions: Fall Precaution Comments: multiple lines, watch HR (high 90's up to as high as 161 during today's session) Restrictions Weight Bearing Restrictions: No    Mobility  Bed Mobility Overal bed mobility: Needs Assistance Bed Mobility: Rolling;Supine to Sit           General bed mobility comments: OOB in recliner  Transfers Overall transfer level: Needs assistance Equipment used: Rolling walker (2 wheeled) Transfers: Sit to/from Stand Sit to Stand: Min guard         General transfer comment: Min guard. Cues for safety, hand placement.  Ambulation/Gait Ambulation/Gait assistance: Min guard;+2 safety/equipment Gait Distance (Feet): 40 Feet Assistive device: Rolling walker (2 wheeled) Gait Pattern/deviations: Step-through pattern;Decreased stride length     General Gait Details: Distance limited by HR (up to 161 bpm). Cues for safety, pacing, seated rest breaks when HR is high. Followed with recliner.   Stairs             Wheelchair Mobility    Modified  Rankin (Stroke Patients Only)       Balance Overall balance assessment: Needs assistance Sitting-balance support: Feet supported;No upper extremity supported Sitting balance-Leahy Scale: Good     Standing balance support: Bilateral upper extremity supported Standing balance-Leahy Scale: Poor Standing balance comment: intermittently standing at sink for washing face and brushing teeth                            Cognition Arousal/Alertness: Awake/alert Behavior During Therapy: WFL for tasks assessed/performed Overall Cognitive Status: Within Functional Limits for tasks assessed Area of Impairment: Safety/judgement;Following commands;Problem solving                       Following Commands: Follows one step commands with increased time;Follows one step commands inconsistently Safety/Judgement: Decreased awareness of safety;Decreased awareness of deficits   Problem Solving: Slow processing;Decreased initiation;Difficulty sequencing;Requires verbal cues;Requires tactile cues General Comments: Said several times during session that he felt like he could go home today (it took +2 total A for bed mobility and supine<>sit)      Exercises Other Exercises Other Exercises: Pt remains with edematous UEs, but is consistently trying to use both of them (needed A to don mask due to this)    General Comments        Pertinent Vitals/Pain Pain Assessment: No/denies pain Faces Pain Scale: No hurt Pain Location: Pt reports pain all over (anytime we helped him move an arm, a leg, roll) he grimaced and moaned Pain Descriptors / Indicators: Other (  Comment) Pain Intervention(s): Monitored during session;Limited activity within patient's tolerance;Repositioned    Home Living                      Prior Function            PT Goals (current goals can now be found in the care plan section) Progress towards PT goals: Progressing toward goals    Frequency    Min  2X/week      PT Plan Current plan remains appropriate    Co-evaluation   Reason for Co-Treatment: For patient/therapist safety;To address functional/ADL transfers PT goals addressed during session: Mobility/safety with mobility;Strengthening/ROM OT goals addressed during session: ADL's and self-care;Strengthening/ROM      AM-PAC PT "6 Clicks" Mobility   Outcome Measure  Help needed turning from your back to your side while in a flat bed without using bedrails?: A Little Help needed moving from lying on your back to sitting on the side of a flat bed without using bedrails?: A Little Help needed moving to and from a bed to a chair (including a wheelchair)?: A Little Help needed standing up from a chair using your arms (e.g., wheelchair or bedside chair)?: A Little Help needed to walk in hospital room?: A Little Help needed climbing 3-5 steps with a railing? : A Lot 6 Click Score: 17    End of Session Equipment Utilized During Treatment: Gait belt Activity Tolerance: Treatment limited secondary to medical complications (Comment) (tachycardia and irregular HR) Patient left: in chair;with call bell/phone within reach;with chair alarm set   PT Visit Diagnosis: Muscle weakness (generalized) (M62.81);Difficulty in walking, not elsewhere classified (R26.2)     Time: 0272-5366 PT Time Calculation (min) (ACUTE ONLY): 29 min  Charges:  $Gait Training: 8-22 mins                        Doreatha Massed, PT Acute Rehabilitation  Office: [phone] Pager: [phone]

## 2020-09-04 NOTE — Progress Notes (Signed)
PROGRESS NOTE    [name]  NID:[id number] DOB: [birth year] DOA: 08/10/2020 PCP: Charlott Rakes, MD    Chief Complaint  Patient presents with  . Leg Swelling  . Medical Clearance    rehab placement     Brief Narrative:  [name] is a 59 yo male with past medical history significant of type 2 diabetes, chronic gout who was recently hospitalized 8/11-8/24 with AKI, at that time found to have left upper extremity DVT in addition to polyarticular gout.  He now comes in with sepsis, UTI, bowel obstruction. He has been on IV heparin due to LUE DVT as well as TPN due to ileus.     Assessment & Plan:   Principal Problem:   Ileus ([place]) Active Problems:   Essential hypertension   Gout   Iron deficiency anemia   Elevated troponin   Generalized weakness   Uncontrolled type 2 diabetes mellitus with hyperglycemia, without long-term current use of insulin (HCC)   Acute deep vein thrombosis (DVT) of brachial vein of left upper extremity (HCC)   Hypokalemia   CKD (chronic kidney disease) stage 2, GFR 60-89 ml/min   Polyarticular gout   Nasogastric tube present   Acidosis, metabolic   Sepsis due to Escherichia coli without acute organ dysfunction ([place])  1 colonic and small bowel 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  Tolerating full liquid diet at this time.  Patient stated had a large bowel movement on 08/15/2020, passing flatus.  Patient stated had a large bowel movement (08/18/2020) however per RN patient with mucus.  Patient also with some nausea and emesis on 08/18/2020. Per RN patient has not had any significant bowel movement since 08/15/2020. Patient stated ambulated in hallways on 08/15/2020 with therapy. Abdominal films done with stable diffuse colonic dilatation is noted most  consistent with colonic ileus/adynamic ileus with progression and colonic pneumatosis..  CT abdomen and pelvis done 08/14/2020 with findings consistent with colonic ileus, pneumatosis of the ascending and hepatic flexure of the colon.  No associated colonic wall thickening or portal venous gas.  Normal small bowel without obstruction.  Hepatic steatosis.  Bilateral renal parenchymal atrophy with lobular contours.  Mild bladder wall thickening.  Patient with no significant change with abdominal distention.  Abdominal films with persistent gaseous distention of large and small bowel loops.  Patient seen by GI and NG tube ordered however unable to be placed per RN.  NG tube placed by GI under guidewire.  Per RN after NG tube placed patient with some relief of abdominal discomfort.  General surgery was following and initially recommended mobilization and GI decompression.  General surgery ordered neostigmine x 1 and per RN patient noted to have watery loose stools after neostigmine was given.  Rectal tube has been ordered per GI.  Repeat abdominal films with unchanged gaseous distention of small bowel and colon compatible with ileus.    NG tube has subsequently been discontinued.  PICC line placed and patient was started on TPN.    General surgery was following but signed off 08/22/2020.  Patient with TPN and once tolerating oral intake will start to wean down TPN.  Patient was placed on a full liquid diet per Dr. Benson Norway who had recommended advancement to a soft diet however patient noted to have some abdominal distention and nausea when placed on a soft diet and was placed  back on a full liquid diet.  Diet advanced to a soft diet 08/31/2020 which patient seems to be tolerating.  Diet advanced to regular diet 09/01/2020 which patient tolerated.  Per RN patient with about 50% of meals.  Patient having some bowel movements and passing gas.  TPN currently at half the full goal rate.  Slowly improving clinically.  Keep  potassium > 4, magnesium > 2.  Supportive care.  Per GI.  2.  Polyarticular gout with osteoarthritis/acute gouty arthritis It is noted that patient had previous work-up with rheumatoid factor and ANA which were negative.  Steroids held on admission.  Allopurinol discontinued on admission.  Colchicine on hold.  Per RN patient with complaints of exquisite right hand pain concerning for an acute gouty flare on 08/30/2020.  Patient placed on IV Solu-Medrol with clinical improvement.  Outpatient follow-up with rheumatology.  3.  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  Patient noted to have been on 70/30 insulin 50 units twice daily.    CBG 137 this morning.  Patient on TPN and insulin being adjusted per pharmacy.   4.  Hypokalemia/hypomagnesemia Potassium at 4.0.  Magnesium at 1.9.  Patient on TPN.  Electrolytes per pharmacy.    5.  Left upper extremity DVT Was on apixaban twice daily.  Patient's diet is slowly being advanced.  Patient was on IV heparin has been transitioned to full dose Lovenox.  Once tolerating a diet appropriately "placed back on home regimen of apixaban.  Follow.   6.  Iron deficiency anemia Status post IV Feraheme 08/12/2020.    Hemoglobin stable at 8.4.  Will need to resume oral iron supplementation on discharge.  Transfusion threshold hemoglobin < 7.   7.  Sepsis secondary to E. coli UTI, Pseudomonas aeruginosa, POA Patient noted to have urine cultures are growing 100,000 colonies of E. coli was initially on Keflex and subsequently transitioned to IV Ancef as patient noted to be n.p.o.  Patient finished a full course of antibiotics on 08/19/2020.  Patient however was having fevers on 08/26/2020 and repeat urinalysis concerning for UTI.  Patient started on IV Rocephin 08/26/2020, blood cultures done 08/26/2020 with no growth to date.  Urine cultures were positive for E. coli as well as Pseudomonas aeruginosa.  Patient noted to have a  fever again on 08/29/2020 with tachycardia and due to concerns for possible pneumonia azithromycin added to regimen of IV Rocephin.  Cultures did show Pseudomonas and as such IV Rocephin discontinued and patient started on IV cefepime 08/30/2020.  Patient afebrile x 4 days. Repeat chest x-ray done negative for any acute infiltrate.  Azithromycin discontinued.  Continue empiric IV cefepime and treat for 7 days total.  Follow.    8.  Fevers Patient noted to have had fevers for the past few days.  Patient initially with fever on 08/26/2020 with repeat urinalysis concerning for UTI.  Patient placed back on IV Rocephin 08/26/2020 and pancultured.  Urine cultures positive for E. coli as well as Pseudomonas and as such antibiotics were broadened to IV cefepime as patient noted to have fevers again on 08/29/2020.  Patient with fevers 08/30/2020.  Currently afebrile x 4 days.  Azithromycin discontinued.  Continue IV cefepime and treat for total of 7 days.  Patient on steroids for acute gouty arthritis.    9.  Prior bilateral knee surgeries PT/OT.  SNF placement.  10.  Chronic kidney disease stage II Stable.     11.  Metabolic acidosis Patient noted to be  acidotic.    Initial concern for abdominal ischemia as patient with significant colonic ileus with no significant improvement early on in the hospitalization.  Lactic acid level within normal limits.  Acidosis resolved with bicarb drip.  Bicarb drip has been discontinued.  Follow.  12.  Nutrition Due to patient's colonic distention/ileus patient had been n.p.o. for several days early on in the hospitalization.    Status post NG tube and rectal tube placement.  PICC line placed and patient was started on TPN per pharmacy.  Patient currently on a regular diet which he is tolerating and ate about 75% of his dinner last night.  Wean TPN.  GI following.  13.  Severe onchogryphosis  14.  QTC prolongation Per RN patient with QTC prolongation of 550.  Repeat  EKG 08/22/2020 with resolution of QTC prolongation.  Keep potassium > 4, magnesium > 2.  15.  Hypertension Change IV metoprolol to oral metoprolol.  Increase Norvasc to 10 mg daily for better blood pressure control.      DVT prophylaxis: Lovenox Code Status: Full Family Communication: Updated patient.  No family at bedside.  Disposition:   Status is: Inpatient    Dispo:  Patient From:    Planned Disposition: [facility]  Expected discharge date: 09/06/20  Medically stable for discharge:  No        Consultants:   Gastroenterology: Dr. Collene Mares 08/14/2020  General surgery: Dr. Hassell Done 08/14/2020  Procedures:   CT angiogram chest 08/10/2020  CT abdomen and pelvis 08/14/2020  2D echo 08/11/2020  Abdominal films 08/18/2020, 08/17/2020, 08/16/2020, 08/14/2020  PICC line placement  Antimicrobials:  Anti-infectives (From admission, onward)   Start     Dose/Rate Route Frequency Ordered Stop   08/30/20 1100  ceFEPIme (MAXIPIME) 2 g in sodium chloride 0.9 % 100 mL IVPB        2 g 200 mL/hr over 30 Minutes Intravenous Every 12 hours 08/30/20 1003     08/29/20 1030  azithromycin (ZITHROMAX) 500 mg in sodium chloride 0.9 % 250 mL IVPB        500 mg 250 mL/hr over 60 Minutes Intravenous Every 24 hours 08/29/20 0940 09/01/20 1405   08/26/20 2300  cefTRIAXone (ROCEPHIN) 1 g in sodium chloride 0.9 % 100 mL IVPB  Status:  Discontinued        1 g 200 mL/hr over 30 Minutes Intravenous Every 24 hours 08/26/20 2209 08/30/20 0956   08/15/20 1600  ceFAZolin (ANCEF) IVPB 1 g/50 mL premix        1 g 100 mL/hr over 30 Minutes Intravenous Every 8 hours 08/15/20 1429 08/19/20 2243   08/14/20 1730  cefTRIAXone (ROCEPHIN) 1 g in sodium chloride 0.9 % 100 mL IVPB  Status:  Discontinued        1 g 200 mL/hr over 30 Minutes Intravenous Every 24 hours 08/14/20 1647 08/15/20 1429   08/12/20 1230  cephALEXin (KEFLEX) capsule 500 mg  Status:  Discontinued        500 mg Oral Every 12 hours  08/12/20 1139 08/14/20 1647       Subjective: Patient sitting up in chair.  Denies any nausea or emesis.  Tolerating current diet.  Passing flatus.  Having bowel movements.  No chest pain.  No shortness of breath.  Right hand pain and swelling improving.    Objective: Vitals:   09/04/20 0000 09/04/20 0500 09/04/20 0600 09/04/20 0800  BP: 135/82 (!) 161/97 (!) 162/87 (!) 158/97  Pulse: 69 92 65 69  Resp: 14 11 11 11   Temp:      TempSrc:      SpO2: 96% 94% 98% 99%  Weight:      Height:        Intake/Output Summary (Last 24 hours) at 09/04/2020 1202 Last data filed at 09/04/2020 3335 Gross per 24 hour  Intake 1154.95 ml  Output 4100 ml  Net -2945.05 ml   Filed Weights   08/21/20 0500 08/22/20 0509 08/28/20 1820  Weight: 108.4 kg 105.3 kg 103.3 kg    Examination:  General exam: NAD. Respiratory system: CTA B.  No wheezes, no crackles, no rhonchi.  Normal respiratory effort.  Speaking in full sentences.  Cardiovascular system: RRR no murmurs rubs or gallops.  No JVD.  Trace bilateral lower extremity edema. Gastrointestinal system: Abdomen is soft, distended, positive bowel sounds, nontender to palpation.  No rebound.  No guarding. Central nervous system: Alert and oriented. No focal neurological deficits. Extremities: Right hand swelling decreased, decreased tenderness to palpation. Skin: No rashes, lesions or ulcers Psychiatry: Judgement and insight appear normal. Mood & affect appropriate.     Data Reviewed: I have personally reviewed following labs and imaging studies  CBC: Recent Labs  Lab 08/31/20 0439 09/01/20 0455 09/02/20 0440 09/03/20 0400 09/04/20 0408  WBC 11.1* 15.9* 15.7* 15.4* 12.9*  NEUTROABS  --   --   --   --  7.8*  HGB 7.5* 7.4* 8.0* 8.4* 8.4*  HCT 24.4* 23.8* 25.5* 26.2* 27.0*  MCV 79.5* 78.3* 78.5* 78.4* 77.8*  PLT 143* 174 216 251 456    Basic Metabolic Panel: Recent Labs  Lab 08/29/20 0425 08/29/20 0425 08/30/20 0439 08/30/20 0439  08/31/20 0439 09/01/20 0455 09/02/20 0440 09/03/20 0400 09/04/20 0408  NA 132*   < > 134*   < > 135 140 142 139 139  K 4.6   < > 3.5   < > 3.5 3.2* 3.4* 4.0 4.0  CL 104   < > 104   < > 104 109 111 105 99  CO2 21*   < > 22   < > 21* 23 23 26 29   GLUCOSE 294*   < > 193*   < > 398* 210* 168* 117* 175*  BUN 15   < > 15   < > 20 22* 24* 24* 26*  CREATININE 0.69   < > 0.64   < > 0.66 0.65 0.71 0.65 0.68  CALCIUM 8.3*   < > 8.2*   < > 8.6* 8.6* 8.6* 8.3* 8.3*  MG 1.8   < > 2.1   < > 2.1 2.2 2.2 2.0 1.9  PHOS 3.6  --  3.9  --  3.2  --   --   --  3.8   < > = values in this interval not displayed.    GFR: Estimated Creatinine Clearance: 108.6 mL/min (by C-G formula based on SCr of 0.68 mg/dL).  Liver Function Tests: Recent Labs  Lab 08/29/20 0425 08/30/20 0439 08/31/20 0439 09/04/20 0408  AST 33 36 40 42*  ALT 41 47* 56* 151*  ALKPHOS 95 110 119 87  BILITOT 0.4 0.6 0.5 0.6  PROT 6.0* 5.9* 6.3* 5.6*  ALBUMIN 2.5* 2.4* 2.3* 2.3*    CBG: Recent Labs  Lab 09/03/20 1148 09/03/20 1611 09/03/20 2101 09/04/20 0747 09/04/20 1139  GLUCAP 158* 197* 256* 137* 170*     Recent Results (from the past 240 hour(s))  Culture, blood (routine x 2)     Status: None  Collection Time: 08/26/20  2:27 PM   Specimen: BLOOD  Result Value Ref Range Status   Specimen Description   Final    BLOOD LEFT FOOT Performed at [facility] [address], [place], [place] [address]    Special Requests   Final    BOTTLES DRAWN AEROBIC AND ANAEROBIC Blood Culture adequate volume Performed at [facility] [address], [place], [place] [address]    Culture   Final    NO GROWTH 5 DAYS Performed at [facility], [place] [address] [place], [place] [address]    Report Status 08/31/2020 FINAL  Final  Culture, Urine     Status: Abnormal   Collection Time: 08/26/20  5:44 PM   Specimen: Urine, Clean Catch  Result Value Ref Range Status   Specimen Description  URINE, CLEAN CATCH  Final   Special Requests NONE  Final   Culture (A)  Final    >=100,000 COLONIES/mL ESCHERICHIA COLI >=100,000 COLONIES/mL PSEUDOMONAS AERUGINOSA    Report Status 08/31/2020 FINAL  Final   Organism ID, Bacteria ESCHERICHIA COLI (A)  Final   Organism ID, Bacteria PSEUDOMONAS AERUGINOSA (A)  Final      Susceptibility   Escherichia coli - MIC*    AMPICILLIN 4 SENSITIVE Sensitive     CEFAZOLIN <=4 SENSITIVE Sensitive     CEFTRIAXONE <=0.25 SENSITIVE Sensitive     CIPROFLOXACIN <=0.25 SENSITIVE Sensitive     GENTAMICIN <=1 SENSITIVE Sensitive     IMIPENEM <=0.25 SENSITIVE Sensitive     NITROFURANTOIN <=16 SENSITIVE Sensitive     TRIMETH/SULFA <=20 SENSITIVE Sensitive     AMPICILLIN/SULBACTAM <=2 SENSITIVE Sensitive     PIP/TAZO <=4 SENSITIVE Sensitive     * >=100,000 COLONIES/mL ESCHERICHIA COLI   Pseudomonas aeruginosa - MIC*    CEFTAZIDIME 4 SENSITIVE Sensitive     CIPROFLOXACIN <=0.25 SENSITIVE Sensitive     GENTAMICIN <=1 SENSITIVE Sensitive     IMIPENEM 1 SENSITIVE Sensitive     PIP/TAZO <=4 SENSITIVE Sensitive     CEFEPIME 2 SENSITIVE Sensitive     * >=100,000 COLONIES/mL PSEUDOMONAS AERUGINOSA  Culture, blood (Routine X 2) w Reflex to ID Panel     Status: None (Preliminary result)   Collection Time: 08/31/20 10:31 AM   Specimen: BLOOD  Result Value Ref Range Status   Specimen Description   Final    BLOOD LEFT FOOT Performed at [facility], [place] [address] [place], [place] [address]    Special Requests   Final    BOTTLES DRAWN AEROBIC AND ANAEROBIC Blood Culture adequate volume Performed at [facility] [address], [place], [place] [address]    Culture   Final    NO GROWTH 4 DAYS Performed at [facility], [place] [address] [place], [place] [address]    Report Status PENDING  Incomplete  Culture, blood (Routine X 2) w Reflex to ID Panel     Status: None (Preliminary result)   Collection Time: 08/31/20  10:46 AM   Specimen: BLOOD  Result Value Ref Range Status   Specimen Description   Final    BLOOD RIGHT FOOT Performed at [facility] [address], [place], [place] [address]    Special Requests   Final    BOTTLES DRAWN AEROBIC AND ANAEROBIC Blood Culture adequate volume Performed at [facility] [address] [place], [place] [address]    Culture   Final  NO GROWTH 4 DAYS Performed at [facility], [place] [address], [place], [place] [address]    Report Status PENDING  Incomplete         Radiology Studies: No results found.      Scheduled Meds: . (feeding supplement) PROSource Plus  30 mL Oral BID BM  . amLODipine  10 mg Oral Daily  . Chlorhexidine Gluconate Cloth  6 each Topical Daily  . enoxaparin (LOVENOX) injection  100 mg Subcutaneous Q12H  . feeding supplement (KATE FARMS STANDARD 1.4)  325 mL Oral Daily  . insulin aspart  0-20 Units Subcutaneous TID WC  . insulin aspart  0-5 Units Subcutaneous QHS  . insulin glargine  7 Units Subcutaneous QHS  . metoCLOPramide (REGLAN) injection  5 mg Intravenous Q8H  . metoprolol succinate  50 mg Oral Daily  . pantoprazole  40 mg Oral Q0600  . polyethylene glycol  17 g Oral Daily  . sodium chloride flush  10-40 mL Intracatheter Q12H   Continuous Infusions: . sodium chloride 10 mL/hr at 09/03/20 0507  . ceFEPime (MAXIPIME) IV 2 g (09/04/20 1156)  . TPN ADULT (ION) 50 mL/hr at 09/03/20 1727     LOS: 23 days    Time spent: 35 minutes    Irine Seal, MD [facility]   To contact the attending provider between 7A-7P or the covering provider during after hours 7P-7A, please log into the web site [email or web address] and access using universal [place] password for that web site. If you do not have the password, please call the hospital operator.  09/04/2020, 12:02 PM

## 2020-09-05 DIAGNOSIS — K567 Ileus, unspecified: Secondary | ICD-10-CM | POA: Diagnosis not present

## 2020-09-05 DIAGNOSIS — I82622 Acute embolism and thrombosis of deep veins of left upper extremity: Secondary | ICD-10-CM | POA: Diagnosis not present

## 2020-09-05 DIAGNOSIS — E872 Acidosis: Secondary | ICD-10-CM | POA: Diagnosis not present

## 2020-09-05 DIAGNOSIS — N182 Chronic kidney disease, stage 2 (mild): Secondary | ICD-10-CM | POA: Diagnosis not present

## 2020-09-05 LAB — GLUCOSE, CAPILLARY
Glucose-Capillary: 188 mg/dL — ABNORMAL HIGH (ref 70–99)
Glucose-Capillary: 185 mg/dL — ABNORMAL HIGH (ref 70–99)
Glucose-Capillary: 131 mg/dL — ABNORMAL HIGH (ref 70–99)
Glucose-Capillary: 113 mg/dL — ABNORMAL HIGH (ref 70–99)

## 2020-09-05 LAB — CULTURE, BLOOD (ROUTINE X 2)
Culture: NO GROWTH
Culture: NO GROWTH
Special Requests: ADEQUATE
Special Requests: ADEQUATE

## 2020-09-05 LAB — PHOSPHORUS: Phosphorus: 3 mg/dL (ref 2.5–4.6)

## 2020-09-05 LAB — CBC
HCT: 28.4 % — ABNORMAL LOW (ref 39.0–52.0)
Hemoglobin: 8.6 g/dL — ABNORMAL LOW (ref 13.0–17.0)
MCH: 23.9 pg — ABNORMAL LOW (ref 26.0–34.0)
MCHC: 30.3 g/dL (ref 30.0–36.0)
MCV: 78.9 fL — ABNORMAL LOW (ref 80.0–100.0)
Platelets: 306 10*3/uL (ref 150–400)
RBC: 3.6 MIL/uL — ABNORMAL LOW (ref 4.22–5.81)
RDW: 23.1 % — ABNORMAL HIGH (ref 11.5–15.5)
WBC: 9.5 10*3/uL (ref 4.0–10.5)
nRBC: 2.4 % — ABNORMAL HIGH (ref 0.0–0.2)

## 2020-09-05 LAB — BASIC METABOLIC PANEL
Anion gap: 10 (ref 5–15)
BUN: 21 mg/dL — ABNORMAL HIGH (ref 6–20)
CO2: 30 mmol/L (ref 22–32)
Calcium: 8.2 mg/dL — ABNORMAL LOW (ref 8.9–10.3)
Chloride: 95 mmol/L — ABNORMAL LOW (ref 98–111)
Creatinine, Ser: 0.67 mg/dL (ref 0.61–1.24)
GFR, Estimated: >60 mL/min (ref >60)
Glucose, Bld: 156 mg/dL — ABNORMAL HIGH (ref 70–99)
Potassium: 4.1 mmol/L (ref 3.5–5.1)
Sodium: 135 mmol/L (ref 135–145)

## 2020-09-05 LAB — MAGNESIUM: Magnesium: 1.7 mg/dL (ref 1.7–2.4)

## 2020-09-05 MED ORDER — ADULT MULTIVITAMIN W/MINERALS CH
1.0000 | ORAL_TABLET | Freq: Every day | ORAL | Status: DC
  Administered 2020-09-06 – 2020-09-11 (×6): 1 via ORAL
  Filled 2020-09-05 (×6): qty 1

## 2020-09-05 MED ORDER — TRAVASOL 10 % IV SOLN: INTRAVENOUS | Status: AC

## 2020-09-05 MED ORDER — MAGNESIUM SULFATE 4 GM/100ML IV SOLN
4.0000 g | Freq: Once | INTRAVENOUS | Status: AC
  Administered 2020-09-05: 4 g via INTRAVENOUS
  Filled 2020-09-05: qty 100

## 2020-09-05 MED ORDER — INSULIN ASPART 100 UNIT/ML ~~LOC~~ SOLN
0.0000 [IU] | Freq: Three times a day (TID) | SUBCUTANEOUS | Status: DC
  Administered 2020-09-06: 2 [IU] via SUBCUTANEOUS
  Administered 2020-09-06 – 2020-09-07 (×2): 3 [IU] via SUBCUTANEOUS
  Administered 2020-09-08: 5 [IU] via SUBCUTANEOUS
  Administered 2020-09-09: 3 [IU] via SUBCUTANEOUS
  Administered 2020-09-09: 1 [IU] via SUBCUTANEOUS
  Administered 2020-09-09 – 2020-09-10 (×2): 3 [IU] via SUBCUTANEOUS
  Administered 2020-09-11: 2 [IU] via SUBCUTANEOUS

## 2020-09-05 NOTE — H&P (View-Only) (Signed)
Subjective: No complains.  Feeling well.  Objective: Vital signs in last 24 hours: Temp:  [97.9 F (36.6 C)-98.1 F (36.7 C)] 97.9 F (36.6 C) (10/19 1500) Pulse Rate:  [81-86] 86 (10/19 1400) Resp:  [13-16] 14 (10/19 1400) BP: (123-137)/(78-92) 125/90 (10/19 1400) SpO2:  [97 %-100 %] 97 % (10/19 1400) Weight:  [103.6 kg] 103.6 kg (10/19 1424) Last BM Date: 09/05/20  Intake/Output from previous day: 10/18 0701 - 10/19 0700 In: 1817.7 [P.O.:240; I.V.:1377.7; IV Piggyback:200] Out: 1450 [Urine:1450] Intake/Output this shift: Total I/O In: 2046.3 [P.O.:600; I.V.:1302.6; IV Piggyback:143.7] Out: -   General appearance: alert and no distress GI: soft, distended, tympanic  Lab Results: Recent Labs    09/03/20 0400 09/04/20 0408 09/05/20 0506  WBC 15.4* 12.9* 9.5  HGB 8.4* 8.4* 8.6*  HCT 26.2* 27.0* 28.4*  PLT 251 302 306   BMET Recent Labs    09/03/20 0400 09/04/20 0408 09/05/20 0506  NA 139 139 135  K 4.0 4.0 4.1  CL 105 99 95*  CO2 26 29 30   GLUCOSE 117* 175* 156*  BUN 24* 26* 21*  CREATININE 0.65 0.68 0.67  CALCIUM 8.3* 8.3* 8.2*   LFT Recent Labs    09/04/20 0408  PROT 5.6*  ALBUMIN 2.3*  AST 42*  ALT 151*  ALKPHOS 87  BILITOT 0.6   PT/INR No results for input(s): LABPROT, INR in the last 72 hours. Hepatitis Panel No results for input(s): HEPBSAG, HCVAB, HEPAIGM, HEPBIGM in the last 72 hours. C-Diff No results for input(s): CDIFFTOX in the last 72 hours. Fecal Lactopherrin No results for input(s): FECLLACTOFRN in the last 72 hours.  Studies/Results: No results found.  Medications:  Scheduled:  (feeding supplement) PROSource Plus  30 mL Oral BID BM   amLODipine  10 mg Oral Daily   Chlorhexidine Gluconate Cloth  6 each Topical Daily   enoxaparin (LOVENOX) injection  100 mg Subcutaneous Q12H   feeding supplement (KATE FARMS STANDARD 1.4)  325 mL Oral Daily   [START ON 09/06/2020] insulin aspart  0-15 Units Subcutaneous TID WC    insulin aspart  0-20 Units Subcutaneous TID WC   insulin aspart  0-5 Units Subcutaneous QHS   insulin glargine  7 Units Subcutaneous QHS   metoCLOPramide (REGLAN) injection  5 mg Intravenous Q8H   metoprolol succinate  75 mg Oral Daily   [START ON 09/06/2020] multivitamin with minerals  1 tablet Oral Daily   pantoprazole  40 mg Oral Q0600   polyethylene glycol  17 g Oral Daily   sodium chloride flush  10-40 mL Intracatheter Q12H   Continuous:  sodium chloride Stopped (09/05/20 1457)   ceFEPime (MAXIPIME) IV Stopped (09/05/20 1127)   TPN ADULT (ION) 25 mL/hr at 09/05/20 1448    Assessment/Plan: 1) Chronic ileus. 2) Anemia. 3) Heme positive stool.   He is tolerating PO without any issues and his TPN is being weaned with plans to discontinue today.  Since he is still in the hospital it is best to perform the EGD/colonoscopy in the hospital.  Even though he has the ileus, it appears that he is moving his bowels adequately and he can be safely started on a prep.  Plan: 1) EGD/colonoscopy on Thursday. 2) Clear liquid diet tomorrow.  LOS: 24 days   Raynor Calcaterra D 09/05/2020, 4:35 PM

## 2020-09-05 NOTE — Progress Notes (Signed)
Subjective: No complains.  Feeling well.  Objective: Vital signs in last 24 hours: Temp:  [97.9 F (36.6 C)-98.1 F (36.7 C)] 97.9 F (36.6 C) (10/19 1500) Pulse Rate:  [81-86] 86 (10/19 1400) Resp:  [13-16] 14 (10/19 1400) BP: (123-137)/(78-92) 125/90 (10/19 1400) SpO2:  [97 %-100 %] 97 % (10/19 1400) Weight:  [103.6 kg] 103.6 kg (10/19 1424) Last BM Date: 09/05/20  Intake/Output from previous day: 10/18 0701 - 10/19 0700 In: 1817.7 [P.O.:240; I.V.:1377.7; IV Piggyback:200] Out: 1450 [Urine:1450] Intake/Output this shift: Total I/O In: 2046.3 [P.O.:600; I.V.:1302.6; IV Piggyback:143.7] Out: -   General appearance: alert and no distress GI: soft, distended, tympanic  Lab Results: Recent Labs    09/03/20 0400 09/04/20 0408 09/05/20 0506  WBC 15.4* 12.9* 9.5  HGB 8.4* 8.4* 8.6*  HCT 26.2* 27.0* 28.4*  PLT 251 302 306   BMET Recent Labs    09/03/20 0400 09/04/20 0408 09/05/20 0506  NA 139 139 135  K 4.0 4.0 4.1  CL 105 99 95*  CO2 26 29 30   GLUCOSE 117* 175* 156*  BUN 24* 26* 21*  CREATININE 0.65 0.68 0.67  CALCIUM 8.3* 8.3* 8.2*   LFT Recent Labs    09/04/20 0408  PROT 5.6*  ALBUMIN 2.3*  AST 42*  ALT 151*  ALKPHOS 87  BILITOT 0.6   PT/INR No results for input(s): LABPROT, INR in the last 72 hours. Hepatitis Panel No results for input(s): HEPBSAG, HCVAB, HEPAIGM, HEPBIGM in the last 72 hours. C-Diff No results for input(s): CDIFFTOX in the last 72 hours. Fecal Lactopherrin No results for input(s): FECLLACTOFRN in the last 72 hours.  Studies/Results: No results found.  Medications:  Scheduled: . (feeding supplement) PROSource Plus  30 mL Oral BID BM  . amLODipine  10 mg Oral Daily  . Chlorhexidine Gluconate Cloth  6 each Topical Daily  . enoxaparin (LOVENOX) injection  100 mg Subcutaneous Q12H  . feeding supplement (KATE FARMS STANDARD 1.4)  325 mL Oral Daily  . [START ON 09/06/2020] insulin aspart  0-15 Units Subcutaneous TID WC  .  insulin aspart  0-20 Units Subcutaneous TID WC  . insulin aspart  0-5 Units Subcutaneous QHS  . insulin glargine  7 Units Subcutaneous QHS  . metoCLOPramide (REGLAN) injection  5 mg Intravenous Q8H  . metoprolol succinate  75 mg Oral Daily  . [START ON 09/06/2020] multivitamin with minerals  1 tablet Oral Daily  . pantoprazole  40 mg Oral Q0600  . polyethylene glycol  17 g Oral Daily  . sodium chloride flush  10-40 mL Intracatheter Q12H   Continuous: . sodium chloride Stopped (09/05/20 1457)  . ceFEPime (MAXIPIME) IV Stopped (09/05/20 1127)  . TPN ADULT (ION) 25 mL/hr at 09/05/20 1448    Assessment/Plan: 1) Chronic ileus. 2) Anemia. 3) Heme positive stool.   He is tolerating PO without any issues and his TPN is being weaned with plans to discontinue today.  Since he is still in the hospital it is best to perform the EGD/colonoscopy in the hospital.  Even though he has the ileus, it appears that he is moving his bowels adequately and he can be safely started on a prep.  Plan: 1) EGD/colonoscopy on Thursday. 2) Clear liquid diet tomorrow.  LOS: 24 days   [name] D 09/05/2020, 4:35 PM

## 2020-09-05 NOTE — Progress Notes (Addendum)
PROGRESS NOTE    [name]  RSW:[id number] DOB: 06/15/1961 DOA: 08/10/2020 PCP: Charlott Rakes, MD    Chief Complaint  Patient presents with  . Leg Swelling  . Medical Clearance    rehab placement     Brief Narrative:  [name] is a 59 yo male with past medical history significant of type 2 diabetes, chronic gout who was recently hospitalized 8/11-8/24 with AKI, at that time found to have left upper extremity DVT in addition to polyarticular gout.  He now comes in with sepsis, UTI, bowel obstruction. He has been on IV heparin due to LUE DVT as well as TPN due to ileus.     Assessment & Plan:   Principal Problem:   Ileus ([place]) Active Problems:   Essential hypertension   Gout   Iron deficiency anemia   Elevated troponin   Generalized weakness   Uncontrolled type 2 diabetes mellitus with hyperglycemia, without long-term current use of insulin (HCC)   Acute deep vein thrombosis (DVT) of brachial vein of left upper extremity (HCC)   Hypokalemia   CKD (chronic kidney disease) stage 2, GFR 60-89 ml/min   Polyarticular gout   Nasogastric tube present   Acidosis, metabolic   Sepsis due to Escherichia coli without acute organ dysfunction ([place])  1 colonic and small bowel 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  Tolerating full liquid diet at this time.  Patient stated had a large bowel movement on 08/15/2020, passing flatus.  Patient stated had a large bowel movement (08/18/2020) however per RN patient with mucus.  Patient also with some nausea and emesis on 08/18/2020. Per RN patient has not had any significant bowel movement since 08/15/2020. Patient stated ambulated in hallways on 08/15/2020 with therapy. Abdominal films done with stable diffuse colonic dilatation is noted most  consistent with colonic ileus/adynamic ileus with progression and colonic pneumatosis..  CT abdomen and pelvis done 08/14/2020 with findings consistent with colonic ileus, pneumatosis of the ascending and hepatic flexure of the colon.  No associated colonic wall thickening or portal venous gas.  Normal small bowel without obstruction.  Hepatic steatosis.  Bilateral renal parenchymal atrophy with lobular contours.  Mild bladder wall thickening.  Patient with no significant change with abdominal distention.  Abdominal films with persistent gaseous distention of large and small bowel loops.  Patient seen by GI and NG tube ordered however unable to be placed per RN.  NG tube placed by GI under guidewire.  Per RN after NG tube placed patient with some relief of abdominal discomfort.  General surgery was following and initially recommended mobilization and GI decompression.  General surgery ordered neostigmine x 1 and per RN patient noted to have watery loose stools after neostigmine was given.  Rectal tube has been ordered per GI.  Repeat abdominal films with unchanged gaseous distention of small bowel and colon compatible with ileus.    NG tube has subsequently been discontinued.  PICC line placed and patient was started on TPN.    General surgery was following but signed off 08/22/2020.  Patient with TPN and once tolerating oral intake will start to wean down TPN.  Patient was placed on a full liquid diet per Dr. Benson Norway who had recommended advancement to a soft diet however patient noted to have some abdominal distention and nausea when placed on a soft diet and was placed  back on a full liquid diet.  Diet advanced to a soft diet 08/31/2020 which patient tolerated.  Diet has been advanced to regular diet 09/01/2020 which patient is tolerating.  TPN to be discontinued today.  Clinical improvement. Keep potassium > 4, magnesium > 2.  Supportive care.  Per GI.  2.  Polyarticular gout with osteoarthritis/acute gouty  arthritis It is noted that patient had previous work-up with rheumatoid factor and ANA which were negative.  Steroids held on admission.  Allopurinol discontinued on admission.  Colchicine on hold.  Per RN patient with complaints of exquisite right hand pain concerning for an acute gouty flare on 08/30/2020.  Patient placed on IV Solu-Medrol with clinical improvement.  Outpatient follow-up with rheumatology.  3.  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  Patient noted to have been on 70/30 insulin 50 units twice daily.  CBG  188 this morning.  Patient on TPN which has been discontinued.  Continue Lantus 7 units daily.  Sliding scale insulin.   4.  Hypokalemia/hypomagnesemia Potassium of 4.1.  Magnesium at 1.7.  Patient was on TPN which has been discontinued today.  Magnesium sulfate 4 g IV x1.  Repeat labs in the morning.  5.  Left upper extremity DVT Was on apixaban twice daily.  Patient's diet is slowly being advanced.  Patient was on IV heparin has been transitioned to full dose Lovenox.  Could likely place back on apixaban after upper endoscopy and colonoscopy are completed and when okay with GI.   6.  Iron deficiency anemia Status post IV Feraheme 08/12/2020.    Hemoglobin currently stable at 8.6.  Will need to resume oral iron supplementation on discharge.  Patient being followed by GI and patient for upper endoscopy and colonoscopy on Thursday, 09/07/2020.  Transfusion threshold hemoglobin <7.   7.  Sepsis secondary to E. coli UTI, Pseudomonas aeruginosa, POA Patient noted to have urine cultures are growing 100,000 colonies of E. coli was initially on Keflex and subsequently transitioned to IV Ancef as patient noted to be n.p.o.  Patient finished a full course of antibiotics on 08/19/2020.  Patient however was having fevers on 08/26/2020 and repeat urinalysis concerning for UTI.  Patient started on IV Rocephin 08/26/2020, blood cultures done 08/26/2020  with no growth to date.  Urine cultures were positive for E. coli as well as Pseudomonas aeruginosa.  Patient noted to have a fever again on 08/29/2020 with tachycardia and due to concerns for possible pneumonia azithromycin added to regimen of IV Rocephin.  Cultures did show Pseudomonas and as such IV Rocephin discontinued and patient started on IV cefepime 08/30/2020.  Patient afebrile x 5 days. Repeat chest x-ray done negative for any acute infiltrate.  Azithromycin discontinued.  Discontinue IV cefepime after today's doses as patient would have completed a 7-day course of treatment.   8.  Fevers Patient noted to have had fevers for the past few days.  Patient initially with fever on 08/26/2020 with repeat urinalysis concerning for UTI.  Patient placed back on IV Rocephin 08/26/2020 and pancultured.  Urine cultures positive for E. coli as well as Pseudomonas and as such antibiotics were broadened to IV cefepime as patient noted to have fevers again on 08/29/2020.  Patient with fevers 08/30/2020.  Currently afebrile x 4 days.  Azithromycin discontinued.  Discontinue IV cefepime after today's doses as patient would have completed a 7-day course.  Patient was on steroids for acute gouty arthritis.   9.  Prior bilateral knee surgeries PT/OT.  Needs SNF placement when medically stable.    10.  Chronic kidney disease stage II Stable.     11.  Metabolic acidosis Patient noted to be acidotic.    Initial concern for abdominal ischemia as patient with significant colonic ileus with no significant improvement early on in the hospitalization.  Lactic acid level within normal limits.  Acidosis resolved with bicarb drip.  Bicarb drip has been discontinued.  Follow.  12.  Nutrition Due to patient's colonic distention/ileus patient had been n.p.o. for several days early on in the hospitalization.    Status post NG tube and rectal tube placement.  PICC line placed and patient was started on TPN per pharmacy.   Patient currently tolerating regular diet.  Discontinue TPN.  Follow.    13.  Severe onchogryphosis  14.  QTC prolongation Per RN patient with QTC prolongation of 550.  Repeat EKG 08/22/2020 with resolution of QTC prolongation.  Keep potassium > 4, magnesium > 2.  15.  Hypertension Current regimen of metoprolol and Norvasc.      DVT prophylaxis: Lovenox Code Status: Full Family Communication: Updated patient.  Updated sister-in-law Rickey Primus on the telephone.  Disposition:   Status is: Inpatient    Dispo:  Patient From:    Planned Disposition: [facility]  Expected discharge date: 09/08/20  Medically stable for discharge:  No        Consultants:   Gastroenterology: Dr. Collene Mares 08/14/2020  General surgery: Dr. Hassell Done 08/14/2020  Procedures:   CT angiogram chest 08/10/2020  CT abdomen and pelvis 08/14/2020  2D echo 08/11/2020  Abdominal films 08/18/2020, 08/17/2020, 08/16/2020, 08/14/2020  PICC line placement  Antimicrobials:  Anti-infectives (From admission, onward)   Start     Dose/Rate Route Frequency Ordered Stop   08/30/20 1100  ceFEPIme (MAXIPIME) 2 g in sodium chloride 0.9 % 100 mL IVPB        2 g 200 mL/hr over 30 Minutes Intravenous Every 12 hours 08/30/20 1003 09/05/20 2359   08/29/20 1030  azithromycin (ZITHROMAX) 500 mg in sodium chloride 0.9 % 250 mL IVPB        500 mg 250 mL/hr over 60 Minutes Intravenous Every 24 hours 08/29/20 0940 09/01/20 1405   08/26/20 2300  cefTRIAXone (ROCEPHIN) 1 g in sodium chloride 0.9 % 100 mL IVPB  Status:  Discontinued        1 g 200 mL/hr over 30 Minutes Intravenous Every 24 hours 08/26/20 2209 08/30/20 0956   08/15/20 1600  ceFAZolin (ANCEF) IVPB 1 g/50 mL premix        1 g 100 mL/hr over 30 Minutes Intravenous Every 8 hours 08/15/20 1429 08/19/20 2243   08/14/20 1730  cefTRIAXone (ROCEPHIN) 1 g in sodium chloride 0.9 % 100 mL IVPB  Status:  Discontinued        1 g 200 mL/hr over 30 Minutes  Intravenous Every 24 hours 08/14/20 1647 08/15/20 1429   08/12/20 1230  cephALEXin (KEFLEX) capsule 500 mg  Status:  Discontinued        500 mg Oral Every 12 hours 08/12/20 1139 08/14/20 1647       Subjective: Sitting up in chair.  No nausea or emesis.  No chest pain or shortness of breath.  Tolerating current diet.  No abdominal pain.  Passing flatus.  Having bowel movement.   Objective: Vitals:   09/04/20 2200 09/05/20 0200 09/05/20 1000 09/05/20 1200  BP: 123/84  (!) 137/92 126/78  Pulse: 81  83 85  Resp:  16  13 14   Temp: 98 F (36.7 C) 98.1 F (36.7 C)    TempSrc: Oral Oral    SpO2: 99%  100% 97%  Weight:      Height:        Intake/Output Summary (Last 24 hours) at 09/05/2020 1242 Last data filed at 09/05/2020 1200 Gross per 24 hour  Intake 2453.89 ml  Output 1450 ml  Net 1003.89 ml   Filed Weights   08/21/20 0500 08/22/20 0509 08/28/20 1820  Weight: 108.4 kg 105.3 kg 103.3 kg    Examination:  General exam: NAD. Respiratory system: Lungs clear to auscultation bilaterally.  No wheezes, no crackles, no rhonchi.  Normal respiratory effort.  Speaking in full sentences.  Cardiovascular system: Regular rate rhythm no murmurs rubs or gallops.  No JVD.  Trace bilateral lower extremity edema. Gastrointestinal system: Abdomen is soft, less distended, positive bowel sounds, nontender to palpation.  No rebound.  No guarding.  Central nervous system: Alert and oriented. No focal neurological deficits. Extremities: Right hand swelling decreased, decreased tenderness to palpation. Skin: No rashes, lesions or ulcers Psychiatry: Judgement and insight appear normal. Mood & affect appropriate.     Data Reviewed: I have personally reviewed following labs and imaging studies  CBC: Recent Labs  Lab 09/01/20 0455 09/02/20 0440 09/03/20 0400 09/04/20 0408 09/05/20 0506  WBC 15.9* 15.7* 15.4* 12.9* 9.5  NEUTROABS  --   --   --  7.8*  --   HGB 7.4* 8.0* 8.4* 8.4* 8.6*  HCT  23.8* 25.5* 26.2* 27.0* 28.4*  MCV 78.3* 78.5* 78.4* 77.8* 78.9*  PLT 174 216 251 302 025    Basic Metabolic Panel: Recent Labs  Lab 08/30/20 0439 08/30/20 0439 08/31/20 0439 08/31/20 0439 09/01/20 0455 09/02/20 0440 09/03/20 0400 09/04/20 0408 09/05/20 0506  NA 134*   < > 135   < > 140 142 139 139 135  K 3.5   < > 3.5   < > 3.2* 3.4* 4.0 4.0 4.1  CL 104   < > 104   < > 109 111 105 99 95*  CO2 22   < > 21*   < > 23 23 26 29 30   GLUCOSE 193*   < > 398*   < > 210* 168* 117* 175* 156*  BUN 15   < > 20   < > 22* 24* 24* 26* 21*  CREATININE 0.64   < > 0.66   < > 0.65 0.71 0.65 0.68 0.67  CALCIUM 8.2*   < > 8.6*   < > 8.6* 8.6* 8.3* 8.3* 8.2*  MG 2.1   < > 2.1   < > 2.2 2.2 2.0 1.9 1.7  PHOS 3.9  --  3.2  --   --   --   --  3.8 3.0   < > = values in this interval not displayed.    GFR: Estimated Creatinine Clearance: 108.6 mL/min (by C-G formula based on SCr of 0.67 mg/dL).  Liver Function Tests: Recent Labs  Lab 08/30/20 0439 08/31/20 0439 09/04/20 0408  AST 36 40 42*  ALT 47* 56* 151*  ALKPHOS 110 119 87  BILITOT 0.6 0.5 0.6  PROT 5.9* 6.3* 5.6*  ALBUMIN 2.4* 2.3* 2.3*    CBG: Recent Labs  Lab 09/04/20 1139 09/04/20 1702 09/04/20 2106 09/05/20 0842 09/05/20 1148  GLUCAP 170* 160* 158* 188* 185*     Recent Results (from the past 240 hour(s))  Culture, blood (routine x 2)  Status: None   Collection Time: 08/26/20  2:27 PM   Specimen: BLOOD  Result Value Ref Range Status   Specimen Description   Final    BLOOD LEFT FOOT Performed at [facility] [address] [place], [place] [address]    Special Requests   Final    BOTTLES DRAWN AEROBIC AND ANAEROBIC Blood Culture adequate volume Performed at [facility] [address] [place], [place] [address]    Culture   Final    NO GROWTH 5 DAYS Performed at [facility], [place] [address] [place], [place] [address]    Report Status 08/31/2020 FINAL  Final   Culture, Urine     Status: Abnormal   Collection Time: 08/26/20  5:44 PM   Specimen: Urine, Clean Catch  Result Value Ref Range Status   Specimen Description URINE, CLEAN CATCH  Final   Special Requests NONE  Final   Culture (A)  Final    >=100,000 COLONIES/mL ESCHERICHIA COLI >=100,000 COLONIES/mL PSEUDOMONAS AERUGINOSA    Report Status 08/31/2020 FINAL  Final   Organism ID, Bacteria ESCHERICHIA COLI (A)  Final   Organism ID, Bacteria PSEUDOMONAS AERUGINOSA (A)  Final      Susceptibility   Escherichia coli - MIC*    AMPICILLIN 4 SENSITIVE Sensitive     CEFAZOLIN <=4 SENSITIVE Sensitive     CEFTRIAXONE <=0.25 SENSITIVE Sensitive     CIPROFLOXACIN <=0.25 SENSITIVE Sensitive     GENTAMICIN <=1 SENSITIVE Sensitive     IMIPENEM <=0.25 SENSITIVE Sensitive     NITROFURANTOIN <=16 SENSITIVE Sensitive     TRIMETH/SULFA <=20 SENSITIVE Sensitive     AMPICILLIN/SULBACTAM <=2 SENSITIVE Sensitive     PIP/TAZO <=4 SENSITIVE Sensitive     * >=100,000 COLONIES/mL ESCHERICHIA COLI   Pseudomonas aeruginosa - MIC*    CEFTAZIDIME 4 SENSITIVE Sensitive     CIPROFLOXACIN <=0.25 SENSITIVE Sensitive     GENTAMICIN <=1 SENSITIVE Sensitive     IMIPENEM 1 SENSITIVE Sensitive     PIP/TAZO <=4 SENSITIVE Sensitive     CEFEPIME 2 SENSITIVE Sensitive     * >=100,000 COLONIES/mL PSEUDOMONAS AERUGINOSA  Culture, blood (Routine X 2) w Reflex to ID Panel     Status: None   Collection Time: 08/31/20 10:31 AM   Specimen: BLOOD  Result Value Ref Range Status   Specimen Description   Final    BLOOD LEFT FOOT Performed at [facility], [place] [address], [place], [place] [address]    Special Requests   Final    BOTTLES DRAWN AEROBIC AND ANAEROBIC Blood Culture adequate volume Performed at [facility] [address] [place], [place] [address]    Culture   Final    NO GROWTH 5 DAYS Performed at [facility], [place] [address], [place], [place] [address]    Report  Status 09/05/2020 FINAL  Final  Culture, blood (Routine X 2) w Reflex to ID Panel     Status: None   Collection Time: 08/31/20 10:46 AM   Specimen: BLOOD  Result Value Ref Range Status   Specimen Description   Final    BLOOD RIGHT FOOT Performed at [facility] [address], [place], [place] [address]    Special Requests   Final    BOTTLES DRAWN AEROBIC AND ANAEROBIC Blood Culture adequate volume Performed at [facility] [address], [place], [place] [address]    Culture   Final  NO GROWTH 5 DAYS Performed at [facility], [place] [address], [place], [place] [address]    Report Status 09/05/2020 FINAL  Final         Radiology Studies: No results found.      Scheduled Meds: . (feeding supplement) PROSource Plus  30 mL Oral BID BM  . amLODipine  10 mg Oral Daily  . Chlorhexidine Gluconate Cloth  6 each Topical Daily  . enoxaparin (LOVENOX) injection  100 mg Subcutaneous Q12H  . feeding supplement ([employer] STANDARD 1.4)  325 mL Oral Daily  . [START ON 09/06/2020] insulin aspart  0-15 Units Subcutaneous TID WC  . insulin aspart  0-20 Units Subcutaneous TID WC  . insulin aspart  0-5 Units Subcutaneous QHS  . insulin glargine  7 Units Subcutaneous QHS  . metoCLOPramide (REGLAN) injection  5 mg Intravenous Q8H  . metoprolol succinate  75 mg Oral Daily  . [START ON 09/06/2020] multivitamin with minerals  1 tablet Oral Daily  . pantoprazole  40 mg Oral Q0600  . polyethylene glycol  17 g Oral Daily  . sodium chloride flush  10-40 mL Intracatheter Q12H   Continuous Infusions: . sodium chloride 10 mL/hr at 09/05/20 1200  . ceFEPime (MAXIPIME) IV Stopped (09/05/20 1127)  . magnesium sulfate bolus IVPB 46.5 mL/hr at 09/05/20 1200  . TPN ADULT (ION) 50 mL/hr at 09/04/20 1714     LOS: 24 days    Time spent: 35 minutes    Irine Seal, MD [facility]   To contact the attending provider between 7A-7P or  the covering provider during after hours 7P-7A, please log into the web site [email or web address] and access using universal [place] password for that web site. If you do not have the password, please call the hospital operator.  09/05/2020, 12:42 PM

## 2020-09-05 NOTE — Progress Notes (Signed)
Nutrition Follow-up  DOCUMENTATION CODES:   Obesity unspecified  INTERVENTION:  - continue [name] 1.4 po once/day and 30 ml Prosource Plus BID. - weigh patient today.   NUTRITION DIAGNOSIS:   Inadequate oral intake related to acute illness as evidenced by other (comment) (FLD unable to provide adequate needs). -resolving  GOAL:   Patient will meet greater than or equal to 90% of their needs -met with PO intakes and TPN  MONITOR:   PO intake, Supplement acceptance, Labs, Weight trends, Other (Comment) (TPN regimen)  ASSESSMENT:   59 year old male with past medical history of polyarticular gout, insulin-dependent diabetes mellitus type 2, iron deficiency anemia, hypertension and recent diagnosis of left upper extremity DVT (06/2020) presenting with generalized malaise and weakness.  Yesterday patient ate 100% of breakfast and 100% of lunch (total of 764 kcal, 26 grams protein). Today he has eaten 100% of breakfast and 100% of lunch (total of 644 kcal, 20 grams protein).  Most meals are small and he often orders soup, milk, and cereal.   He is receiving custom TPN at 50 ml/hr with plan to decrease to 25 ml/hr today at 1600 and then no plan for TPN to be ordered once current bag runs out.   He has been accepting Prosource 100% of the time offered (each provides 100 kcal and 15 grams protein) and [employer] ~75% of the time offered (each provides 455 kcal and 20 grams protein).  He has not been weighed since 10/11.    Labs reviewed; CBGs: 188 and 185 mg/dl, Cl: 95 mmol/l, BUN: 21 mg/dl, Ca: 8.2 mg/dl. Medications reviewed; sliding scale novolog, 7 units lantus/day, 5 mg reglan TID, 1 tablet multivitamin with minerals/day, 40 mg oral protonix/day, 17 g miralax/day.   Diet Order:   Diet Order            Diet regular Room service appropriate? Yes; Fluid consistency: Thin  Diet effective now                 EDUCATION NEEDS:   No education needs have been identified at  this time  Skin:  Skin Assessment: Reviewed RN Assessment  Last BM:  10/19  Height:   Ht Readings from Last 1 Encounters:  08/11/20 _0  (1.651 m)    Weight:   Wt Readings from Last 1 Encounters:  08/28/20 103.3 kg     Estimated Nutritional Needs:  Kcal:  1850-2050 Protein:  95-110g Fluid:  2L/day     [name] Matin, MS, RD, LDN, CNSC Inpatient Clinical Dietitian RD pager # available in AM[facility]  After hours/weekend pager # available in [facility]

## 2020-09-05 NOTE — Progress Notes (Signed)
PHARMACY - TOTAL PARENTERAL NUTRITION CONSULT NOTE   Indication: Prolonged ileus  Patient Measurements: Height: 5\' 5"  (165.1 cm) Weight: 103.3 kg (227 lb 11.8 oz) IBW/kg (Calculated) : 61.5 TPN AdjBW (KG): 72.7 Body mass index is 37.9 kg/m.  Assessment:  59 yo male with PMH gout, OA, DM2, anemia admitted with upper extremity DVT and development of colonic ileus. GI following, recommend NGT to LIWS, s/p neostigmine 10/1, no surgical indications at this point unless he perforates or has ischemia. Pharmacy consulted to manage TPN.   Glucose / Insulin: Hx DM, no meds PTA, A1c 7.18 Jun 2020 - CBGs shot up 10/13 (300-400) after starting solumedrol for gout attack, but steroids discontinued. BG range 137-188 - 8 units of resistant SSI given/24 hrs. rSSI TID AC - 20 units regular insulin in TPN - remains on lantus 7 units qHS Electrolytes: All lytes WNL including CorrCa (9.6) Renal: Hx CKD3; SCr stable, BUN decreasing; UOP 1450 mL Hepatic: ALT elevated (151), AST slightly elevated (42); tbili stable WNL; albumin remains low but stable Prealbumin: Improved to WNL (8.1 > 23.1) TG: stable WNL I/O: NGT removed 10/8 - MIVF: NS at [facility]. GI Imaging: - 9/27 CT abd/pelvis: findings consistent with colonic ileus, pneumatosis of the ascending and hepatic flexure of the colon. - 10/3 AXR:  stable diffuse colonic dilatation is noted most consistent with colonic ileus/adynamic ileus with progression and colonic pneumatosis. - 10/9 AXR: Unchanged gaseous dilation of loops of large and small bowel. - 10/12 AXR: Persistent dilated loops of small bowel, compatible with small bowel obstruction. - 10/15 AXR: Stable dilated large and small bowel loops are noted concerning for Ileus. Surgeries / Procedures:   Central access: PICC TPN start date: 10/5  Nutritional Goals (per RD recommendation on 10/13): Kcal:  1850-2050 Protein:  95-110g Fluid:  2L/day  Goal TPN rate is 90 mL/hr (provides 108 g of  protein and 2181 kcals per day)  Current Nutrition:  Diet advanced regular 10/15 [name] 1.4 daily and ProSource Plus BID  TPN currently at half of goal rate since 10/15. Pt on regular diet; 100% of meal charted yesterday.   Plan:  Per discussion with Dr. Collene Mares, will discontinue TPN today.   Plan:  Will reduce TPN rate to 25 mL/hr at 1600 and let order expire at 1800.   Will discontinue all associated labs and TPN orders.  Lantus 7 units qHS to remain active per discussion with TRH. Reduce SSI to moderate  Order MVI PO daily  Pharmacy to sign off.   Lenis Noon, PharmD 09/05/20 10:28 AM

## 2020-09-06 DIAGNOSIS — K567 Ileus, unspecified: Secondary | ICD-10-CM | POA: Diagnosis not present

## 2020-09-06 LAB — RENAL FUNCTION PANEL
Albumin: 2.5 g/dL — ABNORMAL LOW (ref 3.5–5.0)
Anion gap: 7 (ref 5–15)
BUN: 18 mg/dL (ref 6–20)
CO2: 30 mmol/L (ref 22–32)
Calcium: 8.2 mg/dL — ABNORMAL LOW (ref 8.9–10.3)
Chloride: 97 mmol/L — ABNORMAL LOW (ref 98–111)
Creatinine, Ser: 0.66 mg/dL (ref 0.61–1.24)
GFR, Estimated: >60 mL/min (ref >60)
Glucose, Bld: 138 mg/dL — ABNORMAL HIGH (ref 70–99)
Phosphorus: 3.9 mg/dL (ref 2.5–4.6)
Potassium: 4.1 mmol/L (ref 3.5–5.1)
Sodium: 134 mmol/L — ABNORMAL LOW (ref 135–145)

## 2020-09-06 LAB — CBC
HCT: 27.5 % — ABNORMAL LOW (ref 39.0–52.0)
Hemoglobin: 8.4 g/dL — ABNORMAL LOW (ref 13.0–17.0)
MCH: 24.3 pg — ABNORMAL LOW (ref 26.0–34.0)
MCHC: 30.5 g/dL (ref 30.0–36.0)
MCV: 79.5 fL — ABNORMAL LOW (ref 80.0–100.0)
Platelets: 281 10*3/uL (ref 150–400)
RBC: 3.46 MIL/uL — ABNORMAL LOW (ref 4.22–5.81)
RDW: 22.9 % — ABNORMAL HIGH (ref 11.5–15.5)
WBC: 7.7 10*3/uL (ref 4.0–10.5)
nRBC: 1 % — ABNORMAL HIGH (ref 0.0–0.2)

## 2020-09-06 LAB — GLUCOSE, CAPILLARY
Glucose-Capillary: 143 mg/dL — ABNORMAL HIGH (ref 70–99)
Glucose-Capillary: 179 mg/dL — ABNORMAL HIGH (ref 70–99)
Glucose-Capillary: 119 mg/dL — ABNORMAL HIGH (ref 70–99)
Glucose-Capillary: 103 mg/dL — ABNORMAL HIGH (ref 70–99)

## 2020-09-06 LAB — MAGNESIUM: Magnesium: 2.2 mg/dL (ref 1.7–2.4)

## 2020-09-06 MED ORDER — METOCLOPRAMIDE HCL 5 MG/ML IJ SOLN
5.0000 mg | Freq: Three times a day (TID) | INTRAMUSCULAR | Status: DC | PRN
  Administered 2020-09-09: 5 mg via INTRAVENOUS
  Filled 2020-09-06: qty 2

## 2020-09-06 MED ORDER — SODIUM CHLORIDE 0.9 % IV SOLN: INTRAVENOUS | Status: DC

## 2020-09-06 MED ORDER — PEG 3350-KCL-NA BICARB-NACL 420 G PO SOLR
4000.0000 mL | Freq: Once | ORAL | Status: AC
  Administered 2020-09-06: 4000 mL via ORAL

## 2020-09-06 NOTE — Progress Notes (Signed)
Physical Therapy Treatment Patient Details Name: [name] MRN: [id number] DOB: Jun 28, 1961 Today's Date: 09/06/2020    History of Present Illness 59 yo male admitted to [facility] on 9/24 with weakness, joint pain, difficulty urinating. Pt meeting SIRS criteria, possibly secondary to urinary infection. NGT placed 10/1 for ileus. Now clamped. PMHx significant for chronic gouty arthritis of multiple joints, CKD II, IDDM, HTN, HLD, and chronic anemia. Admitted to [facility] 8/11-8/24 for AKI, LUE DVT, and joint pain, d/c SNF.    PT Comments    Patient is making gains in ambulation. Patient's HR max noted 123. Patient with increased distances today. Continue PT./  Follow Up Recommendations  SNF     Equipment Recommendations  None recommended by PT    Recommendations for Other Services       Precautions / Restrictions Precautions Precautions: Fall Precaution Comments: multiple lines, watch HR    Mobility  Bed Mobility               General bed mobility comments: OOB in recliner  Transfers   Equipment used: Rolling walker (2 wheeled) Transfers: Sit to/from Stand Sit to Stand: Min guard         General transfer comment: Min guard. Cues for safety, hand placement.  Ambulation/Gait Ambulation/Gait assistance: Min guard;+2 safety/equipment Gait Distance (Feet): 84 Feet (then 60) Assistive device: Rolling walker (2 wheeled) Gait Pattern/deviations: Step-through pattern;Decreased stride length Gait velocity: decreased   General Gait Details: recliner followed for safety. max HR 123   Stairs             Wheelchair Mobility    Modified Rankin (Stroke Patients Only)       Balance   Sitting-balance support: Feet supported;No upper extremity supported Sitting balance-Leahy Scale: Good Sitting balance - Comments: Sat at edge of chair without upper extremity support for urinal use.   Standing balance support: Bilateral upper extremity supported Standing  balance-Leahy Scale: Fair Standing balance comment: with RW, atnad secure                            Cognition Arousal/Alertness: Awake/alert Behavior During Therapy: Flat affect                                   General Comments: slow responses, more amenable to progression and need for reahb      Exercises      General Comments        Pertinent Vitals/Pain Pain Assessment: No/denies pain    Home Living                      Prior Function            PT Goals (current goals can now be found in the care plan section) Progress towards PT goals: Progressing toward goals    Frequency    Min 2X/week      PT Plan Current plan remains appropriate    Co-evaluation              AM-PAC PT "6 Clicks" Mobility   Outcome Measure  Help needed turning from your back to your side while in a flat bed without using bedrails?: A Little Help needed moving from lying on your back to sitting on the side of a flat bed without using bedrails?: A Little Help needed moving to and from a  bed to a chair (including a wheelchair)?: A Little Help needed standing up from a chair using your arms (e.g., wheelchair or bedside chair)?: A Little Help needed to walk in hospital room?: A Little Help needed climbing 3-5 steps with a railing? : A Lot 6 Click Score: 17    End of Session Equipment Utilized During Treatment: Gait belt Activity Tolerance: Patient tolerated treatment well Patient left: in chair;with call bell/phone within reach;with chair alarm set Nurse Communication: Mobility status PT Visit Diagnosis: Muscle weakness (generalized) (M62.81);Difficulty in walking, not elsewhere classified (R26.2)     Time: 3976-7341 PT Time Calculation (min) (ACUTE ONLY): 24 min  Charges:  $Gait Training: 23-37 mins                     [place] Pager [phone] Office [phone]    Claretha Cooper 09/06/2020, 2:30 PM

## 2020-09-06 NOTE — Progress Notes (Signed)
PROGRESS NOTE   [name]  CWC:[id number]    DOB: 02/24/1961    DOA: 08/10/2020  PCP: Charlott Rakes, MD   I have briefly reviewed patients previous medical records in [facility].  Chief Complaint  Patient presents with  . Leg Swelling  . Medical Clearance    rehab placement     Brief Narrative:  59 year old male with PMH of polyarticular gout, IDDM/type II DM, iron deficiency anemia, HTN, recent hospitalization 8/11-8/24 for nontraumatic/possibly statin related rhabdomyolysis with AKI, found to have LUE DVT, discharged to SNF, presented with complaints of generalized weakness, malaise, fevers, joint pains, and noted to have abdominal distention with nausea and vomiting.  Admitted for sepsis, UTI, bowel obstruction/ileus.  General surgery and GI consulted.   Assessment & Plan:  Principal Problem:   Ileus ([place]) Active Problems:   Essential hypertension   Gout   Iron deficiency anemia   Elevated troponin   Generalized weakness   Uncontrolled type 2 diabetes mellitus with hyperglycemia, without long-term current use of insulin (HCC)   Acute deep vein thrombosis (DVT) of brachial vein of left upper extremity (HCC)   Hypokalemia   CKD (chronic kidney disease) stage 2, GFR 60-89 ml/min   Polyarticular gout   Nasogastric tube present   Acidosis, metabolic   Sepsis due to Escherichia coli without acute organ dysfunction (HCC)   Colonic and small 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  Has had multiple imaging studies to evaluate/follow same.  General surgery and GI consulted.  S/p NG tube and neostigmine x1 dose.  S/p PICC line and TPN initiation.  General surgery signed off 08/22/2020.  Diet finally advanced to regular consistency which he tolerated and TPN was discontinued on 09/06/2020.  GI prepping for EGD  and colonoscopy on 09/07/2020.  Polyarticular gout with osteoarthritis/acute gouty arthritis:  Patient had previous work-up with rheumatoid factor and ANA which were negative.  Steroids and colchicine held on admission.  Allopurinol discontinued on admission.  He had acute gouty flare of right hand on 10/13 for which he completed treatment with IV Solu-Medrol.  Outpatient follow-up with rheumatology.  Type II DM/IDDM with neuropathy/nephropathy  A1c 7.1.  Reasonably controlled on current low-dose Lantus and SSI.  Monitor and adjust as needed.  Hypokalemia/hypomagnesemia  Replaced.  Follow periodically.  Left upper extremity DVT  Was on apixaban twice daily.  Was on IV heparin which was changed to full dose Lovenox and can be transitioned back to apixaban after EGD and colonoscopy when cleared by GI.  Iron deficiency anemia  S/p IV Feraheme 08/12/2020.  Plan for EGD and colonoscopy on 10/21  Hemoglobin stable in the mid 8 g range.  Resume iron supplements at discharge.  Sepsis secondary to E. coli UTI, POA then E. coli/Pseudomonas UTI.  Initial urine cultures showed E. coli, started on Keflex subsequently transitioned to IV Ancef while n.p.o. and finished course 08/19/2020.  Noted fevers 08/26/2020, started on IV ceftriaxone, urine cultures positive for E. coli and Pseudomonas aeruginosa, completed course of IV cefepime.  Sepsis resolved.  Prior bilateral knee surgeries  SNF when medically stable.  Stage II chronic kidney disease  Stable  Metabolic acidosis:  Initial concern for abdominal ischemia as patient with significant colonic ileus with no significant improvement early on in admission.  S/p bicarbonate drip.  Resolved.  Nutrition  Due to patient's colonic distention/ileus patient had been n.p.o. for several  days early on in admission.  S/p NG tube and rectal tube placement-both discontinued.  PICC line placed, was on TPN which was stopped 10/21.   Tolerating regular diet, currently on hold for bowel prep.  Severe onychogryphosis  QT prolongation  Improved.  EKG 08/22/2020: QTC 483 ms.  Monitor EKG periodically and avoid QT prolonging medications.  Essential hypertension:  Controlled.  Body mass index is 38.02 kg/m.  Nutritional Status Nutrition Problem: Inadequate oral intake Etiology: acute illness Signs/Symptoms: other (comment) (FLD unable to provide adequate needs) Interventions: TPN, Other (Comment) [name] 1.4 po; Prosource Plus)  DVT prophylaxis:   Currently on full dose Lovenox.   Code Status: Full Code Family Communication: None at bedside Disposition:  Status is: Inpatient  Remains inpatient appropriate because:Inpatient level of care appropriate due to severity of illness   Dispo:  Patient From:    Planned Disposition: [place]  Expected discharge date: 09/08/20  Medically stable for discharge:           Consultants:   Gastroenterology General surgery  Procedures:   As noted above.  Antimicrobials:    Anti-infectives (From admission, onward)   Start     Dose/Rate Route Frequency Ordered Stop   08/30/20 1100  ceFEPIme (MAXIPIME) 2 g in sodium chloride 0.9 % 100 mL IVPB        2 g 200 mL/hr over 30 Minutes Intravenous Every 12 hours 08/30/20 1003 09/05/20 2240   08/29/20 1030  azithromycin (ZITHROMAX) 500 mg in sodium chloride 0.9 % 250 mL IVPB        500 mg 250 mL/hr over 60 Minutes Intravenous Every 24 hours 08/29/20 0940 09/01/20 1405   08/26/20 2300  cefTRIAXone (ROCEPHIN) 1 g in sodium chloride 0.9 % 100 mL IVPB  Status:  Discontinued        1 g 200 mL/hr over 30 Minutes Intravenous Every 24 hours 08/26/20 2209 08/30/20 0956   08/15/20 1600  ceFAZolin (ANCEF) IVPB 1 g/50 mL premix        1 g 100 mL/hr over 30 Minutes Intravenous Every 8 hours 08/15/20 1429 08/19/20 2243   08/14/20 1730  cefTRIAXone (ROCEPHIN) 1 g in sodium chloride 0.9 % 100 mL IVPB  Status:   Discontinued        1 g 200 mL/hr over 30 Minutes Intravenous Every 24 hours 08/14/20 1647 08/15/20 1429   08/12/20 1230  cephALEXin (KEFLEX) capsule 500 mg  Status:  Discontinued        500 mg Oral Every 12 hours 08/12/20 1139 08/14/20 1647        Subjective:  Reports some bilateral upper extremity swelling without significant pain.  Tolerating diet.  Denies nausea, vomiting, diarrhea or bleeding or melena.  As per RN, no acute issues noted.  Objective:   Vitals:   09/05/20 1500 09/05/20 2047 09/06/20 1200 09/06/20 1418  BP:  135/85 131/89 127/82  Pulse:  76 79 80  Resp:  18 14 16   Temp: 97.9 F (36.6 C) 98 F (36.7 C)  98.1 F (36.7 C)  TempSrc: Oral Oral  Oral  SpO2:  98% 99% 100%  Weight:      Height:        General exam: Pleasant middle-age male, moderately built and obese sitting up comfortably in chair this morning. Respiratory system: Clear to auscultation. Respiratory effort normal. Cardiovascular system: S1 & S2 heard, RRR. No JVD, murmurs, rubs, gallops or clicks.  Trace bilateral leg and distal upper extremity edema.  Telemetry personally reviewed: Sinus rhythm. Gastrointestinal system: Abdomen is nondistended/obese, soft and nontender. No organomegaly or masses felt. Normal bowel sounds heard. Central nervous system: Alert and oriented. No focal neurological deficits. Extremities: Symmetric 5 x 5 power. Skin: No rashes, lesions or ulcers Psychiatry: Judgement and insight appear normal. Mood & affect appropriate.     Data Reviewed:   I have personally reviewed following labs and imaging studies   CBC: Recent Labs  Lab 09/04/20 0408 09/05/20 0506 09/06/20 0415  WBC 12.9* 9.5 7.7  NEUTROABS 7.8*  --   --   HGB 8.4* 8.6* 8.4*  HCT 27.0* 28.4* 27.5*  MCV 77.8* 78.9* 79.5*  PLT 302 306 563    Basic Metabolic Panel: Recent Labs  Lab 09/04/20 0408 09/05/20 0506 09/06/20 0415  NA 139 135 134*  K 4.0 4.1 4.1  CL 99 95* 97*  CO2 29 30 30   GLUCOSE  175* 156* 138*  BUN 26* 21* 18  CREATININE 0.68 0.67 0.66  CALCIUM 8.3* 8.2* 8.2*  MG 1.9 1.7 2.2  PHOS 3.8 3.0 3.9    Liver Function Tests: Recent Labs  Lab 08/31/20 0439 09/04/20 0408 09/06/20 0415  AST 40 42*  --   ALT 56* 151*  --   ALKPHOS 119 87  --   BILITOT 0.5 0.6  --   PROT 6.3* 5.6*  --   ALBUMIN 2.3* 2.3* 2.5*    CBG: Recent Labs  Lab 09/05/20 2051 09/06/20 0759 09/06/20 1145  GLUCAP 113* 143* 179*    Microbiology Studies:   Recent Results (from the past 240 hour(s))  Culture, blood (Routine X 2) w Reflex to ID Panel     Status: None   Collection Time: 08/31/20 10:31 AM   Specimen: BLOOD  Result Value Ref Range Status   Specimen Description   Final    BLOOD LEFT FOOT Performed at [facility], [place] [address], [place], [place] [address]    Special Requests   Final    BOTTLES DRAWN AEROBIC AND ANAEROBIC Blood Culture adequate volume Performed at [facility] [address] [place], [place] [address]    Culture   Final    NO GROWTH 5 DAYS Performed at [facility], [place] [address], [place], [place] [address]    Report Status 09/05/2020 FINAL  Final  Culture, blood (Routine X 2) w Reflex to ID Panel     Status: None   Collection Time: 08/31/20 10:46 AM   Specimen: BLOOD  Result Value Ref Range Status   Specimen Description   Final    BLOOD RIGHT FOOT Performed at [facility] [address], [place], [place] [address]    Special Requests   Final    BOTTLES DRAWN AEROBIC AND ANAEROBIC Blood Culture adequate volume Performed at [facility] [address] [place], [place] [address]    Culture   Final    NO GROWTH 5 DAYS Performed at [facility], [place] [address], [place], [place] [address]    Report Status 09/05/2020 FINAL  Final     Radiology Studies:  No results found.   Scheduled Meds:   . (feeding supplement) PROSource Plus  30 mL Oral BID  BM  . amLODipine  10 mg Oral Daily  . Chlorhexidine Gluconate Cloth  6 each Topical Daily  . enoxaparin (LOVENOX) injection  100 mg Subcutaneous Q12H  . feeding supplement ([employer] STANDARD 1.4)  325 mL Oral  Daily  . insulin aspart  0-15 Units Subcutaneous TID WC  . insulin aspart  0-5 Units Subcutaneous QHS  . insulin glargine  7 Units Subcutaneous QHS  . metoCLOPramide (REGLAN) injection  5 mg Intravenous Q8H  . metoprolol succinate  75 mg Oral Daily  . multivitamin with minerals  1 tablet Oral Daily  . pantoprazole  40 mg Oral Q0600  . polyethylene glycol  17 g Oral Daily  . polyethylene glycol-electrolytes  4,000 mL Oral Once  . sodium chloride flush  10-40 mL Intracatheter Q12H    Continuous Infusions:   . sodium chloride 10 mL/hr at 09/05/20 1708  . sodium chloride       LOS: 25 days     Vernell Leep, MD, [place], [facility]. [facility]    To contact the attending provider between 7A-7P or the covering provider during after hours 7P-7A, please log into the web site [email or web address] and access using universal [place] password for that web site. If you do not have the password, please call the hospital operator.  09/06/2020, 2:48 PM

## 2020-09-06 NOTE — Plan of Care (Signed)
  Problem: Health Behavior/Discharge Planning: Goal: Ability to manage health-related needs will improve Outcome: Progressing   Problem: Clinical Measurements: Goal: Ability to maintain clinical measurements within normal limits will improve Outcome: Progressing Goal: Diagnostic test results will improve Outcome: Progressing Goal: Cardiovascular complication will be avoided Outcome: Progressing   Problem: Activity: Goal: Risk for activity intolerance will decrease Outcome: Progressing   Problem: Nutrition: Goal: Adequate nutrition will be maintained Outcome: Progressing   Problem: Pain Managment: Goal: General experience of comfort will improve Outcome: Progressing   Problem: Safety: Goal: Ability to remain free from injury will improve Outcome: Progressing   Problem: Skin Integrity: Goal: Risk for impaired skin integrity will decrease Outcome: Progressing

## 2020-09-07 ENCOUNTER — Inpatient Hospital Stay (HOSPITAL_COMMUNITY): Payer: Medicare HMO | Admitting: Anesthesiology

## 2020-09-07 ENCOUNTER — Encounter (HOSPITAL_COMMUNITY)
Admission: EM | Disposition: A | Discharge status: Skilled Nursing Facility | Payer: Self-pay | Source: Home / Self Care | Attending: Internal Medicine

## 2020-09-07 ENCOUNTER — Encounter (HOSPITAL_COMMUNITY): Payer: Self-pay | Admitting: Internal Medicine

## 2020-09-07 DIAGNOSIS — K567 Ileus, unspecified: Secondary | ICD-10-CM | POA: Diagnosis not present

## 2020-09-07 HISTORY — PX: COLONOSCOPY WITH PROPOFOL: SHX5780

## 2020-09-07 HISTORY — PX: ESOPHAGOGASTRODUODENOSCOPY (EGD) WITH PROPOFOL: SHX5813

## 2020-09-07 LAB — COMPREHENSIVE METABOLIC PANEL
ALT: 101 U/L — ABNORMAL HIGH (ref 0–44)
AST: 44 U/L — ABNORMAL HIGH (ref 15–41)
Albumin: 2.6 g/dL — ABNORMAL LOW (ref 3.5–5.0)
Alkaline Phosphatase: 84 U/L (ref 38–126)
Anion gap: 9 (ref 5–15)
BUN: 13 mg/dL (ref 6–20)
CO2: 28 mmol/L (ref 22–32)
Calcium: 8.2 mg/dL — ABNORMAL LOW (ref 8.9–10.3)
Chloride: 99 mmol/L (ref 98–111)
Creatinine, Ser: 0.64 mg/dL (ref 0.61–1.24)
GFR, Estimated: >60 mL/min (ref >60)
Glucose, Bld: 114 mg/dL — ABNORMAL HIGH (ref 70–99)
Potassium: 3.7 mmol/L (ref 3.5–5.1)
Sodium: 136 mmol/L (ref 135–145)
Total Bilirubin: 0.7 mg/dL (ref 0.3–1.2)
Total Protein: 5.8 g/dL — ABNORMAL LOW (ref 6.5–8.1)

## 2020-09-07 LAB — CBC
HCT: 28.4 % — ABNORMAL LOW (ref 39.0–52.0)
Hemoglobin: 8.7 g/dL — ABNORMAL LOW (ref 13.0–17.0)
MCH: 24.6 pg — ABNORMAL LOW (ref 26.0–34.0)
MCHC: 30.6 g/dL (ref 30.0–36.0)
MCV: 80.5 fL (ref 80.0–100.0)
Platelets: 264 10*3/uL (ref 150–400)
RBC: 3.53 MIL/uL — ABNORMAL LOW (ref 4.22–5.81)
RDW: 23.8 % — ABNORMAL HIGH (ref 11.5–15.5)
WBC: 6.8 10*3/uL (ref 4.0–10.5)
nRBC: 0.4 % — ABNORMAL HIGH (ref 0.0–0.2)

## 2020-09-07 LAB — GLUCOSE, CAPILLARY
Glucose-Capillary: 111 mg/dL — ABNORMAL HIGH (ref 70–99)
Glucose-Capillary: 104 mg/dL — ABNORMAL HIGH (ref 70–99)
Glucose-Capillary: 186 mg/dL — ABNORMAL HIGH (ref 70–99)
Glucose-Capillary: 163 mg/dL — ABNORMAL HIGH (ref 70–99)

## 2020-09-07 LAB — MAGNESIUM: Magnesium: 1.8 mg/dL (ref 1.7–2.4)

## 2020-09-07 SURGERY — ESOPHAGOGASTRODUODENOSCOPY (EGD) WITH PROPOFOL
Anesthesia: Monitor Anesthesia Care

## 2020-09-07 MED ORDER — PROPOFOL 500 MG/50ML IV EMUL: INTRAVENOUS | Status: DC | PRN

## 2020-09-07 MED ORDER — ALBUTEROL SULFATE HFA 108 (90 BASE) MCG/ACT IN AERS
INHALATION_SPRAY | RESPIRATORY_TRACT | Status: DC | PRN
  Administered 2020-09-07: 4 via RESPIRATORY_TRACT

## 2020-09-07 MED ORDER — LACTATED RINGERS IV SOLN: INTRAVENOUS | Status: DC

## 2020-09-07 MED ORDER — PROPOFOL 500 MG/50ML IV EMUL
INTRAVENOUS | Status: DC | PRN
  Administered 2020-09-07: 75 ug/kg/min via INTRAVENOUS

## 2020-09-07 MED ORDER — PROPOFOL 10 MG/ML IV BOLUS
INTRAVENOUS | Status: DC | PRN
  Administered 2020-09-07 (×2): 20 mg via INTRAVENOUS
  Administered 2020-09-07: 50 mg via INTRAVENOUS

## 2020-09-07 MED ORDER — LIDOCAINE 2% (20 MG/ML) 5 ML SYRINGE
INTRAMUSCULAR | Status: DC | PRN
  Administered 2020-09-07 (×2): 40 mg via INTRAVENOUS

## 2020-09-07 SURGICAL SUPPLY — 25 items

## 2020-09-07 NOTE — Anesthesia Postprocedure Evaluation (Signed)
Anesthesia Post Note  Patient: [name]  Procedure(s) Performed: ESOPHAGOGASTRODUODENOSCOPY (EGD) WITH PROPOFOL (N/A ) COLONOSCOPY WITH PROPOFOL (N/A )     Patient location during evaluation: PACU Anesthesia Type: MAC Level of consciousness: awake and alert Pain management: pain level controlled Vital Signs Assessment: post-procedure vital signs reviewed and stable Respiratory status: spontaneous breathing, nonlabored ventilation and respiratory function stable Cardiovascular status: blood pressure returned to baseline and stable Postop Assessment: no apparent nausea or vomiting Anesthetic complications: no   No complications documented.  Last Vitals:  Vitals:   09/07/20 1310 09/07/20 1322  BP: (!) 182/82 (!) 184/96  Pulse: 84 83  Resp: 11 14  Temp:    SpO2: 95% 92%    Last Pain:  Vitals:   09/07/20 1322  TempSrc:   PainSc: 0-No pain                 Pervis Hocking

## 2020-09-07 NOTE — Op Note (Signed)
[facility] Patient Name: [name] Procedure Date: 09/07/2020 MRN: [id number] Attending MD: Carol Ada , MD Date of Birth: 1960-12-03 CSN: [id number] Age: 59 Admit Type: Outpatient Procedure:                Colonoscopy Indications:              Heme positive stool, Iron deficiency anemia Providers:                Carol Ada, MD, Cleda Daub, RN, Faustina                            Mbumina, Technician, Cherylynn Ridges, Technician Referring MD:              Medicines:                 Complications:            No immediate complications. Estimated Blood Loss:     Estimated blood loss: none. Procedure:                Pre-Anesthesia Assessment:                           - Prior to the procedure, a History and Physical                            was performed, and patient medications and                            allergies were reviewed. The patient's tolerance of                            previous anesthesia was also reviewed. The risks                            and benefits of the procedure and the sedation                            options and risks were discussed with the patient.                            All questions were answered, and informed consent                            was obtained. Prior Anticoagulants: The patient has                            taken no previous anticoagulant or antiplatelet                            agents. ASA Grade Assessment: III - A patient with                            severe systemic disease. After reviewing the risks  and benefits, the patient was deemed in                            satisfactory condition to undergo the procedure.                           - Sedation was administered by an anesthesia                            professional. Deep sedation was attained.                           After obtaining informed consent, the colonoscope                            was passed under  direct vision. Throughout the                            procedure, the patient's blood pressure, pulse, and                            oxygen saturations were monitored continuously. The                            [id number]-[id number] ([id number] Olympus pediatric colonscope                            was introduced through the anus and advanced to the                            the cecum, identified by appendiceal orifice and                            ileocecal valve. The colonoscopy was performed with                            difficulty due to significant looping. Successful                            completion of the procedure was aided by using                            manual pressure and straightening and shortening                            the scope to obtain bowel loop reduction. The                            patient tolerated the procedure well. The quality                            of the bowel preparation was good. The ileocecal  valve, appendiceal orifice, and rectum were                            photographed. Scope In: 12:25:21 PM Scope Out: 89:38:10 PM Scope Withdrawal Time: 0 hours 5 minutes 49 seconds  Total Procedure Duration: 0 hours 21 minutes 58 seconds  Findings:      The entire examined colon appeared normal. Impression:               - The entire examined colon is normal.                           - No specimens collected. Moderate Sedation:      Not Applicable - Patient had care per Anesthesia. Recommendation:           - Return patient to hospital ward for ongoing care.                           - Diabetic (ADA) diet.                           - Continue present medications.                           - Repeat colonoscopy in 10 years for screening                            purposes. Procedure Code(s):        --- Professional ---                           [phone], Colonoscopy, flexible; diagnostic, including                             collection of specimen(s) by brushing or washing,                            when performed (separate procedure) Diagnosis Code(s):        --- Professional ---                           R19.5, Other fecal abnormalities                           D50.9, Iron deficiency anemia, unspecified CPT copyright 2019 American Medical Association. All rights reserved. The codes documented in this report are preliminary and upon coder review may  be revised to meet current compliance requirements. Carol Ada, MD Carol Ada, MD 09/07/2020 12:51:09 PM This report has been signed electronically. Number of Addenda: 0

## 2020-09-07 NOTE — Anesthesia Preprocedure Evaluation (Addendum)
Anesthesia Evaluation  Patient identified by MRN, date of birth, ID band Patient awake    Reviewed: Allergy & Precautions, NPO status , Patient's Chart, lab work & pertinent test results  Airway Mallampati: II  TM Distance: >3 FB Neck ROM: Full    Dental  (+) Poor Dentition, Dental Advisory Given, Missing,    Pulmonary neg pulmonary ROS,    Pulmonary exam normal breath sounds clear to auscultation       Cardiovascular hypertension, Pt. on medications and Pt. on home beta blockers + DVT  Normal cardiovascular exam Rhythm:Regular Rate:Normal  Echo 07/2020: 1. Very difficult study. Globally, LV function is normal ~65-70%. RV  function appears normal.  2. Left ventricular ejection fraction, by estimation, is 65 to 70%. The  left ventricle has normal function. Left ventricular endocardial border  not optimally defined to evaluate regional wall motion. Indeterminate  diastolic filling due to E-A fusion.  3. Right ventricular systolic function is normal. The right ventricular  size is normal. Tricuspid regurgitation signal is inadequate for assessing  PA pressure.  4. The mitral valve is grossly normal. Trivial mitral valve  regurgitation. No evidence of mitral stenosis.  5. The aortic valve is grossly normal. Aortic valve regurgitation is not  visualized. No aortic stenosis is present.    Neuro/Psych negative neurological ROS  negative psych ROS   GI/Hepatic Neg liver ROS, Anemia, heme positive stool   Endo/Other  diabetes, Well Controlled, Type 2, Insulin DependentHypothyroidism Obesity BMI 38 FS in preop 105  Renal/GU negative Renal ROS     Musculoskeletal  (+) Arthritis , Osteoarthritis,    Abdominal (+) + obese,   Peds  Hematology  (+) Blood dyscrasia, anemia , H/H 8.7/28.4   Anesthesia Other Findings recent hospitalization 8/11-8/24 for nontraumatic/possibly statin related rhabdomyolysis with AKI, found to  have LUE DVT, discharged to nursing facility on eliquis for DVT- presented back to [facility] with abdominal pain/N/V/GIB  Reproductive/Obstetrics negative OB ROS                           Anesthesia Physical Anesthesia Plan  ASA: III  Anesthesia Plan: MAC   Post-op Pain Management:    Induction:   PONV Risk Score and Plan: 2 and Propofol infusion and TIVA  Airway Management Planned: Natural Airway and Simple Face Mask  Additional Equipment: None  Intra-op Plan:   Post-operative Plan:   Informed Consent: I have reviewed the patients History and Physical, chart, labs and discussed the procedure including the risks, benefits and alternatives for the proposed anesthesia with the patient or authorized representative who has indicated his/her understanding and acceptance.       Plan Discussed with: CRNA  Anesthesia Plan Comments:        Anesthesia Quick Evaluation

## 2020-09-07 NOTE — TOC Progression Note (Addendum)
Transition of Care [facility]) - Progression Note    Patient Details  Name: [name] MRN: [id number] Date of Birth: 1961/11/16  Transition of Care [facility]) CM/SW Contact  Townes Fuhs, Juliann Pulse, RN Phone Number: 09/07/2020, 12:16 PM  Clinical Narrative: d/c plan [employer]-SNF-initiated insurance auth-faxed info to insurance-await auth-ref#[id number]. Will need covid ordered.  4:30p-Just notified that patient is not [place] insurance-patient has [employer] [id number];ASU#[phone] for clinical review-pending WLKH#[id number].    Expected Discharge Plan: Skilled Nursing Facility Barriers to Discharge: Ship broker  Expected Discharge Plan and Services Expected Discharge Plan: [place]   Discharge Planning Services: CM Consult Post Acute Care Choice: [place] Living arrangements for the past 2 months: Single Family Home                                       Social Determinants of Health (SDOH) Interventions    Readmission Risk Interventions No flowsheet data found.

## 2020-09-07 NOTE — Transfer of Care (Signed)
Immediate Anesthesia Transfer of Care Note  Patient: [name]  Procedure(s) Performed: ESOPHAGOGASTRODUODENOSCOPY (EGD) WITH PROPOFOL (N/A ) COLONOSCOPY WITH PROPOFOL (N/A )  Patient Location: PACU and Endoscopy Unit  Anesthesia Type:MAC  Level of Consciousness: awake and alert   Airway & Oxygen Therapy: Patient Spontanous Breathing and Patient connected to face mask oxygen  Post-op Assessment: Report given to RN and Post -op Vital signs reviewed and stable  Post vital signs: Reviewed and stable  Last Vitals:  Vitals Value Taken Time  BP 187/87 09/07/20 1300  Temp 37.1 C 09/07/20 1257  Pulse 92 09/07/20 1301  Resp 15 09/07/20 1301  SpO2 98 % 09/07/20 1301  Vitals shown include unvalidated device data.  Last Pain:  Vitals:   09/07/20 1257  TempSrc: Oral  PainSc: 0-No pain      Patients Stated Pain Goal: 0 (46/65/99 3570)  Complications: No complications documented.

## 2020-09-07 NOTE — Interval H&P Note (Signed)
History and Physical Interval Note:  09/07/2020 11:57 AM  [name]  has presented today for surgery, with the diagnosis of Anemia and heme positive stool.  The various methods of treatment have been discussed with the patient and family. After consideration of risks, benefits and other options for treatment, the patient has consented to  Procedure(s): ESOPHAGOGASTRODUODENOSCOPY (EGD) WITH PROPOFOL (N/A) COLONOSCOPY WITH PROPOFOL (N/A) as a surgical intervention.  The patient's history has been reviewed, patient examined, no change in status, stable for surgery.  I have reviewed the patient's chart and labs.  Questions were answered to the patient's satisfaction.     Johnwesley Lederman D

## 2020-09-07 NOTE — Op Note (Signed)
[facility] Patient Name: [name] Procedure Date: 09/07/2020 MRN: [id number] Attending MD: Carol Ada , MD Date of Birth: 1961/08/18 CSN: [id number] Age: 59 Admit Type: Outpatient Procedure:                Upper GI endoscopy Indications:              Iron deficiency anemia Providers:                Carol Ada, MD, Cleda Daub, RN, Faustina                            Mbumina, Technician, Cherylynn Ridges, Technician Referring MD:              Medicines:                Propofol per Anesthesia Complications:            No immediate complications. Estimated Blood Loss:     Estimated blood loss: none. Procedure:                Pre-Anesthesia Assessment:                           - Prior to the procedure, a History and Physical                            was performed, and patient medications and                            allergies were reviewed. The patient's tolerance of                            previous anesthesia was also reviewed. The risks                            and benefits of the procedure and the sedation                            options and risks were discussed with the patient.                            All questions were answered, and informed consent                            was obtained. Prior Anticoagulants: The patient has                            taken no previous anticoagulant or antiplatelet                            agents. ASA Grade Assessment: III - A patient with                            severe systemic disease. After reviewing the risks  and benefits, the patient was deemed in                            satisfactory condition to undergo the procedure.                           - Sedation was administered by an anesthesia                            professional. Deep sedation was attained.                           After obtaining informed consent, the endoscope was                            passed  under direct vision. Throughout the                            procedure, the patient's blood pressure, pulse, and                            oxygen saturations were monitored continuously. The                            PCF-[id number] ([id number]) Olympus pediatric colonscope                            was introduced through the mouth, and advanced to                            the second part of duodenum. The upper GI endoscopy                            was accomplished without difficulty. The patient                            tolerated the procedure well. Scope In: Scope Out: Findings:      The esophagus was normal.      Mild portal hypertensive gastropathy was found in the gastric fundus and       in the gastric body.      The examined duodenum was normal. Impression:               - Normal esophagus.                           - Portal hypertensive gastropathy.                           - Normal examined duodenum.                           - No specimens collected. Moderate Sedation:      Not Applicable - Patient had care per Anesthesia. Recommendation:           - Patient has a contact number available for  emergencies. The signs and symptoms of potential                            delayed complications were discussed with the                            patient. Return to normal activities tomorrow.                            Written discharge instructions were provided to the                            patient.                           - Resume previous diet.                           - Continue present medications. Procedure Code(s):        --- Professional ---                           [phone], Esophagogastroduodenoscopy, flexible,                            transoral; diagnostic, including collection of                            specimen(s) by brushing or washing, when performed                            (separate procedure) Diagnosis Code(s):         --- Professional ---                           K76.6, Portal hypertension                           K31.89, Other diseases of stomach and duodenum                           D50.9, Iron deficiency anemia, unspecified CPT copyright 2019 American Medical Association. All rights reserved. The codes documented in this report are preliminary and upon coder review may  be revised to meet current compliance requirements. Carol Ada, MD Carol Ada, MD 09/07/2020 12:54:27 PM This report has been signed electronically. Number of Addenda: 0

## 2020-09-07 NOTE — Anesthesia Procedure Notes (Signed)
Procedure Name: MAC Date/Time: 09/07/2020 12:11 PM Performed by: Cynda Familia, CRNA Pre-anesthesia Checklist: Patient identified, Emergency Drugs available, Suction available, Timeout performed and Patient being monitored Patient Re-evaluated:Patient Re-evaluated prior to induction Oxygen Delivery Method: Simple face mask Placement Confirmation: positive ETCO2 and breath sounds checked- equal and bilateral Dental Injury: Teeth and Oropharynx as per pre-operative assessment  Comments: Bite block by RN

## 2020-09-07 NOTE — Progress Notes (Signed)
PROGRESS NOTE   [name]  JME:[id number]    DOB: 1961/04/03    DOA: 08/10/2020  PCP: Charlott Rakes, MD   I have briefly reviewed patients previous medical records in [facility].  Chief Complaint  Patient presents with  . Leg Swelling  . Medical Clearance    rehab placement     Brief Narrative:  59 year old male with PMH of polyarticular gout, IDDM/type II DM, iron deficiency anemia, HTN, recent hospitalization 8/11-8/24 for nontraumatic/possibly statin related rhabdomyolysis with AKI, found to have LUE DVT, discharged to SNF, presented with complaints of generalized weakness, malaise, fevers, joint pains, and noted to have abdominal distention with nausea and vomiting.  Admitted for sepsis, UTI, bowel obstruction/ileus.  General surgery and GI consulted.  S/p EGD and colonoscopy on 10/21.   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  Has had multiple imaging studies to evaluate/follow same.  General surgery and GI consulted.  S/p NG tube and neostigmine x1 dose.  S/p PICC line and TPN initiation.  General surgery signed off 08/22/2020.  Diet finally advanced to regular consistency which he tolerated and TPN was discontinued on  09/06/2020.  EGD showed portal hypertensive gastropathy and colonoscopy was normal on 09/07/2020.  Regular consistency diet has been resumed post procedure, monitor tolerance.  Polyarticular gout with osteoarthritis/acute gouty arthritis:  Patient had previous work-up with rheumatoid factor and ANA which were negative.  Steroids and colchicine held on admission.  Allopurinol discontinued on admission.  He had acute gouty flare of right hand on 10/13 for which he completed treatment with IV Solu-Medrol.  Outpatient follow-up with rheumatology.  Type II DM/IDDM with neuropathy/nephropathy  A1c 7.1.  Reasonably controlled on current low-dose Lantus and SSI.  Monitor and adjust as needed.  Hypokalemia/hypomagnesemia  Replaced.  Follow periodically.  Left upper extremity DVT  Was on apixaban twice daily.  Was on IV heparin which was changed to full dose Lovenox and can be transitioned back to apixaban after EGD and colonoscopy when cleared by GI.  Resumed on full dose Lovenox this p.m. after discussion with GI.  Can resume apixaban from tomorrow.  Iron deficiency anemia  S/p IV Feraheme 08/12/2020.  EGD and colonoscopy 10/21 without significant findings to explain anemia.  Hemoglobin stable in the mid 8 g range.  Resume iron supplements at discharge.  Sepsis secondary to E. coli UTI, POA then E. coli/Pseudomonas UTI.  Initial urine cultures showed E. coli, started on Keflex subsequently transitioned to IV Ancef while n.p.o. and finished course 08/19/2020.  Noted fevers 08/26/2020, started on IV ceftriaxone, urine cultures positive for E. coli and Pseudomonas aeruginosa, completed course of IV cefepime.  Sepsis resolved.  Prior bilateral knee surgeries  SNF when medically stable.  Stage II chronic kidney disease  Stable  Metabolic acidosis:  Initial concern for abdominal ischemia as patient with significant colonic ileus with no significant improvement early on in  admission.  S/p bicarbonate drip.  Resolved.  Nutrition  Due to patient's colonic distention/ileus patient had been n.p.o. for several days early on in admission.  S/p NG tube and rectal tube placement-both discontinued.  PICC line placed, was on TPN which was stopped 10/21.  Tolerating regular diet.  Severe onychogryphosis  QT prolongation  Improved.  EKG 08/22/2020: QTC 483 ms.  Monitor EKG periodically and avoid QT prolonging medications.  Essential hypertension:  Mildly uncontrolled as below.  Received his amlodipine and metoprolol a little late this morning, may be related to procedure timing.  As needed IV hydralazine.  Abnormal LFTs:  Mild and improving.  Follow LFTs as outpatient.  Body mass index is 38.02 kg/m.  Nutritional Status Nutrition Problem: Inadequate oral intake Etiology: acute illness Signs/Symptoms: other (comment) (FLD unable to provide adequate needs) Interventions: TPN, Other (Comment) [name] 1.4 po; Prosource Plus)  DVT prophylaxis:   Currently on full dose Lovenox.   Code Status: Full Code Family Communication: None at bedside Disposition:  Status is: Inpatient  Remains inpatient appropriate because:Inpatient level of care appropriate due to severity of illness   Dispo:  Patient From:    Planned Disposition: [place]  Expected discharge date: 09/08/20  Medically stable for discharge:  No         Consultants:   Gastroenterology General surgery  Procedures:   As noted above.  EGD 09/07/2020:  Impression: - Normal esophagus. - Portal hypertensive gastropathy. - Normal examined duodenum. - No specimens collected.  Colonoscopy 09/07/2020:  Impression: - The entire examined colon is normal. - No specimens collected.  Antimicrobials:    Anti-infectives (From admission, onward)   Start     Dose/Rate Route Frequency Ordered Stop   08/30/20 1100  ceFEPIme (MAXIPIME) 2 g in sodium chloride 0.9 % 100 mL  IVPB        2 g 200 mL/hr over 30 Minutes Intravenous Every 12 hours 08/30/20 1003 09/05/20 2240   08/29/20 1030  azithromycin (ZITHROMAX) 500 mg in sodium chloride 0.9 % 250 mL IVPB        500 mg 250 mL/hr over 60 Minutes Intravenous Every 24 hours 08/29/20 0940 09/01/20 1405   08/26/20 2300  cefTRIAXone (ROCEPHIN) 1 g in sodium chloride 0.9 % 100 mL IVPB  Status:  Discontinued        1 g 200 mL/hr over 30 Minutes Intravenous Every 24 hours 08/26/20 2209 08/30/20 0956   08/15/20 1600  ceFAZolin (ANCEF) IVPB 1 g/50 mL premix        1 g 100 mL/hr over 30 Minutes Intravenous Every 8 hours 08/15/20 1429 08/19/20 2243   08/14/20 1730  cefTRIAXone (ROCEPHIN) 1 g in sodium chloride 0.9 % 100 mL IVPB  Status:  Discontinued        1 g 200 mL/hr over 30 Minutes Intravenous Every 24 hours 08/14/20 1647 08/15/20 1429   08/12/20 1230  cephALEXin (KEFLEX) capsule 500 mg  Status:  Discontinued        500 mg Oral Every 12 hours 08/12/20 1139 08/14/20 1647        Subjective:  Patient seen this morning prior to procedures.  Seemed a little frustrated by the diarrhea from bowel prep but reported no bleeding or black-colored stools.  Objective:   Vitals:   09/07/20 1257 09/07/20 1310 09/07/20 1322 09/07/20 1344  BP: [place] Kitchen)  187/70 (!) 182/82 (!) 184/96 (!) 166/106  Pulse: 91 84 83 62  Resp: (!) 22 11 14 14   Temp: 98.7 F (37.1 C)   97.7 F (36.5 C)  TempSrc: Oral     SpO2: 96% 95% 92% 95%  Weight:      Height:        General exam: Pleasant middle-age male, moderately built and obese sitting up comfortably in bed this morning Respiratory system: Clear to auscultation. Respiratory effort normal. Cardiovascular system: S1 & S2 heard, RRR. No JVD, murmurs, rubs, gallops or clicks.  Trace bilateral leg and distal upper extremity edema.  Telemetry discontinued 10/20. Gastrointestinal system: Abdomen is nondistended/obese, soft and nontender. No organomegaly or masses felt. Normal bowel sounds  heard. Central nervous system: Alert and oriented. No focal neurological deficits. Extremities: Symmetric 5 x 5 power. Skin: No rashes, lesions or ulcers Psychiatry: Judgement and insight appear normal. Mood & affect appropriate.     Data Reviewed:   I have personally reviewed following labs and imaging studies   CBC: Recent Labs  Lab 09/04/20 0408 09/04/20 0408 09/05/20 0506 09/06/20 0415 09/07/20 0100  WBC 12.9*   < > 9.5 7.7 6.8  NEUTROABS 7.8*  --   --   --   --   HGB 8.4*   < > 8.6* 8.4* 8.7*  HCT 27.0*   < > 28.4* 27.5* 28.4*  MCV 77.8*   < > 78.9* 79.5* 80.5  PLT 302   < > 306 281 264   < > = values in this interval not displayed.    Basic Metabolic Panel: Recent Labs  Lab 09/04/20 0408 09/04/20 0408 09/05/20 0506 09/06/20 0415 09/07/20 0100  NA 139   < > 135 134* 136  K 4.0   < > 4.1 4.1 3.7  CL 99   < > 95* 97* 99  CO2 29   < > 30 30 28   GLUCOSE 175*   < > 156* 138* 114*  BUN 26*   < > 21* 18 13  CREATININE 0.68   < > 0.67 0.66 0.64  CALCIUM 8.3*   < > 8.2* 8.2* 8.2*  MG 1.9   < > 1.7 2.2 1.8  PHOS 3.8  --  3.0 3.9  --    < > = values in this interval not displayed.    Liver Function Tests: Recent Labs  Lab 09/04/20 0408 09/06/20 0415 09/07/20 0100  AST 42*  --  44*  ALT 151*  --  101*  ALKPHOS 87  --  84  BILITOT 0.6  --  0.7  PROT 5.6*  --  5.8*  ALBUMIN 2.3* 2.5* 2.6*    CBG: Recent Labs  Lab 09/06/20 2112 09/07/20 0753 09/07/20 1209  GLUCAP 103* 111* 104*    Microbiology Studies:   Recent Results (from the past 240 hour(s))  Culture, blood (Routine X 2) w Reflex to ID Panel     Status: None   Collection Time: 08/31/20 10:31 AM   Specimen: BLOOD  Result Value Ref Range Status   Specimen Description   Final    BLOOD LEFT FOOT Performed at [facility], [place] [address], [place], [place] [address]    Special Requests   Final    BOTTLES DRAWN AEROBIC AND ANAEROBIC Blood Culture adequate volume Performed at  [place] [address] [place], [place] [address]    Culture   Final    NO GROWTH 5 DAYS Performed  at [facility], [place] [address] [place], [place] [address]    Report Status 09/05/2020 FINAL  Final  Culture, blood (Routine X 2) w Reflex to ID Panel     Status: None   Collection Time: 08/31/20 10:46 AM   Specimen: BLOOD  Result Value Ref Range Status   Specimen Description   Final    BLOOD RIGHT FOOT Performed at [place] [address] [place], [place] [address]    Special Requests   Final    BOTTLES DRAWN AEROBIC AND ANAEROBIC Blood Culture adequate volume Performed at [place] [address], [place], [place] [address]    Culture   Final    NO GROWTH 5 DAYS Performed at [facility], [place] [address], [place], [place] [address]    Report Status 09/05/2020 FINAL  Final     Radiology Studies:  No results found.   Scheduled Meds:   . (feeding supplement) PROSource Plus  30 mL Oral BID BM  . amLODipine  10 mg Oral Daily  . Chlorhexidine Gluconate Cloth  6 each Topical Daily  . enoxaparin (LOVENOX) injection  100 mg Subcutaneous Q12H  . feeding supplement ([employer] STANDARD 1.4)  325 mL Oral Daily  . insulin aspart  0-15 Units Subcutaneous TID WC  . insulin aspart  0-5 Units Subcutaneous QHS  . insulin glargine  7 Units Subcutaneous QHS  . metoprolol succinate  75 mg Oral Daily  . multivitamin with minerals  1 tablet Oral Daily  . pantoprazole  40 mg Oral Q0600  . polyethylene glycol  17 g Oral Daily  . sodium chloride flush  10-40 mL Intracatheter Q12H    Continuous Infusions:   . lactated ringers 10 mL/hr at 09/07/20 1211     LOS: 26 days     Vernell Leep, MD, [place], [facility]. [facility]    To contact the attending provider between 7A-7P or the covering provider during after hours 7P-7A, please log into the web site [email or web address] and access using universal [facility] password for that web site. If you do not have the password, please call the hospital operator.  09/07/2020, 3:01 PM

## 2020-09-07 NOTE — Progress Notes (Signed)
Pt taken for EGD/colonoscopy in w/c by Endo staff at this time.

## 2020-09-08 ENCOUNTER — Encounter (HOSPITAL_COMMUNITY): Payer: Self-pay | Admitting: Gastroenterology

## 2020-09-08 DIAGNOSIS — K567 Ileus, unspecified: Secondary | ICD-10-CM | POA: Diagnosis not present

## 2020-09-08 LAB — COMPREHENSIVE METABOLIC PANEL
ALT: 71 U/L — ABNORMAL HIGH (ref 0–44)
AST: 25 U/L (ref 15–41)
Albumin: 2.6 g/dL — ABNORMAL LOW (ref 3.5–5.0)
Alkaline Phosphatase: 76 U/L (ref 38–126)
Anion gap: 8 (ref 5–15)
BUN: 15 mg/dL (ref 6–20)
CO2: 27 mmol/L (ref 22–32)
Calcium: 8 mg/dL — ABNORMAL LOW (ref 8.9–10.3)
Chloride: 105 mmol/L (ref 98–111)
Creatinine, Ser: 0.72 mg/dL (ref 0.61–1.24)
GFR, Estimated: >60 mL/min (ref >60)
Glucose, Bld: 124 mg/dL — ABNORMAL HIGH (ref 70–99)
Potassium: 3.5 mmol/L (ref 3.5–5.1)
Sodium: 140 mmol/L (ref 135–145)
Total Bilirubin: 0.7 mg/dL (ref 0.3–1.2)
Total Protein: 5.3 g/dL — ABNORMAL LOW (ref 6.5–8.1)

## 2020-09-08 LAB — CBC
HCT: 26.8 % — ABNORMAL LOW (ref 39.0–52.0)
Hemoglobin: 8.1 g/dL — ABNORMAL LOW (ref 13.0–17.0)
MCH: 24.7 pg — ABNORMAL LOW (ref 26.0–34.0)
MCHC: 30.2 g/dL (ref 30.0–36.0)
MCV: 81.7 fL (ref 80.0–100.0)
Platelets: 256 10*3/uL (ref 150–400)
RBC: 3.28 MIL/uL — ABNORMAL LOW (ref 4.22–5.81)
RDW: 23.6 % — ABNORMAL HIGH (ref 11.5–15.5)
WBC: 6.5 10*3/uL (ref 4.0–10.5)
nRBC: 0 % (ref 0.0–0.2)

## 2020-09-08 LAB — GLUCOSE, CAPILLARY
Glucose-Capillary: 118 mg/dL — ABNORMAL HIGH (ref 70–99)
Glucose-Capillary: 205 mg/dL — ABNORMAL HIGH (ref 70–99)
Glucose-Capillary: 72 mg/dL (ref 70–99)
Glucose-Capillary: 159 mg/dL — ABNORMAL HIGH (ref 70–99)

## 2020-09-08 LAB — MAGNESIUM: Magnesium: 1.7 mg/dL (ref 1.7–2.4)

## 2020-09-08 NOTE — Progress Notes (Signed)
PROGRESS NOTE   [name]  BSJ:[id number]    DOB: May 25, 1961    DOA: 08/10/2020  PCP: Charlott Rakes, MD   I have briefly reviewed patients previous medical records in [facility].  Chief Complaint  Patient presents with  . Leg Swelling  . Medical Clearance    rehab placement     Brief Narrative:  59 year old male with PMH of polyarticular gout, IDDM/type II DM, iron deficiency anemia, HTN, recent hospitalization 8/11-8/24 for nontraumatic/possibly statin related rhabdomyolysis with AKI, found to have LUE DVT, discharged to SNF, presented with complaints of generalized weakness, malaise, fevers, joint pains, and noted to have abdominal distention with nausea and vomiting.  Admitted for sepsis, UTI, bowel obstruction/ileus.  General surgery and GI consulted.  S/p EGD and colonoscopy on 10/21.  Patient is medically stable for DC to SNF.  Per [facility] team, insurance authorization pending.   Assessment & Plan:  Principal Problem:   Ileus ([place]) Active Problems:   Essential hypertension   Gout   Iron deficiency anemia   Elevated troponin   Generalized weakness   Uncontrolled type 2 diabetes mellitus with hyperglycemia, without long-term current use of insulin (HCC)   Acute deep vein thrombosis (DVT) of brachial vein of left upper extremity (HCC)   Hypokalemia   CKD (chronic kidney disease) stage 2, GFR 60-89 ml/min   Polyarticular gout   Nasogastric tube present   Acidosis, metabolic   Sepsis due to Escherichia coli without acute organ dysfunction (HCC)   Colonic and small 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  Has had multiple imaging studies to evaluate/follow same.  General surgery and GI consulted.  S/p NG tube and neostigmine x1 dose.  S/p PICC line and TPN initiation.  General surgery signed off  08/22/2020.  Diet finally advanced to regular consistency which he tolerated and TPN was discontinued on 09/06/2020.  EGD showed portal hypertensive gastropathy and colonoscopy was normal on 09/07/2020.  Ileus has resolved. Patient tolerating regular consistency diet.  Having regular BMs and passing flatus.  Polyarticular gout with osteoarthritis/acute gouty arthritis:  Patient had previous work-up with rheumatoid factor and ANA which were negative.  Steroids and colchicine held on admission.  Allopurinol discontinued on admission.  He had acute gouty flare of right hand on 10/13 for which he completed treatment with IV Solu-Medrol.  Outpatient follow-up with rheumatology.  Type II DM/IDDM with neuropathy/nephropathy  A1c 7.1.  Reasonably controlled on current low-dose Lantus and SSI.  Monitor and adjust as needed.  Hypokalemia/hypomagnesemia  Replaced.  Follow periodically.  Left upper extremity DVT  Was on apixaban twice daily.  Was on IV heparin which was changed to full dose Lovenox and can be transitioned back to apixaban after EGD and colonoscopy when cleared by GI.  Resumed on full dose Lovenox postprocedure.  Last received apixaban on 9/27.  Plan to discuss with pharmacy and transition back to apixaban tomorrow.  Iron deficiency anemia  S/p IV Feraheme 08/12/2020.  EGD and colonoscopy 10/21 without significant findings to explain anemia.  Hemoglobin stable in the mid 8 g range.  Resume iron supplements at discharge.  Sepsis secondary to E. coli UTI, POA then E. coli/Pseudomonas UTI.  Initial urine cultures showed E. coli, started on Keflex subsequently transitioned to IV Ancef while n.p.o. and finished course 08/19/2020.  Noted fevers 08/26/2020, started on IV ceftriaxone, urine cultures positive for E. coli and Pseudomonas  aeruginosa, completed course of IV cefepime.  Sepsis resolved.  Prior bilateral knee surgeries  SNF when medically stable.  Stage II  chronic kidney disease  Stable  Metabolic acidosis:  Initial concern for abdominal ischemia as patient with significant colonic ileus with no significant improvement early on in admission.  S/p bicarbonate drip.  Resolved.  Nutrition  Due to patient's colonic distention/ileus patient had been n.p.o. for several days early on in admission.  S/p NG tube and rectal tube placement-both discontinued.  PICC line placed, was on TPN which was stopped 10/21.  Tolerating regular diet.  Severe onychogryphosis  QT prolongation  Improved.  EKG 08/22/2020: QTC 483 ms.  Monitor EKG periodically and avoid QT prolonging medications.  Essential hypertension:  Controlled.  Abnormal LFTs:  Mild and improving.  Follow LFTs as outpatient.  Body mass index is 38.02 kg/m.  Nutritional Status Nutrition Problem: Inadequate oral intake Etiology: acute illness Signs/Symptoms: other (comment) (FLD unable to provide adequate needs) Interventions: TPN, Other (Comment) [name] 1.4 po; Prosource Plus)  DVT prophylaxis:   Currently on full dose Lovenox.   Code Status: Full Code Family Communication: None at bedside Disposition:  Status is: Inpatient  Remains inpatient appropriate because:Inpatient level of care appropriate due to severity of illness   Dispo:  Patient From:    Planned Disposition: [place]  Expected discharge date: Pending SNF bed and insurance authorization.  Medically stable for discharge: Yes.         Consultants:   Gastroenterology General surgery  Procedures:   As noted above.  EGD 09/07/2020:  Impression: - Normal esophagus. - Portal hypertensive gastropathy. - Normal examined duodenum. - No specimens collected.  Colonoscopy 09/07/2020:  Impression: - The entire examined colon is normal. - No specimens collected.  Antimicrobials:    Anti-infectives (From admission, onward)   Start     Dose/Rate Route Frequency Ordered Stop    08/30/20 1100  ceFEPIme (MAXIPIME) 2 g in sodium chloride 0.9 % 100 mL IVPB        2 g 200 mL/hr over 30 Minutes Intravenous Every 12 hours 08/30/20 1003 09/05/20 2240   08/29/20 1030  azithromycin (ZITHROMAX) 500 mg in sodium chloride 0.9 % 250 mL IVPB        500 mg 250 mL/hr over 60 Minutes Intravenous Every 24 hours 08/29/20 0940 09/01/20 1405   08/26/20 2300  cefTRIAXone (ROCEPHIN) 1 g in sodium chloride 0.9 % 100 mL IVPB  Status:  Discontinued        1 g 200 mL/hr over 30 Minutes Intravenous Every 24 hours 08/26/20 2209 08/30/20 0956   08/15/20 1600  ceFAZolin (ANCEF) IVPB 1 g/50 mL premix        1 g 100 mL/hr over 30 Minutes Intravenous Every 8 hours 08/15/20 1429 08/19/20 2243   08/14/20 1730  cefTRIAXone (ROCEPHIN) 1 g in sodium chloride 0.9 % 100 mL IVPB  Status:  Discontinued        1 g 200 mL/hr over 30 Minutes Intravenous Every 24 hours 08/14/20 1647 08/15/20 1429   08/12/20 1230  cephALEXin (KEFLEX) capsule 500 mg  Status:  Discontinued        500 mg Oral Every 12 hours 08/12/20 1139 08/14/20 1647        Subjective:  Denies complaints.  Wants to know when he is being discharged to rehab.  Tolerating regular consistency diet.  Having BMs.  No nausea, vomiting or abdominal pain  Objective:   Vitals:   09/07/20  2132 09/08/20 0500 09/08/20 1007 09/08/20 1332  BP: 129/69 140/80 127/82 117/75  Pulse: 85 90 91 79  Resp: 18 17  18   Temp: 98.2 F (36.8 C) 97.9 F (36.6 C)  98.2 F (36.8 C)  TempSrc: Oral Oral  Oral  SpO2: 98% 99%  100%  Weight:      Height:        General exam: Pleasant middle-age male, moderately built and obese sitting up comfortably in chair this morning. Respiratory system: Clear to auscultation. Respiratory effort normal. Cardiovascular system: S1 & S2 heard, RRR. No JVD, murmurs, rubs, gallops or clicks.  Trace bilateral leg and distal upper extremity edema.  Stable. Gastrointestinal system: Abdomen is nondistended/obese, soft and nontender. No  organomegaly or masses felt. Normal bowel sounds heard. Central nervous system: Alert and oriented. No focal neurological deficits. Extremities: Symmetric 5 x 5 power. Skin: No rashes, lesions or ulcers Psychiatry: Judgement and insight appear normal. Mood & affect appropriate.     Data Reviewed:   I have personally reviewed following labs and imaging studies   CBC: Recent Labs  Lab 09/04/20 0408 09/05/20 0506 09/06/20 0415 09/07/20 0100 09/08/20 0426  WBC 12.9*   < > 7.7 6.8 6.5  NEUTROABS 7.8*  --   --   --   --   HGB 8.4*   < > 8.4* 8.7* 8.1*  HCT 27.0*   < > 27.5* 28.4* 26.8*  MCV 77.8*   < > 79.5* 80.5 81.7  PLT 302   < > 281 264 256   < > = values in this interval not displayed.    Basic Metabolic Panel: Recent Labs  Lab 09/04/20 0408 09/04/20 0408 09/05/20 0506 09/05/20 0506 09/06/20 0415 09/07/20 0100 09/08/20 0426  NA 139   < > 135   < > 134* 136 140  K 4.0   < > 4.1   < > 4.1 3.7 3.5  CL 99   < > 95*   < > 97* 99 105  CO2 29   < > 30   < > 30 28 27   GLUCOSE 175*   < > 156*   < > 138* 114* 124*  BUN 26*   < > 21*   < > 18 13 15   CREATININE 0.68   < > 0.67   < > 0.66 0.64 0.72  CALCIUM 8.3*   < > 8.2*   < > 8.2* 8.2* 8.0*  MG 1.9   < > 1.7   < > 2.2 1.8 1.7  PHOS 3.8  --  3.0  --  3.9  --   --    < > = values in this interval not displayed.    Liver Function Tests: Recent Labs  Lab 09/04/20 0408 09/04/20 0408 09/06/20 0415 09/07/20 0100 09/08/20 0426  AST 42*  --   --  44* 25  ALT 151*  --   --  101* 71*  ALKPHOS 87  --   --  84 76  BILITOT 0.6  --   --  0.7 0.7  PROT 5.6*  --   --  5.8* 5.3*  ALBUMIN 2.3*   < > 2.5* 2.6* 2.6*   < > = values in this interval not displayed.    CBG: Recent Labs  Lab 09/08/20 0723 09/08/20 1125 09/08/20 1655  GLUCAP 118* 205* 72    Microbiology Studies:   Recent Results (from the past 240 hour(s))  Culture, blood (Routine X 2) w Reflex  to ID Panel     Status: None   Collection Time: 08/31/20 10:31  AM   Specimen: BLOOD  Result Value Ref Range Status   Specimen Description   Final    BLOOD LEFT FOOT Performed at [facility] [address], [place], [place] [address]    Special Requests   Final    BOTTLES DRAWN AEROBIC AND ANAEROBIC Blood Culture adequate volume Performed at [facility] [address] [place], [place] [address]    Culture   Final    NO GROWTH 5 DAYS Performed at [facility], [place] [address] [place], [place] [address]    Report Status 09/05/2020 FINAL  Final  Culture, blood (Routine X 2) w Reflex to ID Panel     Status: None   Collection Time: 08/31/20 10:46 AM   Specimen: BLOOD  Result Value Ref Range Status   Specimen Description   Final    BLOOD RIGHT FOOT Performed at [facility] [address] [place], [place] [address]    Special Requests   Final    BOTTLES DRAWN AEROBIC AND ANAEROBIC Blood Culture adequate volume Performed at [facility] [address] [place], [place] [address]    Culture   Final    NO GROWTH 5 DAYS Performed at [facility], [place] [address], [place], [place] [address]    Report Status 09/05/2020 FINAL  Final     Radiology Studies:  No results found.   Scheduled Meds:   . (feeding supplement) PROSource Plus  30 mL Oral BID BM  . amLODipine  10 mg Oral Daily  . Chlorhexidine Gluconate Cloth  6 each Topical Daily  . enoxaparin (LOVENOX) injection  100 mg Subcutaneous Q12H  . feeding supplement (KATE FARMS STANDARD 1.4)  325 mL Oral Daily  . insulin aspart  0-15 Units Subcutaneous TID WC  . insulin aspart  0-5 Units Subcutaneous QHS  . insulin glargine  7 Units Subcutaneous QHS  . metoprolol succinate  75 mg Oral Daily  . multivitamin with minerals  1 tablet Oral Daily  . pantoprazole  40 mg Oral Q0600  . polyethylene glycol  17 g Oral Daily  . sodium chloride flush  10-40 mL Intracatheter Q12H    Continuous Infusions:       LOS: 27 days     Vernell Leep, MD, [place], [facility]. [facility]    To contact the attending provider between 7A-7P or the covering provider during after hours 7P-7A, please log into the web site [email or web address] and access using universal Gem password for that web site. If you do not have the password, please call the hospital operator.  09/08/2020, 6:57 PM

## 2020-09-08 NOTE — Plan of Care (Signed)
  Problem: Health Behavior/Discharge Planning: Goal: Ability to manage health-related needs will improve Outcome: Progressing   Problem: Clinical Measurements: Goal: Ability to maintain clinical measurements within normal limits will improve Outcome: Progressing Goal: Diagnostic test results will improve Outcome: Progressing Goal: Cardiovascular complication will be avoided Outcome: Progressing   Problem: Activity: Goal: Risk for activity intolerance will decrease Outcome: Progressing   Problem: Nutrition: Goal: Adequate nutrition will be maintained Outcome: Progressing   Problem: Pain Managment: Goal: General experience of comfort will improve Outcome: Progressing   Problem: Safety: Goal: Ability to remain free from injury will improve Outcome: Progressing   Problem: Skin Integrity: Goal: Risk for impaired skin integrity will decrease Outcome: Progressing

## 2020-09-08 NOTE — Progress Notes (Signed)
[place] for IV therapeutic LMWH Indication: history of DVT, apixaban on hold  Allergies  Allergen Reactions  . Hctz [Hydrochlorothiazide]     Hx of gout; uric acid 14.1 on 03/13/20 Acute gout L elbow 8/11-8/24/21    Patient Measurements: Height: 5\' 5"  (165.1 cm) Weight: 103.6 kg (228 lb 8 oz) IBW/kg (Calculated) : 61.5  Vital Signs: Temp: 97.9 F (36.6 C) (10/22 0500) Temp Source: Oral (10/22 0500) BP: 140/80 (10/22 0500) Pulse Rate: 90 (10/22 0500)  Labs: Recent Labs    09/06/20 0415 09/06/20 0415 09/07/20 0100 09/08/20 0426  HGB 8.4*   < > 8.7* 8.1*  HCT 27.5*  --  28.4* 26.8*  PLT 281  --  264 256  CREATININE 0.66  --  0.64 0.72   < > = values in this interval not displayed.    Estimated Creatinine Clearance: 108.8 mL/min (by C-G formula based on SCr of 0.72 mg/dL).  Assessment: 40 y/oM with PMH of DVT (07/03/2020) on Apixaban PTA admitted on 08/10/2020 with SIRS. PO meds held d/t colonic ileus. Apixaban remains on hold. Pt initially on IV UFH, has been transitioned to therapeutic LMWH (pharmacy to dose).  Anticoagulation: -LD apixaban: 9/27 -IV UFH 9/27 > 10/17 -LMWH 10/17 >  Transitioned to LMWH due to difficult stick and having to obtain HL from feet.  09/08/20 9:45 AM   CBC: Hgb low but stable; Plt WNL  SCr 0.7, WNL   Plan:   Continue enoxaparin 1 mg/kg (100 mg) subQ q12h  Monitor renal function and CBC  Monitor for signs and symptoms of bleeding  F/u ability to resume PO Eliquis  Lenis Noon, PharmD 09/08/20 10:07 AM

## 2020-09-08 NOTE — TOC Progression Note (Signed)
Transition of Care [facility]) - Progression Note    Patient Details  Name: [name] MRN: [id number] Date of Birth: 12/17/1960  Transition of Care [facility]) CM/SW Contact  Liviana Mills, Juliann Pulse, RN Phone Number: 09/08/2020, 9:13 AM  Clinical Narrative:TC today insurance-[employer] gold plus-Awaiting insurance auth  From [facility] tel# [phone]-pending [id number]. [place] following.    Expected Discharge Plan: Skilled Nursing Facility Barriers to Discharge: Ship broker  Expected Discharge Plan and Services Expected Discharge Plan: [place]   Discharge Planning Services: CM Consult Post Acute Care Choice: [place] Living arrangements for the past 2 months: Single Family Home                                       Social Determinants of Health (SDOH) Interventions    Readmission Risk Interventions No flowsheet data found.

## 2020-09-08 NOTE — Progress Notes (Signed)
Occupational Therapy Treatment Patient Details Name: [name] MRN: [id number] DOB: 1961-02-28 Today's Date: 09/08/2020    History of present illness 59 yo male admitted to [facility] on 9/24 with weakness, joint pain, difficulty urinating. Pt meeting SIRS criteria, possibly secondary to urinary infection. NGT placed 10/1 for ileus. Now clamped. PMHx significant for chronic gouty arthritis of multiple joints, CKD II, IDDM, HTN, HLD, and chronic anemia. Admitted to [facility] 8/11-8/24 for AKI, LUE DVT, and joint pain, d/c SNF.   OT comments  Patient exhibits significantly weak grip strength due to decreased ROM and unable to make full composite fist. Therapist instructed patient on how to perform PROM to fingers of PIPs and MCPs. Patient reports pain with stretching but able to tolerance. To work on functional mobility and improve independence with ADLs patient ambulated to bathroom twice with RW. Needed verbal cues for hand placement. After return to recliner patient began stretching his fingers. Cont POC    Follow Up Recommendations  SNF;Supervision/Assistance - 24 hour    Equipment Recommendations  None recommended by OT    Recommendations for Other Services      Precautions / Restrictions Precautions Precautions: Fall Restrictions Weight Bearing Restrictions: No       Mobility Bed Mobility               General bed mobility comments: OOB in recliner  Transfers Overall transfer level: Needs assistance Equipment used: Rolling walker (2 wheeled) Transfers: Sit to/from Stand Sit to Stand: Min guard         General transfer comment: Two hands to push himself up into standing from recliner. min guard to ambulate into bathroom x 2 with rw. verbal cues for hand placement.    Balance Overall balance assessment: Mild deficits observed, not formally tested                                         ADL either performed or assessed with clinical judgement   ADL                                                Vision Patient Visual Report: No change from baseline     Perception     Praxis      Cognition Arousal/Alertness: Awake/alert Behavior During Therapy: Flat affect Overall Cognitive Status: Within Functional Limits for tasks assessed                                 General Comments: No apparant carry over from treatment to treatment.        Exercises Other Exercises Other Exercises: Demonstrated PROM flexion stretches to bilateral hands to improve ROM of fingers - needed for improved grip. Patient lacking approx 1/3 ROM of flexion   Shoulder Instructions       General Comments      Pertinent Vitals/ Pain       Pain Assessment: Faces Faces Pain Scale: Hurts little more Pain Location: Fingers - with PROM. Pain Descriptors / Indicators: Discomfort;Grimacing Pain Intervention(s): Limited activity within patient's tolerance  Home Living  Prior Functioning/Environment              Frequency  Min 2X/week        Progress Toward Goals  OT Goals(current goals can now be found in the care plan section)  Progress towards OT goals: Progressing toward goals  Acute Rehab OT Goals Patient Stated Goal: get stronger OT Goal Formulation: With patient Time For Goal Achievement: 09/22/20 Potential to Achieve Goals: Good  Plan Discharge plan remains appropriate    Co-evaluation          OT goals addressed during session: Strengthening/ROM (functional mobility)      AM-PAC OT "6 Clicks" Daily Activity     Outcome Measure   Help from another person eating meals?: None Help from another person taking care of personal grooming?: A Little Help from another person toileting, which includes using toliet, bedpan, or urinal?: A Lot Help from another person bathing (including washing, rinsing, drying)?: A Lot Help from another person  to put on and taking off regular upper body clothing?: A Lot Help from another person to put on and taking off regular lower body clothing?: A Lot 6 Click Score: 15    End of Session Equipment Utilized During Treatment: Rolling walker;Gait belt  OT Visit Diagnosis: Muscle weakness (generalized) (M62.81);Other abnormalities of gait and mobility (R26.89);Pain Pain - part of body:  (bilateral fingers)   Activity Tolerance Patient tolerated treatment well   Patient Left in chair;with call bell/phone within reach;with chair alarm set   Nurse Communication Mobility status        Time: 1610-9604 OT Time Calculation (min): 11 min  Charges: OT General Charges $OT Visit: 1 Visit OT Treatments $Therapeutic Activity: 8-22 mins  Derl Barrow, OTR/L [place]  Office [phone] Pager: [place] 09/08/2020, 4:17 PM

## 2020-09-08 NOTE — Progress Notes (Signed)
Subjective: No complaints.  Objective: Vital signs in last 24 hours: Temp:  [97.9 F (36.6 C)-98.2 F (36.8 C)] 98.2 F (36.8 C) (10/22 1332) Pulse Rate:  [79-91] 79 (10/22 1332) Resp:  [17-18] 18 (10/22 1332) BP: (117-140)/(69-82) 117/75 (10/22 1332) SpO2:  [98 %-100 %] 100 % (10/22 1332) Last BM Date: 09/07/20  Intake/Output from previous day: 10/21 0701 - 10/22 0700 In: 1074.1 [P.O.:480; I.V.:594.1] Out: 1550 [Urine:1550] Intake/Output this shift: No intake/output data recorded.  General appearance: alert and no distress GI: soft, +BS, some tympany  Lab Results: Recent Labs    09/06/20 0415 09/07/20 0100 09/08/20 0426  WBC 7.7 6.8 6.5  HGB 8.4* 8.7* 8.1*  HCT 27.5* 28.4* 26.8*  PLT 281 264 256   BMET Recent Labs    09/06/20 0415 09/07/20 0100 09/08/20 0426  NA 134* 136 140  K 4.1 3.7 3.5  CL 97* 99 105  CO2 30 28 27   GLUCOSE 138* 114* 124*  BUN 18 13 15   CREATININE 0.66 0.64 0.72  CALCIUM 8.2* 8.2* 8.0*   LFT Recent Labs    09/08/20 0426  PROT 5.3*  ALBUMIN 2.6*  AST 25  ALT 71*  ALKPHOS 76  BILITOT 0.7   PT/INR No results for input(s): LABPROT, INR in the last 72 hours. Hepatitis Panel No results for input(s): HEPBSAG, HCVAB, HEPAIGM, HEPBIGM in the last 72 hours. C-Diff No results for input(s): CDIFFTOX in the last 72 hours. Fecal Lactopherrin No results for input(s): FECLLACTOFRN in the last 72 hours.  Studies/Results: No results found.  Medications:  Scheduled:  (feeding supplement) PROSource Plus  30 mL Oral BID BM   amLODipine  10 mg Oral Daily   Chlorhexidine Gluconate Cloth  6 each Topical Daily   enoxaparin (LOVENOX) injection  100 mg Subcutaneous Q12H   feeding supplement (KATE FARMS STANDARD 1.4)  325 mL Oral Daily   insulin aspart  0-15 Units Subcutaneous TID WC   insulin aspart  0-5 Units Subcutaneous QHS   insulin glargine  7 Units Subcutaneous QHS   metoprolol succinate  75 mg Oral Daily   multivitamin  with minerals  1 tablet Oral Daily   pantoprazole  40 mg Oral Q0600   polyethylene glycol  17 g Oral Daily   sodium chloride flush  10-40 mL Intracatheter Q12H   Continuous:   Assessment/Plan: 1) Ileus - resolved. 2) IDA - no current evidence of a GI source.   The patient's abdomen is soft.  He feels that it is back to his baseline.  He is passing bowel movements and flatus.  There are no problems with tolerating PO.  His EGD/colonoscopy was negative for any GI source of his anemia.  His HGB has fluctuated in the 8 range after his blood transfusion, but it is relatively stable.  Plan: 1) Iron supplemenation. 2) Follow with PCP for his anemia. 3) No further GI recommendations.  Signing off.  LOS: 27 days   Brylee Mcgreal D 09/08/2020, 2:36 PM

## 2020-09-09 DIAGNOSIS — K567 Ileus, unspecified: Secondary | ICD-10-CM | POA: Diagnosis not present

## 2020-09-09 LAB — CBC
HCT: 27.3 % — ABNORMAL LOW (ref 39.0–52.0)
Hemoglobin: 8.1 g/dL — ABNORMAL LOW (ref 13.0–17.0)
MCH: 24.5 pg — ABNORMAL LOW (ref 26.0–34.0)
MCHC: 29.7 g/dL — ABNORMAL LOW (ref 30.0–36.0)
MCV: 82.5 fL (ref 80.0–100.0)
Platelets: 189 10*3/uL (ref 150–400)
RBC: 3.31 MIL/uL — ABNORMAL LOW (ref 4.22–5.81)
RDW: 23.6 % — ABNORMAL HIGH (ref 11.5–15.5)
WBC: 4.5 10*3/uL (ref 4.0–10.5)
nRBC: 0 % (ref 0.0–0.2)

## 2020-09-09 LAB — GLUCOSE, CAPILLARY
Glucose-Capillary: 135 mg/dL — ABNORMAL HIGH (ref 70–99)
Glucose-Capillary: 165 mg/dL — ABNORMAL HIGH (ref 70–99)
Glucose-Capillary: 158 mg/dL — ABNORMAL HIGH (ref 70–99)

## 2020-09-09 LAB — MAGNESIUM: Magnesium: 1.7 mg/dL (ref 1.7–2.4)

## 2020-09-09 MED ORDER — APIXABAN 5 MG PO TABS
5.0000 mg | ORAL_TABLET | Freq: Two times a day (BID) | ORAL | Status: DC
  Administered 2020-09-09 – 2020-09-11 (×6): 5 mg via ORAL
  Filled 2020-09-09 (×6): qty 1

## 2020-09-09 NOTE — Progress Notes (Signed)
PROGRESS NOTE   [name]  HUT:[id number]    DOB: 04-03-1961    DOA: 08/10/2020  PCP: Charlott Rakes, MD   I have briefly reviewed patients previous medical records in [facility].  Chief Complaint  Patient presents with  . Leg Swelling  . Medical Clearance    rehab placement     Brief Narrative:  59 year old male with PMH of polyarticular gout, IDDM/type II DM, iron deficiency anemia, HTN, recent hospitalization 8/11-8/24 for nontraumatic/possibly statin related rhabdomyolysis with AKI, found to have LUE DVT, discharged to SNF, presented with complaints of generalized weakness, malaise, fevers, joint pains, and noted to have abdominal distention with nausea and vomiting.  Admitted for sepsis, UTI, bowel obstruction/ileus.  General surgery and GI consulted.  S/p EGD and colonoscopy on 10/21.  Patient is medically stable for DC to SNF.  Per [facility] team, insurance authorization pending.   Assessment & Plan:  Principal Problem:   Ileus ([place]) Active Problems:   Essential hypertension   Gout   Iron deficiency anemia   Elevated troponin   Generalized weakness   Uncontrolled type 2 diabetes mellitus with hyperglycemia, without long-term current use of insulin (HCC)   Acute deep vein thrombosis (DVT) of brachial vein of left upper extremity (HCC)   Hypokalemia   CKD (chronic kidney disease) stage 2, GFR 60-89 ml/min   Polyarticular gout   Nasogastric tube present   Acidosis, metabolic   Sepsis due to Escherichia coli without acute organ dysfunction (HCC)   Colonic and small 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  Has had multiple imaging studies to evaluate/follow same.  General surgery and GI consulted.  S/p NG tube and neostigmine x1 dose.  S/p PICC line and TPN initiation.  General surgery signed off  08/22/2020.  Diet finally advanced to regular consistency which he tolerated and TPN was discontinued on 09/06/2020.  EGD showed portal hypertensive gastropathy and colonoscopy was normal on 09/07/2020.  Ileus has resolved. Patient tolerating regular consistency diet.  Having regular BMs and passing flatus.  Stable without change.  Polyarticular gout with osteoarthritis/acute gouty arthritis:  Patient had previous work-up with rheumatoid factor and ANA which were negative.  Steroids and colchicine held on admission.  Allopurinol discontinued on admission.  He had acute gouty flare of right hand on 10/13 for which he completed treatment with IV Solu-Medrol.  Outpatient follow-up with rheumatology.  Type II DM/IDDM with neuropathy/nephropathy  A1c 7.1.  Reasonably controlled on current low-dose Lantus and SSI.  Monitor and adjust as needed.  Hypokalemia/hypomagnesemia  Replaced.  Follow periodically.  Left upper extremity DVT  Was on apixaban twice daily.  Was on IV heparin which was changed to full dose Lovenox and can be transitioned back to apixaban after EGD and colonoscopy when cleared by GI.  Resumed on full dose Lovenox postprocedure.  Last received apixaban on 9/27.  Discussed with pharmacy this morning.  Patient was on Eliquis PTA, briefly in the hospital until 9/27 when switched to IV heparin followed by full dose Lovenox.  Resume Eliquis per pharmacy 10/23.  Iron deficiency anemia  S/p IV Feraheme 08/12/2020.  EGD and colonoscopy 10/21 without significant findings to explain anemia.  Hemoglobin stable in the mid 8 g range.  Resume iron supplements at discharge.  Follow CBC as outpatient.  Sepsis secondary to E. coli UTI, POA then E. coli/Pseudomonas UTI.  Initial urine cultures showed E.  coli, started on Keflex subsequently transitioned to IV Ancef while n.p.o. and finished course 08/19/2020.  Noted fevers 08/26/2020, started on IV ceftriaxone, urine cultures  positive for E. coli and Pseudomonas aeruginosa, completed course of IV cefepime.  Sepsis resolved.  Prior bilateral knee surgeries  SNF when medically stable.  Stage II chronic kidney disease  Stable  Metabolic acidosis:  Initial concern for abdominal ischemia as patient with significant colonic ileus with no significant improvement early on in admission.  S/p bicarbonate drip.  Resolved.  Nutrition  Due to patient's colonic distention/ileus patient had been n.p.o. for several days early on in admission.  S/p NG tube and rectal tube placement-both discontinued.  PICC line placed, was on TPN which was stopped 10/21.  Tolerating regular diet.  Severe onychogryphosis  QT prolongation  Improved.  EKG 08/22/2020: QTC 483 ms.  Monitor EKG periodically and avoid QT prolonging medications.  Essential hypertension:  Controlled.  Abnormal LFTs:  Mild and improving.  Follow LFTs as outpatient.  Body mass index is 38.02 kg/m./Obesity  Nutritional Status Nutrition Problem: Inadequate oral intake Etiology: acute illness Signs/Symptoms: other (comment) (FLD unable to provide adequate needs) Interventions: TPN, Other (Comment) [name] 1.4 po; Prosource Plus)  DVT prophylaxis:   Full dose Lovenox switched to apixaban on 09/09/2020.   Code Status: Full Code Family Communication: None at bedside Disposition:  Status is: Inpatient  Remains inpatient appropriate because:Inpatient level of care appropriate due to severity of illness   Dispo:  Patient From:    Planned Disposition: [place]  Expected discharge date: Pending SNF bed and insurance authorization.  Medically stable for discharge: Yes.         Consultants:   Gastroenterology General surgery  Procedures:   As noted above.  EGD 09/07/2020:  Impression: - Normal esophagus. - Portal hypertensive gastropathy. - Normal examined duodenum. - No specimens collected.  Colonoscopy  09/07/2020:  Impression: - The entire examined colon is normal. - No specimens collected.  Antimicrobials:    Anti-infectives (From admission, onward)   Start     Dose/Rate Route Frequency Ordered Stop   08/30/20 1100  ceFEPIme (MAXIPIME) 2 g in sodium chloride 0.9 % 100 mL IVPB        2 g 200 mL/hr over 30 Minutes Intravenous Every 12 hours 08/30/20 1003 09/05/20 2240   08/29/20 1030  azithromycin (ZITHROMAX) 500 mg in sodium chloride 0.9 % 250 mL IVPB        500 mg 250 mL/hr over 60 Minutes Intravenous Every 24 hours 08/29/20 0940 09/01/20 1405   08/26/20 2300  cefTRIAXone (ROCEPHIN) 1 g in sodium chloride 0.9 % 100 mL IVPB  Status:  Discontinued        1 g 200 mL/hr over 30 Minutes Intravenous Every 24 hours 08/26/20 2209 08/30/20 0956   08/15/20 1600  ceFAZolin (ANCEF) IVPB 1 g/50 mL premix        1 g 100 mL/hr over 30 Minutes Intravenous Every 8 hours 08/15/20 1429 08/19/20 2243   08/14/20 1730  cefTRIAXone (ROCEPHIN) 1 g in sodium chloride 0.9 % 100 mL IVPB  Status:  Discontinued        1 g 200 mL/hr over 30 Minutes Intravenous Every 24 hours 08/14/20 1647 08/15/20 1429   08/12/20 1230  cephALEXin (KEFLEX) capsule 500 mg  Status:  Discontinued        500 mg Oral Every 12 hours 08/12/20 1139 08/14/20 1647        Subjective:  No complaints  reported.  Tolerating diet, having BMs, no bleeding reported.  Objective:   Vitals:   09/08/20 1007 09/08/20 1332 09/08/20 1958 09/09/20 0513  BP: 127/82 117/75 (!) 146/90 (!) 152/81  Pulse: 91 79 78 93  Resp:  18 16 16   Temp:  98.2 F (36.8 C) 98.1 F (36.7 C) 98.2 F (36.8 C)  TempSrc:  Oral Oral Oral  SpO2:  100% 100% 95%  Weight:      Height:        General exam: Pleasant middle-age male, moderately built and obese sitting up comfortably in chair this morning. Respiratory system: Clear to auscultation.  No increased work of breathing. Cardiovascular system: S1 & S2 heard, RRR. No JVD, murmurs, rubs, gallops or clicks.   Trace bilateral leg and distal upper extremity edema.  Stable. Gastrointestinal system: Abdomen is nondistended/obese, soft and nontender. No organomegaly or masses felt. Normal bowel sounds heard. Central nervous system: Alert and oriented. No focal neurological deficits. Extremities: Symmetric 5 x 5 power. Skin: No rashes, lesions or ulcers Psychiatry: Judgement and insight appear normal. Mood & affect appropriate.     Data Reviewed:   I have personally reviewed following labs and imaging studies   CBC: Recent Labs  Lab 09/04/20 0408 09/05/20 0506 09/07/20 0100 09/08/20 0426 09/09/20 0430  WBC 12.9*   < > 6.8 6.5 4.5  NEUTROABS 7.8*  --   --   --   --   HGB 8.4*   < > 8.7* 8.1* 8.1*  HCT 27.0*   < > 28.4* 26.8* 27.3*  MCV 77.8*   < > 80.5 81.7 82.5  PLT 302   < > 264 256 189   < > = values in this interval not displayed.    Basic Metabolic Panel: Recent Labs  Lab 09/04/20 0408 09/04/20 0408 09/05/20 0506 09/05/20 0506 09/06/20 0415 09/06/20 0415 09/07/20 0100 09/08/20 0426 09/09/20 0430  NA 139   < > 135   < > 134*  --  136 140  --   K 4.0   < > 4.1   < > 4.1  --  3.7 3.5  --   CL 99   < > 95*   < > 97*  --  99 105  --   CO2 29   < > 30   < > 30  --  28 27  --   GLUCOSE 175*   < > 156*   < > 138*  --  114* 124*  --   BUN 26*   < > 21*   < > 18  --  13 15  --   CREATININE 0.68   < > 0.67   < > 0.66  --  0.64 0.72  --   CALCIUM 8.3*   < > 8.2*   < > 8.2*  --  8.2* 8.0*  --   MG 1.9   < > 1.7   < > 2.2   < > 1.8 1.7 1.7  PHOS 3.8  --  3.0  --  3.9  --   --   --   --    < > = values in this interval not displayed.    Liver Function Tests: Recent Labs  Lab 09/04/20 0408 09/04/20 0408 09/06/20 0415 09/07/20 0100 09/08/20 0426  AST 42*  --   --  44* 25  ALT 151*  --   --  101* 71*  ALKPHOS 87  --   --  84  76  BILITOT 0.6  --   --  0.7 0.7  PROT 5.6*  --   --  5.8* 5.3*  ALBUMIN 2.3*   < > 2.5* 2.6* 2.6*   < > = values in this interval not displayed.     CBG: Recent Labs  Lab 09/08/20 1655 09/08/20 2003 09/09/20 0727  GLUCAP 72 159* 135*    Microbiology Studies:   Recent Results (from the past 240 hour(s))  Culture, blood (Routine X 2) w Reflex to ID Panel     Status: None   Collection Time: 08/31/20 10:31 AM   Specimen: BLOOD  Result Value Ref Range Status   Specimen Description   Final    BLOOD LEFT FOOT Performed at [facility] [address], [place], [place] [address]    Special Requests   Final    BOTTLES DRAWN AEROBIC AND ANAEROBIC Blood Culture adequate volume Performed at [facility] [address], [place], [place] [address]    Culture   Final    NO GROWTH 5 DAYS Performed at [facility], [place] [address] [place], [place] [address]    Report Status 09/05/2020 FINAL  Final  Culture, blood (Routine X 2) w Reflex to ID Panel     Status: None   Collection Time: 08/31/20 10:46 AM   Specimen: BLOOD  Result Value Ref Range Status   Specimen Description   Final    BLOOD RIGHT FOOT Performed at [facility] [address], [place], [place] [address]    Special Requests   Final    BOTTLES DRAWN AEROBIC AND ANAEROBIC Blood Culture adequate volume Performed at [facility] [address] [place], [place] [address]    Culture   Final    NO GROWTH 5 DAYS Performed at [facility], [place] [address] [place], [place] [address]    Report Status 09/05/2020 FINAL  Final     Radiology Studies:  No results found.   Scheduled Meds:   . (feeding supplement) PROSource Plus  30 mL Oral BID BM  . amLODipine  10 mg Oral Daily  . apixaban  5 mg Oral BID  . Chlorhexidine Gluconate Cloth  6 each Topical Daily  . feeding supplement ([employer] STANDARD 1.4)  325 mL Oral Daily  . insulin aspart  0-15 Units Subcutaneous TID WC  . insulin aspart  0-5 Units Subcutaneous QHS  . insulin glargine  7 Units Subcutaneous QHS  .  metoprolol succinate  75 mg Oral Daily  . multivitamin with minerals  1 tablet Oral Daily  . pantoprazole  40 mg Oral Q0600  . polyethylene glycol  17 g Oral Daily  . sodium chloride flush  10-40 mL Intracatheter Q12H    Continuous Infusions:      LOS: 28 days     Vernell Leep, MD, [place], [facility]. [facility]    To contact the attending provider between 7A-7P or the covering provider during after hours 7P-7A, please log into the web site [email or web address] and access using universal [place] password for that web site. If you do not have the password, please call the hospital operator.  09/09/2020, 10:47 AM

## 2020-09-09 NOTE — Plan of Care (Signed)
  Problem: Health Behavior/Discharge Planning: Goal: Ability to manage health-related needs will improve Outcome: Progressing   Problem: Clinical Measurements: Goal: Ability to maintain clinical measurements within normal limits will improve Outcome: Progressing Goal: Diagnostic test results will improve Outcome: Progressing Goal: Cardiovascular complication will be avoided Outcome: Progressing   Problem: Activity: Goal: Risk for activity intolerance will decrease Outcome: Progressing   Problem: Nutrition: Goal: Adequate nutrition will be maintained Outcome: Progressing   Problem: Pain Managment: Goal: General experience of comfort will improve Outcome: Progressing   Problem: Safety: Goal: Ability to remain free from injury will improve Outcome: Progressing   Problem: Skin Integrity: Goal: Risk for impaired skin integrity will decrease Outcome: Progressing

## 2020-09-10 DIAGNOSIS — K567 Ileus, unspecified: Secondary | ICD-10-CM | POA: Diagnosis not present

## 2020-09-10 LAB — GLUCOSE, CAPILLARY
Glucose-Capillary: 151 mg/dL — ABNORMAL HIGH (ref 70–99)
Glucose-Capillary: 118 mg/dL — ABNORMAL HIGH (ref 70–99)
Glucose-Capillary: 91 mg/dL (ref 70–99)
Glucose-Capillary: 167 mg/dL — ABNORMAL HIGH (ref 70–99)

## 2020-09-10 NOTE — Progress Notes (Signed)
PROGRESS NOTE   [name]  VOJ:[id number]    DOB: 02-21-1961    DOA: 08/10/2020  PCP: Charlott Rakes, MD   I have briefly reviewed patients previous medical records in [facility].  Chief Complaint  Patient presents with  . Leg Swelling  . Medical Clearance    rehab placement     Brief Narrative:  59 year old male with PMH of polyarticular gout, IDDM/type II DM, iron deficiency anemia, HTN, recent hospitalization 8/11-8/24 for nontraumatic/possibly statin related rhabdomyolysis with AKI, found to have LUE DVT, discharged to SNF, presented with complaints of generalized weakness, malaise, fevers, joint pains, and noted to have abdominal distention with nausea and vomiting.  Admitted for sepsis, UTI, bowel obstruction/ileus.  General surgery and GI consulted.  S/p EGD and colonoscopy on 10/21.  Patient has been medically stable for DC to SNF since 09/08/2020.  Per [facility] team, insurance authorization pending.   Assessment & Plan:  Principal Problem:   Ileus ([place]) Active Problems:   Essential hypertension   Gout   Iron deficiency anemia   Elevated troponin   Generalized weakness   Uncontrolled type 2 diabetes mellitus with hyperglycemia, without long-term current use of insulin (HCC)   Acute deep vein thrombosis (DVT) of brachial vein of left upper extremity (HCC)   Hypokalemia   CKD (chronic kidney disease) stage 2, GFR 60-89 ml/min   Polyarticular gout   Nasogastric tube present   Acidosis, metabolic   Sepsis due to Escherichia coli without acute organ dysfunction (HCC)   Colonic and small 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  Has had multiple imaging studies to evaluate/follow same.  General surgery and GI consulted.  S/p NG tube and neostigmine x1 dose.  S/p PICC line and TPN initiation.  General surgery  signed off 08/22/2020.  Diet finally advanced to regular consistency which he tolerated and TPN was discontinued on 09/06/2020.  EGD showed portal hypertensive gastropathy and colonoscopy was normal on 09/07/2020.  Ileus has resolved. Patient tolerating regular consistency diet.  Having regular BMs and passing flatus.  No new or acute issues.  Stable without change.  Polyarticular gout with osteoarthritis/acute gouty arthritis:  Patient had previous work-up with rheumatoid factor and ANA which were negative.  Steroids and colchicine held on admission.  Allopurinol discontinued on admission.  He had acute gouty flare of right hand on 10/13 for which he completed treatment with IV Solu-Medrol.  Outpatient follow-up with rheumatology.  Type II DM/IDDM with neuropathy/nephropathy  A1c 7.1.  Reasonably controlled on current low-dose Lantus and SSI.  Monitor and adjust as needed.  Hypokalemia/hypomagnesemia  Replaced.  Follow periodically.  Left upper extremity DVT  Was on apixaban twice daily.  Was on IV heparin which was changed to full dose Lovenox and can be transitioned back to apixaban after EGD and colonoscopy when cleared by GI.  Resumed on full dose Lovenox postprocedure.  Last received apixaban on 9/27.  Discussed with pharmacy this morning.  Patient was on Eliquis PTA, briefly in the hospital until 9/27 when switched to IV heparin followed by full dose Lovenox.  Resumed Eliquis per pharmacy 10/23.  Iron deficiency anemia  S/p IV Feraheme 08/12/2020.  EGD and colonoscopy 10/21 without significant findings to explain anemia.  Hemoglobin stable in the mid 8 g range.  Resume iron supplements at discharge.  Follow CBC as outpatient.  Sepsis secondary to E. coli UTI, POA then  E. coli/Pseudomonas UTI.  Initial urine cultures showed E. coli, started on Keflex subsequently transitioned to IV Ancef while n.p.o. and finished course 08/19/2020.  Noted fevers 08/26/2020, started  on IV ceftriaxone, urine cultures positive for E. coli and Pseudomonas aeruginosa, completed course of IV cefepime.  Sepsis resolved.  Prior bilateral knee surgeries  SNF when medically stable.  Stage II chronic kidney disease  Stable  Metabolic acidosis:  Initial concern for abdominal ischemia as patient with significant colonic ileus with no significant improvement early on in admission.  S/p bicarbonate drip.  Resolved.  Nutrition  Due to patient's colonic distention/ileus patient had been n.p.o. for several days early on in admission.  S/p NG tube and rectal tube placement-both discontinued.  PICC line placed, was on TPN which was stopped 10/21.  Tolerating regular diet.  Severe onychogryphosis  QT prolongation  Improved.  EKG 08/22/2020: QTC 483 ms.  Monitor EKG periodically and avoid QT prolonging medications.  Essential hypertension:  Controlled.  Abnormal LFTs:  Mild and improving.  Follow LFTs as outpatient.  Body mass index is 38.02 kg/m./Obesity  Nutritional Status Nutrition Problem: Inadequate oral intake Etiology: acute illness Signs/Symptoms: other (comment) (FLD unable to provide adequate needs) Interventions: TPN, Other (Comment) [name] 1.4 po; Prosource Plus)  DVT prophylaxis:   Full dose Lovenox switched to apixaban on 09/09/2020.   Code Status: Full Code Family Communication: I discussed in detail with patient's sister-in-law, updated care and answered questions. Disposition:  Status is: Inpatient  Remains inpatient appropriate because:Inpatient level of care appropriate due to severity of illness   Dispo:  Patient From:    Planned Disposition: [place]  Expected discharge date: Pending SNF bed and insurance authorization.  Medically stable for discharge: Yes.         Consultants:   Gastroenterology General surgery  Procedures:   As noted above.  EGD 09/07/2020:  Impression: - Normal esophagus. -  Portal hypertensive gastropathy. - Normal examined duodenum. - No specimens collected.  Colonoscopy 09/07/2020:  Impression: - The entire examined colon is normal. - No specimens collected.  Antimicrobials:    Anti-infectives (From admission, onward)   Start     Dose/Rate Route Frequency Ordered Stop   08/30/20 1100  ceFEPIme (MAXIPIME) 2 g in sodium chloride 0.9 % 100 mL IVPB        2 g 200 mL/hr over 30 Minutes Intravenous Every 12 hours 08/30/20 1003 09/05/20 2240   08/29/20 1030  azithromycin (ZITHROMAX) 500 mg in sodium chloride 0.9 % 250 mL IVPB        500 mg 250 mL/hr over 60 Minutes Intravenous Every 24 hours 08/29/20 0940 09/01/20 1405   08/26/20 2300  cefTRIAXone (ROCEPHIN) 1 g in sodium chloride 0.9 % 100 mL IVPB  Status:  Discontinued        1 g 200 mL/hr over 30 Minutes Intravenous Every 24 hours 08/26/20 2209 08/30/20 0956   08/15/20 1600  ceFAZolin (ANCEF) IVPB 1 g/50 mL premix        1 g 100 mL/hr over 30 Minutes Intravenous Every 8 hours 08/15/20 1429 08/19/20 2243   08/14/20 1730  cefTRIAXone (ROCEPHIN) 1 g in sodium chloride 0.9 % 100 mL IVPB  Status:  Discontinued        1 g 200 mL/hr over 30 Minutes Intravenous Every 24 hours 08/14/20 1647 08/15/20 1429   08/12/20 1230  cephALEXin (KEFLEX) capsule 500 mg  Status:  Discontinued        500 mg Oral  Every 12 hours 08/12/20 1139 08/14/20 1647        Subjective:  Denies complaints.  Objective:   Vitals:   09/10/20 0540 09/10/20 0935 09/10/20 1349 09/10/20 1353  BP: (!) 151/94 (!) 147/86 131/83   Pulse: 74 75 73   Resp: 17  16   Temp: 98.4 F (36.9 C)   99.1 F (37.3 C)  TempSrc: Oral   Oral  SpO2: 100%  100%   Weight:      Height:        General exam: Pleasant middle-age male, moderately built and obese sitting up comfortably in chair this morning. Respiratory system: Clear to auscultation.  No increased work of breathing. Cardiovascular system: S1 & S2 heard, RRR. No JVD, murmurs, rubs, gallops  or clicks.  Trace bilateral leg and distal upper extremity edema.  Stable. Gastrointestinal system: Abdomen is obese/nondistended, soft and nontender.  No organomegaly or masses appreciated.  Normal bowel sounds heard. Central nervous system: Alert and oriented. No focal neurological deficits. Extremities: Symmetric 5 x 5 power. Skin: No rashes, lesions or ulcers Psychiatry: Judgement and insight appear normal. Mood & affect appropriate.     Data Reviewed:   I have personally reviewed following labs and imaging studies   CBC: Recent Labs  Lab 09/04/20 0408 09/05/20 0506 09/07/20 0100 09/08/20 0426 09/09/20 0430  WBC 12.9*   < > 6.8 6.5 4.5  NEUTROABS 7.8*  --   --   --   --   HGB 8.4*   < > 8.7* 8.1* 8.1*  HCT 27.0*   < > 28.4* 26.8* 27.3*  MCV 77.8*   < > 80.5 81.7 82.5  PLT 302   < > 264 256 189   < > = values in this interval not displayed.    Basic Metabolic Panel: Recent Labs  Lab 09/04/20 0408 09/04/20 0408 09/05/20 0506 09/05/20 0506 09/06/20 0415 09/06/20 0415 09/07/20 0100 09/08/20 0426 09/09/20 0430  NA 139   < > 135   < > 134*  --  136 140  --   K 4.0   < > 4.1   < > 4.1  --  3.7 3.5  --   CL 99   < > 95*   < > 97*  --  99 105  --   CO2 29   < > 30   < > 30  --  28 27  --   GLUCOSE 175*   < > 156*   < > 138*  --  114* 124*  --   BUN 26*   < > 21*   < > 18  --  13 15  --   CREATININE 0.68   < > 0.67   < > 0.66  --  0.64 0.72  --   CALCIUM 8.3*   < > 8.2*   < > 8.2*  --  8.2* 8.0*  --   MG 1.9   < > 1.7   < > 2.2   < > 1.8 1.7 1.7  PHOS 3.8  --  3.0  --  3.9  --   --   --   --    < > = values in this interval not displayed.    Liver Function Tests: Recent Labs  Lab 09/04/20 0408 09/04/20 0408 09/06/20 0415 09/07/20 0100 09/08/20 0426  AST 42*  --   --  44* 25  ALT 151*  --   --  101* 71*  ALKPHOS  87  --   --  84 76  BILITOT 0.6  --   --  0.7 0.7  PROT 5.6*  --   --  5.8* 5.3*  ALBUMIN 2.3*   < > 2.5* 2.6* 2.6*   < > = values in this  interval not displayed.    CBG: Recent Labs  Lab 09/09/20 1637 09/10/20 0924 09/10/20 1149  GLUCAP 158* 151* 118*    Microbiology Studies:   No results found for this or any previous visit (from the past 240 hour(s)).   Radiology Studies:  No results found.   Scheduled Meds:   . (feeding supplement) PROSource Plus  30 mL Oral BID BM  . amLODipine  10 mg Oral Daily  . apixaban  5 mg Oral BID  . Chlorhexidine Gluconate Cloth  6 each Topical Daily  . feeding supplement ([employer] STANDARD 1.4)  325 mL Oral Daily  . insulin aspart  0-15 Units Subcutaneous TID WC  . insulin aspart  0-5 Units Subcutaneous QHS  . insulin glargine  7 Units Subcutaneous QHS  . metoprolol succinate  75 mg Oral Daily  . multivitamin with minerals  1 tablet Oral Daily  . pantoprazole  40 mg Oral Q0600  . polyethylene glycol  17 g Oral Daily  . sodium chloride flush  10-40 mL Intracatheter Q12H    Continuous Infusions:      LOS: 29 days     Vernell Leep, MD, [place], [facility]. [facility]    To contact the attending provider between 7A-7P or the covering provider during after hours 7P-7A, please log into the web site [email or web address] and access using universal [place] password for that web site. If you do not have the password, please call the hospital operator.  09/10/2020, 2:50 PM

## 2020-09-11 DIAGNOSIS — R2689 Other abnormalities of gait and mobility: Secondary | ICD-10-CM | POA: Diagnosis not present

## 2020-09-11 DIAGNOSIS — E1165 Type 2 diabetes mellitus with hyperglycemia: Secondary | ICD-10-CM | POA: Diagnosis not present

## 2020-09-11 DIAGNOSIS — R5381 Other malaise: Secondary | ICD-10-CM | POA: Diagnosis not present

## 2020-09-11 DIAGNOSIS — R778 Other specified abnormalities of plasma proteins: Secondary | ICD-10-CM | POA: Diagnosis not present

## 2020-09-11 DIAGNOSIS — R600 Localized edema: Secondary | ICD-10-CM | POA: Diagnosis not present

## 2020-09-11 DIAGNOSIS — M25561 Pain in right knee: Secondary | ICD-10-CM | POA: Diagnosis not present

## 2020-09-11 DIAGNOSIS — R262 Difficulty in walking, not elsewhere classified: Secondary | ICD-10-CM | POA: Diagnosis not present

## 2020-09-11 DIAGNOSIS — E872 Acidosis: Secondary | ICD-10-CM | POA: Diagnosis not present

## 2020-09-11 DIAGNOSIS — M25532 Pain in left wrist: Secondary | ICD-10-CM | POA: Diagnosis not present

## 2020-09-11 DIAGNOSIS — E1142 Type 2 diabetes mellitus with diabetic polyneuropathy: Secondary | ICD-10-CM | POA: Diagnosis not present

## 2020-09-11 DIAGNOSIS — K567 Ileus, unspecified: Secondary | ICD-10-CM | POA: Diagnosis not present

## 2020-09-11 DIAGNOSIS — M79605 Pain in left leg: Secondary | ICD-10-CM | POA: Diagnosis not present

## 2020-09-11 DIAGNOSIS — M79642 Pain in left hand: Secondary | ICD-10-CM | POA: Diagnosis not present

## 2020-09-11 DIAGNOSIS — Z7901 Long term (current) use of anticoagulants: Secondary | ICD-10-CM | POA: Diagnosis not present

## 2020-09-11 DIAGNOSIS — M1000 Idiopathic gout, unspecified site: Secondary | ICD-10-CM | POA: Diagnosis not present

## 2020-09-11 DIAGNOSIS — M6281 Muscle weakness (generalized): Secondary | ICD-10-CM | POA: Diagnosis not present

## 2020-09-11 DIAGNOSIS — M2550 Pain in unspecified joint: Secondary | ICD-10-CM | POA: Diagnosis not present

## 2020-09-11 DIAGNOSIS — M79604 Pain in right leg: Secondary | ICD-10-CM | POA: Diagnosis not present

## 2020-09-11 DIAGNOSIS — Z7401 Bed confinement status: Secondary | ICD-10-CM | POA: Diagnosis not present

## 2020-09-11 DIAGNOSIS — I82622 Acute embolism and thrombosis of deep veins of left upper extremity: Secondary | ICD-10-CM | POA: Diagnosis not present

## 2020-09-11 DIAGNOSIS — E876 Hypokalemia: Secondary | ICD-10-CM | POA: Diagnosis not present

## 2020-09-11 DIAGNOSIS — M109 Gout, unspecified: Secondary | ICD-10-CM | POA: Diagnosis not present

## 2020-09-11 DIAGNOSIS — K219 Gastro-esophageal reflux disease without esophagitis: Secondary | ICD-10-CM | POA: Diagnosis not present

## 2020-09-11 DIAGNOSIS — M10042 Idiopathic gout, left hand: Secondary | ICD-10-CM | POA: Diagnosis not present

## 2020-09-11 DIAGNOSIS — N182 Chronic kidney disease, stage 2 (mild): Secondary | ICD-10-CM | POA: Diagnosis not present

## 2020-09-11 DIAGNOSIS — E1140 Type 2 diabetes mellitus with diabetic neuropathy, unspecified: Secondary | ICD-10-CM | POA: Diagnosis not present

## 2020-09-11 DIAGNOSIS — I10 Essential (primary) hypertension: Secondary | ICD-10-CM | POA: Diagnosis not present

## 2020-09-11 LAB — GLUCOSE, CAPILLARY
Glucose-Capillary: 119 mg/dL — ABNORMAL HIGH (ref 70–99)
Glucose-Capillary: 125 mg/dL — ABNORMAL HIGH (ref 70–99)
Glucose-Capillary: 110 mg/dL — ABNORMAL HIGH (ref 70–99)
Glucose-Capillary: 171 mg/dL — ABNORMAL HIGH (ref 70–99)

## 2020-09-11 LAB — RESPIRATORY PANEL BY RT PCR (FLU A&B, COVID)
Influenza A by PCR: NEGATIVE
Influenza B by PCR: NEGATIVE
SARS Coronavirus 2 by RT PCR: NEGATIVE

## 2020-09-11 MED ORDER — METOPROLOL SUCCINATE ER 50 MG PO TB24: 75.0000 mg | ORAL_TABLET | Freq: Every day | ORAL | Status: AC

## 2020-09-11 MED ORDER — KATE FARMS STANDARD 1.4 PO LIQD: 325.0000 mL | Freq: Every day | ORAL | Status: AC

## 2020-09-11 MED ORDER — POLYETHYLENE GLYCOL 3350 17 G PO PACK: 17.0000 g | PACK | Freq: Every day | ORAL | Status: AC

## 2020-09-11 MED ORDER — AMLODIPINE BESYLATE 10 MG PO TABS: 10.0000 mg | ORAL_TABLET | Freq: Every day | ORAL | Status: AC

## 2020-09-11 MED ORDER — PANTOPRAZOLE SODIUM 40 MG PO TBEC: 40.0000 mg | DELAYED_RELEASE_TABLET | Freq: Every day | ORAL | Status: AC

## 2020-09-11 MED ORDER — INSULIN GLARGINE 100 UNIT/ML ~~LOC~~ SOLN: 7.0000 [IU] | Freq: Every day | SUBCUTANEOUS | Status: DC

## 2020-09-11 MED ORDER — PROSOURCE PLUS PO LIQD: 30.0000 mL | Freq: Two times a day (BID) | ORAL | Status: AC

## 2020-09-11 MED ORDER — INSULIN ASPART 100 UNIT/ML ~~LOC~~ SOLN
0.0000 [IU] | Freq: Three times a day (TID) | SUBCUTANEOUS | Status: AC
Start: 2020-09-11 — End: ?

## 2020-09-11 MED ORDER — ADULT MULTIVITAMIN W/MINERALS CH: 1.0000 | ORAL_TABLET | Freq: Every day | ORAL | Status: AC

## 2020-09-11 NOTE — Discharge Summary (Signed)
Physician Discharge Summary  [name] BZM:[id number] DOB: 1961-07-14  PCP: Charlott Rakes, MD  Admitted from: Home Discharged to: SNF  Admit date: 08/10/2020 Discharge date: 09/11/2020  Recommendations for Outpatient Follow-up:    Contact information for follow-up providers    MD at SNF. Schedule an appointment as soon as possible for a visit.   Why: To be seen in 2 to 3 days with repeat labs (CBC, CMP & Mg).       Charlott Rakes, MD. Schedule an appointment as soon as possible for a visit.   Specialty: Family Medicine Why: Upon discharge from SNF. Contact information: [place] [address] [phone]            Contact information for after-discharge care    Destination    [facility] Preferred SNF .   Service: Skilled Nursing Contact information: [address] [place]: N/A    Equipment/Devices: TBD at SNF    Discharge Condition: Improved and stable.   Code Status: Full Code Diet recommendation:  Discharge Diet Orders (From admission, onward)    Start     Ordered   09/11/20 0000  Diet - low sodium heart healthy        09/11/20 1321   09/11/20 0000  Diet Carb Modified        09/11/20 1321           Discharge Diagnoses:  Principal Problem:   Ileus ([place]) Active Problems:   Essential hypertension   Gout   Iron deficiency anemia   Elevated troponin   Generalized weakness   Uncontrolled type 2 diabetes mellitus with hyperglycemia, without long-term current use of insulin (HCC)   Acute deep vein thrombosis (DVT) of brachial vein of left upper extremity (HCC)   Hypokalemia   CKD (chronic kidney disease) stage 2, GFR 60-89 ml/min   Polyarticular gout   Nasogastric tube present   Acidosis, metabolic   Sepsis due to Escherichia coli without acute organ dysfunction (HCC)   Brief Summary:  HPI below as per Dr. Inda Merlin on  08/11/2020:  "59 year old male with past medical history of polyarticular gout, insulin-dependent diabetes mellitus type 2, iron deficiency anemia, hypertension and recent diagnosis of left upper extremity DVT (06/2020) presenting with generalized malaise and weakness.  Of note, patient was recently hospitalized from 8/11-8/24 at [facility] for acute kidney injury with a hospital stay that was complicated by the identification of a left upper extremity DVT as well as identification of polyarticular gout and iron deficiency anemia. Patient was discharged to a skilled nursing facility.  Since patient has been home, patient explains that he has been developing generalized worsening weakness. This weakness was initially mild in intensity but has progressively become more severe. This is associated with poor appetite. Patient states that his symptoms continue to worsen and approximately 3 days ago he began to develop difficulty with urination. He states that it has become progressively more difficult to urinate. He does not endorse associated dysuria or low back pain however. Patient also states that his urine has left "really dark" for the past 3 days.  It is worth noting that the patient presented to [facility] long emergency department on 9/21 with left upper extremity pain. At that time it was identified that patient was not taking his Eliquis since discharge from his skilled nursing facility.  Patient was placed back on Eliquis after a repeat left upper extremity ultrasound revealed no propagation or enlargement of the DVT.  Due to patient's progressively worsening symptoms he eventually presented to [facility] emergency department for evaluation. Upon evaluation in the emergency department patient was found to be exhibiting multiple SIRS criteria concerning for infectious process. Patient was found to be febrile on arrival of 102.8 F. CT angiogram of the chest revealed no evidence of pulmonary  embolism or pneumonia. Blood cultures were obtained. Urinalysis was ordered and is pending. Due to patient's persisting generalized weakness and multiple SIRS criteria without obvious source the hospitalist group has been called to assess the patient for admission to the hospital."  He also had PMH of scrotal abscess drained in 2017, severe onychomycosis, severe tricompartmental osteoarthritis of both knees, s/p bilateral arthroscopies with meniscectomies 2017.  He had work-up in the past found to be ANA negative, rheumatoid factor negative, CCP negative and felt to be a combination of his chronic gout and possible osteoarthritis.  Assessment and plan:  Sepsis secondary to E. coli UTI, POA followed by E. coli/Pseudomonas UTI. Met SIRS criteria on admission.  No clearly identified source of infection on admission.  Some concern for complicated UTI given difficulty urination.  Treated with IV fluids.  Cultures obtained.  CTA chest without evidence of pneumonia.  Although urine analysis showed no leukocytosis and negative nitrates, urine culture grew E. coli.  Initially started on oral Keflex.  Keflex was then changed to IV ceftriaxone while he was n.p.o..  IV ceftriaxone then was changed to Ancef to cover for abdominal distention/GI pathologies.  He completed 7 to 8 days of antibiotics and these were eventually discontinued.  Developed recurrent fever on 10/9, UA indicative of UTI, ceftriaxone again started as cultures were pending.  E. coli was pansensitive.  Azithromycin was then added to cover for possible pneumonia.  Urine culture were then positive for E. coli and Pseudomonas aeruginosa.  He was then started on IV cefepime and completed a week's course.  Sepsis resolved.  Elevated troponin: Suspected demand ischemia.  No chest pain on admission.  Generalized weakness/prior bilateral knee surgeries: Therapies evaluated during course of hospital admission and recommended SNF.  Essential  hypertension: Metoprolol XL dose was increased.  Amlodipine was newly added with improved control.  Mildly uncontrolled at times, adjust antihypertensives further at SNF as deemed necessary.  Polyarticular gout with osteoarthritis. No acute flare on admission.  Home regimen of allopurinol continued.  Early on in admission there was concern for flare of his polyarticular gout.  He had been treated at nursing facility with colchicine.  Allopurinol was held which could worsen pre-existing gout.  Prednisone 40 mg daily and colchicine started early on in admission.  He had multiple joint pains but predominantly right shoulder pain.  Prior work-up with rheumatoid factor, ANA and CCP negative.  Patient was then placed on IV Solu-Medrol for recurrent flare/acute gouty flare of right hand.  Acute flare resolved and steroids were discontinued.  Resume allopurinol at discharge.  Outpatient follow-up with rheumatology recommended.  Iron deficiency anemia/anemia of chronic disease: Stable on admission compared to recent values.  No overt bleeding on admission.  Had been seen by GI during recent hospitalization and had recommended outpatient endoscopic evaluation.  Home iron supplements were continued.  Treated with IV iron/25/21.  His hemoglobin gradually drifted down.  He had prior heme positive stools on 06/28/2020.  Patient was able to consistently tolerate oral intake, he underwent bowel  prep and EGD colonoscopy. EGD showed portal hypertensive gastropathy and colonoscopy was normal on 09/07/2020.  Hemoglobin stable in the 8 g range.  Resume oral iron supplements at discharge and may increase dose at SNF.  Follow CBC periodically at SNF.  Uncontrolled type II DM with hyperglycemia, complicated by peripheral neuropathy. He had not been on any oral hypoglycemics or insulins prior to this admission.  Started on insulins with close CBG monitoring.  Recent A1c: 7.1.  There was some concern regarding noncompliance of  insulins at home.  Reglan was initially continued for presumed diabetic gastroparesis.  Continue current dose of Lantus and SSI and adjust as needed based on close CBG monitoring.  Acute DVT of left brachial vein: Seen in [facility] ED on 9/21 with severe left upper extremity pain.  He had been noncompliant with Eliquis treatment since discharge from SNF several weeks ago.  He was restarted on Eliquis.  Had some ongoing left upper extremity edema and pain.  Duration of anticoagulation to be determined during outpatient follow-up by PCP-?  3 to 6 months.  While patient's bowels were rested for GI work-up related to his ileus etc., he was transitioned to IV heparin.  IV heparin was then transitioned to full dose Lovenox.  After patient was tolerating diet well and post EGD/colonoscopy, patient has been transitioned back to home Eliquis.  Nausea and vomiting: Initially felt to be due to gastroparesis or constipation.  Resolved.  CKD stage II Stable.  Hypokalemia/hypomagnesemia: These were closely monitored and replaced aggressively as needed.  Follow BMP and magnesium periodically as outpatient.  Prolonged colonic and small bowel ileus: Abdominal x-ray on 08/14/2020 were concerning for SBO.  General surgery and GI were consulted on 9/27 due to concern for this.  Felt initially due to acute illness, recent reduced mobility, narcotic use.  They recommended minimizing narcotics, replacing electrolytes and GI did not feel that he warranted a decompressive colonoscopy at that time.  Although he had evidence of pneumatosis on his CT scan, his abdominal exam was completely benign along with a normal WBC and no further fevers.  No acute surgical intervention was deemed necessary.  His bowels were rested and serial abdominal x-rays were followed.  Clinically there was no evidence of bowel ischemia.  He was placed on scheduled Reglan.  Abdominal had been "massively distended".  Rectal tube was not felt to  be very beneficial.  He initially had colonic ileus which then involved the small bowel.  At this point an NG tube was placed by gastroenterology using a guidewire and connected to low intermittent suctioning.  GI did not feel that there was a role for decompressive scope.  He was tried on neostigmine which apparently caused diarrhea and vomiting.  Rectal tube was attempted during which time he had a lot of liquid stool come out and it was not left in place.  Due to n.p.o., PICC line was placed and patient was started on TPN.  GI and general surgery continue to closely follow patient.  He was treated supportively with all measures noted above.  His NG tube was clamped and eventually removed.  Patient was passing flatus and BM.  Diet was gradually started and very slowly advanced.  After he was tolerating diet well, his TPN was weaned down and discontinued.  For almost a week now, patient has tolerated regular consistency diet without nausea, vomiting or abdominal pain.  He has been having regular BMs.  Ileus has resolved.  Apart from his  sepsis, this was one of the major things that complicated this hospital admission.  Pneumatosis: Noted on CT abdomen.  GI and general surgery seen.  This was felt to be benign.  Metabolic acidosis: At one point there was concern for abdominal ischemia due to significant colonic ileus.  He was started on bicarbonate drip.  Lactate was normal.  Resolved.  QT prolongation  Improved.  EKG 08/22/2020: QTC 483 ms.  Monitor EKG periodically and avoid QT prolonging medications.  Abnormal LFTs:  Mild and improving.  Follow LFTs as outpatient.  Body mass index is 38.02 kg/m./Obesity  Nutritional Status Nutrition Problem: Inadequate oral intake Etiology: acute illness Signs/Symptoms: other (comment) (FLD unable to provide adequate needs) Interventions: TPN, Other (Comment) [name] 1.4 po; Prosource Plus)  Severe onychogryphosis  Microscopic hematuria: Recommend  repeating urine microscopy in 2 to 3 weeks and if persistent consider further evaluation as outpatient which may include a urology consultation.   Consultations:  Gastroenterology  General surgery  Procedures: As noted above.  NG tube has been removed several days ago. PICC line removed on day of discharge.  EGD 09/07/2020:  Impression: - Normal esophagus. - Portal hypertensive gastropathy. - Normal examined duodenum. - No specimens collected.  Colonoscopy 09/07/2020:  Impression: - The entire examined colon is normal. - No specimens collected.    Discharge Instructions  Discharge Instructions    Call MD for:  difficulty breathing, headache or visual disturbances   Complete by: As directed    Call MD for:  extreme fatigue   Complete by: As directed    Call MD for:  persistant dizziness or light-headedness   Complete by: As directed    Call MD for:  persistant nausea and vomiting   Complete by: As directed    Call MD for:  severe uncontrolled pain   Complete by: As directed    Call MD for:  temperature >100.4   Complete by: As directed    Diet - low sodium heart healthy   Complete by: As directed    Diet Carb Modified   Complete by: As directed    Increase activity slowly   Complete by: As directed        Medication List    STOP taking these medications   cyclobenzaprine 10 MG tablet Commonly known as: FLEXERIL   diclofenac Sodium 1 % Gel Commonly known as: VOLTAREN   Droplet Pen Needles 32G X 4 MM Misc Generic drug: Insulin Pen Needle   metoCLOPramide 5 MG/5ML solution Commonly known as: REGLAN   NON FORMULARY   Pen Needles 30G X 8 MM Misc   traZODone 50 MG tablet Commonly known as: DESYREL     TAKE these medications   (feeding supplement) PROSource Plus liquid Take 30 mLs by mouth 2 (two) times daily between meals. Notes to patient: 09/11/20   feeding supplement ([employer] [place] STANDARD 1.4) Liqd liquid Take 325 mLs by mouth  daily. Notes to patient: 09/11/20   Accu-Chek Aviva Plus test strip Generic drug: glucose blood USE AS INSTRUCTED THREE TIMES DAILY. E11.69   Accu-Chek Aviva Plus w/Device Kit USE AS INSTRUCTED THREE TIMES DAILY. E11.69   Accu-Chek Aviva Soln Use to calibrate your glucometer when needed.   Accu-Chek FastClix Lancets Misc 1 each by Does not apply route 3 (three) times daily. as directed   allopurinol 300 MG tablet Commonly known as: ZYLOPRIM Take 1 tablet (300 mg total) by mouth daily.   amLODipine 10 MG tablet Commonly known as: NORVASC Take  1 tablet (10 mg total) by mouth daily. Start taking on: September 12, 2020 Notes to patient: 09/12/20   apixaban 5 MG Tabs tablet Commonly known as: ELIQUIS Take 1 tablet (5 mg total) by mouth 2 (two) times daily. What changed: Another medication with the same name was removed. Continue taking this medication, and follow the directions you see here. Notes to patient: 09/11/20   atorvastatin 40 MG tablet Commonly known as: LIPITOR Take 1 tablet (40 mg total) by mouth daily at 6 PM. Notes to patient: 09/11/20   B-D SINGLE USE SWABS REGULAR Pads Please use as instructed before checking blood sugar and before injecting insulin.   ferrous sulfate 325 (65 FE) MG tablet Take 1 tablet (325 mg total) by mouth daily with breakfast. For Iron Deficiency Anemia Notes to patient: 09/11/20   insulin aspart 100 UNIT/ML injection Commonly known as: novoLOG Inject 0-15 Units into the skin 3 (three) times daily with meals. CBG < 70: Implement Hypoglycemia Standing Orders and refer to Hypoglycemia Standing Orders sidebar report CBG 70 - 120: 0 units CBG 121 - 150: 2 units CBG 151 - 200: 3 units CBG 201 - 250: 5 units CBG 251 - 300: 8 units CBG 301 - 350: 11 units CBG 351 - 400: 15 units CBG > 400: call MD. Notes to patient: 09/11/20   insulin glargine 100 UNIT/ML injection Commonly known as: LANTUS Inject 0.07 mLs (7 Units total) into the  skin at bedtime. Notes to patient: 09/11/20   Magnesium Oxide 200 MG Tabs Take 2 tablets (400 mg total) by mouth daily. Take 2 tabs to = 400 mg Notes to patient: 09/11/20   metoprolol succinate 50 MG 24 hr tablet Commonly known as: TOPROL-XL Take 1.5 tablets (75 mg total) by mouth daily. Take with or immediately following a meal. Start taking on: September 12, 2020 What changed: how much to take Notes to patient: 09/12/20   multivitamin with minerals Tabs tablet Take 1 tablet by mouth daily. Start taking on: September 12, 2020 Notes to patient: 09/12/20   pantoprazole 40 MG tablet Commonly known as: PROTONIX Take 1 tablet (40 mg total) by mouth daily. Start taking on: September 12, 2020 What changed: when to take this Notes to patient: 09/12/20   polyethylene glycol 17 g packet Commonly known as: MIRALAX / GLYCOLAX Take 17 g by mouth daily. Start taking on: September 12, 2020 Notes to patient: 09/12/20      Allergies  Allergen Reactions  . Hctz [Hydrochlorothiazide]     Hx of gout; uric acid 14.1 on 03/13/20 Acute gout L elbow 8/11-8/24/21      Procedures/Studies: DG Chest 2 View  Result Date: 08/31/2020 CLINICAL DATA:  Fever EXAM: CHEST - 2 VIEW COMPARISON:  August 29, 2020 FINDINGS: Lungs are clear. Heart is borderline enlarged with pulmonary vascularity normal. No adenopathy. Central catheter tip is in the superior vena cava. No pneumothorax. There is degenerative change in the thoracic spine. IMPRESSION: Borderline cardiac enlargement. Lungs clear. Central catheter tip in superior vena cava. Electronically Signed   By: Lowella Grip III M.D.   On: 08/31/2020 10:19   DG Abd 1 View  Result Date: 08/29/2020 CLINICAL DATA:  Fever.  Follow-up stent bowel. EXAM: ABDOMEN - 1 VIEW COMPARISON:  Abdominal radiographs 08/26/2020, 08/22/2020, and 08/21/2020. FINDINGS: Persistent dilated loops of small bowel are present. There is some gas in the colon. No definite free air is  present. Decubitus views would be more helpful for assessment of fluid levels or  free air. Bowel distention is increasing. IMPRESSION: 1. Persistent dilated loops of small bowel, compatible with small bowel obstruction. 2. No definite free air. Electronically Signed   By: San Morelle M.D.   On: 08/29/2020 10:40   DG Abd 1 View  Result Date: 08/26/2020 CLINICAL DATA:  Distension EXAM: ABDOMEN - 1 VIEW COMPARISON:  August 22, 2020 FINDINGS: There is unchanged gaseous dilation of loops of large and small bowel. Enteric tube is been removed. Visualized lung bases are unremarkable. Degenerative changes of the lower lumbar spine. IMPRESSION: Unchanged gaseous dilation of loops of large and small bowel. Electronically Signed   By: Valentino Saxon MD   On: 08/26/2020 16:04   DG Abd 1 View  Result Date: 08/22/2020 CLINICAL DATA:  Follow-up ileus. EXAM: ABDOMEN - 1 VIEW COMPARISON:  08/21/2020. FINDINGS: NG tube noted with its tip again noted over the duodenum. Distended loops of small and large bowel again noted. Similar findings on prior exam. Findings most consistent adynamic ileus. Follow-up exam suggested to demonstrate resolution and to exclude bowel obstruction. Degenerative change thoracolumbar spine. IMPRESSION: NG tube noted with its tip again noted over the duodenum. Distended loops of small and large bowel again noted. Similar findings on prior exam. Findings most consistent adynamic ileus. Follow-up exam suggested to demonstrate resolution and to exclude bowel obstruction. Electronically Signed   By: Marcello Moores  Register   On: 08/22/2020 08:30   DG Abd 1 View  Result Date: 08/21/2020 CLINICAL DATA:  Ileus EXAM: ABDOMEN - 1 VIEW COMPARISON:  08/20/2020 FINDINGS: NG tube tip appears transpyloric, in the second segment of the duodenum. Diffuse gaseous distention of small bowel and colon is similar to prior. IMPRESSION: NG tube tip is now transpyloric, in the descending duodenum. Similar diffuse  gaseous distention of small bowel and colon. Electronically Signed   By: Misty Stanley M.D.   On: 08/21/2020 06:40   DG Abd 1 View  Result Date: 08/20/2020 CLINICAL DATA:  Ileus. EXAM: ABDOMEN - 1 VIEW COMPARISON:  08/19/2020 FINDINGS: An enteric tube is unchanged with tip in the expected region of the distal stomach. Diffuse gaseous distension of small and large bowel loops is similar to the prior study. No acute osseous abnormality is identified. There is no gross consolidation in the included lung bases. IMPRESSION: Unchanged gaseous distension of small bowel and colon compatible with ileus. Electronically Signed   By: Logan Bores M.D.   On: 08/20/2020 05:53   DG Abd 1 View  Result Date: 08/19/2020 CLINICAL DATA:  Ileus. EXAM: ABDOMEN - 1 VIEW COMPARISON:  None. FINDINGS: Persistent gaseous distension of large and small bowel loops within the visualized abdomen, similar in appearance. Gastric tube is coiled within the stomach with the tip projecting peripelvic. IMPRESSION: Persistent diffuse gaseous distension of large and small bowel loops. Electronically Signed   By: Margaretha Sheffield MD   On: 08/19/2020 10:54   DG Abd 1 View  Result Date: 08/18/2020 CLINICAL DATA:  Nasogastric tube placement EXAM: ABDOMEN - 1 VIEW COMPARISON:  08/18/2020 FINDINGS: Interval placement of nasogastric tube which is coiled within the proximal stomach. Persistent diffuse gaseous distension of large and small bowel loops within the visualized abdomen, unchanged in appearance. IMPRESSION: 1. Interval placement of nasogastric tube which is coiled within the proximal stomach. 2. Persistent diffuse gaseous distension of large and small bowel loops. Electronically Signed   By: Davina Poke D.O.   On: 08/18/2020 13:17   DG Abd 1 View  Result Date: 08/18/2020 CLINICAL DATA:  Ileus EXAM: ABDOMEN - 1 VIEW COMPARISON:  08/17/2020 FINDINGS: Diffuse gas distention of large and small bowel most consistent with adynamic ileus.  No improvement since previous study. IMPRESSION: Persistent adynamic ileus. Electronically Signed   By: Lucienne Capers M.D.   On: 08/18/2020 05:48   DG Abd 1 View  Result Date: 08/17/2020 CLINICAL DATA:  Follow-up ileus. EXAM: ABDOMEN - 1 VIEW COMPARISON:  08/16/2020. FINDINGS: Diffuse colonic distention again noted without significant change. Small-bowel distention noted on today's exam. These findings are most consistent with adynamic ileus with possible progression. Colonic pneumatosis again noted no free air. IMPRESSION: Diffuse colonic distention again noted without significant change. Small-bowel distention noted on today's exam. These findings most consistent with adynamic ileus with possible progression. Colonic pneumatosis again noted. Electronically Signed   By: Marcello Moores  Register   On: 08/17/2020 10:06   CT ABDOMEN PELVIS W CONTRAST  Result Date: 08/14/2020 CLINICAL DATA:  Provided history of: Bowel obstruction suspected (Ped 5-18y) Radiologic records states abdominal pain and distension. EXAM: CT ABDOMEN AND PELVIS WITH CONTRAST TECHNIQUE: Multidetector CT imaging of the abdomen and pelvis was performed using the standard protocol following bolus administration of intravenous contrast. CONTRAST:  12m OMNIPAQUE IOHEXOL 300 MG/ML  SOLN COMPARISON:  Abdominal radiograph earlier today. FINDINGS: Lower chest: Mild cardiomegaly. Medial left lower lobe atelectasis. Trace left pleural effusions/thickening. Hepatobiliary: Diffusely decreased hepatic density consistent with steatosis. No focal hepatic lesion. High-density contents in the gallbladder likely representing sludge. There is no pericholecystic inflammation. No biliary dilatation. There is no portal venous gas. Pancreas: Fatty atrophy.  No ductal dilatation or inflammation. Spleen: Normal in size without focal abnormality. Adrenals/Urinary Tract: Mild left adrenal thickening. Normal right adrenal gland. No dominant adrenal nodule. Bilateral  renal parenchymal atrophy with lobular contours. No hydronephrosis. There is symmetric bilateral perinephric edema. Cortical low densities within both kidneys are too small to accurately characterize but likely cysts. There is excretion from both kidneys on delayed phase imaging. Urinary bladder is only minimally distended. Mild bladder wall thickening. Stomach/Bowel: There is diffuse gaseous and fluid distension of the entire colon. Greatest colonic distension in the sigmoid measures 8.3 cm. There is air in the bowel wall involving the ascending and hepatic flexure consistent with pneumatosis. No associated colonic wall thickening. Occasional pericolonic edema involving the ascending colon as well as sigmoid colon. The sigmoid colon is tortuous. There is no evidence of focal bowel mass or cause for obstruction. There is no small bowel obstruction, enteric contrast reaches the colon. No small bowel inflammation. No small bowel pneumatosis. Normal appendix. Ingested material within the stomach. Normal positioning of the duodenum and ligament of Treitz. Vascular/Lymphatic: Normal caliber abdominal aorta. Mild aorto bi-iliac atherosclerosis. The celiac and SMA arteries are patent without evidence of severe stenosis or bowel disease. There is no mesenteric venous or portal venous gas. The portal vein is patent mesenteric veins are patent. There is no evidence of mesenteric venous thrombus. No abdominopelvic adenopathy. Reproductive: Prostate is unremarkable. Other: No free air or pneumoperitoneum. There is trace free fluid in the pericolic gutters without significant ascites. No abscess. No evidence of bowel perforation. Patchy edema in the subcutaneous tissues of the flanks, right greater than left. Subcutaneous densities in the lower anterior abdominal wall typical of medication injection sites. There is a tiny fat containing umbilical hernia. Musculoskeletal: There are no acute or suspicious osseous abnormalities.  IMPRESSION: 1. Diffuse gaseous and fluid distension of the entire colon suggesting colonic ileus. Pneumatosis of the ascending and hepatic flexure of  the colon. No associated colonic wall thickening or portal venous gas. This can be seen in the setting of bowel ischemia, however there are no secondary findings of bowel inflammation nor supporting mesenteric vascular findings. Benign causes of pneumatosis have also been reported. Recommend surgical consult. 2. Normal small bowel without obstruction. 3. Hepatic steatosis. 4. Bilateral renal parenchymal atrophy with lobular contours. 5. Mild bladder wall thickening, can be seen with urinary tract infection. Aortic Atherosclerosis (ICD10-I70.0). These results were called by telephone at the time of interpretation on 08/14/2020 at 4:04 pm to provider [facility] , who verbally acknowledged these results. Electronically Signed   By: Keith Rake M.D.   On: 08/14/2020 16:04   DG CHEST PORT 1 VIEW  Result Date: 08/29/2020 CLINICAL DATA:  59 year old male with history of fever. Distended bowel. EXAM: PORTABLE CHEST 1 VIEW COMPARISON:  Chest x-ray 08/26/2020. FINDINGS: There is a right upper extremity PICC with tip terminating in the distal superior vena cava. Lung volumes are low. No consolidative airspace disease. No pleural effusions. No pneumothorax. No pulmonary nodule or mass noted. Pulmonary vasculature and the cardiomediastinal silhouette are within normal limits. Visualized portions of the upper abdomen demonstrate gaseous distension in the visualized bowel. IMPRESSION: 1. Low lung volumes without radiographic evidence of acute cardiopulmonary disease. 2. Gaseous distension of the bowel noted beneath the diaphragm. Electronically Signed   By: Vinnie Langton M.D.   On: 08/29/2020 10:44   DG CHEST PORT 1 VIEW  Result Date: 08/26/2020 CLINICAL DATA:  Fever EXAM: PORTABLE CHEST 1 VIEW COMPARISON:  August 10, 2020. FINDINGS: The cardiomediastinal  silhouette is unchanged in contour.RIGHT upper extremity PICC tip terminates over the SVC. No pleural effusion. No pneumothorax. No acute pleuroparenchymal abnormality. Partial visualization of gaseous dilation of loops of bowel. No acute osseous abnormality. IMPRESSION: 1. No acute cardiopulmonary abnormality. 2. Partial visualization of gaseous dilation of loops of bowel. Electronically Signed   By: Valentino Saxon MD   On: 08/26/2020 16:03   DG Abd 2 Views  Result Date: 09/01/2020 CLINICAL DATA:  Ileus. EXAM: ABDOMEN - 2 VIEW COMPARISON:  August 29, 2020. FINDINGS: Stable dilated large and small bowel loops are noted concerning for ileus. There is no evidence of free air. No radio-opaque calculi or other significant radiographic abnormality is seen. IMPRESSION: Stable dilated large and small bowel loops are noted concerning for ileus. Electronically Signed   By: Marijo Conception M.D.   On: 09/01/2020 12:31   DG Abd 2 Views  Result Date: 08/14/2020 CLINICAL DATA:  Abdominal pain and distension EXAM: X-RAY ABDOMEN 2 VIEWS COMPARISON:  None. FINDINGS: Supine and lateral decubitus images obtained. There is generalized bowel dilatation with multiple air-fluid levels. No free air appreciable. No abnormal calcifications. IMPRESSION: Diffuse bowel dilatation with multiple air-fluid levels. Question severe ileus versus a degree of distal bowel obstruction. No free air appreciable. These results will be called to the ordering clinician or representative by the Radiologist Assistant, and communication documented in the PACS or [employer]. Electronically Signed   By: Lowella Grip III M.D.   On: 08/14/2020 08:42   DG Abd Portable 1V  Result Date: 08/16/2020 CLINICAL DATA:  Ileus. EXAM: PORTABLE ABDOMEN - 1 VIEW COMPARISON:  August 14, 2020. FINDINGS: Stable diffuse colonic dilatation is noted with stool seen in the right colon. No definite small bowel dilatation is noted. These findings are most  consistent with colonic ileus. No abnormal calcifications are noted. IMPRESSION: Stable diffuse colonic dilatation is noted most consistent with  colonic ileus. Electronically Signed   By: Marijo Conception M.D.   On: 08/16/2020 10:02   [place] EKG SITE RITE  Result Date: 08/21/2020 If Site Rite image not attached, placement could not be confirmed due to current cardiac rhythm.     Subjective: Patient has had no complaints for last several days.  Tolerating diet well without nausea, vomiting, abdominal pain or pain elsewhere.  Having regular BMs.  No dyspnea, chest pain or palpitations.  Discharge Exam:  Vitals:   09/11/20 0437 09/11/20 0845 09/11/20 0846 09/11/20 1404  BP: 126/72 (!) 144/99 (!) 144/99 (!) 144/82  Pulse: 76 76  66  Resp: 18   18  Temp: 99.1 F (37.3 C)   98.7 F (37.1 C)  TempSrc: Oral   Oral  SpO2: 98%   100%  Weight:      Height:        General exam: Pleasant middle-age male, moderately built and obese sitting up comfortably in chair this morning. Respiratory system: Clear to auscultation.  No increased work of breathing. Cardiovascular system: S1 & S2 heard, RRR. No JVD, murmurs, rubs, gallops or clicks.  Trace bilateral leg and distal upper extremity edema.  Stable. Gastrointestinal system: Abdomen is obese/nondistended, soft and nontender.  No organomegaly or masses appreciated.  Normal bowel sounds heard. Central nervous system: Alert and oriented. No focal neurological deficits. Extremities: Symmetric 5 x 5 power. Skin: No rashes, lesions or ulcers Psychiatry: Judgement and insight appear normal. Mood & affect appropriate.    The results of significant diagnostics from this hospitalization (including imaging, microbiology, ancillary and laboratory) are listed below for reference.     Microbiology: Recent Results (from the past 240 hour(s))  Respiratory Panel by RT PCR (Flu A&B, Covid) - Nasopharyngeal Swab     Status: None   Collection Time: 09/11/20 12:11  PM   Specimen: Nasopharyngeal Swab  Result Value Ref Range Status   SARS Coronavirus 2 by RT PCR NEGATIVE NEGATIVE Final    Comment: (NOTE) SARS-CoV-2 target nucleic acids are NOT DETECTED.  The SARS-CoV-2 RNA is generally detectable in upper respiratoy specimens during the acute phase of infection. The lowest concentration of SARS-CoV-2 viral copies this assay can detect is 131 copies/mL. A negative result does not preclude SARS-Cov-2 infection and should not be used as the sole basis for treatment or other patient management decisions. A negative result may occur with  improper specimen collection/handling, submission of specimen other than nasopharyngeal swab, presence of viral mutation(s) within the areas targeted by this assay, and inadequate number of viral copies (<131 copies/mL). A negative result must be combined with clinical observations, patient history, and epidemiological information. The expected result is Negative.  Fact Sheet for Patients:  PinkCheek.be  Fact Sheet for Healthcare Providers:  GravelBags.it  This test is no t yet approved or cleared by the [place] FDA and  has been authorized for detection and/or diagnosis of SARS-CoV-2 by FDA under an Emergency Use Authorization (EUA). This EUA will remain  in effect (meaning this test can be used) for the duration of the COVID-19 declaration under Section 564(b)(1) of the Act, 21 U.S.C. section 360bbb-3(b)(1), unless the authorization is terminated or revoked sooner.     Influenza A by PCR NEGATIVE NEGATIVE Final   Influenza B by PCR NEGATIVE NEGATIVE Final    Comment: (NOTE) The Xpert Xpress SARS-CoV-2/FLU/RSV assay is intended as an aid in  the diagnosis of influenza from Nasopharyngeal swab specimens and  should not be used  as a sole basis for treatment. Nasal washings and  aspirates are unacceptable for Xpert Xpress SARS-CoV-2/FLU/RSV   testing.  Fact Sheet for Patients: PinkCheek.be  Fact Sheet for Healthcare Providers: GravelBags.it  This test is not yet approved or cleared by the [place] FDA and  has been authorized for detection and/or diagnosis of SARS-CoV-2 by  FDA under an Emergency Use Authorization (EUA). This EUA will remain  in effect (meaning this test can be used) for the duration of the  Covid-19 declaration under Section 564(b)(1) of the Act, 21  U.S.C. section 360bbb-3(b)(1), unless the authorization is  terminated or revoked. Performed at [facility], [place] [address], [place], [place] [address]      Labs: CBC: Recent Labs  Lab 09/05/20 0506 09/06/20 0415 09/07/20 0100 09/08/20 0426 09/09/20 0430  WBC 9.5 7.7 6.8 6.5 4.5  HGB 8.6* 8.4* 8.7* 8.1* 8.1*  HCT 28.4* 27.5* 28.4* 26.8* 27.3*  MCV 78.9* 79.5* 80.5 81.7 82.5  PLT 306 281 264 256 938    Basic Metabolic Panel: Recent Labs  Lab 09/05/20 0506 09/06/20 0415 09/07/20 0100 09/08/20 0426 09/09/20 0430  NA 135 134* 136 140  --   K 4.1 4.1 3.7 3.5  --   CL 95* 97* 99 105  --   CO2 '30 30 28 27  ' --   GLUCOSE 156* 138* 114* 124*  --   BUN 21* '18 13 15  ' --   CREATININE 0.67 0.66 0.64 0.72  --   CALCIUM 8.2* 8.2* 8.2* 8.0*  --   MG 1.7 2.2 1.8 1.7 1.7  PHOS 3.0 3.9  --   --   --     Liver Function Tests: Recent Labs  Lab 09/06/20 0415 09/07/20 0100 09/08/20 0426  AST  --  44* 25  ALT  --  101* 71*  ALKPHOS  --  84 76  BILITOT  --  0.7 0.7  PROT  --  5.8* 5.3*  ALBUMIN 2.5* 2.6* 2.6*    CBG: Recent Labs  Lab 09/10/20 1149 09/10/20 1710 09/10/20 2132 09/11/20 0806 09/11/20 1136  GLUCAP 118* 91 167* 119* 125*   Urinalysis    Component Value Date/Time   COLORURINE YELLOW 08/26/2020 1744   APPEARANCEUR HAZY (A) 08/26/2020 1744   LABSPEC 1.014 08/26/2020 1744   PHURINE 5.0 08/26/2020 1744   GLUCOSEU NEGATIVE 08/26/2020 1744    HGBUR SMALL (A) 08/26/2020 1744   BILIRUBINUR NEGATIVE 08/26/2020 1744   KETONESUR NEGATIVE 08/26/2020 1744   PROTEINUR NEGATIVE 08/26/2020 1744   UROBILINOGEN 0.2 06/14/2011 2309   NITRITE POSITIVE (A) 08/26/2020 1744   LEUKOCYTESUR MODERATE (A) 08/26/2020 1744      Time coordinating discharge: 60 minutes  SIGNED:  Vernell Leep, MD, [place], [facility]. [facility]  To contact the attending provider between 7A-7P or the covering provider during after hours 7P-7A, please log into the web site [email or web address] and access using universal [place] password for that web site. If you do not have the password, please call the hospital operator.

## 2020-09-11 NOTE — TOC Transition Note (Addendum)
Transition of Care [facility]) - CM/SW Discharge Note   Patient Details  Name: [name] MRN: [id number] Date of Birth: Nov 12, 1961  Transition of Care [facility]) CM/SW Contact:  Dessa Phi, RN Phone Number: 09/11/2020, 11:39 AM   Clinical Narrative:  [place] can accept today, have Josem Kaufmann #[phone]. Await covid test, & d/c summary getting PTAR set up-will call.  3:17p-d/c summary snet to [facility]-going to rm#[phone],tel# for nsg report [phone]. Nsg aware to call PTAR when ready as will call. No further CM needs.    Final next level of care: Skilled Nursing Facility Barriers to Discharge: No Barriers Identified   Patient Goals and CMS Choice Patient states their goals for this hospitalization and ongoing recovery are:: go to rehab CMS Medicare.gov Compare Post Acute Care list provided to:: Patient Choice offered to / list presented to : Patient  Discharge Placement PASRR number recieved: 08/16/20            Patient chooses bed at: [facility] Patient to be transferred to facility by: [place] Name of family member notified: Jeanett Schlein [phone] Patient and family notified of of transfer: 09/11/20  Discharge Plan and Services   Discharge Planning Services: CM Consult Post Acute Care Choice: [place]                               Social Determinants of Health (SDOH) Interventions     Readmission Risk Interventions No flowsheet data found.

## 2020-09-11 NOTE — Progress Notes (Signed)
Physical Therapy Treatment Patient Details Name: [name] MRN: [id number] DOB: [birth year] Today's Date: 09/11/2020    History of Present Illness 59 yo male admitted to [facility] on 9/24 with weakness, joint pain, difficulty urinating. Pt meeting SIRS criteria, possibly secondary to urinary infection. NGT placed 10/1 for ileus. Now clamped. PMHx significant for chronic gouty arthritis of multiple joints, CKD II, IDDM, HTN, HLD, and chronic anemia. Admitted to [facility] 8/11-8/24 for AKI, LUE DVT, and joint pain, d/c SNF.    PT Comments    Patient progressing well with acute PT and was able to progress gait distance to ~160' with no rest break required throughout. Patient now requires min guard for transfers with RW and cues needed for safe hand placement/walker management during gait. Educated pt on functional LE strengthening with repeated sit<>stands. Patient initially using momentum for power up but advanced to power up with no use of momentum and no UE use. Acute PT will continue to progress as able; updated frequency to 3x/week as pt is improving strength and activity tolerance.     Follow Up Recommendations  SNF     Equipment Recommendations  None recommended by PT    Recommendations for Other Services       Precautions / Restrictions Precautions Precautions: Fall Precaution Comments: multiple lines, watch HR Restrictions Weight Bearing Restrictions: No    Mobility  Bed Mobility               General bed mobility comments: pt OOB in recliner  Transfers Overall transfer level: Needs assistance Equipment used: Rolling walker (2 wheeled) Transfers: Sit to/from Stand Sit to Stand: Min guard         General transfer comment: cues for safe hand placement on RW, guarding for safety, no assist needed for rise.  Ambulation/Gait Ambulation/Gait assistance: Min guard;Min assist Gait Distance (Feet): 160 Feet Assistive device: Rolling walker (2 wheeled) Gait  Pattern/deviations: Step-through pattern;Decreased stride length;Wide base of support Gait velocity: decr   General Gait Details: Cues required for safe hand placement on RW and safe proximity to RW. No overt LOB noted. VC's for scanning environment and cues for posture. Pt fatigued at EOS, HR in 110's-120's during gait, recovered to 90's within 30-60 seconds.   Stairs             Wheelchair Mobility    Modified Rankin (Stroke Patients Only)       Balance Overall balance assessment: Needs assistance Sitting-balance support: Feet supported;No upper extremity supported Sitting balance-Leahy Scale: Good     Standing balance support: Bilateral upper extremity supported Standing balance-Leahy Scale: Fair                              Cognition Arousal/Alertness: Awake/alert Behavior During Therapy: Flat affect;WFL for tasks assessed/performed Overall Cognitive Status: Within Functional Limits for tasks assessed                                 General Comments: pt pleasant, sitting in recliner at start of session. Oriented x4 and reports still interested in going to rehab.      Exercises Other Exercises Other Exercises: 2x10 reps Sit<>Stand from recliner. 1st set pt using UE's for power up. Second set pt with UE's across chest, able to rise with LE only.    General Comments        Pertinent Vitals/Pain Pain  Assessment: No/denies pain    Home Living                      Prior Function            PT Goals (current goals can now be found in the care plan section) Acute Rehab PT Goals Patient Stated Goal: get stronger PT Goal Formulation: With patient Time For Goal Achievement: 09/15/20 Potential to Achieve Goals: Good Progress towards PT goals: Progressing toward goals    Frequency    Min 3X/week      PT Plan Current plan remains appropriate    Co-evaluation              AM-PAC PT "6 Clicks" Mobility    Outcome Measure  Help needed turning from your back to your side while in a flat bed without using bedrails?: A Little Help needed moving from lying on your back to sitting on the side of a flat bed without using bedrails?: A Little Help needed moving to and from a bed to a chair (including a wheelchair)?: A Little Help needed standing up from a chair using your arms (e.g., wheelchair or bedside chair)?: A Little Help needed to walk in hospital room?: A Little Help needed climbing 3-5 steps with a railing? : A Little 6 Click Score: 18    End of Session Equipment Utilized During Treatment: Gait belt Activity Tolerance: Patient tolerated treatment well Patient left: in chair;with call bell/phone within reach;with chair alarm set Nurse Communication: Mobility status PT Visit Diagnosis: Muscle weakness (generalized) (M62.81);Difficulty in walking, not elsewhere classified (R26.2)     Time: 2800-3491 PT Time Calculation (min) (ACUTE ONLY): 23 min  Charges:  $Gait Training: 8-22 mins $Therapeutic Exercise: 8-22 mins                     Verner Mould, DPT Acute Rehabilitation Services  Office [phone] Pager [phone]  09/11/2020 1:20 PM

## 2020-09-11 NOTE — Plan of Care (Signed)
  Problem: Clinical Measurements: Goal: Cardiovascular complication will be avoided Outcome: Progressing   Problem: Pain Managment: Goal: General experience of comfort will improve Outcome: Progressing   Problem: Safety: Goal: Ability to remain free from injury will improve Outcome: Progressing   Problem: Skin Integrity: Goal: Risk for impaired skin integrity will decrease Outcome: Progressing   

## 2020-09-11 NOTE — Progress Notes (Addendum)
Report called to facility, PTAR already called, waiting on ambulance for transportation.

## 2020-09-11 NOTE — Discharge Instructions (Signed)

## 2020-09-13 ENCOUNTER — Ambulatory Visit: Payer: Medicare HMO | Admitting: Family Medicine

## 2020-09-13 ENCOUNTER — Other Ambulatory Visit: Payer: Self-pay

## 2020-09-13 NOTE — Patient Outreach (Signed)
[facility]) Care Management  09/13/2020  LADARIAN BONCZEK Sep 06, 1961 [id number]     Transition of Care Referral  Referral Date: 09/13/2020 Referral Source: [facility] Discharge Report Date of Discharge: 09/11/2020 Facility: [place]: [facility]   Referral received. Upon chart review noted that patient discharged to SNF.     Plan: RN CM will close case at this time.    [name], RN,BSN,CCM [place] Management Telephonic Care Management Coordinator Direct Phone: [phone] Toll Free: [phone] Fax: [phone]

## 2020-09-14 DIAGNOSIS — R262 Difficulty in walking, not elsewhere classified: Secondary | ICD-10-CM | POA: Diagnosis not present

## 2020-09-14 DIAGNOSIS — M109 Gout, unspecified: Secondary | ICD-10-CM | POA: Diagnosis not present

## 2020-09-14 DIAGNOSIS — M25532 Pain in left wrist: Secondary | ICD-10-CM | POA: Diagnosis not present

## 2020-09-14 DIAGNOSIS — E1142 Type 2 diabetes mellitus with diabetic polyneuropathy: Secondary | ICD-10-CM | POA: Diagnosis not present

## 2020-09-14 DIAGNOSIS — M25561 Pain in right knee: Secondary | ICD-10-CM | POA: Diagnosis not present

## 2020-09-14 DIAGNOSIS — I10 Essential (primary) hypertension: Secondary | ICD-10-CM | POA: Diagnosis not present

## 2020-09-14 DIAGNOSIS — Z7901 Long term (current) use of anticoagulants: Secondary | ICD-10-CM | POA: Diagnosis not present

## 2020-09-14 DIAGNOSIS — M6281 Muscle weakness (generalized): Secondary | ICD-10-CM | POA: Diagnosis not present

## 2020-09-15 DIAGNOSIS — R600 Localized edema: Secondary | ICD-10-CM | POA: Diagnosis not present

## 2020-09-15 DIAGNOSIS — M25561 Pain in right knee: Secondary | ICD-10-CM | POA: Diagnosis not present

## 2020-09-15 DIAGNOSIS — M25532 Pain in left wrist: Secondary | ICD-10-CM | POA: Diagnosis not present

## 2020-09-15 DIAGNOSIS — M79604 Pain in right leg: Secondary | ICD-10-CM | POA: Diagnosis not present

## 2020-09-15 DIAGNOSIS — M109 Gout, unspecified: Secondary | ICD-10-CM | POA: Diagnosis not present

## 2020-09-18 DIAGNOSIS — R2689 Other abnormalities of gait and mobility: Secondary | ICD-10-CM | POA: Diagnosis not present

## 2020-09-18 DIAGNOSIS — R600 Localized edema: Secondary | ICD-10-CM | POA: Diagnosis not present

## 2020-09-18 DIAGNOSIS — M79642 Pain in left hand: Secondary | ICD-10-CM | POA: Diagnosis not present

## 2020-09-18 DIAGNOSIS — M10042 Idiopathic gout, left hand: Secondary | ICD-10-CM | POA: Diagnosis not present

## 2020-09-18 DIAGNOSIS — M6281 Muscle weakness (generalized): Secondary | ICD-10-CM | POA: Diagnosis not present

## 2020-09-18 DIAGNOSIS — N182 Chronic kidney disease, stage 2 (mild): Secondary | ICD-10-CM | POA: Diagnosis not present

## 2020-09-18 DIAGNOSIS — M109 Gout, unspecified: Secondary | ICD-10-CM | POA: Diagnosis not present

## 2020-09-18 DIAGNOSIS — M79605 Pain in left leg: Secondary | ICD-10-CM | POA: Diagnosis not present

## 2020-09-18 DIAGNOSIS — M79604 Pain in right leg: Secondary | ICD-10-CM | POA: Diagnosis not present

## 2020-09-19 DIAGNOSIS — M25532 Pain in left wrist: Secondary | ICD-10-CM | POA: Diagnosis not present

## 2020-09-19 DIAGNOSIS — R262 Difficulty in walking, not elsewhere classified: Secondary | ICD-10-CM | POA: Diagnosis not present

## 2020-09-19 DIAGNOSIS — M25561 Pain in right knee: Secondary | ICD-10-CM | POA: Diagnosis not present

## 2020-09-19 DIAGNOSIS — M6281 Muscle weakness (generalized): Secondary | ICD-10-CM | POA: Diagnosis not present

## 2020-09-21 DIAGNOSIS — N182 Chronic kidney disease, stage 2 (mild): Secondary | ICD-10-CM | POA: Diagnosis not present

## 2020-09-21 DIAGNOSIS — R2689 Other abnormalities of gait and mobility: Secondary | ICD-10-CM | POA: Diagnosis not present

## 2020-09-21 DIAGNOSIS — K219 Gastro-esophageal reflux disease without esophagitis: Secondary | ICD-10-CM | POA: Diagnosis not present

## 2020-09-21 DIAGNOSIS — E1165 Type 2 diabetes mellitus with hyperglycemia: Secondary | ICD-10-CM | POA: Diagnosis not present

## 2020-09-21 DIAGNOSIS — M109 Gout, unspecified: Secondary | ICD-10-CM | POA: Diagnosis not present

## 2020-09-21 DIAGNOSIS — M6281 Muscle weakness (generalized): Secondary | ICD-10-CM | POA: Diagnosis not present

## 2020-09-21 DIAGNOSIS — E1140 Type 2 diabetes mellitus with diabetic neuropathy, unspecified: Secondary | ICD-10-CM | POA: Diagnosis not present

## 2020-09-22 ENCOUNTER — Telehealth: Payer: Self-pay | Admitting: Family Medicine

## 2020-09-22 NOTE — Telephone Encounter (Signed)
Copied from [place] [id number]. Topic: General - Inquiry >> Sep 22, 2020  9:11 AM Oneta Rack wrote: Osvaldo Human name: [name]  Relation to pt: from [place]  Call back number: [phone]    Reason for call:  Requesting verbal orders for PT and OT for start of care tomorrow 09/23/2020

## 2020-09-23 DIAGNOSIS — I82622 Acute embolism and thrombosis of deep veins of left upper extremity: Secondary | ICD-10-CM | POA: Diagnosis not present

## 2020-09-23 DIAGNOSIS — D631 Anemia in chronic kidney disease: Secondary | ICD-10-CM | POA: Diagnosis not present

## 2020-09-23 DIAGNOSIS — A4151 Sepsis due to Escherichia coli [E. coli]: Secondary | ICD-10-CM | POA: Diagnosis not present

## 2020-09-23 DIAGNOSIS — E1122 Type 2 diabetes mellitus with diabetic chronic kidney disease: Secondary | ICD-10-CM | POA: Diagnosis not present

## 2020-09-23 DIAGNOSIS — E1142 Type 2 diabetes mellitus with diabetic polyneuropathy: Secondary | ICD-10-CM | POA: Diagnosis not present

## 2020-09-23 DIAGNOSIS — N182 Chronic kidney disease, stage 2 (mild): Secondary | ICD-10-CM | POA: Diagnosis not present

## 2020-09-23 DIAGNOSIS — B965 Pseudomonas (aeruginosa) (mallei) (pseudomallei) as the cause of diseases classified elsewhere: Secondary | ICD-10-CM | POA: Diagnosis not present

## 2020-09-23 DIAGNOSIS — I129 Hypertensive chronic kidney disease with stage 1 through stage 4 chronic kidney disease, or unspecified chronic kidney disease: Secondary | ICD-10-CM | POA: Diagnosis not present

## 2020-09-23 DIAGNOSIS — N390 Urinary tract infection, site not specified: Secondary | ICD-10-CM | POA: Diagnosis not present

## 2020-09-25 ENCOUNTER — Telehealth: Payer: Self-pay | Admitting: Family Medicine

## 2020-09-25 ENCOUNTER — Other Ambulatory Visit: Payer: Self-pay

## 2020-09-25 NOTE — Telephone Encounter (Signed)
[name], [facility], calling stating that the PT went to see pt on 09/23/20 and states that the pt could benefit from a Skilled Nursing Evaluation and pick pt up for a limited amount of time for possible medication management. Please advise.     [phone] secure line

## 2020-09-25 NOTE — Telephone Encounter (Signed)
Call placed to Amanda/[facility] # [phone], message left with call back requested to this CM

## 2020-09-25 NOTE — Telephone Encounter (Signed)
Voicemail was left with [name] giving her verbal orders.

## 2020-09-25 NOTE — Patient Outreach (Signed)
[place] [facility] [facility]) Care Management  09/25/2020  GEAN LAROSE [birth year] [id number]     Transition of Care Referral  Referral Date: 09/25/2020 Referral Source: [facility] Discharge Report Date of Discharge: 09/21/2020 Facility: [place]: [facility]    Referral received. Transition of care calls being completed via EMMI-automated calls. RN CM will outreach patient for any red flags received.     Plan: RN CM will close case at this time.    [name], RN,BSN,CCM [place] Management Telephonic Care Management Coordinator Direct Phone: [phone] Toll Free: [phone] Fax: [phone]

## 2020-09-26 ENCOUNTER — Telehealth: Payer: Self-pay | Admitting: Family Medicine

## 2020-09-26 DIAGNOSIS — I82622 Acute embolism and thrombosis of deep veins of left upper extremity: Secondary | ICD-10-CM | POA: Diagnosis not present

## 2020-09-26 DIAGNOSIS — E1122 Type 2 diabetes mellitus with diabetic chronic kidney disease: Secondary | ICD-10-CM | POA: Diagnosis not present

## 2020-09-26 DIAGNOSIS — D631 Anemia in chronic kidney disease: Secondary | ICD-10-CM | POA: Diagnosis not present

## 2020-09-26 DIAGNOSIS — N182 Chronic kidney disease, stage 2 (mild): Secondary | ICD-10-CM | POA: Diagnosis not present

## 2020-09-26 DIAGNOSIS — I129 Hypertensive chronic kidney disease with stage 1 through stage 4 chronic kidney disease, or unspecified chronic kidney disease: Secondary | ICD-10-CM | POA: Diagnosis not present

## 2020-09-26 DIAGNOSIS — N390 Urinary tract infection, site not specified: Secondary | ICD-10-CM | POA: Diagnosis not present

## 2020-09-26 DIAGNOSIS — E1142 Type 2 diabetes mellitus with diabetic polyneuropathy: Secondary | ICD-10-CM | POA: Diagnosis not present

## 2020-09-26 DIAGNOSIS — B965 Pseudomonas (aeruginosa) (mallei) (pseudomallei) as the cause of diseases classified elsewhere: Secondary | ICD-10-CM | POA: Diagnosis not present

## 2020-09-26 DIAGNOSIS — A4151 Sepsis due to Escherichia coli [E. coli]: Secondary | ICD-10-CM | POA: Diagnosis not present

## 2020-09-26 NOTE — Telephone Encounter (Signed)
Copied from [place] [id number]. Topic: Quick Communication - Home Health Verbal Orders >> Sep 26, 2020  3:00 PM Leward Quan A wrote: Caller/Agency: [place] // [facility] Number: [place] to [facility] Requesting OT/PT/Skilled Nursing/Social Work/Speech Therapy: OT  Frequency: 1 wk 5

## 2020-09-27 DIAGNOSIS — N390 Urinary tract infection, site not specified: Secondary | ICD-10-CM | POA: Diagnosis not present

## 2020-09-27 DIAGNOSIS — E1142 Type 2 diabetes mellitus with diabetic polyneuropathy: Secondary | ICD-10-CM | POA: Diagnosis not present

## 2020-09-27 DIAGNOSIS — I129 Hypertensive chronic kidney disease with stage 1 through stage 4 chronic kidney disease, or unspecified chronic kidney disease: Secondary | ICD-10-CM | POA: Diagnosis not present

## 2020-09-27 DIAGNOSIS — A4151 Sepsis due to Escherichia coli [E. coli]: Secondary | ICD-10-CM | POA: Diagnosis not present

## 2020-09-27 DIAGNOSIS — E1122 Type 2 diabetes mellitus with diabetic chronic kidney disease: Secondary | ICD-10-CM | POA: Diagnosis not present

## 2020-09-27 DIAGNOSIS — D631 Anemia in chronic kidney disease: Secondary | ICD-10-CM | POA: Diagnosis not present

## 2020-09-27 DIAGNOSIS — N182 Chronic kidney disease, stage 2 (mild): Secondary | ICD-10-CM | POA: Diagnosis not present

## 2020-09-27 DIAGNOSIS — I82622 Acute embolism and thrombosis of deep veins of left upper extremity: Secondary | ICD-10-CM | POA: Diagnosis not present

## 2020-09-27 DIAGNOSIS — B965 Pseudomonas (aeruginosa) (mallei) (pseudomallei) as the cause of diseases classified elsewhere: Secondary | ICD-10-CM | POA: Diagnosis not present

## 2020-09-27 NOTE — Telephone Encounter (Signed)
[name] was called and given verbal orders for patient.

## 2020-09-28 ENCOUNTER — Telehealth: Payer: Self-pay | Admitting: Family Medicine

## 2020-09-28 DIAGNOSIS — B965 Pseudomonas (aeruginosa) (mallei) (pseudomallei) as the cause of diseases classified elsewhere: Secondary | ICD-10-CM | POA: Diagnosis not present

## 2020-09-28 DIAGNOSIS — A4151 Sepsis due to Escherichia coli [E. coli]: Secondary | ICD-10-CM | POA: Diagnosis not present

## 2020-09-28 DIAGNOSIS — E1122 Type 2 diabetes mellitus with diabetic chronic kidney disease: Secondary | ICD-10-CM | POA: Diagnosis not present

## 2020-09-28 DIAGNOSIS — D631 Anemia in chronic kidney disease: Secondary | ICD-10-CM | POA: Diagnosis not present

## 2020-09-28 DIAGNOSIS — I82622 Acute embolism and thrombosis of deep veins of left upper extremity: Secondary | ICD-10-CM | POA: Diagnosis not present

## 2020-09-28 DIAGNOSIS — I129 Hypertensive chronic kidney disease with stage 1 through stage 4 chronic kidney disease, or unspecified chronic kidney disease: Secondary | ICD-10-CM | POA: Diagnosis not present

## 2020-09-28 DIAGNOSIS — E1142 Type 2 diabetes mellitus with diabetic polyneuropathy: Secondary | ICD-10-CM | POA: Diagnosis not present

## 2020-09-28 DIAGNOSIS — N390 Urinary tract infection, site not specified: Secondary | ICD-10-CM | POA: Diagnosis not present

## 2020-09-28 DIAGNOSIS — N182 Chronic kidney disease, stage 2 (mild): Secondary | ICD-10-CM | POA: Diagnosis not present

## 2020-09-28 NOTE — Telephone Encounter (Signed)
Copied from [place] [id number]. Topic: Quick Communication - Home Health Verbal Orders >> Sep 28, 2020  2:23 PM Gillis Ends D wrote: Call[facility]/Agency: [facility] Number: Magda Paganini [phone] Requesting OT/PT/Skilled Nursing/Social Work/Speech Therapy: Nurse Visit Frequency: Once a week for 3 weeks to help with medications and diabetes and just to evaluate compliance

## 2020-09-29 NOTE — Telephone Encounter (Signed)
Magda Paganini was called and given verbal orders for patient.

## 2020-10-02 DIAGNOSIS — E1122 Type 2 diabetes mellitus with diabetic chronic kidney disease: Secondary | ICD-10-CM | POA: Diagnosis not present

## 2020-10-02 DIAGNOSIS — A4151 Sepsis due to Escherichia coli [E. coli]: Secondary | ICD-10-CM | POA: Diagnosis not present

## 2020-10-02 DIAGNOSIS — B965 Pseudomonas (aeruginosa) (mallei) (pseudomallei) as the cause of diseases classified elsewhere: Secondary | ICD-10-CM | POA: Diagnosis not present

## 2020-10-02 DIAGNOSIS — E1142 Type 2 diabetes mellitus with diabetic polyneuropathy: Secondary | ICD-10-CM | POA: Diagnosis not present

## 2020-10-02 DIAGNOSIS — N182 Chronic kidney disease, stage 2 (mild): Secondary | ICD-10-CM | POA: Diagnosis not present

## 2020-10-02 DIAGNOSIS — D631 Anemia in chronic kidney disease: Secondary | ICD-10-CM | POA: Diagnosis not present

## 2020-10-02 DIAGNOSIS — N390 Urinary tract infection, site not specified: Secondary | ICD-10-CM | POA: Diagnosis not present

## 2020-10-02 DIAGNOSIS — I129 Hypertensive chronic kidney disease with stage 1 through stage 4 chronic kidney disease, or unspecified chronic kidney disease: Secondary | ICD-10-CM | POA: Diagnosis not present

## 2020-10-02 DIAGNOSIS — I82622 Acute embolism and thrombosis of deep veins of left upper extremity: Secondary | ICD-10-CM | POA: Diagnosis not present

## 2020-10-05 DIAGNOSIS — I129 Hypertensive chronic kidney disease with stage 1 through stage 4 chronic kidney disease, or unspecified chronic kidney disease: Secondary | ICD-10-CM | POA: Diagnosis not present

## 2020-10-05 DIAGNOSIS — E1142 Type 2 diabetes mellitus with diabetic polyneuropathy: Secondary | ICD-10-CM | POA: Diagnosis not present

## 2020-10-05 DIAGNOSIS — A4151 Sepsis due to Escherichia coli [E. coli]: Secondary | ICD-10-CM | POA: Diagnosis not present

## 2020-10-05 DIAGNOSIS — E1122 Type 2 diabetes mellitus with diabetic chronic kidney disease: Secondary | ICD-10-CM | POA: Diagnosis not present

## 2020-10-05 DIAGNOSIS — D631 Anemia in chronic kidney disease: Secondary | ICD-10-CM | POA: Diagnosis not present

## 2020-10-05 DIAGNOSIS — I82622 Acute embolism and thrombosis of deep veins of left upper extremity: Secondary | ICD-10-CM | POA: Diagnosis not present

## 2020-10-05 DIAGNOSIS — N182 Chronic kidney disease, stage 2 (mild): Secondary | ICD-10-CM | POA: Diagnosis not present

## 2020-10-05 DIAGNOSIS — B965 Pseudomonas (aeruginosa) (mallei) (pseudomallei) as the cause of diseases classified elsewhere: Secondary | ICD-10-CM | POA: Diagnosis not present

## 2020-10-05 DIAGNOSIS — N390 Urinary tract infection, site not specified: Secondary | ICD-10-CM | POA: Diagnosis not present

## 2020-10-09 ENCOUNTER — Telehealth: Payer: Self-pay | Admitting: Family Medicine

## 2020-10-09 DIAGNOSIS — D631 Anemia in chronic kidney disease: Secondary | ICD-10-CM | POA: Diagnosis not present

## 2020-10-09 DIAGNOSIS — I129 Hypertensive chronic kidney disease with stage 1 through stage 4 chronic kidney disease, or unspecified chronic kidney disease: Secondary | ICD-10-CM | POA: Diagnosis not present

## 2020-10-09 DIAGNOSIS — B965 Pseudomonas (aeruginosa) (mallei) (pseudomallei) as the cause of diseases classified elsewhere: Secondary | ICD-10-CM | POA: Diagnosis not present

## 2020-10-09 DIAGNOSIS — E1142 Type 2 diabetes mellitus with diabetic polyneuropathy: Secondary | ICD-10-CM | POA: Diagnosis not present

## 2020-10-09 DIAGNOSIS — A4151 Sepsis due to Escherichia coli [E. coli]: Secondary | ICD-10-CM | POA: Diagnosis not present

## 2020-10-09 DIAGNOSIS — N390 Urinary tract infection, site not specified: Secondary | ICD-10-CM | POA: Diagnosis not present

## 2020-10-09 DIAGNOSIS — I82622 Acute embolism and thrombosis of deep veins of left upper extremity: Secondary | ICD-10-CM | POA: Diagnosis not present

## 2020-10-09 DIAGNOSIS — N182 Chronic kidney disease, stage 2 (mild): Secondary | ICD-10-CM | POA: Diagnosis not present

## 2020-10-09 DIAGNOSIS — E1122 Type 2 diabetes mellitus with diabetic chronic kidney disease: Secondary | ICD-10-CM | POA: Diagnosis not present

## 2020-10-09 NOTE — Telephone Encounter (Signed)
Copied from [place] [id number]. Topic: General - Other >> Oct 05, 2020  2:26 PM Rainey Pines A wrote: [facility] called to inform that patient  missed 1 PT visit under doctors order due to flare up of gout.  Callback [phone]

## 2020-10-09 NOTE — Telephone Encounter (Signed)
FYI

## 2020-10-10 DIAGNOSIS — D631 Anemia in chronic kidney disease: Secondary | ICD-10-CM | POA: Diagnosis not present

## 2020-10-10 DIAGNOSIS — K5900 Constipation, unspecified: Secondary | ICD-10-CM | POA: Diagnosis not present

## 2020-10-10 DIAGNOSIS — N390 Urinary tract infection, site not specified: Secondary | ICD-10-CM | POA: Diagnosis not present

## 2020-10-10 DIAGNOSIS — B965 Pseudomonas (aeruginosa) (mallei) (pseudomallei) as the cause of diseases classified elsewhere: Secondary | ICD-10-CM | POA: Diagnosis not present

## 2020-10-10 DIAGNOSIS — E1140 Type 2 diabetes mellitus with diabetic neuropathy, unspecified: Secondary | ICD-10-CM | POA: Diagnosis not present

## 2020-10-10 DIAGNOSIS — R269 Unspecified abnormalities of gait and mobility: Secondary | ICD-10-CM | POA: Diagnosis not present

## 2020-10-10 DIAGNOSIS — N182 Chronic kidney disease, stage 2 (mild): Secondary | ICD-10-CM | POA: Diagnosis not present

## 2020-10-10 DIAGNOSIS — I82622 Acute embolism and thrombosis of deep veins of left upper extremity: Secondary | ICD-10-CM | POA: Diagnosis not present

## 2020-10-10 DIAGNOSIS — I129 Hypertensive chronic kidney disease with stage 1 through stage 4 chronic kidney disease, or unspecified chronic kidney disease: Secondary | ICD-10-CM | POA: Diagnosis not present

## 2020-10-10 DIAGNOSIS — E1142 Type 2 diabetes mellitus with diabetic polyneuropathy: Secondary | ICD-10-CM | POA: Diagnosis not present

## 2020-10-10 DIAGNOSIS — M109 Gout, unspecified: Secondary | ICD-10-CM | POA: Diagnosis not present

## 2020-10-10 DIAGNOSIS — R600 Localized edema: Secondary | ICD-10-CM | POA: Diagnosis not present

## 2020-10-10 DIAGNOSIS — E1122 Type 2 diabetes mellitus with diabetic chronic kidney disease: Secondary | ICD-10-CM | POA: Diagnosis not present

## 2020-10-10 DIAGNOSIS — K219 Gastro-esophageal reflux disease without esophagitis: Secondary | ICD-10-CM | POA: Diagnosis not present

## 2020-10-10 DIAGNOSIS — I10 Essential (primary) hypertension: Secondary | ICD-10-CM | POA: Diagnosis not present

## 2020-10-10 DIAGNOSIS — A4151 Sepsis due to Escherichia coli [E. coli]: Secondary | ICD-10-CM | POA: Diagnosis not present

## 2020-10-18 DIAGNOSIS — B965 Pseudomonas (aeruginosa) (mallei) (pseudomallei) as the cause of diseases classified elsewhere: Secondary | ICD-10-CM | POA: Diagnosis not present

## 2020-10-18 DIAGNOSIS — I82622 Acute embolism and thrombosis of deep veins of left upper extremity: Secondary | ICD-10-CM | POA: Diagnosis not present

## 2020-10-18 DIAGNOSIS — N390 Urinary tract infection, site not specified: Secondary | ICD-10-CM | POA: Diagnosis not present

## 2020-10-18 DIAGNOSIS — E1142 Type 2 diabetes mellitus with diabetic polyneuropathy: Secondary | ICD-10-CM | POA: Diagnosis not present

## 2020-10-18 DIAGNOSIS — I129 Hypertensive chronic kidney disease with stage 1 through stage 4 chronic kidney disease, or unspecified chronic kidney disease: Secondary | ICD-10-CM | POA: Diagnosis not present

## 2020-10-18 DIAGNOSIS — D631 Anemia in chronic kidney disease: Secondary | ICD-10-CM | POA: Diagnosis not present

## 2020-10-18 DIAGNOSIS — M6281 Muscle weakness (generalized): Secondary | ICD-10-CM | POA: Diagnosis not present

## 2020-10-18 DIAGNOSIS — R2689 Other abnormalities of gait and mobility: Secondary | ICD-10-CM | POA: Diagnosis not present

## 2020-10-18 DIAGNOSIS — E1122 Type 2 diabetes mellitus with diabetic chronic kidney disease: Secondary | ICD-10-CM | POA: Diagnosis not present

## 2020-10-18 DIAGNOSIS — M109 Gout, unspecified: Secondary | ICD-10-CM | POA: Diagnosis not present

## 2020-10-18 DIAGNOSIS — N182 Chronic kidney disease, stage 2 (mild): Secondary | ICD-10-CM | POA: Diagnosis not present

## 2020-10-18 DIAGNOSIS — A4151 Sepsis due to Escherichia coli [E. coli]: Secondary | ICD-10-CM | POA: Diagnosis not present

## 2020-10-20 DIAGNOSIS — E1122 Type 2 diabetes mellitus with diabetic chronic kidney disease: Secondary | ICD-10-CM | POA: Diagnosis not present

## 2020-10-20 DIAGNOSIS — N390 Urinary tract infection, site not specified: Secondary | ICD-10-CM | POA: Diagnosis not present

## 2020-10-20 DIAGNOSIS — I129 Hypertensive chronic kidney disease with stage 1 through stage 4 chronic kidney disease, or unspecified chronic kidney disease: Secondary | ICD-10-CM | POA: Diagnosis not present

## 2020-10-20 DIAGNOSIS — N182 Chronic kidney disease, stage 2 (mild): Secondary | ICD-10-CM | POA: Diagnosis not present

## 2020-10-20 DIAGNOSIS — A4151 Sepsis due to Escherichia coli [E. coli]: Secondary | ICD-10-CM | POA: Diagnosis not present

## 2020-10-20 DIAGNOSIS — D631 Anemia in chronic kidney disease: Secondary | ICD-10-CM | POA: Diagnosis not present

## 2020-10-20 DIAGNOSIS — E1142 Type 2 diabetes mellitus with diabetic polyneuropathy: Secondary | ICD-10-CM | POA: Diagnosis not present

## 2020-10-20 DIAGNOSIS — B965 Pseudomonas (aeruginosa) (mallei) (pseudomallei) as the cause of diseases classified elsewhere: Secondary | ICD-10-CM | POA: Diagnosis not present

## 2020-10-20 DIAGNOSIS — I82622 Acute embolism and thrombosis of deep veins of left upper extremity: Secondary | ICD-10-CM | POA: Diagnosis not present

## 2020-10-23 DIAGNOSIS — B965 Pseudomonas (aeruginosa) (mallei) (pseudomallei) as the cause of diseases classified elsewhere: Secondary | ICD-10-CM | POA: Diagnosis not present

## 2020-10-23 DIAGNOSIS — A4151 Sepsis due to Escherichia coli [E. coli]: Secondary | ICD-10-CM | POA: Diagnosis not present

## 2020-10-23 DIAGNOSIS — E1142 Type 2 diabetes mellitus with diabetic polyneuropathy: Secondary | ICD-10-CM | POA: Diagnosis not present

## 2020-10-23 DIAGNOSIS — I129 Hypertensive chronic kidney disease with stage 1 through stage 4 chronic kidney disease, or unspecified chronic kidney disease: Secondary | ICD-10-CM | POA: Diagnosis not present

## 2020-10-23 DIAGNOSIS — D631 Anemia in chronic kidney disease: Secondary | ICD-10-CM | POA: Diagnosis not present

## 2020-10-23 DIAGNOSIS — E1122 Type 2 diabetes mellitus with diabetic chronic kidney disease: Secondary | ICD-10-CM | POA: Diagnosis not present

## 2020-10-23 DIAGNOSIS — N390 Urinary tract infection, site not specified: Secondary | ICD-10-CM | POA: Diagnosis not present

## 2020-10-23 DIAGNOSIS — N182 Chronic kidney disease, stage 2 (mild): Secondary | ICD-10-CM | POA: Diagnosis not present

## 2020-10-23 DIAGNOSIS — I82622 Acute embolism and thrombosis of deep veins of left upper extremity: Secondary | ICD-10-CM | POA: Diagnosis not present

## 2020-10-24 DIAGNOSIS — N182 Chronic kidney disease, stage 2 (mild): Secondary | ICD-10-CM | POA: Diagnosis not present

## 2020-10-24 DIAGNOSIS — E1122 Type 2 diabetes mellitus with diabetic chronic kidney disease: Secondary | ICD-10-CM | POA: Diagnosis not present

## 2020-10-24 DIAGNOSIS — E1142 Type 2 diabetes mellitus with diabetic polyneuropathy: Secondary | ICD-10-CM | POA: Diagnosis not present

## 2020-10-24 DIAGNOSIS — D631 Anemia in chronic kidney disease: Secondary | ICD-10-CM | POA: Diagnosis not present

## 2020-10-24 DIAGNOSIS — I129 Hypertensive chronic kidney disease with stage 1 through stage 4 chronic kidney disease, or unspecified chronic kidney disease: Secondary | ICD-10-CM | POA: Diagnosis not present

## 2020-10-24 DIAGNOSIS — I82622 Acute embolism and thrombosis of deep veins of left upper extremity: Secondary | ICD-10-CM | POA: Diagnosis not present

## 2020-10-24 DIAGNOSIS — A4151 Sepsis due to Escherichia coli [E. coli]: Secondary | ICD-10-CM | POA: Diagnosis not present

## 2020-10-24 DIAGNOSIS — N390 Urinary tract infection, site not specified: Secondary | ICD-10-CM | POA: Diagnosis not present

## 2020-10-24 DIAGNOSIS — B965 Pseudomonas (aeruginosa) (mallei) (pseudomallei) as the cause of diseases classified elsewhere: Secondary | ICD-10-CM | POA: Diagnosis not present

## 2020-10-31 DIAGNOSIS — N390 Urinary tract infection, site not specified: Secondary | ICD-10-CM | POA: Diagnosis not present

## 2020-10-31 DIAGNOSIS — B965 Pseudomonas (aeruginosa) (mallei) (pseudomallei) as the cause of diseases classified elsewhere: Secondary | ICD-10-CM | POA: Diagnosis not present

## 2020-10-31 DIAGNOSIS — E1142 Type 2 diabetes mellitus with diabetic polyneuropathy: Secondary | ICD-10-CM | POA: Diagnosis not present

## 2020-10-31 DIAGNOSIS — I129 Hypertensive chronic kidney disease with stage 1 through stage 4 chronic kidney disease, or unspecified chronic kidney disease: Secondary | ICD-10-CM | POA: Diagnosis not present

## 2020-10-31 DIAGNOSIS — I82622 Acute embolism and thrombosis of deep veins of left upper extremity: Secondary | ICD-10-CM | POA: Diagnosis not present

## 2020-10-31 DIAGNOSIS — A4151 Sepsis due to Escherichia coli [E. coli]: Secondary | ICD-10-CM | POA: Diagnosis not present

## 2020-10-31 DIAGNOSIS — E1122 Type 2 diabetes mellitus with diabetic chronic kidney disease: Secondary | ICD-10-CM | POA: Diagnosis not present

## 2020-10-31 DIAGNOSIS — D631 Anemia in chronic kidney disease: Secondary | ICD-10-CM | POA: Diagnosis not present

## 2020-10-31 DIAGNOSIS — N182 Chronic kidney disease, stage 2 (mild): Secondary | ICD-10-CM | POA: Diagnosis not present

## 2020-11-18 DIAGNOSIS — R2689 Other abnormalities of gait and mobility: Secondary | ICD-10-CM | POA: Diagnosis not present

## 2020-11-18 DIAGNOSIS — M6281 Muscle weakness (generalized): Secondary | ICD-10-CM | POA: Diagnosis not present

## 2020-11-18 DIAGNOSIS — M109 Gout, unspecified: Secondary | ICD-10-CM | POA: Diagnosis not present

## 2020-11-18 DIAGNOSIS — N182 Chronic kidney disease, stage 2 (mild): Secondary | ICD-10-CM | POA: Diagnosis not present

## 2020-11-21 DIAGNOSIS — A4151 Sepsis due to Escherichia coli [E. coli]: Secondary | ICD-10-CM | POA: Diagnosis not present

## 2020-11-21 DIAGNOSIS — I129 Hypertensive chronic kidney disease with stage 1 through stage 4 chronic kidney disease, or unspecified chronic kidney disease: Secondary | ICD-10-CM | POA: Diagnosis not present

## 2020-11-21 DIAGNOSIS — D631 Anemia in chronic kidney disease: Secondary | ICD-10-CM | POA: Diagnosis not present

## 2020-11-21 DIAGNOSIS — E1122 Type 2 diabetes mellitus with diabetic chronic kidney disease: Secondary | ICD-10-CM | POA: Diagnosis not present

## 2020-11-21 DIAGNOSIS — N390 Urinary tract infection, site not specified: Secondary | ICD-10-CM | POA: Diagnosis not present

## 2020-11-21 DIAGNOSIS — I82622 Acute embolism and thrombosis of deep veins of left upper extremity: Secondary | ICD-10-CM | POA: Diagnosis not present

## 2020-11-21 DIAGNOSIS — E1142 Type 2 diabetes mellitus with diabetic polyneuropathy: Secondary | ICD-10-CM | POA: Diagnosis not present

## 2020-11-21 DIAGNOSIS — N182 Chronic kidney disease, stage 2 (mild): Secondary | ICD-10-CM | POA: Diagnosis not present

## 2020-11-21 DIAGNOSIS — B965 Pseudomonas (aeruginosa) (mallei) (pseudomallei) as the cause of diseases classified elsewhere: Secondary | ICD-10-CM | POA: Diagnosis not present

## 2020-11-22 DIAGNOSIS — I129 Hypertensive chronic kidney disease with stage 1 through stage 4 chronic kidney disease, or unspecified chronic kidney disease: Secondary | ICD-10-CM | POA: Diagnosis not present

## 2020-11-22 DIAGNOSIS — B965 Pseudomonas (aeruginosa) (mallei) (pseudomallei) as the cause of diseases classified elsewhere: Secondary | ICD-10-CM | POA: Diagnosis not present

## 2020-11-22 DIAGNOSIS — N182 Chronic kidney disease, stage 2 (mild): Secondary | ICD-10-CM | POA: Diagnosis not present

## 2020-11-22 DIAGNOSIS — E1142 Type 2 diabetes mellitus with diabetic polyneuropathy: Secondary | ICD-10-CM | POA: Diagnosis not present

## 2020-11-22 DIAGNOSIS — A4151 Sepsis due to Escherichia coli [E. coli]: Secondary | ICD-10-CM | POA: Diagnosis not present

## 2020-11-22 DIAGNOSIS — E1122 Type 2 diabetes mellitus with diabetic chronic kidney disease: Secondary | ICD-10-CM | POA: Diagnosis not present

## 2020-11-22 DIAGNOSIS — N390 Urinary tract infection, site not specified: Secondary | ICD-10-CM | POA: Diagnosis not present

## 2020-11-22 DIAGNOSIS — D631 Anemia in chronic kidney disease: Secondary | ICD-10-CM | POA: Diagnosis not present

## 2020-11-22 DIAGNOSIS — I82622 Acute embolism and thrombosis of deep veins of left upper extremity: Secondary | ICD-10-CM | POA: Diagnosis not present

## 2020-11-27 DIAGNOSIS — I129 Hypertensive chronic kidney disease with stage 1 through stage 4 chronic kidney disease, or unspecified chronic kidney disease: Secondary | ICD-10-CM | POA: Diagnosis not present

## 2020-11-27 DIAGNOSIS — N182 Chronic kidney disease, stage 2 (mild): Secondary | ICD-10-CM | POA: Diagnosis not present

## 2020-11-27 DIAGNOSIS — E1122 Type 2 diabetes mellitus with diabetic chronic kidney disease: Secondary | ICD-10-CM | POA: Diagnosis not present

## 2020-11-27 DIAGNOSIS — I82622 Acute embolism and thrombosis of deep veins of left upper extremity: Secondary | ICD-10-CM | POA: Diagnosis not present

## 2020-11-27 DIAGNOSIS — E1142 Type 2 diabetes mellitus with diabetic polyneuropathy: Secondary | ICD-10-CM | POA: Diagnosis not present

## 2020-11-27 DIAGNOSIS — B965 Pseudomonas (aeruginosa) (mallei) (pseudomallei) as the cause of diseases classified elsewhere: Secondary | ICD-10-CM | POA: Diagnosis not present

## 2020-11-27 DIAGNOSIS — A4151 Sepsis due to Escherichia coli [E. coli]: Secondary | ICD-10-CM | POA: Diagnosis not present

## 2020-11-27 DIAGNOSIS — D631 Anemia in chronic kidney disease: Secondary | ICD-10-CM | POA: Diagnosis not present

## 2020-11-27 DIAGNOSIS — N390 Urinary tract infection, site not specified: Secondary | ICD-10-CM | POA: Diagnosis not present

## 2020-12-15 ENCOUNTER — Telehealth: Payer: Self-pay | Admitting: Family Medicine

## 2020-12-15 NOTE — Telephone Encounter (Signed)
Home Health Verbal Orders - Caller/Agency: Di Kindle at [facility] Number: [phone] Requesting OT/PT/Skilled Nursing/Social Work/Speech Therapy: PT missed appt  Frequency:   Pt has not returned calls from home health physical therapy agency, they were unable to schedule visit/see him.

## 2020-12-18 DIAGNOSIS — N390 Urinary tract infection, site not specified: Secondary | ICD-10-CM | POA: Diagnosis not present

## 2020-12-18 DIAGNOSIS — N182 Chronic kidney disease, stage 2 (mild): Secondary | ICD-10-CM | POA: Diagnosis not present

## 2020-12-18 DIAGNOSIS — B965 Pseudomonas (aeruginosa) (mallei) (pseudomallei) as the cause of diseases classified elsewhere: Secondary | ICD-10-CM | POA: Diagnosis not present

## 2020-12-18 DIAGNOSIS — D631 Anemia in chronic kidney disease: Secondary | ICD-10-CM | POA: Diagnosis not present

## 2020-12-18 DIAGNOSIS — E1122 Type 2 diabetes mellitus with diabetic chronic kidney disease: Secondary | ICD-10-CM | POA: Diagnosis not present

## 2020-12-18 DIAGNOSIS — E1142 Type 2 diabetes mellitus with diabetic polyneuropathy: Secondary | ICD-10-CM | POA: Diagnosis not present

## 2020-12-18 DIAGNOSIS — I129 Hypertensive chronic kidney disease with stage 1 through stage 4 chronic kidney disease, or unspecified chronic kidney disease: Secondary | ICD-10-CM | POA: Diagnosis not present

## 2020-12-18 DIAGNOSIS — A4151 Sepsis due to Escherichia coli [E. coli]: Secondary | ICD-10-CM | POA: Diagnosis not present

## 2020-12-18 DIAGNOSIS — I82622 Acute embolism and thrombosis of deep veins of left upper extremity: Secondary | ICD-10-CM | POA: Diagnosis not present

## 2020-12-19 DIAGNOSIS — N182 Chronic kidney disease, stage 2 (mild): Secondary | ICD-10-CM | POA: Diagnosis not present

## 2020-12-19 DIAGNOSIS — R2689 Other abnormalities of gait and mobility: Secondary | ICD-10-CM | POA: Diagnosis not present

## 2020-12-19 DIAGNOSIS — M6281 Muscle weakness (generalized): Secondary | ICD-10-CM | POA: Diagnosis not present

## 2020-12-19 DIAGNOSIS — M109 Gout, unspecified: Secondary | ICD-10-CM | POA: Diagnosis not present

## 2020-12-22 DIAGNOSIS — A4151 Sepsis due to Escherichia coli [E. coli]: Secondary | ICD-10-CM | POA: Diagnosis not present

## 2020-12-22 DIAGNOSIS — N390 Urinary tract infection, site not specified: Secondary | ICD-10-CM | POA: Diagnosis not present

## 2020-12-22 DIAGNOSIS — B965 Pseudomonas (aeruginosa) (mallei) (pseudomallei) as the cause of diseases classified elsewhere: Secondary | ICD-10-CM | POA: Diagnosis not present

## 2020-12-22 DIAGNOSIS — I129 Hypertensive chronic kidney disease with stage 1 through stage 4 chronic kidney disease, or unspecified chronic kidney disease: Secondary | ICD-10-CM | POA: Diagnosis not present

## 2020-12-22 DIAGNOSIS — N182 Chronic kidney disease, stage 2 (mild): Secondary | ICD-10-CM | POA: Diagnosis not present

## 2020-12-22 DIAGNOSIS — E1142 Type 2 diabetes mellitus with diabetic polyneuropathy: Secondary | ICD-10-CM | POA: Diagnosis not present

## 2020-12-22 DIAGNOSIS — E1122 Type 2 diabetes mellitus with diabetic chronic kidney disease: Secondary | ICD-10-CM | POA: Diagnosis not present

## 2020-12-22 DIAGNOSIS — D631 Anemia in chronic kidney disease: Secondary | ICD-10-CM | POA: Diagnosis not present

## 2020-12-22 DIAGNOSIS — I82622 Acute embolism and thrombosis of deep veins of left upper extremity: Secondary | ICD-10-CM | POA: Diagnosis not present

## 2021-01-03 DIAGNOSIS — E7800 Pure hypercholesterolemia, unspecified: Secondary | ICD-10-CM | POA: Diagnosis not present

## 2021-01-03 DIAGNOSIS — R634 Abnormal weight loss: Secondary | ICD-10-CM | POA: Diagnosis not present

## 2021-01-03 DIAGNOSIS — E119 Type 2 diabetes mellitus without complications: Secondary | ICD-10-CM | POA: Diagnosis not present

## 2021-01-03 DIAGNOSIS — E1122 Type 2 diabetes mellitus with diabetic chronic kidney disease: Secondary | ICD-10-CM | POA: Diagnosis not present

## 2021-01-03 DIAGNOSIS — M109 Gout, unspecified: Secondary | ICD-10-CM | POA: Diagnosis not present

## 2021-01-03 DIAGNOSIS — Z125 Encounter for screening for malignant neoplasm of prostate: Secondary | ICD-10-CM | POA: Diagnosis not present

## 2021-01-10 DIAGNOSIS — E7800 Pure hypercholesterolemia, unspecified: Secondary | ICD-10-CM | POA: Diagnosis not present

## 2021-01-10 DIAGNOSIS — Z6841 Body Mass Index (BMI) 40.0 and over, adult: Secondary | ICD-10-CM | POA: Diagnosis not present

## 2021-01-10 DIAGNOSIS — E1122 Type 2 diabetes mellitus with diabetic chronic kidney disease: Secondary | ICD-10-CM | POA: Diagnosis not present

## 2021-01-10 DIAGNOSIS — I10 Essential (primary) hypertension: Secondary | ICD-10-CM | POA: Diagnosis not present

## 2021-01-10 DIAGNOSIS — Z794 Long term (current) use of insulin: Secondary | ICD-10-CM | POA: Diagnosis not present

## 2021-01-10 DIAGNOSIS — M109 Gout, unspecified: Secondary | ICD-10-CM | POA: Diagnosis not present

## 2021-01-10 DIAGNOSIS — R600 Localized edema: Secondary | ICD-10-CM | POA: Diagnosis not present

## 2021-01-10 DIAGNOSIS — K219 Gastro-esophageal reflux disease without esophagitis: Secondary | ICD-10-CM | POA: Diagnosis not present

## 2021-01-16 DIAGNOSIS — R2689 Other abnormalities of gait and mobility: Secondary | ICD-10-CM | POA: Diagnosis not present

## 2021-01-16 DIAGNOSIS — M109 Gout, unspecified: Secondary | ICD-10-CM | POA: Diagnosis not present

## 2021-01-16 DIAGNOSIS — N182 Chronic kidney disease, stage 2 (mild): Secondary | ICD-10-CM | POA: Diagnosis not present

## 2021-01-16 DIAGNOSIS — M6281 Muscle weakness (generalized): Secondary | ICD-10-CM | POA: Diagnosis not present

## 2021-01-18 DIAGNOSIS — E1142 Type 2 diabetes mellitus with diabetic polyneuropathy: Secondary | ICD-10-CM | POA: Diagnosis not present

## 2021-01-18 DIAGNOSIS — N182 Chronic kidney disease, stage 2 (mild): Secondary | ICD-10-CM | POA: Diagnosis not present

## 2021-01-18 DIAGNOSIS — E1122 Type 2 diabetes mellitus with diabetic chronic kidney disease: Secondary | ICD-10-CM | POA: Diagnosis not present

## 2021-01-18 DIAGNOSIS — B965 Pseudomonas (aeruginosa) (mallei) (pseudomallei) as the cause of diseases classified elsewhere: Secondary | ICD-10-CM | POA: Diagnosis not present

## 2021-01-18 DIAGNOSIS — A4151 Sepsis due to Escherichia coli [E. coli]: Secondary | ICD-10-CM | POA: Diagnosis not present

## 2021-01-18 DIAGNOSIS — I82622 Acute embolism and thrombosis of deep veins of left upper extremity: Secondary | ICD-10-CM | POA: Diagnosis not present

## 2021-01-18 DIAGNOSIS — N390 Urinary tract infection, site not specified: Secondary | ICD-10-CM | POA: Diagnosis not present

## 2021-01-18 DIAGNOSIS — I129 Hypertensive chronic kidney disease with stage 1 through stage 4 chronic kidney disease, or unspecified chronic kidney disease: Secondary | ICD-10-CM | POA: Diagnosis not present

## 2021-01-18 DIAGNOSIS — D631 Anemia in chronic kidney disease: Secondary | ICD-10-CM | POA: Diagnosis not present

## 2021-02-06 DIAGNOSIS — H40033 Anatomical narrow angle, bilateral: Secondary | ICD-10-CM | POA: Diagnosis not present

## 2021-02-06 DIAGNOSIS — E119 Type 2 diabetes mellitus without complications: Secondary | ICD-10-CM | POA: Diagnosis not present

## 2021-02-16 DIAGNOSIS — N182 Chronic kidney disease, stage 2 (mild): Secondary | ICD-10-CM | POA: Diagnosis not present

## 2021-02-16 DIAGNOSIS — M109 Gout, unspecified: Secondary | ICD-10-CM | POA: Diagnosis not present

## 2021-02-16 DIAGNOSIS — M6281 Muscle weakness (generalized): Secondary | ICD-10-CM | POA: Diagnosis not present

## 2021-02-16 DIAGNOSIS — R2689 Other abnormalities of gait and mobility: Secondary | ICD-10-CM | POA: Diagnosis not present

## 2021-02-22 ENCOUNTER — Other Ambulatory Visit: Payer: Self-pay

## 2021-02-22 ENCOUNTER — Other Ambulatory Visit: Payer: Self-pay | Admitting: Family Medicine

## 2021-02-22 DIAGNOSIS — E1169 Type 2 diabetes mellitus with other specified complication: Secondary | ICD-10-CM

## 2021-02-22 NOTE — Telephone Encounter (Signed)
  Notes to clinic:  this script has expired  Review for refill    Requested Prescriptions  Pending Prescriptions Disp Refills   Accu-Chek Softclix Lancets lancets [Pharmacy Med Name: ACCU-CHEK SOFTCLIX LANCETS] 300 each     Sig: TEST BLOOD SUGAR THREE TIMES DAILY AS DIRECTED      Endocrinology: Diabetes - Testing Supplies Passed - 02/22/2021  9:41 AM      Passed - Valid encounter within last 12 months    Recent Outpatient Visits           8 months ago Type 2 diabetes mellitus with other specified complication, with long-term current use of insulin ([place])   [place], [place], MD   11 months ago Type 2 diabetes mellitus with other specified complication, with long-term current use of insulin ([place])   [place], Charlane Ferretti, MD   1 year ago Right elbow pain   [place], [place], [place]   1 year ago Acute idiopathic gout of right hand   [place], [place], [place]   1 year ago Acute idiopathic gout of right hand   [place], En[place]ong, MD                  [place] test strip [facility] Med Name: ACCU-CHEK AVIVA PLUS   Strip] 300 strip     Sig: TEST BLOOD [place] AS DIRECTED      Endocrinology: Diabetes - Testing Supplies Passed - 02/22/2021  9:41 AM      Passed - Valid encounter within last 12 months    Recent Outpatient Visits           8 months ago Type 2 diabetes mellitus with other specified complication, with long-term current use of insulin ([place])   [place], [place], MD   11 months ago Type 2 diabetes mellitus with other specified complication, with long-term current use of insulin ([place])   [place], Enobong, MD   1 year ago Right elbow pain   [place], [place]   1 year ago Acute idiopathic gout of right hand   [place], [place]   1 year ago Acute idiopathic gout of right hand   [facility] Charlott Rakes, MD

## 2021-03-18 DIAGNOSIS — R2689 Other abnormalities of gait and mobility: Secondary | ICD-10-CM | POA: Diagnosis not present

## 2021-03-18 DIAGNOSIS — M6281 Muscle weakness (generalized): Secondary | ICD-10-CM | POA: Diagnosis not present

## 2021-03-18 DIAGNOSIS — M109 Gout, unspecified: Secondary | ICD-10-CM | POA: Diagnosis not present

## 2021-03-18 DIAGNOSIS — N182 Chronic kidney disease, stage 2 (mild): Secondary | ICD-10-CM | POA: Diagnosis not present

## 2021-04-18 DIAGNOSIS — R2689 Other abnormalities of gait and mobility: Secondary | ICD-10-CM | POA: Diagnosis not present

## 2021-04-18 DIAGNOSIS — M109 Gout, unspecified: Secondary | ICD-10-CM | POA: Diagnosis not present

## 2021-04-18 DIAGNOSIS — M6281 Muscle weakness (generalized): Secondary | ICD-10-CM | POA: Diagnosis not present

## 2021-04-18 DIAGNOSIS — N182 Chronic kidney disease, stage 2 (mild): Secondary | ICD-10-CM | POA: Diagnosis not present

## 2021-04-21 IMAGING — CT CT ANGIO CHEST
3 of 7 series · 18 of 36 positions shown · IV contrast (OMNIPAQUE 350)
Comparison: Same day chest x-ray

CLINICAL DATA: Tachycardia, lower extremity swelling

EXAM:
CT ANGIOGRAPHY CHEST WITH CONTRAST
TECHNIQUE: Multidetector CT imaging of the chest was performed using the
standard protocol during bolus administration of intravenous
contrast. Multiplanar CT image reconstructions and MIPs were
obtained to evaluate the vascular anatomy.
CONTRAST:  68mL OMNIPAQUE IOHEXOL 350 MG/ML SOLN

[Series 5: thins · axial · 0.75mm/px · z∈[+1493,+1727]mm · 12 of 278 slices shown]
[im 22/278  lung]
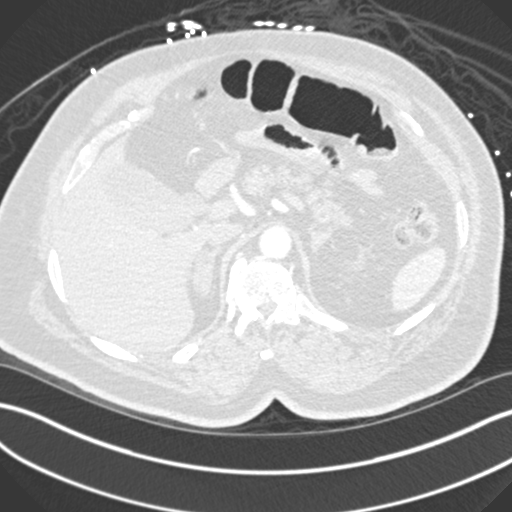
[im 43/278  mediastinal]
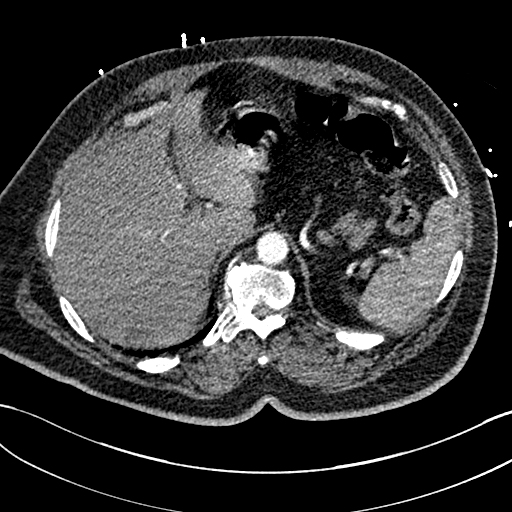
[im 64/278  lung]
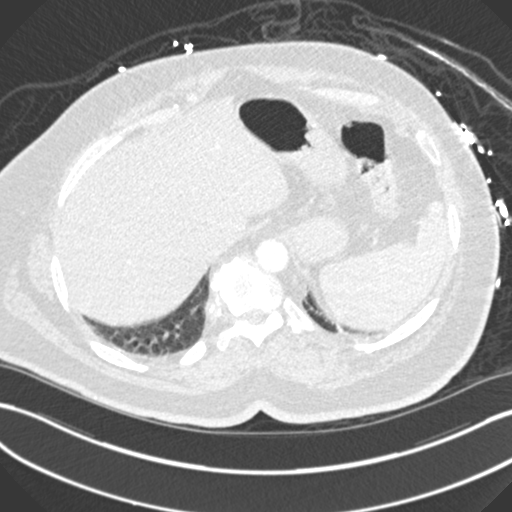
[im 86/278  mediastinal]
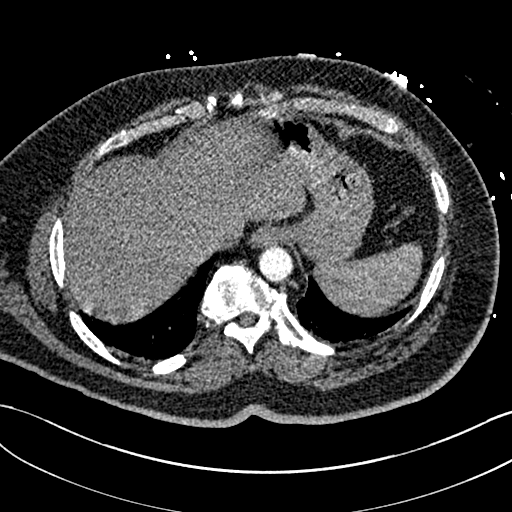
[im 107/278  lung]
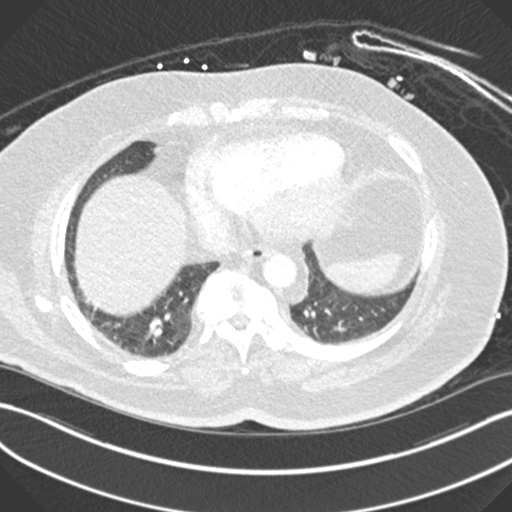
[im 128/278  mediastinal]
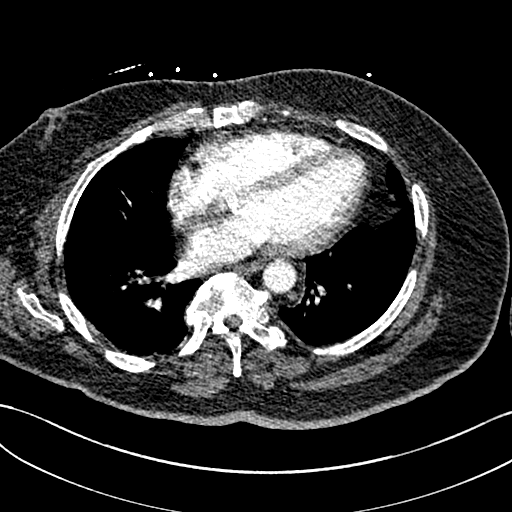
[im 150/278  lung]
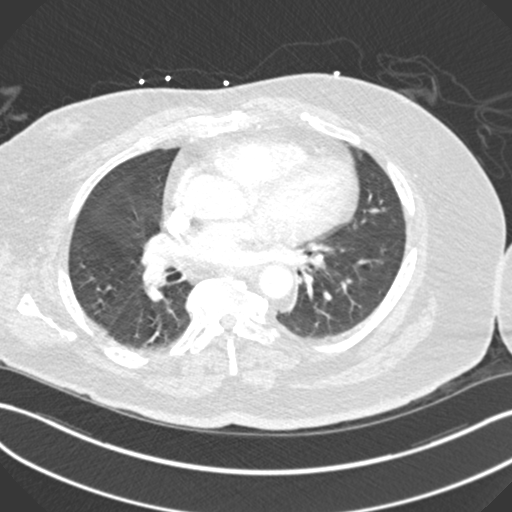
[im 171/278  mediastinal]
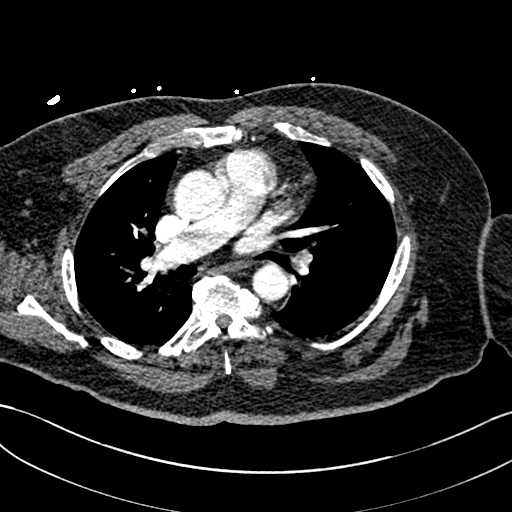
[im 192/278  lung]
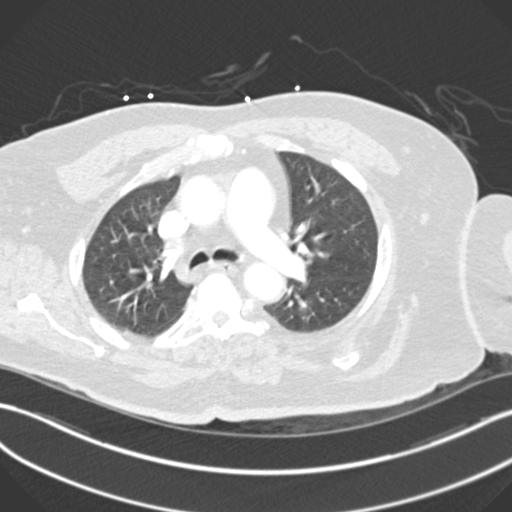
[im 214/278  mediastinal]
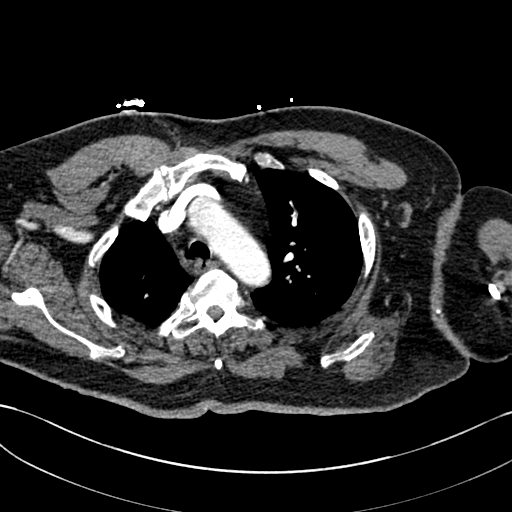
[im 235/278  lung]
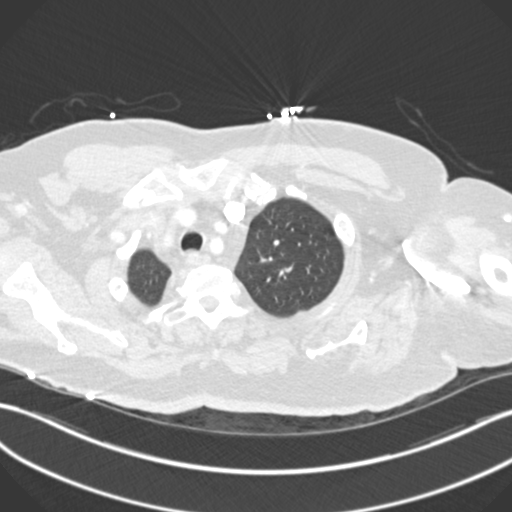
[im 256/278  mediastinal]
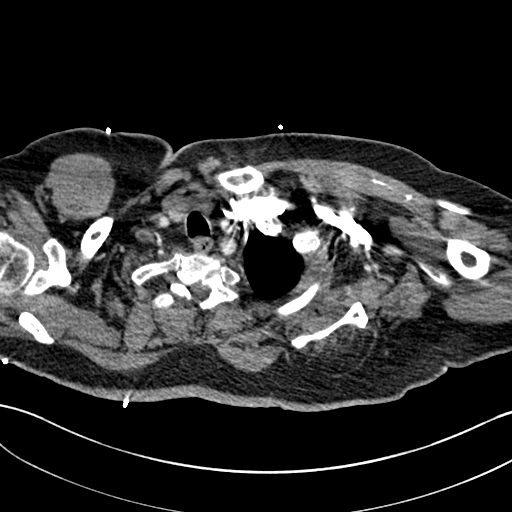

[Series 6: coronal mpr · coronal · 0.62mm/px · 1 of 137 slices shown]
[im 69/137  mediastinal]
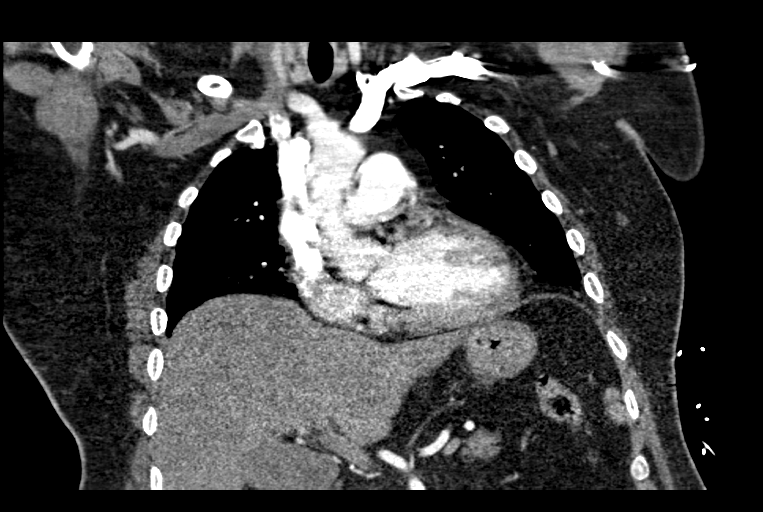

[Series 10: lung · axial · 0.75mm/px · z∈[+1516,+1696]mm · 5 of 136 slices shown]
[im 23/136  mediastinal]
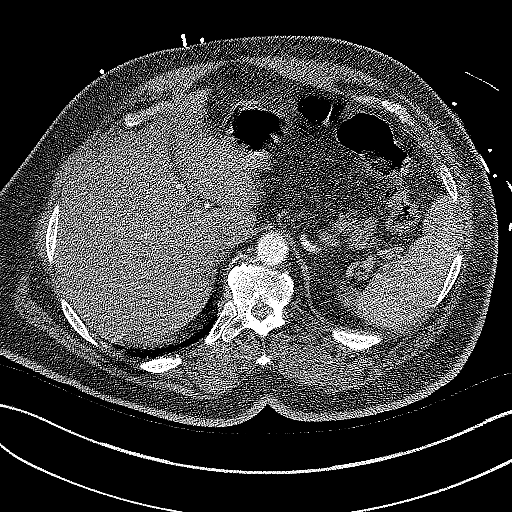
[im 46/136  mediastinal]
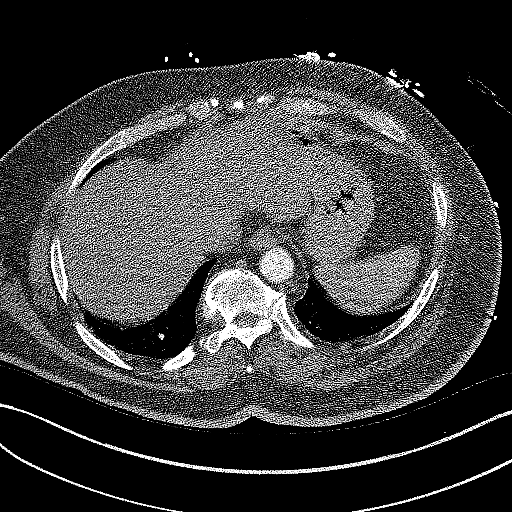
[im 68/136  mediastinal]
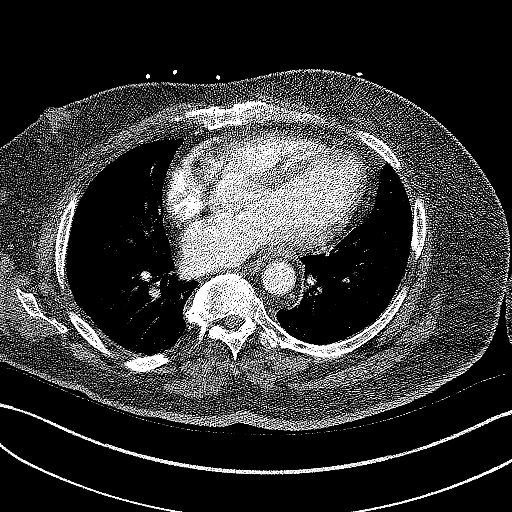
[im 91/136  mediastinal]
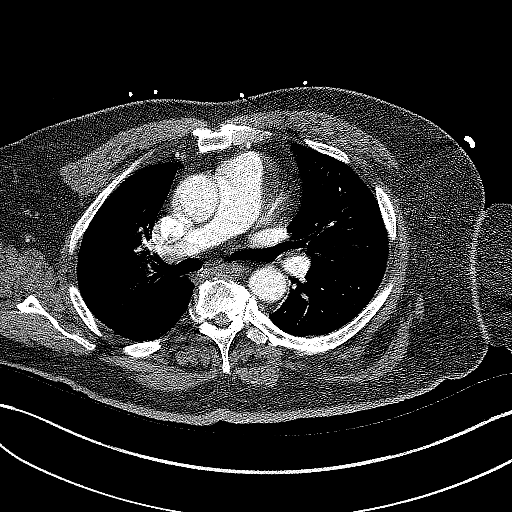
[im 113/136  mediastinal]
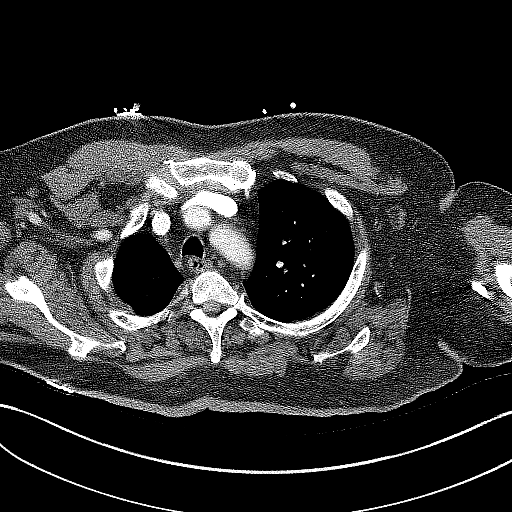

[18 of 36 positions shown; findings below may reference images not displayed]

FINDINGS: Cardiovascular: Examination is mildly degraded by respiratory motion
artifact. Adequate opacification of the pulmonary arteries. No
filling defect is evident to the lobar branch level. Thoracic aorta
is normal in course and caliber. Heart size is upper limits of
normal. No pericardial effusion.

Mediastinum/Nodes: No enlarged mediastinal, hilar, or axillary lymph
nodes. Thyroid gland, trachea, and esophagus demonstrate no
significant findings.

Lungs/Pleura: Lungs are clear. No pleural effusion or pneumothorax.

Upper Abdomen: Probable subcentimeter right adrenal adenoma. No
acute findings.

Musculoskeletal: Degenerative changes within the thoracic spine. No
acute osseous findings. Right-sided gynecomastia.

Review of the MIP images confirms the above findings.
IMPRESSION: 1. Slightly limited study. Negative for pulmonary embolism to the
lobar branch level.
2. Lungs are clear.
3. Probable subcentimeter right adrenal adenoma.

## 2021-05-09 IMAGING — DX DG CHEST 1V PORT
1 series · 1 of 1 positions shown · non-contrast
Comparison: August 10, 2020.

CLINICAL DATA: Fever

EXAM:
PORTABLE CHEST 1 VIEW

[chest ap]
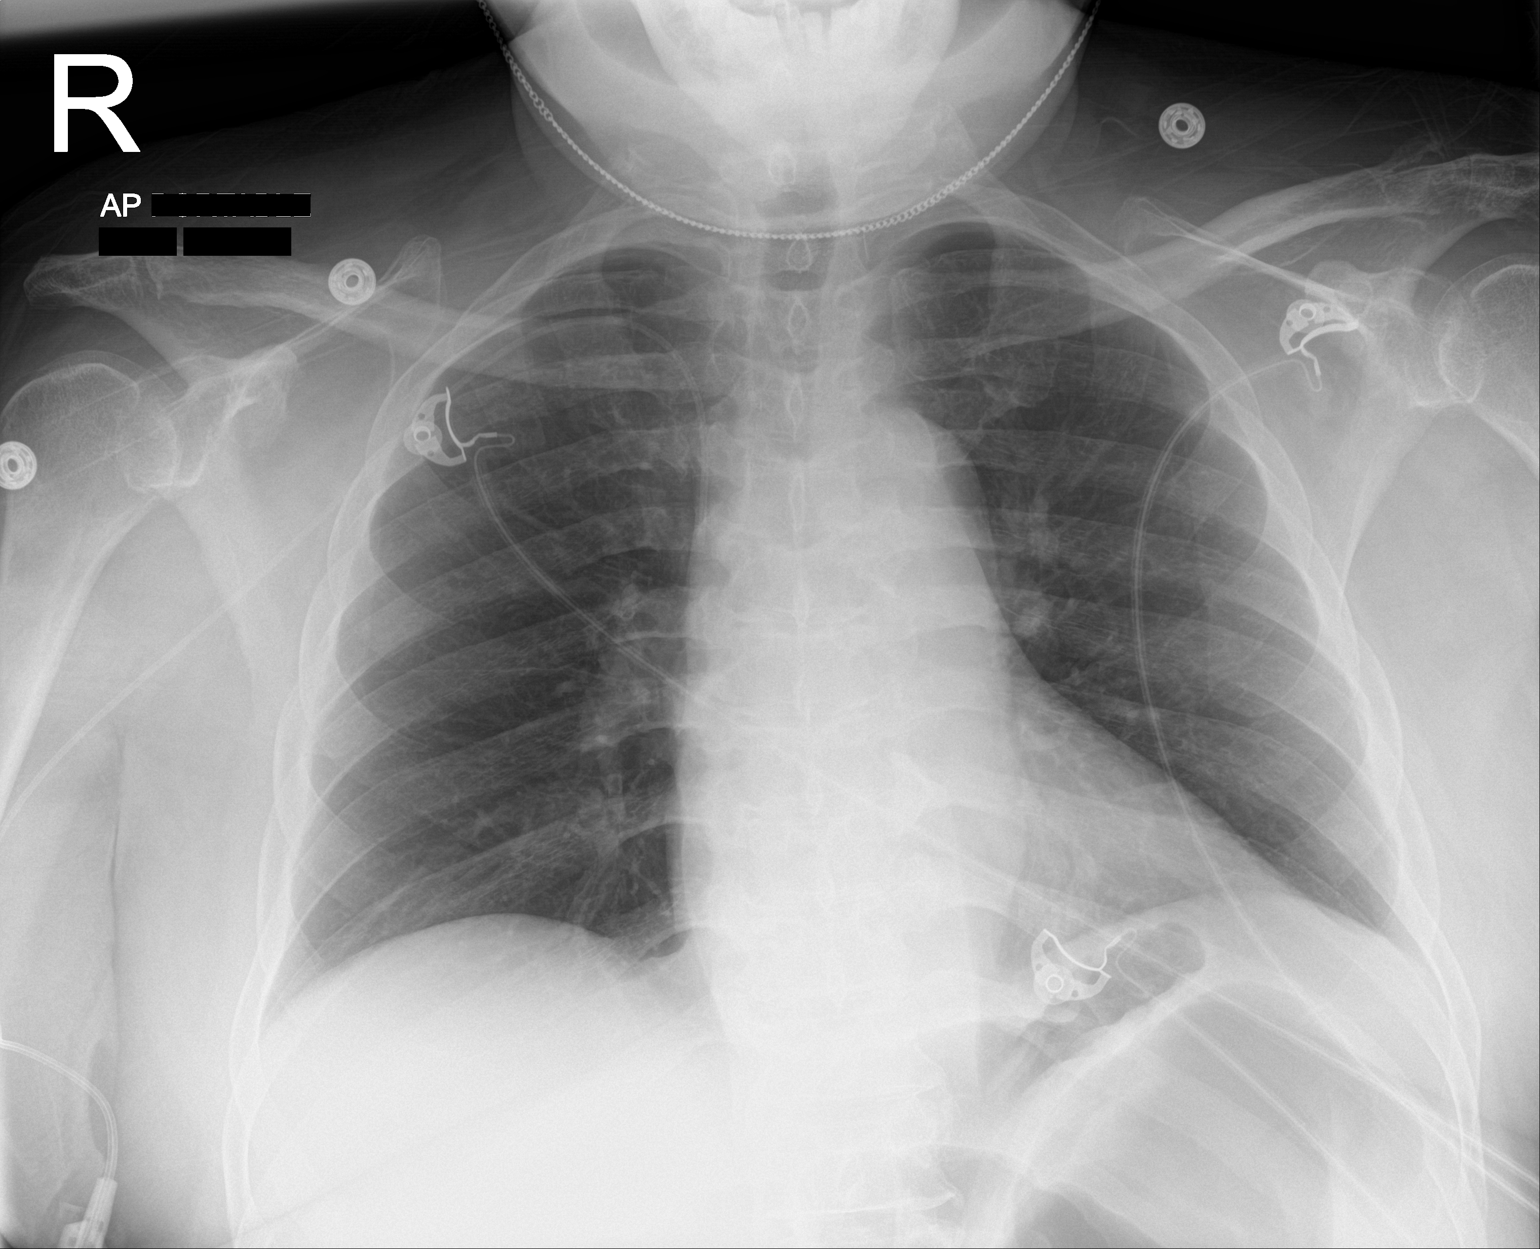

[1 of 1 positions shown; findings below may reference images not displayed]

FINDINGS: The cardiomediastinal silhouette is unchanged in contour.RIGHT upper
extremity PICC tip terminates over the SVC. No pleural effusion. No
pneumothorax. No acute pleuroparenchymal abnormality. Partial
visualization of gaseous dilation of loops of bowel. No acute
osseous abnormality.
IMPRESSION: 1. No acute cardiopulmonary abnormality.
2. Partial visualization of gaseous dilation of loops of bowel.

## 2021-05-15 IMAGING — DX DG ABDOMEN 2V
3 series · 3 of 3 positions shown · non-contrast
Comparison: August 29, 2020.

CLINICAL DATA: Ileus.

EXAM:
ABDOMEN - 2 VIEW

[abdomen erect]
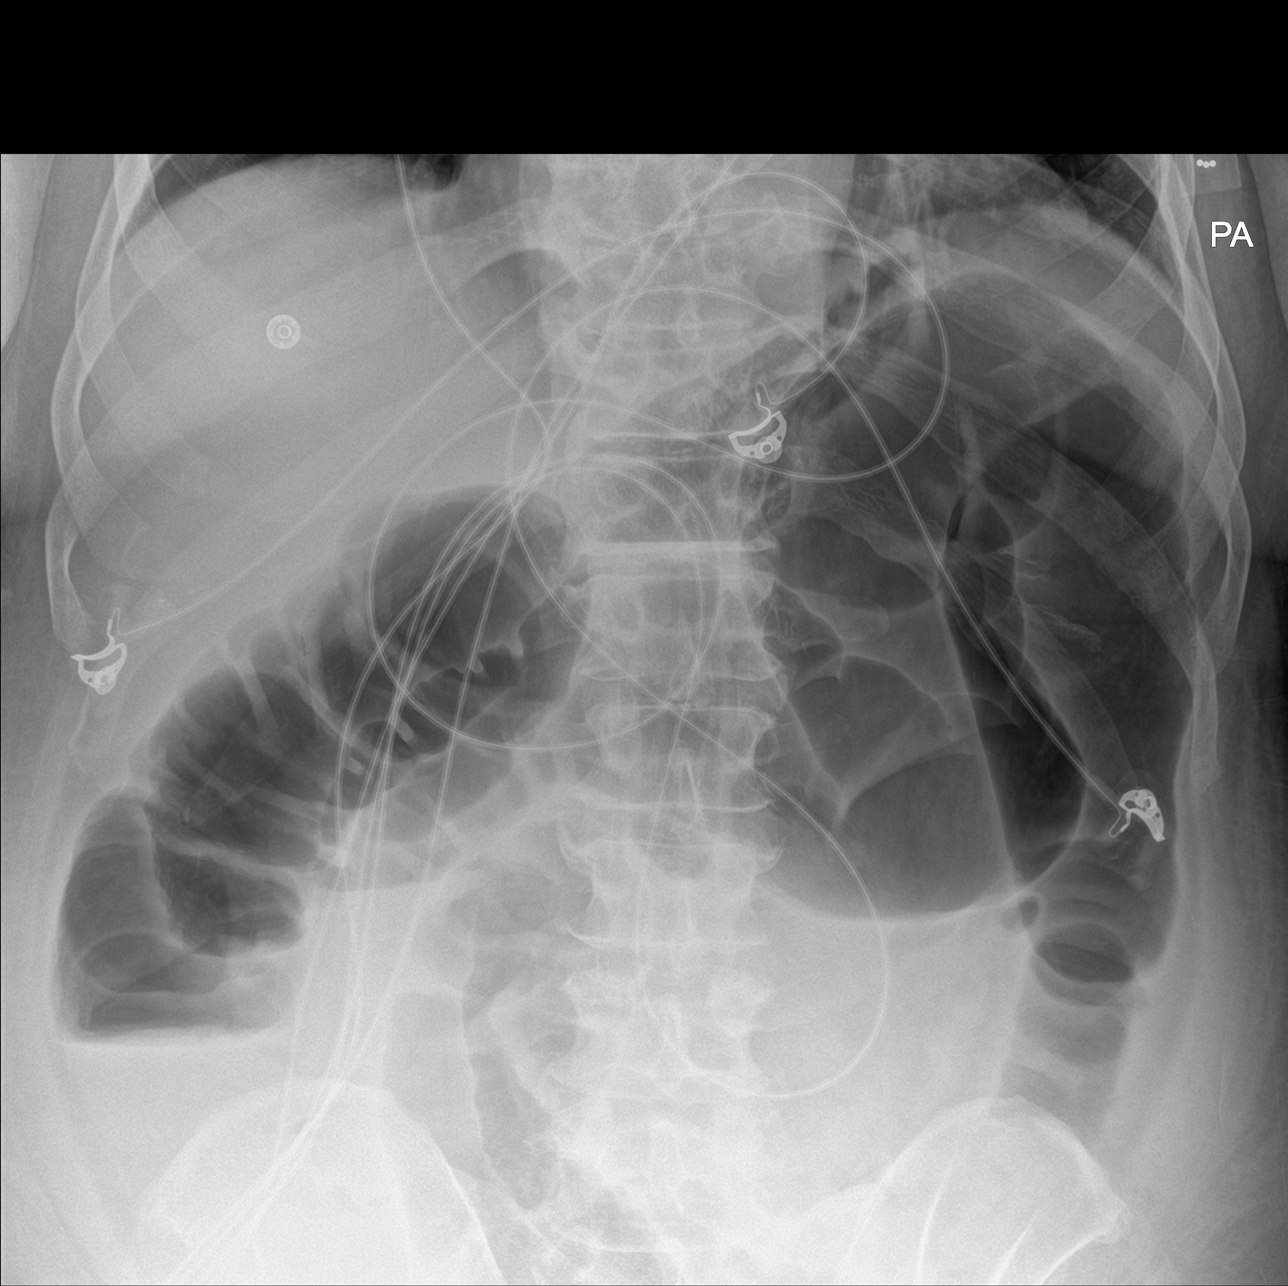

[abdomen supine (1 of 2)]
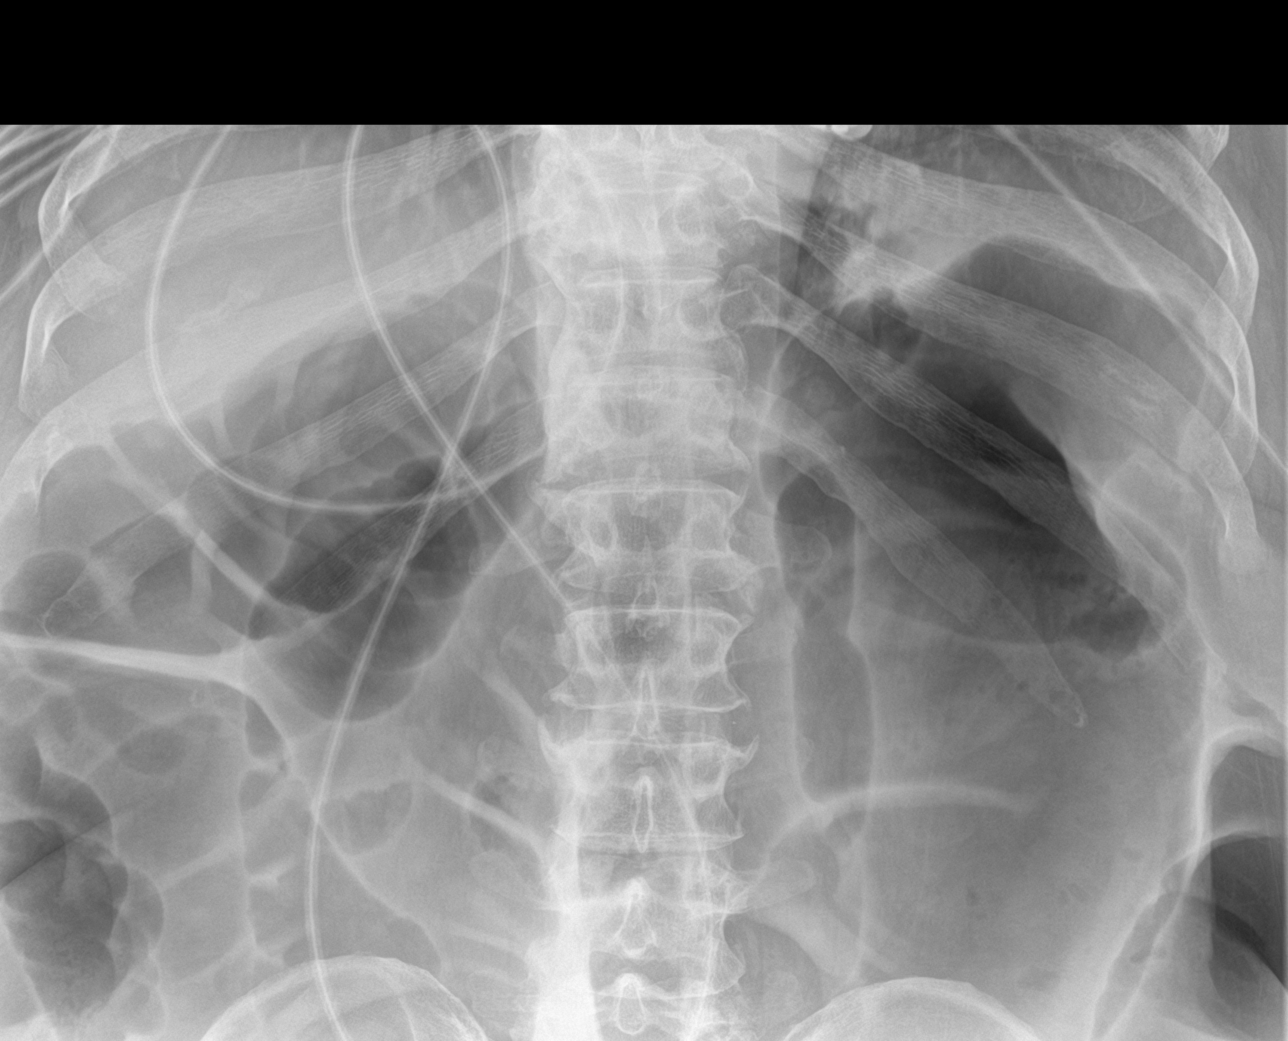

[abdomen supine (2 of 2)]
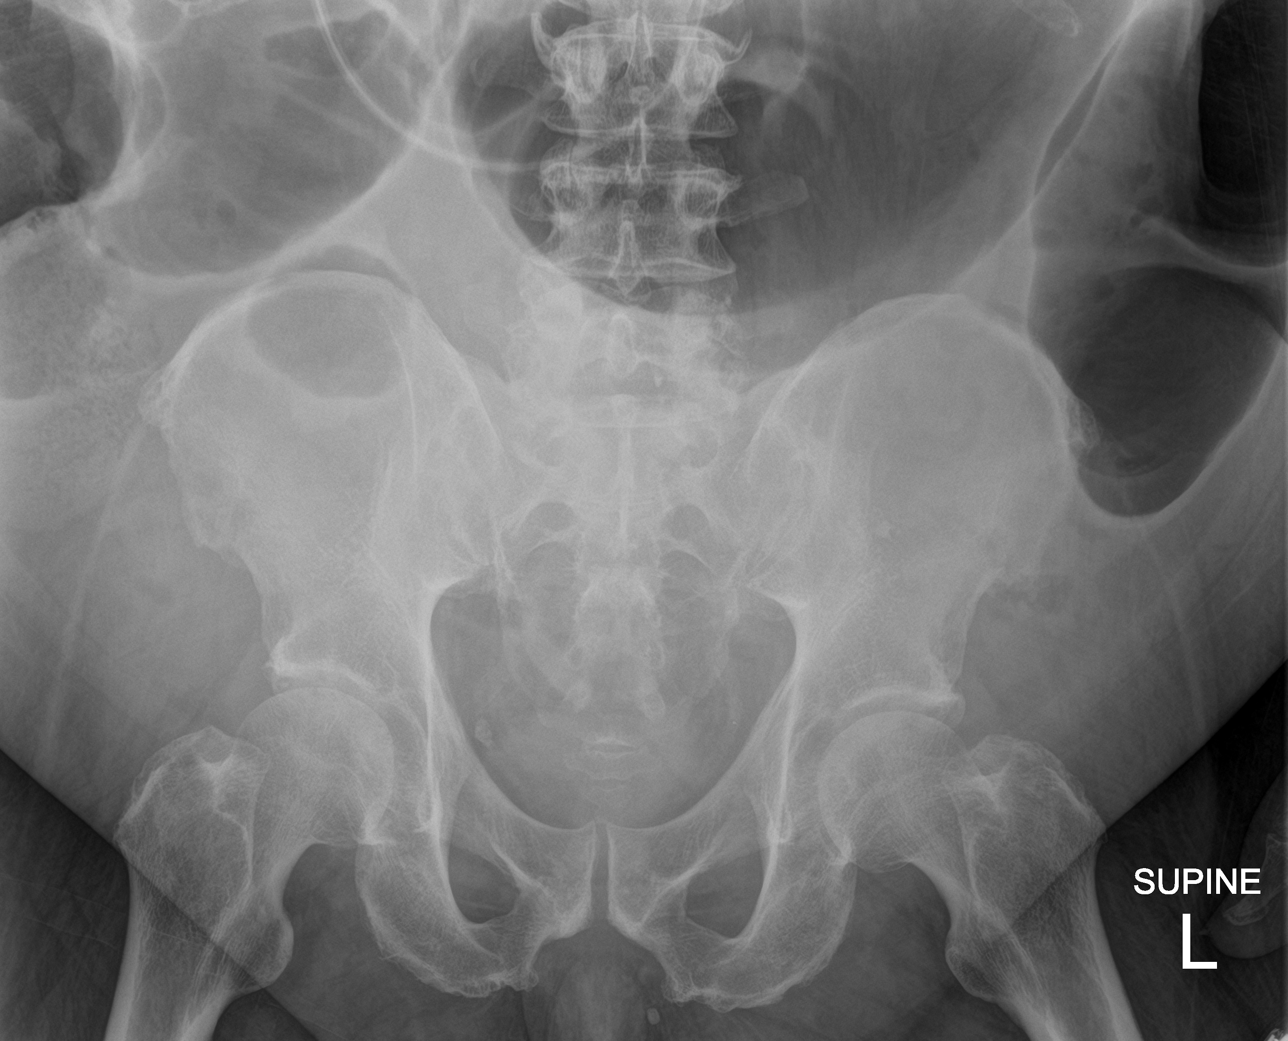

[3 of 3 positions shown; findings below may reference images not displayed]

FINDINGS: Stable dilated large and small bowel loops are noted concerning for
ileus. There is no evidence of free air. No radio-opaque calculi or
other significant radiographic abnormality is seen.
IMPRESSION: Stable dilated large and small bowel loops are noted concerning for
ileus.

## 2021-05-18 DIAGNOSIS — N182 Chronic kidney disease, stage 2 (mild): Secondary | ICD-10-CM | POA: Diagnosis not present

## 2021-05-18 DIAGNOSIS — M6281 Muscle weakness (generalized): Secondary | ICD-10-CM | POA: Diagnosis not present

## 2021-05-18 DIAGNOSIS — R2689 Other abnormalities of gait and mobility: Secondary | ICD-10-CM | POA: Diagnosis not present

## 2021-05-18 DIAGNOSIS — M109 Gout, unspecified: Secondary | ICD-10-CM | POA: Diagnosis not present

## 2021-05-30 ENCOUNTER — Telehealth: Payer: Self-pay | Admitting: Family Medicine

## 2021-05-30 NOTE — Telephone Encounter (Signed)
Routing to PCP for review.

## 2021-05-30 NOTE — Telephone Encounter (Signed)
Patient has not been seen since last year. Will have to route to provider for approval if appropriate.

## 2021-05-30 NOTE — Telephone Encounter (Signed)
Medication Refill - Medication:victoza and DYCLOSINAX SODIUM gel  Has the patient contacted their pharmacy? yes (Agent: If no, request that the patient contact the pharmacy for the refill.) (Agent: If yes, when and what did the pharmacy advise?)contact pcp  Preferred Pharmacy (with phone number or street name):  [facility] - [place], [place] Phone:  [phone]  Fax:  [phone]      Agent: Please be advised that RX refills may take up to 3 business days. We ask that you follow-up with your pharmacy.

## 2021-05-31 NOTE — Telephone Encounter (Signed)
Patient has been scheduled

## 2021-05-31 NOTE — Telephone Encounter (Signed)
Please schedule patient an appointment with PCP

## 2021-06-13 DIAGNOSIS — E7800 Pure hypercholesterolemia, unspecified: Secondary | ICD-10-CM | POA: Diagnosis not present

## 2021-06-13 DIAGNOSIS — M109 Gout, unspecified: Secondary | ICD-10-CM | POA: Diagnosis not present

## 2021-06-13 DIAGNOSIS — E1122 Type 2 diabetes mellitus with diabetic chronic kidney disease: Secondary | ICD-10-CM | POA: Diagnosis not present

## 2021-06-13 DIAGNOSIS — I10 Essential (primary) hypertension: Secondary | ICD-10-CM | POA: Diagnosis not present

## 2021-06-18 DIAGNOSIS — M109 Gout, unspecified: Secondary | ICD-10-CM | POA: Diagnosis not present

## 2021-06-18 DIAGNOSIS — R2689 Other abnormalities of gait and mobility: Secondary | ICD-10-CM | POA: Diagnosis not present

## 2021-06-18 DIAGNOSIS — N182 Chronic kidney disease, stage 2 (mild): Secondary | ICD-10-CM | POA: Diagnosis not present

## 2021-06-18 DIAGNOSIS — M6281 Muscle weakness (generalized): Secondary | ICD-10-CM | POA: Diagnosis not present

## 2021-06-26 ENCOUNTER — Other Ambulatory Visit: Payer: Self-pay

## 2021-06-26 ENCOUNTER — Encounter: Payer: Self-pay | Admitting: Family Medicine

## 2021-06-26 ENCOUNTER — Ambulatory Visit: Payer: Medicare HMO | Attending: Family Medicine | Admitting: Family Medicine

## 2021-06-26 DIAGNOSIS — E1169 Type 2 diabetes mellitus with other specified complication: Secondary | ICD-10-CM

## 2021-06-26 DIAGNOSIS — Z794 Long term (current) use of insulin: Secondary | ICD-10-CM

## 2021-06-26 DIAGNOSIS — E785 Hyperlipidemia, unspecified: Secondary | ICD-10-CM

## 2021-06-26 DIAGNOSIS — M1A00X0 Idiopathic chronic gout, unspecified site, without tophus (tophi): Secondary | ICD-10-CM

## 2021-06-26 DIAGNOSIS — I10 Essential (primary) hypertension: Secondary | ICD-10-CM | POA: Diagnosis not present

## 2021-06-26 MED ORDER — INSULIN ASPART PROT & ASPART (70-30 MIX) 100 UNIT/ML ~~LOC~~ SUSP: 50.0000 [IU] | Freq: Two times a day (BID) | SUBCUTANEOUS | 6 refills | Status: AC

## 2021-06-26 MED ORDER — VICTOZA 18 MG/3ML ~~LOC~~ SOPN: 1.8000 mg | PEN_INJECTOR | Freq: Every day | SUBCUTANEOUS | 3 refills | Status: DC

## 2021-06-26 MED ORDER — DICLOFENAC SODIUM 1 % EX GEL: 4.0000 g | Freq: Four times a day (QID) | CUTANEOUS | 1 refills | Status: DC

## 2021-06-26 NOTE — Progress Notes (Signed)
Virtual Visit via Telephone Note  I connected with [name], on 06/26/2021 at 2:28 PM by telephone due to the COVID-19 pandemic and verified that I am speaking with the correct person using two identifiers.   Consent: I discussed the limitations, risks, security and privacy concerns of performing an evaluation and management service by telephone and the availability of in person appointments. I also discussed with the patient that there may be a patient responsible charge related to this service. The patient expressed understanding and agreed to proceed.   Location of Patient: Home  Location of Provider: Clinic   Persons participating in Telemedicine visit: [name] Dr. Margarita Rana     History of Present Illness: [name] is a 60 y.o. year old male with a history of type 2 diabetes mellitus(A1c 6.1), hypertension, hyperlipidemia, gout, osteoarthritis of the knee (status post previous bilateral knee arthroscopy)   Blood sugars are around 130 fasting and he denies hypoglycemic episodes He administers Novolog 70/30,  50 units bid and also doing Victoza.  Lantus appears at his med list but he states he has not been taking this.  His last visit with me was 13 months ago. Denies gout flares. Compliant with his antihypertensive and statin and has no additional concerns today.  Past Medical History:  Diagnosis Date   Gouty bursitis, elbow  10/18/2015   Hyperlipidemia    Hypertension    Knee pain, chronic    Type II diabetes mellitus (HCC)    Allergies  Allergen Reactions   Hctz [Hydrochlorothiazide]     Hx of gout; uric acid 14.1 on 03/13/20 Acute gout L elbow 8/11-8/24/21    Current Outpatient Medications on File Prior to Visit  Medication Sig Dispense Refill   Accu-Chek Softclix Lancets lancets TEST BLOOD SUGAR THREE TIMES DAILY AS DIRECTED 100 each 0   Alcohol Swabs (B-D SINGLE USE SWABS REGULAR) PADS Please use as instructed before checking blood sugar and  before injecting insulin. 100 each 11   allopurinol (ZYLOPRIM) 300 MG tablet Take 1 tablet (300 mg total) by mouth daily. 30 tablet 0   amLODipine (NORVASC) 10 MG tablet Take 1 tablet (10 mg total) by mouth daily.     apixaban (ELIQUIS) 5 MG TABS tablet Take 1 tablet (5 mg total) by mouth 2 (two) times daily. 60 tablet 0   atorvastatin (LIPITOR) 40 MG tablet Take 1 tablet (40 mg total) by mouth daily at 6 PM. 30 tablet 0   Blood Glucose Calibration (ACCU-CHEK AVIVA) SOLN Use to calibrate your glucometer when needed. (Patient not taking: Reported on 07/11/2020) 1 each 0   Blood Glucose Monitoring Suppl (ACCU-CHEK AVIVA PLUS) w/Device KIT USE AS INSTRUCTED THREE TIMES DAILY. E11.69 (Patient not taking: Reported on 07/11/2020) 1 kit 0   ferrous sulfate 325 (65 FE) MG tablet Take 1 tablet (325 mg total) by mouth daily with breakfast. For Iron Deficiency Anemia 30 tablet 0   glucose blood (ACCU-CHEK AVIVA PLUS) test strip TEST BLOOD SUGAR THREE TIMES DAILY AS DIRECTED 100 each 0   insulin aspart (NOVOLOG) 100 UNIT/ML injection Inject 0-15 Units into the skin 3 (three) times daily with meals. CBG < 70: Implement Hypoglycemia Standing Orders and refer to Hypoglycemia Standing Orders sidebar report CBG 70 - 120: 0 units CBG 121 - 150: 2 units CBG 151 - 200: 3 units CBG 201 - 250: 5 units CBG 251 - 300: 8 units CBG 301 - 350: 11 units CBG 351 - 400: 15 units  CBG > 400: call MD.     insulin glargine (LANTUS) 100 UNIT/ML injection Inject 0.07 mLs (7 Units total) into the skin at bedtime.     Magnesium Oxide 200 MG TABS Take 2 tablets (400 mg total) by mouth daily. Take 2 tabs to = 400 mg 30 tablet 0   metoprolol succinate (TOPROL-XL) 50 MG 24 hr tablet Take 1.5 tablets (75 mg total) by mouth daily. Take with or immediately following a meal.     Multiple Vitamin (MULTIVITAMIN WITH MINERALS) TABS tablet Take 1 tablet by mouth daily.     Nutritional Supplements (,FEEDING SUPPLEMENT, PROSOURCE PLUS) liquid Take  30 mLs by mouth 2 (two) times daily between meals.     Nutritional Supplements (FEEDING SUPPLEMENT, KATE FARMS STANDARD 1.4,) LIQD liquid Take 325 mLs by mouth daily.     pantoprazole (PROTONIX) 40 MG tablet Take 1 tablet (40 mg total) by mouth daily.     polyethylene glycol (MIRALAX / GLYCOLAX) 17 g packet Take 17 g by mouth daily.     No current facility-administered medications on file prior to visit.    ROS: See HPI  Observations/Objective: Awake, alert, oriented x3 Not in acute distress Normal mood  CMP Latest Ref Rng & Units 09/08/2020 09/07/2020 09/06/2020  Glucose 70 - 99 mg/dL 124(H) 114(H) 138(H)  BUN 6 - 20 mg/dL _0 Creatinine 0.61 - 1.24 mg/dL 0.72 0.64 0.66  Sodium 135 - 145 mmol/L 140 136 134(L)  Potassium 3.5 - 5.1 mmol/L 3.5 3.7 4.1  Chloride 98 - 111 mmol/L 105 99 97(L)  CO2 22 - 32 mmol/L _1 Calcium 8.9 - 10.3 mg/dL 8.0(L) 8.2(L) 8.2(L)  Total Protein 6.5 - 8.1 g/dL 5.3(L) 5.8(L) -  Total Bilirubin 0.3 - 1.2 mg/dL 0.7 0.7 -  Alkaline Phos 38 - 126 U/L 76 84 -  AST 15 - 41 U/L 25 44(H) -  ALT 0 - 44 U/L 71(H) 101(H) -    Lipid Panel     Component Value Date/Time   CHOL 114 03/13/2020 1420   TRIG 85 09/04/2020 0408   HDL 29 (L) 03/13/2020 1420   CHOLHDL 3.9 03/13/2020 1420   CHOLHDL 4.5 10/10/2016 1125   VLDL 23 10/10/2016 1125   LDLCALC 61 03/13/2020 1420   LABVLDL 24 03/13/2020 1420    Lab Results  Component Value Date   HGBA1C 7.1 (H) 06/28/2020    Assessment and Plan: 1. Type 2 diabetes mellitus with other specified complication, with long-term current use of insulin (HCC) Controlled with A1c of 7.1 He is due for an A1c which I have ordered Have sent prescription for NovoLog 70/30 to his pharmacy and discontinued Lantus Counseled on Diabetic diet, my plate method, 161 minutes of moderate intensity exercise/week Blood sugar logs with fasting goals of 80-120 mg/dl, random of less than 180 and in the event of sugars less than 60  mg/dl or greater than 400 mg/dl encouraged to notify the clinic. Advised on the need for annual eye exams, annual foot exams, Pneumonia vaccine. - CMP14+EGFR; Future - Lipid panel; Future - Microalbumin / creatinine urine ratio; Future - Hemoglobin A1c; Future  2. Dyslipidemia associated with type 2 diabetes mellitus (HCC) Controlled Low-cholesterol diet Continue statin  3. Essential hypertension Controlled Continue metoprolol  4. Idiopathic chronic gout without tophus, unspecified site No recent flares Continue low purine eating plan   Follow Up Instructions: 6 months   I discussed the assessment and treatment plan with the patient. The  patient was provided an opportunity to ask questions and all were answered. The patient agreed with the plan and demonstrated an understanding of the instructions.   The patient was advised to call back or seek an in-person evaluation if the symptoms worsen or if the condition fails to improve as anticipated.     I provided 13 minutes total of non-face-to-face time during this encounter.   Charlott Rakes, MD, FAAFP. [facility] [place], [place]   06/26/2021, 2:28 PM

## 2021-07-04 ENCOUNTER — Ambulatory Visit: Payer: Medicare HMO | Attending: Family Medicine

## 2021-07-04 ENCOUNTER — Other Ambulatory Visit: Payer: Self-pay

## 2021-07-04 DIAGNOSIS — E1169 Type 2 diabetes mellitus with other specified complication: Secondary | ICD-10-CM

## 2021-07-04 DIAGNOSIS — Z794 Long term (current) use of insulin: Secondary | ICD-10-CM

## 2021-07-05 LAB — MICROALBUMIN / CREATININE URINE RATIO
Creatinine, Urine: 101.3 mg/dL
Microalb/Creat Ratio: 15 mg/g creat (ref 0–29)
Microalbumin, Urine: 15.4 ug/mL

## 2021-07-05 LAB — CMP14+EGFR
ALT: 29 IU/L (ref 0–44)
AST: 25 IU/L (ref 0–40)
Albumin/Globulin Ratio: 1.5 (ref 1.2–2.2)
Albumin: 4.6 g/dL (ref 3.8–4.8)
Alkaline Phosphatase: 132 IU/L — ABNORMAL HIGH (ref 44–121)
BUN/Creatinine Ratio: 14 (ref 10–24)
BUN: 16 mg/dL (ref 8–27)
Bilirubin Total: 0.5 mg/dL (ref 0.0–1.2)
CO2: 23 mmol/L (ref 20–29)
Calcium: 9.5 mg/dL (ref 8.6–10.2)
Chloride: 100 mmol/L (ref 96–106)
Creatinine, Ser: 1.14 mg/dL (ref 0.76–1.27)
Globulin, Total: 3.1 g/dL (ref 1.5–4.5)
Glucose: 143 mg/dL — ABNORMAL HIGH (ref 65–99)
Potassium: 4.4 mmol/L (ref 3.5–5.2)
Sodium: 139 mmol/L (ref 134–144)
Total Protein: 7.7 g/dL (ref 6.0–8.5)
eGFR: 73 mL/min/{1.73_m2} (ref >59)

## 2021-07-05 LAB — LIPID PANEL
Chol/HDL Ratio: 3.9 ratio (ref 0.0–5.0)
Cholesterol, Total: 154 mg/dL (ref 100–199)
HDL: 39 mg/dL — ABNORMAL LOW (ref >39)
LDL Chol Calc (NIH): 84 mg/dL (ref 0–99)
Triglycerides: 180 mg/dL — ABNORMAL HIGH (ref 0–149)
VLDL Cholesterol Cal: 31 mg/dL (ref 5–40)

## 2021-07-05 LAB — HEMOGLOBIN A1C
Est. average glucose Bld gHb Est-mCnc: 194 mg/dL
Hgb A1c MFr Bld: 8.4 % — ABNORMAL HIGH (ref 4.8–5.6)

## 2021-07-13 ENCOUNTER — Telehealth: Payer: Self-pay

## 2021-07-13 NOTE — Telephone Encounter (Signed)
Patient was called and a voicemail was left informing patient to return phone call for lab results. 

## 2021-07-13 NOTE — Telephone Encounter (Signed)
-----   Message from Charlott Rakes, MD sent at 07/05/2021  7:37 PM EDT ----- Please advise to increase his Novolog 70/30 to 52 units twice daily as his A1c is 8.4. Other labs are normal. Continue with a Diabetic diet

## 2021-07-22 ENCOUNTER — Telehealth: Payer: Self-pay

## 2021-07-22 NOTE — Telephone Encounter (Signed)
Lmom for patient to ret call. 

## 2021-10-08 ENCOUNTER — Other Ambulatory Visit: Payer: Self-pay | Admitting: Family Medicine

## 2021-10-08 DIAGNOSIS — E1169 Type 2 diabetes mellitus with other specified complication: Secondary | ICD-10-CM

## 2021-11-26 ENCOUNTER — Other Ambulatory Visit: Payer: Self-pay | Admitting: Family Medicine

## 2021-11-26 DIAGNOSIS — Z794 Long term (current) use of insulin: Secondary | ICD-10-CM

## 2021-11-26 DIAGNOSIS — M1A00X0 Idiopathic chronic gout, unspecified site, without tophus (tophi): Secondary | ICD-10-CM

## 2021-11-26 DIAGNOSIS — E1169 Type 2 diabetes mellitus with other specified complication: Secondary | ICD-10-CM

## 2021-11-26 NOTE — Telephone Encounter (Signed)
Requested Prescriptions  Pending Prescriptions Disp Refills   Accu-Chek Softclix Lancets lancets [Pharmacy Med Name: ACCU-CHEK SOFTCLIX LANCETS] 100 each 0    Sig: TEST BLOOD SUGAR THREE TIMES DAILY AS DIRECTED (APPOINTMENT IS NEEDED FOR REFILLS)     Endocrinology: Diabetes - Testing Supplies Passed - 11/26/2021 10:23 AM      Passed - Valid encounter within last 12 months    Recent Outpatient Visits          5 months ago Type 2 diabetes mellitus with other specified complication, with long-term current use of insulin ([place])   [place], [place], MD   1 year ago Type 2 diabetes mellitus with other specified complication, with long-term current use of insulin ([place])   [place], [place], MD   1 year ago Type 2 diabetes mellitus with other specified complication, with long-term current use of insulin ([place])   [place], Charlane Ferretti, MD   1 year ago Right elbow pain   [place], [place], [place]   2 years ago Acute idiopathic gout of right hand   [place], [place]              diclofenac Sodium (VOLTAREN) 1 % GEL [Pharmacy Med Name: DICLOFENAC SODIUM 1 % Gel] 200 g 0    Sig: APPLY 4 GRAMS TOPICALLY AS INSTRUCTED FOUR TIMES DAILY.     Analgesics:  Topicals Passed - 11/26/2021 10:23 AM      Passed - Valid encounter within last 12 months    Recent Outpatient Visits          5 months ago Type 2 diabetes mellitus with other specified complication, with long-term current use of insulin ([place])   [place], [place], MD   1 year ago Type 2 diabetes mellitus with other specified complication, with long-term current use of insulin ([place])   [place], Charlane Ferretti, MD   1 year ago Type 2 diabetes mellitus with other specified  complication, with long-term current use of insulin ([place])   [place], Enobong, MD   1 year ago Right elbow pain   [place], [place]   2 years ago Acute idiopathic gout of right hand   [place], [place], [place]

## 2022-01-24 DIAGNOSIS — I10 Essential (primary) hypertension: Secondary | ICD-10-CM | POA: Diagnosis not present

## 2022-01-24 DIAGNOSIS — E7800 Pure hypercholesterolemia, unspecified: Secondary | ICD-10-CM | POA: Diagnosis not present

## 2022-01-24 DIAGNOSIS — Z125 Encounter for screening for malignant neoplasm of prostate: Secondary | ICD-10-CM | POA: Diagnosis not present

## 2022-01-24 DIAGNOSIS — K219 Gastro-esophageal reflux disease without esophagitis: Secondary | ICD-10-CM | POA: Diagnosis not present

## 2022-01-24 DIAGNOSIS — E1122 Type 2 diabetes mellitus with diabetic chronic kidney disease: Secondary | ICD-10-CM | POA: Diagnosis not present

## 2022-01-24 DIAGNOSIS — M109 Gout, unspecified: Secondary | ICD-10-CM | POA: Diagnosis not present

## 2022-01-31 DIAGNOSIS — E1140 Type 2 diabetes mellitus with diabetic neuropathy, unspecified: Secondary | ICD-10-CM | POA: Diagnosis not present

## 2022-01-31 DIAGNOSIS — I872 Venous insufficiency (chronic) (peripheral): Secondary | ICD-10-CM | POA: Diagnosis not present

## 2022-01-31 DIAGNOSIS — M109 Gout, unspecified: Secondary | ICD-10-CM | POA: Diagnosis not present

## 2022-01-31 DIAGNOSIS — E7800 Pure hypercholesterolemia, unspecified: Secondary | ICD-10-CM | POA: Diagnosis not present

## 2022-01-31 DIAGNOSIS — Z0000 Encounter for general adult medical examination without abnormal findings: Secondary | ICD-10-CM | POA: Diagnosis not present

## 2022-01-31 DIAGNOSIS — I10 Essential (primary) hypertension: Secondary | ICD-10-CM | POA: Diagnosis not present

## 2022-02-21 DIAGNOSIS — E7800 Pure hypercholesterolemia, unspecified: Secondary | ICD-10-CM | POA: Diagnosis not present

## 2022-02-21 DIAGNOSIS — K219 Gastro-esophageal reflux disease without esophagitis: Secondary | ICD-10-CM | POA: Diagnosis not present

## 2022-02-21 DIAGNOSIS — D649 Anemia, unspecified: Secondary | ICD-10-CM | POA: Diagnosis not present

## 2022-02-21 DIAGNOSIS — Z794 Long term (current) use of insulin: Secondary | ICD-10-CM | POA: Diagnosis not present

## 2022-02-21 DIAGNOSIS — E1140 Type 2 diabetes mellitus with diabetic neuropathy, unspecified: Secondary | ICD-10-CM | POA: Diagnosis not present

## 2022-02-21 DIAGNOSIS — I10 Essential (primary) hypertension: Secondary | ICD-10-CM | POA: Diagnosis not present

## 2022-02-21 DIAGNOSIS — M109 Gout, unspecified: Secondary | ICD-10-CM | POA: Diagnosis not present

## 2022-05-08 DIAGNOSIS — E7800 Pure hypercholesterolemia, unspecified: Secondary | ICD-10-CM | POA: Diagnosis not present

## 2022-05-08 DIAGNOSIS — D649 Anemia, unspecified: Secondary | ICD-10-CM | POA: Diagnosis not present

## 2022-05-08 DIAGNOSIS — I10 Essential (primary) hypertension: Secondary | ICD-10-CM | POA: Diagnosis not present

## 2022-05-08 DIAGNOSIS — M109 Gout, unspecified: Secondary | ICD-10-CM | POA: Diagnosis not present

## 2022-05-08 DIAGNOSIS — K219 Gastro-esophageal reflux disease without esophagitis: Secondary | ICD-10-CM | POA: Diagnosis not present

## 2022-05-08 DIAGNOSIS — E1140 Type 2 diabetes mellitus with diabetic neuropathy, unspecified: Secondary | ICD-10-CM | POA: Diagnosis not present

## 2022-05-15 DIAGNOSIS — M109 Gout, unspecified: Secondary | ICD-10-CM | POA: Diagnosis not present

## 2022-05-15 DIAGNOSIS — D649 Anemia, unspecified: Secondary | ICD-10-CM | POA: Diagnosis not present

## 2022-05-15 DIAGNOSIS — Z6841 Body Mass Index (BMI) 40.0 and over, adult: Secondary | ICD-10-CM | POA: Diagnosis not present

## 2022-05-15 DIAGNOSIS — I10 Essential (primary) hypertension: Secondary | ICD-10-CM | POA: Diagnosis not present

## 2022-05-15 DIAGNOSIS — E1140 Type 2 diabetes mellitus with diabetic neuropathy, unspecified: Secondary | ICD-10-CM | POA: Diagnosis not present

## 2022-05-15 DIAGNOSIS — K219 Gastro-esophageal reflux disease without esophagitis: Secondary | ICD-10-CM | POA: Diagnosis not present

## 2022-05-15 DIAGNOSIS — E7800 Pure hypercholesterolemia, unspecified: Secondary | ICD-10-CM | POA: Diagnosis not present

## 2022-05-15 DIAGNOSIS — G4700 Insomnia, unspecified: Secondary | ICD-10-CM | POA: Diagnosis not present

## 2022-06-18 ENCOUNTER — Other Ambulatory Visit: Payer: Self-pay | Admitting: *Deleted

## 2022-06-18 NOTE — Patient Outreach (Signed)
  Care Coordination   Initial Visit Note   06/18/2022 Name: [name] MRN: [id number] DOB: [birth year]  [name] is a 61 y.o. year old male who sees Charlott Rakes, MD for primary care. I  left voicemail message   SDOH assessments and interventions completed:   No   Care Coordination Interventions Activated:  No  Encounter Outcome:  Unsuccesful  1st attempt  [place] [facility] [phone]

## 2022-08-08 DIAGNOSIS — E1140 Type 2 diabetes mellitus with diabetic neuropathy, unspecified: Secondary | ICD-10-CM | POA: Diagnosis not present

## 2022-08-08 DIAGNOSIS — K219 Gastro-esophageal reflux disease without esophagitis: Secondary | ICD-10-CM | POA: Diagnosis not present

## 2022-08-08 DIAGNOSIS — D649 Anemia, unspecified: Secondary | ICD-10-CM | POA: Diagnosis not present

## 2022-08-08 DIAGNOSIS — I10 Essential (primary) hypertension: Secondary | ICD-10-CM | POA: Diagnosis not present

## 2022-08-08 DIAGNOSIS — E7800 Pure hypercholesterolemia, unspecified: Secondary | ICD-10-CM | POA: Diagnosis not present

## 2022-08-20 ENCOUNTER — Ambulatory Visit (INDEPENDENT_AMBULATORY_CARE_PROVIDER_SITE_OTHER): Payer: Medicare HMO | Admitting: Podiatry

## 2022-08-20 ENCOUNTER — Encounter: Payer: Self-pay | Admitting: Podiatry

## 2022-08-20 DIAGNOSIS — E1149 Type 2 diabetes mellitus with other diabetic neurological complication: Secondary | ICD-10-CM | POA: Diagnosis not present

## 2022-08-20 DIAGNOSIS — N182 Chronic kidney disease, stage 2 (mild): Secondary | ICD-10-CM | POA: Diagnosis not present

## 2022-08-20 NOTE — Progress Notes (Addendum)
This patient returns to my office for at risk foot care.  This patient requires this care by a professional since this patient will be at risk due to having diabetes  and kidney disease.  This patient is unable to cut nails himself since the patient cannot reach his nails.These nails are painful walking and wearing shoes.  This patient presents for at risk foot care today.  General Appearance  Alert, conversant and in no acute stress.  Vascular  Dorsalis pedis and posterior tibial  pulses are  not palpable  due to swelling. bilaterally.  Capillary return is within normal limits  bilaterally. Temperature is within normal limits  bilaterally.  Neurologic  Senn-Weinstein monofilament wire test within normal limits  bilaterally. Muscle power within normal limits bilaterally.  Nails Thick disfigured discolored nails with subungual debris  from hallux to fifth toes bilaterally. No evidence of bacterial infection or drainage bilaterally. Rams horn nails.  Orthopedic  No limitations of motion  feet .  No crepitus or effusions noted.  No bony pathology or digital deformities noted.  Skin  normotropic skin with no porokeratosis noted bilaterally.  No signs of infections or ulcers noted.     Onychomycosis  Pain in right toes  Pain in left toes  Consent was obtained for treatment procedures.   Mechanical debridement of nails 1-5  bilaterally performed with a nail nipper.  Filed with dremel without incident.    Return office visit      3 mos.               Told patient to return for periodic foot care and evaluation due to potential at risk complications.   Gardiner Barefoot DPM

## 2022-09-04 ENCOUNTER — Telehealth: Payer: Self-pay | Admitting: *Deleted

## 2022-09-04 NOTE — Chronic Care Management (AMB) (Signed)
  Care Coordination  Outreach Note  09/04/2022 Name: [name] MRN: [id number] DOB: [birth year]   Care Coordination Outreach Attempts: An unsuccessful telephone outreach was attempted today to offer the patient information about available care coordination services as a benefit of their health plan.   Follow Up Plan:  Additional outreach attempts will be made to offer the patient care coordination information and services.   Encounter Outcome:  No Answer  [place]  Direct Dial: [phone]

## 2022-09-11 NOTE — Chronic Care Management (AMB) (Signed)
  Care Coordination   Note   09/11/2022 Name: RODDIE RIEGLER MRN: [id number] DOB: 1961/08/19  GIULIANO PREECE is a 61 y.o. year old male who sees Charlott Rakes, MD for primary care. I reached out to [name] by phone today to offer care coordination services.  Mr. Magnan was given information about Care Coordination services today including:   The Care Coordination services include support from the care team which includes your Nurse Coordinator, Clinical Social Worker, or Pharmacist.  The Care Coordination team is here to help remove barriers to the health concerns and goals most important to you. Care Coordination services are voluntary, and the patient may decline or stop services at any time by request to their care team member.   Care Coordination Consent Status: Patient agreed to services and verbal consent obtained.   Follow up plan:  Telephone appointment with care coordination team member scheduled for:  09/30/22  Encounter Outcome:  Pt. Scheduled  [place]  Direct Dial: [phone]

## 2022-09-11 NOTE — Chronic Care Management (AMB) (Signed)
  Care Coordination  Outreach Note  09/11/2022 Name: [name] MRN: [id number] DOB: 1961/11/08   Care Coordination Outreach Attempts: A second unsuccessful outreach was attempted today to offer the patient with information about available care coordination services as a benefit of their health plan.     Follow Up Plan:  Additional outreach attempts will be made to offer the patient care coordination information and services.   Encounter Outcome:  No Answer  [place]  Direct Dial: [phone]

## 2022-09-30 ENCOUNTER — Ambulatory Visit: Payer: Self-pay

## 2022-09-30 NOTE — Patient Outreach (Signed)
  Care Coordination   09/30/2022 Name: [name] MRN: [id number] DOB: [birth year]   Care Coordination Outreach Attempts:  An unsuccessful telephone outreach was attempted for a scheduled appointment today.  Follow Up Plan:  Additional outreach attempts will be made to offer the patient care coordination information and services.   Encounter Outcome:  No Answer  Care Coordination Interventions Activated:  No   Care Coordination Interventions:  No, not indicated    Barb Merino, RN, BSN, CCM Care Management Coordinator [facility] Care Management  Direct Phone: [phone]

## 2022-10-29 ENCOUNTER — Telehealth: Payer: Self-pay | Admitting: *Deleted

## 2022-10-29 NOTE — Progress Notes (Signed)
  Care Coordination Note  10/29/2022 Name: [name] MRN: [id number] DOB: [birth year]  [name] is a 61 y.o. year old male who is a primary care patient of Charlott Rakes, MD and is actively engaged with the care management team. I reached out to [name] by phone today to assist with re-scheduling a follow up visit with the RN Case Manager  Follow up plan: Unsuccessful telephone outreach attempt made. A HIPAA compliant phone message was left for the patient providing contact information and requesting a return call.   [place]  Direct Dial: [phone]

## 2022-11-19 NOTE — Progress Notes (Signed)
  Care Coordination Note  11/19/2022 Name: [name] MRN: [id number] DOB: [birth year]  [name] is a 62 y.o. year old male who is a primary care patient of Charlott Rakes, MD and is actively engaged with the care management team. I reached out to [name] by phone today to assist with re-scheduling an initial visit with the RN Case Manager  Follow up plan: Telephone appointment with care management team member scheduled for:12/09/22  [place]  Direct Dial: [phone]

## 2022-11-22 ENCOUNTER — Encounter: Payer: Self-pay | Admitting: Podiatry

## 2022-11-22 ENCOUNTER — Ambulatory Visit (INDEPENDENT_AMBULATORY_CARE_PROVIDER_SITE_OTHER): Payer: Medicare HMO | Admitting: Podiatry

## 2022-11-22 DIAGNOSIS — N182 Chronic kidney disease, stage 2 (mild): Secondary | ICD-10-CM | POA: Diagnosis not present

## 2022-11-22 DIAGNOSIS — B351 Tinea unguium: Secondary | ICD-10-CM

## 2022-11-22 DIAGNOSIS — M79675 Pain in left toe(s): Secondary | ICD-10-CM

## 2022-11-22 DIAGNOSIS — M79674 Pain in right toe(s): Secondary | ICD-10-CM

## 2022-11-22 DIAGNOSIS — E1142 Type 2 diabetes mellitus with diabetic polyneuropathy: Secondary | ICD-10-CM | POA: Diagnosis not present

## 2022-11-22 NOTE — Progress Notes (Signed)
This patient returns to my office for at risk foot care.  This patient requires this care by a professional since this patient will be at risk due to having diabetes  and kidney disease.  This patient is unable to cut nails himself since the patient cannot reach his nails.These nails are painful walking and wearing shoes.  This patient presents for at risk foot care today.  General Appearance  Alert, conversant and in no acute stress.  Vascular  Dorsalis pedis and posterior tibial  pulses are  not palpable  due to swelling. bilaterally.  Capillary return is within normal limits  bilaterally. Temperature is within normal limits  bilaterally.  Neurologic  Senn-Weinstein monofilament wire test within normal limits  bilaterally. Muscle power within normal limits bilaterally.  Nails Thick disfigured discolored nails with subungual debris  from hallux to fifth toes bilaterally. No evidence of bacterial infection or drainage bilaterally. Rams horn nails.  Orthopedic  No limitations of motion  feet .  No crepitus or effusions noted.  No bony pathology or digital deformities noted.  Skin  normotropic skin with no porokeratosis noted bilaterally.  No signs of infections or ulcers noted.     Onychomycosis  Pain in right toes  Pain in left toes  Consent was obtained for treatment procedures.   Mechanical debridement of nails 1-5  bilaterally performed with a nail nipper.  Filed with dremel without incident.    Return office visit      3 mos.               Told patient to return for periodic foot care and evaluation due to potential at risk complications.   Gardiner Barefoot DPM

## 2022-12-05 DIAGNOSIS — E1122 Type 2 diabetes mellitus with diabetic chronic kidney disease: Secondary | ICD-10-CM | POA: Diagnosis not present

## 2022-12-09 ENCOUNTER — Ambulatory Visit: Payer: Self-pay

## 2022-12-09 NOTE — Patient Outreach (Signed)
  Care Coordination   12/09/2022 Name: [name] MRN: [id number] DOB: Mar 19, 1961   Care Coordination Outreach Attempts:  An unsuccessful telephone outreach was attempted for a scheduled appointment today.  Follow Up Plan:  Additional outreach attempts will be made to offer the patient care coordination information and services.   Encounter Outcome:  No Answer   Care Coordination Interventions:  No, not indicated    Barb Merino, RN, BSN, CCM Care Management Coordinator [facility] Care Management  Direct Phone: [phone]

## 2022-12-12 DIAGNOSIS — I872 Venous insufficiency (chronic) (peripheral): Secondary | ICD-10-CM | POA: Diagnosis not present

## 2022-12-12 DIAGNOSIS — Z86718 Personal history of other venous thrombosis and embolism: Secondary | ICD-10-CM | POA: Diagnosis not present

## 2022-12-12 DIAGNOSIS — I10 Essential (primary) hypertension: Secondary | ICD-10-CM | POA: Diagnosis not present

## 2022-12-12 DIAGNOSIS — E1122 Type 2 diabetes mellitus with diabetic chronic kidney disease: Secondary | ICD-10-CM | POA: Diagnosis not present

## 2022-12-12 DIAGNOSIS — Z0000 Encounter for general adult medical examination without abnormal findings: Secondary | ICD-10-CM | POA: Diagnosis not present

## 2022-12-12 DIAGNOSIS — E1140 Type 2 diabetes mellitus with diabetic neuropathy, unspecified: Secondary | ICD-10-CM | POA: Diagnosis not present

## 2022-12-12 DIAGNOSIS — M109 Gout, unspecified: Secondary | ICD-10-CM | POA: Diagnosis not present

## 2022-12-12 DIAGNOSIS — K219 Gastro-esophageal reflux disease without esophagitis: Secondary | ICD-10-CM | POA: Diagnosis not present

## 2022-12-12 DIAGNOSIS — E7800 Pure hypercholesterolemia, unspecified: Secondary | ICD-10-CM | POA: Diagnosis not present

## 2022-12-16 ENCOUNTER — Telehealth: Payer: Self-pay

## 2022-12-16 NOTE — Patient Outreach (Signed)
  Care Coordination   12/16/2022 Name: JHASE CREPPEL MRN: [id number] DOB: [birth year]   Care Coordination Outreach Attempts:  An unsuccessful telephone outreach was attempted today to offer the patient information about available care coordination services as a benefit of their health plan.   Follow Up Plan:  Additional outreach attempts will be made to offer the patient care coordination information and services.   Encounter Outcome:  No Answer   Care Coordination Interventions:  No, not indicated    Jone Baseman, RN, MSN [place] Management Care Management Coordinator Direct Line [phone]

## 2022-12-17 ENCOUNTER — Telehealth: Payer: Self-pay

## 2022-12-17 NOTE — Patient Outreach (Signed)
  Care Coordination   12/17/2022 Name: HAZE ANTILLON MRN: [id number] DOB: 07-07-1961   Care Coordination Outreach Attempts:  A second unsuccessful outreach was attempted today to offer the patient with information about available care coordination services as a benefit of their health plan.     Follow Up Plan:  Additional outreach attempts will be made to offer the patient care coordination information and services.   Encounter Outcome:  No Answer   Care Coordination Interventions:  No, not indicated    Jone Baseman, RN, MSN [place] [facility] Care Management Coordinator Direct Line [phone]

## 2022-12-19 ENCOUNTER — Telehealth: Payer: Self-pay

## 2022-12-19 NOTE — Patient Outreach (Signed)
  Care Coordination   12/19/2022 Name: KAIKOA MAGRO MRN: [id number] DOB: 12/27/1960   Care Coordination Outreach Attempts:  A third unsuccessful outreach was attempted today to offer the patient with information about available care coordination services as a benefit of their health plan.   Follow Up Plan:  Additional outreach attempts will be made to offer the patient care coordination information and services.   Encounter Outcome:  No Answer   Care Coordination Interventions:  No, not indicated    Jone Baseman, RN, MSN [place] Management Care Management Coordinator Direct Line [phone]

## 2022-12-20 ENCOUNTER — Telehealth: Payer: Self-pay

## 2022-12-20 NOTE — Patient Outreach (Signed)
  Care Coordination   Follow Up Visit Note   12/20/2022 Name: [name] MRN: [id number] DOB: [birth year]  [name] is a 62 y.o. year old male who sees [name], [place] for primary care. I  sent a message to Dr. Theda Sers in [facility] [employer] works in response to referral on patient for assistance.  RNCM unable to reach patient and that RNCM will be closing case at this time.    What matters to the patients health and wellness today?  N/A    Goals Addressed   None     SDOH assessments and interventions completed:  No     Care Coordination Interventions:  Yes, provided   Follow up plan: No further intervention required.   Encounter Outcome:  Pt. Visit Completed   Jone Baseman, RN, MSN [place] Management Care Management Coordinator Direct Line [phone]

## 2022-12-24 ENCOUNTER — Telehealth: Payer: Self-pay

## 2022-12-24 DIAGNOSIS — E1165 Type 2 diabetes mellitus with hyperglycemia: Secondary | ICD-10-CM

## 2022-12-24 NOTE — Patient Outreach (Signed)
  Care Coordination   Initial Visit Note   12/24/2022 Name: [name] MRN: [id number] DOB: November 12, 1961  [name] is a 62 y.o. year old male who sees [name], [place] for primary care. I spoke with  [name] by phone today.  What matters to the patients health and wellness today?  Getting medications coordinated to one pharmacy.    Goals Addressed             This Visit's Progress    Diabetes Management       Patient Goals/Self Care Activities: -check blood sugar at prescribed times -record values and write them down take them to all doctor visits   Patient needing help with coordinating medications.pharmacy referral done. Medication at multiple pharmacies.  Patient catches the bus.  Discussed SCAT bus.  Referral completed.  Interventions Today    Flowsheet Row Most Recent Value  Chronic Disease Discussed/Reviewed   Chronic disease discussed/reviewed during today's visit Diabetes  General Interventions   General Interventions Discussed/Reviewed General Interventions Discussed, Doctor Visits  Doctor Visits Discussed/Reviewed Annual Wellness Visits, Doctor Visits Discussed  Exercise Interventions   Exercise Discussed/Reviewed Exercise Discussed  Education Interventions   Education Provided Provided Verbal Education  Provided Verbal Education On Blood Sugar Monitoring, Medication  Nutrition Interventions   Nutrition Discussed/Reviewed Decreasing sugar intake  Pharmacy Interventions   Pharmacy Dicussed/Reviewed Medications and their functions             SDOH assessments and interventions completed:  Yes  SDOH Interventions Today    Flowsheet Row Most Recent Value  SDOH Interventions   Food Insecurity Interventions Intervention Not Indicated  Housing Interventions Intervention Not Indicated  Transportation Interventions AMB Referral  [for SCAT]        Care Coordination Interventions:  Yes, provided    Follow up plan: Follow up call scheduled  for 12/31/22    Encounter Outcome:  Pt. Visit Completed   Jone Baseman, RN, MSN [place] Management Care Management Coordinator Direct Line [phone]

## 2022-12-24 NOTE — Patient Instructions (Signed)
Visit Information  Thank you for taking time to visit with me today. Please don't hesitate to contact me if I can be of assistance to you.   Following are the goals we discussed today:   Goals Addressed             This Visit's Progress    Diabetes Management       Patient Goals/Self Care Activities: -check blood sugar at prescribed times -record values and write them down take them to all doctor visits   Patient needing help with coordinating medications.pharmacy referral done. Medication at multiple pharmacies.  Patient catches the bus.  Discussed SCAT bus.  Referral completed.  Interventions Today    Flowsheet Row Most Recent Value  Chronic Disease Discussed/Reviewed   Chronic disease discussed/reviewed during today's visit Diabetes  General Interventions   General Interventions Discussed/Reviewed General Interventions Discussed, Doctor Visits  Doctor Visits Discussed/Reviewed Annual Wellness Visits, Doctor Visits Discussed  Exercise Interventions   Exercise Discussed/Reviewed Exercise Discussed  Education Interventions   Education Provided Provided Verbal Education  Provided Verbal Education On Blood Sugar Monitoring, Medication  Nutrition Interventions   Nutrition Discussed/Reviewed Decreasing sugar intake  Pharmacy Interventions   Pharmacy Dicussed/Reviewed Medications and their functions             Our next appointment is by telephone on 12/31/22 at 1030  Please call the care guide team at [phone] if you need to cancel or reschedule your appointment.   If you are experiencing a Mental Health or [place] or need someone to talk to, please call the Suicide and Crisis Lifeline: 988   The patient verbalized understanding of instructions, educational materials, and care plan provided today and agreed to receive a mailed copy of patient instructions, educational materials, and care plan.   The patient has been provided with contact information  for the care management team and has been advised to call with any health related questions or concerns.   Jone Baseman, RN, MSN [place] Management Care Management Coordinator Direct Line [phone]

## 2022-12-25 ENCOUNTER — Encounter: Payer: Self-pay | Admitting: *Deleted

## 2022-12-25 ENCOUNTER — Telehealth: Payer: Self-pay | Admitting: *Deleted

## 2022-12-25 ENCOUNTER — Telehealth: Payer: Self-pay

## 2022-12-25 NOTE — Telephone Encounter (Signed)
   Telephone encounter was:  Unsuccessful.  12/25/2022 Name: [name] MRN: [id number] DOB: 24-Nov-1960  Unsuccessful outbound call made today to assist with:  Transportation Needs  Mailed patient access [place] application will continue to call  Outreach Attempt:  1st Attempt  A HIPAA compliant voice message was left requesting a return call.  Instructed patient to call back at [phone]. [place] [phone] [address] , [place] [address] Email : [email or web address]

## 2022-12-25 NOTE — Patient Outreach (Signed)
  Care Coordination   Collaboration  Visit Note   12/25/2022 Name: [name] MRN: [id number] DOB: 10/07/1961  [name] is a 62 y.o. year old male who sees [name], [place] for primary care. I  sent a message to Dr. Theda Sers via E [employer] works to update patient status.    What matters to the patients health and wellness today?  N/A    Goals Addressed             This Visit's Progress    Diabetes Management       Patient Goals/Self Care Activities: -check blood sugar at prescribed times -record values and write them down take them to all doctor visits   Patient needing help with coordinating medications.pharmacy referral done. Medication at multiple pharmacies.  Patient catches the bus.  Discussed SCAT bus.  Referral completed.  Interventions Today    Flowsheet Row Most Recent Value  Chronic Disease Discussed/Reviewed   Chronic disease discussed/reviewed during today's visit Diabetes  General Interventions   General Interventions Discussed/Reviewed General Interventions Discussed, Doctor Visits  Doctor Visits Discussed/Reviewed Annual Wellness Visits, Doctor Visits Discussed  Exercise Interventions   Exercise Discussed/Reviewed Exercise Discussed  Education Interventions   Education Provided Provided Verbal Education  Provided Verbal Education On Blood Sugar Monitoring, Medication  Nutrition Interventions   Nutrition Discussed/Reviewed Decreasing sugar intake  Pharmacy Interventions   Pharmacy Dicussed/Reviewed Medications and their functions     12/25/22- Message sent via E clinical works to Dr. Theda Sers for update on patient outreach.         SDOH assessments and interventions completed:  Yes     Care Coordination Interventions:  Yes, provided   Follow up plan: Follow up call scheduled for next week.    Encounter Outcome:  Pt. Visit Completed   [name] Baseman, RN, MSN [place] Management Care Management Coordinator Direct Line [phone]

## 2022-12-26 ENCOUNTER — Telehealth: Payer: Self-pay | Admitting: *Deleted

## 2022-12-26 NOTE — Telephone Encounter (Signed)
   Telephone encounter was:  Unsuccessful.  12/26/2022 Name: DEARIS DANIS MRN: [id number] DOB: 09/09/1961  Unsuccessful outbound call made today to assist with:  Transportation Needs   Outreach Attempt:  2nd Attempt  A HIPAA compliant voice message was left requesting a return call.  Instructed patient to call back at [phone].  [place] [phone] [address] , [place] [address] Email : [email or web address]

## 2022-12-26 NOTE — Patient Outreach (Signed)
  Care Coordination   Initial Visit Note  Late Entry/12-25-22 12/26/2022 Name: [name] MRN: [id number] DOB: 29-Jun-1961  DARIC KOREN is a 62 y.o. year old male who sees [name], [place] for primary care. I spoke with  [name] by phone today.  What matters to the patients health and wellness today?  Trouble with getting all my medicines- all over town. Have to take the bus.    Goals Addressed             This Visit's Progress    Transportation       Care Coordination Interventions Reviewed Care Coordination Services:  Concerns with obtaining required medications: referral made to [facility] [place] with Erline Levine at PCP office, Jon Billings, RNCM Pt voiced no concerns for CSW assistance aside from transportation as he "take the bus" and does find it difficult at time to walk to the bus stop- Advised pt he may be eligible for the SCAT/[place] Access transportation service as well as Hum[employer] benefit-  Pt is not sure of his Medicaid status- he did read to me over the phone what sounded like a Medicaid ID # but not certain Pt is independent with ADL's and has a friend and her kids who live with him- she works  Advised patient to expect phone call from [facility] about the SCAT application that is to be completed and signed by PCP and sent for determination. Pt reports he gets $281 in Food Stamps monthly and denies any SDOH concerns. Pt reports he joined the [employer] and plans to go back once it warms up some Pt reports he does not go out a lot to socialize, church, etc out of fear and not feeling safe          SDOH assessments and interventions completed:  Yes  SDOH Interventions Today    Flowsheet Row Most Recent Value  SDOH Interventions   Housing Interventions Intervention Not Indicated  Utilities Interventions Intervention Not Indicated  Alcohol Usage Interventions Intervention Not Indicated (Score <7)   Financial Strain Interventions Intervention Not Indicated  Physical Activity Interventions [employer]  Stress Interventions Intervention Not Indicated  Social Connections Interventions Intervention Not Indicated, [employer]        Care Coordination Interventions:  Yes, provided   Follow up plan: Follow up call scheduled for 01/01/23    Encounter Outcome:  Pt. Visit Completed

## 2022-12-26 NOTE — Patient Instructions (Signed)
  Visit Information  Thank you for taking time to visit with me today. Please don't hesitate to contact me if I can be of assistance to you.   Following are the goals we discussed today:   Goals Addressed             This Visit's Progress    Transportation       Care Coordination Interventions Reviewed Care Coordination Services:  Concerns with obtaining required medications: referral made to [facility] [place] with Erline Levine at PCP office, Jon Billings, RNCM Pt voiced no concerns for CSW assistance aside from transportation as he "take the bus" and does find it difficult at time to walk to the bus stop- Advised pt he may be eligible for the SCAT/[place] Access transportation service as well as Hum[employer] benefit-  Pt is not sure of his Medicaid status- he did read to me over the phone what sounded like a Medicaid ID # but not certain Pt is independent with ADL's and has a friend and her kids who live with him- she works  Advised patient to expect phone call from [facility] about the SCAT application that is to be completed and signed by PCP and sent for determination. Pt reports he gets $281 in Food Stamps monthly and denies any SDOH concerns. Pt reports he joined the [employer] and plans to go back once it warms up some Pt reports he does not go out a lot to socialize, church, etc out of fear and not feeling safe          Our next appointment is by telephone on 01/01/23   Please call the care guide team at [phone] if you need to cancel or reschedule your appointment.   If you are experiencing a Mental Health or [place] or need someone to talk to, please call 911   The patient verbalized understanding of instructions, educational materials, and care plan provided today and DECLINED offer to receive copy of patient instructions, educational materials, and care plan.   Telephone follow up appointment with care  management team member scheduled for: 01/01/23  Eduard Clos, MSW, [place] Worker [employer] [phone]

## 2022-12-27 ENCOUNTER — Telehealth: Payer: Self-pay | Admitting: *Deleted

## 2022-12-27 NOTE — Telephone Encounter (Signed)
   Telephone encounter was:  Unsuccessful.  12/27/2022 Name: [name] MRN: [id number] DOB: 22-Jul-1961  Unsuccessful outbound call made today to assist with:  Transportation Needs   Outreach Attempt:  3rd Attempt.  Referral closed unable to contact patient. mailed application for transportation no answer 3x after leaving message  A HIPAA compliant voice message was left requesting a return call.  Instructed patient to call back at [phone]. [place] [phone] [address] , [place] [address] Email : [email or web address]

## 2022-12-31 ENCOUNTER — Ambulatory Visit: Payer: Self-pay

## 2022-12-31 NOTE — Patient Outreach (Signed)
  Care Coordination   Follow Up Visit Note   12/31/2022 Name: [name] MRN: [id number] DOB: 01-26-1961  [name] is a 62 y.o. year old male who sees [name], [place] for primary care. I spoke with  [name] by phone today.  What matters to the patients health and wellness today?  none    Goals Addressed             This Visit's Progress    Diabetes Management       Patient Goals/Self Care Activities: -check blood sugar at prescribed times -record values and write them down take them to all doctor visits   Patient needing help with coordinating medications.pharmacy referral done. Medication at multiple pharmacies.  Patient catches the bus.  SCAT application pending Interventions Today    Flowsheet Row Most Recent Value  Chronic Disease   Chronic disease during today's visit Diabetes  General Interventions   General Interventions Discussed/Reviewed General Interventions Reviewed  Exercise Interventions   Exercise Discussed/Reviewed Exercise Reviewed  Education Interventions   Education Provided Provided Education  Provided Verbal Education On Blood Sugar Monitoring, Foot Care  Nutrition Interventions   Nutrition Discussed/Reviewed Decreasing sugar intake, Nutrition Reviewed  Pharmacy Interventions   Pharmacy Dicussed/Reviewed Referral to Pharmacist  Referral to Pharmacist --  [Medication Management]             SDOH assessments and interventions completed:  Yes     Care Coordination Interventions:  Yes, provided   Follow up plan: Follow up call scheduled for 01/14/23    Encounter Outcome:  Pt. Visit Completed   Jone Baseman, RN, MSN [place] Management Care Management Coordinator Direct Line [phone]

## 2022-12-31 NOTE — Patient Instructions (Signed)
Visit Information  Thank you for taking time to visit with me today. Please don't hesitate to contact me if I can be of assistance to you.   Following are the goals we discussed today:   Goals Addressed             This Visit's Progress    Diabetes Management       Patient Goals/Self Care Activities: -check blood sugar at prescribed times -record values and write them down take them to all doctor visits   Patient needing help with coordinating medications.pharmacy referral done. Medication at multiple pharmacies.  Patient catches the bus.  SCAT application pending Interventions Today    Flowsheet Row Most Recent Value  Chronic Disease   Chronic disease during today's visit Diabetes  General Interventions   General Interventions Discussed/Reviewed General Interventions Reviewed  Exercise Interventions   Exercise Discussed/Reviewed Exercise Reviewed  Education Interventions   Education Provided Provided Education  Provided Verbal Education On Blood Sugar Monitoring, Foot Care  Nutrition Interventions   Nutrition Discussed/Reviewed Decreasing sugar intake, Nutrition Reviewed  Pharmacy Interventions   Pharmacy Dicussed/Reviewed Referral to Pharmacist  Referral to Pharmacist --  [Medication Management]             Our next appointment is by telephone on 01/14/23 at 1030  Please call the care guide team at [phone] if you need to cancel or reschedule your appointment.   If you are experiencing a Mental Health or [place] or need someone to talk to, please call the Suicide and Crisis Lifeline: 988   The patient verbalized understanding of instructions, educational materials, and care plan provided today and DECLINED offer to receive copy of patient instructions, educational materials, and care plan.   The patient has been provided with contact information for the care management team and has been advised to call with any health related questions or  concerns.   Jone Baseman, RN, MSN [place] Management Care Management Coordinator Direct Line [phone]

## 2023-01-01 ENCOUNTER — Ambulatory Visit: Payer: Self-pay | Admitting: *Deleted

## 2023-01-01 ENCOUNTER — Telehealth: Payer: Self-pay

## 2023-01-01 DIAGNOSIS — I872 Venous insufficiency (chronic) (peripheral): Secondary | ICD-10-CM | POA: Diagnosis not present

## 2023-01-01 DIAGNOSIS — R600 Localized edema: Secondary | ICD-10-CM | POA: Diagnosis not present

## 2023-01-01 DIAGNOSIS — Q5522 Retractile testis: Secondary | ICD-10-CM | POA: Diagnosis not present

## 2023-01-01 NOTE — Patient Instructions (Signed)
Visit Information  Thank you for taking time to visit with me today. Please don't hesitate to contact me if I can be of assistance to you.   Following are the goals we discussed today:   Goals Addressed             This Visit's Progress    Transportation       Care Coordination Interventions SCAT application completed and to be faxed in to [facility] today.  Pt updated and advised to expect call from [employer] to do the assessment for determination of eligibility.    Pt appreciative and denies needs at this time. CSW will follow up in 2 weeks.  Pt voiced no concerns for CSW assistance aside from transportation as he "take the bus" and does find it difficult at time to walk to the bus stop- Advised pt he may be eligible for the SCAT/[place] Access transportation service as well as [employer] benefit-  Pt is not sure of his Medicaid status- he did read to me over the phone what sounded like a Medicaid ID # but not certain Pt is independent with ADL's and has a friend and her kids who live with him- she works  Advised patient to expect phone call from [facility] about the SCAT application that is to be completed and signed by PCP and sent for determination. Pt reports he gets $281 in Food Stamps monthly and denies any SDOH concerns. Pt reports he joined the [employer] and plans to go back once it warms up some Pt reports he does not go out a lot to socialize, church, etc out of fear and not feeling safe          Our next appointment is by telephone on 01/17/23  Please call the care guide team at [phone] if you need to cancel or reschedule your appointment.   If you are experiencing a Mental Health or [place] or need someone to talk to, please call the Suicide and Crisis Lifeline: 988 call 911   The patient verbalized understanding of instructions, educational materials, and care plan provided today and DECLINED offer to receive copy of patient instructions, educational  materials, and care plan.   Telephone follow up appointment with care management team member scheduled for:01/17/23  Eduard Clos, MSW, [place] Worker [employer] [phone]

## 2023-01-01 NOTE — Patient Outreach (Signed)
  Care Coordination   Follow Up Visit Note   01/01/2023 Name: [name] MRN: [id number] DOB: 1961/02/11  [name] is a 62 y.o. year old male who sees [name], [place] for primary care. I spoke with  [name] by phone today.  What matters to the patients health and wellness today?  Transportation    Goals Addressed             This Visit's Progress    Transportation       Care Coordination Interventions SCAT application completed and to be faxed in to [facility] today.  Pt updated and advised to expect call from [employer] to do the assessment for determination of eligibility.    Pt appreciative and denies needs at this time. CSW will follow up in 2 weeks.  Pt voiced no concerns for CSW assistance aside from transportation as he "take the bus" and does find it difficult at time to walk to the bus stop- Advised pt he may be eligible for the SCAT/[place] Access transportation service as well as Hum[employer] benefit-  Pt is not sure of his Medicaid status- he did read to me over the phone what sounded like a Medicaid ID # but not certain Pt is independent with ADL's and has a friend and her kids who live with him- she works  Advised patient to expect phone call from [facility] about the SCAT application that is to be completed and signed by PCP and sent for determination. Pt reports he gets $281 in Food Stamps monthly and denies any SDOH concerns. Pt reports he joined the [employer] and plans to go back once it warms up some Pt reports he does not go out a lot to socialize, church, etc out of fear and not feeling safe          SDOH assessments and interventions completed:  Yes     Care Coordination Interventions:  Yes, provided   Follow up plan: Follow up call scheduled for 01/17/23    Encounter Outcome:  Pt. Visit Completed

## 2023-01-01 NOTE — Patient Outreach (Signed)
  Care Coordination   In Person Provider Office Visit Note   01/01/2023 Name: [name] MRN: [id number] DOB: 1961/06/15  [name] is a 62 y.o. year old male who sees [name], [place] for primary care. I engaged with [name] in the providers office today.  What matters to the patients health and wellness today?  Just wants to get medications at one pharmacy, be healthy and maintain his independence.     Goals Addressed             This Visit's Progress    Diabetes Management       Patient Goals/Self Care Activities: -check blood sugar at prescribed times -record values and write them down take them to all doctor visits   Collaborated with [facility] Pharmacist Dorian Pod about patient status.  Patient met with upstream pharmacist today in the practice.   Patient catches the bus.  He enjoys the walking.  However, patient has multiple comorbidites and could benefit from SCAT. He uses a cane for ambulation.  SCAT application faxed today by CM. Collaborated with PCP at practice as to patient status of THN involvement Interventions Today    Flowsheet Row Most Recent Value  Chronic Disease   Chronic disease during today's visit Diabetes  General Interventions   General Interventions Discussed/Reviewed General Interventions Reviewed  Education Interventions   Education Provided Provided Education  Provided Verbal Education On Blood Sugar Monitoring, Nutrition             SDOH assessments and interventions completed:  Yes     Care Coordination Interventions:  Yes, provided   Follow up plan: Follow up call scheduled for 01/14/23    Encounter Outcome:  Pt. Visit Completed   Jone Baseman, RN, MSN [place] [facility] Care Management Coordinator Direct Line [phone]

## 2023-01-01 NOTE — Patient Instructions (Signed)
Visit Information  Thank you for taking time to visit with me today. Please don't hesitate to contact me if I can be of assistance to you.   Following are the goals we discussed today:   Goals Addressed             This Visit's Progress    Diabetes Management       Patient Goals/Self Care Activities: -check blood sugar at prescribed times -record values and write them down take them to all doctor visits   Collaborated with [facility] Pharmacist Dorian Pod about patient status.  Patient met with upstream pharmacist today in the practice.   Patient catches the bus.  He enjoys the walking.  However, patient has multiple comorbidites and could benefit from SCAT. He uses a cane for ambulation.  SCAT application faxed today by CM. Collaborated with PCP at practice as to patient status of THN involvement Interventions Today    Flowsheet Row Most Recent Value  Chronic Disease   Chronic disease during today's visit Diabetes  General Interventions   General Interventions Discussed/Reviewed General Interventions Reviewed  Education Interventions   Education Provided Provided Education  Provided Verbal Education On Blood Sugar Monitoring, Nutrition             Our next appointment is by telephone on 01/14/23 at 1030  Please call the care guide team at [phone] if you need to cancel or reschedule your appointment.   If you are experiencing a Mental Health or [place] or need someone to talk to, please call the Suicide and Crisis Lifeline: 988   The patient verbalized understanding of instructions, educational materials, and care plan provided today and DECLINED offer to receive copy of patient instructions, educational materials, and care plan.   The patient has been provided with contact information for the care management team and has been advised to call with any health related questions or concerns.   Jone Baseman, RN, MSN [employer] Management Care  Management Coordinator Direct Line [phone]

## 2023-01-02 ENCOUNTER — Other Ambulatory Visit: Payer: Self-pay | Admitting: Family Medicine

## 2023-01-02 DIAGNOSIS — Q5522 Retractile testis: Secondary | ICD-10-CM

## 2023-01-09 ENCOUNTER — Telehealth: Payer: Self-pay | Admitting: *Deleted

## 2023-01-09 NOTE — Patient Outreach (Signed)
  Care Coordination   Follow Up Visit Note   01/09/2023 Name: CASS RILL MRN: [id number] DOB: Jul 12, 1961  IZYAN MAGSTADT is a 62 y.o. year old male who sees [name], [place] for primary care. I  collaborated with [employer] rep re: SCAT application status  What matters to the patients health and wellness today?  Unable to reach pt     Goals Addressed             This Visit's Progress    Transportation       Goals SCAT application faxed to [employer]- spoke with rep today and need to revise/edit form and resend. Attempted to update pt- left message for call back     CSW will follow up in 2 weeks.         SDOH assessments and interventions completed:  Yes     Care Coordination Interventions:  Yes, provided  Interventions Today    Flowsheet Row Most Recent Value  Chronic Disease   Chronic disease during today's visit Hypertension (HTN)  General Interventions   General Interventions Discussed/Reviewed [employer], Communication with  Communication with Social Work  [SCAT rep]  Location manager Other  [SCAT]  Education Interventions   Applications Other  [SCAT]       Follow up plan: Follow up call scheduled for 01/23/23    Encounter Outcome:  Pt. Visit Completed

## 2023-01-14 ENCOUNTER — Ambulatory Visit: Payer: Self-pay

## 2023-01-14 NOTE — Patient Outreach (Signed)
  Care Coordination   Follow Up Visit Note   01/14/2023 Name: AFOLABI BALTZER MRN: [id number] DOB: 1961/08/16  WILHELM HASSAN is a 62 y.o. year old male who sees [name], [place] for primary care. I spoke with  [name] by phone today.  What matters to the patients health and wellness today?  Managing diabetes    Goals Addressed             This Visit's Progress    Diabetes Management       Patient Goals/Self Care Activities: -check blood sugar at prescribed times -record values and write them down take them to all doctor visits   Patient met with [facility] pharmacist.  [facility] per patient. Patient catches the bus.  He enjoys the walking.  However, patient has multiple comorbidites and could benefit from SCAT. He uses a cane for ambulation.  Patient working on new SCAT application  Interventions Today    Flowsheet Row Most Recent Value  Chronic Disease   Chronic disease during today's visit Diabetes  General Interventions   General Interventions Discussed/Reviewed General Interventions Reviewed  Exercise Interventions   Exercise Discussed/Reviewed Exercise Reviewed  [recently joined the [employer]  Education Interventions   Education Provided Provided Education  Provided Verbal Education On Blood Sugar Monitoring, Nutrition  Nutrition Interventions   Nutrition Discussed/Reviewed Decreasing sugar intake, Nutrition Reviewed             SDOH assessments and interventions completed:  Yes     Care Coordination Interventions:  Yes, provided   Follow up plan: Follow up call scheduled for March    Encounter Outcome:  Pt. Visit Completed   Jone Baseman, RN, MSN [facility] Care Management Coordinator Direct Line [phone]

## 2023-01-14 NOTE — Patient Instructions (Signed)
Visit Information  Thank you for taking time to visit with me today. Please don't hesitate to contact me if I can be of assistance to you.   Following are the goals we discussed today:   Goals Addressed             This Visit's Progress    Diabetes Management       Patient Goals/Self Care Activities: -check blood sugar at prescribed times -record values and write them down take them to all doctor visits   Patient met with [facility] pharmacist.  [facility] patient. Patient catches the bus.  He enjoys the walking.  However, patient has multiple comorbidites and could benefit from SCAT. He uses a cane for ambulation.  Patient working on new SCAT application  Interventions Today    Flowsheet Row Most Recent Value  Chronic Disease   Chronic disease during today's visit Diabetes  General Interventions   General Interventions Discussed/Reviewed General Interventions Reviewed  Exercise Interventions   Exercise Discussed/Reviewed Exercise Reviewed  [recently joined the [employer]  Education Interventions   Education Provided Provided Education  Provided Verbal Education On Blood Sugar Monitoring, Nutrition  Nutrition Interventions   Nutrition Discussed/Reviewed Decreasing sugar intake, Nutrition Reviewed             Our next appointment is by telephone on 01/29/23 at 1000  Please call the care guide team at [phone] if you need to cancel or reschedule your appointment.   If you are experiencing a Mental Health or [place] or need someone to talk to, please call the Suicide and Crisis Lifeline: 988   The patient verbalized understanding of instructions, educational materials, and care plan provided today and agreed to receive a mailed copy of patient instructions, educational materials, and care plan.   The patient has been provided with contact information for the care management team and has been advised to call with  any health related questions or concerns.    Jone Baseman, RN, MSN [place] Management Care Management Coordinator Direct Line [phone]

## 2023-01-16 ENCOUNTER — Encounter: Payer: Self-pay | Admitting: *Deleted

## 2023-01-16 DIAGNOSIS — K219 Gastro-esophageal reflux disease without esophagitis: Secondary | ICD-10-CM | POA: Diagnosis not present

## 2023-01-16 DIAGNOSIS — E1140 Type 2 diabetes mellitus with diabetic neuropathy, unspecified: Secondary | ICD-10-CM | POA: Diagnosis not present

## 2023-01-16 DIAGNOSIS — G4700 Insomnia, unspecified: Secondary | ICD-10-CM | POA: Diagnosis not present

## 2023-01-16 DIAGNOSIS — E7800 Pure hypercholesterolemia, unspecified: Secondary | ICD-10-CM | POA: Diagnosis not present

## 2023-01-16 NOTE — Patient Outreach (Signed)
  Care Coordination      Visit Note   01/16/2023 Name: [name] MRN: [id number] DOB: Jun 04, 1961  [name] is a 62 y.o. year old male who sees [name], [place] for primary care. I was informed of an error and have now updated SCAT application and have resubmitted to [employer].  What matters to the patients health and wellness today?  Transportation     Goals Addressed   None     SDOH assessments and interventions completed:  Yes     Care Coordination Interventions:  Yes, provided    Follow up plan: Follow up call scheduled for 01/31/23    Encounter Outcome:  Pt. Visit Completed

## 2023-01-17 ENCOUNTER — Encounter: Payer: Medicare HMO | Admitting: *Deleted

## 2023-01-20 ENCOUNTER — Telehealth: Payer: Self-pay

## 2023-01-20 NOTE — Patient Instructions (Signed)
Visit Information  Thank you for taking time to visit with me today. Please don't hesitate to contact me if I can be of assistance to you.   Following are the goals we discussed today:   Goals Addressed             This Visit's Progress    Diabetes Management       Patient Goals/Self Care Activities: -check blood sugar at prescribed times -record values and write them down take them to all doctor visits   Patient reports having some eye puffiness and facial itching. Denies any chemical exposure.  Advised patient that he may need to go to the ED. He declined.   Patient reports a sore to back of his leg leg.  He states his sock had some bloody drainage this morning.  Advised patient that he may need to place a bandage on it.  He verbalized understanding.  He denies redness or swelling to site.  Appointment made to PCP for Wednesday at 1000 am. Patient states he will be there.   Interventions Today    Flowsheet Row Most Recent Value  Chronic Disease   Chronic disease during today's visit Diabetes  General Interventions   General Interventions Discussed/Reviewed General Interventions Reviewed  Communication with Social Work, PCP/Specialists  [appointment made with PCP]  Education Interventions   Education Provided Provided Education  Provided Verbal Education On Other  [Wound to back of left leg]             Our next appointment is by telephone on 01/29/23 at 1000  Please call the care guide team at [phone] if you need to cancel or reschedule your appointment.   If you are experiencing a Mental Health or [place] or need someone to talk to, please call the Suicide and Crisis Lifeline: 988   The patient verbalized understanding of instructions, educational materials, and care plan provided today and DECLINED offer to receive copy of patient instructions, educational materials, and care plan.   The patient has been provided with contact information for the  care management team and has been advised to call with any health related questions or concerns.   Jone Baseman, RN, MSN [place] Management Care Management Coordinator Direct Line [phone]

## 2023-01-20 NOTE — Patient Outreach (Signed)
  Care Coordination   Follow Up Visit Note   01/20/2023 Name: LADANIEL LEFEBURE MRN: [id number] DOB: 07-10-1961  BERNAL KOMM is a 62 y.o. year old male who sees [name], [place] for primary care. I spoke with  [name] by phone today.  What matters to the patients health and wellness today?  Swelling to eyes and facial itching. Patient declined going to the ER.     Goals Addressed             This Visit's Progress    Diabetes Management       Patient Goals/Self Care Activities: -check blood sugar at prescribed times -record values and write them down take them to all doctor visits   Patient reports having some eye puffiness and facial itching. Denies any chemical exposure.  Advised patient that he may need to go to the ED. He declined.   Patient reports a sore to back of his leg leg.  He states his sock had some bloody drainage this morning.  Advised patient that he may need to place a bandage on it.  He verbalized understanding.  He denies redness or swelling to site.  Appointment made to PCP for Wednesday at 1000 am. Patient states he will be there.   Interventions Today    Flowsheet Row Most Recent Value  Chronic Disease   Chronic disease during today's visit Diabetes  General Interventions   General Interventions Discussed/Reviewed General Interventions Reviewed  Communication with Social Work, PCP/Specialists  [appointment made with PCP]  Education Interventions   Education Provided Provided Education  Provided Verbal Education On Other  [Wound to back of left leg]             SDOH assessments and interventions completed:  Yes     Care Coordination Interventions:  Yes, provided   Follow up plan: Follow up call scheduled for March 13, 20204    Encounter Outcome:  Pt. Visit Completed   Jone Baseman, RN, MSN [place] Management Care Management Coordinator Direct Line [phone]

## 2023-01-22 ENCOUNTER — Ambulatory Visit: Payer: Self-pay | Admitting: *Deleted

## 2023-01-22 DIAGNOSIS — E1140 Type 2 diabetes mellitus with diabetic neuropathy, unspecified: Secondary | ICD-10-CM | POA: Diagnosis not present

## 2023-01-22 DIAGNOSIS — M79606 Pain in leg, unspecified: Secondary | ICD-10-CM | POA: Diagnosis not present

## 2023-01-22 DIAGNOSIS — R269 Unspecified abnormalities of gait and mobility: Secondary | ICD-10-CM | POA: Diagnosis not present

## 2023-01-22 DIAGNOSIS — F70 Mild intellectual disabilities: Secondary | ICD-10-CM | POA: Diagnosis not present

## 2023-01-22 DIAGNOSIS — M7989 Other specified soft tissue disorders: Secondary | ICD-10-CM | POA: Diagnosis not present

## 2023-01-22 DIAGNOSIS — L0390 Cellulitis, unspecified: Secondary | ICD-10-CM | POA: Diagnosis not present

## 2023-01-22 NOTE — Patient Outreach (Addendum)
  Care Coordination   Follow Up Visit Note   01/22/2023 Name: [name] MRN: [id number] DOB: January 03, 1961  [name] is a 62 y.o. year old male who sees [name], [place] for primary care. I engaged with [name] in the providers office today.  What matters to the patients health and wellness today?  Need blood pressure cuff, transportation,etc    Goals Addressed             This Visit's Progress    Transportation       Goals:  Nice to meet you in person today at Dr Theda Sers office.    I will call your insurance provider for more information about your Enhanced Benefits (is life alert covered?) Make sure to order from [place] before end of month for this quarterly credit to shop Your SCAT application is pending- I  faxed to [place]- await response from rep  Get back [place] the local Y and exercise as soon as your ready! I have ordered a Blood Pressure cuff to be mailed to your home- be on the lookout for this I spoke with Medicaid worker who is looking into possible full Medicaid for you- right now you only have "family Planning" partial Medicaid                   SDOH assessments and interventions completed:  Yes     Care Coordination Interventions:  Yes, provided   Interventions Today    Flowsheet Row Most Recent Value  Chronic Disease   Chronic disease during today's visit Hypertension (HTN)  General Interventions   General Interventions Discussed/Reviewed [employer], Communication with, Museum/gallery conservator (DME)  [Ordered BP cuff to be mailed to pt today.  Communicated with DSS/Medicaid workers/benefits for ?full Medicaid given income]  Durable Medical Equipment (DME) BP Cuff  [to be mailed to you asap]  Communication with PCP/Specialists  Applications Medicaid, Other  [SCAT- await response for update on application]  Exercise Interventions   Exercise Discussed/Reviewed Exercise Discussed, Exercise Reviewed, Physical Activity   Physical Activity Discussed/Reviewed Physical Activity Discussed, Physical Activity Reviewed, Types of exercise, Gym  [Member of local Y and plans to start back soon- has been doing home exerecised over the winter]  Education Interventions   Provided Verbal Education On Applications, [employer], Engineer, materials, Other  [SCAT- await response for update on application]  [place] Discussed  Nutrition Interventions   Nutrition Discussed/Reviewed Nutrition Discussed  [gets Food Stamps]  Advanced Directive Interventions   Advanced Directives Discussed/Reviewed Advanced Directives Discussed, Provided resource for acquiring and filling out documents     '  Follow up plan: Follow up call scheduled for 01/29/23    Encounter Outcome:  Pt. Visit Completed

## 2023-01-22 NOTE — Patient Instructions (Signed)
Visit Information  Thank you for taking time to visit with me today. Please don't hesitate to contact me if I can be of assistance to you.   Following are the goals we discussed today:   Goals Addressed             This Visit's Progress    Transportation       Goals:  Nice to meet you in person today at Dr Theda Sers office.    I will call your insurance provider for more information about your Enhanced Benefits (is life alert covered?) Make sure to order from [place] before end of month for this quarterly credit to shop Your SCAT application is pending- I  faxed to [place]- await response from rep  Get back to the local Y and exercise as soon as your ready! I have ordered a Blood Pressure cuff to be mailed to your home- be on the lookout for this I spoke with Medicaid worker who is looking into possible full Medicaid for you- right now you only have "family Planning" partial Medicaid                   Our next appointment is by telephone on 01/29/23  Please call the care guide team at [phone] if you need to cancel or reschedule your appointment.   If you are experiencing a Mental Health or [place] or need someone to talk to, please call the Suicide and Crisis Lifeline: 988 call 911   The patient verbalized understanding of instructions, educational materials, and care plan provided today and DECLINED offer to receive copy of patient instructions, educational materials, and care plan.   Telephone follow up appointment with care management team member scheduled for: 01/28/33  Eduard Clos, MSW, [place] Worker [employer] [phone]

## 2023-01-23 ENCOUNTER — Encounter: Payer: Self-pay | Admitting: *Deleted

## 2023-01-27 ENCOUNTER — Telehealth: Payer: Self-pay | Admitting: *Deleted

## 2023-01-27 NOTE — Patient Instructions (Signed)
Visit Information  Thank you for taking time to visit with me today. Please don't hesitate to contact me if I can be of assistance to you.   Following are the goals we discussed today:   Goals Addressed             This Visit's Progress    Transportation       Goals:       I spoke with SCAT rep and your application has been processed and a letter will be mailed to [facility]- check your mailbox for this and once received call me [phone] I will call your insurance provider for more information about your Enhanced Benefits (is life alert covered?) Make sure to order from [place] before end of month for this quarterly credit to shop I am so happy to hear you went back to the Y today to exercise- keep it up  ! I have ordered a Blood Pressure cuff to be mailed to your home-- be on the lookout for this I spoke with Medicaid worker who is looking into possible full Medicaid for you- right now you only have "family Planning" partial Medicaid                   Our next appointment is by telephone on 01/31/23  Please call the care guide team at [phone] if you need to cancel or reschedule your appointment.   If you are experiencing a Mental Health or [place] or need someone to talk to, please call the Suicide and Crisis Lifeline: 988 call 911   The patient verbalized understanding of instructions, educational materials, and care plan provided today and DECLINED offer to receive copy of patient instructions, educational materials, and care plan.   Telephone follow up appointment with care management team member scheduled for:01/31/23   Eduard Clos, MSW, [place] Worker [employer] [phone]

## 2023-01-27 NOTE — Patient Outreach (Signed)
  Care Coordination   Follow Up Visit Note   01/27/2023 Name: [name] MRN: [id number] DOB: [birth year]  [name] is a 62 y.o. year old male who sees [name], [place] for primary care. I spoke with  [name] by phone today.  What matters to the patients health and wellness today?  Transportation and other resources.    Goals Addressed             This Visit's Progress    Transportation       Goals:       I spoke with SCAT rep and your application has been processed and a letter will be mailed to [facility]- check your mailbox for this and once received call me [phone] I will call your insurance provider for more information about your Enhanced Benefits (is life alert covered?) Make sure to order from [place] before end of month for this quarterly credit to shop I am so happy to hear you went back to the Y today to exercise- keep it up  ! I have ordered a Blood Pressure cuff to be mailed to your home-- be on the lookout for this I spoke with Medicaid worker who is looking into possible full Medicaid for you- right now you only have "family Planning" partial Medicaid                   SDOH assessments and interventions completed:  Yes     Care Coordination Interventions:  Yes, provided  Interventions Today    Flowsheet Row Most Recent Value  General Interventions   General Interventions Discussed/Reviewed Communication with, Community Resources  [place]/SCAT re: application]       Follow up plan: Follow up call scheduled for 01/31/23    Encounter Outcome:  Pt. Visit Completed

## 2023-01-29 ENCOUNTER — Ambulatory Visit: Payer: Self-pay

## 2023-01-29 DIAGNOSIS — M7989 Other specified soft tissue disorders: Secondary | ICD-10-CM | POA: Diagnosis not present

## 2023-01-29 DIAGNOSIS — M79606 Pain in leg, unspecified: Secondary | ICD-10-CM | POA: Diagnosis not present

## 2023-01-29 DIAGNOSIS — Z9109 Other allergy status, other than to drugs and biological substances: Secondary | ICD-10-CM | POA: Diagnosis not present

## 2023-01-29 NOTE — Patient Outreach (Addendum)
  Care Coordination   In Person Provider Office Visit Note   01/29/2023 Name: [name] MRN: [id number] DOB: 1961/09/09  [name] is a 62 y.o. year old male who sees [name], [place] for primary care. I engaged with [name] in the providers office today.  What matters to the patients health and wellness today?  Left leg swelling and pain.     Goals Addressed             This Visit's Progress    Diabetes Management       Patient Goals/Self Care Activities: -check blood sugar at prescribed times -record values and write them down take them to all doctor visits   Patient reports having some eye puffiness and facial itching. Patient for follow up in clinic at [facility] for facial itching/swelling and left leg swelling and redness.  Interventions Today    Flowsheet Row Most Recent Value  Chronic Disease   Chronic disease during today's visit Diabetes, Hypertension (HTN)  General Interventions   General Interventions Discussed/Reviewed General Interventions Discussed  Communication with PCP/Specialists  Education Interventions   Education Provided Provided Education  [signs and symptoms of infection left lower leg. Patient being seen by PCP for follow up. Left leg red and swollen with small open area to the back of the leg. Patient on doxycycline antibiotic and referred to the wound center,]  Provided Verbal Education On Other  [left leg swelling]  Nutrition Interventions   Nutrition Discussed/Reviewed Fluid intake, Carbohydrate meal planning, Decreasing salt, Decreasing sugar intake             SDOH assessments and interventions completed:  Yes     Care Coordination Interventions:  Yes, provided   Follow up plan: Follow up call scheduled for 02/12/23    Encounter Outcome:  Pt. Visit Completed   Jone Baseman, RN, MSN [place] Management Care Management Coordinator Direct Line [phone]

## 2023-01-29 NOTE — Patient Instructions (Signed)
Visit Information  Thank you for taking time to visit with me today. Please don't hesitate to contact me if I can be of assistance to you.   Following are the goals we discussed today:   Goals Addressed             This Visit's Progress    Diabetes Management       Patient Goals/Self Care Activities: -check blood sugar at prescribed times -record values and write them down take them to all doctor visits   Patient reports having some eye puffiness and facial itching. Patient for follow up in clinic at [facility] for facial itching/swelling and left leg swelling and redness.  Interventions Today    Flowsheet Row Most Recent Value  Chronic Disease   Chronic disease during today's visit Diabetes, Hypertension (HTN)  General Interventions   General Interventions Discussed/Reviewed General Interventions Discussed  Communication with PCP/Specialists  Education Interventions   Education Provided Provided Education  [signs and symptoms of infection left lower leg. Patient being seen by PCP for follow up. Left leg red and swollen with small open area to the back of the leg.]  Provided Verbal Education On Other  [left leg swelling]  Nutrition Interventions   Nutrition Discussed/Reviewed Fluid intake, Carbohydrate meal planning, Decreasing salt, Decreasing sugar intake             Our next appointment is by telephone on 02/12/23 at 1030  Please call the care guide team at [phone] if you need to cancel or reschedule your appointment.   If you are experiencing a Mental Health or [place] or need someone to talk to, please call the Suicide and Crisis Lifeline: 988   The patient verbalized understanding of instructions, educational materials, and care plan provided today and DECLINED offer to receive copy of patient instructions, educational materials, and care plan.   The patient has been provided with contact information for the care management team and has  been advised to call with any health related questions or concerns.   Jone Baseman, RN, MSN [place] Management Care Management Coordinator Direct Line [phone]

## 2023-01-31 ENCOUNTER — Ambulatory Visit: Payer: Self-pay | Admitting: *Deleted

## 2023-01-31 NOTE — Patient Outreach (Signed)
  Care Coordination   Follow Up Visit Note   01/31/2023 Name: [name] MRN: [id number] DOB: [birth year]  [name] is a 62 y.o. year old male who sees [name], [place] for primary care. I spoke with  [name] by phone today.  What matters to the patients health and wellness today?  Transportation paperwork pending for hopeful approval for [employer] [place]AT   Goals Addressed   None     SDOH assessments and interventions completed:  Yes     Care Coordination Interventions:  Yes, provided  Interventions Today    Flowsheet Row Most Recent Value  Chronic Disease   Chronic disease during today's visit Diabetes  General Interventions   General Interventions Discussed/Reviewed [employer], Communication with  [Contacted SCAT rep- expect letter in mail for next steps]        Follow up plan: Follow up call scheduled for 02/05/23    Encounter Outcome:  Pt. Visit Completed

## 2023-01-31 NOTE — Patient Instructions (Signed)
Visit Information  Thank you for taking time to visit with me today. Please don't hesitate to contact me if I can be of assistance to you.   Following are the goals we discussed today:   Goals Addressed   None     Our next appointment is by telephone on 02/05/23  Please call the care guide team at [phone] if you need to cancel or reschedule your appointment.   If you are experiencing a Mental Health or [place] or need someone to talk to, please call the Suicide and Crisis Lifeline: 988 call 911   The patient verbalized understanding of instructions, educational materials, and care plan provided today and DECLINED offer to receive copy of patient instructions, educational materials, and care plan.   Telephone follow up appointment with care management team member scheduled for:02/05/23  Eduard Clos, MSW, [place] Worker [employer] [phone]

## 2023-02-05 ENCOUNTER — Telehealth: Payer: Self-pay | Admitting: *Deleted

## 2023-02-05 NOTE — Patient Outreach (Signed)
  Care Coordination   Follow Up Visit Note   02/05/2023 Name: [name] MRN: [id number] DOB: 09-29-1961  [name] is a 62 y.o. year old male who sees [name], [place] for primary care. I spoke with  [name] by phone today.  What matters to the patients health and wellness today?  "Heading the gym now"    Goals Addressed             This Visit's Progress    Transportation       Goals:       I spoke with SCAT rep and your application has been approved! Watch for a letter to be mailed to you- check your mailbox for this and once received call me [phone] I will call your insurance provider for more information about your Enhanced Benefits (is life alert covered?) Make sure to order from [place] before end of month for this quarterly credit to shop I am so happy to [place] [facility]  ! I have ordered a Blood Pressure cuff to be mailed to your home-- be on the lookout for this I spoke with Medicaid worker who is looking into possible full Medicaid for you- right now you only have "family Planning" partial Medicaid- await callback                   SDOH assessments and interventions completed:  Yes     Care Coordination Interventions:  Yes, provided  Interventions Today    Flowsheet Row Most Recent Value  Chronic Disease   Chronic disease during today's visit Diabetes  General Interventions   General Interventions Discussed/Reviewed [employer], Communication with  [employer] message for DSS Medicaid worker for update. Contacted SCAT and application was approved]  Applications Medicaid, Other  [SCAT]  Exercise Interventions   Exercise Discussed/Reviewed Exercise Discussed, Physical Activity  [[facility] for [facility]!]  Education Interventions   Applications Medicaid, Other  [SCAT]       Follow up plan: Follow up call scheduled for 02/19/23    Encounter Outcome:  Pt. Visit Completed

## 2023-02-05 NOTE — Patient Instructions (Signed)
Visit Information  Thank you for taking time to visit with me today. Please don't hesitate to contact me if I can be of assistance to you.   Following are the goals we discussed today:   Goals Addressed             This Visit's Progress    Transportation       Goals:       I spoke with SCAT rep and your application has been approved! Watch for a letter to be mailed to you- check your mailbox for this and once received call me [phone] I will call your insurance provider for more information about your Enhanced Benefits (is life alert covered?) Make sure to order from [place] before end of month for this quarterly credit to shop I am so happy to [place] [facility] [place] [facility]- keep [place] up  ! I have ordered a Blood Pressure cuff to be mailed to your home-- be on the lookout for this I spoke with Medicaid worker who is looking into possible full Medicaid for you- right now you only have "family Planning" partial Medicaid- await callback                   Our next appointment is by telephone on 02/20/23  Please call the care guide team at [phone] if you need to cancel or reschedule your appointment.   If you are experiencing a Mental Health or [place] or need someone to talk to, please call the Suicide and Crisis Lifeline: 988 call 911   The patient verbalized understanding of instructions, educational materials, and care plan provided today and DECLINED offer to receive copy of patient instructions, educational materials, and care plan.   Telephone follow up appointment with care management team member scheduled for:02/20/23  Eduard Clos, MSW, [place] Worker [employer] [phone]

## 2023-02-06 DIAGNOSIS — I10 Essential (primary) hypertension: Secondary | ICD-10-CM | POA: Diagnosis not present

## 2023-02-06 DIAGNOSIS — L0390 Cellulitis, unspecified: Secondary | ICD-10-CM | POA: Diagnosis not present

## 2023-02-06 DIAGNOSIS — I2510 Atherosclerotic heart disease of native coronary artery without angina pectoris: Secondary | ICD-10-CM | POA: Diagnosis not present

## 2023-02-06 DIAGNOSIS — E1140 Type 2 diabetes mellitus with diabetic neuropathy, unspecified: Secondary | ICD-10-CM | POA: Diagnosis not present

## 2023-02-06 DIAGNOSIS — I872 Venous insufficiency (chronic) (peripheral): Secondary | ICD-10-CM | POA: Diagnosis not present

## 2023-02-06 DIAGNOSIS — M79606 Pain in leg, unspecified: Secondary | ICD-10-CM | POA: Diagnosis not present

## 2023-02-06 DIAGNOSIS — E1151 Type 2 diabetes mellitus with diabetic peripheral angiopathy without gangrene: Secondary | ICD-10-CM | POA: Diagnosis not present

## 2023-02-06 DIAGNOSIS — M7989 Other specified soft tissue disorders: Secondary | ICD-10-CM | POA: Diagnosis not present

## 2023-02-06 DIAGNOSIS — F70 Mild intellectual disabilities: Secondary | ICD-10-CM | POA: Diagnosis not present

## 2023-02-08 ENCOUNTER — Inpatient Hospital Stay: Admission: RE | Admit: 2023-02-08 | Payer: Medicare HMO | Source: Ambulatory Visit

## 2023-02-12 ENCOUNTER — Ambulatory Visit: Payer: Self-pay

## 2023-02-12 NOTE — Patient Outreach (Signed)
  Care Coordination   Follow Up Visit Note   02/12/2023 Name: [name] MRN: [id number] DOB: 06-13-1961  [name] is a 62 y.o. year old male who sees [name], [place] for primary care. I spoke with  [name] by phone today.  What matters to the patients health and wellness today?  Staying healthy    Goals Addressed             This Visit's Progress    Diabetes Management       Patient Goals/Self Care Activities: -check blood sugar at prescribed times -record values and write them down take them to all doctor visits   Patient reports eye puffiness and facial itching is better. Patient reports leg swelling better and leg wound healed. Interventions Today    Flowsheet Row Most Recent Value  Chronic Disease   Chronic disease during today's visit Diabetes  General Interventions   General Interventions Discussed/Reviewed General Interventions Reviewed  Exercise Interventions   Exercise Discussed/Reviewed Exercise Reviewed  Physical Activity Discussed/Reviewed Gym  Education Interventions   Education Provided Provided Education  [Diabetes Management and diet]  Provided Verbal Education On Nutrition, Exercise, Blood Sugar Monitoring, When to see the doctor  Nutrition Interventions   Nutrition Discussed/Reviewed Nutrition Discussed, Fluid intake, Adding fruits and vegetables, Decreasing sugar intake, Portion sizes             SDOH assessments and interventions completed:  Yes     Care Coordination Interventions:  Yes, provided   Follow up plan: Follow up call scheduled for April    Encounter Outcome:  Pt. Visit Completed   Jone Baseman, RN, MSN [place] Management Care Management Coordinator Direct Line [phone]

## 2023-02-12 NOTE — Patient Instructions (Signed)
Visit Information  Thank you for taking time to visit with me today. Please don't hesitate to contact me if I can be of assistance to you.   Following are the goals we discussed today:   Goals Addressed             This Visit's Progress    Diabetes Management       Patient Goals/Self Care Activities: -check blood sugar at prescribed times -record values and write them down take them to all doctor visits   Patient reports eye puffiness and facial itching is better. Patient reports leg swelling better and leg wound healed. Interventions Today    Flowsheet Row Most Recent Value  Chronic Disease   Chronic disease during today's visit Diabetes  General Interventions   General Interventions Discussed/Reviewed General Interventions Reviewed  Exercise Interventions   Exercise Discussed/Reviewed Exercise Reviewed  Physical Activity Discussed/Reviewed Gym  Education Interventions   Education Provided Provided Education  [Diabetes Management and diet]  Provided Verbal Education On Nutrition, Exercise, Blood Sugar Monitoring, When to see the doctor  Nutrition Interventions   Nutrition Discussed/Reviewed Nutrition Discussed, Fluid intake, Adding fruits and vegetables, Decreasing sugar intake, Portion sizes             Our next appointment is by telephone on 02/26/23 at 1000  Please call the care guide team at [phone] if you need to cancel or reschedule your appointment.   If you are experiencing a Mental Health or [place] or need someone to talk to, please call the Suicide and Crisis Lifeline: 988   The patient verbalized understanding of instructions, educational materials, and care plan provided today and agreed to receive a mailed copy of patient instructions, educational materials, and care plan.   The patient has been provided with contact information for the care management team and has been advised to call with any health related questions or concerns.    Jone Baseman, RN, MSN [place] Management Care Management Coordinator Direct Line [phone]

## 2023-02-13 DIAGNOSIS — I872 Venous insufficiency (chronic) (peripheral): Secondary | ICD-10-CM | POA: Diagnosis not present

## 2023-02-13 DIAGNOSIS — E1140 Type 2 diabetes mellitus with diabetic neuropathy, unspecified: Secondary | ICD-10-CM | POA: Diagnosis not present

## 2023-02-13 DIAGNOSIS — I10 Essential (primary) hypertension: Secondary | ICD-10-CM | POA: Diagnosis not present

## 2023-02-13 DIAGNOSIS — M7989 Other specified soft tissue disorders: Secondary | ICD-10-CM | POA: Diagnosis not present

## 2023-02-16 DIAGNOSIS — E7800 Pure hypercholesterolemia, unspecified: Secondary | ICD-10-CM | POA: Diagnosis not present

## 2023-02-16 DIAGNOSIS — E1140 Type 2 diabetes mellitus with diabetic neuropathy, unspecified: Secondary | ICD-10-CM | POA: Diagnosis not present

## 2023-02-16 DIAGNOSIS — K219 Gastro-esophageal reflux disease without esophagitis: Secondary | ICD-10-CM | POA: Diagnosis not present

## 2023-02-16 DIAGNOSIS — G4700 Insomnia, unspecified: Secondary | ICD-10-CM | POA: Diagnosis not present

## 2023-02-18 DIAGNOSIS — I10 Essential (primary) hypertension: Secondary | ICD-10-CM | POA: Diagnosis not present

## 2023-02-18 DIAGNOSIS — M7989 Other specified soft tissue disorders: Secondary | ICD-10-CM | POA: Diagnosis not present

## 2023-02-18 DIAGNOSIS — F70 Mild intellectual disabilities: Secondary | ICD-10-CM | POA: Diagnosis not present

## 2023-02-18 DIAGNOSIS — E1140 Type 2 diabetes mellitus with diabetic neuropathy, unspecified: Secondary | ICD-10-CM | POA: Diagnosis not present

## 2023-02-18 DIAGNOSIS — I2510 Atherosclerotic heart disease of native coronary artery without angina pectoris: Secondary | ICD-10-CM | POA: Diagnosis not present

## 2023-02-18 DIAGNOSIS — E1151 Type 2 diabetes mellitus with diabetic peripheral angiopathy without gangrene: Secondary | ICD-10-CM | POA: Diagnosis not present

## 2023-02-18 DIAGNOSIS — M79606 Pain in leg, unspecified: Secondary | ICD-10-CM | POA: Diagnosis not present

## 2023-02-18 DIAGNOSIS — L0390 Cellulitis, unspecified: Secondary | ICD-10-CM | POA: Diagnosis not present

## 2023-02-18 DIAGNOSIS — I872 Venous insufficiency (chronic) (peripheral): Secondary | ICD-10-CM | POA: Diagnosis not present

## 2023-02-19 ENCOUNTER — Ambulatory Visit: Payer: Self-pay | Admitting: *Deleted

## 2023-02-19 NOTE — Patient Instructions (Signed)
Visit Information  Thank you for taking time to visit with me today. Please don't hesitate to contact me if I can be of assistance to you.   Following are the goals we discussed today:   Goals Addressed             This Visit's Progress    Transportation       Goals:       We have gotten your [facility]ound Center appointment moved up to 02/26/23 at 1:30- they will call you if a cancellation opens up something sooner Call the # on paperwork you received from [employer] to get scheduled for in -person assessent for SCAT rep and your application has been approved! Call your insurance provider for more information about your Enhanced Benefits (is life alert covered?) Make sure to order from [place] before end of month for this quarterly credit to shop I am so happy to [facility]- keep it up  ! Received Blood Pressure cuff CSW arranged to be mailed to your home-- be on the lookout for this I spoke with Medicaid worker who is looking into possible full Medicaid for you- right now you only have "family Planning" partial Medicaid- await callback                   Our next appointment is by telephone on 02/27/23  Please call the care guide team at [phone] if you need to cancel or reschedule your appointment.   If you are experiencing a Mental Health or [place] or need someone to talk to, please call 911   The patient verbalized understanding of instructions, educational materials, and care plan provided today and DECLINED offer to receive copy of patient instructions, educational materials, and care plan.   Telephone follow up appointment with care management team member scheduled for:02/27/23 Eduard Clos, MSW, [place] Worker [employer] [phone]

## 2023-02-19 NOTE — Patient Outreach (Signed)
  Care Coordination   Follow Up Visit Note   02/19/2023 Name: KAHLIF BERKMAN MRN: [id number] DOB: September 22, 1961  RONNY SICO is a 62 y.o. year old male who sees [name], [place] for primary care. I spoke with  [name] by phone today.  What matters to the patients health and wellness today?  Pt came to PCP office today  and was able to meet with CSW in person as well as be seen by [facility], CMA. Wound care and transportation are primary concerns today.    Goals Addressed             This Visit's Progress    Transportation       Goals:       We have gotten your Wound Center appointment moved up to 02/26/23 at 1:30- they will call you if a cancellation opens up something sooner Call the # on paperwork you received from [employer] to get scheduled for in -person assessent for [employer] rep and your application has been approved! Call your insurance provider for more information about your Enhanced Benefits (is life alert covered?) Make sure to order from [place] before end of month for this quarterly credit to shop I am so happy to hear [facility]- keep it up  ! Received Blood Pressure cuff CSW arranged to be mailed to your home-- be on the lookout for this I spoke with Medicaid worker who is looking into possible full Medicaid for you- right now you only have "family Planning" partial Medicaid- await callback                   SDOH assessments and interventions completed:  Yes     Care Coordination Interventions:  Yes, provided  Interventions Today    Flowsheet Row Most Recent Value  General Interventions   General Interventions Discussed/Reviewed Doctor Visits, Community Resources  [CSW called and has gotten your [place] visit pushed forward to next week [id number].]  Applications Other  [Call [employer] to schedule the in-person assessmnet for [employer]AT]  Education Interventions   Applications Other  [Call [employer] to schedule the in-person  assessmnet for SCAT]       Follow up plan: Follow up call scheduled for 02/27/23    Encounter Outcome:  Pt. Visit Completed

## 2023-02-21 ENCOUNTER — Encounter: Payer: Self-pay | Admitting: Podiatry

## 2023-02-21 ENCOUNTER — Ambulatory Visit (INDEPENDENT_AMBULATORY_CARE_PROVIDER_SITE_OTHER): Payer: Medicare HMO | Admitting: Podiatry

## 2023-02-21 DIAGNOSIS — M79675 Pain in left toe(s): Secondary | ICD-10-CM | POA: Diagnosis not present

## 2023-02-21 DIAGNOSIS — B351 Tinea unguium: Secondary | ICD-10-CM

## 2023-02-21 DIAGNOSIS — M79674 Pain in right toe(s): Secondary | ICD-10-CM

## 2023-02-21 DIAGNOSIS — E1142 Type 2 diabetes mellitus with diabetic polyneuropathy: Secondary | ICD-10-CM

## 2023-02-21 NOTE — Progress Notes (Signed)
This patient returns to my office for at risk foot care.  This patient requires this care by a professional since this patient will be at risk due to having diabetes  and kidney disease.  This patient is unable to cut nails himself since the patient cannot reach his nails.These nails are painful walking and wearing shoes.  This patient presents for at risk foot care today.  General Appearance  Alert, conversant and in no acute stress.  Vascular  Dorsalis pedis and posterior tibial  pulses are  not palpable  due to swelling. bilaterally.  Capillary return is within normal limits  bilaterally. Temperature is within normal limits  bilaterally.  Neurologic  Senn-Weinstein monofilament wire test within normal limits  bilaterally. Muscle power within normal limits bilaterally.  Nails Thick disfigured discolored nails with subungual debris  from hallux to fifth toes bilaterally. No evidence of bacterial infection or drainage bilaterally. Rams horn nails.  Orthopedic  No limitations of motion  feet .  No crepitus or effusions noted.  No bony pathology or digital deformities noted.  Skin  normotropic skin with no porokeratosis noted bilaterally.  No signs of infections or ulcers noted.     Onychomycosis  Pain in right toes  Pain in left toes  Consent was obtained for treatment procedures.   Mechanical debridement of nails 1-5  bilaterally performed with a nail nipper.  Filed with dremel without incident.    Return office visit      3 mos.               Told patient to return for periodic foot care and evaluation due to potential at risk complications.   Niamya Vittitow DPM   

## 2023-02-24 DIAGNOSIS — E1151 Type 2 diabetes mellitus with diabetic peripheral angiopathy without gangrene: Secondary | ICD-10-CM | POA: Diagnosis not present

## 2023-02-24 DIAGNOSIS — I2510 Atherosclerotic heart disease of native coronary artery without angina pectoris: Secondary | ICD-10-CM | POA: Diagnosis not present

## 2023-02-24 DIAGNOSIS — M7989 Other specified soft tissue disorders: Secondary | ICD-10-CM | POA: Diagnosis not present

## 2023-02-24 DIAGNOSIS — E1140 Type 2 diabetes mellitus with diabetic neuropathy, unspecified: Secondary | ICD-10-CM | POA: Diagnosis not present

## 2023-02-24 DIAGNOSIS — I872 Venous insufficiency (chronic) (peripheral): Secondary | ICD-10-CM | POA: Diagnosis not present

## 2023-02-24 DIAGNOSIS — I10 Essential (primary) hypertension: Secondary | ICD-10-CM | POA: Diagnosis not present

## 2023-02-24 DIAGNOSIS — L0390 Cellulitis, unspecified: Secondary | ICD-10-CM | POA: Diagnosis not present

## 2023-02-24 DIAGNOSIS — M79606 Pain in leg, unspecified: Secondary | ICD-10-CM | POA: Diagnosis not present

## 2023-02-24 DIAGNOSIS — F70 Mild intellectual disabilities: Secondary | ICD-10-CM | POA: Diagnosis not present

## 2023-02-26 ENCOUNTER — Encounter (HOSPITAL_BASED_OUTPATIENT_CLINIC_OR_DEPARTMENT_OTHER): Payer: Medicare HMO | Attending: General Surgery | Admitting: Physician Assistant

## 2023-02-26 ENCOUNTER — Ambulatory Visit: Payer: Self-pay

## 2023-02-26 DIAGNOSIS — L97822 Non-pressure chronic ulcer of other part of left lower leg with fat layer exposed: Secondary | ICD-10-CM | POA: Diagnosis not present

## 2023-02-26 DIAGNOSIS — E11622 Type 2 diabetes mellitus with other skin ulcer: Secondary | ICD-10-CM | POA: Diagnosis not present

## 2023-02-26 DIAGNOSIS — I890 Lymphedema, not elsewhere classified: Secondary | ICD-10-CM | POA: Diagnosis not present

## 2023-02-26 DIAGNOSIS — L97222 Non-pressure chronic ulcer of left calf with fat layer exposed: Secondary | ICD-10-CM | POA: Diagnosis not present

## 2023-02-26 DIAGNOSIS — L03116 Cellulitis of left lower limb: Secondary | ICD-10-CM | POA: Diagnosis not present

## 2023-02-26 NOTE — Progress Notes (Signed)
[name] [name], BROERING ([id number]) [id number] Page 1 of 7 Visit Report for 02/26/2023 Allergy List Details Patient Name: Date of Service: [name], [name] 02/26/2023 1:30 PM Medical Record Number: [id number] Patient Account Number: [id number] Date of Birth/Sex: Treating RN: 1961/08/01 (62 y.o. [name] [name], Lauren Primary Care [name] [name]: [name] [name] Other Clinician: Referring Gedalya [name]: Treating [name] [name]/Extender: [name] [name] Weeks in Treatment: 0 Allergies Active Allergies No Known Allergies Allergy Notes Electronic Signature([name]) Signed: 02/26/2023 3:30:40 PM By: [name] Mu RN Entered By: [name] [name] on 02/26/2023 09:37:20 -------------------------------------------------------------------------------- Arrival Information Details Patient Name: Date of Service: [name] [name] 02/26/2023 1:30 PM Medical Record Number: [id number] Patient Account Number: [id number] Date of Birth/Sex: Treating RN: 01/20/1961 (62 y.o. M) Primary Care [name] [name]: [name] [name] Other Clinician: Referring Deshayla Empson: Treating [name] [name]/Extender: [name] [name] in Treatment: 0 Visit Information Patient Arrived: [name] [name] Arrival Time: 13:56 Accompanied By: self Transfer Assistance: None Patient Identification Verified: Yes Secondary Verification Process Completed: Yes History Since Last Visit Added or deleted any medications: No Any new allergies or adverse reactions: No Had a fall or experienced change in activities of daily living that may affect risk of falls: No Signs or symptoms of abuse/neglect since last visito No Hospitalized since last visit: No Implantable device outside of the clinic excluding cellular tissue based products placed in the center since last visit: No Has Dressing in Place as Prescribed: Yes Pain Present Now: Yes Electronic Signature([name]) Signed: 02/26/2023 3:56:59 PM By: [name] [name] Entered By: [name] [name] on 02/26/2023 13:56:35 -------------------------------------------------------------------------------- Clinic Level of Care Assessment Details Patient Name: Date of Service: [name] [name], [name] [name] 02/26/2023 1:30 PM Medical Record Number: [id number] Patient Account Number: [id number] Date of Birth/Sex: Treating RN: 1961/01/20 (62 y.o. Tammy [name] Primary Care [name] [name]: [name] [name] Other Clinician: Referring [name]: Treating [name] [name]/Extender: [name] [name] in Treatment: 0 Clinic Level of Care Assessment Items TOOL 2 Quantity Score X- 1 0 Use when only an EandM is performed on the INITIAL visit COPELIN, [name] ([id number]) [phone].pdf Page 2 of 7 ASSESSMENTS - Nursing Assessment / Reassessment X- 1 20 General Physical Exam (combine w/ comprehensive assessment (listed just below) when performed on new pt. evals) X- 1 25 Comprehensive Assessment (HX, ROS, Risk Assessments, Wounds Hx, etc.) ASSESSMENTS - Wound and Skin A ssessment / Reassessment X - Simple Wound Assessment / Reassessment - one wound 1 5 []  - 0 Complex Wound Assessment / Reassessment - multiple wounds X- 1 10 Dermatologic / Skin Assessment (not related to wound area) ASSESSMENTS - Ostomy and/or Continence Assessment and Care []  - 0 Incontinence Assessment and Management []  - 0 Ostomy Care Assessment and Management (repouching, etc.) PROCESS - Coordination of Care X - Simple Patient / Family Education for ongoing care 1 15 []  - 0 Complex (extensive) Patient / Family Education for ongoing care X- 1 10 Staff obtains Chiropractor, Records, T Results / Process Orders est []  - 0 Staff telephones HHA, Nursing Homes / Clarify orders / etc []  - 0 Routine Transfer to another Facility (non-emergent condition) []  - 0 Routine Hospital Admission (non-emergent condition) []  - 0 New Admissions / Manufacturing engineer / Ordering NPWT Apligraf, etc. , []  -  0 Emergency Hospital Admission (emergent condition) X- 1 10 Simple Discharge Coordination []  - 0 Complex (extensive) Discharge Coordination PROCESS - Special Needs []  - 0 Pediatric / Minor Patient Management []  - 0 Isolation Patient Management []  - 0 Hearing / Language / Visual special needs []  - 0 Assessment of  Community assistance (transportation, D/C planning, etc.) []  - 0 Additional assistance / Altered mentation []  - 0 Support Surface([name]) Assessment (bed, cushion, seat, etc.) INTERVENTIONS - Wound Cleansing / Measurement X- 1 5 Wound Imaging (photographs - any number of wounds) []  - 0 Wound Tracing (instead of photographs) X- 1 5 Simple Wound Measurement - one wound []  - 0 Complex Wound Measurement - multiple wounds X- 1 5 Simple Wound Cleansing - one wound []  - 0 Complex Wound Cleansing - multiple wounds INTERVENTIONS - Wound Dressings []  - 0 Small Wound Dressing one or multiple wounds X- 1 15 Medium Wound Dressing one or multiple wounds []  - 0 Large Wound Dressing one or multiple wounds []  - 0 Application of Medications - injection INTERVENTIONS - Miscellaneous []  - 0 External ear exam []  - 0 Specimen Collection (cultures, biopsies, blood, body fluids, etc.) []  - 0 Specimen([name]) / Culture([name]) sent or taken to Lab for analysis []  - 0 Patient Transfer (multiple staff / Teddy Spike / Similar devices) [name], [name] [name] ([id number]) [id number] Page 3 of 7 []  - 0 Simple Staple / Suture removal (25 or less) []  - 0 Complex Staple / Suture removal (26 or more) []  - 0 Hypo / Hyperglycemic Management (close monitor of Blood Glucose) []  - 0 Ankle / Brachial Index (ABI) - do not check if billed separately Has the patient been seen at the hospital within the last three years: Yes Total Score: 125 Level Of Care: New/Established - Level 4 Electronic Signature([name]) Signed: 02/26/2023 4:16:30 PM By: [name] Stall RN, BSN Entered By: [name] [name]  on 02/26/2023 14:22:54 -------------------------------------------------------------------------------- Lower Extremity Assessment Details Patient Name: Date of Service: [name] [name], [name] [name] 02/26/2023 1:30 PM Medical Record Number: [id number] Patient Account Number: [id number] Date of Birth/Sex: Treating RN: 08-05-1961 (62 y.o. Tammy [name] Primary Care Arminta Gamm: [name] [name] Other Clinician: Referring Aubry Rankin: Treating Sujata Maines/Extender: [name] [name] Weeks in Treatment: 0 Edema Assessment Assessed: [Left: Yes] [Right: No] Edema: [Left: Ye] [Right: [name]] Calf Left: Right: Point of Measurement: 30 cm From Medial Instep 51 cm Ankle Left: Right: Point of Measurement: 10 cm From Medial Instep 23 cm Knee To Floor Left: Right: From Medial Instep 40 cm Vascular Assessment Pulses: Dorsalis Pedis Palpable: [Left:Yes] Notes Patient in too much pain to perform ABIs. Electronic Signature([name]) Signed: 02/26/2023 4:16:30 PM By: [name] Stall RN, BSN Entered By: [name] [name] on 02/26/2023 14:01:41 -------------------------------------------------------------------------------- Multi-Disciplinary Care Plan Details Patient Name: Date of Service: [name] [name], [name] [name] 02/26/2023 1:30 PM Medical Record Number: [id number] Patient Account Number: [id number] Date of Birth/Sex: Treating RN: July 13, 1961 (62 y.o. Tammy [name] Primary Care Rebekkah Powless: [name] [name] Other Clinician: Referring Veronnica Hennings: Treating Fremon Zacharia/Extender: Marcelline Mates, Carren Rang in Treatment: [address] KEELYN, FJELSTAD ([id number]) [id number] Page 4 of 7 Orientation to the Wound Care Program Nursing Diagnoses: Knowledge deficit related to the wound healing center program Goals: Patient/caregiver will verbalize understanding of the Wound Healing Center Program Date Initiated: 02/26/2023 Target Resolution Date: 03/28/2023 Goal Status: Active Interventions: Provide  education on orientation to the wound center Notes: Pain, Acute or Chronic Nursing Diagnoses: Pain, acute or chronic: actual or potential Potential alteration in comfort, pain Goals: Patient will verbalize adequate pain control and receive pain control interventions during procedures as needed Date Initiated: 02/26/2023 Target Resolution Date: 03/28/2023 Goal Status: Active Patient/caregiver will verbalize comfort level met Date Initiated: 02/26/2023 Target Resolution Date: 03/28/2023 Goal Status: Active Interventions: Encourage patient to take pain medications as prescribed Provide education on pain management  Reposition patient for comfort Treatment Activities: Administer pain control measures as ordered : 02/26/2023 Notes: Wound/Skin Impairment Nursing Diagnoses: Knowledge deficit related to ulceration/compromised skin integrity Goals: Patient/caregiver will verbalize understanding of skin care regimen Date Initiated: 02/26/2023 Target Resolution Date: 03/28/2023 Goal Status: Active Interventions: Assess patient/caregiver ability to perform ulcer/skin care regimen upon admission and as needed Assess ulceration([name]) every visit Provide education on ulcer and skin care Treatment Activities: Skin care regimen initiated : 02/26/2023 Topical wound management initiated : 02/26/2023 Notes: Electronic Signature([name]) Signed: 02/26/2023 4:16:30 PM By: [name] Stalleaton, Bobbi RN, BSN Entered By: [name] Stalleaton, Bobbi on 02/26/2023 14:14:41 -------------------------------------------------------------------------------- Pain Assessment Details Patient Name: Date of Service: [name] [name], [name] [name]. 02/26/2023 1:30 PM Medical Record Number: [id number] Patient Account Number: [id number] Date of Birth/Sex: Treating RN: 29-Oct-1961 (62 y.o. Tammy SoursM) Deaton, Bobbi Primary Care Annaliese Saez: [name] Reichmannollins, Dana Other Clinician: Referring Betzayda Braxton: Treating Careem Yasui/Extender: Marcelline MatesStone III, Hoyt Collins, Carren Rangana Weeks in Treatment: [address] Active  Problems [name] [name], [name] [name] ([id number]) [id number] Page 5 of 7 Location of Pain Severity and Description of Pain Patient Has Paino Yes Site Locations Pain Location: Pain in Ulcers Rate the pain. Current Pain Level: 8 Pain Management and Medication Current Pain Management: Medication: No Cold Application: No Rest: No Massage: No Activity: No T.E.N.[name].: No Heat Application: No Leg drop or elevation: No Is the Current Pain Management Adequate: Adequate How does your wound impact your activities of daily livingo Sleep: No Bathing: No Appetite: No Relationship With Others: No Bladder Continence: No Emotions: No Bowel Continence: No Work: No Toileting: No Drive: No Dressing: No Hobbies: No Psychologist, prison and probation serviceslectronic Signature([name]) Signed: 02/26/2023 4:16:30 PM By: [name] Stalleaton, Bobbi RN, BSN Entered By: [name] Stalleaton, Bobbi on 02/26/2023 13:59:02 -------------------------------------------------------------------------------- Patient/Caregiver Education Details Patient Name: Date of Service: [name] [name], [name] [name]. 4/10/2024andnbsp1:30 PM Medical Record Number: [id number] Patient Account Number: [id number] Date of Birth/Gender: Treating RN: 29-Oct-1961 (62 y.o. Tammy SoursM) Deaton, Bobbi Primary Care Physician: [name] Reichmannollins, Dana Other Clinician: Referring Physician: Treating Physician/Extender: [name] SlimStone III, Hoyt Collins, Dana Weeks in Treatment: 0 Education Assessment Education Provided To: Patient Education Topics Provided Welcome T [facility]-New Patient Packet: o Handouts: A Guide to Pain Control, Nutrition Methods: Explain/Verbal Responses: Reinforcements needed Electronic Signature([name]) Signed: 02/26/2023 4:16:30 PM By: [name] Stalleaton, Bobbi RN, BSN Entered By: [name] Stalleaton, Bobbi on 02/26/2023 14:14:55 [name] [name], [name] [name] ([id number]) [id number] Page 6 of 7 -------------------------------------------------------------------------------- Wound Assessment Details Patient  Name: Date of Service: [name] [name], [name] [name]. 02/26/2023 1:30 PM Medical Record Number: [id number] Patient Account Number: [id number] Date of Birth/Sex: Treating RN: 29-Oct-1961 (62 y.o. M) Primary Care Jahden Schara: [name] Reichmannollins, Dana Other Clinician: Referring Kenyonna Micek: Treating Kailin Leu/Extender: [name] SlimStone III, Hoyt Collins, Dana Weeks in Treatment: 0 Wound Status Wound Number: 3 Primary Etiology: Diabetic Wound/Ulcer of the Lower Extremity Wound Location: Left, Posterior Lower Leg Secondary Etiology: Lymphedema Wounding Event: Gradually Appeared Wound Status: Open Date Acquired: 01/18/2023 Comorbid History: Hypertension, Type II Diabetes, Gout Weeks Of Treatment: 0 Clustered Wound: No Wound Measurements Length: (cm) 5.5 Width: (cm) 3.5 Depth: (cm) 0.1 Area: (cm) 15.119 Volume: (cm) 1.512 % Reduction in Area: % Reduction in Volume: Epithelialization: Small (1-33%) Tunneling: No Undermining: No Wound Description Classification: Grade 1 Wound Margin: Epibole Exudate Amount: Medium Exudate Type: Serosanguineous Exudate Color: red, brown Foul Odor After Cleansing: No Slough/Fibrino Yes Wound Bed Necrotic Amount: Large (67-100%) Exposed Structure Necrotic Quality: Eschar, Adherent Slough Fascia Exposed: No Fat Layer (Subcutaneous Tissue) Exposed: Yes Tendon Exposed: No Muscle Exposed: No Joint Exposed: No Bone Exposed: No Periwound Skin Texture Texture Color No Abnormalities Noted: No No Abnormalities Noted: No Callus:  No Atrophie Blanche: No Crepitus: No Cyanosis: No Excoriation: No Ecchymosis: No Induration: No Erythema: Yes Rash: No Erythema Location: Circumferential Scarring: No Hemosiderin Staining: Yes Mottled: No Moisture Pallor: No No Abnormalities Noted: No Rubor: No Dry / Scaly: Yes Maceration: Yes Electronic Signature([name]) Signed: 02/26/2023 4:16:30 PM By: [name] Stall RN, BSN Entered By: [name] [name] on 02/26/2023  14:02:20 -------------------------------------------------------------------------------- Vitals Details Patient Name: Date of Service: [name] [name]. 02/26/2023 1:30 PM Medical Record Number: [id number] Patient Account Number: [id number] Date of Birth/Sex: Treating RN: [birth year] (62 y.o. M) Primary Care Eldon Zietlow: [name] [name] Other Clinician: Referring Meka Lewan: Treating Chenel Wernli/Extender: Marcelline Mates, Dana Weeks in Treatment: 0 Vital Signs [name] [name], [name] [name] ([id number]) [id number] Page 7 of 7 Time Taken: 13:54 Temperature (F): 98.1 Height (in): 65 Pulse (bpm): 86 Source: Stated Respiratory Rate (breaths/min): 18 Weight (lbs): 240 Blood Pressure (mmHg): 166/94 Source: Stated Capillary Blood Glucose (mg/dl): 903 Body Mass Index (BMI): 39.9 Reference Range: 80 - 120 mg / dl Electronic Signature([name]) Signed: 02/26/2023 3:56:59 PM By: [name] [name] Entered By: [name] [name] on 02/26/2023 13:55:57

## 2023-02-26 NOTE — Progress Notes (Signed)
[name], DOEPKE [name] ([id number]) [id number] Page 1 of 4 Visit Report for 02/26/2023 Abuse Risk Screen Details Patient Name: Date of Service: [name], [name] 02/26/2023 1:30 PM Medical Record Number: [id number] Patient Account Number: [id number] Date of Birth/Sex: Treating RN: 02/25/1961 (62 y.o. [name] Primary Care Rebekka Lobello: [name] Other Clinician: Referring Willies Laviolette: Treating Zaelynn Fuchs/Extender: Cleone Slim in Treatment: 0 Abuse Risk Screen Items Answer ABUSE RISK SCREEN: Has anyone close to you tried to hurt or harm you recentlyo No Do you feel uncomfortable with anyone in your familyo No Has anyone forced you do things that you didnt want to doo No Electronic Signature([name]) Signed: 02/26/2023 4:16:30 PM By: Shawn Stall RN, BSN Entered By: Shawn Stall on 02/26/2023 13:57:54 -------------------------------------------------------------------------------- Activities of Daily Living Details Patient Name: Date of Service: [name], [name] 02/26/2023 1:30 PM Medical Record Number: [id number] Patient Account Number: [id number] Date of Birth/Sex: Treating RN: [birth year] (62 y.o. [name] Primary Care Jamas Jaquay: [name] Other Clinician: Referring Harriet Sutphen: Treating Sidda Humm/Extender: Cleone Slim in Treatment: 0 Activities of Daily Living Items Answer Activities of Daily Living (Please select one for each item) Drive Automobile Not Able T Medications ake Completely Able Use T elephone Completely Able Care for Appearance Completely Able Use T oilet Completely Able Bath / Shower Completely Able Dress Self Completely Able Feed Self Completely Able Walk Completely Able Get In / Out Bed Completely Able Housework Completely Able Prepare Meals Completely Able Handle Money Completely Able Shop for Self Completely Able Electronic Signature([name]) Signed: 02/26/2023 4:16:30 PM By:  Shawn Stall RN, BSN Entered By: Shawn Stall on 02/26/2023 13:58:17 -------------------------------------------------------------------------------- Education Screening Details Patient Name: Date of Service: [name] 02/26/2023 1:30 PM Medical Record Number: [id number] Patient Account Number: [id number] Date of Birth/Sex: Treating RN: 1961/07/30 (62 y.o. [name] Primary Care Lyndzee Kliebert: [name] Other Clinician: Referring Salinda Snedeker: Treating Tab Rylee/Extender: Marcelline Mates, Carren Rang in Treatment: [address] [place] [name] ([id number]) [id number] Page 2 of 4 Primary Learner Assessed: Patient Learning Preferences/Education Level/Primary Language Learning Preference: Explanation, Demonstration, Printed Material Highest Education Level: High School Preferred Language: Economist Language Barrier: No Translator Needed: No Memory Deficit: No Emotional Barrier: No Cultural/Religious Beliefs Affecting Medical Care: No Physical Barrier Impaired Vision: No Impaired Hearing: No Decreased Hand dexterity: No Knowledge/Comprehension Knowledge Level: High Comprehension Level: High Ability to understand written instructions: High Ability to understand verbal instructions: High Motivation Anxiety Level: Calm Cooperation: Cooperative Education Importance: Acknowledges Need Interest in Health Problems: Asks Questions Perception: Coherent Willingness to Engage in Self-Management High Activities: Readiness to Engage in Self-Management High Activities: Electronic Signature([name]) Signed: 02/26/2023 4:16:30 PM By: Shawn Stall RN, BSN Entered By: Shawn Stall on 02/26/2023 13:58:34 -------------------------------------------------------------------------------- Fall Risk Assessment Details Patient Name: Date of Service: [name]. 02/26/2023 1:30 PM Medical Record Number: [id number] Patient Account Number:  [id number] Date of Birth/Sex: Treating RN: 1961/11/17 (62 y.o. [name], [name] Primary Care Tresea Heine: [name] Other Clinician: Referring Lorry Furber: Treating Abdulwahab Demelo/Extender: Cleone Slim in Treatment: 0 Fall Risk Assessment Items Have you had 2 or more falls in the last 12 monthso 0 No Have you had any fall that resulted in injury in the last 12 monthso 0 No FALLS RISK SCREEN History of falling - immediate or within 3 months 0 No Secondary diagnosis (Do you have 2 or more medical diagnoseso) 0 No Ambulatory aid None/bed rest/wheelchair/nurse 0 Yes Crutches/cane/walker 0 No Furniture 0 No Intravenous  therapy Access/Saline/Heparin Lock 0 No Gait/Transferring Normal/ bed rest/ wheelchair 0 Yes Weak (short steps with or without shuffle, stooped but able to lift head while walking, may seek 0 No support from furniture) Impaired (short steps with shuffle, may have difficulty arising from chair, head down, impaired 0 No balance) Mental Status Oriented to own ability 0 Yes Overestimates or forgets limitations 0 No Risk Level: Low Risk Score: 0 NEHEMIAH, SNARR [name] ([id number]) [phone] [id number].pdf Page 3 of 4 Electronic Signature([name]) -------------------------------------------------------------------------------- Foot Assessment Details Patient Name: Date of Service: [name], [name] 02/26/2023 1:30 PM Medical Record Number: [id number] Patient Account Number: [id number] Date of Birth/Sex: Treating RN: [birth year] (62 y.o. [name] Primary Care Monserat Prestigiacomo: [name] Other Clinician: Referring Secily Walthour: Treating Desha Bitner/Extender: Lolita Patella Weeks in Treatment: 0 Foot Assessment Items Site Locations + = Sensation present, - = Sensation absent, C = Callus, U = Ulcer R = Redness, W = Warmth, M = Maceration, PU = Pre-ulcerative lesion F = Fissure, [name] = Swelling, D = Dryness Assessment Right: Left: Other  Deformity: No No Prior Foot Ulcer: No No Prior Amputation: No No Charcot Joint: No No Ambulatory Status: Ambulatory Without Help Gait: Steady Electronic Signature([name]) Signed: 02/26/2023 4:16:30 PM By: Shawn Stall RN, BSN Entered By: Shawn Stall on 02/26/2023 14:03:46 -------------------------------------------------------------------------------- Nutrition Risk Screening Details Patient Name: Date of Service: SKEET, ROSENBOOM 02/26/2023 1:30 PM Medical Record Number: [id number] Patient Account Number: [id number] Date of Birth/Sex: Treating RN: [birth year] (62 y.o. [name] Primary Care Shown Dissinger: [name] Other Clinician: Referring Summit Arroyave: Treating Deva Ron/Extender: Lolita Patella Weeks in Treatment: 0 Height (in): 65 Weight (lbs): 240 Body Mass Index (BMI): 39.9 ANIEL, WINNS [name] ([id number]) [id number] Page 4 of 4 Nutrition Risk Screening Items Score Screening NUTRITION RISK SCREEN: I have an illness or condition that made me change the kind and/or amount of food I eat 2 Yes I eat fewer than two meals per day 0 No I eat few fruits and vegetables, or milk products 0 No I have three or more drinks of beer, liquor or wine almost every day 0 No I have tooth or mouth problems that make it hard for me to eat 0 No I don't always have enough money to buy the food I need 0 No I eat alone most of the time 0 No I take three or more different prescribed or over-the-counter drugs a day 1 Yes Without wanting to, I have lost or gained 10 pounds in the last six months 0 No I am not always physically able to shop, cook and/or feed myself 0 No Nutrition Protocols Good Risk Protocol Provide education on elevated blood Moderate Risk Protocol 0 sugars and impact on wound healing, as applicable High Risk Proctocol Risk Level: Moderate Risk Score: 3 Electronic Signature([name]) Signed: 02/26/2023 4:16:30 PM By: Shawn Stall RN,  BSN Entered By: Shawn Stall on 02/26/2023 14:00:39

## 2023-02-26 NOTE — Patient Instructions (Signed)
Visit Information  Thank you for taking time to visit with me today. Please don't hesitate to contact me if I can be of assistance to you.   Following are the goals we discussed today:   Goals Addressed             This Visit's Progress    Diabetes Management       Patient Goals/Self Care Activities: -check blood sugar at prescribed times -record values and write them down take them to all doctor visits   Patient being seen by the wound center today about legs. Patient reports some pain to legs and using ibuprofen.  Patient blood sugar last check up at 218.  Reiterated importance of diabetes management and what type of foods to eat. He verbalized understanding. Interventions Today    Flowsheet Row Most Recent Value  Chronic Disease   Chronic disease during today's visit Diabetes, Other  [wound center appointment today]  General Interventions   General Interventions Discussed/Reviewed General Interventions Reviewed, Doctor Visits  Doctor Visits Discussed/Reviewed Doctor Visits Discussed  Exercise Interventions   Exercise Discussed/Reviewed Exercise Reviewed  Physical Activity Discussed/Reviewed Gym  Education Interventions   Education Provided Provided Education  Provided Verbal Education On Blood Sugar Monitoring, Nutrition, Other  [Home Health visits]  Nutrition Interventions   Nutrition Discussed/Reviewed Nutrition Reviewed, Fluid intake, Adding fruits and vegetables, Decreasing sugar intake, Portion sizes             Our next appointment is by telephone on 03/13/25 at 1000 am  Please call the care guide team at [phone] if you need to cancel or reschedule your appointment.   If you are experiencing a Mental Health or Behavioral Health Crisis or need someone to talk to, please call the Suicide and Crisis Lifeline: 988   The patient verbalized understanding of instructions, educational materials, and care plan provided today and agreed to receive a mailed copy of  patient instructions, educational materials, and care plan.   The patient has been provided with contact information for the care management team and has been advised to call with any health related questions or concerns.   Bary Leriche, RN, MSN [facility] Care Management Care Management Coordinator Direct Line [phone]

## 2023-02-26 NOTE — Patient Outreach (Signed)
  Care Coordination   Follow Up Visit Note   02/26/2023 Name: [name] MRN: [id number] DOB: 1961/08/01  [name] is a 62 y.o. year old male who sees [name], [place] for primary care. I spoke with  [name] by phone today.  What matters to the patients health and wellness today?  Help his legs feel better. Wound center visit this afternoon.   Goals Addressed             This Visit's Progress    Diabetes Management       Patient Goals/Self Care Activities: -check blood sugar at prescribed times -record values and write them down take them to all doctor visits   Patient being seen by the wound center today about legs. Patient reports some pain to legs and using ibuprofen.  Patient blood sugar last check up at 218.  Reiterated importance of diabetes management and what type of foods to eat. He verbalized understanding. Interventions Today    Flowsheet Row Most Recent Value  Chronic Disease   Chronic disease during today's visit Diabetes, Other  [wound center appointment today]  General Interventions   General Interventions Discussed/Reviewed General Interventions Reviewed, Doctor Visits  Doctor Visits Discussed/Reviewed Doctor Visits Discussed  Exercise Interventions   Exercise Discussed/Reviewed Exercise Reviewed  Physical Activity Discussed/Reviewed Gym  Education Interventions   Education Provided Provided Education  Provided Verbal Education On Blood Sugar Monitoring, Nutrition, Other  [Home Health visits]  Nutrition Interventions   Nutrition Discussed/Reviewed Nutrition Reviewed, Fluid intake, Adding fruits and vegetables, Decreasing sugar intake, Portion sizes             SDOH assessments and interventions completed:  Yes     Care Coordination Interventions:  Yes, provided   Follow up plan: Follow up call scheduled for 4/26    Encounter Outcome:  Pt. Visit Completed   Bary Leriche, RN, MSN [facility] Care Management Care Management  Coordinator Direct Line [phone]

## 2023-02-27 NOTE — Progress Notes (Signed)
ABNER, SPERANDIO ([id number]) [id number] Page 1 of 7 Visit Report for 02/26/2023 Chief Complaint Document Details Patient Name: Date of Service: [name], [name] 02/26/2023 1:30 PM Medical Record Number: [id number] Patient Account Number: [id number] Date of Birth/Sex: Treating RN: 20-Feb-1961 (62 y.o. M) Primary Care Provider: [name] Other Clinician: Referring Provider: Treating Provider/Extender: Cleone Slim in Treatment: 0 Information Obtained from: Patient Chief Complaint Left LE Ulcers with Bilateral Lymphedema Electronic Signature([name]) Signed: 02/26/2023 2:09:02 PM By: Allen Derry PA-C Entered By: Allen Derry on 02/26/2023 14:09:02 -------------------------------------------------------------------------------- HPI Details Patient Name: Date of Service: [name]. 02/26/2023 1:30 PM Medical Record Number: [id number] Patient Account Number: [id number] Date of Birth/Sex: Treating RN: 1961-04-25 (62 y.o. M) Primary Care Provider: [name] Other Clinician: Referring Provider: Treating Provider/Extender: Cleone Slim in Treatment: 0 History of Present Illness HPI Description: 02/20/18; is a 62 year old man who is a poorly controlled type II diabetic with most recent hemoglobin A1c of 12.9 in January of this year. 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 He has not worn compression stockings. He is a nonsmoker He tells [place] that he has a history of wounds on his bilateral lower legs. It is really unclear when this started although he thinks they were there at Christmas time 2018 therefore they are not new. He was seen in his primary doctor'[name] office on 02/05/18 noted to have skin breakdown and erythema. This was especially in the left leg he was given doxycycline and Lasix. He was seen in their clinic again yesterday. Felt to have left leg  cellulitis. He was felt to possibly need debridement and he was referred here urgently. He is still on doxycycline. ABIs in this clinic were 0.89 on the right 0.97 on the left. Besides type 2 diabetes poorly controlled he has a history of proven gout, hyperlipidemia, hypertension and a history of bilateral cellulitis. He uses a walker to ambulate. 02/27/18; tolerated compression well. A lot of the areas have healed over. Still small open areas mostly on the right lateral and left posterior. I've elected to continue to dress this for another week. I would like him to come in next week with 20-30 mm below-knee stockings and we are setting him about elastic therapy to get these. I also emphasized skin lubrication. 03/06/18; everything is closed over here. He can be discharged in 20-30 mm below-knee stockings. He will need to lubricate the skin which is already cracked and fissuring. I've explained to him it is possible he will need more compression than this. We'll have to see how this goes over some time Readmission: 02-26-2023 upon evaluation today patient appears to be doing well currently all things considered in general other than the fact that he does appear to have some infection in regard to his lower extremity on the left leg. He does have evidence of bilateral lower extremity lymphedema but in particular I think that he has cellulitis at this site. Fortunately I do not see any evidence of active infection locally nor systemically which is great news. No fevers, chills, nausea, vomiting, or diarrhea. Upon inspection patient'[name] past medical history really has not changed significantly since we last saw him. He really does not have any major medical problems other than the lymphedema and diabetes. Unfortunately I think he just became cellulitic secondary to a crack in the back of the lymphedematous tissue on the left  lower extremity that is what is causing the severe pain for him. He has had his  Lasix increased by his primary care provider to 40 mg twice a day. He also currently has a negative DVT by Doppler confirmed which was stated in the primary care note as well which was reviewed today. He is on Eliquis. Electronic Signature([name]) Signed: 02/26/2023 2:31:33 PM By: Allen Derry PA-C Entered By: Allen Derry on 02/26/2023 14:31:33 [name] ([id number]) [id number] Page 2 of 7 -------------------------------------------------------------------------------- Physical Exam Details Patient Name: Date of Service: [name], [name] 02/26/2023 1:30 PM Medical Record Number: [id number] Patient Account Number: [id number] Date of Birth/Sex: Treating RN: 1961/03/02 (62 y.o. M) Primary Care Provider: [name] Other Clinician: Referring Provider: Treating Provider/Extender: Cleone Slim in Treatment: 0 Constitutional patient is hypertensive.. pulse regular and within target range for patient.Marland Kitchen respirations regular, non-labored and within target range for patient.Marland Kitchen temperature within target range for patient.. Obese and well-hydrated in no acute distress. Eyes conjunctiva clear no eyelid edema noted. pupils equal round and reactive to light and accommodation. Ears, Nose, Mouth, and Throat no gross abnormality of ear auricles or external auditory canals. normal hearing noted during conversation. mucus membranes moist. Respiratory normal breathing without difficulty. Cardiovascular 2+ dorsalis pedis/posterior tibialis pulses. 2+ pitting edema of the bilateral lower extremities. Musculoskeletal normal gait and posture. no significant deformity or arthritic changes, no loss or range of motion, no clubbing. Psychiatric this patient is able to make decisions and demonstrates good insight into disease process. Alert and Oriented x 3. pleasant and cooperative. Notes Upon inspection patient'[name] wound bed actually showed signs of good  granulation epithelization at this point. Fortunately I do not see any evidence systemic infection but at the same time I do think locally he does have cellulitis of the posterior aspect especially left lower extremity though this does wrap around to the front. There is erythema and warmth to this region unfortunately. He is extremely tender to touch and I think this is representative of the pain that he is in. I definitely believe that he is having the discomfort that he states he is. Electronic Signature([name]) Signed: 02/26/2023 2:33:17 PM By: Allen Derry PA-C Entered By: Allen Derry on 02/26/2023 14:33:16 -------------------------------------------------------------------------------- Physician Orders Details Patient Name: Date of Service: [name], [name] 02/26/2023 1:30 PM Medical Record Number: [id number] Patient Account Number: [id number] Date of Birth/Sex: Treating RN: Dec 29, 1960 (62 y.o. [name], [name] Primary Care Provider: [name] Other Clinician: Referring Provider: Treating Provider/Extender: Cleone Slim in Treatment: 0 Verbal / Phone Orders: No Diagnosis Coding ICD-10 Coding Code Description E11.622 Type 2 diabetes mellitus with other skin ulcer I89.0 Lymphedema, not elsewhere classified L97.822 Non-pressure chronic ulcer of other part of left lower leg with fat layer exposed L03.116 Cellulitis of left lower limb Follow-up Appointments ppointment in 1 week. Leonard Schwartz Wednesday 1100 03/05/2023 Room 8 Return A Other: - Go to your pharmacy to pick up oral antibiotics bactrim DS for celluitis. Start taking as soon as possible today. Bathing/ Shower/ Hygiene May shower and wash wound with soap and water. Edema Control - Lymphedema / SCD / Other Elevate legs to the level of the heart or above for 30 minutes daily and/or when sitting for 3-4 times a day throughout the day. Avoid standing for long periods of time. Exercise regularly BRAEDON, SJOGREN  ([id number]) [id number] Page 3 of 7 Wound Treatment Wound #3 - Lower Leg Wound Laterality: Left, Posterior Cleanser: Soap and Water  Every Other Day/30 Days Discharge Instructions: May shower and wash wound with dial antibacterial soap and water prior to dressing change. Secondary Dressing: ABD Pad, 8x10 Every Other Day/30 Days Discharge Instructions: Apply over primary dressing as directed. Secured With: 54M Medipore H Soft Cloth Surgical T ape, 4 x 10 (in/yd) Every Other Day/30 Days Discharge Instructions: Secure with tape as directed. Secured With: stocknette Every Other Day/30 Days Discharge Instructions: cover over the abd pad. Patient Medications llergies: No Known Allergies A Notifications Medication Indication Start End 02/26/2023 Bactrim DS DOSE 1 - oral 800 mg-160 mg tablet - 1 tablet oral twice a day x 14 days Electronic Signature([name]) Signed: 02/26/2023 2:23:43 PM By: Allen Derry PA-C Entered By: Allen Derry on 02/26/2023 14:23:43 -------------------------------------------------------------------------------- Problem List Details Patient Name: Date of Service: [name]. 02/26/2023 1:30 PM Medical Record Number: [id number] Patient Account Number: [id number] Date of Birth/Sex: Treating RN: [birth year] (62 y.o. M) Primary Care Provider: [name] Other Clinician: Referring Provider: Treating Provider/Extender: Cleone Slim in Treatment: 0 Active Problems ICD-10 Encounter Code Description Active Date MDM Diagnosis E11.622 Type 2 diabetes mellitus with other skin ulcer 02/26/2023 No Yes I89.0 Lymphedema, not elsewhere classified 02/26/2023 No Yes L97.822 Non-pressure chronic ulcer of other part of left lower leg with fat layer exposed4/08/2023 No Yes L03.116 Cellulitis of left lower limb 02/26/2023 No Yes Inactive Problems Resolved Problems Electronic Signature([name]) Signed: 02/26/2023 2:08:33 PM By: Allen Derry  PA-C Entered By: Allen Derry on 02/26/2023 14:08:33 [name] ([id number]) [id number] Page 4 of 7 -------------------------------------------------------------------------------- Progress Note Details Patient Name: Date of Service: [name], [name] 02/26/2023 1:30 PM Medical Record Number: [id number] Patient Account Number: [id number] Date of Birth/Sex: Treating RN: 1961/04/17 (62 y.o. M) Primary Care Provider: [name] Other Clinician: Referring Provider: Treating Provider/Extender: Cleone Slim in Treatment: 0 Subjective Chief Complaint Information obtained from Patient Left LE Ulcers with Bilateral Lymphedema History of Present Illness (HPI) 02/20/18; is a 62 year old man who is a poorly controlled type II diabetic with most recent hemoglobin A1c of 12.9 in January of this year. 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 He has not worn compression stockings. He is a nonsmoker He tells [place] that he has a history of wounds on his bilateral lower legs. It is really unclear when this started although he thinks they were there at Christmas time 2018 therefore they are not new. He was seen in his primary doctor'[name] office on 02/05/18 noted to have skin breakdown and erythema. This was especially in the left leg he was given doxycycline and Lasix. He was seen in their clinic again yesterday. Felt to have left leg cellulitis. He was felt to possibly need debridement and he was referred here urgently. He is still on doxycycline. ABIs in this clinic were 0.89 on the right 0.97 on the left. Besides type 2 diabetes poorly controlled he has a history of proven gout, hyperlipidemia, hypertension and a history of bilateral cellulitis. He uses a walker to ambulate. 02/27/18; tolerated compression well. A lot of the areas have healed over. Still small open areas mostly on the right  lateral and left posterior. I've elected to continue to dress this for another week. I would like him to come in next week with 20-30 mm below-knee stockings and we are setting him about elastic therapy to get these. I also emphasized skin lubrication. 03/06/18; everything is  closed over here. He can be discharged in 20-30 mm below-knee stockings. He will need to lubricate the skin which is already cracked and fissuring. I've explained to him it is possible he will need more compression than this. We'll have to see how this goes over some time Readmission: 02-26-2023 upon evaluation today patient appears to be doing well currently all things considered in general other than the fact that he does appear to have some infection in regard to his lower extremity on the left leg. He does have evidence of bilateral lower extremity lymphedema but in particular I think that he has cellulitis at this site. Fortunately I do not see any evidence of active infection locally nor systemically which is great news. No fevers, chills, nausea, vomiting, or diarrhea. Upon inspection patient'[name] past medical history really has not changed significantly since we last saw him. He really does not have any major medical problems other than the lymphedema and diabetes. Unfortunately I think he just became cellulitic secondary to a crack in the back of the lymphedematous tissue on the left lower extremity that is what is causing the severe pain for him. He has had his Lasix increased by his primary care provider to 40 mg twice a day. He also currently has a negative DVT by Doppler confirmed which was stated in the primary care note as well which was reviewed today. He is on Eliquis. Patient History Information obtained from Patient, Chart. Allergies No Known Allergies Family History Diabetes - Mother, Hypertension - Siblings, No family history of Cancer, Heart Disease, Hereditary Spherocytosis, Kidney Disease, Lung Disease,  Seizures, Stroke, Thyroid Problems, Tuberculosis. Social History Never smoker, Marital Status - Single, Alcohol Use - Never, Drug Use - No History, Caffeine Use - Rarely. Medical History Cardiovascular Patient has history of Hypertension Endocrine Patient has history of Type II Diabetes Musculoskeletal Patient has history of Gout Psychiatric Denies history of Anorexia/bulimia, Confinement Anxiety Hospitalization/Surgery History - Knee arthroscopy 2017. Medical A Surgical History Notes nd Cardiovascular hyperlipidemia [name], [name] ([id number]) [id number] Page 5 of 7 Objective Constitutional patient is hypertensive.. pulse regular and within target range for patient.Marland Kitchen respirations regular, non-labored and within target range for patient.Marland Kitchen temperature within target range for patient.. Obese and well-hydrated in no acute distress. Vitals Time Taken: 1:54 PM, Height: 65 in, Source: Stated, Weight: 240 lbs, Source: Stated, BMI: 39.9, Temperature: 98.1 F, Pulse: 86 bpm, Respiratory Rate: 18 breaths/min, Blood Pressure: 166/94 mmHg, Capillary Blood Glucose: 128 mg/dl. Eyes conjunctiva clear no eyelid edema noted. pupils equal round and reactive to light and accommodation. Ears, Nose, Mouth, and Throat no gross abnormality of ear auricles or external auditory canals. normal hearing noted during conversation. mucus membranes moist. Respiratory normal breathing without difficulty. Cardiovascular 2+ dorsalis pedis/posterior tibialis pulses. 2+ pitting edema of the bilateral lower extremities. Musculoskeletal normal gait and posture. no significant deformity or arthritic changes, no loss or range of motion, no clubbing. Psychiatric this patient is able to make decisions and demonstrates good insight into disease process. Alert and Oriented x 3. pleasant and cooperative. General Notes: Upon inspection patient'[name] wound bed actually showed signs of good granulation  epithelization at this point. Fortunately I do not see any evidence systemic infection but at the same time I do think locally he does have cellulitis of the posterior aspect especially left lower extremity though this does wrap around to the front. There is erythema and warmth to this region unfortunately. He is extremely tender to touch and I think this is  representative of the pain that he is in. I definitely believe that he is having the discomfort that he states he is. Integumentary (Hair, Skin) Wound #3 status is Open. Original cause of wound was Gradually Appeared. The date acquired was: 01/18/2023. The wound is located on the Left,Posterior Lower Leg. The wound measures 5.5cm length x 3.5cm width x 0.1cm depth; 15.119cm^2 area and 1.512cm^3 volume. There is Fat Layer (Subcutaneous Tissue) exposed. There is no tunneling or undermining noted. There is a medium amount of serosanguineous drainage noted. The wound margin is epibole. There is a large (67-100%) amount of necrotic tissue within the wound bed including Eschar and Adherent Slough. The periwound skin appearance exhibited: Dry/Scaly, Maceration, Hemosiderin Staining, Erythema. The periwound skin appearance did not exhibit: Callus, Crepitus, Excoriation, Induration, Rash, Scarring, Atrophie Blanche, Cyanosis, Ecchymosis, Mottled, Pallor, Rubor. The surrounding wound skin color is noted with erythema which is circumferential. Assessment Active Problems ICD-10 Type 2 diabetes mellitus with other skin ulcer Lymphedema, not elsewhere classified Non-pressure chronic ulcer of other part of left lower leg with fat layer exposed Cellulitis of left lower limb Plan Follow-up Appointments: Return Appointment in 1 week. [facility]- [facility] Wednesday 1100 03/05/2023 Room 8 Other: - Go to your pharmacy to pick up oral antibiotics bactrim DS for celluitis. Start taking as soon as possible today. Bathing/ Shower/ Hygiene: May shower and wash wound with soap and  water. Edema Control - Lymphedema / SCD / Other: Elevate legs to the level of the heart or above for 30 minutes daily and/or when sitting for 3-4 times a day throughout the day. Avoid standing for long periods of time. Exercise regularly The following medication([name]) was prescribed: Bactrim DS oral 800 mg-160 mg tablet 1 1 tablet oral twice a day x 14 days starting 02/26/2023 WOUND #3: - Lower Leg Wound Laterality: Left, Posterior Cleanser: Soap and Water Every Other Day/30 Days Discharge Instructions: May shower and wash wound with dial antibacterial soap and water prior to dressing change. Secondary Dressing: ABD Pad, 8x10 Every Other Day/30 Days Discharge Instructions: Apply over primary dressing as directed. Secured With: 33M Medipore H Soft Cloth Surgical T ape, 4 x 10 (in/yd) Every Other Day/30 Days Discharge Instructions: Secure with tape as directed. Secured With: stocknette Every Other Day/30 Days Discharge Instructions: cover over the abd pad. 1. I would recommend currently based on what we are seeing that we go ahead and recommend a continuation of therapy with the elevation and use of his diuretic which I think is appropriate. [name], [name] ([id number]) [id number] Page 6 of 7 2. I am also going to see about getting started on an antibiotic as soon as possible. I am sending a prescription for Bactrim DS I think this will be a good option for him and my hope is that he will see a dramatic improvement with the use of this medication. He is in agreement with that plan. We will see patient back for reevaluation in 1 week here in the clinic. If anything worsens or changes patient will contact our office for additional recommendations. Electronic Signature([name]) Signed: 02/26/2023 2:33:47 PM By: Allen DerryStone, Karrington Mccravy PA-C Entered By: Allen DerryStone, Keyshla Tunison on 02/26/2023 14:33:47 -------------------------------------------------------------------------------- HxROS Details Patient  Name: Date of Service: [name], [name]. 02/26/2023 1:30 PM Medical Record Number: [id number] Patient Account Number: [id number] Date of Birth/Sex: Treating RN: [birth year] (62 y.o. Tammy SoursM) Deaton, Bobbi Primary Care Provider: Irena Reichmannollins, Dana Other Clinician: Referring Provider: Treating Provider/Extender: Cleone SlimStone III, Rital Cavey Collins, Dana Weeks in Treatment: 0 Information Obtained From Patient  Chart Cardiovascular Medical History: Positive for: Hypertension Past Medical History Notes: hyperlipidemia Endocrine Medical History: Positive for: Type II Diabetes Time with diabetes: "I guess for a while" Treated with: Insulin Blood sugar tested every day: No Musculoskeletal Medical History: Positive for: Gout Psychiatric Medical History: Negative for: Anorexia/bulimia; Confinement Anxiety Immunizations Pneumococcal Vaccine: Received Pneumococcal Vaccination: Yes Received Pneumococcal Vaccination On or After 60th Birthday: No Implantable Devices No devices added Hospitalization / Surgery History Type of Hospitalization/Surgery Knee arthroscopy 2017 Family and Social History Cancer: No; Diabetes: Yes - Mother; Heart Disease: No; Hereditary Spherocytosis: No; Hypertension: Yes - Siblings; Kidney Disease: No; Lung Disease: No; Seizures: No; Stroke: No; Thyroid Problems: No; Tuberculosis: No; Never smoker; Marital Status - Single; Alcohol Use: Never; Drug Use: No History; Caffeine Use: Rarely; Financial Concerns: No; Food, Clothing or Shelter Needs: No; Support System Lacking: No; Transportation Concerns: No Electronic Signature([name]) Signed: 02/26/2023 4:16:30 PM By: Shawn Stall RN, BSN Signed: 02/26/2023 5:51:12 PM By: Allen Derry PA-C Entered By: Shawn Stall on 02/25/2023 14:49:38 [name] ([id number]) [id number] Page 7 of 7 -------------------------------------------------------------------------------- SuperBill Details Patient Name: Date of  Service: [name], [name] 02/26/2023 Medical Record Number: [id number] Patient Account Number: [id number] Date of Birth/Sex: Treating RN: [birth year] (62 y.o. [name], [name] Primary Care Provider: [name] Other Clinician: Referring Provider: Treating Provider/Extender: Lolita Patella Weeks in Treatment: 0 Diagnosis Coding ICD-10 Codes Code Description [phone] Type 2 diabetes mellitus with other skin ulcer I89.0 Lymphedema, not elsewhere classified L97.822 Non-pressure chronic ulcer of other part of left lower leg with fat layer exposed L03.116 Cellulitis of left lower limb Facility Procedures : CPT4 Code: [id number] Description: 99214 - WOUND CARE VISIT-LEV 4 EST PT Modifier: Quantity: 1 Physician Procedures : CPT4 Code Description Modifier [id number] - WC PHYS LEVEL 4 - NEW PT ICD-10 Diagnosis Description E11.622 Type 2 diabetes mellitus with other skin ulcer I89.0 Lymphedema, not elsewhere classified L97.822 Non-pressure chronic ulcer of other part of  left lower leg with fat layer exposed L03.116 Cellulitis of left lower limb Quantity: 1 Electronic Signature([name]) Signed: 02/26/2023 2:34:09 PM By: Allen Derry PA-C Entered By: Allen Derry on 02/26/2023 14:34:08

## 2023-03-03 ENCOUNTER — Ambulatory Visit (HOSPITAL_BASED_OUTPATIENT_CLINIC_OR_DEPARTMENT_OTHER): Payer: Medicare HMO | Admitting: General Surgery

## 2023-03-04 DIAGNOSIS — F70 Mild intellectual disabilities: Secondary | ICD-10-CM | POA: Diagnosis not present

## 2023-03-04 DIAGNOSIS — I2510 Atherosclerotic heart disease of native coronary artery without angina pectoris: Secondary | ICD-10-CM | POA: Diagnosis not present

## 2023-03-04 DIAGNOSIS — E1140 Type 2 diabetes mellitus with diabetic neuropathy, unspecified: Secondary | ICD-10-CM | POA: Diagnosis not present

## 2023-03-04 DIAGNOSIS — I10 Essential (primary) hypertension: Secondary | ICD-10-CM | POA: Diagnosis not present

## 2023-03-04 DIAGNOSIS — L0390 Cellulitis, unspecified: Secondary | ICD-10-CM | POA: Diagnosis not present

## 2023-03-04 DIAGNOSIS — I872 Venous insufficiency (chronic) (peripheral): Secondary | ICD-10-CM | POA: Diagnosis not present

## 2023-03-04 DIAGNOSIS — E1151 Type 2 diabetes mellitus with diabetic peripheral angiopathy without gangrene: Secondary | ICD-10-CM | POA: Diagnosis not present

## 2023-03-04 DIAGNOSIS — M79606 Pain in leg, unspecified: Secondary | ICD-10-CM | POA: Diagnosis not present

## 2023-03-04 DIAGNOSIS — M7989 Other specified soft tissue disorders: Secondary | ICD-10-CM | POA: Diagnosis not present

## 2023-03-05 ENCOUNTER — Encounter (HOSPITAL_BASED_OUTPATIENT_CLINIC_OR_DEPARTMENT_OTHER): Payer: Medicare HMO | Admitting: Physician Assistant

## 2023-03-05 DIAGNOSIS — L97222 Non-pressure chronic ulcer of left calf with fat layer exposed: Secondary | ICD-10-CM | POA: Diagnosis not present

## 2023-03-05 DIAGNOSIS — L03116 Cellulitis of left lower limb: Secondary | ICD-10-CM | POA: Diagnosis not present

## 2023-03-05 DIAGNOSIS — E11622 Type 2 diabetes mellitus with other skin ulcer: Secondary | ICD-10-CM | POA: Diagnosis not present

## 2023-03-05 DIAGNOSIS — L97822 Non-pressure chronic ulcer of other part of left lower leg with fat layer exposed: Secondary | ICD-10-CM | POA: Diagnosis not present

## 2023-03-05 DIAGNOSIS — I890 Lymphedema, not elsewhere classified: Secondary | ICD-10-CM | POA: Diagnosis not present

## 2023-03-06 NOTE — Progress Notes (Signed)
TAYSHAWN, PURNELL ([id number]) [id number] Page 1 of 6 Visit Report for 03/05/2023 Chief Complaint Document Details Patient Name: Date of Service: [name], [name] 03/05/2023 11:00 A M Medical Record Number: [id number] Patient Account Number: [id number] Date of Birth/Sex: Treating RN: 1961/06/12 (62 y.o. M) Primary Care Provider: [name] Other Clinician: Referring Provider: Treating Provider/Extender: Cleone Slim in Treatment: 1 Information Obtained from: Patient Chief Complaint Left LE Ulcers with Bilateral Lymphedema Electronic Signature(s) Signed: 03/05/2023 11:35:29 AM By: Allen Derry PA-C Entered By: Allen Derry on 03/05/2023 11:35:28 -------------------------------------------------------------------------------- HPI Details Patient Name: Date of Service: [name]. 03/05/2023 11:00 A M Medical Record Number: [id number] Patient Account Number: [id number] Date of Birth/Sex: Treating RN: 14-Aug-1961 (62 y.o. M) Primary Care Provider: [name] Other Clinician: Referring Provider: Treating Provider/Extender: Cleone Slim in Treatment: 1 History of Present Illness HPI Description: 02/20/18; is a 62 year old man who is a poorly controlled type II diabetic with most recent hemoglobin A1c of 12.9 in January of this year. 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 He has not worn compression stockings. He is a nonsmoker He tells [place] that he has a history of wounds on his bilateral lower legs. It is really unclear when this started although he thinks they were there at Christmas time 2018 therefore they are not new. He was seen in his primary doctor's office on 02/05/18 noted to have skin breakdown and erythema. This was especially in the left leg he was given doxycycline and Lasix. He was seen in their clinic again yesterday. Felt to have left  leg cellulitis. He was felt to possibly need debridement and he was referred here urgently. He is still on doxycycline. ABIs in this clinic were 0.89 on the right 0.97 on the left. Besides type 2 diabetes poorly controlled he has a history of proven gout, hyperlipidemia, hypertension and a history of bilateral cellulitis. He uses a walker to ambulate. 02/27/18; tolerated compression well. A lot of the areas have healed over. Still small open areas mostly on the right lateral and left posterior. I've elected to continue to dress this for another week. I would like him to come in next week with 20-30 mm below-knee stockings and we are setting him about elastic therapy to get these. I also emphasized skin lubrication. 03/06/18; everything is closed over here. He can be discharged in 20-30 mm below-knee stockings. He will need to lubricate the skin which is already cracked and fissuring. I've explained to him it is possible he will need more compression than this. We'll have to see how this goes over some time Readmission: 02-26-2023 upon evaluation today patient appears to be doing well currently all things considered in general other than the fact that he does appear to have some infection in regard to his lower extremity on the left leg. He does have evidence of bilateral lower extremity lymphedema but in particular I think that he has cellulitis at this site. Fortunately I do not see any evidence of active infection locally nor systemically which is great news. No fevers, chills, nausea, vomiting, or diarrhea. Upon inspection patient's past medical history really has not changed significantly since we last saw him. He really does not have any major medical problems other than the lymphedema and diabetes. Unfortunately I think he just became cellulitic secondary to a crack in the back of the lymphedematous tissue on  the left lower extremity that is what is causing the severe pain for him. He has had his  Lasix increased by his primary care provider to 40 mg twice a day. He also currently has a negative DVT by Doppler confirmed which was stated in the primary care note as well which was reviewed today. He is on Eliquis. 03-05-2023 upon evaluation today patient unfortunately appears to be doing worse in regard to his wound. He has been tolerating the dressing changes without complication. Fortunately there does not appear to be any signs of active infection systemically though locally still has cellulitis. He has been taking the Bactrim as I prescribed last week unfortunately I think this is quite doing the job. Electronic Signature(s) Signed: 03/05/2023 11:36:10 AM By: Allen Derry PA-C Entered By: Allen Derry on 03/05/2023 11:36:10 [name] ([id number]) [id number] Page 2 of 6 -------------------------------------------------------------------------------- Physical Exam Details Patient Name: Date of Service: [name], [name] 03/05/2023 11:00 A M Medical Record Number: [id number] Patient Account Number: [id number] Date of Birth/Sex: Treating RN: Jun 09, 1961 (62 y.o. M) Primary Care Provider: [name] Other Clinician: Referring Provider: Treating Provider/Extender: Cleone Slim in Treatment: 1 Constitutional Well-nourished and well-hydrated in no acute distress. Respiratory normal breathing without difficulty. Psychiatric this patient is able to make decisions and demonstrates good insight into disease process. Alert and Oriented x 3. pleasant and cooperative. Notes Upon inspection patient's wound bed actually showed signs of more significant inflammation and irritation at this point. He still has cellulitis noted left lower extremity this is despite the Bactrim I think we may need to add Cipro to try to see if we can take care of this. I am not sure if this is a completely different organism such as Pseudomonas in which case again  the Bactrim what to do the job. If this does not work over the next week to improve his symptoms and pain as well as get the erythema and warmth down then we will likely need to send him to the ER for further evaluation and treatment. Electronic Signature(s) Signed: 03/05/2023 11:36:55 AM By: Allen Derry PA-C Entered By: Allen Derry on 03/05/2023 11:36:55 -------------------------------------------------------------------------------- Physician Orders Details Patient Name: Date of Service: TARVIS, BLOSSOM 03/05/2023 11:00 A M Medical Record Number: [id number] Patient Account Number: [id number] Date of Birth/Sex: Treating RN: 1961/04/15 (62 y.o. [name], [name] Primary Care Provider: [name] Other Clinician: Referring Provider: Treating Provider/Extender: Cleone Slim in Treatment: 1 Verbal / Phone Orders: No Diagnosis Coding ICD-10 Coding Code Description E11.622 Type 2 diabetes mellitus with other skin ulcer I89.0 Lymphedema, not elsewhere classified L97.822 Non-pressure chronic ulcer of other part of left lower leg with fat layer exposed L03.116 Cellulitis of left lower limb Follow-up Appointments ppointment in 1 week. Leonard Schwartz Wednesday 03/12/2023 2pm room 7 Return A ppointment in 2 weeks. Leonard Schwartz Wednesday 03/19/2023 2pm room 7 Return A Other: - Go pick up additional antibiotics from pharmacy. Continue current antibiotics and start taking the other today. If wound, left leg worsens in redness, pain, swelling, or fever go to the emergency department. Anesthetic (In clinic) Topical Lidocaine 4% applied to wound bed Bathing/ Shower/ Hygiene May shower and wash wound with soap and water. Edema Control - Lymphedema / SCD / Other Elevate legs to the level of the heart or above for 30 minutes daily and/or when sitting for 3-4 times a day throughout the day. Avoid standing for long periods of time. Exercise regularly Home Health  Admit to Home Health for  skilled nursing wound care. May utilize formulary equivalent dressing for wound treatment orders unless otherwise specified. New wound care orders this week; continue Home Health for wound care. May utilize formulary equivalent dressing for wound treatment orders unless otherwise specified. - home health to change Mondays and Fridays, wound center Wednesdays; home health to please closely XADEN, KAUFMAN ([id number]) [id number] Page 3 of 6 monitor left leg if worsens please send patient to the emergency room. patient will start a second antibiotics today, totally two antibiotics. Other Home Health Orders/Instructions: [facility] Wound Treatment Wound #3 - Lower Leg Wound Laterality: Left, Posterior Cleanser: Soap and Water 3 x Per Week/30 Days Discharge Instructions: May shower and wash wound with dial antibacterial soap and water prior to dressing change. Peri-Wound Care: Zinc Oxide Ointment 30g tube 3 x Per Week/30 Days Discharge Instructions: Apply Zinc Oxide to periwound with each dressing change Peri-Wound Care: Sween Lotion (Moisturizing lotion) 3 x Per Week/30 Days Discharge Instructions: Apply moisturizing lotion as directed Prim Dressing: Maxorb Extra Ag+ Alginate Dressing, 4x4.75 (in/in) 3 x Per Week/30 Days ary Discharge Instructions: Apply to wound bed as instructed Secondary Dressing: ABD Pad, 8x10 3 x Per Week/30 Days Discharge Instructions: Apply over primary dressing as directed. Compression Wrap: Urgo K2 Lite, two layer compression system, regular 3 x Per Week/30 Days Discharge Instructions: Apply Urgo K2 Lite as directed (alternative to 3 layer compression). Patient Medications llergies: No Known Allergies A Notifications Medication Indication Start End 03/05/2023 Cipro DOSE 1 - oral 500 mg tablet - 1 tablet oral twice a day x 14 days Electronic Signature(s) Signed: 03/05/2023 11:37:52 AM By: Allen Derry PA-C Entered By: Allen Derry on 03/05/2023 11:37:51 -------------------------------------------------------------------------------- Problem List Details Patient Name: Date of Service: [name]. 03/05/2023 11:00 A M Medical Record Number: [id number] Patient Account Number: [id number] Date of Birth/Sex: Treating RN: 04/20/1961 (62 y.o. [name] Primary Care Provider: [name] Other Clinician: Referring Provider: Treating Provider/Extender: Cleone Slim in Treatment: 1 Active Problems ICD-10 Encounter Code Description Active Date MDM Diagnosis E11.622 Type 2 diabetes mellitus with other skin ulcer 02/26/2023 No Yes I89.0 Lymphedema, not elsewhere classified 02/26/2023 No Yes L97.822 Non-pressure chronic ulcer of other part of left lower leg with fat layer exposed4/08/2023 No Yes L03.116 Cellulitis of left lower limb 02/26/2023 No Yes Inactive Problems Resolved Problems ARLANDO, LEISINGER ([id number]) [id number] Page 4 of 6 Electronic Signature(s) Signed: 03/05/2023 6:42:55 PM By: Allen Derry PA-C Signed: 03/06/2023 2:41:21 PM By: Shawn Stall RN, BSN Entered By: Shawn Stall on 03/05/2023 11:08:10 -------------------------------------------------------------------------------- Progress Note Details Patient Name: Date of Service: [name]. 03/05/2023 11:00 A M Medical Record Number: [id number] Patient Account Number: [id number] Date of Birth/Sex: Treating RN: October 31, 1961 (62 y.o. M) Primary Care Provider: [name] Other Clinician: Referring Provider: Treating Provider/Extender: Cleone Slim in Treatment: 1 Subjective Chief Complaint Information obtained from Patient Left LE Ulcers with Bilateral Lymphedema History of Present Illness (HPI) 02/20/18; is a 62 year old man who is a poorly controlled type II diabetic with most recent hemoglobin A1c of 12.9 in January of this year. 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 He has not worn compression stockings. He is a nonsmoker He tells [place] that he has a history of wounds on his bilateral lower legs. It is really unclear when this started although he  thinks they were there at Christmas time 2018 therefore they are not new. He was seen in his primary doctor's office on 02/05/18 noted to have skin breakdown and erythema. This was especially in the left leg he was given doxycycline and Lasix. He was seen in their clinic again yesterday. Felt to have left leg cellulitis. He was felt to possibly need debridement and he was referred here urgently. He is still on doxycycline. ABIs in this clinic were 0.89 on the right 0.97 on the left. Besides type 2 diabetes poorly controlled he has a history of proven gout, hyperlipidemia, hypertension and a history of bilateral cellulitis. He uses a walker to ambulate. 02/27/18; tolerated compression well. A lot of the areas have healed over. Still small open areas mostly on the right lateral and left posterior. I've elected to continue to dress this for another week. I would like him to come in next week with 20-30 mm below-knee stockings and we are setting him about elastic therapy to get these. I also emphasized skin lubrication. 03/06/18; everything is closed over here. He can be discharged in 20-30 mm below-knee stockings. He will need to lubricate the skin which is already cracked and fissuring. I've explained to him it is possible he will need more compression than this. We'll have to see how this goes over some time Readmission: 02-26-2023 upon evaluation today patient appears to be doing well currently all things considered in general other than the fact that he does appear to have some infection in regard to his lower extremity on the left leg. He does have evidence of bilateral lower extremity lymphedema but in particular I think that he has cellulitis at  this site. Fortunately I do not see any evidence of active infection locally nor systemically which is great news. No fevers, chills, nausea, vomiting, or diarrhea. Upon inspection patient's past medical history really has not changed significantly since we last saw him. He really does not have any major medical problems other than the lymphedema and diabetes. Unfortunately I think he just became cellulitic secondary to a crack in the back of the lymphedematous tissue on the left lower extremity that is what is causing the severe pain for him. He has had his Lasix increased by his primary care provider to 40 mg twice a day. He also currently has a negative DVT by Doppler confirmed which was stated in the primary care note as well which was reviewed today. He is on Eliquis. 03-05-2023 upon evaluation today patient unfortunately appears to be doing worse in regard to his wound. He has been tolerating the dressing changes without complication. Fortunately there does not appear to be any signs of active infection systemically though locally still has cellulitis. He has been taking the Bactrim as I prescribed last week unfortunately I think this is quite doing the job. Objective Constitutional Well-nourished and well-hydrated in no acute distress. Vitals Time Taken: 10:53 AM, Height: 65 in, Weight: 240 lbs, BMI: 39.9, Temperature: 98 F, Pulse: 76 bpm, Respiratory Rate: 18 breaths/min, Blood Pressure: 155/95 mmHg, Capillary Blood Glucose: 122 mg/dl. Respiratory normal breathing without difficulty. Psychiatric this patient is able to make decisions and demonstrates good insight into disease process. Alert and Oriented x 3. pleasant and cooperative. General Notes: Upon inspection patient's wound bed actually showed signs of more significant inflammation and irritation at this point. He still has cellulitis ROGUE, RAFALSKI ([id number]) [id number] Page 5 of 6 noted left lower  extremity this is despite the Bactrim I  think we may need to add Cipro to try to see if we can take care of this. I am not sure if this is a completely different organism such as Pseudomonas in which case again the Bactrim what to do the job. If this does not work over the next week to improve his symptoms and pain as well as get the erythema and warmth down then we will likely need to send him to the ER for further evaluation and treatment. Integumentary (Hair, Skin) Wound #3 status is Open. Original cause of wound was Gradually Appeared. The date acquired was: 01/18/2023. The wound has been in treatment 1 weeks. The wound is located on the Left,Posterior Lower Leg. The wound measures 13cm length x 11cm width x 0.1cm depth; 112.312cm^2 area and 11.231cm^3 volume. There is Fat Layer (Subcutaneous Tissue) exposed. There is no tunneling or undermining noted. There is a medium amount of serosanguineous drainage noted. The wound margin is epibole. There is a large (67-100%) amount of necrotic tissue within the wound bed including Eschar and Adherent Slough. The periwound skin appearance exhibited: Dry/Scaly, Hemosiderin Staining, Erythema. The periwound skin appearance did not exhibit: Callus, Crepitus, Excoriation, Induration, Rash, Scarring, Maceration, Atrophie Blanche, Cyanosis, Ecchymosis, Mottled, Pallor, Rubor. The surrounding wound skin color is noted with erythema which is circumferential. Periwound temperature was noted as No Abnormality. Assessment Active Problems ICD-10 Type 2 diabetes mellitus with other skin ulcer Lymphedema, not elsewhere classified Non-pressure chronic ulcer of other part of left lower leg with fat layer exposed Cellulitis of left lower limb Procedures Wound #3 Pre-procedure diagnosis of Wound #3 is a Diabetic Wound/Ulcer of the Lower Extremity located on the Left,Posterior Lower Leg . There was a Double Layer Compression Therapy Procedure by Shawn Stall, RN. Post  procedure Diagnosis Wound #3: Same as Pre-Procedure Plan Follow-up Appointments: Return Appointment in 1 week. Leonard Schwartz Wednesday 03/12/2023 2pm room 7 Return Appointment in 2 weeks. Leonard Schwartz Wednesday 03/19/2023 2pm room 7 Other: - Go pick up additional antibiotics from pharmacy. Continue current antibiotics and start taking the other today. If wound, left leg worsens in redness, pain, swelling, or fever go to the emergency department. Anesthetic: (In clinic) Topical Lidocaine 4% applied to wound bed Bathing/ Shower/ Hygiene: May shower and wash wound with soap and water. Edema Control - Lymphedema / SCD / Other: Elevate legs to the level of the heart or above for 30 minutes daily and/or when sitting for 3-4 times a day throughout the day. Avoid standing for long periods of time. Exercise regularly Home Health: Admit to Home Health for skilled nursing wound care. May utilize formulary equivalent dressing for wound treatment orders unless otherwise specified. New wound care orders this week; continue Home Health for wound care. May utilize formulary equivalent dressing for wound treatment orders unless otherwise specified. - home health to change Mondays and Fridays, wound center Wednesdays; home health to please closely monitor left leg if worsens please send patient to the emergency room. patient will start a second antibiotics today, totally two antibiotics. Other Home Health Orders/Instructions: [facility] The following medication(s) was prescribed: Cipro oral 500 mg tablet 1 1 tablet oral twice a day x 14 days starting 03/05/2023 WOUND #3: - Lower Leg Wound Laterality: Left, Posterior Cleanser: Soap and Water 3 x Per Week/30 Days Discharge Instructions: May shower and wash wound with dial antibacterial soap and water prior to dressing change. Peri-Wound Care: Zinc Oxide Ointment 30g tube 3 x Per Week/30 Days Discharge Instructions: Apply  Zinc Oxide to periwound with each dressing  change Peri-Wound Care: Sween Lotion (Moisturizing lotion) 3 x Per Week/30 Days Discharge Instructions: Apply moisturizing lotion as directed Prim Dressing: Maxorb Extra Ag+ Alginate Dressing, 4x4.75 (in/in) 3 x Per Week/30 Days ary Discharge Instructions: Apply to wound bed as instructed Secondary Dressing: ABD Pad, 8x10 3 x Per Week/30 Days Discharge Instructions: Apply over primary dressing as directed. Com pression Wrap: Urgo K2 Lite, two layer compression system, regular 3 x Per Week/30 Days Discharge Instructions: Apply Urgo K2 Lite as directed (alternative to 3 layer compression). 1. I would go ahead and send in a prescription for Cipro 500 mg he is going to take 1 pill twice a day for 14 days. 2. I am also can recommend that we have the patient go ahead and initiate treatment with a compression wrap. We did do a 3 layer compression wrap and the patient does have home health so they will be able to change this out for him as well. He is in agreement with that plan. 3. I am also can recommend that he should continue to elevate his leg he has been doing everything I am telling him I firmly believe that. I am hopeful that he will continue to show improvement with the new antibiotic I will see him next week and we will see where things stand. HERLEY, BERNARDINI ([id number]) [id number] Page 6 of 6 We will see patient back for reevaluation in 1 week here in the clinic. If anything worsens or changes patient will contact our office for additional recommendations. Electronic Signature(s) Signed: 03/05/2023 11:38:15 AM By: Allen Derry PA-C Entered By: Allen Derry on 03/05/2023 11:38:14 -------------------------------------------------------------------------------- SuperBill Details Patient Name: Date of Service: [name] 03/05/2023 Medical Record Number: [id number] Patient Account Number: [id number] Date of Birth/Sex: Treating RN: 11/28/1960 (62 y.o. [name],  Millard.Loa Primary Care Provider: [name] Other Clinician: Referring Provider: Treating Provider/Extender: Lolita Patella Weeks in Treatment: 1 Diagnosis Coding ICD-10 Codes Code Description [phone] Type 2 diabetes mellitus with other skin ulcer I89.0 Lymphedema, not elsewhere classified L97.822 Non-pressure chronic ulcer of other part of left lower leg with fat layer exposed L03.116 Cellulitis of left lower limb Facility Procedures : CPT4 Code: [id number] Description: (Facility Use Only) [phone] - APPLY MULTLAY COMPRS LWR LT LEG Modifier: Quantity: 1 Physician Procedures : CPT4 Code Description Modifier [id number] - WC PHYS LEVEL 4 - EST PT ICD-10 Diagnosis Description E11.622 Type 2 diabetes mellitus with other skin ulcer I89.0 Lymphedema, not elsewhere classified L97.822 Non-pressure chronic ulcer of other part of  left lower leg with fat layer exposed L03.116 Cellulitis of left lower limb Quantity: 1 Electronic Signature(s) Signed: 03/05/2023 11:38:30 AM By: Allen Derry PA-C Entered By: Allen Derry on 03/05/2023 11:38:29

## 2023-03-07 NOTE — Progress Notes (Signed)
[name], [name] ([id number]) [id number] Page 1 of 6 Visit Report for 03/05/2023 Arrival Information Details Patient Name: Date of Service: [name], [name] 03/05/2023 11:00 A M Medical Record Number: [id number] Patient Account Number: [id number] Date of Birth/Sex: Treating RN: Jan 25, 1961 (62 y.o. M) Primary Care Chesley Veasey: [name] Other Clinician: Referring Terelle Dobler: Treating Renesha Lizama/Extender: Cleone Slim in Treatment: 1 Visit Information History Since Last Visit Added or deleted any medications: No Patient Arrived: [name] Any new allergies or adverse reactions: No Arrival Time: 10:53 Had a fall or experienced change in No Accompanied By: self activities of daily living that may affect Transfer Assistance: None risk of falls: Patient Identification Verified: Yes Signs or symptoms of abuse/neglect since last visito No Secondary Verification Process Completed: Yes Hospitalized since last visit: No Implantable device outside of the clinic excluding No cellular tissue based products placed in the center since last visit: Has Dressing in Place as Prescribed: No Pain Present Now: Yes Electronic Signature(s) Signed: 03/07/2023 11:38:03 AM By: Thayer Dallas Entered By: Thayer Dallas on 03/05/2023 10:53:39 -------------------------------------------------------------------------------- Compression Therapy Details Patient Name: Date of Service: [name]. 03/05/2023 11:00 A M Medical Record Number: [id number] Patient Account Number: [id number] Date of Birth/Sex: Treating RN: 1961-05-28 (62 y.o. [name] Primary Care Dayshia Ballinas: [name] Other Clinician: Referring Homer Pfeifer: Treating Shalaunda Weatherholtz/Extender: Cleone Slim in Treatment: 1 Compression Therapy Performed for Wound Assessment: Wound #3 Left,Posterior Lower Leg Performed By: Clinician Shawn Stall, RN Compression Type: Double  Layer Post Procedure Diagnosis Same as Pre-procedure Electronic Signature(s) Signed: 03/06/2023 2:41:21 PM By: Shawn Stall RN, BSN Entered By: Shawn Stall on 03/05/2023 11:32:19 -------------------------------------------------------------------------------- Encounter Discharge Information Details Patient Name: Date of Service: [name]. 03/05/2023 11:00 A M Medical Record Number: [id number] Patient Account Number: [id number] Date of Birth/Sex: Treating RN: 05/04/1961 (62 y.o. [name] Primary Care Whittley Carandang: [name] Other Clinician: Referring Joselyn Edling: Treating Elmarie Devlin/Extender: Cleone Slim in Treatment: 1 Encounter Discharge Information Items Discharge Condition: Stable Ambulatory Status: [facility] Discharge Destination: Home Transportation: Private Auto Accompanied By: 1 [place] [name] TAMEL, ABEL ([id number]) [id number] Page 2 of 6 Schedule Follow-up Appointment: Yes Clinical Summary of Care: Electronic Signature(s) Signed: 03/06/2023 2:41:21 PM By: Shawn Stall RN, BSN Entered By: Shawn Stall on 03/05/2023 11:32:52 -------------------------------------------------------------------------------- Lower Extremity Assessment Details Patient Name: Date of Service: KYUSS, HALE 03/05/2023 11:00 A M Medical Record Number: [id number] Patient Account Number: [id number] Date of Birth/Sex: Treating RN: 1961/06/14 (62 y.o. M) Primary Care Deziree Mokry: [name] Other Clinician: Referring Salaya Holtrop: Treating Keithen Capo/Extender: Lolita Patella Weeks in Treatment: 1 Edema Assessment Assessed: [Left: No] [Right: No] Edema: [Left: Ye] [Right: s] Calf Left: Right: Point of Measurement: 30 cm From Medial Instep 51 cm Ankle Left: Right: Point of Measurement: 10 cm From Medial Instep 33.2 cm Electronic Signature(s) Signed: 03/07/2023 11:38:03 AM By: Thayer Dallas Entered By: Thayer Dallas on 03/05/2023  10:59:20 -------------------------------------------------------------------------------- Multi-Disciplinary Care Plan Details Patient Name: Date of Service: [name]. 03/05/2023 11:00 A M Medical Record Number: [id number] Patient Account Number: [id number] Date of Birth/Sex: Treating RN: 07/03/1961 (62 y.o. [name] Primary Care Acen Craun: [name] Other Clinician: Referring Vong Garringer: Treating Nikeria Kalman/Extender: Cleone Slim in Treatment: 1 Active Inactive Pain, Acute or Chronic Nursing Diagnoses: Pain, acute or chronic: actual or potential Potential alteration in comfort, pain Goals: Patient will verbalize adequate pain control and receive pain control interventions during procedures as needed Date Initiated: 02/26/2023 Target Resolution  Date: 03/28/2023 Goal Status: Active Patient/caregiver will verbalize comfort level met Date Initiated: 02/26/2023 Target Resolution Date: 03/28/2023 Goal Status: Active Interventions: Encourage patient to take pain medications as prescribed Provide education on pain management Reposition patient for comfort Treatment Activities: MINDY, BEHNKEN ([id number]) [id number] Page 3 of 6 Administer pain control measures as ordered : 02/26/2023 Notes: Wound/Skin Impairment Nursing Diagnoses: Knowledge deficit related to ulceration/compromised skin integrity Goals: Patient/caregiver will verbalize understanding of skin care regimen Date Initiated: 02/26/2023 Target Resolution Date: 03/28/2023 Goal Status: Active Interventions: Assess patient/caregiver ability to perform ulcer/skin care regimen upon admission and as needed Assess ulceration(s) every visit Provide education on ulcer and skin care Treatment Activities: Skin care regimen initiated : 02/26/2023 Topical wound management initiated : 02/26/2023 Notes: Electronic Signature(s) Signed: 03/06/2023 2:41:21 PM By: Shawn Stall  RN, BSN Entered By: Shawn Stall on 03/05/2023 11:08:19 -------------------------------------------------------------------------------- Pain Assessment Details Patient Name: Date of Service: [name]. 03/05/2023 11:00 A M Medical Record Number: [id number] Patient Account Number: [id number] Date of Birth/Sex: Treating RN: [birth year] (62 y.o. M) Primary Care Sheril Hammond: [name] Other Clinician: Referring Mallarie Voorhies: Treating Lacreasha Hinds/Extender: Cleone Slim in Treatment: 1 Active Problems Location of Pain Severity and Description of Pain Patient Has Paino Yes Site Locations Pain Location: Pain in Ulcers Rate the pain. Current Pain Level: 8 Pain Management and Medication Current Pain Management: How does your wound impact your activities of daily livingo Sleep: Yes Electronic Signature(s) Signed: 03/07/2023 11:38:03 AM By: Thayer Dallas Entered By: Thayer Dallas on 03/05/2023 10:59:05 [name] ([id number]) [id number] Page 4 of 6 -------------------------------------------------------------------------------- Patient/Caregiver Education Details Patient Name: Date of Service: GAYLEN, VENNING 4/17/2024andnbsp11:00 A M Medical Record Number: [id number] Patient Account Number: [id number] Date of Birth/Gender: Treating RN: [birth year] (62 y.o. [name] Primary Care Physician: [name] Other Clinician: Referring Physician: Treating Physician/Extender: Cleone Slim in Treatment: 1 Education Assessment Education Provided To: Patient Education Topics Provided Wound/Skin Impairment: Handouts: Caring for Your Ulcer Methods: Explain/Verbal Responses: Reinforcements needed Electronic Signature(s) Signed: 03/06/2023 2:41:21 PM By: Shawn Stall RN, BSN Entered By: Shawn Stall on 03/05/2023  11:08:29 -------------------------------------------------------------------------------- Wound Assessment Details Patient Name: Date of Service: [name]. 03/05/2023 11:00 A M Medical Record Number: [id number] Patient Account Number: [id number] Date of Birth/Sex: Treating RN: 1961/07/05 (62 y.o. M) Primary Care Dyna Figuereo: [name] Other Clinician: Referring Ural Acree: Treating Shown Dissinger/Extender: Cleone Slim in Treatment: 1 Wound Status Wound Number: 3 Primary Etiology: Diabetic Wound/Ulcer of the Lower Extremity Wound Location: Left, Posterior Lower Leg Secondary Etiology: Lymphedema Wounding Event: Gradually Appeared Wound Status: Open Date Acquired: 01/18/2023 Comorbid History: Hypertension, Type II Diabetes, Gout Weeks Of Treatment: 1 Clustered Wound: No Photos Wound Measurements Length: (cm) 13 Width: (cm) 11 Depth: (cm) 0.1 Area: (cm) 112.312 Volume: (cm) 11.231 % Reduction in Area: -642.9% % Reduction in Volume: -642.8% Epithelialization: Small (1-33%) Tunneling: No Undermining: No Wound Description Classification: Grade 1 Bora, Jebadiah S ([id number]) Wound Margin: Epibole Exudate Amount: Medium Exudate Type: Serosanguineous Exudate Color: red, brown Foul Odor After Cleansing: No [id number] Page 5 of 6 Slough/Fibrino Yes Wound Bed Necrotic Amount: Large (67-100%) Exposed Structure Necrotic Quality: Eschar, Adherent Slough Fascia Exposed: No Fat Layer (Subcutaneous Tissue) Exposed: Yes Tendon Exposed: No Muscle Exposed: No Joint Exposed: No Bone Exposed: No Periwound Skin Texture Texture Color No Abnormalities Noted: No No Abnormalities Noted: No Callus: No Atrophie Blanche: No Crepitus: No Cyanosis: No Excoriation: No Ecchymosis: No Induration: No Erythema: Yes Rash:  No Erythema Location: Circumferential Scarring: No Hemosiderin Staining: Yes Mottled: No Moisture Pallor: No No  Abnormalities Noted: No Rubor: No Dry / Scaly: Yes Maceration: No Temperature / Pain Temperature: No Abnormality Treatment Notes Wound #3 (Lower Leg) Wound Laterality: Left, Posterior Cleanser Soap and Water Discharge Instruction: May shower and wash wound with dial antibacterial soap and water prior to dressing change. Peri-Wound Care Zinc Oxide Ointment 30g tube Discharge Instruction: Apply Zinc Oxide to periwound with each dressing change Sween Lotion (Moisturizing lotion) Discharge Instruction: Apply moisturizing lotion as directed Topical Primary Dressing Maxorb Extra Ag+ Alginate Dressing, 4x4.75 (in/in) Discharge Instruction: Apply to wound bed as instructed Secondary Dressing ABD Pad, 8x10 Discharge Instruction: Apply over primary dressing as directed. Secured With Compression Wrap Urgo K2 Lite, two layer compression system, regular Discharge Instruction: Apply Urgo K2 Lite as directed (alternative to 3 layer compression). Compression Stockings Add-Ons Electronic Signature(s) Signed: 03/07/2023 11:38:03 AM By: Thayer Dallas Entered By: Thayer Dallas on 03/05/2023 11:01:19 -------------------------------------------------------------------------------- Vitals Details Patient Name: Date of Service: [name]. 03/05/2023 11:00 A M Medical Record Number: [id number] Patient Account Number: [id number] JAJUAN, SKOOG ([id number]) [id number] Page 6 of 6 Date of Birth/Sex: Treating RN: 05/06/1961 (62 y.o. M) Primary Care Ayame Rena: Other Clinician: [name] Referring Daily Crate: Treating Azavier Creson/Extender: Cleone Slim in Treatment: 1 Vital Signs Time Taken: 10:53 Temperature (F): 98 Height (in): 65 Pulse (bpm): 76 Weight (lbs): 240 Respiratory Rate (breaths/min): 18 Body Mass Index (BMI): 39.9 Blood Pressure (mmHg): 155/95 Capillary Blood Glucose (mg/dl): 782 Reference Range: 80 - 120 mg / dl Electronic  Signature(s) Signed: 03/07/2023 11:38:03 AM By: Thayer Dallas Entered By: Thayer Dallas on 03/05/2023 10:58:48

## 2023-03-11 DIAGNOSIS — I872 Venous insufficiency (chronic) (peripheral): Secondary | ICD-10-CM | POA: Diagnosis not present

## 2023-03-11 DIAGNOSIS — M79606 Pain in leg, unspecified: Secondary | ICD-10-CM | POA: Diagnosis not present

## 2023-03-11 DIAGNOSIS — L0390 Cellulitis, unspecified: Secondary | ICD-10-CM | POA: Diagnosis not present

## 2023-03-11 DIAGNOSIS — F70 Mild intellectual disabilities: Secondary | ICD-10-CM | POA: Diagnosis not present

## 2023-03-11 DIAGNOSIS — E1140 Type 2 diabetes mellitus with diabetic neuropathy, unspecified: Secondary | ICD-10-CM | POA: Diagnosis not present

## 2023-03-11 DIAGNOSIS — I2510 Atherosclerotic heart disease of native coronary artery without angina pectoris: Secondary | ICD-10-CM | POA: Diagnosis not present

## 2023-03-11 DIAGNOSIS — M7989 Other specified soft tissue disorders: Secondary | ICD-10-CM | POA: Diagnosis not present

## 2023-03-11 DIAGNOSIS — E1151 Type 2 diabetes mellitus with diabetic peripheral angiopathy without gangrene: Secondary | ICD-10-CM | POA: Diagnosis not present

## 2023-03-11 DIAGNOSIS — I10 Essential (primary) hypertension: Secondary | ICD-10-CM | POA: Diagnosis not present

## 2023-03-12 ENCOUNTER — Ambulatory Visit: Payer: Self-pay | Admitting: *Deleted

## 2023-03-12 ENCOUNTER — Encounter (HOSPITAL_BASED_OUTPATIENT_CLINIC_OR_DEPARTMENT_OTHER): Payer: Medicare HMO | Admitting: Physician Assistant

## 2023-03-12 DIAGNOSIS — L03116 Cellulitis of left lower limb: Secondary | ICD-10-CM | POA: Diagnosis not present

## 2023-03-12 DIAGNOSIS — I890 Lymphedema, not elsewhere classified: Secondary | ICD-10-CM | POA: Diagnosis not present

## 2023-03-12 DIAGNOSIS — L97822 Non-pressure chronic ulcer of other part of left lower leg with fat layer exposed: Secondary | ICD-10-CM | POA: Diagnosis not present

## 2023-03-12 DIAGNOSIS — L97222 Non-pressure chronic ulcer of left calf with fat layer exposed: Secondary | ICD-10-CM | POA: Diagnosis not present

## 2023-03-12 DIAGNOSIS — E11622 Type 2 diabetes mellitus with other skin ulcer: Secondary | ICD-10-CM | POA: Diagnosis not present

## 2023-03-13 ENCOUNTER — Ambulatory Visit: Payer: Self-pay | Admitting: *Deleted

## 2023-03-13 NOTE — Patient Outreach (Signed)
  Care Coordination   Follow Up Visit Note   03/13/2023 Name: [name] MRN: [id number] DOB: 12/14/1960  [name] is a 62 y.o. year old male who sees [name], [place] for primary care. I spoke with  [name] by phone today.  What matters to the patients health and wellness today?  Wound center visit yesterday went well.     Goals Addressed             This Visit's Progress    Transportation       Goals:       Collaborated with [employer]SC[employer] Rep who indicates SCAT approval through 02/2026. Call your insurance provider for more information about your Enhanced Benefits (is life alert covered?) Make sure to order from [facility] catalog before end of month for this quarterly credit to shop                    SDOH assessments and interventions completed:  Yes     Care Coordination Interventions:  Yes, provided  Interventions Today    Flowsheet Row Most Recent Value  Education Interventions   Provided Verbal Education On Community Resources  [SCAT transportation has been approved- pt to receive paperwork in mail]       Follow up plan: Follow up call scheduled for 03/17/23    Encounter Outcome:  Pt. Visit Completed

## 2023-03-13 NOTE — Patient Outreach (Signed)
  Care Coordination   Follow Up Visit Note   03/13/2023 Name: LERON STOFFERS MRN: [id number] DOB: [birth year]  GRACIANO BATSON is a 62 y.o. year old male who sees [name], [place] for primary care. I spoke with  [name] by phone today.  What matters to the patients health and wellness today?  I went to the Wound Center.     Goals Addressed             This Visit's Progress    Transportation       Goals:       Glad you were seen at the Wound Center an and going back weekly- Keep focus healing wound and following instructions provided by MD Expect a packet in mail from [facility] for how to use SCAT now that you have been approved through 02/2026. Call your insurance provider for more information about your Enhanced Benefits (is life alert covered?) Make sure to order from [facility] catalog before end of month for this quarterly credit to shop                    SDOH assessments and interventions completed:  Yes     Care Coordination Interventions:  Yes, provided  Interventions Today    Flowsheet Row Most Recent Value  Chronic Disease   Chronic disease during today's visit Diabetes  General Interventions   General Interventions Discussed/Reviewed [name], Communication with  [Spoke to [employer] rep and pt is approved for SCAT transport thru 02/2026. Packet being mailed to pt,]       Follow up plan: Follow up call scheduled for 03/17/23    Encounter Outcome:  Pt. Visit Completed

## 2023-03-13 NOTE — Patient Instructions (Signed)
Visit Information  Thank you for taking time to visit with me today. Please don't hesitate to contact me if I can be of assistance to you.   Following are the goals we discussed today:   Goals Addressed             This Visit's Progress    Transportation       Goals:       Collaborated with [employer] Rep who indicates SCAT approval through 02/2026. Call your insurance provider for more information about your Enhanced Benefits (is life alert covered?) Make sure to order from [facility] catalog before end of month for this quarterly credit to shop                    Our next appointment is by telephone on 03/17/23    Please call the care guide team at [phone] if you need to cancel or reschedule your appointment.   If you are experiencing a Mental Health or Behavioral Health Crisis or need someone to talk to, please call the Suicide and Crisis Lifeline: 988 call 911   The patient verbalized understanding of instructions, educational materials, and care plan provided today and DECLINED offer to receive copy of patient instructions, educational materials, and care plan.   Telephone follow up appointment with care management team member scheduled for:03/17/23  Reece Levy, MSW, LCSW Clinical Social Worker [employer] [phone]

## 2023-03-13 NOTE — Progress Notes (Addendum)
JACIER, BAYHA ([id number]) [id number] Page 1 of 7 Visit Report for 03/12/2023 Chief Complaint Document Details Patient Name: Date of Service: [name], ZAMBRANA 03/12/2023 2:45 PM Medical Record Number: [id number] Patient Account Number: [id number] Date of Birth/Sex: Treating RN: August 17, 1961 (62 y.o. M) Primary Care Provider: [name] Other Clinician: Referring Provider: Treating Provider/Extender: Cleone Slim in Treatment: 2 Information Obtained from: Patient Chief Complaint Left LE Ulcers with Bilateral Lymphedema Electronic Signature(s) Signed: 03/12/2023 3:08:01 PM By: Allen Derry PA-C Entered By: Allen Derry on 03/12/2023 15:08:01 -------------------------------------------------------------------------------- HPI Details Patient Name: Date of Service: [name] 03/12/2023 2:45 PM Medical Record Number: [id number] Patient Account Number: [id number] Date of Birth/Sex: Treating RN: January 15, 1961 (62 y.o. M) Primary Care Provider: [name] Other Clinician: Referring Provider: Treating Provider/Extender: Cleone Slim in Treatment: 2 History of Present Illness HPI Description: 02/20/18; is a 62 year old man who is a poorly controlled type II diabetic with most recent hemoglobin A1c of 12.9 in January of this year. 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 He has not worn compression stockings. He is a nonsmoker He tells [place] that he has a history of wounds on his bilateral lower legs. It is really unclear when this started although he thinks they were there at Christmas time 2018 therefore they are not new. He was seen in his primary doctor's office on 02/05/18 noted to have skin breakdown and erythema. This was especially in the left leg he was given doxycycline and Lasix. He was seen in their clinic again yesterday. Felt to have left leg  cellulitis. He was felt to possibly need debridement and he was referred here urgently. He is still on doxycycline. ABIs in this clinic were 0.89 on the right 0.97 on the left. Besides type 2 diabetes poorly controlled he has a history of proven gout, hyperlipidemia, hypertension and a history of bilateral cellulitis. He uses a walker to ambulate. 02/27/18; tolerated compression well. A lot of the areas have healed over. Still small open areas mostly on the right lateral and left posterior. I've elected to continue to dress this for another week. I would like him to come in next week with 20-30 mm below-knee stockings and we are setting him about elastic therapy to get these. I also emphasized skin lubrication. 03/06/18; everything is closed over here. He can be discharged in 20-30 mm below-knee stockings. He will need to lubricate the skin which is already cracked and fissuring. I've explained to him it is possible he will need more compression than this. We'll have to see how this goes over some time Readmission: 02-26-2023 upon evaluation today patient appears to be doing well currently all things considered in general other than the fact that he does appear to have some infection in regard to his lower extremity on the left leg. He does have evidence of bilateral lower extremity lymphedema but in particular I think that he has cellulitis at this site. Fortunately I do not see any evidence of active infection locally nor systemically which is great news. No fevers, chills, nausea, vomiting, or diarrhea. Upon inspection patient's past medical history really has not changed significantly since we last saw him. He really does not have any major medical problems other than the lymphedema and diabetes. Unfortunately I think he just became cellulitic secondary to a crack in the back of the lymphedematous tissue on the left  lower extremity that is what is causing the severe pain for him. He has had his  Lasix increased by his primary care provider to 40 mg twice a day. He also currently has a negative DVT by Doppler confirmed which was stated in the primary care note as well which was reviewed today. He is on Eliquis. HELIX, PATTERSON ([id number]) [id number] Page 2 of 7 03-05-2023 upon evaluation today patient unfortunately appears to be doing worse in regard to his wound. He has been tolerating the dressing changes without complication. Fortunately there does not appear to be any signs of active infection systemically though locally still has cellulitis. He has been taking the Bactrim as I prescribed last week unfortunately I think this is quite doing the job. 03-12-2023 upon evaluation today patient appears to be doing excellent in regard to his leg. This in fact is showing signs of healing quite nicely I do not see any evidence of infection locally or systemically which is great news and in general I do believe that removing in the right direction. Electronic Signature(s) Signed: 03/12/2023 4:49:07 PM By: Allen Derry PA-C Entered By: Allen Derry on 03/12/2023 16:49:07 -------------------------------------------------------------------------------- Physical Exam Details Patient Name: Date of Service: [name] 03/12/2023 2:45 PM Medical Record Number: [id number] Patient Account Number: [id number] Date of Birth/Sex: Treating RN: 09/15/1961 (62 y.o. M) Primary Care Provider: [name] Other Clinician: Referring Provider: Treating Provider/Extender: Cleone Slim in Treatment: 2 Constitutional Well-nourished and well-hydrated in no acute distress. Respiratory normal breathing without difficulty. Psychiatric this patient is able to make decisions and demonstrates good insight into disease process. Alert and Oriented x 3. pleasant and cooperative. Notes Upon inspection patient's wound bed actually showed signs of good granulation  epithelization he has very little open on the back of his leg is feeling much better and his pain is dramatically improved and overall I am extremely pleased with where things stand currently. I do think that we are on the right track here. The Cipro seems to have been a great addition to the care regimen. Electronic Signature(s) Signed: 03/12/2023 4:49:32 PM By: Allen Derry PA-C Entered By: Allen Derry on 03/12/2023 16:49:32 -------------------------------------------------------------------------------- Physician Orders Details Patient Name: Date of Service: CASTLE, DOUGALL 03/12/2023 2:45 PM Medical Record Number: [id number] Patient Account Number: [id number] Date of Birth/Sex: Treating RN: May 21, 1961 (62 y.o. Dianna Limbo Primary Care Provider: [name] Other Clinician: Referring Provider: Treating Provider/Extender: Cleone Slim in Treatment: 2 Verbal / Phone Orders: No Diagnosis Coding ICD-10 Coding Code Description E11.622 Type 2 diabetes mellitus with other skin ulcer I89.0 Lymphedema, not elsewhere classified L97.822 Non-pressure chronic ulcer of other part of left lower leg with fat layer exposed L03.116 Cellulitis of left lower limb KANO, LANCOUR ([id number]) [id number] Page 3 of 7 Follow-up Appointments ppointment in 1 week. Leonard Schwartz Wednesday Room 7 Return A 03/19/23 Wednesday Other: - Continue taking current antibiotics . If wound, left leg worsens in redness, pain, swelling, or fever go to the emergency department. Anesthetic (In clinic) Topical Lidocaine 4% applied to wound bed Bathing/ Shower/ Hygiene May shower and wash wound with soap and water. Edema Control - Lymphedema / SCD / Other Elevate legs to the level of the heart or above for 30 minutes daily and/or when sitting for 3-4 times a day throughout the day. Avoid standing for long periods of time. Exercise regularly Home Health Admit to Home  Health for skilled nursing wound care. May utilize formulary  equivalent dressing for wound treatment orders unless otherwise specified. New wound care orders this week; continue Home Health for wound care. May utilize formulary equivalent dressing for wound treatment orders unless otherwise specified. - home health to change Mondays and Fridays, wound center Wednesdays; home health to please closely monitor left leg if worsens please send patient to the emergency room. patient will start a second antibiotics today, totally two antibiotics. Other Home Health Orders/Instructions: [facility] Wound Treatment Wound #3 - Lower Leg Wound Laterality: Left, Posterior Cleanser: Soap and Water 3 x Per Week/30 Days Discharge Instructions: May shower and wash wound with dial antibacterial soap and water prior to dressing change. Peri-Wound Care: Zinc Oxide Ointment 30g tube 3 x Per Week/30 Days Discharge Instructions: Apply Zinc Oxide to periwound with each dressing change Peri-Wound Care: Sween Lotion (Moisturizing lotion) 3 x Per Week/30 Days Discharge Instructions: Apply moisturizing lotion as directed Prim Dressing: Maxorb Extra Ag+ Alginate Dressing, 4x4.75 (in/in) 3 x Per Week/30 Days ary Discharge Instructions: Apply to wound bed as instructed Secondary Dressing: ABD Pad, 8x10 3 x Per Week/30 Days Discharge Instructions: Apply over primary dressing as directed. Compression Wrap: FourPress (4 layer compression wrap) 3 x Per Week/30 Days Discharge Instructions: Apply four layer compression as directed. May also use Urgo K2 compression system as alternative. Compression Wrap: Urgo K2, two layer compression system, regular 3 x Per Week/30 Days Discharge Instructions: Or Apply Urgo K2 as directed (alternative to 4 layer compression). Compression Stockings: Circaid Juxta Lite Compression Wrap (DME) Left Leg Compression Amount: 30-40 mmHG Discharge Instructions: Apply Circaid Juxta Lite  Compression Wrap daily as instructed. Apply first thing in the morning, remove at night before bed. Electronic Signature(s) Signed: 03/13/2023 5:02:42 PM By: Karie Schwalbe RN Signed: 05/19/2023 3:13:10 PM By: Allen Derry PA-C Previous Signature: 03/12/2023 5:08:22 PM Version By: Karie Schwalbe RN Previous Signature: 03/12/2023 5:22:52 PM Version By: Allen Derry PA-C Entered By: Karie Schwalbe on 03/13/2023 16:56:52 -------------------------------------------------------------------------------- Problem List Details Patient Name: Date of Service: GLENDEN, [name] 03/12/2023 2:45 PM Medical Record Number: [id number] Patient Account Number: [id number] Date of Birth/Sex: Treating RN: [birth year] (62 y.o. M) Primary Care Provider: [name] Other Clinician: Referring Provider: Treating Provider/Extender: Marcelline Mates, Carren Rang in Treatment: 2 SAAMIR, SELINSKY ([id number]) [id number] Page 4 of 7 Active Problems ICD-10 Encounter Code Description Active Date MDM Diagnosis E11.622 Type 2 diabetes mellitus with other skin ulcer 02/26/2023 No Yes I89.0 Lymphedema, not elsewhere classified 02/26/2023 No Yes L97.822 Non-pressure chronic ulcer of other part of left lower leg with fat layer exposed4/08/2023 No Yes L03.116 Cellulitis of left lower limb 02/26/2023 No Yes Inactive Problems Resolved Problems Electronic Signature(s) Signed: 03/12/2023 3:07:54 PM By: Allen Derry PA-C Entered By: Allen Derry on 03/12/2023 15:07:52 -------------------------------------------------------------------------------- Progress Note Details Patient Name: Date of Service: [name] 03/12/2023 2:45 PM Medical Record Number: [id number] Patient Account Number: [id number] Date of Birth/Sex: Treating RN: 1961/05/19 (62 y.o. M) Primary Care Provider: [name] Other Clinician: Referring Provider: Treating Provider/Extender: Cleone Slim in  Treatment: 2 Subjective Chief Complaint Information obtained from Patient Left LE Ulcers with Bilateral Lymphedema History of Present Illness (HPI) 02/20/18; is a 62 year old man who is a poorly controlled type II diabetic with most recent hemoglobin A1c of 12.9 in January of this year. 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 He has not worn compression stockings. He is  a nonsmoker He tells [place] that he has a history of wounds on his bilateral lower legs. It is really unclear when this started although he thinks they were there at Christmas time 2018 therefore they are not new. He was seen in his primary doctor's office on 02/05/18 noted to have skin breakdown and erythema. This was especially in the left leg he was given doxycycline and Lasix. He was seen in their clinic again yesterday. Felt to have left leg cellulitis. He was felt to possibly need debridement and he was referred here urgently. He is still on doxycycline. ABIs in this clinic were 0.89 on the right 0.97 on the left. Besides type 2 diabetes poorly controlled he has a history of proven gout, hyperlipidemia, hypertension and a history of bilateral cellulitis. He uses a walker to ambulate. 02/27/18; tolerated compression well. A lot of the areas have healed over. Still small open areas mostly on the right lateral and left posterior. I've elected to continue to dress this for another week. I would like him to come in next week with 20-30 mm below-knee stockings and we are setting him about elastic therapy to get these. I also emphasized skin lubrication. 03/06/18; everything is closed over here. He can be discharged in 20-30 mm below-knee stockings. He will need to lubricate the skin which is already cracked and fissuring. I've explained to him it is possible he will need more compression than this. We'll have to see how this goes over some time Readmission: 02-26-2023 upon  evaluation today patient appears to be doing well currently all things considered in general other than the fact that he does appear to have some infection in regard to his lower extremity on the left leg. He does have evidence of bilateral lower extremity lymphedema but in particular I think that he has cellulitis at this site. Fortunately I do not see any evidence of active infection locally nor systemically which is great news. No fevers, chills, nausea, vomiting, or diarrhea. YANNI, BORRELL ([id number]) [id number] Page 5 of 7 Upon inspection patient's past medical history really has not changed significantly since we last saw him. He really does not have any major medical problems other than the lymphedema and diabetes. Unfortunately I think he just became cellulitic secondary to a crack in the back of the lymphedematous tissue on the left lower extremity that is what is causing the severe pain for him. He has had his Lasix increased by his primary care provider to 40 mg twice a day. He also currently has a negative DVT by Doppler confirmed which was stated in the primary care note as well which was reviewed today. He is on Eliquis. 03-05-2023 upon evaluation today patient unfortunately appears to be doing worse in regard to his wound. He has been tolerating the dressing changes without complication. Fortunately there does not appear to be any signs of active infection systemically though locally still has cellulitis. He has been taking the Bactrim as I prescribed last week unfortunately I think this is quite doing the job. 03-12-2023 upon evaluation today patient appears to be doing excellent in regard to his leg. This in fact is showing signs of healing quite nicely I do not see any evidence of infection locally or systemically which is great news and in general I do believe that removing in the right direction. Objective Constitutional Well-nourished and  well-hydrated in no acute distress. Vitals Time Taken: 3:04 PM, Height: 65 in, Weight: 240 lbs, BMI: 39.9, Pulse: 88  bpm, Respiratory Rate: 18 breaths/min, Blood Pressure: 138/88 mmHg, Capillary Blood Glucose: 177 mg/dl. Respiratory normal breathing without difficulty. Psychiatric this patient is able to make decisions and demonstrates good insight into disease process. Alert and Oriented x 3. pleasant and cooperative. General Notes: Upon inspection patient's wound bed actually showed signs of good granulation epithelization he has very little open on the back of his leg is feeling much better and his pain is dramatically improved and overall I am extremely pleased with where things stand currently. I do think that we are on the right track here. The Cipro seems to have been a great addition to the care regimen. Integumentary (Hair, Skin) Wound #3 status is Open. Original cause of wound was Gradually Appeared. The date acquired was: 01/18/2023. The wound has been in treatment 2 weeks. The wound is located on the Left,Posterior Lower Leg. The wound measures 2.5cm length x 1.5cm width x 0.1cm depth; 2.945cm^2 area and 0.295cm^3 volume. There is Fat Layer (Subcutaneous Tissue) exposed. There is no tunneling or undermining noted. There is a medium amount of serosanguineous drainage noted. The wound margin is epibole. There is small (1-33%) pink granulation within the wound bed. There is a large (67-100%) amount of necrotic tissue within the wound bed including Eschar and Adherent Slough. The periwound skin appearance exhibited: Dry/Scaly, Hemosiderin Staining, Erythema. The periwound skin appearance did not exhibit: Callus, Crepitus, Excoriation, Induration, Rash, Scarring, Maceration, Atrophie Blanche, Cyanosis, Ecchymosis, Mottled, Pallor, Rubor. The surrounding wound skin color is noted with erythema which is circumferential. Periwound temperature was noted as No Abnormality. Assessment Active  Problems ICD-10 Type 2 diabetes mellitus with other skin ulcer Lymphedema, not elsewhere classified Non-pressure chronic ulcer of other part of left lower leg with fat layer exposed Cellulitis of left lower limb Procedures Wound #3 Pre-procedure diagnosis of Wound #3 is a Diabetic Wound/Ulcer of the Lower Extremity located on the Left,Posterior Lower Leg . There was a Four Layer Compression Therapy Procedure by Karie Schwalbe, RN. Post procedure Diagnosis Wound #3: Same as Pre-Procedure Notes: Or URGO K2. Plan Follow-up Appointments: Return Appointment in 1 week. Leonard Schwartz Wednesday Room 7 03/19/23 Wednesday Other: - Continue taking current antibiotics . If wound, left leg worsens in redness, pain, swelling, or fever go to the emergency department. Anesthetic: (In clinic) Topical Lidocaine 4% applied to wound bed Bathing/ Shower/ Hygiene: ELDREDGE, KLUTTZ ([id number]) [id number] Page 6 of 7 May shower and wash wound with soap and water. Edema Control - Lymphedema / SCD / Other: Elevate legs to the level of the heart or above for 30 minutes daily and/or when sitting for 3-4 times a day throughout the day. Avoid standing for long periods of time. Exercise regularly Home Health: Admit to Home Health for skilled nursing wound care. May utilize formulary equivalent dressing for wound treatment orders unless otherwise specified. New wound care orders this week; continue Home Health for wound care. May utilize formulary equivalent dressing for wound treatment orders unless otherwise specified. - home health to change Mondays and Fridays, wound center Wednesdays; home health to please closely monitor left leg if worsens please send patient to the emergency room. patient will start a second antibiotics today, totally two antibiotics. Other Home Health Orders/Instructions: [facility] WOUND #3: - Lower Leg Wound Laterality: Left, Posterior Cleanser: Soap and  Water 3 x Per Week/30 Days Discharge Instructions: May shower and wash wound with dial antibacterial soap and water prior to dressing change. Peri-Wound Care: Zinc Oxide Ointment 30g tube  3 x Per Week/30 Days Discharge Instructions: Apply Zinc Oxide to periwound with each dressing change Peri-Wound Care: Sween Lotion (Moisturizing lotion) 3 x Per Week/30 Days Discharge Instructions: Apply moisturizing lotion as directed Prim Dressing: Maxorb Extra Ag+ Alginate Dressing, 4x4.75 (in/in) 3 x Per Week/30 Days ary Discharge Instructions: Apply to wound bed as instructed Secondary Dressing: ABD Pad, 8x10 3 x Per Week/30 Days Discharge Instructions: Apply over primary dressing as directed. Com pression Wrap: FourPress (4 layer compression wrap) 3 x Per Week/30 Days Discharge Instructions: Apply four layer compression as directed. May also use Urgo K2 compression system as alternative. Com pression Wrap: Urgo K2, two layer compression system, regular 3 x Per Week/30 Days Discharge Instructions: Or Apply Urgo K2 as directed (alternative to 4 layer compression). Com pression Stockings: Circaid Juxta Lite Compression Wrap (DME) Compression Amount: 30-40 mmHg (left) Discharge Instructions: Apply Circaid Juxta Lite Compression Wrap daily as instructed. Apply first thing in the morning, remove at night before bed. 1. I would recommend currently that we have the patient continue to monitor for any signs of infection or worsening. Based on what I am seeing I do believe that he is doing well I think the compression wrap is doing a really good job. 2. I am going to recommend that we should continue to utilize the zinc to protect the skin and subsequently we are also utilizing the 4-layer compression wrap at this point to try to get more the edema out of the leg I think more we can do the better. 3. I am also going to suggest that we go ahead and see about ordering a juxta light compression wrap I think this is  good to be of utmost importance for long- term keeping this from reopening on him yet again. We will see patient back for reevaluation in 1 week here in the clinic. If anything worsens or changes patient will contact our office for additional recommendations. Electronic Signature(s) Signed: 03/17/2023 5:00:20 PM By: Shawn Stall RN, BSN Signed: 05/19/2023 3:13:10 PM By: Allen Derry PA-C Previous Signature: 03/12/2023 4:50:42 PM Version By: Allen Derry PA-C Entered By: Shawn Stall on 03/17/2023 16:59:16 -------------------------------------------------------------------------------- SuperBill Details Patient Name: Date of Service: LONAN, [name] 03/12/2023 Medical Record Number: [id number] Patient Account Number: [id number] Date of Birth/Sex: Treating RN: 08-Nov-1961 (62 y.o. Dianna Limbo Primary Care Provider: [name] Other Clinician: Referring Provider: Treating Provider/Extender: Cleone Slim in Treatment: 2 Diagnosis Coding ICD-10 Codes Code Description [phone] Type 2 diabetes mellitus with other skin ulcer I89.0 Lymphedema, not elsewhere classified L97.822 Non-pressure chronic ulcer of other part of left lower leg with fat layer exposed L03.116 Cellulitis of left lower limb Facility Procedures : TYSIR, WOODY Code: [id number] RNEST S ([id number]) I Description: (Facility Use Only) [phone] - APPLY MULTLAY COMPRS LWR LT LEG ICD-10 Diagnosis Description [id number] 89.0 Lymphedema, not elsewhere classified Modifier: [id number] Pa Quantity: 1 ge 7 of 7 Physician Procedures : CPT4 Code Description Modifier [phone] [id number] - WC PHYS LEVEL 3 - EST PT ICD-10 Diagnosis Description E11.622 Type 2 diabetes mellitus with other skin ulcer I89.0 Lymphedema, not elsewhere classified L97.822 Non-pressure chronic ulcer of other part of  left lower leg with fat layer exposed L03.116 Cellulitis of left lower limb Quantity: 1 Electronic  Signature(s) Signed: 03/12/2023 4:51:07 PM By: Allen Derry PA-C Entered By: Allen Derry on 03/12/2023 16:51:06

## 2023-03-13 NOTE — Patient Instructions (Signed)
Visit Information  Thank you for taking time to visit with me today. Please don't hesitate to contact me if I can be of assistance to you.   Following are the goals we discussed today:   Goals Addressed             This Visit's Progress    Transportation       Goals:       Glad you were [facility] an and going back weekly- Keep focus healing wound and following instructions provided by MD Expect a packet in mail from [facility] for how to use SCAT now that you have been approved through 02/2026. Call your insurance provider for more information about your Enhanced Benefits (is life alert covered?) Make sure to order from [facility] catalog before end of month for this quarterly credit to shop                    Our next appointment is by telephone on 03/17/23  Please call the care guide team at [phone] if you need to cancel or reschedule your appointment.   If you are experiencing a Mental Health or Behavioral Health Crisis or need someone to talk to, please call the Suicide and Crisis Lifeline: 988 call 911   The patient verbalized understanding of instructions, educational materials, and care plan provided today and DECLINED offer to receive copy of patient instructions, educational materials, and care plan.   Telephone follow up appointment with care management team member scheduled for: 03/17/23 Reece Levy, MSW, LCSW Clinical Social Worker [employer] [employer] [phone]

## 2023-03-13 NOTE — Progress Notes (Signed)
[name], [name] ([id number]) [id number] Page 1 of 6 Visit Report for 03/12/2023 Arrival Information Details Patient Name: Date of Service: [name], [name] 03/12/2023 2:45 PM Medical Record Number: [id number] Patient Account Number: [id number] Date of Birth/Sex: Treating RN: 09-30-1961 (62 y.o. M) Primary Care Kirrah Mustin: [name] Other Clinician: Referring Raschelle Wisenbaker: Treating Ladesha Pacini/Extender: Cleone Slim in Treatment: 2 Visit Information History Since Last Visit Added or deleted any medications: No Patient Arrived: [name] Any new allergies or adverse reactions: No Arrival Time: 15:04 Had a fall or experienced change in No Accompanied By: self activities of daily living that may affect Transfer Assistance: None risk of falls: Signs or symptoms of abuse/neglect since last visito No Hospitalized since last visit: No Implantable device outside of the clinic excluding No cellular tissue based products placed in the center since last visit: Has Dressing in Place as Prescribed: Yes Has Compression in Place as Prescribed: Yes Pain Present Now: No Electronic Signature(s) Signed: 03/12/2023 4:04:07 PM By: Thayer Dallas Entered By: Thayer Dallas on 03/12/2023 15:04:51 -------------------------------------------------------------------------------- Compression Therapy Details Patient Name: Date of Service: [name] 03/12/2023 2:45 PM Medical Record Number: [id number] Patient Account Number: [id number] Date of Birth/Sex: Treating RN: 1961/11/04 (62 y.o. Dianna Limbo Primary Care Arryanna Holquin: [name] Other Clinician: Referring Aziah Kaiser: Treating Lakela Kuba/Extender: Cleone Slim in Treatment: 2 Compression Therapy Performed for Wound Assessment: Wound #3 Left,Posterior Lower Leg Performed By: Clinician Karie Schwalbe, RN Compression Type: Four Layer Post Procedure Diagnosis Same as  Pre-procedure Notes Or Harlene Salts Electronic Signature(s) Signed: 03/12/2023 5:08:22 PM By: Karie Schwalbe RN Entered By: Karie Schwalbe on 03/12/2023 15:59:18 -------------------------------------------------------------------------------- Encounter Discharge Information Details Patient Name: Date of Service: [name] 03/12/2023 2:45 PM Medical Record Number: [id number] Patient Account Number: [id number] Date of Birth/Sex: Treating RN: 07-19-1961 (62 y.o. Dianna Limbo Primary Care Kathlyne Loud: [name] Other Clinician: Referring Kathryn Linarez: Treating Shamari Trostel/Extender: Cleone Slim in Treatment: 2 Encounter Discharge Information Items Discharge Condition: [name], [name] ([id number]) [id number] Page 2 of 6 Ambulatory Status: Cane Discharge Destination: Home Transportation: Private Auto Accompanied By: self Schedule Follow-up Appointment: Yes Clinical Summary of Care: Patient Declined Electronic Signature(s) Signed: 03/12/2023 5:08:22 PM By: Karie Schwalbe RN Entered By: Karie Schwalbe on 03/12/2023 16:00:45 -------------------------------------------------------------------------------- Lower Extremity Assessment Details Patient Name: Date of Service: [name], [name] 03/12/2023 2:45 PM Medical Record Number: [id number] Patient Account Number: [id number] Date of Birth/Sex: Treating RN: 1961/09/21 (62 y.o. M) Primary Care Jarod Bozzo: [name] Other Clinician: Referring Amerigo Mcglory: Treating Violeta Lecount/Extender: Lolita Patella Weeks in Treatment: 2 Edema Assessment Assessed: [Left: No] [Right: No] Edema: [Left: Ye] [Right: s] Calf Left: Right: Point of Measurement: 30 cm From Medial Instep 47.5 cm Ankle Left: Right: Point of Measurement: 10 cm From Medial Instep 33.5 cm Knee To Floor Left: Right: From Medial Instep 42 cm Vascular Assessment Pulses: Dorsalis Pedis Palpable:  [Left:Yes] Electronic Signature(s) Signed: 03/12/2023 5:08:22 PM By: Karie Schwalbe RN Entered By: Karie Schwalbe on 03/12/2023 15:30:44 -------------------------------------------------------------------------------- Multi-Disciplinary Care Plan Details Patient Name: Date of Service: [name] 03/12/2023 2:45 PM Medical Record Number: [id number] Patient Account Number: [id number] Date of Birth/Sex: Treating RN: 1961-01-25 (62 y.o. Dianna Limbo Primary Care Man Effertz: [name] Other Clinician: Referring Natalyah Cummiskey: Treating Benyamin Jeff/Extender: Lolita Patella Weeks in Treatment: 2 Active Inactive Pain, Acute or Chronic Nursing Diagnoses: Pain, acute or chronic: actual or potential Potential alteration in comfort, pain CHAY, MAZZONI ([id number]) [phone].pdf Page  3 of 6 Goals: Patient will verbalize adequate pain control and receive pain control interventions during procedures as needed Date Initiated: 02/26/2023 Target Resolution Date: 05/28/2023 Goal Status: Active Patient/caregiver will verbalize comfort level met Date Initiated: 02/26/2023 Target Resolution Date: 05/28/2023 Goal Status: Active Interventions: Encourage patient to take pain medications as prescribed Provide education on pain management Reposition patient for comfort Treatment Activities: Administer pain control measures as ordered : 02/26/2023 Notes: Wound/Skin Impairment Nursing Diagnoses: Knowledge deficit related to ulceration/compromised skin integrity Goals: Patient/caregiver will verbalize understanding of skin care regimen Date Initiated: 02/26/2023 Target Resolution Date: 05/28/2023 Goal Status: Active Interventions: Assess patient/caregiver ability to perform ulcer/skin care regimen upon admission and as needed Assess ulceration(s) every visit Provide education on ulcer and skin care Treatment Activities: Skin care regimen initiated :  02/26/2023 Topical wound management initiated : 02/26/2023 Notes: Electronic Signature(s) Signed: 03/12/2023 5:08:22 PM By: Karie Schwalbe RN Entered By: Karie Schwalbe on 03/12/2023 15:58:00 -------------------------------------------------------------------------------- Pain Assessment Details Patient Name: Date of Service: [name], [name] 03/12/2023 2:45 PM Medical Record Number: [id number] Patient Account Number: [id number] Date of Birth/Sex: Treating RN: 1960-11-25 (62 y.o. M) Primary Care Treylen Gibbs: [name] Other Clinician: Referring Piotr Christopher: Treating Kalen Neidert/Extender: Cleone Slim in Treatment: 2 Active Problems Location of Pain Severity and Description of Pain Patient Has Paino No Site Locations [place], Dougherty S ([id number]) [id number] Page 4 of 6 Pain Management and Medication Current Pain Management: Electronic Signature(s) Signed: 03/12/2023 4:04:07 PM By: Thayer Dallas Entered By: Thayer Dallas on 03/12/2023 15:06:25 -------------------------------------------------------------------------------- Patient/Caregiver Education Details Patient Name: Date of Service: [name] 4/24/2024andnbsp2:45 PM Medical Record Number: [id number] Patient Account Number: [id number] Date of Birth/Gender: Treating RN: [birth year] (62 y.o. Dianna Limbo Primary Care Physician: [name] Other Clinician: Referring Physician: Treating Physician/Extender: Cleone Slim in Treatment: 2 Education Assessment Education Provided To: Patient Education Topics Provided Wound/Skin Impairment: Methods: Explain/Verbal Responses: Return demonstration correctly Electronic Signature(s) Signed: 03/12/2023 5:08:22 PM By: Karie Schwalbe RN Entered By: Karie Schwalbe on 03/12/2023 15:58:20 -------------------------------------------------------------------------------- Wound Assessment Details Patient  Name: Date of Service: [name], [name] 03/12/2023 2:45 PM Medical Record Number: [id number] Patient Account Number: [id number] Date of Birth/Sex: Treating RN: 1961-03-28 (62 y.o. M) Primary Care Sadonna Kotara: [name] Other Clinician: Referring Jay Haskew: Treating Raima Geathers/Extender: Lolita Patella Weeks in Treatment: 2 Wound Status Wound Number: 3 Primary Etiology: Diabetic Wound/Ulcer of the Lower Extremity Wound Location: Left, Posterior Lower Leg Secondary Etiology: Lymphedema Wounding Event: Gradually Appeared Wound Status: Open Date Acquired: 01/18/2023 Comorbid History: Hypertension, Type II Diabetes, Gout Weeks Of Treatment: 2 Clustered Wound: No [name], [name] ([id number]) [id number] Page 5 of 6 Photos Wound Measurements Length: (cm) 2.5 Width: (cm) 1.5 Depth: (cm) 0.1 Area: (cm) 2.945 Volume: (cm) 0.295 % Reduction in Area: 80.5% % Reduction in Volume: 80.5% Epithelialization: Small (1-33%) Tunneling: No Undermining: No Wound Description Classification: Grade 1 Wound Margin: Epibole Exudate Amount: Medium Exudate Type: Serosanguineous Exudate Color: red, brown Foul Odor After Cleansing: No Slough/Fibrino Yes Wound Bed Granulation Amount: Small (1-33%) Exposed Structure Granulation Quality: Pink Fascia Exposed: No Necrotic Amount: Large (67-100%) Fat Layer (Subcutaneous Tissue) Exposed: Yes Necrotic Quality: Eschar, Adherent Slough Tendon Exposed: No Muscle Exposed: No Joint Exposed: No Bone Exposed: No Periwound Skin Texture Texture Color No Abnormalities Noted: No No Abnormalities Noted: No Callus: No Atrophie Blanche: No Crepitus: No Cyanosis: No Excoriation: No Ecchymosis: No Induration: No Erythema: Yes Rash: No Erythema Location: Circumferential Scarring: No Hemosiderin Staining: Yes Mottled: No  Moisture Pallor: No No Abnormalities Noted: No Rubor: No Dry / Scaly: Yes Maceration: No  Temperature / Pain Temperature: No Abnormality Treatment Notes Wound #3 (Lower Leg) Wound Laterality: Left, Posterior Cleanser Soap and Water Discharge Instruction: May shower and wash wound with dial antibacterial soap and water prior to dressing change. Peri-Wound Care Zinc Oxide Ointment 30g tube Discharge Instruction: Apply Zinc Oxide to periwound with each dressing change Sween Lotion (Moisturizing lotion) Discharge Instruction: Apply moisturizing lotion as directed Topical Primary Dressing Maxorb Extra Ag+ Alginate Dressing, 4x4.75 (in/in) Discharge Instruction: Apply to wound bed as instructed [name], [name] ([id number]) [id number] Page 6 of 6 Secondary Dressing ABD Pad, 8x10 Discharge Instruction: Apply over primary dressing as directed. Secured With Compression Wrap FourPress (4 layer compression wrap) Discharge Instruction: Apply four layer compression as directed. May also use Urgo K2 compression system as alternative. Urgo K2, two layer compression system, regular Discharge Instruction: Or Apply Urgo K2 as directed (alternative to 4 layer compression). Compression Stockings Circaid Juxta Lite Compression Wrap Quantity: 1 Left Leg Compression Amount: 30-40 mmHg Discharge Instruction: Apply Circaid Juxta Lite Compression Wrap daily as instructed. Apply first thing in the morning, remove at night before bed. Add-Ons Electronic Signature(s) Signed: 03/12/2023 5:08:22 PM By: Karie Schwalbe RN Entered By: Karie Schwalbe on 03/12/2023 15:16:11 -------------------------------------------------------------------------------- Vitals Details Patient Name: Date of Service: [name] 03/12/2023 2:45 PM Medical Record Number: [id number] Patient Account Number: [id number] Date of Birth/Sex: Treating RN: September 21, 1961 (62 y.o. M) Primary Care Sahara Fujimoto: [name] Other Clinician: Referring Rawn Quiroa: Treating Annaliyah Willig/Extender: Cleone Slim in Treatment: 2 Vital Signs Time Taken: 15:04 Pulse (bpm): 88 Height (in): 65 Respiratory Rate (breaths/min): 18 Weight (lbs): 240 Blood Pressure (mmHg): 138/88 Body Mass Index (BMI): 39.9 Capillary Blood Glucose (mg/dl): 643 Reference Range: 80 - 120 mg / dl Electronic Signature(s) Signed: 03/12/2023 4:04:07 PM By: Thayer Dallas Entered By: Thayer Dallas on 03/12/2023 15:06:01

## 2023-03-14 ENCOUNTER — Ambulatory Visit: Payer: Self-pay

## 2023-03-14 DIAGNOSIS — I10 Essential (primary) hypertension: Secondary | ICD-10-CM | POA: Diagnosis not present

## 2023-03-14 DIAGNOSIS — L03116 Cellulitis of left lower limb: Secondary | ICD-10-CM | POA: Diagnosis not present

## 2023-03-14 DIAGNOSIS — E11622 Type 2 diabetes mellitus with other skin ulcer: Secondary | ICD-10-CM | POA: Diagnosis not present

## 2023-03-14 DIAGNOSIS — E1140 Type 2 diabetes mellitus with diabetic neuropathy, unspecified: Secondary | ICD-10-CM | POA: Diagnosis not present

## 2023-03-14 DIAGNOSIS — M79606 Pain in leg, unspecified: Secondary | ICD-10-CM | POA: Diagnosis not present

## 2023-03-14 DIAGNOSIS — L0390 Cellulitis, unspecified: Secondary | ICD-10-CM | POA: Diagnosis not present

## 2023-03-14 DIAGNOSIS — L97822 Non-pressure chronic ulcer of other part of left lower leg with fat layer exposed: Secondary | ICD-10-CM | POA: Diagnosis not present

## 2023-03-14 DIAGNOSIS — F70 Mild intellectual disabilities: Secondary | ICD-10-CM | POA: Diagnosis not present

## 2023-03-14 DIAGNOSIS — I890 Lymphedema, not elsewhere classified: Secondary | ICD-10-CM | POA: Diagnosis not present

## 2023-03-14 DIAGNOSIS — I872 Venous insufficiency (chronic) (peripheral): Secondary | ICD-10-CM | POA: Diagnosis not present

## 2023-03-14 DIAGNOSIS — I2510 Atherosclerotic heart disease of native coronary artery without angina pectoris: Secondary | ICD-10-CM | POA: Diagnosis not present

## 2023-03-14 DIAGNOSIS — E1151 Type 2 diabetes mellitus with diabetic peripheral angiopathy without gangrene: Secondary | ICD-10-CM | POA: Diagnosis not present

## 2023-03-14 DIAGNOSIS — M7989 Other specified soft tissue disorders: Secondary | ICD-10-CM | POA: Diagnosis not present

## 2023-03-14 NOTE — Patient Outreach (Signed)
  Care Coordination   03/14/2023 Name: [name] MRN: [id number] DOB: 1961-02-18   Care Coordination Outreach Attempts:  An unsuccessful telephone outreach was attempted today to offer the patient information about available care coordination services as a benefit of their health plan.   Follow Up Plan:  Additional outreach attempts will be made to offer the patient care coordination information and services.   Encounter Outcome:  No Answer   Care Coordination Interventions:  No, not indicated    Bary Leriche, RN, MSN [facility] Care Management Care Management Coordinator Direct Line [phone]

## 2023-03-17 ENCOUNTER — Ambulatory Visit: Payer: Self-pay | Admitting: *Deleted

## 2023-03-17 DIAGNOSIS — I2510 Atherosclerotic heart disease of native coronary artery without angina pectoris: Secondary | ICD-10-CM | POA: Diagnosis not present

## 2023-03-17 DIAGNOSIS — F70 Mild intellectual disabilities: Secondary | ICD-10-CM | POA: Diagnosis not present

## 2023-03-17 DIAGNOSIS — I10 Essential (primary) hypertension: Secondary | ICD-10-CM | POA: Diagnosis not present

## 2023-03-17 DIAGNOSIS — E7800 Pure hypercholesterolemia, unspecified: Secondary | ICD-10-CM | POA: Diagnosis not present

## 2023-03-17 DIAGNOSIS — I872 Venous insufficiency (chronic) (peripheral): Secondary | ICD-10-CM | POA: Diagnosis not present

## 2023-03-17 DIAGNOSIS — M7989 Other specified soft tissue disorders: Secondary | ICD-10-CM | POA: Diagnosis not present

## 2023-03-17 DIAGNOSIS — E1122 Type 2 diabetes mellitus with diabetic chronic kidney disease: Secondary | ICD-10-CM | POA: Diagnosis not present

## 2023-03-17 DIAGNOSIS — M79606 Pain in leg, unspecified: Secondary | ICD-10-CM | POA: Diagnosis not present

## 2023-03-17 DIAGNOSIS — Z125 Encounter for screening for malignant neoplasm of prostate: Secondary | ICD-10-CM | POA: Diagnosis not present

## 2023-03-17 DIAGNOSIS — E1140 Type 2 diabetes mellitus with diabetic neuropathy, unspecified: Secondary | ICD-10-CM | POA: Diagnosis not present

## 2023-03-17 DIAGNOSIS — K219 Gastro-esophageal reflux disease without esophagitis: Secondary | ICD-10-CM | POA: Diagnosis not present

## 2023-03-17 DIAGNOSIS — L0390 Cellulitis, unspecified: Secondary | ICD-10-CM | POA: Diagnosis not present

## 2023-03-17 DIAGNOSIS — E1151 Type 2 diabetes mellitus with diabetic peripheral angiopathy without gangrene: Secondary | ICD-10-CM | POA: Diagnosis not present

## 2023-03-17 DIAGNOSIS — M109 Gout, unspecified: Secondary | ICD-10-CM | POA: Diagnosis not present

## 2023-03-17 NOTE — Patient Outreach (Signed)
Pt has not received the GTA/SCAT packet as of yet- pt will call CSW once received for further assistance with next steps.   Reece Levy, MSW, LCSW Clinical Social Worker [employer] [phone]

## 2023-03-19 ENCOUNTER — Encounter (HOSPITAL_BASED_OUTPATIENT_CLINIC_OR_DEPARTMENT_OTHER): Payer: Medicare HMO | Attending: Physician Assistant | Admitting: Physician Assistant

## 2023-03-19 DIAGNOSIS — I890 Lymphedema, not elsewhere classified: Secondary | ICD-10-CM | POA: Insufficient documentation

## 2023-03-19 DIAGNOSIS — I10 Essential (primary) hypertension: Secondary | ICD-10-CM | POA: Insufficient documentation

## 2023-03-19 DIAGNOSIS — Z7901 Long term (current) use of anticoagulants: Secondary | ICD-10-CM | POA: Insufficient documentation

## 2023-03-19 DIAGNOSIS — E785 Hyperlipidemia, unspecified: Secondary | ICD-10-CM | POA: Insufficient documentation

## 2023-03-19 DIAGNOSIS — L97822 Non-pressure chronic ulcer of other part of left lower leg with fat layer exposed: Secondary | ICD-10-CM | POA: Insufficient documentation

## 2023-03-19 DIAGNOSIS — E1165 Type 2 diabetes mellitus with hyperglycemia: Secondary | ICD-10-CM | POA: Diagnosis not present

## 2023-03-19 DIAGNOSIS — E11622 Type 2 diabetes mellitus with other skin ulcer: Secondary | ICD-10-CM | POA: Diagnosis not present

## 2023-03-19 DIAGNOSIS — L03116 Cellulitis of left lower limb: Secondary | ICD-10-CM | POA: Diagnosis not present

## 2023-03-19 DIAGNOSIS — L97222 Non-pressure chronic ulcer of left calf with fat layer exposed: Secondary | ICD-10-CM | POA: Diagnosis not present

## 2023-03-19 DIAGNOSIS — M109 Gout, unspecified: Secondary | ICD-10-CM | POA: Diagnosis not present

## 2023-03-20 NOTE — Progress Notes (Signed)
HUMPHREY, GUERREIRO ([id number]) [id number] Page 1 of 6 Visit Report for 03/19/2023 Chief Complaint Document Details Patient Name: Date of Service: [name], [name] 03/19/2023 2:00 PM Medical Record Number: [id number] Patient Account Number: [id number] Date of Birth/Sex: Treating RN: 1961-08-19 (62 y.o. M) Primary Care Provider: [name] Other Clinician: Referring Provider: Treating Provider/Extender: Cleone Slim in Treatment: 3 Information Obtained from: Patient Chief Complaint Left LE Ulcers with Bilateral Lymphedema Electronic Signature(s) Signed: 03/19/2023 1:42:12 PM By: Allen Derry PA-C Entered By: Allen Derry on 03/19/2023 13:42:12 -------------------------------------------------------------------------------- HPI Details Patient Name: Date of Service: [name]. 03/19/2023 2:00 PM Medical Record Number: [id number] Patient Account Number: [id number] Date of Birth/Sex: Treating RN: Mar 23, 1961 (62 y.o. M) Primary Care Provider: [name] Other Clinician: Referring Provider: Treating Provider/Extender: Cleone Slim in Treatment: 3 History of Present Illness HPI Description: 02/20/18; is a 62 year old man who is a poorly controlled type II diabetic with most recent hemoglobin A1c of 12.9 in January of this year. 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 He has not worn compression stockings. He is a nonsmoker He tells [place] that he has a history of wounds on his bilateral lower legs. It is really unclear when this started although he thinks they were there at Christmas time 2018 therefore they are not new. He was seen in his primary doctor's office on 02/05/18 noted to have skin breakdown and erythema. This was especially in the left leg he was given doxycycline and Lasix. He was seen in their clinic again yesterday. Felt to have left leg  cellulitis. He was felt to possibly need debridement and he was referred here urgently. He is still on doxycycline. ABIs in this clinic were 0.89 on the right 0.97 on the left. Besides type 2 diabetes poorly controlled he has a history of proven gout, hyperlipidemia, hypertension and a history of bilateral cellulitis. He uses a walker to ambulate. 02/27/18; tolerated compression well. A lot of the areas have healed over. Still small open areas mostly on the right lateral and left posterior. I've elected to continue to dress this for another week. I would like him to come in next week with 20-30 mm below-knee stockings and we are setting him about elastic therapy to get these. I also emphasized skin lubrication. 03/06/18; everything is closed over here. He can be discharged in 20-30 mm below-knee stockings. He will need to lubricate the skin which is already cracked and fissuring. I've explained to him it is possible he will need more compression than this. We'll have to see how this goes over some time Readmission: 02-26-2023 upon evaluation today patient appears to be doing well currently all things considered in general other than the fact that he does appear to have some infection in regard to his lower extremity on the left leg. He does have evidence of bilateral lower extremity lymphedema but in particular I think that he has cellulitis at this site. Fortunately I do not see any evidence of active infection locally nor systemically which is great news. No fevers, chills, nausea, vomiting, or diarrhea. Upon inspection patient's past medical history really has not changed significantly since we last saw him. He really does not have any major medical problems other than the lymphedema and diabetes. Unfortunately I think he just became cellulitic secondary to a crack in the back of the lymphedematous tissue on the left  lower extremity that is what is causing the severe pain for him. He has had his  Lasix increased by his primary care provider to 40 mg twice a day. He also currently has a negative DVT by Doppler confirmed which was stated in the primary care note as well which was reviewed today. He is on Eliquis. 03-05-2023 upon evaluation today patient unfortunately appears to be doing worse in regard to his wound. He has been tolerating the dressing changes without complication. Fortunately there does not appear to be any signs of active infection systemically though locally still has cellulitis. He has been taking the Bactrim as I prescribed last week unfortunately I think this is quite doing the job. 03-12-2023 upon evaluation today patient appears to be doing excellent in regard to his leg. This in fact is showing signs of healing quite nicely I do not see any evidence of infection locally or systemically which is great news and in general I do believe that removing in the right direction. 03-19-2023 upon evaluation today patient appears to be doing poorly currently in regard to his wound. Unfortunately despite the Bactrim and Cipro he has continued to have significant issues. Again we added the Cipro due to the blue-green drainage that was noted and again we had him continue the Bactrim. Nonetheless I have not really been able to get a good culture on this which I think is the issue. I do believe that he is tolerating the dressing changes as far as the compression wrapping there is nothing open but he does seem to have cellulitis still noted. [name], [name] ([id number]) [id number] Page 2 of 6 Electronic Signature(s) Signed: 03/19/2023 3:14:23 PM By: Allen Derry PA-C Entered By: Allen Derry on 03/19/2023 15:14:23 -------------------------------------------------------------------------------- Physical Exam Details Patient Name: Date of Service: [name], [name] 03/19/2023 2:00 PM Medical Record Number: [id number] Patient Account Number: [id number] Date of Birth/Sex:  Treating RN: 1961-11-02 (62 y.o. M) Primary Care Provider: [name] Other Clinician: Referring Provider: Treating Provider/Extender: Cleone Slim in Treatment: 3 Constitutional Chronically ill appearing but in no apparent acute distress. Respiratory normal breathing without difficulty. Psychiatric this patient is able to make decisions and demonstrates good insight into disease process. Alert and Oriented x 3. pleasant and cooperative. Notes Upon inspection patient's wound bed actually showed signs of excellent progress epithelization and granulation in fact everything is completely closed this is great news. Unfortunately he continues to have issues nonetheless with what appears to be cellulitis and erythema especially toward the lateral and posterior portion of his leg. This is causing him significant pain I can tell he does not feel well. Electronic Signature(s) Signed: 03/19/2023 3:14:46 PM By: Allen Derry PA-C Entered By: Allen Derry on 03/19/2023 15:14:46 -------------------------------------------------------------------------------- Physician Orders Details Patient Name: Date of Service: [name], [name] 03/19/2023 2:00 PM Medical Record Number: [id number] Patient Account Number: [id number] Date of Birth/Sex: Treating RN: 1961/04/10 (62 y.o. Charlean Merl, Lauren Primary Care Provider: [name] Other Clinician: Referring Provider: Treating Provider/Extender: Cleone Slim in Treatment: 3 Verbal / Phone Orders: No Diagnosis Coding ICD-10 Coding Code Description E11.622 Type 2 diabetes mellitus with other skin ulcer I89.0 Lymphedema, not elsewhere classified L97.822 Non-pressure chronic ulcer of other part of left lower leg with fat layer exposed L03.116 Cellulitis of left lower limb Follow-up Appointments ppointment in 1 week. - w/ Allen Derry and Joanne Wednesday Rm # 9 03/26/23 @ 12:30 Return A Other: - T new  antibiotics Riki Altes) . If  wound, left leg worsens in redness, pain, swelling, or fever go to the emergency department. ake Anesthetic (In clinic) Topical Lidocaine 4% applied to wound bed Bathing/ Shower/ Hygiene May shower and wash wound with soap and water. Edema Control - Lymphedema / SCD / Other Elevate legs to the level of the heart or above for 30 minutes daily and/or when sitting for 3-4 times a day throughout the day. Avoid standing for long periods of time. Exercise regularly [facility] DRAEDEN, KELLMAN S ([id number]) [id number] Page 3 of 6 Admit to Home Health for skilled nursing wound care. May utilize formulary equivalent dressing for wound treatment orders unless otherwise specified. New wound care orders this week; continue Home Health for wound care. May utilize formulary equivalent dressing for wound treatment orders unless otherwise specified. - home health to change Mondays and Fridays, wound center Wednesdays; home health to please closely monitor left leg if worsens please send patient to the emergency room. patient will start a second antibiotics today, totally two antibiotics. Other Home Health Orders/Instructions: [facility] Wound Treatment Wound #3 - Lower Leg Wound Laterality: Left, Posterior Cleanser: Soap and Water 3 x Per Week/30 Days Discharge Instructions: May shower and wash wound with dial antibacterial soap and water prior to dressing change. Peri-Wound Care: Zinc Oxide Ointment 30g tube 3 x Per Week/30 Days Discharge Instructions: Apply Zinc Oxide to periwound with each dressing change Peri-Wound Care: Sween Lotion (Moisturizing lotion) 3 x Per Week/30 Days Discharge Instructions: Apply moisturizing lotion as directed Compression Wrap: Urgo K2 Lite, two layer compression system, regular 3 x Per Week/30 Days Discharge Instructions: Apply Urgo K2 Lite as directed (alternative to 3 layer compression). Compression  Stockings: Circaid Juxta Lite Compression Wrap (DME) Left Leg Compression Amount: 30-40 mmHG Discharge Instructions: Apply Circaid Juxta Lite Compression Wrap daily as instructed. Apply first thing in the morning, remove at night before bed. Patient Medications llergies: No Known Allergies A Notifications Medication Indication Start End 03/19/2023 Nuzyra DOSE 2 - oral 150 mg tablet - 2 tablet oral once daily at bedtime. Wait 2 hours after eating before taking this medication Electronic Signature(s) Signed: 03/19/2023 3:17:06 PM By: Allen Derry PA-C Entered By: Allen Derry on 03/19/2023 15:17:05 -------------------------------------------------------------------------------- Problem List Details Patient Name: Date of Service: [name]. 03/19/2023 2:00 PM Medical Record Number: [id number] Patient Account Number: [id number] Date of Birth/Sex: Treating RN: [birth year] (62 y.o. M) Primary Care Provider: [name] Other Clinician: Referring Provider: Treating Provider/Extender: Cleone Slim in Treatment: 3 Active Problems ICD-10 Encounter Code Description Active Date MDM Diagnosis E11.622 Type 2 diabetes mellitus with other skin ulcer 02/26/2023 No Yes I89.0 Lymphedema, not elsewhere classified 02/26/2023 No Yes L97.822 Non-pressure chronic ulcer of other part of left lower leg with fat layer exposed4/08/2023 No Yes L03.116 Cellulitis of left lower limb 02/26/2023 No Yes Inactive Problems [name], [name] ([id number]) [id number] Page 4 of 6 Resolved Problems Electronic Signature(s) Signed: 03/19/2023 1:41:56 PM By: Allen Derry PA-C Entered By: Allen Derry on 03/19/2023 13:41:55 -------------------------------------------------------------------------------- Progress Note Details Patient Name: Date of Service: [name], [name] 03/19/2023 2:00 PM Medical Record Number: [id number] Patient Account Number: [id number] Date of Birth/Sex:  Treating RN: February 09, 1961 (62 y.o. M) Primary Care Provider: [name] Other Clinician: Referring Provider: Treating Provider/Extender: Cleone Slim in Treatment: 3 Subjective Chief Complaint Information obtained from Patient Left LE Ulcers with Bilateral Lymphedema History of Present Illness (HPI) 02/20/18; is a 62 year old man who is a poorly controlled type  II diabetic with most recent hemoglobin A1c of 12.9 in January of this year. 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 He has not worn compression stockings. He is a nonsmoker He tells [place] that he has a history of wounds on his bilateral lower legs. It is really unclear when this started although he thinks they were there at Christmas time 2018 therefore they are not new. He was seen in his primary doctor's office on 02/05/18 noted to have skin breakdown and erythema. This was especially in the left leg he was given doxycycline and Lasix. He was seen in their clinic again yesterday. Felt to have left leg cellulitis. He was felt to possibly need debridement and he was referred here urgently. He is still on doxycycline. ABIs in this clinic were 0.89 on the right 0.97 on the left. Besides type 2 diabetes poorly controlled he has a history of proven gout, hyperlipidemia, hypertension and a history of bilateral cellulitis. He uses a walker to ambulate. 02/27/18; tolerated compression well. A lot of the areas have healed over. Still small open areas mostly on the right lateral and left posterior. I've elected to continue to dress this for another week. I would like him to come in next week with 20-30 mm below-knee stockings and we are setting him about elastic therapy to get these. I also emphasized skin lubrication. 03/06/18; everything is closed over here. He can be discharged in 20-30 mm below-knee stockings. He will need to lubricate the skin which is  already cracked and fissuring. I've explained to him it is possible he will need more compression than this. We'll have to see how this goes over some time Readmission: 02-26-2023 upon evaluation today patient appears to be doing well currently all things considered in general other than the fact that he does appear to have some infection in regard to his lower extremity on the left leg. He does have evidence of bilateral lower extremity lymphedema but in particular I think that he has cellulitis at this site. Fortunately I do not see any evidence of active infection locally nor systemically which is great news. No fevers, chills, nausea, vomiting, or diarrhea. Upon inspection patient's past medical history really has not changed significantly since we last saw him. He really does not have any major medical problems other than the lymphedema and diabetes. Unfortunately I think he just became cellulitic secondary to a crack in the back of the lymphedematous tissue on the left lower extremity that is what is causing the severe pain for him. He has had his Lasix increased by his primary care provider to 40 mg twice a day. He also currently has a negative DVT by Doppler confirmed which was stated in the primary care note as well which was reviewed today. He is on Eliquis. 03-05-2023 upon evaluation today patient unfortunately appears to be doing worse in regard to his wound. He has been tolerating the dressing changes without complication. Fortunately there does not appear to be any signs of active infection systemically though locally still has cellulitis. He has been taking the Bactrim as I prescribed last week unfortunately I think this is quite doing the job. 03-12-2023 upon evaluation today patient appears to be doing excellent in regard to his leg. This in fact is showing signs of healing quite nicely I do not see any evidence of infection locally or systemically which is great news and in  general I do  believe that removing in the right direction. 03-19-2023 upon evaluation today patient appears to be doing poorly currently in regard to his wound. Unfortunately despite the Bactrim and Cipro he has continued to have significant issues. Again we added the Cipro due to the blue-green drainage that was noted and again we had him continue the Bactrim. Nonetheless I have not really been able to get a good culture on this which I think is the issue. I do believe that he is tolerating the dressing changes as far as the compression wrapping there is nothing open but he does seem to have cellulitis still noted. Objective Constitutional Chronically ill appearing but in no apparent acute distress. Vitals Time Taken: 1:47 PM, Height: 65 in, Weight: 240 lbs, BMI: 39.9, Temperature: 97.8 F, Pulse: 75 bpm, Respiratory Rate: 18 breaths/min, Blood Pressure: 135/89 mmHg, Capillary Blood Glucose: 150 mg/dl. [name], [name] ([id number]) [id number] Page 5 of 6 Respiratory normal breathing without difficulty. Psychiatric this patient is able to make decisions and demonstrates good insight into disease process. Alert and Oriented x 3. pleasant and cooperative. General Notes: Upon inspection patient's wound bed actually showed signs of excellent progress epithelization and granulation in fact everything is completely closed this is great news. Unfortunately he continues to have issues nonetheless with what appears to be cellulitis and erythema especially toward the lateral and posterior portion of his leg. This is causing him significant pain I can tell he does not feel well. Integumentary (Hair, Skin) Wound #3 status is Open. Original cause of wound was Gradually Appeared. The date acquired was: 01/18/2023. The wound has been in treatment 3 weeks. The wound is located on the Left,Posterior Lower Leg. The wound measures 0cm length x 0cm width x 0cm depth; 0cm^2 area and 0cm^3 volume. There is  Fat Layer (Subcutaneous Tissue) exposed. There is a medium amount of serosanguineous drainage noted. The wound margin is epibole. There is small (1-33%) pink granulation within the wound bed. There is a large (67-100%) amount of necrotic tissue within the wound bed including Eschar and Adherent Slough. The periwound skin appearance exhibited: Dry/Scaly, Hemosiderin Staining, Erythema. The periwound skin appearance did not exhibit: Callus, Crepitus, Excoriation, Induration, Rash, Scarring, Maceration, Atrophie Blanche, Cyanosis, Ecchymosis, Mottled, Pallor, Rubor. The surrounding wound skin color is noted with erythema which is circumferential. Periwound temperature was noted as No Abnormality. Assessment Active Problems ICD-10 Type 2 diabetes mellitus with other skin ulcer Lymphedema, not elsewhere classified Non-pressure chronic ulcer of other part of left lower leg with fat layer exposed Cellulitis of left lower limb Procedures Wound #3 Pre-procedure diagnosis of Wound #3 is a Diabetic Wound/Ulcer of the Lower Extremity located on the Left,Posterior Lower Leg . There was a Three Layer Compression Therapy Procedure by Fonnie Mu, RN. Post procedure Diagnosis Wound #3: Same as Pre-Procedure Plan Follow-up Appointments: Return Appointment in 1 week. - w/ Allen Derry and Joanne Wednesday Rm # 9 03/26/23 @ 12:30 Other: - T new antibiotics Riki Altes) . If wound, left leg worsens in redness, pain, swelling, or fever go to the emergency department. ake Anesthetic: (In clinic) Topical Lidocaine 4% applied to wound bed Bathing/ Shower/ Hygiene: May shower and wash wound with soap and water. Edema Control - Lymphedema / SCD / Other: Elevate legs to the level of the heart or above for 30 minutes daily and/or when sitting for 3-4 times a day throughout the day. Avoid standing for long periods of time. Exercise regularly Home Health: Admit to Home Health  for skilled nursing wound care. May  utilize formulary equivalent dressing for wound treatment orders unless otherwise specified. New wound care orders this week; continue Home Health for wound care. May utilize formulary equivalent dressing for wound treatment orders unless otherwise specified. - home health to change Mondays and Fridays, wound center Wednesdays; home health to please closely monitor left leg if worsens please send patient to the emergency room. patient will start a second antibiotics today, totally two antibiotics. Other Home Health Orders/Instructions: [facility] The following medication(s) was prescribed: Nuzyra oral 150 mg tablet 2 2 tablet oral once daily at bedtime. Wait 2 hours after eating before taking this medication starting 03/19/2023 WOUND #3: - Lower Leg Wound Laterality: Left, Posterior Cleanser: Soap and Water 3 x Per Week/30 Days Discharge Instructions: May shower and wash wound with dial antibacterial soap and water prior to dressing change. Peri-Wound Care: Zinc Oxide Ointment 30g tube 3 x Per Week/30 Days Discharge Instructions: Apply Zinc Oxide to periwound with each dressing change Peri-Wound Care: Sween Lotion (Moisturizing lotion) 3 x Per Week/30 Days Discharge Instructions: Apply moisturizing lotion as directed Com pression Wrap: Urgo K2 Lite, two layer compression system, regular 3 x Per Week/30 Days Discharge Instructions: Apply Urgo K2 Lite as directed (alternative to 3 layer compression). Com pression Stockings: Circaid Juxta Lite Compression Wrap (DME) Compression Amount: 30-40 mmHg (left) Discharge Instructions: Apply Circaid Juxta Lite Compression Wrap daily as instructed. Apply first thing in the morning, remove at night before bed. [name], [name] ([id number]) [id number] Page 6 of 6 1. Based on what I am seeing I am going to go ahead and see about ordering for him. I did give him samples to get things started today. With that being said  I believe the Riki Altes would be the best way to go considering what he is failed thus far has been on Cipro as well as Bactrim and despite this his infection still seems to be very prevalent. I think we need something stronger to try to knock this out. 2. I am also can recommend that we go ahead and [place] continue with the Urgo lite 2 layer compression wrap which I think is doing extremely well for him 3. I am also going to suggest that he should continue to try to elevate his legs much as possible. Obviously the more this he can do the better. We will see patient back for reevaluation in 1 week here in the clinic. If anything worsens or changes patient will contact our office for additional recommendations. Electronic Signature(s) Signed: 03/19/2023 3:17:23 PM By: Allen Derry PA-C Entered By: Allen Derry on 03/19/2023 15:17:23 -------------------------------------------------------------------------------- SuperBill Details Patient Name: Date of Service: [name], [name] 03/19/2023 Medical Record Number: [id number] Patient Account Number: [id number] Date of Birth/Sex: Treating RN: 10/27/1961 (62 y.o. Charlean Merl, Lauren Primary Care Provider: [name] Other Clinician: Referring Provider: Treating Provider/Extender: Cleone Slim in Treatment: 3 Diagnosis Coding ICD-10 Codes Code Description [phone] Type 2 diabetes mellitus with other skin ulcer I89.0 Lymphedema, not elsewhere classified L97.822 Non-pressure chronic ulcer of other part of left lower leg with fat layer exposed L03.116 Cellulitis of left lower limb Facility Procedures : CPT4 Code: [id number] Description: (Facility Use Only) 29581LT - APPLY MULTLAY COMPRS LWR LT LEG Modifier: Quantity: 1 Physician Procedures : CPT4 Code Description Modifier [id number] - WC PHYS LEVEL 4 - EST PT ICD-10 Diagnosis Description E11.622 Type 2 diabetes mellitus with other skin ulcer I89.0 Lymphedema, not elsewhere  classified L97.822 Non-pressure chronic ulcer of other part of  left lower leg with fat layer exposed L03.116 Cellulitis of left lower limb Quantity: 1 Electronic Signature(s) Signed: 03/19/2023 3:18:06 PM By: Allen Derry PA-C Entered By: Allen Derry on 03/19/2023 15:18:05

## 2023-03-21 NOTE — Progress Notes (Signed)
[name], PAWELSKI ([id number]) [id number] Page 1 of 6 Visit Report for 03/19/2023 Arrival Information Details Patient Name: Date of Service: [name], [name] 03/19/2023 2:00 PM Medical Record Number: [id number] Patient Account Number: [id number] Date of Birth/Sex: Treating RN: 1961/01/02 (62 y.o. M) Primary Care Gracyn Allor: [name] Other Clinician: Referring Benigna Delisi: Treating Maveric Debono/Extender: Cleone Slim in Treatment: 3 Visit Information History Since Last Visit Added or deleted any medications: No Patient Arrived: Ambulatory Any new allergies or adverse reactions: No Arrival Time: 13:46 Had a fall or experienced change in No Accompanied By: self activities of daily living that may affect Transfer Assistance: None risk of falls: Patient Identification Verified: Yes Signs or symptoms of abuse/neglect since last visito No Secondary Verification Process Completed: Yes Hospitalized since last visit: No Implantable device outside of the clinic excluding No cellular tissue based products placed in the center since last visit: Has Dressing in Place as Prescribed: Yes Pain Present Now: Yes Electronic Signature(s) Signed: 03/20/2023 2:39:11 PM By: Karl Ito Entered By: Karl Ito on 03/19/2023 13:47:21 -------------------------------------------------------------------------------- Compression Therapy Details Patient Name: Date of Service: [name] 03/19/2023 2:00 PM Medical Record Number: [id number] Patient Account Number: [id number] Date of Birth/Sex: Treating RN: Aug 29, 1961 (62 y.o. Dianna Limbo Primary Care Mahogani Holohan: [name] Other Clinician: Referring Romie Keeble: Treating Jaclin Finks/Extender: Cleone Slim in Treatment: 3 Compression Therapy Performed for Wound Assessment: Wound #3 Left,Posterior Lower Leg Performed By: Clinician Karie Schwalbe, RN Compression Type: Three  Layer Post Procedure Diagnosis Same as Pre-procedure Electronic Signature(s) Signed: 03/19/2023 5:01:41 PM By: Karie Schwalbe RN Entered By: Karie Schwalbe on 03/19/2023 16:09:15 -------------------------------------------------------------------------------- Encounter Discharge Information Details Patient Name: Date of Service: [name]. 03/19/2023 2:00 PM Medical Record Number: [id number] Patient Account Number: [id number] Date of Birth/Sex: Treating RN: 10/16/1961 (62 y.o. [name], [name] Primary Care Roxann Vierra: [name] Other Clinician: Referring Cleveland Yarbro: Treating Isaias Dowson/Extender: Cleone Slim in Treatment: 3 Encounter Discharge Information Items Discharge Condition: Stable Ambulatory Status: Ambulatory Discharge Destination: Home Transportation: Private Auto Accompanied By: [address] KELYN, DEMMONS ([id number]) [id number] Page 2 of 6 Schedule Follow-up Appointment: Yes Clinical Summary of Care: Patient Declined Electronic Signature(s) Signed: 03/19/2023 4:25:39 PM By: Fonnie Mu RN Entered By: Fonnie Mu on 03/19/2023 14:41:43 -------------------------------------------------------------------------------- Lower Extremity Assessment Details Patient Name: Date of Service: [name]. 03/19/2023 2:00 PM Medical Record Number: [id number] Patient Account Number: [id number] Date of Birth/Sex: Treating RN: May 12, 1961 (62 y.o. M) Primary Care Tyreke Kaeser: [name] Other Clinician: Referring Ivory Maduro: Treating Temica Righetti/Extender: Lolita Patella Weeks in Treatment: 3 Edema Assessment Assessed: [Left: No] [Right: No] Edema: [Left: Ye] [Right: s] Calf Left: Right: Point of Measurement: 30 cm From Medial Instep 47 cm Ankle Left: Right: Point of Measurement: 10 cm From Medial Instep 32.8 cm Vascular Assessment Pulses: Dorsalis Pedis Palpable: [Left:Yes] Electronic  Signature(s) Signed: 03/19/2023 4:40:31 PM By: Thayer Dallas Entered By: Thayer Dallas on 03/19/2023 14:00:24 -------------------------------------------------------------------------------- Multi-Disciplinary Care Plan Details Patient Name: Date of Service: [name]. 03/19/2023 2:00 PM Medical Record Number: [id number] Patient Account Number: [id number] Date of Birth/Sex: Treating RN: 25-May-1961 (62 y.o. [name], [name] Primary Care Galilea Quito: [name] Other Clinician: Referring Kelsey Edman: Treating Deronda Christian/Extender: Cleone Slim in Treatment: 3 Active Inactive Pain, Acute or Chronic Nursing Diagnoses: Pain, acute or chronic: actual or potential Potential alteration in comfort, pain Goals: Patient will verbalize adequate pain control and receive pain control interventions during procedures as needed Date Initiated: 02/26/2023  Target Resolution Date: 05/28/2023 Goal Status: Active Patient/caregiver will verbalize comfort level met Date Initiated: 02/26/2023 Target Resolution Date: 05/28/2023 Goal Status: Active KERI, HOELZLE ([id number]) [id number] Page 3 of 6 Interventions: Encourage patient to take pain medications as prescribed Provide education on pain management Reposition patient for comfort Treatment Activities: Administer pain control measures as ordered : 02/26/2023 Notes: Wound/Skin Impairment Nursing Diagnoses: Knowledge deficit related to ulceration/compromised skin integrity Goals: Patient/caregiver will verbalize understanding of skin care regimen Date Initiated: 02/26/2023 Target Resolution Date: 05/28/2023 Goal Status: Active Interventions: Assess patient/caregiver ability to perform ulcer/skin care regimen upon admission and as needed Assess ulceration(s) every visit Provide education on ulcer and skin care Treatment Activities: Skin care regimen initiated : 02/26/2023 Topical wound management  initiated : 02/26/2023 Notes: Electronic Signature(s) Signed: 03/19/2023 4:25:39 PM By: Fonnie Mu RN Entered By: Fonnie Mu on 03/19/2023 14:37:19 -------------------------------------------------------------------------------- Pain Assessment Details Patient Name: Date of Service: [name] 03/19/2023 2:00 PM Medical Record Number: [id number] Patient Account Number: [id number] Date of Birth/Sex: Treating RN: 13-Apr-1961 (62 y.o. M) Primary Care Donnavan Covault: [name] Other Clinician: Referring Jaskaran Dauzat: Treating Khali Perella/Extender: Cleone Slim in Treatment: 3 Active Problems Location of Pain Severity and Description of Pain Patient Has Paino Yes Site Locations Rate the pain. Current Pain Level: 7 Pain Management and Medication Current Pain Management: Electronic Signature(s) HASSANI, LAMBROU S ([id number]) [id number] Page 4 of 6 Signed: 03/20/2023 2:39:11 PM By: Karl Ito Entered By: Karl Ito on 03/19/2023 13:48:43 -------------------------------------------------------------------------------- Patient/Caregiver Education Details Patient Name: Date of Service: [name] 5/1/2024andnbsp2:00 PM Medical Record Number: [id number] Patient Account Number: [id number] Date of Birth/Gender: Treating RN: Mar 31, 1961 (62 y.o. Lucious Groves Primary Care Physician: [name] Other Clinician: Referring Physician: Treating Physician/Extender: Cleone Slim in Treatment: 3 Education Assessment Education Provided To: Patient Education Topics Provided Wound/Skin Impairment: Methods: Explain/Verbal Responses: Reinforcements needed, State content correctly Electronic Signature(s) Signed: 03/19/2023 4:25:39 PM By: Fonnie Mu RN Entered By: Fonnie Mu on 03/19/2023 14:37:35 -------------------------------------------------------------------------------- Wound  Assessment Details Patient Name: Date of Service: [name]. 03/19/2023 2:00 PM Medical Record Number: [id number] Patient Account Number: [id number] Date of Birth/Sex: Treating RN: 1961/07/17 (62 y.o. M) Primary Care Shifa Brisbon: [name] Other Clinician: Referring Renardo Cheatum: Treating Mellie Buccellato/Extender: Cleone Slim in Treatment: 3 Wound Status Wound Number: 3 Primary Etiology: Diabetic Wound/Ulcer of the Lower Extremity Wound Location: Left, Posterior Lower Leg Secondary Etiology: Lymphedema Wounding Event: Gradually Appeared Wound Status: Open Date Acquired: 01/18/2023 Comorbid History: Hypertension, Type II Diabetes, Gout Weeks Of Treatment: 3 Clustered Wound: No Photos Wound Measurements Length: (cm) Width: (cm) Depth: (cm) Area: (cm) Volume: (cm) Delgrande, Jensen S ([id number]) Wound Description Classification: Grade 1 Wound Margin: Epibole Exudate Amount: Medium Exudate Type: Serosanguineous Exudate Color: red, brown Foul Odor After Cleansing: Slough/Fibrino 0 % Reduction in Area: 100% 0 % Reduction in Volume: 100% 0 Epithelialization: Small (1-33%) 0 0 [id number] Page 5 of 6 No Yes Wound Bed Granulation Amount: Small (1-33%) Exposed Structure Granulation Quality: Pink Fascia Exposed: No Necrotic Amount: Large (67-100%) Fat Layer (Subcutaneous Tissue) Exposed: Yes Necrotic Quality: Eschar, Adherent Slough Tendon Exposed: No Muscle Exposed: No Joint Exposed: No Bone Exposed: No Periwound Skin Texture Texture Color No Abnormalities Noted: No No Abnormalities Noted: No Callus: No Atrophie Blanche: No Crepitus: No Cyanosis: No Excoriation: No Ecchymosis: No Induration: No Erythema: Yes Rash: No Erythema Location: Circumferential Scarring: No Hemosiderin Staining: Yes Mottled: No Moisture Pallor: No No Abnormalities Noted: No Rubor:  No Dry / Scaly: Yes Maceration: No Temperature /  Pain Temperature: No Abnormality Treatment Notes Wound #3 (Lower Leg) Wound Laterality: Left, Posterior Cleanser Soap and Water Discharge Instruction: May shower and wash wound with dial antibacterial soap and water prior to dressing change. Peri-Wound Care Zinc Oxide Ointment 30g tube Discharge Instruction: Apply Zinc Oxide to periwound with each dressing change Sween Lotion (Moisturizing lotion) Discharge Instruction: Apply moisturizing lotion as directed Topical Primary Dressing Secondary Dressing Secured With Compression Wrap Urgo K2 Lite, two layer compression system, regular Discharge Instruction: Apply Urgo K2 Lite as directed (alternative to 3 layer compression). Compression Stockings Circaid Juxta Lite Compression Wrap Quantity: 1 Left Leg Compression Amount: 30-40 mmHg Discharge Instruction: Apply Circaid Juxta Lite Compression Wrap daily as instructed. Apply first thing in the morning, remove at night before bed. Add-Ons Electronic Signature(s) Signed: 03/20/2023 2:39:11 PM By: Karl Ito Entered By: Karl Ito on 03/19/2023 13:59:21 Vitals Details -------------------------------------------------------------------------------- [name] ([id number]) [id number] Page 6 of 6 Patient Name: Date of Service: WHITAKER, ISER 03/19/2023 2:00 PM Medical Record Number: [id number] Patient Account Number: [id number] Date of Birth/Sex: Treating RN: 17-Mar-1961 (62 y.o. M) Primary Care Shay Bartoli: [name] Other Clinician: Referring Japji Kok: Treating Shaylan Tutton/Extender: Cleone Slim in Treatment: 3 Vital Signs Time Taken: 13:47 Temperature (F): 97.8 Height (in): 65 Pulse (bpm): 75 Weight (lbs): 240 Respiratory Rate (breaths/min): 18 Body Mass Index (BMI): 39.9 Blood Pressure (mmHg): 135/89 Capillary Blood Glucose (mg/dl): 782 Reference Range: 80 - 120 mg / dl Electronic Signature(s) Signed: 03/20/2023  2:39:11 PM By: Karl Ito Entered By: Karl Ito on 03/19/2023 13:48:36

## 2023-03-24 DIAGNOSIS — Z86718 Personal history of other venous thrombosis and embolism: Secondary | ICD-10-CM | POA: Diagnosis not present

## 2023-03-24 DIAGNOSIS — E1140 Type 2 diabetes mellitus with diabetic neuropathy, unspecified: Secondary | ICD-10-CM | POA: Diagnosis not present

## 2023-03-24 DIAGNOSIS — E7800 Pure hypercholesterolemia, unspecified: Secondary | ICD-10-CM | POA: Diagnosis not present

## 2023-03-24 DIAGNOSIS — R600 Localized edema: Secondary | ICD-10-CM | POA: Diagnosis not present

## 2023-03-24 DIAGNOSIS — M109 Gout, unspecified: Secondary | ICD-10-CM | POA: Diagnosis not present

## 2023-03-24 DIAGNOSIS — K219 Gastro-esophageal reflux disease without esophagitis: Secondary | ICD-10-CM | POA: Diagnosis not present

## 2023-03-24 DIAGNOSIS — I10 Essential (primary) hypertension: Secondary | ICD-10-CM | POA: Diagnosis not present

## 2023-03-24 DIAGNOSIS — E1122 Type 2 diabetes mellitus with diabetic chronic kidney disease: Secondary | ICD-10-CM | POA: Diagnosis not present

## 2023-03-25 DIAGNOSIS — E1140 Type 2 diabetes mellitus with diabetic neuropathy, unspecified: Secondary | ICD-10-CM | POA: Diagnosis not present

## 2023-03-25 DIAGNOSIS — M7989 Other specified soft tissue disorders: Secondary | ICD-10-CM | POA: Diagnosis not present

## 2023-03-25 DIAGNOSIS — E1151 Type 2 diabetes mellitus with diabetic peripheral angiopathy without gangrene: Secondary | ICD-10-CM | POA: Diagnosis not present

## 2023-03-25 DIAGNOSIS — I2510 Atherosclerotic heart disease of native coronary artery without angina pectoris: Secondary | ICD-10-CM | POA: Diagnosis not present

## 2023-03-25 DIAGNOSIS — F70 Mild intellectual disabilities: Secondary | ICD-10-CM | POA: Diagnosis not present

## 2023-03-25 DIAGNOSIS — L0390 Cellulitis, unspecified: Secondary | ICD-10-CM | POA: Diagnosis not present

## 2023-03-25 DIAGNOSIS — I10 Essential (primary) hypertension: Secondary | ICD-10-CM | POA: Diagnosis not present

## 2023-03-25 DIAGNOSIS — M79606 Pain in leg, unspecified: Secondary | ICD-10-CM | POA: Diagnosis not present

## 2023-03-25 DIAGNOSIS — I872 Venous insufficiency (chronic) (peripheral): Secondary | ICD-10-CM | POA: Diagnosis not present

## 2023-03-26 ENCOUNTER — Ambulatory Visit: Payer: Self-pay | Admitting: *Deleted

## 2023-03-26 ENCOUNTER — Encounter (HOSPITAL_BASED_OUTPATIENT_CLINIC_OR_DEPARTMENT_OTHER): Payer: Medicare HMO | Admitting: Physician Assistant

## 2023-03-26 DIAGNOSIS — E1165 Type 2 diabetes mellitus with hyperglycemia: Secondary | ICD-10-CM | POA: Diagnosis not present

## 2023-03-26 DIAGNOSIS — M109 Gout, unspecified: Secondary | ICD-10-CM | POA: Diagnosis not present

## 2023-03-26 DIAGNOSIS — L03116 Cellulitis of left lower limb: Secondary | ICD-10-CM | POA: Diagnosis not present

## 2023-03-26 DIAGNOSIS — I10 Essential (primary) hypertension: Secondary | ICD-10-CM | POA: Diagnosis not present

## 2023-03-26 DIAGNOSIS — L97822 Non-pressure chronic ulcer of other part of left lower leg with fat layer exposed: Secondary | ICD-10-CM | POA: Diagnosis not present

## 2023-03-26 DIAGNOSIS — E785 Hyperlipidemia, unspecified: Secondary | ICD-10-CM | POA: Diagnosis not present

## 2023-03-26 DIAGNOSIS — L97228 Non-pressure chronic ulcer of left calf with other specified severity: Secondary | ICD-10-CM | POA: Diagnosis not present

## 2023-03-26 DIAGNOSIS — I890 Lymphedema, not elsewhere classified: Secondary | ICD-10-CM | POA: Diagnosis not present

## 2023-03-26 DIAGNOSIS — Z7901 Long term (current) use of anticoagulants: Secondary | ICD-10-CM | POA: Diagnosis not present

## 2023-03-26 DIAGNOSIS — E11622 Type 2 diabetes mellitus with other skin ulcer: Secondary | ICD-10-CM | POA: Diagnosis not present

## 2023-03-26 NOTE — Patient Instructions (Signed)
Visit Information  Thank you for taking time to visit with me today. Please don't hesitate to contact me if I can be of assistance to you.   Following are the goals we discussed today:   Goals Addressed             This Visit's Progress    Transportation       Goals:       You can now use [employer] [place] transportation to rides!  Call the # provided to you today on the wallet card to schedule rides 1-7 days ahead.  Call # provided to cancel any rides at least 1 hour before pickup time or you will be a NO SHOW which will leade to being ineligible if happens too many times Go to their office and get purchase a pass as discussed- $56 for 40 rides (will be cheaper than the bus fare)  Call your insurance provider for more information about your Enhanced Benefits (is life alert covered?) Make sure to order from [facility] catalog before end of month for this quarterly credit to shop                    Our next appointment is by telephone on 04/01/23   Please call the care guide team at [phone] if you need to cancel or reschedule your appointment.   If you are experiencing a Mental Health or Behavioral Health Crisis or need someone to talk to, please call the Suicide and Crisis Lifeline: 988 call 911   The patient verbalized understanding of instructions, educational materials, and care plan provided today and DECLINED offer to receive copy of patient instructions, educational materials, and care plan.   Telephone follow up appointment with care management team member scheduled for: 04/01/23  Reece Levy, MSW, LCSW Clinical Social Worker [employer] [employer] [phone]

## 2023-03-26 NOTE — Patient Outreach (Signed)
  Care Coordination   In Person Provider Office Visit Note   03/26/2023 Name: [name] MRN: [id number] DOB: 1961/08/28  [name] is a 62 y.o. year old male who sees [name], [place] for primary care. I engaged with [name] in the providers office today.  What matters to the patients health and wellness today?  Getting wound care today at the [facility] .    Goals Addressed             This Visit's Progress    Transportation       Goals:       You can now use [employer] [place] transportation to rides!  Call the # provided to you today on the wallet card to schedule rides 1-7 days ahead.  Call # provided to cancel any rides at least 1 hour before pickup time or you will be a NO SHOW which will leade to being ineligible if happens too many times Go to their office and get purchase a pass as discussed- $56 for 40 rides (will be cheaper than the bus fare)  Call your insurance provider for more information about your Enhanced Benefits (is life alert covered?) Make sure to order from [facility] catalog before end of month for this quarterly credit to shop                    SDOH assessments and interventions completed:  Yes  SDOH Interventions Today    Flowsheet Row Most Recent Value  SDOH Interventions   Transportation Interventions [employer] ([employer])  [Pt has been approved for [employer] [place] through 03/17/2026.]        Care Coordination Interventions:  Yes, provided  Interventions Today    Flowsheet Row Most Recent Value  Chronic Disease   Chronic disease during today's visit Diabetes  General Interventions   General Interventions Discussed/Reviewed [name], Doctor Visits  [CSW met with pt in person before/at Wound Center today to share info and educate pt on using the [employer] van]  Doctor Visits Discussed/Reviewed Doctor Visits Discussed  Education Interventions   Provided Verbal Education On  [name]  [Provided pt with easy steps and wallet card for using the [employer] [place] transportation. Pt is paying $1.50 per ride now for bus fare and can purchase an [employer] [place] pass for 40 rides @$1.40 per ride/$56]  Safety Interventions   Safety Discussed/Reviewed Safety Discussed       Follow up plan: Follow up call scheduled for 04/01/23    Encounter Outcome:  Pt. Visit Completed

## 2023-03-26 NOTE — Progress Notes (Signed)
[name] ([id number]) [id number] Page 1 of 5 Visit Report for 03/26/2023 Chief Complaint Document Details Patient Name: Date of Service: [name], [name] 03/26/2023 12:30 PM Medical Record Number: [id number] Patient Account Number: [id number] Date of Birth/Sex: Treating RN: [birth year] (62 y.o. M) Primary Care Provider: [name] Other Clinician: Referring Provider: Treating Provider/Extender: Cleone Slim in Treatment: 4 Information Obtained from: Patient Chief Complaint Left LE Ulcers with Bilateral Lymphedema Electronic Signature(s) Signed: 03/26/2023 12:54:00 PM By: Allen Derry PA-C Entered By: Allen Derry on 03/26/2023 12:54:00 -------------------------------------------------------------------------------- HPI Details Patient Name: Date of Service: [name] 03/26/2023 12:30 PM Medical Record Number: [id number] Patient Account Number: [id number] Date of Birth/Sex: Treating RN: 1961-08-29 (62 y.o. M) Primary Care Provider: [name] Other Clinician: Referring Provider: Treating Provider/Extender: Cleone Slim in Treatment: 4 History of Present Illness HPI Description: 02/20/18; is a 63 year old man who is a poorly controlled type II diabetic with most recent hemoglobin A1c of 12.9 in January of this year. 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 He has not worn compression stockings. He is a nonsmoker He tells [place] that he has a history of wounds on his bilateral lower legs. It is really unclear when this started although he thinks they were there at Christmas time 2018 therefore they are not new. He was seen in his primary doctor's office on 02/05/18 noted to have skin breakdown and erythema. This was especially in the left leg he was given doxycycline and Lasix. He was seen in their clinic again yesterday. Felt to have left leg  cellulitis. He was felt to possibly need debridement and he was referred here urgently. He is still on doxycycline. ABIs in this clinic were 0.89 on the right 0.97 on the left. Besides type 2 diabetes poorly controlled he has a history of proven gout, hyperlipidemia, hypertension and a history of bilateral cellulitis. He uses a walker to ambulate. 02/27/18; tolerated compression well. A lot of the areas have healed over. Still small open areas mostly on the right lateral and left posterior. I've elected to continue to dress this for another week. I would like him to come in next week with 20-30 mm below-knee stockings and we are setting him about elastic therapy to get these. I also emphasized skin lubrication. 03/06/18; everything is closed over here. He can be discharged in 20-30 mm below-knee stockings. He will need to lubricate the skin which is already cracked and fissuring. I've explained to him it is possible he will need more compression than this. We'll have to see how this goes over some time Readmission: 02-26-2023 upon evaluation today patient appears to be doing well currently all things considered in general other than the fact that he does appear to have some infection in regard to his lower extremity on the left leg. He does have evidence of bilateral lower extremity lymphedema but in particular I think that he has cellulitis at this site. Fortunately I do not see any evidence of active infection locally nor systemically which is great news. No fevers, chills, nausea, vomiting, or diarrhea. Upon inspection patient's past medical history really has not changed significantly since we last saw him. He really does not have any major medical problems other than the lymphedema and diabetes. Unfortunately I think he just became cellulitic secondary to a crack in the back of the lymphedematous tissue on the left  lower extremity that is what is causing the severe pain for him. He has had his  Lasix increased by his primary care provider to 40 mg twice a day. He also currently has a negative DVT by Doppler confirmed which was stated in the primary care note as well which was reviewed today. He is on Eliquis. 03-05-2023 upon evaluation today patient unfortunately appears to be doing worse in regard to his wound. He has been tolerating the dressing changes without complication. Fortunately there does not appear to be any signs of active infection systemically though locally still has cellulitis. He has been taking the Bactrim as I prescribed last week unfortunately I think this is quite doing the job. 03-12-2023 upon evaluation today patient appears to be doing excellent in regard to his leg. This in fact is showing signs of healing quite nicely I do not see any evidence of infection locally or systemically which is great news and in general I do believe that removing in the right direction. 03-19-2023 upon evaluation today patient appears to be doing poorly currently in regard to his wound. Unfortunately despite the Bactrim and Cipro he has continued to have significant issues. Again we added the Cipro due to the blue-green drainage that was noted and again we had him continue the Bactrim. Nonetheless I have not really been able to get a good culture on this which I think is the issue. I do believe that he is tolerating the dressing changes as far as the compression wrapping there is nothing open but he does seem to have cellulitis still noted. [name] ([id number]) [id number] Page 2 of 5 03-26-2023 upon evaluation today patient appears to be doing well currently in regard to his wound which actually is showing signs of pretty much being completely healed. I would like to monitor this 1 more week before completely releasing him but I think he is doing much better. Electronic Signature(s) Signed: 03/26/2023 1:30:22 PM By: Allen Derry PA-C Entered By: Allen Derry on  03/26/2023 13:30:22 -------------------------------------------------------------------------------- Physical Exam Details Patient Name: Date of Service: [name] 03/26/2023 12:30 PM Medical Record Number: [id number] Patient Account Number: [id number] Date of Birth/Sex: Treating RN: 1961/08/07 (62 y.o. M) Primary Care Provider: [name] Other Clinician: Referring Provider: Treating Provider/Extender: Cleone Slim in Treatment: 4 Constitutional Chronically ill appearing but in no apparent acute distress. Respiratory normal breathing without difficulty. Psychiatric this patient is able to make decisions and demonstrates good insight into disease process. Alert and Oriented x 3. pleasant and cooperative. Notes Upon inspection patient's wound bed actually showed signs of good granulation and epithelization at this point. There does not appear to be any signs of infection locally nor systemically right now and I feel like the Riki Altes has done well to help calm down the inflammation of the leg this is doing much better he does not have the pain he was having last week. We do have the juxta lite to apply today. Electronic Signature(s) Signed: 03/26/2023 1:30:50 PM By: Allen Derry PA-C Entered By: Allen Derry on 03/26/2023 13:30:50 -------------------------------------------------------------------------------- Physician Orders Details Patient Name: Date of Service: JOSTON, PATTON 03/26/2023 12:30 PM Medical Record Number: [id number] Patient Account Number: [id number] Date of Birth/Sex: Treating RN: 1961-07-31 (62 y.o. Dianna Limbo Primary Care Provider: [name] Other Clinician: Referring Provider: Treating Provider/Extender: Cleone Slim in Treatment: 4 Verbal / Phone Orders: No Diagnosis Coding Follow-up Appointments ppointment in 1 week. Allen Derry PA  Return A Other: - KEEP TAKING NEW ANTIBIOTICS Riki Altes) . If  wound, left leg worsens in redness, pain, swelling, or fever go to the emergency department. Edema Control - Lymphedema / SCD / Other Elevate legs to the level of the heart or above for 30 minutes daily and/or when sitting for 3-4 times a day throughout the day. Avoid standing for long periods of time. Exercise regularly - As T olerated Other Edema Control Orders/Instructions: - Left Leg- Wear Juxtalite compression sock and garment.Wear during the day and take off at night. Additional Orders / Instructions Other: - Elevate both legs at heart level or above when sitting. Home Health Other Home Health Orders/Instructions: [facility] Wound Treatment Wound #3 - Lower Leg Wound Laterality: Left, Posterior Peri-Wound Care: Sween Lotion (Moisturizing lotion) Discharge Instructions: Apply moisturizing lotion as directed Compression WrapJOSHUE, HAECKER ([id number]) [id number] Page 3 of 5 Electronic Signature(s) Unsigned Entered By: Karie Schwalbe on 03/26/2023 13:24:19 -------------------------------------------------------------------------------- Problem List Details Patient Name: Date of Service: JASSON, SCHWEIN 03/26/2023 12:30 PM Medical Record Number: [id number] Patient Account Number: [id number] Date of Birth/Sex: Treating RN: [birth year] (62 y.o. M) Primary Care Provider: [name] Other Clinician: Referring Provider: Treating Provider/Extender: Cleone Slim in Treatment: 4 Active Problems ICD-10 Encounter Code Description Active Date MDM Diagnosis E11.622 Type 2 diabetes mellitus with other skin ulcer 02/26/2023 No Yes I89.0 Lymphedema, not elsewhere classified 02/26/2023 No Yes L97.822 Non-pressure chronic ulcer of other part of left lower leg with fat layer exposed4/08/2023 No Yes L03.116 Cellulitis of left lower limb 02/26/2023 No Yes Inactive Problems Resolved Problems Electronic  Signature(s) Signed: 03/26/2023 12:53:47 PM By: Allen Derry PA-C Entered By: Allen Derry on 03/26/2023 12:53:47 -------------------------------------------------------------------------------- Progress Note Details Patient Name: Date of Service: [name]. 03/26/2023 12:30 PM Medical Record Number: [id number] Patient Account Number: [id number] Date of Birth/Sex: Treating RN: [birth year] (62 y.o. M) Primary Care Provider: [name] Other Clinician: Referring Provider: Treating Provider/Extender: Cleone Slim in Treatment: 4 Subjective Chief Complaint Information obtained from Patient Left LE Ulcers with Bilateral Lymphedema History of Present Illness (HPI) 02/20/18; is a 62 year old man who is a poorly controlled type II diabetic with most recent hemoglobin A1c of 12.9 in January of this year. 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 He has not worn compression stockings. He is a nonsmoker TRUEN, SCHAUL S ([id number]) [id number] Page 4 of 5 He tells [place] that he has a history of wounds on his bilateral lower legs. It is really unclear when this started although he thinks they were there at Christmas time 2018 therefore they are not new. He was seen in his primary doctor's office on 02/05/18 noted to have skin breakdown and erythema. This was especially in the left leg he was given doxycycline and Lasix. He was seen in their clinic again yesterday. Felt to have left leg cellulitis. He was felt to possibly need debridement and he was referred here urgently. He is still on doxycycline. ABIs in this clinic were 0.89 on the right 0.97 on the left. Besides type 2 diabetes poorly controlled he has a history of proven gout, hyperlipidemia, hypertension and a history of bilateral cellulitis. He uses a walker to ambulate. 02/27/18; tolerated compression well. A lot of the areas have  healed over. Still small open areas mostly on the right lateral and left posterior. I've elected to continue  to dress this for another week. I would like him to come in next week with 20-30 mm below-knee stockings and we are setting him about elastic therapy to get these. I also emphasized skin lubrication. 03/06/18; everything is closed over here. He can be discharged in 20-30 mm below-knee stockings. He will need to lubricate the skin which is already cracked and fissuring. I've explained to him it is possible he will need more compression than this. We'll have to see how this goes over some time Readmission: 02-26-2023 upon evaluation today patient appears to be doing well currently all things considered in general other than the fact that he does appear to have some infection in regard to his lower extremity on the left leg. He does have evidence of bilateral lower extremity lymphedema but in particular I think that he has cellulitis at this site. Fortunately I do not see any evidence of active infection locally nor systemically which is great news. No fevers, chills, nausea, vomiting, or diarrhea. Upon inspection patient's past medical history really has not changed significantly since we last saw him. He really does not have any major medical problems other than the lymphedema and diabetes. Unfortunately I think he just became cellulitic secondary to a crack in the back of the lymphedematous tissue on the left lower extremity that is what is causing the severe pain for him. He has had his Lasix increased by his primary care provider to 40 mg twice a day. He also currently has a negative DVT by Doppler confirmed which was stated in the primary care note as well which was reviewed today. He is on Eliquis. 03-05-2023 upon evaluation today patient unfortunately appears to be doing worse in regard to his wound. He has been tolerating the dressing changes without complication. Fortunately there does not  appear to be any signs of active infection systemically though locally still has cellulitis. He has been taking the Bactrim as I prescribed last week unfortunately I think this is quite doing the job. 03-12-2023 upon evaluation today patient appears to be doing excellent in regard to his leg. This in fact is showing signs of healing quite nicely I do not see any evidence of infection locally or systemically which is great news and in general I do believe that removing in the right direction. 03-19-2023 upon evaluation today patient appears to be doing poorly currently in regard to his wound. Unfortunately despite the Bactrim and Cipro he has continued to have significant issues. Again we added the Cipro due to the blue-green drainage that was noted and again we had him continue the Bactrim. Nonetheless I have not really been able to get a good culture on this which I think is the issue. I do believe that he is tolerating the dressing changes as far as the compression wrapping there is nothing open but he does seem to have cellulitis still noted. 03-26-2023 upon evaluation today patient appears to be doing well currently in regard to his wound which actually is showing signs of pretty much being completely healed. I would like to monitor this 1 more week before completely releasing him but I think he is doing much better. Objective Constitutional Chronically ill appearing but in no apparent acute distress. Vitals Time Taken: 12:43 PM, Height: 65 in, Weight: 240 lbs, BMI: 39.9, Temperature: 98.1 F, Pulse: 77 bpm, Respiratory Rate: 18 breaths/min, Blood Pressure: 123/85 mmHg. Respiratory normal breathing without difficulty. Psychiatric this patient is able to make decisions and demonstrates good insight into  disease process. Alert and Oriented x 3. pleasant and cooperative. General Notes: Upon inspection patient's wound bed actually showed signs of good granulation and epithelization at this point.  There does not appear to be any signs of infection locally nor systemically right now and I feel like the Riki Altes has done well to help calm down the inflammation of the leg this is doing much better he does not have the pain he was having last week. We do have the juxta lite to apply today. Integumentary (Hair, Skin) Wound #3 status is Open. Original cause of wound was Gradually Appeared. The date acquired was: 01/18/2023. The wound has been in treatment 4 weeks. The wound is located on the Left,Posterior Lower Leg. The wound measures 0cm length x 0cm width x 0cm depth; 0cm^2 area and 0cm^3 volume. There is no tunneling or undermining noted. There is a none present amount of drainage noted. The wound margin is epibole. There is no granulation within the wound bed. There is no necrotic tissue within the wound bed. The periwound skin appearance had no abnormalities noted for texture. The periwound skin appearance had no abnormalities noted for moisture. The periwound skin appearance had no abnormalities noted for color. Periwound temperature was noted as No Abnormality. Assessment Active Problems ICD-10 Type 2 diabetes mellitus with other skin ulcer Lymphedema, not elsewhere classified Non-pressure chronic ulcer of other part of left lower leg with fat layer exposed Cellulitis of left lower limb ROCKWELL, ZELDIN ([id number]) [id number] Page 5 of 5 Plan Follow-up Appointments: Return Appointment in 1 week. Allen Derry PA Other: - KEEP TAKING NEW ANTIBIOTICS Riki Altes) . If wound, left leg worsens in redness, pain, swelling, or fever go to the emergency department. Edema Control - Lymphedema / SCD / Other: Elevate legs to the level of the heart or above for 30 minutes daily and/or when sitting for 3-4 times a day throughout the day. Avoid standing for long periods of time. Exercise regularly - As Tolerated Other Edema Control Orders/Instructions: - Left Leg- Wear  Juxtalite compression sock and garment.Wear during the day and take off at night. Additional Orders / Instructions: Other: - Elevate both legs at heart level or above when sitting. Home Health: Other Home Health Orders/Instructions: [facility] WOUND #3: - Lower Leg Wound Laterality: Left, Posterior Peri-Wound Care: Sween Lotion (Moisturizing lotion) Discharge Instructions: Apply moisturizing lotion as directed Com pression Wrap: JUXTALITE 1. I am good recommend currently that we have the patient continue to monitor for any signs of infection or worsening. Based on what I am seeing I do believe that we are making excellent progress however. 2. I am going to suggest as well that he continue to monitor for any signs of infection. Again right now he should be continuing the [place] to get this completely gone and hopefully that we will completely clear the cellulitis as well. 3. He does have his juxta lite we will show him how to put that on he will be doing that at home as well. We will see patient back for reevaluation in 1 week here in the clinic. If anything worsens or changes patient will contact our office for additional recommendations. Electronic Signature(s) Signed: 03/26/2023 1:31:22 PM By: Allen Derry PA-C Entered By: Allen Derry on 03/26/2023 13:31:21 -------------------------------------------------------------------------------- SuperBill Details Patient Name: Date of Service: ALOYS, GLANZMAN 03/26/2023 Medical Record Number: [id number] Patient Account Number: [id number] Date of Birth/Sex: Treating RN: September 24, 1961 (62 y.o. M) Primary Care Provider: [name] Other Clinician: Referring  Provider: Treating Provider/Extender: Lolita Patella Weeks in Treatment: 4 Diagnosis Coding ICD-10 Codes Code Description E11.622 Type 2 diabetes mellitus with other skin ulcer I89.0 Lymphedema, not elsewhere classified L97.822 Non-pressure chronic ulcer of other  part of left lower leg with fat layer exposed L03.116 Cellulitis of left lower limb Physician Procedures : CPT4 Code Description Modifier [id number] - WC PHYS LEVEL 3 - EST PT ICD-10 Diagnosis Description E11.622 Type 2 diabetes mellitus with other skin ulcer I89.0 Lymphedema, not elsewhere classified L97.822 Non-pressure chronic ulcer of other part of  left lower leg with fat layer exposed L03.116 Cellulitis of left lower limb Quantity: 1 Electronic Signature(s) Signed: 03/26/2023 1:32:02 PM By: Allen Derry PA-C Entered By: Allen Derry on 03/26/2023 13:32:02

## 2023-03-27 NOTE — Progress Notes (Addendum)
DAXXTON, SOUTHALL ([id number]) [id number] Page 1 of 8 Visit Report for 03/26/2023 Arrival Information Details Patient Name: Date of Service: [name] [name] 03/26/2023 12:30 PM Medical Record Number: [id number] Patient Account Number: [id number] Date of Birth/Sex: Treating RN: 04-Jun-1961 (62 y.o. Dianna Limbo Primary Care Lynley Killilea: [name] Other Clinician: Referring Dane Kopke: Treating Archer Vise/Extender: Cleone Slim in Treatment: 4 Visit Information History Since Last Visit Added or deleted any medications: No Patient Arrived: [name] Any new allergies or adverse reactions: No Arrival Time: 12:42 Had a fall or experienced change in No Accompanied By: self activities of daily living that may affect Transfer Assistance: None risk of falls: Patient Identification Verified: Yes Signs or symptoms of abuse/neglect since last visito No Hospitalized since last visit: No Implantable device outside of the clinic excluding No cellular tissue based products placed in the center since last visit: Has Dressing in Place as Prescribed: Yes Has Compression in Place as Prescribed: Yes Pain Present Now: No Electronic Signature([name]) Signed: 03/26/2023 4:46:50 PM By: Karie Schwalbe RN Entered By: Karie Schwalbe on 03/26/2023 12:43:22 -------------------------------------------------------------------------------- Clinic Level of Care Assessment Details Patient Name: Date of Service: [name] [name] 03/26/2023 12:30 PM Medical Record Number: [id number] Patient Account Number: [id number] Date of Birth/Sex: Treating RN: 09-01-1961 (63 y.o. M) Primary Care Epifanio Labrador: [name] Other Clinician: Referring Dempsey Knotek: Treating Reginald Weida/Extender: Cleone Slim in Treatment: 4 Clinic Level of Care Assessment Items TOOL 4 Quantity Score []  - 0 Use when only an EandM is performed on FOLLOW-UP visit ASSESSMENTS - Nursing  Assessment / Reassessment X- 1 10 Reassessment of Co-morbidities (includes updates in patient status) X- 1 5 Reassessment of Adherence to Treatment Plan ASSESSMENTS - Wound and Skin A ssessment / Reassessment X - Simple Wound Assessment / Reassessment - one wound 1 5 []  - 0 Complex Wound Assessment / Reassessment - multiple wounds []  - 0 Dermatologic / Skin Assessment (not related to wound area) ASSESSMENTS - Focused Assessment X- 1 5 Circumferential Edema Measurements - multi extremities []  - 0 Nutritional Assessment / Counseling / Intervention [name], [name] ([id number]) [id number] Page 2 of 8 X- 1 5 Lower Extremity Assessment (monofilament, tuning fork, pulses) []  - 0 Peripheral Arterial Disease Assessment (using hand held doppler) ASSESSMENTS - Ostomy and/or Continence Assessment and Care []  - 0 Incontinence Assessment and Management []  - 0 Ostomy Care Assessment and Management (repouching, etc.) PROCESS - Coordination of Care X - Simple Patient / Family Education for ongoing care 1 15 []  - 0 Complex (extensive) Patient / Family Education for ongoing care X- 1 10 Staff obtains Chiropractor, Records, T Results / Process Orders est []  - 0 Staff telephones HHA, Nursing Homes / Clarify orders / etc []  - 0 Routine Transfer to another Facility (non-emergent condition) []  - 0 Routine Hospital Admission (non-emergent condition) []  - 0 New Admissions / Manufacturing engineer / Ordering NPWT Apligraf, etc. , []  - 0 Emergency Hospital Admission (emergent condition) X- 1 10 Simple Discharge Coordination []  - 0 Complex (extensive) Discharge Coordination PROCESS - Special Needs []  - 0 Pediatric / Minor Patient Management []  - 0 Isolation Patient Management []  - 0 Hearing / Language / Visual special needs []  - 0 Assessment of Community assistance (transportation, D/C planning, etc.) []  - 0 Additional assistance / Altered mentation []  -  0 Support Surface([name]) Assessment (bed, cushion, seat, etc.) INTERVENTIONS - Wound Cleansing / Measurement X - Simple Wound Cleansing - one wound 1 5 []  - 0 Complex Wound  Cleansing - multiple wounds X- 1 5 Wound Imaging (photographs - any number of wounds) []  - 0 Wound Tracing (instead of photographs) X- 1 5 Simple Wound Measurement - one wound []  - 0 Complex Wound Measurement - multiple wounds INTERVENTIONS - Wound Dressings X - Small Wound Dressing one or multiple wounds 1 10 []  - 0 Medium Wound Dressing one or multiple wounds []  - 0 Large Wound Dressing one or multiple wounds []  - 0 Application of Medications - topical []  - 0 Application of Medications - injection INTERVENTIONS - Miscellaneous []  - 0 External ear exam []  - 0 Specimen Collection (cultures, biopsies, blood, body fluids, etc.) []  - 0 Specimen([name]) / Culture([name]) sent or taken to Lab for analysis []  - 0 Patient Transfer (multiple staff / Nurse, adult / Similar devices) []  - 0 Simple Staple / Suture removal (25 or less) []  - 0 Complex Staple / Suture removal (26 or more) []  - 0 Hypo / Hyperglycemic Management (close monitor of Blood Glucose) [name] [name] ([id number]) [id number] Page 3 of 8 []  - 0 Ankle / Brachial Index (ABI) - do not check if billed separately X- 1 5 Vital Signs Has the patient been seen at the hospital within the last three years: Yes Total Score: 95 Level Of Care: New/Established - Level 3 Electronic Signature([name]) Signed: 05/19/2023 3:00:20 PM By: Pearletha Alfred Entered By: Pearletha Alfred on 04/16/2023 10:34:43 -------------------------------------------------------------------------------- Encounter Discharge Information Details Patient Name: Date of Service: [name] [name]. 03/26/2023 12:30 PM Medical Record Number: [id number] Patient Account Number: [id number] Date of Birth/Sex: Treating RN: Aug 14, 1961 (62 y.o. Dianna Limbo Primary Care Kerah Hardebeck: [name] Other Clinician: Referring Jakaylah Schlafer: Treating Brysen Shankman/Extender: Cleone Slim in Treatment: 4 Encounter Discharge Information Items Discharge Condition: Stable Ambulatory Status: Cane Discharge Destination: Home Transportation: Private Auto Accompanied By: self Schedule Follow-up Appointment: Yes Clinical Summary of Care: Patient Declined Electronic Signature([name]) Signed: 03/26/2023 4:46:50 PM By: Karie Schwalbe RN Entered By: Karie Schwalbe on 03/26/2023 13:17:58 -------------------------------------------------------------------------------- Lower Extremity Assessment Details Patient Name: Date of Service: [name], [name] 03/26/2023 12:30 PM Medical Record Number: [id number] Patient Account Number: [id number] Date of Birth/Sex: Treating RN: Apr 07, 1961 (62 y.o. Dianna Limbo Primary Care Consetta Cosner: [name] Other Clinician: Referring Shadeed Colberg: Treating Davonna Ertl/Extender: Lolita Patella Weeks in Treatment: 4 Edema Assessment Assessed: [Left: No] [Right: No] Edema: [Left: Ye] [Right: [name]] Calf Left: Right: Point of Measurement: 30 cm From Medial Instep 47 cm Ankle Left: Right: Point of Measurement: 10 cm From Medial Instep 32.1 cm Vascular Assessment Left: [[id number] Page 4 of 8Right:] Pulses: Dorsalis Pedis Palpable: [[id number] Page 4 of 8Yes] Electronic Signature([name]) Signed: 03/26/2023 4:46:50 PM By: Karie Schwalbe RN Entered By: Karie Schwalbe on 03/26/2023 12:50:00 -------------------------------------------------------------------------------- Multi Wound Chart Details Patient Name: Date of Service: [name] [name]. 03/26/2023 12:30 PM Medical Record Number: [id number] Patient Account Number: [id number] Date of Birth/Sex: Treating RN: Jan 01, 1961 (62 y.o. M) Primary Care Miho Monda: [name] Other Clinician: Referring Rhenda [place]: Treating Ilija Maxim/Extender: Cleone Slim in Treatment: 4 Vital Signs Height(in): 65 Pulse(bpm): 77 Weight(lbs): 240 Blood Pressure(mmHg): 123/85 Body Mass Index(BMI): 39.9 Temperature(F): 98.1 Respiratory Rate(breaths/min): 18 [3:Photos:] [N/A:N/A] Left, Posterior Lower Leg N/A N/A Wound Location: Gradually Appeared N/A N/A Wounding Event: Diabetic Wound/Ulcer of the Lower N/A N/A Primary Etiology: Extremity Lymphedema N/A N/A Secondary Etiology: Hypertension, Type II Diabetes, Gout N/A N/A Comorbid History: 01/18/2023 N/A N/A Date Acquired: 4 N/A N/A Weeks of Treatment: Open N/A N/A Wound Status:  No N/A N/A Wound Recurrence: 0x0x0 N/A N/A Measurements L x W x D (cm) 0 N/A N/A A (cm) : rea 0 N/A N/A Volume (cm) : 100.00% N/A N/A % Reduction in A rea: 100.00% N/A N/A % Reduction in Volume: Grade 1 N/A N/A Classification: Medium N/A N/A Exudate A mount: Serosanguineous N/A N/A Exudate Type: red, brown N/A N/A Exudate Color: Epibole N/A N/A Wound Margin: None Present (0%) N/A N/A Granulation A mount: None Present (0%) N/A N/A Necrotic A mount: Fascia: No N/A N/A Exposed Structures: Fat Layer (Subcutaneous Tissue): No Tendon: No Muscle: No Joint: No Bone: No Large (67-100%) N/A N/A Epithelialization: Excoriation: No N/A N/A Periwound Skin Texture: Induration: No Callus: No Crepitus: No Rash: No [name], [name] ([id number]) [id number] Page 5 of 8 Scarring: No Dry/Scaly: Yes N/A N/A Periwound Skin Moisture: Maceration: No Atrophie Blanche: No N/A N/A Periwound Skin Color: Cyanosis: No Ecchymosis: No Erythema: No Hemosiderin Staining: No Mottled: No Pallor: No Rubor: No No Abnormality N/A N/A Temperature: Treatment Notes Electronic Signature([name]) Signed: 03/26/2023 12:53:51 PM By: Allen Derry PA-C Entered By: Allen Derry on 03/26/2023  12:53:51 -------------------------------------------------------------------------------- Multi-Disciplinary Care Plan Details Patient Name: Date of Service: [name], [name] 03/26/2023 12:30 PM Medical Record Number: [id number] Patient Account Number: [id number] Date of Birth/Sex: Treating RN: August 28, 1961 (62 y.o. Dianna Limbo Primary Care Jae Bruck: [name] Other Clinician: Referring Dontavius Keim: Treating Laquilla Dault/Extender: Cleone Slim in Treatment: 4 Active Inactive Pain, Acute or Chronic Nursing Diagnoses: Pain, acute or chronic: actual or potential Potential alteration in comfort, pain Goals: Patient will verbalize adequate pain control and receive pain control interventions during procedures as needed Date Initiated: 02/26/2023 Target Resolution Date: 05/28/2023 Goal Status: Active Patient/caregiver will verbalize comfort level met Date Initiated: 02/26/2023 Target Resolution Date: 05/28/2023 Goal Status: Active Interventions: Encourage patient to take pain medications as prescribed Provide education on pain management Reposition patient for comfort Treatment Activities: Administer pain control measures as ordered : 02/26/2023 Notes: Wound/Skin Impairment Nursing Diagnoses: Knowledge deficit related to ulceration/compromised skin integrity Goals: Patient/caregiver will verbalize understanding of skin care regimen Date Initiated: 02/26/2023 Target Resolution Date: 05/28/2023 Goal Status: Active Interventions: Assess patient/caregiver ability to perform ulcer/skin care regimen upon admission and as needed Assess ulceration([name]) every visit MAGUIRE, QUIRING ([id number]) [id number] Page 6 of 8 Provide education on ulcer and skin care Treatment Activities: Skin care regimen initiated : 02/26/2023 Topical wound management initiated : 02/26/2023 Notes: Electronic Signature([name]) Signed: 03/26/2023 4:46:50 PM By: Karie Schwalbe  RN Entered By: Karie Schwalbe on 03/26/2023 13:16:49 -------------------------------------------------------------------------------- Pain Assessment Details Patient Name: Date of Service: [name], [name] 03/26/2023 12:30 PM Medical Record Number: [id number] Patient Account Number: [id number] Date of Birth/Sex: Treating RN: 05/23/1961 (62 y.o. Dianna Limbo Primary Care Ladawn Boullion: [name] Other Clinician: Referring Tyson Masin: Treating Marvyn Torrez/Extender: Cleone Slim in Treatment: 4 Active Problems Location of Pain Severity and Description of Pain Patient Has Paino No Site Locations Pain Management and Medication Current Pain Management: Electronic Signature([name]) Signed: 03/26/2023 4:46:50 PM By: Karie Schwalbe RN Entered By: Karie Schwalbe on 03/26/2023 12:43:52 -------------------------------------------------------------------------------- Patient/Caregiver Education Details Patient Name: Date of Service: [name] [name] 5/8/2024andnbsp12:30 PM Medical Record Number: [id number] Patient Account Number: [id number] Date of Birth/Gender: Treating RN: 1961/04/01 (62 y.o. Dianna Limbo Primary Care Physician: [name] Other Clinician: Referring Physician: Treating Physician/Extender: Leodis Rains, [place] ([id number]) [id number] Page 7 of 8 Weeks in Treatment: 4 Education Assessment Education Provided To: Patient Education Topics  Provided Wound/Skin Impairment: Methods: Explain/Verbal Responses: Return demonstration correctly Electronic Signature([name]) Signed: 03/26/2023 4:46:50 PM By: Karie Schwalbe RN Entered By: Karie Schwalbe on 03/26/2023 13:17:07 -------------------------------------------------------------------------------- Wound Assessment Details Patient Name: Date of Service: [name], [name] 03/26/2023 12:30 PM Medical Record Number: [id number] Patient Account Number:  [id number] Date of Birth/Sex: Treating RN: 08-23-1961 (62 y.o. Dianna Limbo Primary Care Luwanna Brossman: [name] Other Clinician: Referring Thorvald Orsino: Treating Idabell Picking/Extender: Lolita Patella Weeks in Treatment: 4 Wound Status Wound Number: 3 Primary Etiology: Diabetic Wound/Ulcer of the Lower Extremity Wound Location: Left, Posterior Lower Leg Secondary Etiology: Lymphedema Wounding Event: Gradually Appeared Wound Status: Open Date Acquired: 01/18/2023 Comorbid History: Hypertension, Type II Diabetes, Gout Weeks Of Treatment: 4 Clustered Wound: No Photos Wound Measurements Length: (cm) Width: (cm) Depth: (cm) Area: (cm) Volume: (cm) 0 % Reduction in Area: 100% 0 % Reduction in Volume: 100% 0 Epithelialization: Large (67-100%) 0 Tunneling: No 0 Undermining: No Wound Description Classification: Grade 1 Wound Margin: Epibole Exudate Amount: None Present Foul Odor After Cleansing: No Slough/Fibrino Yes Wound Bed Granulation Amount: None Present (0%) Exposed Structure Necrotic Amount: None Present (0%) Fascia Exposed: No KASHMERE, COVIN [name] ([id number]) [id number] Page 8 of 8 Fat Layer (Subcutaneous Tissue) Exposed: No Tendon Exposed: No Muscle Exposed: No Joint Exposed: No Bone Exposed: No Periwound Skin Texture Texture Color No Abnormalities Noted: Yes No Abnormalities Noted: Yes Moisture Temperature / Pain No Abnormalities Noted: Yes Temperature: No Abnormality Electronic Signature([name]) Signed: 03/26/2023 4:46:50 PM By: Karie Schwalbe RN Entered By: Karie Schwalbe on 03/26/2023 13:23:14 -------------------------------------------------------------------------------- Vitals Details Patient Name: Date of Service: [name] [name]. 03/26/2023 12:30 PM Medical Record Number: [id number] Patient Account Number: [id number] Date of Birth/Sex: Treating RN: 05-26-1961 (62 y.o. Dianna Limbo Primary Care Aden Sek: [name] Other Clinician: Referring Antino Mayabb: Treating Viggo Perko/Extender: Cleone Slim in Treatment: 4 Vital Signs Time Taken: 12:43 Temperature (F): 98.1 Height (in): 65 Pulse (bpm): 77 Weight (lbs): 240 Respiratory Rate (breaths/min): 18 Body Mass Index (BMI): 39.9 Blood Pressure (mmHg): 123/85 Reference Range: 80 - 120 mg / dl Electronic Signature([name]) Signed: 03/26/2023 4:46:50 PM By: Karie Schwalbe RN Entered By: Karie Schwalbe on 03/26/2023 12:43:45

## 2023-03-28 DIAGNOSIS — E1140 Type 2 diabetes mellitus with diabetic neuropathy, unspecified: Secondary | ICD-10-CM | POA: Diagnosis not present

## 2023-03-28 DIAGNOSIS — I872 Venous insufficiency (chronic) (peripheral): Secondary | ICD-10-CM | POA: Diagnosis not present

## 2023-03-28 DIAGNOSIS — M7989 Other specified soft tissue disorders: Secondary | ICD-10-CM | POA: Diagnosis not present

## 2023-03-28 DIAGNOSIS — F70 Mild intellectual disabilities: Secondary | ICD-10-CM | POA: Diagnosis not present

## 2023-03-28 DIAGNOSIS — M79606 Pain in leg, unspecified: Secondary | ICD-10-CM | POA: Diagnosis not present

## 2023-03-28 DIAGNOSIS — I10 Essential (primary) hypertension: Secondary | ICD-10-CM | POA: Diagnosis not present

## 2023-03-28 DIAGNOSIS — E1151 Type 2 diabetes mellitus with diabetic peripheral angiopathy without gangrene: Secondary | ICD-10-CM | POA: Diagnosis not present

## 2023-03-28 DIAGNOSIS — I2510 Atherosclerotic heart disease of native coronary artery without angina pectoris: Secondary | ICD-10-CM | POA: Diagnosis not present

## 2023-03-28 DIAGNOSIS — L0390 Cellulitis, unspecified: Secondary | ICD-10-CM | POA: Diagnosis not present

## 2023-03-31 ENCOUNTER — Ambulatory Visit: Payer: Self-pay

## 2023-03-31 NOTE — Patient Outreach (Signed)
  Care Coordination   Follow Up Visit Note   03/31/2023 Name: [name] MRN: [id number] DOB: Jul 04, 1961  [name] is a 62 y.o. year old male who sees [name], [place] for primary care. I spoke with  [name] by phone today.  What matters to the patients health and wellness today?  Managing health    Goals Addressed             This Visit's Progress    Diabetes Management       Patient Goals/Self Care Activities: -check blood sugar at prescribed times -record values and write them down take them to all doctor visits   Patient being seen by the wound center and  home health.  Left leg wound is healing per patient.  He denies signs of infection.   Patient blood sugar last check at 186.  Reiterated importance of diabetes management and what type of foods to eat. He verbalized understanding.         SDOH assessments and interventions completed:  Yes     Care Coordination Interventions:  Yes, provided   Follow up plan: Follow up call scheduled for June    Encounter Outcome:  Pt. Visit Completed   Bary Leriche, RN, MSN [facility] Care Management Care Management Coordinator Direct Line [phone]

## 2023-03-31 NOTE — Patient Instructions (Signed)
Visit Information  Thank you for taking time to visit with me today. Please don't hesitate to contact me if I can be of assistance to you.   Following are the goals we discussed today:   Goals Addressed             This Visit's Progress    Diabetes Management       Patient Goals/Self Care Activities: -check blood sugar at prescribed times -record values and write them down take them to all doctor visits   Patient being seen by the wound center and  home health.  Left leg wound is healing per patient.  He denies signs of infection.   Patient blood sugar last check at 186.  Reiterated importance of diabetes management and what type of foods to eat. He verbalized understanding.         Our next appointment is by telephone on 05/05/23 at 0930  Please call the care guide team at [phone] if you need to cancel or reschedule your appointment.   If you are experiencing a Mental Health or Behavioral Health Crisis or need someone to talk to, please call the Suicide and Crisis Lifeline: 988   The patient verbalized understanding of instructions, educational materials, and care plan provided today and DECLINED offer to receive copy of patient instructions, educational materials, and care plan.   The patient has been provided with contact information for the care management team and has been advised to call with any health related questions or concerns.   Bary Leriche, RN, MSN [facility] Care Management Care Management Coordinator Direct Line [phone]

## 2023-04-01 ENCOUNTER — Encounter: Payer: Self-pay | Admitting: *Deleted

## 2023-04-02 ENCOUNTER — Encounter (HOSPITAL_BASED_OUTPATIENT_CLINIC_OR_DEPARTMENT_OTHER): Payer: Medicare HMO | Admitting: Physician Assistant

## 2023-04-02 DIAGNOSIS — I890 Lymphedema, not elsewhere classified: Secondary | ICD-10-CM | POA: Diagnosis not present

## 2023-04-02 DIAGNOSIS — L97822 Non-pressure chronic ulcer of other part of left lower leg with fat layer exposed: Secondary | ICD-10-CM | POA: Diagnosis not present

## 2023-04-02 DIAGNOSIS — L03116 Cellulitis of left lower limb: Secondary | ICD-10-CM | POA: Diagnosis not present

## 2023-04-02 DIAGNOSIS — E11622 Type 2 diabetes mellitus with other skin ulcer: Secondary | ICD-10-CM | POA: Diagnosis not present

## 2023-04-02 DIAGNOSIS — Z7901 Long term (current) use of anticoagulants: Secondary | ICD-10-CM | POA: Diagnosis not present

## 2023-04-02 DIAGNOSIS — M109 Gout, unspecified: Secondary | ICD-10-CM | POA: Diagnosis not present

## 2023-04-02 DIAGNOSIS — L97228 Non-pressure chronic ulcer of left calf with other specified severity: Secondary | ICD-10-CM | POA: Diagnosis not present

## 2023-04-02 DIAGNOSIS — E785 Hyperlipidemia, unspecified: Secondary | ICD-10-CM | POA: Diagnosis not present

## 2023-04-02 DIAGNOSIS — I10 Essential (primary) hypertension: Secondary | ICD-10-CM | POA: Diagnosis not present

## 2023-04-02 DIAGNOSIS — E1165 Type 2 diabetes mellitus with hyperglycemia: Secondary | ICD-10-CM | POA: Diagnosis not present

## 2023-04-03 ENCOUNTER — Telehealth: Payer: Self-pay | Admitting: *Deleted

## 2023-04-03 DIAGNOSIS — L0390 Cellulitis, unspecified: Secondary | ICD-10-CM | POA: Diagnosis not present

## 2023-04-03 DIAGNOSIS — E1140 Type 2 diabetes mellitus with diabetic neuropathy, unspecified: Secondary | ICD-10-CM | POA: Diagnosis not present

## 2023-04-03 DIAGNOSIS — I10 Essential (primary) hypertension: Secondary | ICD-10-CM | POA: Diagnosis not present

## 2023-04-03 DIAGNOSIS — I2510 Atherosclerotic heart disease of native coronary artery without angina pectoris: Secondary | ICD-10-CM | POA: Diagnosis not present

## 2023-04-03 DIAGNOSIS — E1151 Type 2 diabetes mellitus with diabetic peripheral angiopathy without gangrene: Secondary | ICD-10-CM | POA: Diagnosis not present

## 2023-04-03 DIAGNOSIS — M79606 Pain in leg, unspecified: Secondary | ICD-10-CM | POA: Diagnosis not present

## 2023-04-03 DIAGNOSIS — I872 Venous insufficiency (chronic) (peripheral): Secondary | ICD-10-CM | POA: Diagnosis not present

## 2023-04-03 DIAGNOSIS — F70 Mild intellectual disabilities: Secondary | ICD-10-CM | POA: Diagnosis not present

## 2023-04-03 DIAGNOSIS — M7989 Other specified soft tissue disorders: Secondary | ICD-10-CM | POA: Diagnosis not present

## 2023-04-03 NOTE — Patient Outreach (Signed)
Late Entry Care Coordination   04/03/2023 Name: MUZAMIL KAWECKI MRN: [id number] DOB: Aug 26, 1961   Care Coordination Outreach Attempts:  An unsuccessful telephone outreach was attempted today to offer the patient information about available care coordination services.  Follow Up Plan:  Additional outreach attempts will be made to offer the patient care coordination information and services.   Encounter Outcome:  No Answer   Care Coordination Interventions:  Yes, provided    Reece Levy, MSW, LCSW Clinical Social Worker [employer] [phone]

## 2023-04-04 DIAGNOSIS — M79606 Pain in leg, unspecified: Secondary | ICD-10-CM | POA: Diagnosis not present

## 2023-04-04 DIAGNOSIS — Z6841 Body Mass Index (BMI) 40.0 and over, adult: Secondary | ICD-10-CM | POA: Diagnosis not present

## 2023-04-04 DIAGNOSIS — R600 Localized edema: Secondary | ICD-10-CM | POA: Diagnosis not present

## 2023-04-04 DIAGNOSIS — E1140 Type 2 diabetes mellitus with diabetic neuropathy, unspecified: Secondary | ICD-10-CM | POA: Diagnosis not present

## 2023-04-07 DIAGNOSIS — E1151 Type 2 diabetes mellitus with diabetic peripheral angiopathy without gangrene: Secondary | ICD-10-CM | POA: Diagnosis not present

## 2023-04-07 DIAGNOSIS — Z4800 Encounter for change or removal of nonsurgical wound dressing: Secondary | ICD-10-CM | POA: Diagnosis not present

## 2023-04-07 DIAGNOSIS — I872 Venous insufficiency (chronic) (peripheral): Secondary | ICD-10-CM | POA: Diagnosis not present

## 2023-04-07 DIAGNOSIS — L97221 Non-pressure chronic ulcer of left calf limited to breakdown of skin: Secondary | ICD-10-CM | POA: Diagnosis not present

## 2023-04-08 DIAGNOSIS — L97221 Non-pressure chronic ulcer of left calf limited to breakdown of skin: Secondary | ICD-10-CM | POA: Diagnosis not present

## 2023-04-08 DIAGNOSIS — E1140 Type 2 diabetes mellitus with diabetic neuropathy, unspecified: Secondary | ICD-10-CM | POA: Diagnosis not present

## 2023-04-08 DIAGNOSIS — M79606 Pain in leg, unspecified: Secondary | ICD-10-CM | POA: Diagnosis not present

## 2023-04-08 DIAGNOSIS — I10 Essential (primary) hypertension: Secondary | ICD-10-CM | POA: Diagnosis not present

## 2023-04-08 DIAGNOSIS — E1151 Type 2 diabetes mellitus with diabetic peripheral angiopathy without gangrene: Secondary | ICD-10-CM | POA: Diagnosis not present

## 2023-04-08 DIAGNOSIS — M7989 Other specified soft tissue disorders: Secondary | ICD-10-CM | POA: Diagnosis not present

## 2023-04-08 DIAGNOSIS — I872 Venous insufficiency (chronic) (peripheral): Secondary | ICD-10-CM | POA: Diagnosis not present

## 2023-04-08 DIAGNOSIS — Z4800 Encounter for change or removal of nonsurgical wound dressing: Secondary | ICD-10-CM | POA: Diagnosis not present

## 2023-04-08 DIAGNOSIS — I2510 Atherosclerotic heart disease of native coronary artery without angina pectoris: Secondary | ICD-10-CM | POA: Diagnosis not present

## 2023-04-16 DIAGNOSIS — M7989 Other specified soft tissue disorders: Secondary | ICD-10-CM | POA: Diagnosis not present

## 2023-04-16 DIAGNOSIS — Z4800 Encounter for change or removal of nonsurgical wound dressing: Secondary | ICD-10-CM | POA: Diagnosis not present

## 2023-04-16 DIAGNOSIS — E1151 Type 2 diabetes mellitus with diabetic peripheral angiopathy without gangrene: Secondary | ICD-10-CM | POA: Diagnosis not present

## 2023-04-16 DIAGNOSIS — I872 Venous insufficiency (chronic) (peripheral): Secondary | ICD-10-CM | POA: Diagnosis not present

## 2023-04-16 DIAGNOSIS — M79606 Pain in leg, unspecified: Secondary | ICD-10-CM | POA: Diagnosis not present

## 2023-04-16 DIAGNOSIS — I2510 Atherosclerotic heart disease of native coronary artery without angina pectoris: Secondary | ICD-10-CM | POA: Diagnosis not present

## 2023-04-16 DIAGNOSIS — L97221 Non-pressure chronic ulcer of left calf limited to breakdown of skin: Secondary | ICD-10-CM | POA: Diagnosis not present

## 2023-04-16 DIAGNOSIS — E1140 Type 2 diabetes mellitus with diabetic neuropathy, unspecified: Secondary | ICD-10-CM | POA: Diagnosis not present

## 2023-04-16 DIAGNOSIS — I10 Essential (primary) hypertension: Secondary | ICD-10-CM | POA: Diagnosis not present

## 2023-04-17 ENCOUNTER — Ambulatory Visit: Payer: Self-pay | Admitting: *Deleted

## 2023-04-17 NOTE — Patient Instructions (Signed)
Visit Information  Thank you for taking time to visit with me today. Please don't hesitate to contact me if I can be of assistance to you.   Following are the goals we discussed today:   Goals Addressed             This Visit's Progress    Transportation       Goals:       Continue to use the GTA/SCAT for transportation to MD appointments, gym, etc Keep the # provided to you in your wallet to schedule rides 1-7 days ahead.  Call # provided to cancel any rides at least 1 hour before pickup time or you will be a NO SHOW which will leade to being ineligible if happens too many times Go to their office and get purchase a pass as discussed- $56 for 40 rides (will be cheaper than the bus fare)  Call your insurance provider for more information about your Enhanced Benefits (is life alert covered?) Make sure to order from [facility] catalog before end of month for this quarterly credit to shop Expect the Compression Hose ordered to be shipped to you asap- currently on back order per report today                     Our next appointment is by telephone on 04/30/23  Please call the care guide team at [phone] if you need to cancel or reschedule your appointment.   If you are experiencing a Mental Health or Behavioral Health Crisis or need someone to talk to, please call the Suicide and Crisis Lifeline: 988 call 911   The patient verbalized understanding of instructions, educational materials, and care plan provided today and DECLINED offer to receive copy of patient instructions, educational materials, and care plan.   Telephone follow up appointment with care management team member scheduled for:04/30/23  Reece Levy, MSW, LCSW Clinical Social Worker [employer] [phone]

## 2023-04-17 NOTE — Patient Outreach (Signed)
  Care Coordination   Follow Up Visit Note   04/17/2023 Name: [name] MRN: [id number] DOB: Apr 04, 1961  [name] is a 62 y.o. year old male who sees [name], [place] for primary care. I spoke with  [name] by phone today.  What matters to the patients health and wellness today?  "I am waiting on my leg brace like the one I have for the other leg"    Goals Addressed             This Visit's Progress    Transportation       Goals:       Continue to use the GTA/SCAT for transportation to MD appointments, gym, etc Keep the # provided to you in your wallet to schedule rides 1-7 days ahead.  Call # provided to cancel any rides at least 1 hour before pickup time or you will be a NO SHOW which will leade to being ineligible if happens too many times Go to their office and get purchase a pass as discussed- $56 for 40 rides (will be cheaper than the bus fare)  Call your insurance provider for more information about your Enhanced Benefits (is life alert covered?) Make sure to order from [facility] catalog before end of month for this quarterly credit to shop Expect the Compression Hose ordered to be shipped to you asap- currently on back order per report today                     SDOH assessments and interventions completed:  Yes     Care Coordination Interventions:  Yes, provided  Interventions Today    Flowsheet Row Most Recent Value  Chronic Disease   Chronic disease during today's visit Diabetes  General Interventions   General Interventions Discussed/Reviewed [name], Horticulturist, commercial (DME)  [Pt inquiring about "Leg brace" for one of his legs- thinks it was ordered by [facility]- CSW called Wound Center and was told it is Compression Hose and they are on back order but should ship to him once available- CSW updated pt]  Exercise Interventions   Exercise Discussed/Reviewed Exercise Discussed   [continue with your gym activity as  able]  Education Interventions   Provided Verbal Education On [name]  [Pt reports his SCAT/GTA rides are working "very well"]       Follow up plan: Follow up call scheduled for 04/30/23    Encounter Outcome:  Pt. Visit Completed

## 2023-04-22 DIAGNOSIS — L97221 Non-pressure chronic ulcer of left calf limited to breakdown of skin: Secondary | ICD-10-CM | POA: Diagnosis not present

## 2023-04-22 DIAGNOSIS — E1151 Type 2 diabetes mellitus with diabetic peripheral angiopathy without gangrene: Secondary | ICD-10-CM | POA: Diagnosis not present

## 2023-04-22 DIAGNOSIS — I872 Venous insufficiency (chronic) (peripheral): Secondary | ICD-10-CM | POA: Diagnosis not present

## 2023-04-22 DIAGNOSIS — M79606 Pain in leg, unspecified: Secondary | ICD-10-CM | POA: Diagnosis not present

## 2023-04-22 DIAGNOSIS — E1140 Type 2 diabetes mellitus with diabetic neuropathy, unspecified: Secondary | ICD-10-CM | POA: Diagnosis not present

## 2023-04-22 DIAGNOSIS — I2510 Atherosclerotic heart disease of native coronary artery without angina pectoris: Secondary | ICD-10-CM | POA: Diagnosis not present

## 2023-04-22 DIAGNOSIS — I10 Essential (primary) hypertension: Secondary | ICD-10-CM | POA: Diagnosis not present

## 2023-04-22 DIAGNOSIS — M7989 Other specified soft tissue disorders: Secondary | ICD-10-CM | POA: Diagnosis not present

## 2023-04-22 DIAGNOSIS — Z4800 Encounter for change or removal of nonsurgical wound dressing: Secondary | ICD-10-CM | POA: Diagnosis not present

## 2023-04-23 DIAGNOSIS — I890 Lymphedema, not elsewhere classified: Secondary | ICD-10-CM | POA: Diagnosis not present

## 2023-04-28 DIAGNOSIS — L97221 Non-pressure chronic ulcer of left calf limited to breakdown of skin: Secondary | ICD-10-CM | POA: Diagnosis not present

## 2023-04-28 DIAGNOSIS — E1151 Type 2 diabetes mellitus with diabetic peripheral angiopathy without gangrene: Secondary | ICD-10-CM | POA: Diagnosis not present

## 2023-04-28 DIAGNOSIS — E1140 Type 2 diabetes mellitus with diabetic neuropathy, unspecified: Secondary | ICD-10-CM | POA: Diagnosis not present

## 2023-04-28 DIAGNOSIS — M7989 Other specified soft tissue disorders: Secondary | ICD-10-CM | POA: Diagnosis not present

## 2023-04-28 DIAGNOSIS — M79606 Pain in leg, unspecified: Secondary | ICD-10-CM | POA: Diagnosis not present

## 2023-04-28 DIAGNOSIS — I2510 Atherosclerotic heart disease of native coronary artery without angina pectoris: Secondary | ICD-10-CM | POA: Diagnosis not present

## 2023-04-28 DIAGNOSIS — I872 Venous insufficiency (chronic) (peripheral): Secondary | ICD-10-CM | POA: Diagnosis not present

## 2023-04-28 DIAGNOSIS — Z4800 Encounter for change or removal of nonsurgical wound dressing: Secondary | ICD-10-CM | POA: Diagnosis not present

## 2023-04-28 DIAGNOSIS — I10 Essential (primary) hypertension: Secondary | ICD-10-CM | POA: Diagnosis not present

## 2023-04-30 ENCOUNTER — Ambulatory Visit: Payer: Self-pay | Admitting: *Deleted

## 2023-04-30 NOTE — Patient Instructions (Signed)
Visit Information  Thank you for taking time to visit with me today. Please don't hesitate to contact me if I can be of assistance to you.   Following are the goals we discussed today:   Goals Addressed             This Visit's Progress    Transportation       Goals:       Continue to use the GTA/SCAT for transportation to MD appointments, gym, etc Keep the # provided to you in your wallet to schedule rides 1-7 days ahead.  Call # provided to cancel any rides at least 1 hour before pickup time or you will be a NO SHOW which will leade to being ineligible if happens too many times Go to their office and get purchase a pass as discussed- $56 for 40 rides (will be cheaper than the bus fare)  Call your insurance provider for more information about your Enhanced Benefits (is life alert covered?) Make sure to order from [facility] catalog before end of month for this quarterly credit to shop Glad you received your brace/package Reach out to [facility]acy[facility]  or PCP for refills on all meds before they run out                     Our next appointment is by telephone on 05/28/23  Please call the care guide team at [phone] if you need to cancel or reschedule your appointment.   If you are experiencing a Mental Health or Behavioral Health Crisis or need someone to talk to, please call the Suicide and Crisis Lifeline: 988 call 911   The patient verbalized understanding of instructions, educational materials, and care plan provided today and DECLINED offer to receive copy of patient instructions, educational materials, and care plan.   Telephone follow up appointment with care management team member scheduled for: 05/28/23  Reece Levy, MSW, LCSW Clinical Social Worker [employer] [phone]

## 2023-04-30 NOTE — Patient Outreach (Signed)
  Care Coordination   Follow Up Visit Note   04/30/2023 Name: [name] MRN: [id number] DOB: 1961-05-30  [name] is a 62 y.o. year old male who sees [name], [place] for primary care. I spoke with  [name] by phone today.  What matters to the patients health and wellness today?  Pt pleased he received his package including leg brace,etc. Also, pt concerned he will run out of his Colchicine soon-     Goals Addressed             This Visit's Progress    Transportation       Goals:       Continue to use the GTA/SCAT for transportation to MD appointments, gym, etc Keep the # provided to you in your wallet to schedule rides 1-7 days ahead.  Call # provided to cancel any rides at least 1 hour before pickup time or you will be a NO SHOW which will leade to being ineligible if happens too many times Go to their office and get purchase a pass as discussed- $56 for 40 rides (will be cheaper than the bus fare)  Call your insurance provider for more information about your Enhanced Benefits (is life alert covered?) Make sure to order from [facility] catalog before end of month for this quarterly credit to shop Glad you received your brace/package Reach out to Pharmacy/CVS  or PCP for refills on all meds before they run out                     SDOH assessments and interventions completed:  Yes     Care Coordination Interventions:  Yes, provided  Interventions Today    Flowsheet Row Most Recent Value  Chronic Disease   Chronic disease during today's visit Diabetes  General Interventions   General Interventions Discussed/Reviewed General Interventions Discussed, Durable Medical Equipment (DME), [name], Doctor Visits  [Pt continues to use GTA/SCAT and reports it is going "very well" and also attending the [facility]]  Durable Medical Equipment (DME) Other  [pt reports received his brace and other items]  Pharmacy Interventions   Pharmacy Dicussed/Reviewed  Pharmacy Topics Discussed  [Pt reports his Colchicine RX is about to run out- reminded pt to [facility] and/or PCP if problem getting it through [facility]]       Follow up plan: Follow up call scheduled for 05/28/23    Encounter Outcome:  Pt. Visit Completed

## 2023-05-05 ENCOUNTER — Ambulatory Visit: Payer: Self-pay

## 2023-05-05 NOTE — Patient Outreach (Signed)
  Care Coordination   05/05/2023 Name: [name] MRN: [id number] DOB: 1961-05-13   Care Coordination Outreach Attempts:  An unsuccessful telephone outreach was attempted today to offer the patient information about available care coordination services.  Follow Up Plan:  Additional outreach attempts will be made to offer the patient care coordination information and services.   Encounter Outcome:  No Answer   Care Coordination Interventions:  No, not indicated    Bary Leriche, RN, MSN [facility] Care Management Care Management Coordinator Direct Line [phone]

## 2023-05-07 ENCOUNTER — Telehealth: Payer: Self-pay

## 2023-05-07 NOTE — Patient Outreach (Signed)
  Care Coordination   05/07/2023 Name: JANSIEL WILKE MRN: [id number] DOB: 08-Dec-1960   Care Coordination Outreach Attempts:  An unsuccessful telephone outreach was attempted today to offer the patient information about available care coordination services.  Follow Up Plan:  Additional outreach attempts will be made to offer the patient care coordination information and services.   Encounter Outcome:  No Answer   Care Coordination Interventions:  No, not indicated    Bary Leriche, RN, MSN [facility] Care Management Care Management Coordinator Direct Line [phone]

## 2023-05-12 ENCOUNTER — Telehealth: Payer: Self-pay

## 2023-05-12 NOTE — Patient Outreach (Signed)
  Care Coordination   05/12/2023 Name: [name] MRN: [id number] DOB: 1961/04/25   Care Coordination Outreach Attempts:  A third unsuccessful outreach was attempted today to offer the patient with information about available care coordination services.  Follow Up Plan:  Additional outreach attempts will be made to offer the patient care coordination information and services.   Encounter Outcome:  No Answer   Care Coordination Interventions:  No, not indicated    Bary Leriche, RN, MSN [facility] Care Management Care Management Coordinator Direct Line [phone]

## 2023-05-13 DIAGNOSIS — M79606 Pain in leg, unspecified: Secondary | ICD-10-CM | POA: Diagnosis not present

## 2023-05-13 DIAGNOSIS — M7989 Other specified soft tissue disorders: Secondary | ICD-10-CM | POA: Diagnosis not present

## 2023-05-13 DIAGNOSIS — I872 Venous insufficiency (chronic) (peripheral): Secondary | ICD-10-CM | POA: Diagnosis not present

## 2023-05-13 DIAGNOSIS — L97221 Non-pressure chronic ulcer of left calf limited to breakdown of skin: Secondary | ICD-10-CM | POA: Diagnosis not present

## 2023-05-13 DIAGNOSIS — I2510 Atherosclerotic heart disease of native coronary artery without angina pectoris: Secondary | ICD-10-CM | POA: Diagnosis not present

## 2023-05-13 DIAGNOSIS — E1140 Type 2 diabetes mellitus with diabetic neuropathy, unspecified: Secondary | ICD-10-CM | POA: Diagnosis not present

## 2023-05-13 DIAGNOSIS — I10 Essential (primary) hypertension: Secondary | ICD-10-CM | POA: Diagnosis not present

## 2023-05-13 DIAGNOSIS — E1151 Type 2 diabetes mellitus with diabetic peripheral angiopathy without gangrene: Secondary | ICD-10-CM | POA: Diagnosis not present

## 2023-05-13 DIAGNOSIS — Z4800 Encounter for change or removal of nonsurgical wound dressing: Secondary | ICD-10-CM | POA: Diagnosis not present

## 2023-05-14 ENCOUNTER — Ambulatory Visit: Payer: Self-pay | Admitting: *Deleted

## 2023-05-14 NOTE — Patient Instructions (Signed)
  Visit Information  Thank you for taking time to visit with me today. Please don't hesitate to contact me if I can be of assistance to you.   Following are the goals we discussed today:   Goals Addressed             This Visit's Progress    COMPLETED: Transportation       Goals:       Continue to use the GTA/SCAT for transportation to MD appointments, gym, etc Keep the # provided to you in your wallet to schedule rides 1-7 days ahead.  Call # provided to cancel any rides at least 1 hour before pickup time or you will be a NO SHOW which will leade to being ineligible if happens too many times Go to their office and get purchase a pass as discussed- $56 for 40 rides (will be cheaper than the bus fare)  Call your insurance provider for more information about your Enhanced Benefits (is life alert covered?) Make sure to order from [facility] catalog before end of month for this quarterly credit to shop Glad you received your brace/package Reach out to Pharmacy/CVS  or PCP for refills on all meds before they run out                      Please call the care guide team at [phone] if you need to reconnect with me.   If you are experiencing a Mental Health or Behavioral Health Crisis or need someone to talk to, please call the Suicide and Crisis Lifeline: 988 call 911   The patient verbalized understanding of instructions, educational materials, and care plan provided today and DECLINED offer to receive copy of patient instructions, educational materials, and care plan.   Telephone follow up appointment with care management team member scheduled for: No further follow up required:    Reece Levy, MSW, LCSW Clinical Social Worker [employer] [phone]

## 2023-05-14 NOTE — Patient Outreach (Signed)
  Care Coordination   Follow Up Visit Note   05/14/2023 Name: [name] MRN: [id number] DOB: [birth year]  [name] is a 62 y.o. year old male who sees [name], [place] for primary care. I spoke with  [name] by phone today.  What matters to the patients health and wellness today?  Pt reports all is going well with his transportation, insurance benefits, exercise and overall health/mental health wellness. Denies any further needs from CSW at this time. Reminded to reach out or have PCP place referral if other needs arise.    Goals Addressed             This Visit's Progress    COMPLETED: Transportation       Goals:       Continue to use the GTA/SCAT for transportation to MD appointments, gym, etc Keep the # provided to you in your wallet to schedule rides 1-7 days ahead.  Call # provided to cancel any rides at least 1 hour before pickup time or you will be a NO SHOW which will leade to being ineligible if happens too many times Go to their office and get purchase a pass as discussed- $56 for 40 rides (will be cheaper than the bus fare)  Call your insurance provider for more information about your Enhanced Benefits (is life alert covered?) Make sure to order from [facility] catalog before end of month for this quarterly credit to shop Glad you received your brace/package Reach out to Pharmacy/CVS  or PCP for refills on all meds before they run out                     SDOH assessments and interventions completed:  Yes  SDOH Interventions Today    Flowsheet Row Most Recent Value  SDOH Interventions   Transportation Interventions SCAT ([employer]), Payor Benefit        Care Coordination Interventions:  Yes, provided  Interventions Today    Flowsheet Row Most Recent Value  General Interventions   General Interventions Discussed/Reviewed [name]  [Pt continues to use SCAT and indicates it is going well. He is  also linked with the Y]  Mental Health Interventions   Mental Health Discussed/Reviewed Mental Health Discussed, Other  [Pt in good spirits- doing well,  staying active and denies any concerns or needs]       Follow up plan: No further intervention required.   Encounter Outcome:  Pt. Visit Completed

## 2023-05-16 ENCOUNTER — Telehealth: Payer: Self-pay

## 2023-05-16 NOTE — Patient Outreach (Signed)
  Care Coordination   Follow Up Visit Note   05/16/2023 Name: [name] MRN: [id number] DOB: 05-Aug-1961  [name] is a 62 y.o. year old male who sees [name], [place] for primary care. I spoke with  [name] by phone today.  What matters to the patients health and wellness today?  Diabetes management    Goals Addressed             This Visit's Progress    Diabetes Management       Patient Goals/Self Care Activities: -check blood sugar at prescribed times -record values and write them down take them to all doctor visits   Patient been discharge by the wound center.     Patient blood sugar last check at 107.  Reinforced importance of diabetes management and what type of foods to eat. He verbalized understanding.         SDOH assessments and interventions completed:  Yes  SDOH Interventions Today    Flowsheet Row Most Recent Value  SDOH Interventions   Housing Interventions Intervention Not Indicated  Transportation Interventions Intervention Not Indicated  Utilities Interventions Intervention Not Indicated        Care Coordination Interventions:  Yes, provided   Follow up plan: Follow up call scheduled for August    Encounter Outcome:  Pt. Visit Completed   [name] Leriche, RN, MSN [facility] Care Management Care Management Coordinator Direct Line [phone]

## 2023-05-16 NOTE — Patient Instructions (Signed)
Visit Information  Thank you for taking time to visit with me today. Please don't hesitate to contact me if I can be of assistance to you.   Following are the goals we discussed today:   Goals Addressed             This Visit's Progress    Diabetes Management       Patient Goals/Self Care Activities: -check blood sugar at prescribed times -record values and write them down take them to all doctor visits   Patient been discharge by the wound center.     Patient blood sugar last check at 107.  Reinforced importance of diabetes management and what type of foods to eat. He verbalized understanding.         Our next appointment is by telephone on 07/02/23 at 930 am  Please call the care guide team at [phone] if you need to cancel or reschedule your appointment.   If you are experiencing a Mental Health or Behavioral Health Crisis or need someone to talk to, please call the Suicide and Crisis Lifeline: 988   The patient verbalized understanding of instructions, educational materials, and care plan provided today and DECLINED offer to receive copy of patient instructions, educational materials, and care plan.   The patient has been provided with contact information for the care management team and has been advised to call with any health related questions or concerns.   Bary Leriche, RN, MSN [facility] Care Management Care Management Coordinator Direct Line [phone]

## 2023-05-16 NOTE — Patient Outreach (Signed)
  Care Coordination   05/16/2023 Name: UGONNA LAMOS MRN: [id number] DOB: 1961-09-26   Care Coordination Outreach Attempts:  An unsuccessful telephone outreach was attempted today to offer the patient information about available care coordination services.  Follow Up Plan:  Additional outreach attempts will be made to offer the patient care coordination information and services.   Encounter Outcome:  No Answer   Care Coordination Interventions:  No, not indicated    Bary Leriche, RN, MSN [facility] Care Management Care Management Coordinator Direct Line [phone]

## 2023-05-20 ENCOUNTER — Telehealth: Payer: Self-pay

## 2023-05-20 NOTE — Patient Outreach (Signed)
  Care Coordination   Follow Up Visit Note   05/20/2023 Name: [name] MRN: [id number] DOB: [birth year]  [name] is a 62 y.o. year old male who sees [name], [place] for primary care. I  received a message from patient stating [facility] [address] and he normally [facility] [address].  [facility] [address], patient has medications  there. Ask for medication to be switched [facility] [address].  They will transfer prescriptions and meds to be ready tomorrow.  Spoke with patient to advise that [facility]s [facility] [address]. He verbalized understanding.  He is also out of Victoza.  [facility]ed [facility] [address]. It is on back order right now and they do not have a date when it will be in stock. Called to [facility] they have Victoza 1.8 mg box with 2 pens.  Called to PCP office to get order sent to [facility] [address].  CMA request call back.  [facility] [address]. Patient is not eligible for refill until September. Called patient. He states he only got one box on his last visit.  Advised [facility].  He verbalized understanding.  4:20 pm  Telephone call back to patient for follow up.  No answer.  Message left.    What matters to the patients health and wellness today?  Need Victoza    Goals Addressed   None     SDOH assessments and interventions completed:  Yes     Care Coordination Interventions:  Yes, provided   Follow up plan: Follow up call scheduled for as scheduled    Encounter Outcome:  Pt. Visit Completed   Bary Leriche, RN, MSN [facility] Care Management Care Management Coordinator Direct Line [phone]

## 2023-05-27 DIAGNOSIS — I10 Essential (primary) hypertension: Secondary | ICD-10-CM | POA: Diagnosis not present

## 2023-05-27 DIAGNOSIS — E1151 Type 2 diabetes mellitus with diabetic peripheral angiopathy without gangrene: Secondary | ICD-10-CM | POA: Diagnosis not present

## 2023-05-27 DIAGNOSIS — M7989 Other specified soft tissue disorders: Secondary | ICD-10-CM | POA: Diagnosis not present

## 2023-05-27 DIAGNOSIS — M79606 Pain in leg, unspecified: Secondary | ICD-10-CM | POA: Diagnosis not present

## 2023-05-27 DIAGNOSIS — E1140 Type 2 diabetes mellitus with diabetic neuropathy, unspecified: Secondary | ICD-10-CM | POA: Diagnosis not present

## 2023-05-27 DIAGNOSIS — I872 Venous insufficiency (chronic) (peripheral): Secondary | ICD-10-CM | POA: Diagnosis not present

## 2023-05-27 DIAGNOSIS — L97221 Non-pressure chronic ulcer of left calf limited to breakdown of skin: Secondary | ICD-10-CM | POA: Diagnosis not present

## 2023-05-27 DIAGNOSIS — I2510 Atherosclerotic heart disease of native coronary artery without angina pectoris: Secondary | ICD-10-CM | POA: Diagnosis not present

## 2023-05-27 DIAGNOSIS — Z4800 Encounter for change or removal of nonsurgical wound dressing: Secondary | ICD-10-CM | POA: Diagnosis not present

## 2023-05-28 ENCOUNTER — Ambulatory Visit: Payer: Self-pay | Admitting: *Deleted

## 2023-05-28 NOTE — Patient Outreach (Signed)
  Care Coordination   Follow Up Visit Note   05/28/2023 Name: [name] MRN: [id number] DOB: [birth year]  [name] is a 62 y.o. year old male who sees [name], [place] for primary care. I spoke with  [name] by phone today.  What matters to the patients health and wellness today?  Not able to get his diabetes medicine (on back order)- pt reports Dr Thomasena Edis ordered a substitute RX until it is available.    Goals Addressed   None     SDOH assessments and interventions completed:  Yes     Care Coordination Interventions:  Yes, provided  Interventions Today    Flowsheet Row Most Recent Value  Chronic Disease   Chronic disease during today's visit Diabetes  [Pt shares he is out of Victoza but PCP ordered a replacement/substitute until it is available. He is at drugstore now to pick it up]  General Interventions   General Interventions Discussed/Reviewed [name]  Education Interventions   Provided Verbal Education On Medication       Follow up plan: Follow up call scheduled for 06/25/23    Encounter Outcome:  Pt. Visit Completed

## 2023-05-30 ENCOUNTER — Encounter: Payer: Self-pay | Admitting: Podiatry

## 2023-05-30 ENCOUNTER — Ambulatory Visit (INDEPENDENT_AMBULATORY_CARE_PROVIDER_SITE_OTHER): Payer: Medicare HMO | Admitting: Podiatry

## 2023-05-30 DIAGNOSIS — B351 Tinea unguium: Secondary | ICD-10-CM | POA: Diagnosis not present

## 2023-05-30 DIAGNOSIS — M79674 Pain in right toe(s): Secondary | ICD-10-CM | POA: Diagnosis not present

## 2023-05-30 DIAGNOSIS — M76822 Posterior tibial tendinitis, left leg: Secondary | ICD-10-CM

## 2023-05-30 DIAGNOSIS — E1142 Type 2 diabetes mellitus with diabetic polyneuropathy: Secondary | ICD-10-CM | POA: Diagnosis not present

## 2023-05-30 DIAGNOSIS — M79675 Pain in left toe(s): Secondary | ICD-10-CM | POA: Diagnosis not present

## 2023-05-30 MED ORDER — METHYLPREDNISOLONE 4 MG PO TBPK: ORAL_TABLET | ORAL | 0 refills | Status: AC

## 2023-05-30 NOTE — Addendum Note (Signed)
Addended by: Nicholes Rough on: 05/30/2023 10:16 AM   Modules accepted: Orders, Level of Service

## 2023-05-30 NOTE — Progress Notes (Addendum)
This patient returns to my office for at risk foot care.  This patient requires this care by a professional since this patient will be at risk due to having diabetes  and kidney disease.  This patient is unable to cut nails himself since the patient cannot reach his nails.These nails are painful walking and wearing shoes.  This patient presents for at risk foot care today.  General Appearance  Alert, conversant and in no acute stress.  Vascular  Dorsalis pedis and posterior tibial  pulses are  not palpable  due to swelling. bilaterally.  Capillary return is within normal limits  bilaterally. Temperature is within normal limits  bilaterally.  Neurologic  Senn-Weinstein monofilament wire test within normal limits  bilaterally. Muscle power within normal limits bilaterally.  Nails Thick disfigured discolored nails with subungual debris  from hallux to fifth toes bilaterally. No evidence of bacterial infection or drainage bilaterally. Rams horn nails.  Orthopedic  No limitations of motion  feet .  No crepitus or effusions noted.  No bony pathology or digital deformities noted.  Skin  normotropic skin with no porokeratosis noted bilaterally.  No signs of infections or ulcers noted.     Onychomycosis  Pain in right toes  Pain in left toes  Consent was obtained for treatment procedures.   Mechanical debridement of nails 1-5  bilaterally performed with a nail nipper.  Filed with dremel without incident.    Return office visit      3 mos.               Told patient to return for periodic foot care and evaluation due to potential at risk complications.   She also presents with left posterior tibial tendinitis and inflammation.  Given the amount of pain that he is having he will benefit from a cam boot immobilization at decreasing inflammatory component associate with pain..  Cam boot was dispensed.  I will also dispense Medrol Dosepak.  He understands that the risk of elevated sugar due to his diabetes  he states under like to proceed despite the risks

## 2023-06-04 ENCOUNTER — Telehealth: Payer: Self-pay

## 2023-06-04 NOTE — Patient Outreach (Signed)
  Care Coordination   Follow Up Visit Note   06/04/2023 Name: ROMOLO SIELING MRN: [id number] DOB: May 21, 1961  BURNEY CALZADILLA is a 62 y.o. year old male who sees [name], [place] for primary care. I spoke with  [name] by phone today.  What matters to the patients health and wellness today?  Diabetes management    Goals Addressed             This Visit's Progress    Diabetes Management       Patient Goals/Self Care Activities: -check blood sugar at prescribed times -record values and write them down take them to all doctor visits   Patient doing okay.  Now on Mounjaro. Patient expressed ease in using.     Patient blood sugar last check at 121.  Discussed importance of diabetes management and what type of foods to eat. He verbalized understanding. Patient reports he has torn a ligament.  Discussed skin care to leg and foot with boot. He verbalized understanding.         SDOH assessments and interventions completed:  Yes     Care Coordination Interventions:  Yes, provided   Follow up plan: Follow up call scheduled for August    Encounter Outcome:  Pt. Visit Completed   Bary Leriche, RN, MSN [facility] Care Management Care Management Coordinator Direct Line [phone]

## 2023-06-04 NOTE — Patient Instructions (Signed)
Visit Information  Thank you for taking time to visit with me today. Please don't hesitate to contact me if I can be of assistance to you.   Following are the goals we discussed today:   Goals Addressed             This Visit's Progress    Diabetes Management       Patient Goals/Self Care Activities: -check blood sugar at prescribed times -record values and write them down take them to all doctor visits   Patient doing okay.  Now on Mounjaro. Patient expressed ease in using.     Patient blood sugar last check at 121.  Discussed importance of diabetes management and what type of foods to eat. He verbalized understanding. Patient reports he has torn a ligament.  Discussed skin care to leg and foot with boot. He verbalized understanding.         Our next appointment is by telephone on 07/02/23 at 930 am  Please call the care guide team at [phone] if you need to cancel or reschedule your appointment.   If you are experiencing a Mental Health or Behavioral Health Crisis or need someone to talk to, please call the Suicide and Crisis Lifeline: 988   The patient verbalized understanding of instructions, educational materials, and care plan provided today and DECLINED offer to receive copy of patient instructions, educational materials, and care plan.   The patient has been provided with contact information for the care management team and has been advised to call with any health related questions or concerns.   Bary Leriche, RN, MSN [facility] Care Management Care Management Coordinator Direct Line [phone]

## 2023-06-23 DIAGNOSIS — I10 Essential (primary) hypertension: Secondary | ICD-10-CM | POA: Diagnosis not present

## 2023-06-23 DIAGNOSIS — M109 Gout, unspecified: Secondary | ICD-10-CM | POA: Diagnosis not present

## 2023-06-23 DIAGNOSIS — E1140 Type 2 diabetes mellitus with diabetic neuropathy, unspecified: Secondary | ICD-10-CM | POA: Diagnosis not present

## 2023-06-23 DIAGNOSIS — E1122 Type 2 diabetes mellitus with diabetic chronic kidney disease: Secondary | ICD-10-CM | POA: Diagnosis not present

## 2023-06-23 DIAGNOSIS — K219 Gastro-esophageal reflux disease without esophagitis: Secondary | ICD-10-CM | POA: Diagnosis not present

## 2023-06-24 ENCOUNTER — Other Ambulatory Visit: Payer: Self-pay | Admitting: Podiatry

## 2023-06-24 DIAGNOSIS — M76822 Posterior tibial tendinitis, left leg: Secondary | ICD-10-CM

## 2023-06-25 ENCOUNTER — Ambulatory Visit: Payer: Self-pay | Admitting: *Deleted

## 2023-06-25 ENCOUNTER — Telehealth: Payer: Self-pay

## 2023-06-25 NOTE — Patient Outreach (Signed)
  Care Coordination   Follow Up Visit Note   06/25/2023 Name: [name] MRN: [id number] DOB: [birth year]  [name] is a 62 y.o. year old male who sees [name], [place] for primary care. I spoke with  [name] by phone today.  What matters to the patients health and wellness today?  Can't fill my Mounjaro because insurance wont cover it.    SDOH assessments and interventions completed:  Yes     Care Coordination Interventions:  Yes, provided  Interventions Today    Flowsheet Row Most Recent Value  Chronic Disease   Chronic disease during today's visit Diabetes  [Pt voices concern that his [name] is not being covered by insurance- CSW will alert RNCM and PCP of this.]  General Interventions   General Interventions Discussed/Reviewed [name]  [Pt reports all is going well with transportation, gym, etc]  Exercise Interventions   Exercise Discussed/Reviewed Exercise Discussed       Follow up plan: Follow up call scheduled for 07/10/23    Encounter Outcome:  Pt. Visit Completed

## 2023-06-25 NOTE — Patient Outreach (Signed)
  Care Coordination   Follow Up Visit Note   06/25/2023 Name: DARTANIAN FAHRENKRUG MRN: [id number] DOB: Oct 21, 1961  BERNADETTE TIRELLA is a 62 y.o. year old male who sees [name], [place] for primary care. I spoke with  [name] by phone today.  What matters to the patients health and wellness today?  Cannot get mounjaro    Goals Addressed             This Visit's Progress    Diabetes Management       Patient Goals/Self Care Activities: -check blood sugar at prescribed times -record values and write them down take them to all doctor visits    Patient out of Mounjaro.  Insurance will not cover.  Message sent to PCP office.  Discussed diabetes management        SDOH assessments and interventions completed:  Yes     Care Coordination Interventions:  Yes, provided   Follow up plan: Follow up call scheduled for 07/02/23    Encounter Outcome:  Pt. Visit Completed   Bary Leriche, RN, MSN [facility] Care Management Care Management Coordinator Direct Line [phone]

## 2023-06-25 NOTE — Patient Instructions (Signed)
Visit Information  Thank you for taking time to visit with me today. Please don't hesitate to contact me if I can be of assistance to you.   Following are the goals we discussed today:   Goals Addressed   None     Our next appointment is by telephone on 07/10/23  Call the care guide team at [phone] if you need to cancel or reschedule your appointment.   If you are experiencing a Mental Health or Behavioral Health Crisis or need someone to talk to, please call the Suicide and Crisis Lifeline: 988 call 911   The patient verbalized understanding of instructions, educational materials, and care plan provided today and DECLINED offer to receive copy of patient instructions, educational materials, and care plan.   Telephone follow up appointment with care management team member scheduled for:07/10/23 Follow up with provider re: RX not covered  Reece Levy, MSW, LCSW Clinical Social Worker [facility] [employer] Coordination [phone]

## 2023-06-25 NOTE — Patient Instructions (Signed)
Visit Information  Thank you for taking time to visit with me today. Please don't hesitate to contact me if I can be of assistance to you.   Following are the goals we discussed today:   Goals Addressed             This Visit's Progress    Diabetes Management       Patient Goals/Self Care Activities: -check blood sugar at prescribed times -record values and write them down take them to all doctor visits    Patient out of Mounjaro.  Insurance will not cover.  Message sent to PCP office.  Discussed diabetes management        Our next appointment is by telephone on 07/02/23 at 0930  Please call the care guide team at [phone] if you need to cancel or reschedule your appointment.   If you are experiencing a Mental Health or Behavioral Health Crisis or need someone to talk to, please call the Suicide and Crisis Lifeline: 988   The patient verbalized understanding of instructions, educational materials, and care plan provided today and DECLINED offer to receive copy of patient instructions, educational materials, and care plan.   The patient has been provided with contact information for the care management team and has been advised to call with any health related questions or concerns.   Bary Leriche, RN, MSN [facility] Care Management Care Management Coordinator Direct Line [phone]

## 2023-06-27 ENCOUNTER — Other Ambulatory Visit: Payer: Self-pay

## 2023-06-27 DIAGNOSIS — M76822 Posterior tibial tendinitis, left leg: Secondary | ICD-10-CM

## 2023-06-30 DIAGNOSIS — I10 Essential (primary) hypertension: Secondary | ICD-10-CM | POA: Diagnosis not present

## 2023-06-30 DIAGNOSIS — E7800 Pure hypercholesterolemia, unspecified: Secondary | ICD-10-CM | POA: Diagnosis not present

## 2023-06-30 DIAGNOSIS — E1122 Type 2 diabetes mellitus with diabetic chronic kidney disease: Secondary | ICD-10-CM | POA: Diagnosis not present

## 2023-06-30 DIAGNOSIS — K219 Gastro-esophageal reflux disease without esophagitis: Secondary | ICD-10-CM | POA: Diagnosis not present

## 2023-06-30 DIAGNOSIS — M109 Gout, unspecified: Secondary | ICD-10-CM | POA: Diagnosis not present

## 2023-06-30 DIAGNOSIS — E1140 Type 2 diabetes mellitus with diabetic neuropathy, unspecified: Secondary | ICD-10-CM | POA: Diagnosis not present

## 2023-07-02 ENCOUNTER — Ambulatory Visit: Payer: Self-pay

## 2023-07-02 NOTE — Patient Instructions (Signed)
Visit Information  Thank you for taking time to visit with me today. Please don't hesitate to contact me if I can be of assistance to you.   Following are the goals we discussed today:   Goals Addressed             This Visit's Progress    Diabetes Management       Patient Goals/Self Care Activities: -check blood sugar at prescribed times -record values and write them down take them to all doctor visits    Patient still having some issues getting Mounjaro.  Called [facility] pharmacy.  They do have the prescription but they need to contact the insurance.  [facility] states patient did call today and that they will get back with patient when they talk with the insurance.  Discussed diabetes management        Our next appointment is by telephone on 07/30/23 at 930 am  Please call the care guide team at [phone] if you need to cancel or reschedule your appointment.   If you are experiencing a Mental Health or Behavioral Health Crisis or need someone to talk to, please call the Suicide and Crisis Lifeline: 988   The patient verbalized understanding of instructions, educational materials, and care plan provided today and DECLINED offer to receive copy of patient instructions, educational materials, and care plan.   The patient has been provided with contact information for the care management team and has been advised to call with any health related questions or concerns.   Bary Leriche, RN, MSN [facility] Care Management Care Management Coordinator Direct Line [phone]

## 2023-07-03 ENCOUNTER — Telehealth: Payer: Self-pay

## 2023-07-03 NOTE — Patient Instructions (Signed)
Visit Information  Thank you for taking time to visit with me today. Please don't hesitate to contact me if I can be of assistance to you.   Following are the goals we discussed today:   Goals Addressed             This Visit's Progress    Diabetes Management       Patient Goals/Self Care Activities: -check blood sugar at prescribed times -record values and write them down take them to all doctor visits    Patient still having some issues getting Mounjaro.  [facility]ed [facility].  They do have the prescription but they need to contact the insurance.  [facility] states [facility] did call today and that they will get back with patient when they talk with the insurance.  Discussed diabetes management.  07/03/23 spoke with patient.  He is still waiting to hear about mounjaro status from pharmacy. Patient still doing good diabetes management by watching portions, food he eats and drinks.  Blood sugar 161 this morning. Encouraged to continue diabetes management.           Our next appointment is by telephone on 07/30/23 at 930 am  Please call the care guide team at [phone] if you need to cancel or reschedule your appointment.   If you are experiencing a Mental Health or Behavioral Health Crisis or need someone to talk to, please call the Suicide and Crisis Lifeline: 988   The patient verbalized understanding of instructions, educational materials, and care plan provided today and DECLINED offer to receive copy of patient instructions, educational materials, and care plan.   The patient has been provided with contact information for the care management team and has been advised to call with any health related questions or concerns.   Bary Leriche, RN, MSN [facility] Care Management Care Management Coordinator Direct Line [phone]

## 2023-07-03 NOTE — Patient Outreach (Signed)
  Care Coordination   Follow Up Visit Note   07/03/2023 Name: [name] MRN: [id number] DOB: 12/07/1960  [name] is a 62 y.o. year old male who sees [name], [place] for primary care. I spoke with  [name] by phone today.  What matters to the patients health and wellness today?  Getting Mounjaro    Goals Addressed             This Visit's Progress    Diabetes Management       Patient Goals/Self Care Activities: -check blood sugar at prescribed times -record values and write them down take them to all doctor visits    Patient still having some issues getting Mounjaro.  [facility]ed [facility].  They do have the prescription but they need to contact the insurance.  [facility] did call today and that they will get back with patient when they talk with the insurance.  Discussed diabetes management.  07/03/23 spoke with patient.  He is still waiting to hear about mounjaro status from pharmacy. Patient still doing good diabetes management by watching portions, food he eats and drinks.  Blood sugar 161 this morning. Encouraged to continue diabetes management.           SDOH assessments and interventions completed:  Yes     Care Coordination Interventions:  Yes, provided   Follow up plan: Follow up call scheduled for September    Encounter Outcome:  Pt. Visit Completed   Bary Leriche, RN, MSN [facility] Care Management Care Management Coordinator Direct Line [phone]

## 2023-07-30 ENCOUNTER — Ambulatory Visit: Payer: Self-pay

## 2023-07-30 NOTE — Patient Instructions (Signed)
Visit Information  Thank you for taking time to visit with me today. Please don't hesitate to contact me if I can be of assistance to you.   Following are the goals we discussed today:   Goals Addressed             This Visit's Progress    Diabetes Management       Patient Goals/Self Care Activities: -check blood sugar at prescribed times -record values and write them down take them to all doctor visits    Spoke with patient.  He reports doing well.  Blood sugar last check was 132.  He has his mounjaro.  No issues with it at this time.  Patient to see physician about his left foot next month.  Discussed diabetes and diet. No concerns.          Our next appointment is by telephone on 08/27/23 at 930 am  Please call the care guide team at [phone] if you need to cancel or reschedule your appointment.   If you are experiencing a Mental Health or Behavioral Health Crisis or need someone to talk to, please call the Suicide and Crisis Lifeline: 988   The patient verbalized understanding of instructions, educational materials, and care plan provided today and DECLINED offer to receive copy of patient instructions, educational materials, and care plan.   The patient has been provided with contact information for the care management team and has been advised to call with any health related questions or concerns.   Bary Leriche, RN, MSN [facility], [facility] Management Community Coordinator Direct Dial: [phone]  Fax: [phone] Website: [email or web address]

## 2023-08-27 ENCOUNTER — Ambulatory Visit: Payer: Self-pay

## 2023-08-27 NOTE — Patient Instructions (Signed)
Visit Information  Thank you for taking time to visit with me today. Please don't hesitate to contact me if I can be of assistance to you.   Following are the goals we discussed today:   Goals Addressed             This Visit's Progress    Diabetes Management       Patient Goals/Self Care Activities: -check blood sugar at prescribed times -record values and write them down take them to all doctor visits    Spoke with patient.  He reports doing well out and about.  Blood sugar last check was 137.  Reviewed diabetes management.  Patient to see physician next week about his left foot.  No concerns.          Our next appointment is by telephone on 09/24/23 at 930 am  Please call the care guide team at [phone] if you need to cancel or reschedule your appointment.   If you are experiencing a Mental Health or Behavioral Health Crisis or need someone to talk to, please call the Suicide and Crisis Lifeline: 988   The patient verbalized understanding of instructions, educational materials, and care plan provided today and DECLINED offer to receive copy of patient instructions, educational materials, and care plan.   The patient has been provided with contact information for the care management team and has been advised to call with any health related questions or concerns.   Bary Leriche, RN, MSN [facility], [facility] Management Community Coordinator Direct Dial: [phone]  Fax: [phone] Website: [email or web address]

## 2023-08-27 NOTE — Patient Outreach (Signed)
Care Coordination   Follow Up Visit Note   08/27/2023 Name: [name] MRN: [id number] DOB: 1961/04/29  [name] is a 62 y.o. year old male who sees [name], [place] for primary care. I spoke with  [name] by phone today.  What matters to the patients health and wellness today?  Health management    Goals Addressed             This Visit's Progress    Diabetes Management       Patient Goals/Self Care Activities: -check blood sugar at prescribed times -record values and write them down take them to all doctor visits    Spoke with patient.  He reports doing well out and about.  Blood sugar last check was 137.  Reviewed diabetes management.  Patient to see physician next week about his left foot.  No concerns.          SDOH assessments and interventions completed:  Yes  SDOH Interventions Today    Flowsheet Row Most Recent Value  SDOH Interventions   Transportation Interventions Intervention Not Indicated        Care Coordination Interventions:  Yes, provided   Follow up plan: Follow up call scheduled for November    Encounter Outcome:  Patient Visit Completed   Bary Leriche, RN, MSN [place]  [facility], [facility] Management Community Coordinator Direct Dial: [phone]  Fax: [phone] Website: [email or web address]

## 2023-09-03 ENCOUNTER — Ambulatory Visit (INDEPENDENT_AMBULATORY_CARE_PROVIDER_SITE_OTHER): Payer: Medicare HMO | Admitting: Podiatry

## 2023-09-03 ENCOUNTER — Encounter: Payer: Self-pay | Admitting: Podiatry

## 2023-09-03 VITALS — Ht 65.0 in | Wt 228.0 lb

## 2023-09-03 DIAGNOSIS — E1142 Type 2 diabetes mellitus with diabetic polyneuropathy: Secondary | ICD-10-CM | POA: Diagnosis not present

## 2023-09-03 DIAGNOSIS — R600 Localized edema: Secondary | ICD-10-CM

## 2023-09-03 NOTE — Progress Notes (Signed)
Subjective:  Patient ID: [name], male    DOB: September 27, 1961,  MRN: [id number]  Chief Complaint  Patient presents with   Diabetes    Patient is here for Routine [facility]- patient has edema of lower left extremities     62 y.o. male presents with the above complaint.  Patient presents with complaint left lower leg edema.  Patient says been present for quite some time primarily the left side.  It is causing him some pain down the foot.  He wanted to discuss treatment options for edema.  He tries to wear compression socks but has not been helping him.  He does not have any open wounds or lesion.  Pain scale 7 out of 10 dull aching nature he is a type II diabetic.   Review of Systems: Negative except as noted in the HPI. Denies N/V/F/Ch.  Past Medical History:  Diagnosis Date   Gouty bursitis, elbow  10/18/2015   Hyperlipidemia    Hypertension    Knee pain, chronic    Type II diabetes mellitus (HCC)     Current Outpatient Medications:    Accu-Chek Softclix Lancets lancets, TEST BLOOD SUGAR THREE TIMES DAILY AS DIRECTED (APPOINTMENT IS NEEDED FOR REFILLS), Disp: 100 each, Rfl: 0   Alcohol Swabs (B-D SINGLE USE SWABS REGULAR) PADS, Please use as instructed before checking blood sugar and before injecting insulin., Disp: 100 each, Rfl: 11   allopurinol (ZYLOPRIM) 300 MG tablet, Take 1 tablet (300 mg total) by mouth daily., Disp: 30 tablet, Rfl: 0   amLODipine (NORVASC) 10 MG tablet, Take 1 tablet (10 mg total) by mouth daily., Disp: , Rfl:    apixaban (ELIQUIS) 5 MG TABS tablet, Take 1 tablet (5 mg total) by mouth 2 (two) times daily., Disp: 60 tablet, Rfl: 0   atorvastatin (LIPITOR) 40 MG tablet, Take 1 tablet (40 mg total) by mouth daily at 6 PM., Disp: 30 tablet, Rfl: 0   Blood Glucose Calibration (ACCU-CHEK AVIVA) SOLN, Use to calibrate your glucometer when needed., Disp: 1 each, Rfl: 0   Blood Glucose Monitoring Suppl (ACCU-CHEK AVIVA PLUS) w/Device KIT, USE AS INSTRUCTED THREE TIMES  DAILY. E11.69, Disp: 1 kit, Rfl: 0   diclofenac Sodium (VOLTAREN) 1 % GEL, APPLY 4 GRAMS TOPICALLY AS INSTRUCTED FOUR TIMES DAILY., Disp: 200 g, Rfl: 0   ferrous sulfate 325 (65 FE) MG tablet, Take 1 tablet (325 mg total) by mouth daily with breakfast. For Iron Deficiency Anemia, Disp: 30 tablet, Rfl: 0   glucose blood (ACCU-CHEK AVIVA PLUS) test strip, TEST BLOOD SUGAR THREE TIMES DAILY AS DIRECTED, Disp: 100 each, Rfl: 0   insulin aspart (NOVOLOG) 100 UNIT/ML injection, Inject 0-15 Units into the skin 3 (three) times daily with meals. CBG < 70: Implement Hypoglycemia Standing Orders and refer to Hypoglycemia Standing Orders sidebar report CBG 70 - 120: 0 units CBG 121 - 150: 2 units CBG 151 - 200: 3 units CBG 201 - 250: 5 units CBG 251 - 300: 8 units CBG 301 - 350: 11 units CBG 351 - 400: 15 units CBG > 400: call MD., Disp: , Rfl:    insulin aspart protamine- aspart (NOVOLOG MIX 70/30) (70-30) 100 UNIT/ML injection, Inject 0.5 mLs (50 Units total) into the skin 2 (two) times daily with a meal., Disp: 10 mL, Rfl: 6   insulin glargine (LANTUS) 100 UNIT/ML injection, Inject 50 Units into the skin daily., Disp: , Rfl:    JARDIANCE 25 MG TABS tablet, 1 tablet Orally Once a  day for diabetes for 90 days, Disp: , Rfl:    liraglutide (VICTOZA) 18 MG/3ML SOPN, INJECT 1.8 MG INTO THE SKIN DAILY., Disp: 27 mL, Rfl: 1   Magnesium Oxide 200 MG TABS, Take 2 tablets (400 mg total) by mouth daily. Take 2 tabs to = 400 mg, Disp: 30 tablet, Rfl: 0   methylPREDNISolone (MEDROL DOSEPAK) 4 MG TBPK tablet, Take as directed, Disp: 21 each, Rfl: 0   metoprolol succinate (TOPROL-XL) 50 MG 24 hr tablet, Take 1.5 tablets (75 mg total) by mouth daily. Take with or immediately following a meal., Disp: , Rfl:    Multiple Vitamin (MULTIVITAMIN WITH MINERALS) TABS tablet, Take 1 tablet by mouth daily., Disp: , Rfl:    Nutritional Supplements (,FEEDING SUPPLEMENT, PROSOURCE PLUS) liquid, Take 30 mLs by mouth 2 (two) times daily  between meals., Disp: , Rfl:    Nutritional Supplements (FEEDING SUPPLEMENT, KATE FARMS STANDARD 1.4,) LIQD liquid, Take 325 mLs by mouth daily., Disp: , Rfl:    pantoprazole (PROTONIX) 40 MG tablet, Take 1 tablet (40 mg total) by mouth daily., Disp: , Rfl:    polyethylene glycol (MIRALAX / GLYCOLAX) 17 g packet, Take 17 g by mouth daily., Disp: , Rfl:   Social History   Tobacco Use  Smoking Status Never  Smokeless Tobacco Never    Allergies  Allergen Reactions   Hctz [Hydrochlorothiazide]     Hx of gout; uric acid 14.1 on 03/13/20 Acute gout L elbow 8/11-8/24/21   Objective:  There were no vitals filed for this visit. Body mass index is 37.94 kg/m. Constitutional Well developed. Well nourished.  Vascular Dorsalis pedis pulses palpable bilaterally. Posterior tibial pulses palpable bilaterally. Capillary refill normal to all digits.  No cyanosis or clubbing noted. Pedal hair growth normal.  Neurologic Normal speech. Oriented to person, place, and time. Epicritic sensation to light touch grossly present bilaterally.  Dermatologic Left lower extremity 2+ pitting edema noted to both lower extremity (the right side.  No open wounds or lesion noted.  Hyperpigmentation of the skin noted secondary to severe chronic edema.  No calf pain.  Negative Homan test.  Orthopedic: Normal joint ROM without pain or crepitus bilaterally. No visible deformities. No bony tenderness.   Radiographs: None Assessment:   1. Diabetic polyneuropathy associated with type 2 diabetes mellitus (HCC)   2. Edema of left lower leg    Plan:  Patient was evaluated and treated and all questions answered.  Left lower extremity edema -All questions and concerns were discussed with the patient in extensive detail -Given the amount of pain that he is experiencing he will benefit from Ace bandage compression to help decrease the edema.  I encouraged him to continue wearing it Ace bandage was applied to decrease  edema.  If it continues I also encouraged him to go to the follow-up with his primary care physician to help with reducing fluid to the left lower extremity.  He states understanding  No follow-ups on file.

## 2023-09-09 DIAGNOSIS — I872 Venous insufficiency (chronic) (peripheral): Secondary | ICD-10-CM | POA: Diagnosis not present

## 2023-09-09 DIAGNOSIS — R600 Localized edema: Secondary | ICD-10-CM | POA: Diagnosis not present

## 2023-09-23 DIAGNOSIS — E1122 Type 2 diabetes mellitus with diabetic chronic kidney disease: Secondary | ICD-10-CM | POA: Diagnosis not present

## 2023-09-23 DIAGNOSIS — E7800 Pure hypercholesterolemia, unspecified: Secondary | ICD-10-CM | POA: Diagnosis not present

## 2023-09-23 DIAGNOSIS — I10 Essential (primary) hypertension: Secondary | ICD-10-CM | POA: Diagnosis not present

## 2023-09-23 DIAGNOSIS — K219 Gastro-esophageal reflux disease without esophagitis: Secondary | ICD-10-CM | POA: Diagnosis not present

## 2023-09-23 DIAGNOSIS — E1140 Type 2 diabetes mellitus with diabetic neuropathy, unspecified: Secondary | ICD-10-CM | POA: Diagnosis not present

## 2023-09-29 ENCOUNTER — Ambulatory Visit: Payer: Self-pay

## 2023-09-29 NOTE — Patient Outreach (Signed)
  Care Coordination   Follow Up Visit Note   09/29/2023 Name: [name] MRN: [id number] DOB: 04/22/1961  [name] is a 62 y.o. year old male who sees [name], [place] for primary care. I spoke with  [name] by phone today.  What matters to the patients health and wellness today?  Maintain health    Goals Addressed             This Visit's Progress    Diabetes Management       Patient Goals/Self Care Activities: -Patient/Caregiver will take medications as prescribed   -Patient/Caregiver will attend all scheduled provider appointments -Patient/Caregiver will call provider office for new concerns or questions   -check blood sugar at prescribed times -record values and write them down take them to all doctor visits  -check feet daily for cuts, sores or redness -wash and dry feet carefully every day -wear comfortable, cotton socks -wear comfortable, well-fitting shoes   Spoke with patient.  He reports doing good.  Blood sugar last check was 132.  Discussed diabetes management.  Patient reports he was out of refills from on Jardiance.  Advised to call pharmacy for refill.  He verbalized understanding.  He reports he does not have to wear his boot anymore.  He is back walking daily.  No concerns.          SDOH assessments and interventions completed:  Yes     Care Coordination Interventions:  Yes, provided   Follow up plan: Follow up call scheduled for 11/05/23    Encounter Outcome:  Patient Visit Completed   Mende Biswell Idelle Jo, RN, MSN [place]  [facility], [facility] Management Community Coordinator Direct Dial: [phone]  Fax: [phone] Website: [email or web address]

## 2023-09-29 NOTE — Patient Instructions (Signed)
Visit Information  Thank you for taking time to visit with me today. Please don't hesitate to contact me if I can be of assistance to you.   Following are the goals we discussed today:   Goals Addressed             This Visit's Progress    Diabetes Management       Patient Goals/Self Care Activities: -Patient/Caregiver will take medications as prescribed   -Patient/Caregiver will attend all scheduled provider appointments -Patient/Caregiver will call provider office for new concerns or questions   -check blood sugar at prescribed times -record values and write them down take them to all doctor visits  -check feet daily for cuts, sores or redness -wash and dry feet carefully every day -wear comfortable, cotton socks -wear comfortable, well-fitting shoes   Spoke with patient.  He reports doing good.  Blood sugar last check was 132.  Discussed diabetes management.  Patient reports he was out of refills from on Jardiance.  Advised to call pharmacy for refill.  He verbalized understanding.  He reports he does not have to wear his boot anymore.  He is back walking daily.  No concerns.          Our next appointment is by telephone on 11/05/23 at 0900 am  Please call the care guide team at [phone] if you need to cancel or reschedule your appointment.   If you are experiencing a Mental Health or Behavioral Health Crisis or need someone to talk to, please call the Suicide and Crisis Lifeline: 988   The patient verbalized understanding of instructions, educational materials, and care plan provided today and DECLINED offer to receive copy of patient instructions, educational materials, and care plan.   The patient has been provided with contact information for the care management team and has been advised to call with any health related questions or concerns.   Bary Leriche, RN, MSN [facility], [facility] Management Community  Coordinator Direct Dial: [phone]  Fax: [phone] Website: [email or web address]

## 2023-09-30 DIAGNOSIS — Z6841 Body Mass Index (BMI) 40.0 and over, adult: Secondary | ICD-10-CM | POA: Diagnosis not present

## 2023-09-30 DIAGNOSIS — M109 Gout, unspecified: Secondary | ICD-10-CM | POA: Diagnosis not present

## 2023-09-30 DIAGNOSIS — I872 Venous insufficiency (chronic) (peripheral): Secondary | ICD-10-CM | POA: Diagnosis not present

## 2023-09-30 DIAGNOSIS — E1140 Type 2 diabetes mellitus with diabetic neuropathy, unspecified: Secondary | ICD-10-CM | POA: Diagnosis not present

## 2023-09-30 DIAGNOSIS — I10 Essential (primary) hypertension: Secondary | ICD-10-CM | POA: Diagnosis not present

## 2023-09-30 DIAGNOSIS — E1122 Type 2 diabetes mellitus with diabetic chronic kidney disease: Secondary | ICD-10-CM | POA: Diagnosis not present

## 2023-09-30 DIAGNOSIS — Z86718 Personal history of other venous thrombosis and embolism: Secondary | ICD-10-CM | POA: Diagnosis not present

## 2023-09-30 DIAGNOSIS — E7800 Pure hypercholesterolemia, unspecified: Secondary | ICD-10-CM | POA: Diagnosis not present

## 2023-10-01 ENCOUNTER — Encounter: Payer: Self-pay | Admitting: Podiatry

## 2023-10-01 ENCOUNTER — Ambulatory Visit (INDEPENDENT_AMBULATORY_CARE_PROVIDER_SITE_OTHER): Payer: Medicare HMO | Admitting: Podiatry

## 2023-10-01 DIAGNOSIS — M79674 Pain in right toe(s): Secondary | ICD-10-CM | POA: Diagnosis not present

## 2023-10-01 DIAGNOSIS — E1142 Type 2 diabetes mellitus with diabetic polyneuropathy: Secondary | ICD-10-CM

## 2023-10-01 DIAGNOSIS — M79675 Pain in left toe(s): Secondary | ICD-10-CM | POA: Diagnosis not present

## 2023-10-01 DIAGNOSIS — N182 Chronic kidney disease, stage 2 (mild): Secondary | ICD-10-CM

## 2023-10-01 DIAGNOSIS — B351 Tinea unguium: Secondary | ICD-10-CM

## 2023-10-01 NOTE — Progress Notes (Signed)
This patient returns to my office for at risk foot care.  This patient requires this care by a professional since this patient will be at risk due to having diabetes  and kidney disease.  This patient is unable to cut nails himself since the patient cannot reach his nails.These nails are painful walking and wearing shoes.  This patient presents for at risk foot care today.  General Appearance  Alert, conversant and in no acute stress.  Vascular  Dorsalis pedis and posterior tibial  pulses are  not palpable  due to swelling. bilaterally.  Capillary return is within normal limits  bilaterally. Temperature is within normal limits  bilaterally.  Neurologic  Senn-Weinstein monofilament wire test within normal limits  bilaterally. Muscle power within normal limits bilaterally.  Nails Thick disfigured discolored nails with subungual debris  from hallux to fifth toes bilaterally. No evidence of bacterial infection or drainage bilaterally. Rams horn nails.  Orthopedic  No limitations of motion  feet .  No crepitus or effusions noted.  No bony pathology or digital deformities noted.  Skin  normotropic skin with no porokeratosis noted bilaterally.  No signs of infections or ulcers noted.     Onychomycosis  Pain in right toes  Pain in left toes  Consent was obtained for treatment procedures.   Mechanical debridement of nails 1-5  bilaterally performed with a nail nipper.  Filed with dremel without incident.    Return office visit      3 mos.               Told patient to return for periodic foot care and evaluation due to potential at risk complications.   Gardiner Barefoot DPM

## 2023-10-09 ENCOUNTER — Telehealth: Payer: Self-pay

## 2023-10-09 NOTE — Patient Outreach (Signed)
  Care Coordination   Follow Up Visit Note   10/09/2023 Name: [name] MRN: [id number] DOB: 07/21/1961  [name] is a 62 y.o. year old male who sees [name], [place] for primary care. I spoke with  [name] by phone today.  What matters to the patients health and wellness today?  Needing to make eye appointment    Goals Addressed             This Visit's Progress    Diabetes Management       Patient Goals/Self Care Activities: -Patient/Caregiver will take medications as prescribed   -Patient/Caregiver will attend all scheduled provider appointments -Patient/Caregiver will call provider office for new concerns or questions   -check blood sugar at prescribed times -record values and write them down take them to all doctor visits  -check feet daily for cuts, sores or redness -wash and dry feet carefully every day -wear comfortable, cotton socks -wear comfortable, well-fitting shoes   Spoke with patient.  He reports doing good.  Advised him that CM spoke with PCP yesterday and she stated that he needed an eye exam.  Patient advised on eye doctor [facility].  He wrote down the phone number and states he will call them.  He declined needing assistance.  Advised to call CM back if he runs into any issues setting up appointment.  He verbalized understanding.  Patient recently put on 70/30 insulin as his A1c was 10.1.  He now states sugars are better.  100-130 range when he checks.  Reiterated diet and exercise.  He verbalized understanding.  [place] day.  No concerns.          SDOH assessments and interventions completed:  Yes  SDOH Interventions Today    Flowsheet Row Most Recent Value  SDOH Interventions   Housing Interventions Intervention Not Indicated        Care Coordination Interventions:  Yes, provided   Follow up plan: Follow up call scheduled for December    Encounter Outcome:  Patient Visit Completed    Bary Leriche, RN, MSN [place]  [facility], [facility] Management Community Coordinator Direct Dial: [phone]  Fax: [phone] Website: [email or web address]

## 2023-10-09 NOTE — Patient Instructions (Signed)
Visit Information  Thank you for taking time to visit with me today. Please don't hesitate to contact me if I can be of assistance to you.   Following are the goals we discussed today:   Goals Addressed             This Visit's Progress    Diabetes Management       Patient Goals/Self Care Activities: -Patient/Caregiver will take medications as prescribed   -Patient/Caregiver will attend all scheduled provider appointments -Patient/Caregiver will call provider office for new concerns or questions   -check blood sugar at prescribed times -record values and write them down take them to all doctor visits  -check feet daily for cuts, sores or redness -wash and dry feet carefully every day -wear comfortable, cotton socks -wear comfortable, well-fitting shoes   Spoke with patient.  He reports doing good.  Advised him that CM spoke with PCP yesterday and she stated that he needed an eye exam.  Patient advised on eye doctor [facility].  He wrote down the phone number and states he will call them.  He declined needing assistance.  Advised to call CM back if he runs into any issues setting up appointment.  He verbalized understanding.  Patient recently put on 70/30 insulin as his A1c was 10.1.  He now states sugars are better.  100-130 range when he checks.  Reiterated diet and exercise.  He verbalized understanding.  [place] neighborhood [place] day.  No concerns.          Our next appointment is by telephone on 11-05-23 at 0900  Please call the care guide team at [phone] if you need to cancel or reschedule your appointment.   If you are experiencing a Mental Health or Behavioral Health Crisis or need someone to talk to, please call the Suicide and Crisis Lifeline: 988   The patient verbalized understanding of instructions, educational materials, and care plan provided today and DECLINED offer to receive copy of patient instructions, educational materials, and care  plan.   The patient has been provided with contact information for the care management team and has been advised to call with any health related questions or concerns.   Bary Leriche, RN, MSN [facility], [facility] Management Community Coordinator Direct Dial: [phone]  Fax: [phone] Website: [email or web address]

## 2023-10-10 ENCOUNTER — Telehealth: Payer: Self-pay

## 2023-10-10 NOTE — Patient Outreach (Signed)
  Care Coordination   10/10/2023 Name: [name] MRN: [id number] DOB: 1960-12-13   Care Coordination Outreach Attempts:  An unsuccessful telephone outreach was attempted today to offer the patient information about available care coordination services.  Follow Up Plan:  Additional outreach attempts will be made to offer the patient care coordination information and services.   Encounter Outcome:  No Answer   Care Coordination Interventions:  No, not indicated    Randy Whitener Idelle Jo, RN, MSN RN Care Manager [facility], Population Health Direct Dial: [phone]  Fax: [phone] Website: [email or web address]

## 2023-10-22 ENCOUNTER — Telehealth: Payer: Self-pay

## 2023-10-22 NOTE — Patient Instructions (Signed)
Visit Information  Thank you for taking time to visit with me today. Please don't hesitate to contact me if I can be of assistance to you.   Following are the goals we discussed today:   Goals Addressed             This Visit's Progress    Diabetes Management       Patient Goals/Self Care Activities: -Patient/Caregiver will take medications as prescribed   -Patient/Caregiver will attend all scheduled provider appointments -Patient/Caregiver will call provider office for new concerns or questions   -check blood sugar at prescribed times -record values and write them down take them to all doctor visits  -check feet daily for cuts, sores or redness -wash and dry feet carefully every day -wear comfortable, cotton socks -wear comfortable, well-fitting shoes   Spoke with patient.  He reports doing good.  Spoke with patient he reports he needs his Jardiance refilled.  Advised him that I am at his PCP office and will talk with Dr. France Ravens nurse.  He verbalized understanding. He still had not made eye exam.  He asked if CM could help with that.  Advised CM would call the eye doctor.    Spoke with [name], CMA.  She will send prescription over and she recommended [facility] being that office is so close to PCP office and patient is familiar with the area.    Called [facility].  They need a referral before they can call patient to make appointment. Patient account created.  Message sent to [facility] to send referral.        Our next appointment is by telephone on 11/04/24 at 0900  Please call the care guide team at [phone] if you need to cancel or reschedule your appointment.   If you are experiencing a Mental Health or Behavioral Health Crisis or need someone to talk to, please call the Suicide and Crisis Lifeline: 988   The patient verbalized understanding of instructions, educational materials, and care plan provided today and DECLINED offer to receive copy of patient  instructions, educational materials, and care plan.   The patient has been provided with contact information for the care management team and has been advised to call with any health related questions or concerns.   Bary Leriche, RN, MSN RN Care Manager [facility], Population Health Direct Dial: [phone]  Fax: [phone] Website: [email or web address]

## 2023-10-22 NOTE — Patient Outreach (Signed)
  Care Coordination   Follow Up Visit Note   10/22/2023 Name: [name] MRN: [id number] DOB: 11/01/1961  [name] is a 62 y.o. year old male who sees [name], [place] for primary care. I spoke with  [name] by phone today.  What matters to the patients health and wellness today?  Getting Jardiance refilled    Goals Addressed             This Visit's Progress    Diabetes Management       Patient Goals/Self Care Activities: -Patient/Caregiver will take medications as prescribed   -Patient/Caregiver will attend all scheduled provider appointments -Patient/Caregiver will call provider office for new concerns or questions   -check blood sugar at prescribed times -record values and write them down take them to all doctor visits  -check feet daily for cuts, sores or redness -wash and dry feet carefully every day -wear comfortable, cotton socks -wear comfortable, well-fitting shoes   Spoke with patient.  He reports doing good.  Spoke with patient he reports he needs his Jardiance refilled.  Advised him that I am at his PCP office and will talk with [name] nurse.  He verbalized understanding. He still had not made eye exam.  He asked if CM could help with that.  Advised CM would call the eye doctor.    Spoke with [name], CMA.  She will send prescription over and she recommended [name] being that office is so close to PCP office and patient is familiar with the area.    Called [name].  They need a referral before they can call patient to make appointment. Patient account created.  Message sent to [name] to send referral.        SDOH assessments and interventions completed:  Yes     Care Coordination Interventions:  Yes, provided   Follow up plan: Follow up call scheduled for December    Encounter Outcome:  Patient Visit Completed   [name] Leriche, RN, MSN RN Care Manager [place] [name], Population  Health Direct Dial: [phone]  Fax: [phone] Website: [email or web address]

## 2023-10-24 DIAGNOSIS — M109 Gout, unspecified: Secondary | ICD-10-CM | POA: Diagnosis not present

## 2023-10-24 DIAGNOSIS — K219 Gastro-esophageal reflux disease without esophagitis: Secondary | ICD-10-CM | POA: Diagnosis not present

## 2023-10-24 DIAGNOSIS — I10 Essential (primary) hypertension: Secondary | ICD-10-CM | POA: Diagnosis not present

## 2023-10-24 DIAGNOSIS — E7800 Pure hypercholesterolemia, unspecified: Secondary | ICD-10-CM | POA: Diagnosis not present

## 2023-10-24 DIAGNOSIS — E1140 Type 2 diabetes mellitus with diabetic neuropathy, unspecified: Secondary | ICD-10-CM | POA: Diagnosis not present

## 2023-11-05 ENCOUNTER — Ambulatory Visit: Payer: Self-pay

## 2023-11-05 NOTE — Patient Outreach (Signed)
  Care Coordination   Follow Up Visit Note   11/05/2023 Name: [name] MRN: [id number] DOB: 1961-09-13  [name] is a 62 y.o. year old male who sees [name], [place] for primary care. I spoke with  [name] by phone today.  What matters to the patients health and wellness today?  Maintain health    Goals Addressed             This Visit's Progress    Diabetes Management       Patient Goals/Self Care Activities: -Patient/Caregiver will take medications as prescribed   -Patient/Caregiver will attend all scheduled provider appointments -Patient/Caregiver will call provider office for new concerns or questions   -check blood sugar at prescribed times -record values and write them down take them to all doctor visits  -check feet daily for cuts, sores or redness -wash and dry feet carefully every day -wear comfortable, cotton socks -wear comfortable, well-fitting shoes   Patient reports doing good. He still has not heard from [facility].  Advised patient that CM would inquire as CM in PCP office today. Blood sugar this AM was 98.  Encouraged continued diabetes management.  Spoke with referral nurse.  Referral has been sent.  Advised patient to call [facility] to set appointment.  He verbalized understanding.  No RN CM concerns.        SDOH assessments and interventions completed:  Yes     Care Coordination Interventions:  Yes, provided   Follow up plan: Follow up call scheduled for January    Encounter Outcome:  Patient Visit Completed   Bary Leriche, RN, MSN RN Care Manager [facility], Population Health Direct Dial: [phone]  Fax: [phone] Website: [email or web address]

## 2023-11-05 NOTE — Patient Instructions (Signed)
Visit Information  Thank you for taking time to visit with me today. Please don't hesitate to contact me if I can be of assistance to you.   Following are the goals we discussed today:   Goals Addressed             This Visit's Progress    Diabetes Management       Patient Goals/Self Care Activities: -Patient/Caregiver will take medications as prescribed   -Patient/Caregiver will attend all scheduled provider appointments -Patient/Caregiver will call provider office for new concerns or questions   -check blood sugar at prescribed times -record values and write them down take them to all doctor visits  -check feet daily for cuts, sores or redness -wash and dry feet carefully every day -wear comfortable, cotton socks -wear comfortable, well-fitting shoes   Patient reports doing good. He still has not heard from [facility].  Advised patient that CM would inquire as CM in PCP office today. Blood sugar this AM was 98.  Encouraged continued diabetes management.  Spoke with referral nurse.  Referral has been sent.  Advised patient to call [facility] to set appointment.  He verbalized understanding.  No RN CM concerns.        Our next appointment is by telephone on 11/26/23 at 0900  Please call the care guide team at [phone] if you need to cancel or reschedule your appointment.   If you are experiencing a Mental Health or Behavioral Health Crisis or need someone to talk to, please call the Suicide and Crisis Lifeline: 988   The patient verbalized understanding of instructions, educational materials, and care plan provided today and DECLINED offer to receive copy of patient instructions, educational materials, and care plan.   The patient has been provided with contact information for the care management team and has been advised to call with any health related questions or concerns.   Bary Leriche, RN, MSN RN Care Manager [facility],  Population Health Direct Dial: [phone]  Fax: [phone] Website: [email or web address]

## 2023-11-26 ENCOUNTER — Ambulatory Visit: Payer: Self-pay

## 2023-11-26 NOTE — Patient Outreach (Signed)
  Care Coordination   11/26/2023 Name: [name] MRN: [id number] DOB: 1961-02-20   Care Coordination Outreach Attempts:  An unsuccessful outreach was attempted for an appointment today.  Follow Up Plan:  Additional outreach attempts will be made to offer the patient complex care management information and services.   Encounter Outcome:  No Answer   Care Coordination Interventions:  No, not indicated    Senon Nixon J Yasheka Fossett, RN, MSN RN Care Manager [facility], Population Health Direct Dial: [phone]  Fax: [phone] Website: [email or web address]

## 2023-12-03 ENCOUNTER — Encounter: Payer: Self-pay | Admitting: Podiatry

## 2023-12-03 ENCOUNTER — Ambulatory Visit (INDEPENDENT_AMBULATORY_CARE_PROVIDER_SITE_OTHER): Payer: Medicare HMO | Admitting: Podiatry

## 2023-12-03 DIAGNOSIS — B351 Tinea unguium: Secondary | ICD-10-CM | POA: Diagnosis not present

## 2023-12-03 DIAGNOSIS — M79675 Pain in left toe(s): Secondary | ICD-10-CM | POA: Diagnosis not present

## 2023-12-03 DIAGNOSIS — M79674 Pain in right toe(s): Secondary | ICD-10-CM

## 2023-12-03 DIAGNOSIS — E1142 Type 2 diabetes mellitus with diabetic polyneuropathy: Secondary | ICD-10-CM | POA: Diagnosis not present

## 2023-12-03 NOTE — Progress Notes (Signed)
 This patient returns to my office for at risk foot care.  This patient requires this care by a professional since this patient will be at risk due to having diabetes  and kidney disease.  This patient is unable to cut nails himself since the patient cannot reach his nails.These nails are painful walking and wearing shoes.  This patient presents for at risk foot care today.  General Appearance  Alert, conversant and in no acute stress.  Vascular  Dorsalis pedis and posterior tibial  pulses are  not palpable  due to swelling. bilaterally.  Capillary return is within normal limits  bilaterally. Temperature is within normal limits  bilaterally.  Neurologic  Senn-Weinstein monofilament wire test within normal limits  bilaterally. Muscle power within normal limits bilaterally.  Nails Thick disfigured discolored nails with subungual debris  from hallux to fifth toes bilaterally. No evidence of bacterial infection or drainage bilaterally. Rams horn nails.  Orthopedic  No limitations of motion  feet .  No crepitus or effusions noted.  No bony pathology or digital deformities noted.  Skin  normotropic skin with no porokeratosis noted bilaterally.  No signs of infections or ulcers noted.     Onychomycosis  Pain in right toes  Pain in left toes  Consent was obtained for treatment procedures.   Mechanical debridement of nails 1-5  bilaterally performed with a nail nipper.  Filed with dremel without incident.    Return office visit      3 mos.               Told patient to return for periodic foot care and evaluation due to potential at risk complications.   Kalvyn Desa DPM

## 2023-12-05 ENCOUNTER — Telehealth: Payer: Self-pay

## 2023-12-05 NOTE — Patient Outreach (Signed)
  Care Coordination   12/05/2023 Name: [name] MRN: [id number] DOB: 1961/11/06   Care Coordination Outreach Attempts:  A second unsuccessful outreach was attempted today to offer the patient with information about available complex care management services.  Follow Up Plan:  Additional outreach attempts will be made to offer the patient complex care management information and services.   Encounter Outcome:  No Answer   Care Coordination Interventions:  No, not indicated    Melanie Pellot Idelle Jo, RN, MSN [facility], [facility] Health RN Care Manager Direct Dial: [phone]  Fax: [phone] Website: [email or web address]

## 2023-12-23 ENCOUNTER — Telehealth: Payer: Self-pay

## 2023-12-23 NOTE — Patient Outreach (Signed)
  Care Coordination   12/23/2023 Name: [name] MRN: [id number] DOB: September 12, 1961   Care Coordination Outreach Attempts:  A third unsuccessful outreach was attempted today to offer the patient with information about available complex care management services.  Follow Up Plan:  No further outreach attempts will be made at this time. We have been unable to contact the patient to offer or enroll patient in complex care management services.  Encounter Outcome:  No Answer   Care Coordination Interventions:  No, not indicated    Lavaun DOROTHA Seeds RN, MSN [facility] Health  [facility], [facility] Health RN Care Manager Direct Dial: [phone]  Fax: [phone] Website: [email or web address]

## 2024-01-01 ENCOUNTER — Encounter: Payer: Self-pay | Admitting: Podiatry

## 2024-01-01 ENCOUNTER — Ambulatory Visit (INDEPENDENT_AMBULATORY_CARE_PROVIDER_SITE_OTHER): Payer: Medicare HMO | Admitting: Podiatry

## 2024-01-01 DIAGNOSIS — N182 Chronic kidney disease, stage 2 (mild): Secondary | ICD-10-CM

## 2024-01-01 DIAGNOSIS — E1142 Type 2 diabetes mellitus with diabetic polyneuropathy: Secondary | ICD-10-CM

## 2024-01-01 NOTE — Progress Notes (Signed)
This patient presents to the office for diabetic foot exam.This patient says there is no pain or discomfort in her feet.  No history of infection or drainage.  This patient presents to the office for foot exam due to having a history of diabetes.  Vascular  Dorsalis pedis and posterior tibial pulses are  not palpable  B/L.  Capillary return  WNL.  Temperature gradient is  WNL.  Skin turgor  WNL Venous stasis feet  B/L.  Sensorium  Senn Weinstein monofilament wire  WNL. Normal tactile sensation.  Nail Exam  Patient has onychomycotic nails with no evidence of bacterial or fungal infection.  Orthopedic  Exam  Muscle tone and muscle strength  WNL.  limitations of motion  STJ  of feet  B/L.  No crepitus or joint effusion noted.  Foot type is unremarkable and digits show no abnormalities.  Bony prominences are unremarkable.  Skin  No open lesions.  Normal skin texture and turgor.   Diabetes with no complications  Diabetic foot exam was performed.  There is no evidence  neurologic pathology. Vascular studies reveal absence of pedal pulses due to swelling.  RTC 3 months    Helane Gunther DPM

## 2024-01-20 DIAGNOSIS — E1140 Type 2 diabetes mellitus with diabetic neuropathy, unspecified: Secondary | ICD-10-CM | POA: Diagnosis not present

## 2024-01-20 DIAGNOSIS — K219 Gastro-esophageal reflux disease without esophagitis: Secondary | ICD-10-CM | POA: Diagnosis not present

## 2024-01-20 DIAGNOSIS — E7800 Pure hypercholesterolemia, unspecified: Secondary | ICD-10-CM | POA: Diagnosis not present

## 2024-01-20 DIAGNOSIS — M109 Gout, unspecified: Secondary | ICD-10-CM | POA: Diagnosis not present

## 2024-01-20 DIAGNOSIS — I10 Essential (primary) hypertension: Secondary | ICD-10-CM | POA: Diagnosis not present

## 2024-01-27 DIAGNOSIS — M109 Gout, unspecified: Secondary | ICD-10-CM | POA: Diagnosis not present

## 2024-01-27 DIAGNOSIS — E7800 Pure hypercholesterolemia, unspecified: Secondary | ICD-10-CM | POA: Diagnosis not present

## 2024-01-27 DIAGNOSIS — Z86718 Personal history of other venous thrombosis and embolism: Secondary | ICD-10-CM | POA: Diagnosis not present

## 2024-01-27 DIAGNOSIS — Z0000 Encounter for general adult medical examination without abnormal findings: Secondary | ICD-10-CM | POA: Diagnosis not present

## 2024-01-27 DIAGNOSIS — E1140 Type 2 diabetes mellitus with diabetic neuropathy, unspecified: Secondary | ICD-10-CM | POA: Diagnosis not present

## 2024-01-27 DIAGNOSIS — N1831 Chronic kidney disease, stage 3a: Secondary | ICD-10-CM | POA: Diagnosis not present

## 2024-01-27 DIAGNOSIS — I872 Venous insufficiency (chronic) (peripheral): Secondary | ICD-10-CM | POA: Diagnosis not present

## 2024-01-27 DIAGNOSIS — Z6841 Body Mass Index (BMI) 40.0 and over, adult: Secondary | ICD-10-CM | POA: Diagnosis not present

## 2024-01-27 DIAGNOSIS — F70 Mild intellectual disabilities: Secondary | ICD-10-CM | POA: Diagnosis not present

## 2024-03-03 ENCOUNTER — Ambulatory Visit (INDEPENDENT_AMBULATORY_CARE_PROVIDER_SITE_OTHER): Payer: Medicare HMO | Admitting: Podiatry

## 2024-03-03 DIAGNOSIS — M7752 Other enthesopathy of left foot: Secondary | ICD-10-CM

## 2024-03-03 NOTE — Progress Notes (Signed)
 Subjective:  Patient ID: [name], male    DOB: 1961/02/07,  MRN: [id number]  Chief Complaint  Patient presents with   Foot Pain    63 y.o. male presents with the above complaint.  Patient presents with left ankle pain that has been off for quite some time is progressive gotten worse worse with ambulation with pressure she would like to discuss treatment option for he states it hurts to taking a step in the morning came out of nowhere there are some swelling associate with illness or discuss steroid injection   Review of Systems: Negative except as noted in the HPI. Denies N/V/F/Ch.  Past Medical History:  Diagnosis Date   Gouty bursitis, elbow  10/18/2015   Hyperlipidemia    Hypertension    Knee pain, chronic    Type II diabetes mellitus (HCC)     Current Outpatient Medications:    Accu-Chek Softclix Lancets lancets, TEST BLOOD SUGAR THREE TIMES DAILY AS DIRECTED (APPOINTMENT IS NEEDED FOR REFILLS), Disp: 100 each, Rfl: 0   Alcohol Swabs (B-D SINGLE USE SWABS REGULAR) PADS, Please use as instructed before checking blood sugar and before injecting insulin., Disp: 100 each, Rfl: 11   allopurinol (ZYLOPRIM) 300 MG tablet, Take 1 tablet (300 mg total) by mouth daily., Disp: 30 tablet, Rfl: 0   amLODipine (NORVASC) 10 MG tablet, Take 1 tablet (10 mg total) by mouth daily., Disp: , Rfl:    apixaban (ELIQUIS) 5 MG TABS tablet, Take 1 tablet (5 mg total) by mouth 2 (two) times daily., Disp: 60 tablet, Rfl: 0   atorvastatin (LIPITOR) 40 MG tablet, Take 1 tablet (40 mg total) by mouth daily at 6 PM., Disp: 30 tablet, Rfl: 0   Blood Glucose Calibration (ACCU-CHEK AVIVA) SOLN, Use to calibrate your glucometer when needed., Disp: 1 each, Rfl: 0   Blood Glucose Monitoring Suppl (ACCU-CHEK AVIVA PLUS) w/Device KIT, USE AS INSTRUCTED THREE TIMES DAILY. E11.69, Disp: 1 kit, Rfl: 0   diclofenac Sodium (VOLTAREN) 1 % GEL, APPLY 4 GRAMS TOPICALLY AS INSTRUCTED FOUR TIMES DAILY., Disp: 200 g, Rfl:  0   ferrous sulfate 325 (65 FE) MG tablet, Take 1 tablet (325 mg total) by mouth daily with breakfast. For Iron Deficiency Anemia, Disp: 30 tablet, Rfl: 0   glucose blood (ACCU-CHEK AVIVA PLUS) test strip, TEST BLOOD SUGAR THREE TIMES DAILY AS DIRECTED, Disp: 100 each, Rfl: 0   insulin aspart (NOVOLOG) 100 UNIT/ML injection, Inject 0-15 Units into the skin 3 (three) times daily with meals. CBG < 70: Implement Hypoglycemia Standing Orders and refer to Hypoglycemia Standing Orders sidebar report CBG 70 - 120: 0 units CBG 121 - 150: 2 units CBG 151 - 200: 3 units CBG 201 - 250: 5 units CBG 251 - 300: 8 units CBG 301 - 350: 11 units CBG 351 - 400: 15 units CBG > 400: call MD., Disp: , Rfl:    insulin aspart protamine- aspart (NOVOLOG MIX 70/30) (70-30) 100 UNIT/ML injection, Inject 0.5 mLs (50 Units total) into the skin 2 (two) times daily with a meal., Disp: 10 mL, Rfl: 6   insulin glargine (LANTUS) 100 UNIT/ML injection, Inject 50 Units into the skin daily., Disp: , Rfl:    JARDIANCE 25 MG TABS tablet, 1 tablet Orally Once a day for diabetes for 90 days, Disp: , Rfl:    liraglutide (VICTOZA) 18 MG/3ML SOPN, INJECT 1.8 MG INTO THE SKIN DAILY., Disp: 27 mL, Rfl: 1   Magnesium Oxide 200 MG TABS, Take 2  tablets (400 mg total) by mouth daily. Take 2 tabs to = 400 mg, Disp: 30 tablet, Rfl: 0   methylPREDNISolone (MEDROL DOSEPAK) 4 MG TBPK tablet, Take as directed, Disp: 21 each, Rfl: 0   metoprolol succinate (TOPROL-XL) 50 MG 24 hr tablet, Take 1.5 tablets (75 mg total) by mouth daily. Take with or immediately following a meal., Disp: , Rfl:    Multiple Vitamin (MULTIVITAMIN WITH MINERALS) TABS tablet, Take 1 tablet by mouth daily., Disp: , Rfl:    Nutritional Supplements (,FEEDING SUPPLEMENT, PROSOURCE PLUS) liquid, Take 30 mLs by mouth 2 (two) times daily between meals., Disp: , Rfl:    Nutritional Supplements (FEEDING SUPPLEMENT, KATE FARMS STANDARD 1.4,) LIQD liquid, Take 325 mLs by mouth daily., Disp: ,  Rfl:    pantoprazole (PROTONIX) 40 MG tablet, Take 1 tablet (40 mg total) by mouth daily., Disp: , Rfl:    polyethylene glycol (MIRALAX / GLYCOLAX) 17 g packet, Take 17 g by mouth daily., Disp: , Rfl:   Social History   Tobacco Use  Smoking Status Never  Smokeless Tobacco Never    Allergies  Allergen Reactions   Hctz [Hydrochlorothiazide]     Hx of gout; uric acid 14.1 on 03/13/20 Acute gout L elbow 8/11-8/24/21   Objective:  There were no vitals filed for this visit. There is no height or weight on file to calculate BMI. Constitutional Well developed. Well nourished.  Vascular Dorsalis pedis pulses palpable bilaterally. Posterior tibial pulses palpable bilaterally. Capillary refill normal to all digits.  No cyanosis or clubbing noted. Pedal hair growth normal.  Neurologic Normal speech. Oriented to person, place, and time. Epicritic sensation to light touch grossly present bilaterally.  Dermatologic Nails well groomed and normal in appearance. No open wounds. No skin lesions.  Orthopedic: Pain on palpation left ankle pain with range of motion of the ankle joint pain on palpation with range of motion of the ankle joint deep intra-articular pain noted.  No pain at the Achilles tendon posterior tibial tendon peroneal tendon   Radiographs: None Assessment:   1. Capsulitis of ankle, left    Plan:  Patient was evaluated and treated and all questions answered.  Left ankle capsulitis - All questions and concerns were discussed with the patient extensive due to given the amount of pain that he is having he will benefit from cam boot immobilization to allow the soft tissue structure to heal appropriately.  I will see him back again in 4 weeks if there is no improvement we will discuss steroid injection.  Patient agrees with the plan for now he will manage with cam boot - Cam boot was dispensed  No follow-ups on file.

## 2024-03-18 DIAGNOSIS — H2513 Age-related nuclear cataract, bilateral: Secondary | ICD-10-CM | POA: Diagnosis not present

## 2024-03-18 DIAGNOSIS — H35013 Changes in retinal vascular appearance, bilateral: Secondary | ICD-10-CM | POA: Diagnosis not present

## 2024-03-18 DIAGNOSIS — H35033 Hypertensive retinopathy, bilateral: Secondary | ICD-10-CM | POA: Diagnosis not present

## 2024-03-18 DIAGNOSIS — E119 Type 2 diabetes mellitus without complications: Secondary | ICD-10-CM | POA: Diagnosis not present

## 2024-03-31 ENCOUNTER — Encounter: Payer: Self-pay | Admitting: Podiatry

## 2024-03-31 ENCOUNTER — Ambulatory Visit (INDEPENDENT_AMBULATORY_CARE_PROVIDER_SITE_OTHER): Payer: Medicare HMO | Admitting: Podiatry

## 2024-03-31 DIAGNOSIS — B351 Tinea unguium: Secondary | ICD-10-CM

## 2024-03-31 DIAGNOSIS — M79674 Pain in right toe(s): Secondary | ICD-10-CM | POA: Diagnosis not present

## 2024-03-31 DIAGNOSIS — E1142 Type 2 diabetes mellitus with diabetic polyneuropathy: Secondary | ICD-10-CM

## 2024-03-31 DIAGNOSIS — M79675 Pain in left toe(s): Secondary | ICD-10-CM | POA: Diagnosis not present

## 2024-03-31 NOTE — Progress Notes (Deleted)
 This patient presents to the office for diabetic foot exam.This patient says there is no pain or discomfort in her feet.  No history of infection or drainage.  This patient presents to the office for foot exam due to having a history of diabetes.  Vascular  Dorsalis pedis and posterior tibial pulses are  not palpable  B/L.  Capillary return  WNL.  Temperature gradient is  WNL.  Skin turgor  WNL Venous stasis feet  B/L.  Sensorium  Senn Weinstein monofilament wire  WNL. Normal tactile sensation.  Nail Exam  Patient has onychomycotic nails with no evidence of bacterial or fungal infection.  Orthopedic  Exam  Muscle tone and muscle strength  WNL.  limitations of motion  STJ  of feet  B/L.  No crepitus or joint effusion noted.  Foot type is unremarkable and digits show no abnormalities.  Bony prominences are unremarkable.  Skin  No open lesions.  Normal skin texture and turgor.   Diabetes with no complications  Diabetic foot exam was performed.  There is no evidence  neurologic pathology. Vascular studies reveal absence of pedal pulses due to swelling.  RTC 3 months    Helane Gunther DPM

## 2024-03-31 NOTE — Progress Notes (Signed)
This patient returns to my office for at risk foot care.  This patient requires this care by a professional since this patient will be at risk due to having diabetes  and kidney disease.  This patient is unable to cut nails himself since the patient cannot reach his nails.These nails are painful walking and wearing shoes.  This patient presents for at risk foot care today.  General Appearance  Alert, conversant and in no acute stress.  Vascular  Dorsalis pedis and posterior tibial  pulses are  not palpable  due to swelling. bilaterally.  Capillary return is within normal limits  bilaterally. Temperature is within normal limits  bilaterally.  Neurologic  Senn-Weinstein monofilament wire test within normal limits  bilaterally. Muscle power within normal limits bilaterally.  Nails Thick disfigured discolored nails with subungual debris  from hallux to fifth toes bilaterally. No evidence of bacterial infection or drainage bilaterally. Rams horn nails.  Orthopedic  No limitations of motion  feet .  No crepitus or effusions noted.  No bony pathology or digital deformities noted.  Skin  normotropic skin with no porokeratosis noted bilaterally.  No signs of infections or ulcers noted.     Onychomycosis  Pain in right toes  Pain in left toes  Consent was obtained for treatment procedures.   Mechanical debridement of nails 1-5  bilaterally performed with a nail nipper.  Filed with dremel without incident.    Return office visit      3 mos.               Told patient to return for periodic foot care and evaluation due to potential at risk complications.   Gardiner Barefoot DPM

## 2024-04-02 ENCOUNTER — Ambulatory Visit: Admitting: Podiatry

## 2024-04-23 ENCOUNTER — Ambulatory Visit (INDEPENDENT_AMBULATORY_CARE_PROVIDER_SITE_OTHER): Admitting: Podiatry

## 2024-04-23 DIAGNOSIS — M7752 Other enthesopathy of left foot: Secondary | ICD-10-CM | POA: Diagnosis not present

## 2024-04-23 MED ORDER — METHYLPREDNISOLONE 4 MG PO TBPK: ORAL_TABLET | ORAL | 0 refills | Status: AC

## 2024-04-23 MED ORDER — MELOXICAM 15 MG PO TABS: 15.0000 mg | ORAL_TABLET | Freq: Every day | ORAL | 0 refills | Status: DC

## 2024-04-23 NOTE — Progress Notes (Signed)
 Subjective:  Patient ID: [name], male    DOB: 10-03-1961,  MRN: [id number]  Chief Complaint  Patient presents with   Foot Pain    63 y.o. male presents with the above complaint.  Patient presents with complaint of left ankle pain.  She states that is hurting still a little bit.  Cam boot immobilization helped some.  She is doing so well.  Took shoes she would like to discuss other treatment options including oral medication  Review of Systems: Negative except as noted in the HPI. Denies N/V/F/Ch.  Past Medical History:  Diagnosis Date   Gouty bursitis, elbow  10/18/2015   Hyperlipidemia    Hypertension    Knee pain, chronic    Type II diabetes mellitus (HCC)     Current Outpatient Medications:    meloxicam  (MOBIC ) 15 MG tablet, Take 1 tablet (15 mg total) by mouth daily., Disp: 30 tablet, Rfl: 0   methylPREDNISolone  (MEDROL  DOSEPAK) 4 MG TBPK tablet, Take as directed, Disp: 21 each, Rfl: 0   Accu-Chek Softclix Lancets lancets, TEST BLOOD SUGAR THREE TIMES DAILY AS DIRECTED (APPOINTMENT IS NEEDED FOR REFILLS), Disp: 100 each, Rfl: 0   Alcohol Swabs (B-D SINGLE USE SWABS REGULAR) PADS, Please use as instructed before checking blood sugar and before injecting insulin ., Disp: 100 each, Rfl: 11   allopurinol  (ZYLOPRIM ) 300 MG tablet, Take 1 tablet (300 mg total) by mouth daily., Disp: 30 tablet, Rfl: 0   amLODipine  (NORVASC ) 10 MG tablet, Take 1 tablet (10 mg total) by mouth daily., Disp: , Rfl:    apixaban  (ELIQUIS ) 5 MG TABS tablet, Take 1 tablet (5 mg total) by mouth 2 (two) times daily., Disp: 60 tablet, Rfl: 0   atorvastatin  (LIPITOR) 40 MG tablet, Take 1 tablet (40 mg total) by mouth daily at 6 PM., Disp: 30 tablet, Rfl: 0   Blood Glucose Calibration (ACCU-CHEK AVIVA) SOLN, Use to calibrate your glucometer when needed., Disp: 1 each, Rfl: 0   Blood Glucose Monitoring Suppl (ACCU-CHEK AVIVA PLUS) w/Device KIT, USE AS INSTRUCTED THREE TIMES DAILY. E11.69, Disp: 1 kit, Rfl: 0    diclofenac  Sodium (VOLTAREN ) 1 % GEL, APPLY 4 GRAMS TOPICALLY AS INSTRUCTED FOUR TIMES DAILY., Disp: 200 g, Rfl: 0   ferrous sulfate  325 (65 FE) MG tablet, Take 1 tablet (325 mg total) by mouth daily with breakfast. For Iron Deficiency Anemia, Disp: 30 tablet, Rfl: 0   glucose blood (ACCU-CHEK AVIVA PLUS) test strip, TEST BLOOD SUGAR THREE TIMES DAILY AS DIRECTED, Disp: 100 each, Rfl: 0   insulin  aspart (NOVOLOG ) 100 UNIT/ML injection, Inject 0-15 Units into the skin 3 (three) times daily with meals. CBG < 70: Implement Hypoglycemia Standing Orders and refer to Hypoglycemia Standing Orders sidebar report CBG 70 - 120: 0 units CBG 121 - 150: 2 units CBG 151 - 200: 3 units CBG 201 - 250: 5 units CBG 251 - 300: 8 units CBG 301 - 350: 11 units CBG 351 - 400: 15 units CBG > 400: call MD., Disp: , Rfl:    insulin  aspart protamine- aspart (NOVOLOG  MIX 70/30) (70-30) 100 UNIT/ML injection, Inject 0.5 mLs (50 Units total) into the skin 2 (two) times daily with a meal., Disp: 10 mL, Rfl: 6   insulin  glargine (LANTUS ) 100 UNIT/ML injection, Inject 50 Units into the skin daily., Disp: , Rfl:    JARDIANCE 25 MG TABS tablet, 1 tablet Orally Once a day for diabetes for 90 days, Disp: , Rfl:    liraglutide  (VICTOZA )  18 MG/3ML SOPN, INJECT 1.8 MG INTO THE SKIN DAILY., Disp: 27 mL, Rfl: 1   Magnesium  Oxide 200 MG TABS, Take 2 tablets (400 mg total) by mouth daily. Take 2 tabs to = 400 mg, Disp: 30 tablet, Rfl: 0   methylPREDNISolone  (MEDROL  DOSEPAK) 4 MG TBPK tablet, Take as directed, Disp: 21 each, Rfl: 0   metoprolol  succinate (TOPROL -XL) 50 MG 24 hr tablet, Take 1.5 tablets (75 mg total) by mouth daily. Take with or immediately following a meal., Disp: , Rfl:    Multiple Vitamin (MULTIVITAMIN WITH MINERALS) TABS tablet, Take 1 tablet by mouth daily., Disp: , Rfl:    Nutritional Supplements (,FEEDING SUPPLEMENT, PROSOURCE PLUS) liquid, Take 30 mLs by mouth 2 (two) times daily between meals., Disp: , Rfl:     Nutritional Supplements (FEEDING SUPPLEMENT, KATE FARMS STANDARD 1.4,) LIQD liquid, Take 325 mLs by mouth daily., Disp: , Rfl:    pantoprazole  (PROTONIX ) 40 MG tablet, Take 1 tablet (40 mg total) by mouth daily., Disp: , Rfl:    polyethylene glycol (MIRALAX  / GLYCOLAX ) 17 g packet, Take 17 g by mouth daily., Disp: , Rfl:   Social History   Tobacco Use  Smoking Status Never  Smokeless Tobacco Never    Allergies  Allergen Reactions   Hctz [Hydrochlorothiazide ]     Hx of gout; uric acid 14.1 on 03/13/20 Acute gout L elbow 8/11-8/24/21   Objective:  There were no vitals filed for this visit. There is no height or weight on file to calculate BMI. Constitutional Well developed. Well nourished.  Vascular Dorsalis pedis pulses palpable bilaterally. Posterior tibial pulses palpable bilaterally. Capillary refill normal to all digits.  No cyanosis or clubbing noted. Pedal hair growth normal.  Neurologic Normal speech. Oriented to person, place, and time. Epicritic sensation to light touch grossly present bilaterally.  Dermatologic Nails well groomed and normal in appearance. No open wounds. No skin lesions.  Orthopedic: Pain on palpation left ankle pain with range of motion of the ankle joint pain on palpation with range of motion of the ankle joint deep intra-articular pain noted.  No pain at the Achilles tendon posterior tibial tendon peroneal tendon   Radiographs: None Assessment:   No diagnosis found.  Plan:  Patient was evaluated and treated and all questions answered.  Left ankle capsulitis - All questions and concerns were discussed with the patient extensive due to given the patient still has some residual pain therefore patient would like to focus more on conservative care that does not involve injection.  I discussed Medrol  Dosepak and meloxicam  I sent both of them to the pharmacy.  If there is no resolve meant we will discuss steroid injection during next visit.  She did  not want 1 today. No follow-ups on file.

## 2024-04-30 DIAGNOSIS — Z79899 Other long term (current) drug therapy: Secondary | ICD-10-CM | POA: Diagnosis not present

## 2024-04-30 DIAGNOSIS — Z86718 Personal history of other venous thrombosis and embolism: Secondary | ICD-10-CM | POA: Diagnosis not present

## 2024-04-30 DIAGNOSIS — E1140 Type 2 diabetes mellitus with diabetic neuropathy, unspecified: Secondary | ICD-10-CM | POA: Diagnosis not present

## 2024-04-30 DIAGNOSIS — Z125 Encounter for screening for malignant neoplasm of prostate: Secondary | ICD-10-CM | POA: Diagnosis not present

## 2024-04-30 DIAGNOSIS — N1831 Chronic kidney disease, stage 3a: Secondary | ICD-10-CM | POA: Diagnosis not present

## 2024-04-30 DIAGNOSIS — F70 Mild intellectual disabilities: Secondary | ICD-10-CM | POA: Diagnosis not present

## 2024-04-30 DIAGNOSIS — I872 Venous insufficiency (chronic) (peripheral): Secondary | ICD-10-CM | POA: Diagnosis not present

## 2024-05-14 DIAGNOSIS — N1831 Chronic kidney disease, stage 3a: Secondary | ICD-10-CM | POA: Diagnosis not present

## 2024-05-14 DIAGNOSIS — I872 Venous insufficiency (chronic) (peripheral): Secondary | ICD-10-CM | POA: Diagnosis not present

## 2024-05-14 DIAGNOSIS — E1140 Type 2 diabetes mellitus with diabetic neuropathy, unspecified: Secondary | ICD-10-CM | POA: Diagnosis not present

## 2024-05-14 DIAGNOSIS — F70 Mild intellectual disabilities: Secondary | ICD-10-CM | POA: Diagnosis not present

## 2024-05-21 ENCOUNTER — Other Ambulatory Visit: Payer: Self-pay | Admitting: Podiatry

## 2024-05-24 DIAGNOSIS — E1140 Type 2 diabetes mellitus with diabetic neuropathy, unspecified: Secondary | ICD-10-CM | POA: Diagnosis not present

## 2024-05-24 DIAGNOSIS — F70 Mild intellectual disabilities: Secondary | ICD-10-CM | POA: Diagnosis not present

## 2024-06-01 ENCOUNTER — Other Ambulatory Visit: Payer: Self-pay | Admitting: Podiatry

## 2024-06-04 ENCOUNTER — Ambulatory Visit: Admitting: Podiatry

## 2024-06-18 ENCOUNTER — Ambulatory Visit: Admitting: Podiatry

## 2024-07-01 ENCOUNTER — Ambulatory Visit: Admitting: Podiatry

## 2024-07-01 DIAGNOSIS — F70 Mild intellectual disabilities: Secondary | ICD-10-CM | POA: Diagnosis not present

## 2024-07-01 DIAGNOSIS — E1140 Type 2 diabetes mellitus with diabetic neuropathy, unspecified: Secondary | ICD-10-CM | POA: Diagnosis not present

## 2024-07-05 ENCOUNTER — Encounter: Payer: Self-pay | Admitting: Podiatry

## 2024-07-05 ENCOUNTER — Ambulatory Visit (INDEPENDENT_AMBULATORY_CARE_PROVIDER_SITE_OTHER): Admitting: Podiatry

## 2024-07-05 ENCOUNTER — Other Ambulatory Visit: Payer: Self-pay | Admitting: Podiatry

## 2024-07-05 DIAGNOSIS — B351 Tinea unguium: Secondary | ICD-10-CM

## 2024-07-05 DIAGNOSIS — M79675 Pain in left toe(s): Secondary | ICD-10-CM

## 2024-07-05 DIAGNOSIS — M79674 Pain in right toe(s): Secondary | ICD-10-CM

## 2024-07-05 DIAGNOSIS — E1142 Type 2 diabetes mellitus with diabetic polyneuropathy: Secondary | ICD-10-CM

## 2024-07-05 NOTE — Progress Notes (Signed)
 This patient returns to my office for at risk foot care.  This patient requires this care by a professional since this patient will be at risk due to having diabetes  and kidney disease.  This patient is unable to cut nails himself since the patient cannot reach his nails.These nails are painful walking and wearing shoes.  This patient presents for at risk foot care today.  General Appearance  Alert, conversant and in no acute stress.  Vascular  Dorsalis pedis and posterior tibial  pulses are  not palpable  due to swelling. bilaterally.  Capillary return is within normal limits  bilaterally. Temperature is within normal limits  bilaterally.  Neurologic  Senn-Weinstein monofilament wire test within normal limits  bilaterally. Muscle power within normal limits bilaterally.  Nails Thick disfigured discolored nails with subungual debris  from hallux to fifth toes bilaterally. No evidence of bacterial infection or drainage bilateral.  Orthopedic  No limitations of motion  feet .  No crepitus or effusions noted.  No bony pathology or digital deformities noted.  Skin  normotropic skin with no porokeratosis noted bilaterally.  No signs of infections or ulcers noted.     Onychomycosis  Pain in right toes  Pain in left toes  Consent was obtained for treatment procedures.   Mechanical debridement of nails 1-5  bilaterally performed with a nail nipper.  Filed with dremel without incident.    Return office visit      3 mos.               Told patient to return for periodic foot care and evaluation due to potential at risk complications.   Cordella Bold DPM

## 2024-08-02 ENCOUNTER — Other Ambulatory Visit: Payer: Self-pay | Admitting: Podiatry

## 2024-08-06 DIAGNOSIS — M109 Gout, unspecified: Secondary | ICD-10-CM | POA: Diagnosis not present

## 2024-08-06 DIAGNOSIS — F70 Mild intellectual disabilities: Secondary | ICD-10-CM | POA: Diagnosis not present

## 2024-08-06 DIAGNOSIS — E1140 Type 2 diabetes mellitus with diabetic neuropathy, unspecified: Secondary | ICD-10-CM | POA: Diagnosis not present

## 2024-08-06 DIAGNOSIS — E7800 Pure hypercholesterolemia, unspecified: Secondary | ICD-10-CM | POA: Diagnosis not present

## 2024-08-06 DIAGNOSIS — K219 Gastro-esophageal reflux disease without esophagitis: Secondary | ICD-10-CM | POA: Diagnosis not present

## 2024-08-11 ENCOUNTER — Other Ambulatory Visit: Payer: Self-pay | Admitting: Podiatry

## 2024-08-11 ENCOUNTER — Telehealth: Payer: Self-pay

## 2024-08-11 NOTE — Telephone Encounter (Signed)
 Patient was identified as falling into the [facility] [place] Measure - Diabetes.   Patient was: Patient is not currently using our practice.

## 2024-08-13 DIAGNOSIS — Z9109 Other allergy status, other than to drugs and biological substances: Secondary | ICD-10-CM | POA: Diagnosis not present

## 2024-08-13 DIAGNOSIS — I872 Venous insufficiency (chronic) (peripheral): Secondary | ICD-10-CM | POA: Diagnosis not present

## 2024-08-13 DIAGNOSIS — Z86718 Personal history of other venous thrombosis and embolism: Secondary | ICD-10-CM | POA: Diagnosis not present

## 2024-08-13 DIAGNOSIS — I10 Essential (primary) hypertension: Secondary | ICD-10-CM | POA: Diagnosis not present

## 2024-08-13 DIAGNOSIS — F70 Mild intellectual disabilities: Secondary | ICD-10-CM | POA: Diagnosis not present

## 2024-08-13 DIAGNOSIS — E1140 Type 2 diabetes mellitus with diabetic neuropathy, unspecified: Secondary | ICD-10-CM | POA: Diagnosis not present

## 2024-08-13 DIAGNOSIS — E7800 Pure hypercholesterolemia, unspecified: Secondary | ICD-10-CM | POA: Diagnosis not present

## 2024-10-04 ENCOUNTER — Encounter: Payer: Self-pay | Admitting: Podiatry

## 2024-10-04 ENCOUNTER — Ambulatory Visit (INDEPENDENT_AMBULATORY_CARE_PROVIDER_SITE_OTHER): Admitting: Podiatry

## 2024-10-04 DIAGNOSIS — B351 Tinea unguium: Secondary | ICD-10-CM | POA: Diagnosis not present

## 2024-10-04 DIAGNOSIS — E1142 Type 2 diabetes mellitus with diabetic polyneuropathy: Secondary | ICD-10-CM

## 2024-10-04 DIAGNOSIS — M79675 Pain in left toe(s): Secondary | ICD-10-CM | POA: Diagnosis not present

## 2024-10-04 DIAGNOSIS — M79674 Pain in right toe(s): Secondary | ICD-10-CM | POA: Diagnosis not present

## 2024-10-04 NOTE — Progress Notes (Signed)
 This patient returns to my office for at risk foot care.  This patient requires this care by a professional since this patient will be at risk due to having diabetes  and kidney disease.  This patient is unable to cut nails himself since the patient cannot reach his nails.These nails are painful walking and wearing shoes.  This patient presents for at risk foot care today.  General Appearance  Alert, conversant and in no acute stress.  Vascular  Dorsalis pedis and posterior tibial  pulses are  not palpable  due to swelling. bilaterally.  Capillary return is within normal limits  bilaterally. Temperature is within normal limits  bilaterally.  Neurologic  Senn-Weinstein monofilament wire test within normal limits  bilaterally. Muscle power within normal limits bilaterally.  Nails Thick disfigured discolored nails with subungual debris  from hallux to fifth toes bilaterally. No evidence of bacterial infection or drainage bilateral.  Orthopedic  No limitations of motion  feet .  No crepitus or effusions noted.  No bony pathology or digital deformities noted.  Skin  normotropic skin with no porokeratosis noted bilaterally.  No signs of infections or ulcers noted.     Onychomycosis  Pain in right toes  Pain in left toes  Consent was obtained for treatment procedures.   Mechanical debridement of nails 1-5  bilaterally performed with a nail nipper.  Filed with dremel without incident.    Return office visit      3 mos.               Told patient to return for periodic foot care and evaluation due to potential at risk complications.   Cordella Bold DPM

## 2024-10-18 DIAGNOSIS — E1140 Type 2 diabetes mellitus with diabetic neuropathy, unspecified: Secondary | ICD-10-CM | POA: Diagnosis not present

## 2024-10-18 DIAGNOSIS — F70 Mild intellectual disabilities: Secondary | ICD-10-CM | POA: Diagnosis not present

## 2025-01-03 ENCOUNTER — Ambulatory Visit: Admitting: Podiatry
